# Patient Record
Sex: Female | Born: 1945
Health system: Southern US, Community
[De-identification: ages and names within clinical notes are randomized; demographics above are authoritative.]

## PROBLEM LIST (undated history)

## (undated) DIAGNOSIS — T7840XA Allergy, unspecified, initial encounter: Secondary | ICD-10-CM

## (undated) DIAGNOSIS — I10 Essential (primary) hypertension: Secondary | ICD-10-CM

## (undated) DIAGNOSIS — R0781 Pleurodynia: Secondary | ICD-10-CM

## (undated) DIAGNOSIS — J449 Chronic obstructive pulmonary disease, unspecified: Secondary | ICD-10-CM

## (undated) DIAGNOSIS — M2550 Pain in unspecified joint: Secondary | ICD-10-CM

## (undated) DIAGNOSIS — E039 Hypothyroidism, unspecified: Secondary | ICD-10-CM

## (undated) DIAGNOSIS — K5900 Constipation, unspecified: Secondary | ICD-10-CM

## (undated) DIAGNOSIS — H269 Unspecified cataract: Secondary | ICD-10-CM

## (undated) DIAGNOSIS — M797 Fibromyalgia: Secondary | ICD-10-CM

## (undated) DIAGNOSIS — S62109A Fracture of unspecified carpal bone, unspecified wrist, initial encounter for closed fracture: Secondary | ICD-10-CM

## (undated) DIAGNOSIS — H409 Unspecified glaucoma: Secondary | ICD-10-CM

## (undated) DIAGNOSIS — M549 Dorsalgia, unspecified: Secondary | ICD-10-CM

## (undated) DIAGNOSIS — F419 Anxiety disorder, unspecified: Secondary | ICD-10-CM

## (undated) DIAGNOSIS — K219 Gastro-esophageal reflux disease without esophagitis: Secondary | ICD-10-CM

## (undated) DIAGNOSIS — E7800 Pure hypercholesterolemia, unspecified: Secondary | ICD-10-CM

## (undated) DIAGNOSIS — M1990 Unspecified osteoarthritis, unspecified site: Secondary | ICD-10-CM

## (undated) DIAGNOSIS — M7989 Other specified soft tissue disorders: Secondary | ICD-10-CM

## (undated) DIAGNOSIS — M810 Age-related osteoporosis without current pathological fracture: Secondary | ICD-10-CM

## (undated) DIAGNOSIS — L718 Other rosacea: Secondary | ICD-10-CM

## (undated) DIAGNOSIS — H189 Unspecified disorder of cornea: Secondary | ICD-10-CM

## (undated) DIAGNOSIS — K635 Polyp of colon: Secondary | ICD-10-CM

## (undated) DIAGNOSIS — E785 Hyperlipidemia, unspecified: Secondary | ICD-10-CM

## (undated) HISTORY — DX: Fracture of unspecified carpal bone, unspecified wrist, initial encounter for closed fracture: S62.109A

## (undated) HISTORY — DX: Anxiety disorder, unspecified: F41.9

## (undated) HISTORY — PX: POLYPECTOMY: SHX149

## (undated) HISTORY — PX: EYE SURGERY: SHX253

## (undated) HISTORY — DX: Pleurodynia: R07.81

## (undated) HISTORY — DX: Gastro-esophageal reflux disease without esophagitis: K21.9

## (undated) HISTORY — DX: Hyperlipidemia, unspecified: E78.5

## (undated) HISTORY — DX: Constipation, unspecified: K59.00

## (undated) HISTORY — DX: Polyp of colon: K63.5

## (undated) HISTORY — DX: Unspecified cataract: H26.9

## (undated) HISTORY — PX: UPPER GASTROINTESTINAL ENDOSCOPY: SHX188

## (undated) HISTORY — DX: Fibromyalgia: M79.7

## (undated) HISTORY — DX: Pain in unspecified joint: M25.50

## (undated) HISTORY — DX: Other specified soft tissue disorders: M79.89

## (undated) HISTORY — DX: Age-related osteoporosis without current pathological fracture: M81.0

## (undated) HISTORY — DX: Other rosacea: L71.8

## (undated) HISTORY — DX: Dorsalgia, unspecified: M54.9

## (undated) HISTORY — DX: Essential (primary) hypertension: I10

## (undated) HISTORY — DX: Unspecified glaucoma: H40.9

## (undated) HISTORY — DX: Allergy, unspecified, initial encounter: T78.40XA

## (undated) HISTORY — PX: SPINE SURGERY: SHX786

## (undated) HISTORY — DX: Unspecified osteoarthritis, unspecified site: M19.90

## (undated) HISTORY — DX: Hypothyroidism, unspecified: E03.9

## (undated) HISTORY — PX: COLONOSCOPY: SHX174

## (undated) HISTORY — DX: Unspecified disorder of cornea: H18.9

## (undated) HISTORY — DX: Pure hypercholesterolemia, unspecified: E78.00

## (undated) HISTORY — PX: FRACTURE SURGERY: SHX138

## (undated) HISTORY — PX: TONSILLECTOMY: SUR1361

---

## 1998-04-19 ENCOUNTER — Other Ambulatory Visit: Admission: RE | Admit: 1998-04-19 | Discharge: 1998-04-19 | Payer: Self-pay | Admitting: Gynecology

## 2004-02-24 ENCOUNTER — Ambulatory Visit: Payer: Self-pay | Admitting: Internal Medicine

## 2004-02-24 ENCOUNTER — Inpatient Hospital Stay (HOSPITAL_COMMUNITY): Admission: EM | Admit: 2004-02-24 | Discharge: 2004-02-28 | Payer: Self-pay

## 2004-02-24 ENCOUNTER — Ambulatory Visit: Payer: Self-pay | Admitting: Pulmonary Disease

## 2004-02-25 ENCOUNTER — Encounter (INDEPENDENT_AMBULATORY_CARE_PROVIDER_SITE_OTHER): Payer: Self-pay | Admitting: *Deleted

## 2004-02-27 ENCOUNTER — Encounter (INDEPENDENT_AMBULATORY_CARE_PROVIDER_SITE_OTHER): Payer: Self-pay | Admitting: Specialist

## 2004-03-09 ENCOUNTER — Ambulatory Visit: Payer: Self-pay | Admitting: Pulmonary Disease

## 2004-03-18 ENCOUNTER — Ambulatory Visit (HOSPITAL_COMMUNITY): Admission: RE | Admit: 2004-03-18 | Discharge: 2004-03-18 | Payer: Self-pay | Admitting: Pulmonary Disease

## 2004-03-18 ENCOUNTER — Ambulatory Visit: Payer: Self-pay | Admitting: Pulmonary Disease

## 2004-03-24 ENCOUNTER — Ambulatory Visit: Payer: Self-pay | Admitting: Pulmonary Disease

## 2004-04-14 ENCOUNTER — Ambulatory Visit: Payer: Self-pay | Admitting: Pulmonary Disease

## 2004-05-26 ENCOUNTER — Ambulatory Visit: Payer: Self-pay | Admitting: Pulmonary Disease

## 2004-05-27 ENCOUNTER — Ambulatory Visit (HOSPITAL_COMMUNITY): Admission: RE | Admit: 2004-05-27 | Discharge: 2004-05-27 | Payer: Self-pay | Admitting: Pulmonary Disease

## 2004-05-27 ENCOUNTER — Ambulatory Visit: Payer: Self-pay | Admitting: Pulmonary Disease

## 2004-06-10 ENCOUNTER — Ambulatory Visit: Payer: Self-pay | Admitting: Pulmonary Disease

## 2004-06-21 ENCOUNTER — Ambulatory Visit (HOSPITAL_COMMUNITY): Admission: RE | Admit: 2004-06-21 | Discharge: 2004-06-21 | Payer: Self-pay | Admitting: Pulmonary Disease

## 2004-06-21 ENCOUNTER — Ambulatory Visit: Payer: Self-pay | Admitting: Pulmonary Disease

## 2004-06-27 ENCOUNTER — Encounter (INDEPENDENT_AMBULATORY_CARE_PROVIDER_SITE_OTHER): Payer: Self-pay | Admitting: *Deleted

## 2004-06-27 ENCOUNTER — Inpatient Hospital Stay (HOSPITAL_COMMUNITY): Admission: RE | Admit: 2004-06-27 | Discharge: 2004-06-30 | Payer: Self-pay | Admitting: Thoracic Surgery

## 2004-06-28 ENCOUNTER — Ambulatory Visit: Payer: Self-pay | Admitting: Internal Medicine

## 2004-07-06 ENCOUNTER — Encounter: Admission: RE | Admit: 2004-07-06 | Discharge: 2004-07-06 | Payer: Self-pay | Admitting: Thoracic Surgery

## 2004-07-13 ENCOUNTER — Ambulatory Visit: Payer: Self-pay | Admitting: Pulmonary Disease

## 2004-07-27 ENCOUNTER — Encounter: Admission: RE | Admit: 2004-07-27 | Discharge: 2004-07-27 | Payer: Self-pay | Admitting: Thoracic Surgery

## 2004-08-30 ENCOUNTER — Ambulatory Visit: Payer: Self-pay | Admitting: Internal Medicine

## 2004-09-15 ENCOUNTER — Ambulatory Visit: Payer: Self-pay | Admitting: Internal Medicine

## 2004-10-04 ENCOUNTER — Ambulatory Visit: Payer: Self-pay | Admitting: Pulmonary Disease

## 2004-10-20 ENCOUNTER — Other Ambulatory Visit: Admission: RE | Admit: 2004-10-20 | Discharge: 2004-10-20 | Payer: Self-pay | Admitting: Obstetrics and Gynecology

## 2004-12-19 ENCOUNTER — Ambulatory Visit: Payer: Self-pay | Admitting: Pulmonary Disease

## 2005-01-10 ENCOUNTER — Ambulatory Visit: Payer: Self-pay | Admitting: Pulmonary Disease

## 2005-01-12 ENCOUNTER — Ambulatory Visit: Payer: Self-pay | Admitting: Pulmonary Disease

## 2005-02-15 ENCOUNTER — Ambulatory Visit: Payer: Self-pay | Admitting: Internal Medicine

## 2005-05-24 ENCOUNTER — Ambulatory Visit: Payer: Self-pay | Admitting: Internal Medicine

## 2005-06-26 ENCOUNTER — Ambulatory Visit (HOSPITAL_COMMUNITY): Admission: RE | Admit: 2005-06-26 | Discharge: 2005-06-26 | Payer: Self-pay | Admitting: Orthopedic Surgery

## 2005-07-12 ENCOUNTER — Ambulatory Visit: Payer: Self-pay | Admitting: Pulmonary Disease

## 2005-07-28 ENCOUNTER — Ambulatory Visit: Payer: Self-pay | Admitting: Internal Medicine

## 2005-08-17 ENCOUNTER — Ambulatory Visit: Payer: Self-pay | Admitting: Pulmonary Disease

## 2005-09-20 ENCOUNTER — Ambulatory Visit: Payer: Self-pay | Admitting: Internal Medicine

## 2005-09-29 ENCOUNTER — Encounter: Admission: RE | Admit: 2005-09-29 | Discharge: 2005-09-29 | Payer: Self-pay | Admitting: Internal Medicine

## 2005-10-17 ENCOUNTER — Ambulatory Visit: Payer: Self-pay | Admitting: Internal Medicine

## 2005-10-31 ENCOUNTER — Encounter: Admission: RE | Admit: 2005-10-31 | Discharge: 2005-10-31 | Payer: Self-pay

## 2005-11-08 ENCOUNTER — Other Ambulatory Visit: Admission: RE | Admit: 2005-11-08 | Discharge: 2005-11-08 | Payer: Self-pay | Admitting: Gynecology

## 2005-11-23 ENCOUNTER — Encounter: Admission: RE | Admit: 2005-11-23 | Discharge: 2005-11-23 | Payer: Self-pay | Admitting: Gynecology

## 2005-12-05 ENCOUNTER — Ambulatory Visit (HOSPITAL_COMMUNITY): Admission: RE | Admit: 2005-12-05 | Discharge: 2005-12-05 | Payer: Self-pay | Admitting: *Deleted

## 2005-12-05 ENCOUNTER — Encounter (INDEPENDENT_AMBULATORY_CARE_PROVIDER_SITE_OTHER): Payer: Self-pay | Admitting: *Deleted

## 2006-01-05 ENCOUNTER — Ambulatory Visit: Payer: Self-pay | Admitting: Internal Medicine

## 2006-02-02 ENCOUNTER — Ambulatory Visit: Payer: Self-pay | Admitting: Pulmonary Disease

## 2006-02-21 ENCOUNTER — Ambulatory Visit: Payer: Self-pay | Admitting: Internal Medicine

## 2006-02-21 LAB — CONVERTED CEMR LAB
ALT: 14 units/L (ref 0–40)
AST: 15 units/L (ref 0–37)
Albumin: 3.5 g/dL (ref 3.5–5.2)
Alkaline Phosphatase: 52 units/L (ref 39–117)
BUN: 10 mg/dL (ref 6–23)
CO2: 32 meq/L (ref 19–32)
Calcium: 9.2 mg/dL (ref 8.4–10.5)
Chloride: 106 meq/L (ref 96–112)
Chol/HDL Ratio, serum: 3.6
Cholesterol: 190 mg/dL (ref 0–200)
Creatinine, Ser: 1 mg/dL (ref 0.4–1.2)
GFR calc non Af Amer: 60 mL/min
Glomerular Filtration Rate, Af Am: 73 mL/min/{1.73_m2}
Glucose, Bld: 71 mg/dL (ref 70–99)
HDL: 53.5 mg/dL (ref >39.0)
LDL Cholesterol: 110 mg/dL — ABNORMAL HIGH (ref 0–99)
Potassium: 3.7 meq/L (ref 3.5–5.1)
Sodium: 143 meq/L (ref 135–145)
Total Bilirubin: 0.8 mg/dL (ref 0.3–1.2)
Total Protein: 6.5 g/dL (ref 6.0–8.3)
Triglyceride fasting, serum: 133 mg/dL (ref 0–149)
VLDL: 27 mg/dL (ref 0–40)

## 2006-03-06 ENCOUNTER — Emergency Department (HOSPITAL_COMMUNITY): Admission: EM | Admit: 2006-03-06 | Discharge: 2006-03-06 | Payer: Self-pay | Admitting: Emergency Medicine

## 2006-03-09 ENCOUNTER — Ambulatory Visit: Payer: Self-pay | Admitting: Internal Medicine

## 2006-05-15 DIAGNOSIS — Z860100 Personal history of colon polyps, unspecified: Secondary | ICD-10-CM | POA: Insufficient documentation

## 2006-05-15 DIAGNOSIS — R079 Chest pain, unspecified: Secondary | ICD-10-CM

## 2006-05-15 DIAGNOSIS — E039 Hypothyroidism, unspecified: Secondary | ICD-10-CM | POA: Insufficient documentation

## 2006-05-15 DIAGNOSIS — G8929 Other chronic pain: Secondary | ICD-10-CM | POA: Insufficient documentation

## 2006-10-23 ENCOUNTER — Encounter: Payer: Self-pay | Admitting: Internal Medicine

## 2006-11-07 ENCOUNTER — Ambulatory Visit: Payer: Self-pay | Admitting: Internal Medicine

## 2006-11-13 ENCOUNTER — Other Ambulatory Visit: Admission: RE | Admit: 2006-11-13 | Discharge: 2006-11-13 | Payer: Self-pay | Admitting: Gynecology

## 2006-11-20 ENCOUNTER — Telehealth (INDEPENDENT_AMBULATORY_CARE_PROVIDER_SITE_OTHER): Payer: Self-pay | Admitting: *Deleted

## 2006-11-26 ENCOUNTER — Encounter: Admission: RE | Admit: 2006-11-26 | Discharge: 2006-11-26 | Payer: Self-pay | Admitting: Gynecology

## 2007-03-04 ENCOUNTER — Ambulatory Visit: Payer: Self-pay | Admitting: Internal Medicine

## 2007-03-04 DIAGNOSIS — G43009 Migraine without aura, not intractable, without status migrainosus: Secondary | ICD-10-CM | POA: Insufficient documentation

## 2007-03-08 LAB — CONVERTED CEMR LAB
BUN: 11 mg/dL (ref 6–23)
Basophils Relative: 0.8 % (ref 0.0–1.0)
CO2: 29 meq/L (ref 19–32)
Creatinine, Ser: 0.8 mg/dL (ref 0.4–1.2)
Direct LDL: 92 mg/dL
Eosinophils Relative: 1 % (ref 0.0–5.0)
GFR calc Af Amer: 94 mL/min
Glucose, Bld: 75 mg/dL (ref 70–99)
HCT: 40.5 % (ref 36.0–46.0)
Hemoglobin: 13.6 g/dL (ref 12.0–15.0)
Lymphocytes Relative: 21.9 % (ref 12.0–46.0)
MCHC: 33.6 g/dL (ref 30.0–36.0)
MCV: 94.3 fL (ref 78.0–100.0)
Monocytes Absolute: 0.8 10*3/uL — ABNORMAL HIGH (ref 0.2–0.7)
Monocytes Relative: 10.5 % (ref 3.0–11.0)
Neutrophils Relative %: 65.8 % (ref 43.0–77.0)
Potassium: 3.5 meq/L (ref 3.5–5.1)
RDW: 12.8 % (ref 11.5–14.6)
TSH: 0.83 microintl units/mL (ref 0.35–5.50)
Total CHOL/HDL Ratio: 3.6
VLDL: 42 mg/dL — ABNORMAL HIGH (ref 0–40)

## 2007-03-13 ENCOUNTER — Telehealth (INDEPENDENT_AMBULATORY_CARE_PROVIDER_SITE_OTHER): Payer: Self-pay | Admitting: *Deleted

## 2007-09-09 ENCOUNTER — Encounter: Payer: Self-pay | Admitting: Internal Medicine

## 2007-10-29 ENCOUNTER — Ambulatory Visit: Payer: Self-pay | Admitting: Internal Medicine

## 2007-10-29 ENCOUNTER — Ambulatory Visit: Payer: Self-pay | Admitting: Cardiology

## 2007-10-29 DIAGNOSIS — F329 Major depressive disorder, single episode, unspecified: Secondary | ICD-10-CM | POA: Insufficient documentation

## 2007-10-29 LAB — CONVERTED CEMR LAB
Bilirubin Urine: NEGATIVE
Glucose, Urine, Semiquant: NEGATIVE
pH: 5

## 2007-10-30 ENCOUNTER — Telehealth: Payer: Self-pay | Admitting: Internal Medicine

## 2007-10-30 ENCOUNTER — Encounter: Payer: Self-pay | Admitting: Internal Medicine

## 2007-10-31 ENCOUNTER — Encounter: Payer: Self-pay | Admitting: Internal Medicine

## 2007-10-31 LAB — CONVERTED CEMR LAB: RBC / HPF: NONE SEEN

## 2007-11-11 ENCOUNTER — Telehealth (INDEPENDENT_AMBULATORY_CARE_PROVIDER_SITE_OTHER): Payer: Self-pay | Admitting: *Deleted

## 2007-11-21 ENCOUNTER — Encounter: Payer: Self-pay | Admitting: Internal Medicine

## 2007-11-21 LAB — CONVERTED CEMR LAB
Cholesterol: 201 mg/dL
Total CHOL/HDL Ratio: 3.5

## 2007-11-25 ENCOUNTER — Encounter: Payer: Self-pay | Admitting: Internal Medicine

## 2007-11-27 ENCOUNTER — Encounter: Admission: RE | Admit: 2007-11-27 | Discharge: 2007-11-27 | Payer: Self-pay | Admitting: Gynecology

## 2007-11-28 ENCOUNTER — Telehealth (INDEPENDENT_AMBULATORY_CARE_PROVIDER_SITE_OTHER): Payer: Self-pay | Admitting: *Deleted

## 2007-12-06 ENCOUNTER — Ambulatory Visit: Payer: Self-pay | Admitting: Internal Medicine

## 2007-12-11 ENCOUNTER — Telehealth: Payer: Self-pay | Admitting: Internal Medicine

## 2007-12-11 LAB — CONVERTED CEMR LAB
AST: 14 units/L (ref 0–37)
Albumin: 4.3 g/dL (ref 3.5–5.2)
Anti Nuclear Antibody(ANA): NEGATIVE
Bilirubin, Direct: 0.1 mg/dL (ref 0.0–0.3)
Sed Rate: 20 mm/hr (ref 0–22)
TSH: 1.271 microintl units/mL (ref 0.350–4.50)

## 2008-01-22 ENCOUNTER — Ambulatory Visit: Payer: Self-pay | Admitting: Internal Medicine

## 2008-02-10 ENCOUNTER — Encounter: Payer: Self-pay | Admitting: Internal Medicine

## 2008-02-25 ENCOUNTER — Encounter: Payer: Self-pay | Admitting: Internal Medicine

## 2008-04-09 ENCOUNTER — Ambulatory Visit: Payer: Self-pay | Admitting: Internal Medicine

## 2008-04-10 HISTORY — PX: OTHER SURGICAL HISTORY: SHX169

## 2008-04-20 ENCOUNTER — Encounter (INDEPENDENT_AMBULATORY_CARE_PROVIDER_SITE_OTHER): Payer: Self-pay | Admitting: *Deleted

## 2008-04-20 LAB — CONVERTED CEMR LAB
Basophils Absolute: 0.1 10*3/uL (ref 0.0–0.1)
Creatinine, Ser: 0.9 mg/dL (ref 0.4–1.2)
Eosinophils Absolute: 0.2 10*3/uL (ref 0.0–0.7)
Glucose, Bld: 81 mg/dL (ref 70–99)
HCT: 37 % (ref 36.0–46.0)
HDL: 57.3 mg/dL (ref >39.0)
LDL Cholesterol: 106 mg/dL — ABNORMAL HIGH (ref 0–99)
Lymphocytes Relative: 26.2 % (ref 12.0–46.0)
MCHC: 33.8 g/dL (ref 30.0–36.0)
Monocytes Absolute: 1 10*3/uL (ref 0.1–1.0)
Neutro Abs: 5.3 10*3/uL (ref 1.4–7.7)
Platelets: 269 10*3/uL (ref 150–400)
Potassium: 3.7 meq/L (ref 3.5–5.1)
RBC: 4.16 M/uL (ref 3.87–5.11)
Sodium: 145 meq/L (ref 135–145)
VLDL: 31 mg/dL (ref 0–40)
WBC: 9 10*3/uL (ref 4.5–10.5)

## 2008-07-29 ENCOUNTER — Encounter: Payer: Self-pay | Admitting: Internal Medicine

## 2008-07-29 LAB — CONVERTED CEMR LAB
ALT: 12 units/L
Calcium: 10.6 mg/dL
TSH: 0.2 microintl units/mL

## 2008-08-13 ENCOUNTER — Encounter: Payer: Self-pay | Admitting: Internal Medicine

## 2008-09-30 ENCOUNTER — Ambulatory Visit: Payer: Self-pay | Admitting: Internal Medicine

## 2008-09-30 DIAGNOSIS — D179 Benign lipomatous neoplasm, unspecified: Secondary | ICD-10-CM | POA: Insufficient documentation

## 2008-10-28 ENCOUNTER — Telehealth (INDEPENDENT_AMBULATORY_CARE_PROVIDER_SITE_OTHER): Payer: Self-pay | Admitting: *Deleted

## 2008-11-10 ENCOUNTER — Encounter: Payer: Self-pay | Admitting: Internal Medicine

## 2008-11-16 ENCOUNTER — Ambulatory Visit: Payer: Self-pay | Admitting: Internal Medicine

## 2008-11-20 ENCOUNTER — Encounter (INDEPENDENT_AMBULATORY_CARE_PROVIDER_SITE_OTHER): Payer: Self-pay | Admitting: *Deleted

## 2008-11-20 LAB — CONVERTED CEMR LAB
Cholesterol: 167 mg/dL (ref 0–200)
Total CHOL/HDL Ratio: 4
Triglycerides: 128 mg/dL (ref 0.0–149.0)
VLDL: 25.6 mg/dL (ref 0.0–40.0)

## 2008-11-24 ENCOUNTER — Ambulatory Visit: Payer: Self-pay | Admitting: Gastroenterology

## 2008-11-27 ENCOUNTER — Ambulatory Visit (HOSPITAL_BASED_OUTPATIENT_CLINIC_OR_DEPARTMENT_OTHER): Admission: RE | Admit: 2008-11-27 | Discharge: 2008-11-27 | Payer: Self-pay | Admitting: Gynecology

## 2008-11-27 ENCOUNTER — Ambulatory Visit: Payer: Self-pay | Admitting: Diagnostic Radiology

## 2008-11-30 ENCOUNTER — Ambulatory Visit: Payer: Self-pay | Admitting: Gastroenterology

## 2008-11-30 ENCOUNTER — Encounter: Payer: Self-pay | Admitting: Gastroenterology

## 2008-12-09 ENCOUNTER — Encounter: Payer: Self-pay | Admitting: Gastroenterology

## 2009-01-29 ENCOUNTER — Ambulatory Visit: Payer: Self-pay | Admitting: Internal Medicine

## 2009-01-29 DIAGNOSIS — M797 Fibromyalgia: Secondary | ICD-10-CM | POA: Insufficient documentation

## 2009-02-01 ENCOUNTER — Encounter (INDEPENDENT_AMBULATORY_CARE_PROVIDER_SITE_OTHER): Payer: Self-pay | Admitting: *Deleted

## 2009-02-10 ENCOUNTER — Encounter: Admission: RE | Admit: 2009-02-10 | Discharge: 2009-03-24 | Payer: Self-pay | Admitting: Internal Medicine

## 2009-02-12 ENCOUNTER — Encounter: Payer: Self-pay | Admitting: Internal Medicine

## 2009-03-10 DIAGNOSIS — L718 Other rosacea: Secondary | ICD-10-CM

## 2009-03-10 HISTORY — DX: Other rosacea: L71.8

## 2009-04-14 ENCOUNTER — Telehealth: Payer: Self-pay | Admitting: Internal Medicine

## 2009-05-07 ENCOUNTER — Telehealth (INDEPENDENT_AMBULATORY_CARE_PROVIDER_SITE_OTHER): Payer: Self-pay | Admitting: *Deleted

## 2009-05-20 ENCOUNTER — Telehealth (INDEPENDENT_AMBULATORY_CARE_PROVIDER_SITE_OTHER): Payer: Self-pay | Admitting: *Deleted

## 2009-05-26 ENCOUNTER — Ambulatory Visit: Payer: Self-pay | Admitting: Internal Medicine

## 2009-05-27 ENCOUNTER — Encounter: Payer: Self-pay | Admitting: Internal Medicine

## 2009-05-31 LAB — CONVERTED CEMR LAB
AST: 17 units/L (ref 0–37)
Albumin: 3.7 g/dL (ref 3.5–5.2)
Basophils Absolute: 0 10*3/uL (ref 0.0–0.1)
Basophils Relative: 0.7 % (ref 0.0–3.0)
Chloride: 105 meq/L (ref 96–112)
Creatinine, Ser: 0.6 mg/dL (ref 0.4–1.2)
Hemoglobin: 12.9 g/dL (ref 12.0–15.0)
Lymphocytes Relative: 25.2 % (ref 12.0–46.0)
Lymphs Abs: 1.3 10*3/uL (ref 0.7–4.0)
MCHC: 34.1 g/dL (ref 30.0–36.0)
Monocytes Relative: 9.4 % (ref 3.0–12.0)
Neutrophils Relative %: 62.3 % (ref 43.0–77.0)
RDW: 13.3 % (ref 11.5–14.6)
Sodium: 141 meq/L (ref 135–145)
WBC: 5.2 10*3/uL (ref 4.5–10.5)

## 2009-06-14 ENCOUNTER — Telehealth (INDEPENDENT_AMBULATORY_CARE_PROVIDER_SITE_OTHER): Payer: Self-pay | Admitting: *Deleted

## 2009-07-15 ENCOUNTER — Telehealth: Payer: Self-pay | Admitting: Internal Medicine

## 2009-10-12 ENCOUNTER — Encounter: Payer: Self-pay | Admitting: Internal Medicine

## 2009-10-13 ENCOUNTER — Telehealth: Payer: Self-pay | Admitting: Internal Medicine

## 2009-10-16 ENCOUNTER — Ambulatory Visit: Payer: Self-pay | Admitting: Diagnostic Radiology

## 2009-10-16 ENCOUNTER — Ambulatory Visit (HOSPITAL_BASED_OUTPATIENT_CLINIC_OR_DEPARTMENT_OTHER): Admission: RE | Admit: 2009-10-16 | Discharge: 2009-10-16 | Payer: Self-pay | Admitting: Orthopaedic Surgery

## 2009-10-16 ENCOUNTER — Encounter: Payer: Self-pay | Admitting: Internal Medicine

## 2009-10-26 ENCOUNTER — Ambulatory Visit: Payer: Self-pay | Admitting: Internal Medicine

## 2009-11-08 ENCOUNTER — Encounter: Payer: Self-pay | Admitting: Internal Medicine

## 2009-11-08 LAB — CONVERTED CEMR LAB: Pap Smear: NORMAL

## 2009-11-17 ENCOUNTER — Encounter: Admission: RE | Admit: 2009-11-17 | Discharge: 2010-02-15 | Payer: Self-pay | Admitting: Orthopaedic Surgery

## 2009-12-10 ENCOUNTER — Ambulatory Visit (HOSPITAL_BASED_OUTPATIENT_CLINIC_OR_DEPARTMENT_OTHER): Admission: RE | Admit: 2009-12-10 | Discharge: 2009-12-10 | Payer: Self-pay | Admitting: Gynecology

## 2009-12-10 ENCOUNTER — Ambulatory Visit: Payer: Self-pay | Admitting: Diagnostic Radiology

## 2009-12-28 ENCOUNTER — Encounter: Payer: Self-pay | Admitting: Internal Medicine

## 2009-12-29 ENCOUNTER — Encounter: Payer: Self-pay | Admitting: Internal Medicine

## 2010-01-10 ENCOUNTER — Telehealth: Payer: Self-pay | Admitting: Internal Medicine

## 2010-02-07 ENCOUNTER — Ambulatory Visit: Payer: Self-pay | Admitting: Internal Medicine

## 2010-04-06 ENCOUNTER — Telehealth: Payer: Self-pay | Admitting: Internal Medicine

## 2010-04-11 ENCOUNTER — Encounter
Admission: RE | Admit: 2010-04-11 | Discharge: 2010-05-09 | Payer: Self-pay | Source: Home / Self Care | Attending: Orthopaedic Surgery | Admitting: Orthopaedic Surgery

## 2010-04-25 ENCOUNTER — Encounter (INDEPENDENT_AMBULATORY_CARE_PROVIDER_SITE_OTHER): Payer: Self-pay | Admitting: *Deleted

## 2010-04-25 ENCOUNTER — Telehealth: Payer: Self-pay | Admitting: Internal Medicine

## 2010-05-01 ENCOUNTER — Encounter: Payer: Self-pay | Admitting: Gynecology

## 2010-05-08 LAB — CONVERTED CEMR LAB
Creatinine, Ser: 0.72 mg/dL
Glucose, Bld: 129 mg/dL
Hemoglobin: 4.53 g/dL
Platelets: 61 10*3/uL
Potassium, serum: 4.2 mmol/L
Rhuematoid fact SerPl-aCnc: >20 intl units/mL
WBC, blood: 8 10*3/uL

## 2010-05-10 NOTE — Assessment & Plan Note (Signed)
Summary: CPX//PH   Vital Signs:  Patient profile:   65 year old female Height:      67 inches Weight:      156.2 pounds BMI:     24.55 Pulse rate:   70 / minute BP sitting:   120 / 80  Vitals Entered By: Shary Decamp (May 26, 2009 9:02 AM) CC: cpx - fasting, has gyn Is Patient Diabetic? No   History of Present Illness: dx w/ ocular rosacea, on abx indefinitely  Osteopenia-- holding boniva , took it x  many years, aches&pains  decreased by holding it?  Colonic polyps-- chart reviewed , had a cscope recently    Depression-- mood ok   fibromyalgia --  s/p PT , did help but it was expensive PT hurt his shoulder, s/p local njection which helped   Preventive Screening-Counseling & Management  Caffeine-Diet-Exercise     Does Patient Exercise: no      Drug Use:  no.    Current Medications (verified): 1)  Sumatriptan Succinate 100 Mg Tabs (Sumatriptan Succinate) .Marland Kitchen.. 1 By Mouth With Onset of Ha, Repeat in 2 Hours If Needed. No More Than 2 Tabs in A 24h Period 2)  Synthroid 88 Mcg Tabs (Levothyroxine Sodium) .Marland Kitchen.. 1 By Mouth Once Daily 3)  Vicodin 5-500 Mg Tabs (Hydrocodone-Acetaminophen) .... Take 1 Tablet By Mouth Every Six Hours 4)  Xanax 0.25 Mg Tabs (Alprazolam) .... Take 1 Tablet By Mouth Twice A Day As Needed 5)  Prilosec Otc 20 Mg  Tbec (Omeprazole Magnesium) .... Qd 6)  Aspir-Low 81 Mg  Tbec (Aspirin) 7)  Mvi 8)  Vit C, Cal - D 9)  Premarin 0.625 Mg/gm  Crea (Estrogens, Conjugated) .... Qwk 10)  Boniva 150 Mg  Tabs (Ibandronate Sodium) .... Hold Hold Q 4 Week 11)  Red Yeast Rice 12)  Ambien Cr 6.25 Mg Cr-Tabs (Zolpidem Tartrate) .Marland Kitchen.. 1 By Mouth At Bedtime 13)  Cranberry Pill 14)  Nortriptyline Hcl 50 Mg Caps (Nortriptyline Hcl) .Marland Kitchen.. 1 At Bedtime 15)  Doxycycline Hyclate 100 Mg Caps (Doxycycline Hyclate) .Marland Kitchen.. 1 By Mouth Once Daily  Allergies (verified): 1)  ! * Savella 2)  * Penicillins Group 3)  * Contrast Dye 4)  Fosamax (Alendronate Sodium)  Past  History:  Past Medical History: Hypothyroidism migraine HA Osteopenia Colonic polyps, C-scope 11-2005: Bx adenomatous polyps, next scope 2010 Chronic pleuritic CP, etiology unclear; has been eval by pulmonary and Rheumatology (autoinmune Dz?) Depression 6-09: CP, stress test @ [facility] (-)  fibromyalgia - dx by dr Phylliss Bob  dx w/ ocular rosacea (Dx 03-2009)  Past Surgical History: Reviewed history from 11/24/2008 and no changes required. Tonsillectomy   Family History: Reviewed history from 11/24/2008 and no changes required. unknown, patient adopted    Social History: Reviewed history from 11/24/2008 and no changes required. Married no biological children Retired, Marketing executive-- a lot better , continue to loose wt  exercise -- some  tobacco-- quit 2005 aprox ETOH-- rarely  Does Patient Exercise:  no Drug Use:  no  Review of Systems CV:  Denies chest pain or discomfort and swelling of feet. Resp:  Denies cough and shortness of breath. GI:  Denies bloody stools, nausea, and vomiting. GU:  Denies dysuria, hematuria, urinary frequency, and urinary hesitancy.  Physical Exam  General:  alert, well-developed, and well-nourished.   Neck:  no thyromegaly.   Lungs:  normal respiratory effort, no intercostal retractions, no accessory muscle use, and normal breath sounds.  Heart:  normal rate, regular rhythm, and no murmur.   Abdomen:  soft, non-tender, no distention, no masses, no guarding, and no rigidity.   Extremities:  no edema Psych:  Cognition and judgment appear intact. Alert and cooperative with normal attention span and concentration.  not anxious appearing and not depressed appearing.     Impression & Recommendations:  Problem # 1:  OSTEOPENIA (ICD-733.90) last bone density test 02/2008 by gynecology we were holding boniva to see if her aches and pains improve and apparently  they did  to some extent so  we are stopping boniva today  (she has  been taking it for many year , this would be a holiday) planning to get a bone density test at some point per gynecology  The following medications were removed from the medication list:    Boniva 150 Mg Tabs (Ibandronate sodium) ..... Hold hold q 4 week  Orders: Venipuncture (08657) TLB-BMP (Basic Metabolic Panel-BMET) (80048-METABOL)  Problem # 2:  HEALTH SCREENING (ICD-V70.0) TD 2007 Pneumonia shot 2006 per patient had a flu shot  Doing great with her diet, she continued to lose weight and being active   Cscope 11-2005:  polyps-adenomatous, repeated  colonoscopy 11/2008, next 2013  sees Gyn-- lomax Pap Smear:   Date:  01/08/2009   Results:  normal  Mammogram:   Date:  11/08/2008   Results:  normal   Problem # 3:  FIBROMYALGIA (ICD-729.1) currently well controlled, status post physical therapy, see HPI she is taking nortriptyline she will meet her new rheumatologist today Her updated medication list for this problem includes:    Vicodin 5-500 Mg Tabs (Hydrocodone-acetaminophen) .Marland Kitchen... Take 1 tablet by mouth every six hours    Aspir-low 81 Mg Tbec (Aspirin)  Orders: TLB-CBC Platelet - w/Differential (85025-CBCD) TLB-Hepatic/Liver Function Pnl (80076-HEPATIC)  Problem # 4:  DEPRESSION (ICD-311) well-controlled Her updated medication list for this problem includes:    Nortriptyline Hcl 50 Mg Caps (Nortriptyline hcl) .Marland Kitchen... 1 at bedtime    Xanax 0.25 Mg Tabs (Alprazolam) .Marland Kitchen... Take 1 tablet by mouth twice a day as needed  Problem # 5:  HYPOTHYROIDISM (ICD-244.9) at goal  Her updated medication list for this problem includes:    Synthroid 88 Mcg Tabs (Levothyroxine sodium) .Marland Kitchen... 1 by mouth once daily  Labs Reviewed: TSH: 1.59 (01/29/2009)    Chol: 167 (11/16/2008)   HDL: 42.70 (11/16/2008)   LDL: 99 (11/16/2008)   TG: 128.0 (11/16/2008)  Orders: TLB-TSH (Thyroid Stimulating Hormone) (84443-TSH)  Complete Medication List: 1)  Nortriptyline Hcl 50 Mg Caps (Nortriptyline  hcl) .Marland Kitchen.. 1 at bedtime 2)  Ambien Cr 6.25 Mg Cr-tabs (Zolpidem tartrate) .Marland Kitchen.. 1 by mouth at bedtime 3)  Sumatriptan Succinate 100 Mg Tabs (Sumatriptan succinate) .Marland Kitchen.. 1 by mouth with onset of ha, repeat in 2 hours if needed. no more than 2 tabs in a 24h period 4)  Synthroid 88 Mcg Tabs (Levothyroxine sodium) .Marland Kitchen.. 1 by mouth once daily 5)  Vicodin 5-500 Mg Tabs (Hydrocodone-acetaminophen) .... Take 1 tablet by mouth every six hours 6)  Xanax 0.25 Mg Tabs (Alprazolam) .... Take 1 tablet by mouth twice a day as needed 7)  Prilosec Otc 20 Mg Tbec (Omeprazole magnesium) .... Qd 8)  Aspir-low 81 Mg Tbec (Aspirin) 9)  Mvi  10)  Vit C, Cal - D  11)  Premarin 0.625 Mg/gm Crea (Estrogens, conjugated) .... Qwk 12)  Red Yeast Rice  13)  Cranberry Pill  14)  Doxycycline Hyclate 100 Mg Caps (Doxycycline hyclate) .Marland KitchenMarland KitchenMarland Kitchen  1 by mouth once daily  Patient Instructions: 1)  Please schedule a follow-up appointment in 6 months .    Preventive Care Screening  Pap Smear:    Date:  01/08/2009    Results:  normal   Mammogram:    Date:  11/08/2008    Results:  normal   Bone Density:    Date:  02/25/2008    Results:  osteopenia std dev  Prior Values:    Colonoscopy:  Location:  [facility].   (11/30/2008)    Last Tetanus Booster:  Td (02/08/2006)    Risk Factors:  Drug use:  no Alcohol use:  no Exercise:  no  PAP Smear History:     Date of Last PAP Smear:  01/08/2009    Results:  normal   Mammogram History:     Date of Last Mammogram:  11/08/2008    Results:  normal      Immunization History:  Tetanus/Td Immunization History:    Tetanus/Td:  Td (02/08/2006)  Influenza Immunization History:    Influenza:  Fluvax 3+ (01/29/2009)  Pneumovax Immunization History:    Pneumovax:  Pneumovax (05/11/2004)  H1N1 History:    H1N1 # 1:  H1N1 vaccine G code (04/09/2008)

## 2010-05-10 NOTE — Progress Notes (Signed)
Summary: REFILL REQUEST  Phone Note Refill Request Call back at [phone] Message from:  Pharmacy on October 13, 2009 4:09 PM  Refills Requested: Medication #1:  AMBIEN CR 6.25 MG CR-TABS 1 by mouth at bedtime   Dosage confirmed as above?Dosage Confirmed   Supply Requested: 1 month   Last Refilled: 04/14/2009  Medication #2:  VICODIN 5-500 MG TABS Take 1 tablet by mouth every six hours   Dosage confirmed as above?Dosage Confirmed   Supply Requested: 1 month   Last [facility]: [facility]14/[facility]  Next Appointment Scheduled: JULY 19TH 2011 Initial call taken by: Lavell Islam,  October 13, 2009 4:11 PM  Follow-up for Phone Call        last ov- 05/26/09. Army Fossa CMA  October 13, 2009 4:13 PM   Additional Follow-up for Phone Call Additional follow up Details #1::        ok ambien 30 , 6 Rf ok vicodin 100 and 2 Rf Shaul Trautman E. Kira Hartl MD  October 14, 2009 1:53 PM     Prescriptions: VICODIN 5-500 MG TABS (HYDROCODONE-ACETAMINOPHEN) Take 1 tablet by mouth every six hours  #100 x 2   Entered by:   Army Fossa CMA   Authorized by:   Nolon Rod. Hurshell Dino MD   Signed by:   Army Fossa CMA on 10/14/2009   Method used:   Printed then faxed to ...       [facility] [phone]* (retail)       [address]       [place], [place]  [address]       Ph: [phone]       Fax: [phone]   RxID:   [id number] AMBIEN CR 6.25 MG CR-TABS (ZOLPIDEM TARTRATE) 1 by mouth at bedtime  #30 x 6   Entered by:   Army Fossa CMA   Authorized by:   Nolon Rod. [place] Dube MD   Signed by:   Army Fossa CMA on 10/14/2009   Method used:   Printed then faxed to ...       [facility] [phone]* (retail)       [address]       [place], [place]  [address]       Ph: [phone]       Fax: [phone]   RxID:   [id number]

## 2010-05-10 NOTE — Progress Notes (Signed)
Summary: refill  Phone Note Refill Request Message from:  Fax from Pharmacy on January 10, 2010 4:06 PM  Refills Requested: Medication #1:  VICODIN 5-500 MG TABS Take 1 tablet by mouth every six hours [facility] [phone]  Initial call taken by: Okey Regal [place],  January 10, 2010 4:07 PM  Follow-up for Phone Call        last filled 10/14/09 100x 2 Follow-up by: Army Fossa CMA,  January 10, 2010 4:12 PM  Additional Follow-up for Phone Call Additional follow up Details #1::        okay #100 and 2 refills Additional Follow-up by: [facility] E. Paz MD,  January 11, 2010 3:07 PM    Prescriptions: VICODIN 5-500 MG TABS (HYDROCODONE-ACETAMINOPHEN) Take 1 tablet by mouth every six hours  #100 x 2   Entered by:   Army Fossa CMA   Authorized by:   Nolon Rod. Paz MD   Signed by:   Army Fossa CMA on 01/11/2010   Method used:   Printed then faxed to ...       [facility] [phone]* (retail)       [address]       [place], [place]  [address]       Ph: [phone]       Fax: [phone]   RxID:   [id number]

## 2010-05-10 NOTE — Assessment & Plan Note (Signed)
Summary: FLU SHOT//PH   Nurse Visit   Allergies: 1)  ! * Savella 2)  * Penicillins Group 3)  * Contrast Dye 4)  Fosamax (Alendronate Sodium)  Orders Added: 1)  Admin 1st Vaccine [90471] 2)  Flu Vaccine 74yrs + [81191] Flu Vaccine Consent Questions     Do you have a history of severe allergic reactions to this vaccine? no    Any prior history of allergic reactions to egg and/or gelatin? no    Do you have a sensitivity to the preservative Thimersol? no    Do you have a past history of Guillan-Barre Syndrome? no    Do you currently have an acute febrile illness? no    Have you ever had a severe reaction to latex? no    Vaccine information given and explained to patient? yes    Are you currently pregnant? no    Lot Number:[id number]   Exp Date:10/08/2010   Site Given  Left Deltoid IM .lbflu1

## 2010-05-10 NOTE — Progress Notes (Signed)
Summary: refill  Phone Note Refill Request Message from:  Fax from Pharmacy on July 15, 2009 11:30 AM  Refills Requested: Medication #1:  VICODIN 5-500 MG TABS Take 1 tablet by mouth every six hours [facility] [address] fax [phone]   Method Requested: Fax to Local Pharmacy Next Appointment Scheduled: no appt Initial call taken by: Barb Merino,  July 15, 2009 11:31 AM  Follow-up for Phone Call        last ov 05/26/09 rx'd #100 with 1 refill on 04/14/09 [facility]  July 15, 2009 2:49 PM  #100 and 2 RF Kendi Defalco E. Bree Heinzelman MD  July 16, 2009 8:25 AM     Prescriptions: VICODIN 5-500 MG TABS (HYDROCODONE-ACETAMINOPHEN) Take 1 tablet by mouth every six hours  #100 x 2   Entered by:   Shary Decamp   Authorized by:   Nolon Rod. Satrina Magallanes MD   Signed by:   Shary Decamp on 07/16/2009   Method used:   Printed then faxed to ...       [facility] [phone]* (retail)       [address]       [place], [place]  [address]       Ph: [phone]       Fax: [phone]   RxID:   [id number]

## 2010-05-10 NOTE — Progress Notes (Signed)
Summary:  prior auth NOT required  Phone Note Refill Request Message from:  [facility] Pharmacy [facility] [phone]  Refills Requested: Medication #1:  repeat in 2 hours if needed. No more than 2 tabs in a 24h period PRIOR AUTH [id number]  Initial call taken by: Barb Merino,  May 20, 2009 8:33 AM  Follow-up for Phone Call        prior Berkley Harvey is Not required, ins only allows #9 in a 30 day period,  per ins co pt filled  #9 on 05/18/09.spoke with pharmacist and informed of this. per Alena Bills pt has been informed of this.Kandice Hams  May 20, 2009 1:10 PM  Follow-up by: Kandice Hams,  May 20, 2009 1:10 PM

## 2010-05-10 NOTE — Progress Notes (Signed)
Summary: prior authorization NOT REQUIRED  Phone Note Refill Request Message from:  Fax from Pharmacy on [address] fax [phone]  Refills Requested: Medication #1:  SUMATRIPTAN SUCCINATE 100 MG TABS 1 by mouth with onset of HA prior authorization [phone]  Initial call taken by: Barb Merino,  May 07, 2009 4:58 PM  Follow-up for Phone Call        Per BC/BS pt can #9  IN A 30 DAY PERIOD, WITHOUT PRIOR AUTH-  [facility] Follow-up by: Kandice Hams,  May 10, 2009 2:33 PM

## 2010-05-10 NOTE — Assessment & Plan Note (Signed)
Summary: 6 month --ph   Vital Signs:  Patient profile:   65 year old female Weight:      150.50 pounds Pulse rate:   104 / minute Pulse rhythm:   regular BP sitting:   116 / 84  (left arm) Cuff size:   regular  Vitals Entered By: Army Fossa CMA (October 26, 2009 10:30 AM) CC: Follow up visit- Fasting Comments Discuss Glucose Levels.   History of Present Illness: routine office visit doing well diet better-- lost 6 pounds  ortho did some blood work, all normal including sed rate  to have shoulder surgery (Dr Cleophas Dunker)--- rotator cuff   ROS   denies chest pain or shortness of breath fibromyalgia symptoms well controlled Anxiety and depression well controlled Good medication compliance Things she's having nightmares from nortriptyline but as long as she takes Ambien she is okay  Current Medications (verified): 1)  Nortriptyline Hcl 50 Mg Caps (Nortriptyline Hcl) .Marland Kitchen.. 1 At Bedtime 2)  Ambien Cr 6.25 Mg Cr-Tabs (Zolpidem Tartrate) .Marland Kitchen.. 1 By Mouth At Bedtime 3)  Sumatriptan Succinate 100 Mg Tabs (Sumatriptan Succinate) .Marland Kitchen.. 1 By Mouth With Onset of Ha, Repeat in 2 Hours If Needed. No More Than 2 Tabs in A 24h Period 4)  Synthroid 88 Mcg Tabs (Levothyroxine Sodium) .Marland Kitchen.. 1 By Mouth Once Daily 5)  Vicodin 5-500 Mg Tabs (Hydrocodone-Acetaminophen) .... Take 1 Tablet By Mouth Every Six Hours 6)  Xanax 0.25 Mg Tabs (Alprazolam) .... Take 1 Tablet By Mouth Twice A Day As Needed 7)  Mvi 8)  Vit C, Cal - D 9)  Premarin 0.625 Mg/gm  Crea (Estrogens, Conjugated) .... Qwk 10)  Red Yeast Rice 11)  Cranberry Pill 12)  Doxycycline Hyclate 100 Mg Caps (Doxycycline Hyclate) .Marland Kitchen.. 1 By Mouth Once Daily  Allergies: 1)  ! * Savella 2)  * Penicillins Group 3)  * Contrast Dye 4)  Fosamax (Alendronate Sodium)  Past History:  Past Medical History: Hypothyroidism migraine HA Osteopenia Colonic polyps, C-scope 11-2005: Bx adenomatous polyps, next scope 2010 Chronic pleuritic CP, etiology  unclear; has been eval by pulmonary and Rheumatology (autoinmune Dz?) Depression 6-09: CP, stress test @ [facility] (-)  fibromyalgia - dx by dr Phylliss Bob  dx w/ ocular rosacea (Dx 03-2009)--- on doxycyclne   Past Surgical History: Reviewed history from 11/24/2008 and no changes required. Tonsillectomy   Social History: Reviewed history from 05/26/2009 and no changes required. Married no biological children Retired, Marketing executive-- a lot better , continue to loose wt  exercise -- some  tobacco-- quit 2005 aprox ETOH-- rarely   Physical Exam  General:  alert and well-developed.   Lungs:  normal respiratory effort, no intercostal retractions, no accessory muscle use, and normal breath sounds.   Heart:  normal rate, regular rhythm, and no murmur.   Psych:  Cognition and judgment appear intact. Alert and cooperative with normal attention span and concentration.  not anxious appearing and not depressed appearing.     Impression & Recommendations:  Problem # 1:  FIBROMYALGIA (ICD-729.1) well controlled  to meet Dr Dierdre Forth soon  no change  Continue with nortriptyline, things is giving her nightmares but as long as she takes Ambien she is okay    Aspir-low 81 Mg Tbec (Aspirin) Her updated medication list for this problem includes:    Vicodin 5-500 Mg Tabs (Hydrocodone-acetaminophen) .Marland Kitchen... Take 1 tablet by mouth every six hours  Problem # 2:  HYPOTHYROIDISM (ICD-244.9)  RF  Her updated medication  list for this problem includes:    Synthroid 88 Mcg Tabs (Levothyroxine sodium) .Marland Kitchen... 1 by mouth once daily  Labs Reviewed: TSH: 1.67 (05/26/2009)    Chol: 167 (11/16/2008)   HDL: 42.70 (11/16/2008)   LDL: 99 (11/16/2008)   TG: 128.0 (11/16/2008)  Orders: Venipuncture (84696) TLB-TSH (Thyroid Stimulating Hormone) (84443-TSH) Specimen Handling (29528)  Problem # 3:  pulse 104 initially recheck by me: 90  Complete Medication List: 1)  Nortriptyline Hcl 50 Mg Caps  (Nortriptyline hcl) .Marland Kitchen.. 1 at bedtime 2)  Ambien Cr 6.25 Mg Cr-tabs (Zolpidem tartrate) .Marland Kitchen.. 1 by mouth at bedtime 3)  Sumatriptan Succinate 100 Mg Tabs (Sumatriptan succinate) .Marland Kitchen.. 1 by mouth with onset of ha, repeat in 2 hours if needed. no more than 2 tabs in a 24h period 4)  Synthroid 88 Mcg Tabs (Levothyroxine sodium) .Marland Kitchen.. 1 by mouth once daily 5)  Vicodin 5-500 Mg Tabs (Hydrocodone-acetaminophen) .... Take 1 tablet by mouth every six hours 6)  Xanax 0.25 Mg Tabs (Alprazolam) .... Take 1 tablet by mouth twice a day as needed 7)  Mvi  8)  Vit C, Cal - D  9)  Premarin 0.625 Mg/gm Crea (Estrogens, conjugated) .... Qwk 10)  Red Yeast Rice  11)  Cranberry Pill  12)  Doxycycline Hyclate 100 Mg Caps (Doxycycline hyclate) .Marland Kitchen.. 1 by mouth once daily  Patient Instructions: 1)  please schedule your physical for 05-2010 Prescriptions: SYNTHROID 88 MCG TABS (LEVOTHYROXINE SODIUM) 1 by mouth once daily  #90 x 3   Entered and Authorized by:   Nolon Rod. Daniel Ritthaler MD   Signed by:   Nolon Rod. Abrham Maslowski MD on 10/26/2009   Method used:   Electronically to        [facility] [phone]* (retail)       [address]       [place], [place]  [address]       Ph: [phone]       Fax: [phone]   RxID:   [id number]

## 2010-05-10 NOTE — Consult Note (Signed)
Summary: [facility]  [facility]   Imported By: Lanelle Bal 06/02/2009 08:36:41  _____________________________________________________________________  External Attachment:    Type:   Image     Comment:   External Document

## 2010-05-10 NOTE — Progress Notes (Signed)
Summary: refill  Phone Note Refill Request Message from:  Fax from Pharmacy on [address] fax [phone]  hydrocodon-acetaminophen 5-500,zolpidem tart er 6.25mg   Initial call taken by: Barb Merino,  April 14, 2009 10:57 AM  Follow-up for Phone Call        Vicodin was rx'd #100 with 2 refills on 01/09/09;  ambien rx'd #30 with 6 refills on 09/30/08  Follow-up by: Shary Decamp,  April 14, 2009 11:10 AM  Additional Follow-up for Phone Call Additional follow up Details #1::        vicodin ok #100, 2 RF Ambien ok #30 and 6 RF Additional Follow-up by: Jesselle Laflamme E. Ranny Wiebelhaus MD,  April 14, 2009 12:53 PM    Additional Follow-up for Phone Call Additional follow up Details #2::    prescriptions were faxed to pharmacy NOT given to pt Follow-up by: Shary Decamp,  April 14, 2009 3:04 PM  Prescriptions: AMBIEN CR 6.25 MG CR-TABS (ZOLPIDEM TARTRATE) 1 by mouth at bedtime  #30 x 6   Entered by:   Shary Decamp   Authorized by:   Nolon Rod. Addilynne Olheiser MD   Signed by:   Shary Decamp on 04/14/2009   Method used:   Print then Give to Patient   RxID:   [id number] VICODIN 5-500 MG TABS (HYDROCODONE-ACETAMINOPHEN) Take 1 tablet by mouth every six hours  #100 x 2   Entered by:   Shary Decamp   Authorized by:   Nolon Rod. Talis Iwan MD   Signed by:   Shary Decamp on 04/14/2009   Method used:   Print then Give to Patient   RxID:   [id number]

## 2010-05-10 NOTE — Letter (Signed)
Summary: followup fibromyalgia, osteoporosis.-rheumatology  [facility]   Imported By: Lanelle Bal 01/10/2010 12:34:00  _____________________________________________________________________  External Attachment:    Type:   Image     Comment:   External Document

## 2010-05-10 NOTE — Progress Notes (Signed)
Summary: Prior Auth NOT required  Phone Note Refill Request Message from:  [facility] [address] Fax #: [phone]  Refills Requested: Medication #1:  SUMATRIPTAN SUCCINATE 100 MG TABS 1 by mouth with onset of HA   Dosage confirmed as above?Dosage Confirmed Prior Auth [id number]  Next Appointment Scheduled: none Initial call taken by: Harold Barban,  June 14, 2009 1:31 PM  Follow-up for Phone Call        Per our phone conversation with [facility] 05/07/09 & 05/20/09 -- Prior Auth is NOT required.  Patient can get #9 in a 30 day period WITHOUT prior auth!!!!!!  pharmacy advised Shary Decamp  June 14, 2009 2:41 PM

## 2010-05-10 NOTE — Miscellaneous (Signed)
Summary: labs from [facility]   Clinical Lists Changes  Observations: Added new observation of ESR: 12 mm/hr (10/12/2009 15:35) Added new observation of RA FACTOR: >20 intl units/mL (10/12/2009 15:35) Added new observation of SGPT (ALT): 13 units/L (10/12/2009 15:35) Added new observation of SGOT (AST): 17 units/L (10/12/2009 15:35) Added new observation of GLUCOSE SER: 129 mg/dL (14/78/2956 21:30) Added new observation of CREATININE: 0.72 mg/dL (86/57/8469 62:95) Added new observation of POTASSIUM: 4.2 mmol/L (10/12/2009 15:35) Added new observation of PLATELETS: 61 10*3/mm3 (10/12/2009 15:35) Added new observation of HGB: 4.53 g/dL (28/41/3244 01:02) Added new observation of WBC: 8.0 10*3/mm3 (10/12/2009 15:35)

## 2010-05-11 ENCOUNTER — Ambulatory Visit: Payer: Medicare Other | Attending: Orthopaedic Surgery | Admitting: Physical Therapy

## 2010-05-11 DIAGNOSIS — M542 Cervicalgia: Secondary | ICD-10-CM | POA: Insufficient documentation

## 2010-05-11 DIAGNOSIS — IMO0001 Reserved for inherently not codable concepts without codable children: Secondary | ICD-10-CM | POA: Insufficient documentation

## 2010-05-12 NOTE — Progress Notes (Signed)
Summary: Refill Request  Phone Note Refill Request Call back at [phone] Message from:  Pharmacy on April 06, 2010 10:54 AM  Refills Requested: Medication #1:  VICODIN 5-500 MG TABS Take 1 tablet by mouth every six hours   Dosage confirmed as above?Dosage Confirmed   Supply Requested: 100   Last Refilled: 01/11/2010   Notes: [facility] [address]  Next Appointment Scheduled: none Initial call taken by: Harold Barban,  April 06, 2010 10:54 AM  Follow-up for Phone Call        100, 2 RF Follow-up by: Nolon Rod. Paz MD,  April 06, 2010 11:17 AM    Prescriptions: VICODIN 5-500 MG TABS (HYDROCODONE-ACETAMINOPHEN) Take 1 tablet by mouth every six hours  #100 x 2   Entered by:   Army Fossa CMA   Authorized by:   Nolon Rod. Paz MD   Signed by:   Army Fossa CMA on 04/06/2010   Method used:   Printed then faxed to ...       [facility] [phone]* (retail)       [address]       [place], [place]  [address]       Ph: [phone]       Fax: [phone]   RxID:   [id number]

## 2010-05-12 NOTE — Letter (Signed)
Summary: New Patient letter  [facility]  [address]   [place], [place] [address]   Phone: [phone]  Fax: [phone]       04/25/2010 MRN: [id number]  [facility] [address] [place], [place]  [address]  Dear Ms. Chapple,  Welcome to the Gastroenterology Division at [facility].    You are scheduled to see Dr.  Christella Hartigan on 06/07/2010 at 2:00 on the 3rd floor at [facility], [address]  We ask that you try to arrive at our office 15 minutes prior to your appointment time to allow for check-in.  We would like you to complete the enclosed self-administered evaluation form prior to your visit and bring it with you on the day of your appointment.  We will review it with you.  Also, please bring a complete list of all your medications or, if you prefer, bring the medication bottles and we will list them.  Please bring your insurance card so that we may make a copy of it.  If your insurance requires a referral to see a specialist, please bring your referral form from your primary care physician.  Co-payments are due at the time of your visit and may be paid by cash, check or credit card.     Your office visit will consist of a consult with your physician (includes a physical exam), any laboratory testing he/she may order, scheduling of any necessary diagnostic testing (e.g. x-ray, ultrasound, CT-scan), and scheduling of a procedure (e.g. Endoscopy, Colonoscopy) if required.  Please allow enough time on your schedule to allow for any/all of these possibilities.    If you cannot keep your appointment, please call [phone] to cancel or reschedule prior to your appointment date.  This allows [place] the opportunity to schedule an appointment for another patient in need of care.  If you do not cancel or reschedule by 5 p.m. the business day prior to your appointment date, you will be charged a $50.00 late cancellation/no-show fee.    Thank you for choosing  [facility] for your medical needs.  We appreciate the opportunity to care for you.  Please visit [place] at our website  to learn more about our practice.                     Sincerely,                                                             The Gastroenterology Division

## 2010-05-12 NOTE — Progress Notes (Signed)
Summary: Refill Request  Phone Note Refill Request Call back at [phone] Message from:  Pharmacy on April 25, 2010 8:05 AM  Refills Requested: Medication #1:  AMBIEN CR 6.25 MG CR-TABS 1 by mouth at bedtime   Dosage confirmed as above?Dosage Confirmed   Brand Name Necessary? No   Supply Requested: 30   Last Refilled: 03/09/2010   Notes: [facility] [address]  Next Appointment Scheduled: 2.22.12 Initial call taken by: Harold Barban,  April 25, 2010 8:05 AM  Follow-up for Phone Call        30, 6 RF Follow-up by: Nolon Rod. Makell Cyr MD,  April 25, 2010 9:09 AM    Prescriptions: AMBIEN CR 6.25 MG CR-TABS (ZOLPIDEM TARTRATE) 1 by mouth at bedtime  #30 x 6   Entered by:   Army Fossa CMA   Authorized by:   Nolon Rod. Zamorah Ailes MD   Signed by:   Army Fossa CMA on 04/25/2010   Method used:   Printed then faxed to ...       [facility] [phone]* (retail)       [address]       [place], [place]  [address]       Ph: [phone]       Fax: [phone]   RxID:   [id number]

## 2010-05-16 ENCOUNTER — Encounter: Payer: Medicare Other | Admitting: Physical Therapy

## 2010-05-17 ENCOUNTER — Ambulatory Visit: Payer: Medicare Other | Attending: Internal Medicine | Admitting: Physical Therapy

## 2010-05-17 DIAGNOSIS — M542 Cervicalgia: Secondary | ICD-10-CM | POA: Insufficient documentation

## 2010-05-17 DIAGNOSIS — M2569 Stiffness of other specified joint, not elsewhere classified: Secondary | ICD-10-CM | POA: Insufficient documentation

## 2010-05-17 DIAGNOSIS — IMO0001 Reserved for inherently not codable concepts without codable children: Secondary | ICD-10-CM | POA: Insufficient documentation

## 2010-05-20 ENCOUNTER — Ambulatory Visit: Payer: Medicare Other | Admitting: Physical Therapy

## 2010-05-23 ENCOUNTER — Ambulatory Visit: Payer: Medicare Other | Admitting: Physical Therapy

## 2010-05-24 ENCOUNTER — Ambulatory Visit: Payer: Medicare Other | Admitting: Physical Therapy

## 2010-06-01 ENCOUNTER — Encounter: Payer: Self-pay | Admitting: Internal Medicine

## 2010-06-01 ENCOUNTER — Other Ambulatory Visit: Payer: Self-pay | Admitting: Internal Medicine

## 2010-06-01 ENCOUNTER — Encounter (INDEPENDENT_AMBULATORY_CARE_PROVIDER_SITE_OTHER): Payer: Medicare Other | Admitting: Internal Medicine

## 2010-06-01 DIAGNOSIS — IMO0001 Reserved for inherently not codable concepts without codable children: Secondary | ICD-10-CM

## 2010-06-01 DIAGNOSIS — E039 Hypothyroidism, unspecified: Secondary | ICD-10-CM

## 2010-06-01 DIAGNOSIS — R413 Other amnesia: Secondary | ICD-10-CM

## 2010-06-01 DIAGNOSIS — F329 Major depressive disorder, single episode, unspecified: Secondary | ICD-10-CM

## 2010-06-01 DIAGNOSIS — Z13220 Encounter for screening for lipoid disorders: Secondary | ICD-10-CM

## 2010-06-01 DIAGNOSIS — R55 Syncope and collapse: Secondary | ICD-10-CM

## 2010-06-01 LAB — CBC WITH DIFFERENTIAL/PLATELET
Basophils Absolute: 0 10*3/uL (ref 0.0–0.1)
Eosinophils Relative: 3 % (ref 0.0–5.0)
Lymphs Abs: 1.6 10*3/uL (ref 0.7–4.0)
MCHC: 33.8 g/dL (ref 30.0–36.0)
MCV: 93.6 fl (ref 78.0–100.0)
Monocytes Relative: 8.4 % (ref 3.0–12.0)
Neutro Abs: 3.7 10*3/uL (ref 1.4–7.7)
Neutrophils Relative %: 61.6 % (ref 43.0–77.0)
Platelets: 254 10*3/uL (ref 150.0–400.0)
RBC: 4.3 Mil/uL (ref 3.87–5.11)

## 2010-06-01 LAB — ALT: ALT: 14 U/L (ref 0–35)

## 2010-06-01 LAB — B12 AND FOLATE PANEL: Vitamin B-12: 585 pg/mL (ref 211–911)

## 2010-06-01 LAB — BASIC METABOLIC PANEL
BUN: 9 mg/dL (ref 6–23)
CO2: 28 mEq/L (ref 19–32)
Chloride: 107 mEq/L (ref 96–112)
Creatinine, Ser: 0.6 mg/dL (ref 0.4–1.2)

## 2010-06-01 LAB — AST: AST: 18 U/L (ref 0–37)

## 2010-06-01 LAB — LIPID PANEL: Total CHOL/HDL Ratio: 3

## 2010-06-01 LAB — CONVERTED CEMR LAB: Vit D, 25-Hydroxy: 42 ng/mL (ref 30–89)

## 2010-06-07 ENCOUNTER — Encounter: Payer: Self-pay | Admitting: Gastroenterology

## 2010-06-07 ENCOUNTER — Ambulatory Visit (INDEPENDENT_AMBULATORY_CARE_PROVIDER_SITE_OTHER): Payer: Medicare Other | Admitting: Gastroenterology

## 2010-06-07 DIAGNOSIS — K5900 Constipation, unspecified: Secondary | ICD-10-CM

## 2010-06-07 NOTE — Assessment & Plan Note (Signed)
Summary: multiple issues    Vital Signs:  Patient profile:   66 year old female Height:      67 inches Weight:      155 pounds BMI:     24.36 Pulse rate:   86 / minute Pulse rhythm:   regular BP sitting:   126 / 78  (left arm) Cuff size:   regular  Vitals Entered By: Army Fossa CMA (June 01, 2010 9:02 AM) CC: CPX, fasting  Comments "has a list for the doctor, will talk to him about it"  [facility] [address]   History of Present Illness: multiple issues, will do CPX at a later time   03-01-10 she had a syncope spell in the setting of severe constipation and severe abdominal cramps-sweating LOC lasted 30 seconds, she did injure her neck  never had a syncope before saw ortho, Neck MRI neg, did PT, doing well now  see ROS  needs a new Rx for imitrex  memory loss? lately having diff. finding words, needs to pause at mid-sentence . No serious events  like getting lost in her normal drving routes   she is self decreasing  nortryptiline  over months from 75 to 25qd, initially Rx for FM, she questions the Dx and actually so fa is doing qwell  concerned about her pulse  which is in the 90s, occ 104 BPs normal  ROS no SZ activity, tongue bite, b/b incontinence w/ the syncope  no CP-SOB occasionally ankle edema B no N-V--blood in stools  no depression, anxiety  Preventive Screening-Counseling & Management  Caffeine-Diet-Exercise     Does Patient Exercise: yes     Type of exercise: walking      Times/week: 2  Current Medications (verified): 1)  Nortriptyline Hcl 50 Mg Caps (Nortriptyline Hcl) .Marland Kitchen.. 1 At Bedtime 2)  Ambien Cr 6.25 Mg Cr-Tabs (Zolpidem Tartrate) .Marland Kitchen.. 1 By Mouth At Bedtime 3)  Sumatriptan Succinate 100 Mg Tabs (Sumatriptan Succinate) .Marland Kitchen.. 1 By Mouth With Onset of Ha, Repeat in 2 Hours If Needed. No More Than 2 Tabs in A 24h Period 4)  Synthroid 88 Mcg Tabs (Levothyroxine Sodium) .Marland Kitchen.. 1 By Mouth Once Daily 5)  Vicodin 5-500 Mg Tabs  (Hydrocodone-Acetaminophen) .... Take 1 Tablet By Mouth Every Six Hours 6)  Mvi 7)  Vit C, Cal - D 8)  Premarin 0.625 Mg/gm  Crea (Estrogens, Conjugated) .... Qwk 9)  Red Yeast Rice 10)  Cranberry Pill 11)  Doxycycline Hyclate 100 Mg Caps (Doxycycline Hyclate) .Marland Kitchen.. 1 By Mouth Once Daily  Allergies (verified): 1)  ! * Savella 2)  * Penicillins Group 3)  * Contrast Dye 4)  Fosamax (Alendronate Sodium)  Past History:  Past Medical History: Hypothyroidism migraine HA Osteopenia Colonic polyps, C-scope 11-2005: Bx adenomatous polyps, next scope 2010 Chronic pleuritic CP, etiology unclear; has been eval by pulmonary and Rheumatology (autoinmune Dz?) Depression 6-09: CP, stress test @ [facility] (-)  fibromyalgia - dx by dr Phylliss Bob  dx w/ ocular rosacea (Dx 03-2009)--- on doxycyclne   Past Surgical History: Reviewed history from 11/24/2008 and no changes required. Tonsillectomy   Social History: Does Patient Exercise:  yes  Physical Exam  General:  alert, well-developed, and well-nourished.   Neck:  no masses, no thyromegaly, and normal carotid upstroke.   Lungs:  normal respiratory effort, no intercostal retractions, no accessory muscle use, and normal breath sounds.   Heart:  normal rate, regular rhythm, and no murmur.   Extremities:  no edema Neurologic:  alert & oriented X3.   draw a clock face w/o problems Recalled 3 words w/o problems  Psych:  memory intact for recent and remote, normally interactive, good eye contact, not anxious appearing, and not depressed appearing.     Impression & Recommendations:  Problem # 1:  SYNCOPE (ICD-780.2) syncope in the setting of abdominal cramps, likely vaso-vagal EKG today NSR, no old EKG no further symptoms plan: observation    Orders: Venipuncture (01093) TLB-BMP (Basic Metabolic Panel-BMET) (80048-METABOL) TLB-CBC Platelet - w/Differential (85025-CBCD) EKG w/ Interpretation (93000) Specimen Handling (23557) EKG  w/ Interpretation (93000)  Problem # 2:  ? of MEMORY LOSS (ICD-780.93) see HPI, normal exam today rec observation , at some point will need a MMSE labs   Orders: TLB-B12 + Folate Pnl (82746_82607-B12/FOL) T-Vitamin D (25-Hydroxy) (32202-54270) Specimen Handling (62376)  Problem # 3:  FIBROMYALGIA (ICD-729.1) trying to decrease nortryptiline  which was Rx initially for FM, she question  the dx, doesn't know if  nortryptiline is  helping I agree to wean off, see instructions  Her updated medication list for this problem includes:    Vicodin 5-500 Mg Tabs (Hydrocodone-acetaminophen) .Marland Kitchen... Take 1 tablet by mouth every six hours  Orders: TLB-ALT (SGPT) (84460-ALT) TLB-AST (SGOT) (84450-SGOT) Specimen Handling (28315)  Problem # 4:  DEPRESSION (ICD-311) weaning of nortryp. see #3 The following medications were removed from the medication list:    Xanax 0.25 Mg Tabs (Alprazolam) .Marland Kitchen... Take 1 tablet by mouth twice a day as needed Her updated medication list for this problem includes:    Nortriptyline Hcl 10 Mg Caps (Nortriptyline hcl) .Marland Kitchen... 1 by mouth once daily  Problem # 5:  HEALTH SCREENING (ICD-V70.0) will do a CPX on RTC labs Rx for zostavas provided   Problem # 6:  slt elevated heart rate  labs   Problem # 7:  HYPOTHYROIDISM (ICD-244.9) labs  Her updated medication list for this problem includes:    Synthroid 88 Mcg Tabs (Levothyroxine sodium) .Marland Kitchen... 1 by mouth once daily  Labs Reviewed: TSH: 1.71 (10/26/2009)    Chol: 167 (11/16/2008)   HDL: 42.70 (11/16/2008)   LDL: 99 (11/16/2008)   TG: 128.0 (11/16/2008)  Problem # 8:  COMMON MIGRAINE (ICD-346.10) RF  Her updated medication list for this problem includes:    Sumatriptan Succinate 100 Mg Tabs (Sumatriptan succinate) .Marland Kitchen... 1 by mouth with onset of ha, repeat in 2 hours if needed. no more than 2 tabs in a 24h period    Vicodin 5-500 Mg Tabs (Hydrocodone-acetaminophen) .Marland Kitchen... Take 1 tablet by mouth every six  hours  Complete Medication List: 1)  Nortriptyline Hcl 10 Mg Caps (Nortriptyline hcl) .Marland Kitchen.. 1 by mouth once daily 2)  Ambien Cr 6.25 Mg Cr-tabs (Zolpidem tartrate) .Marland Kitchen.. 1 by mouth at bedtime 3)  Sumatriptan Succinate 100 Mg Tabs (Sumatriptan succinate) .Marland Kitchen.. 1 by mouth with onset of ha, repeat in 2 hours if needed. no more than 2 tabs in a 24h period 4)  Synthroid 88 Mcg Tabs (Levothyroxine sodium) .Marland Kitchen.. 1 by mouth once daily 5)  Vicodin 5-500 Mg Tabs (Hydrocodone-acetaminophen) .... Take 1 tablet by mouth every six hours 6)  Mvi  7)  Vit C, Cal - D  8)  Premarin 0.625 Mg/gm Crea (Estrogens, conjugated) .... Qwk 9)  Red Yeast Rice  10)  Cranberry Pill  11)  Doxycycline Hyclate 100 Mg Caps (Doxycycline hyclate) .Marland Kitchen.. 1 by mouth once daily 12)  Zostavax 17616 Unt/0.85ml Solr (Zoster vaccine live) .Marland Kitchen.. 1 s.q.  Other Orders: TLB-Lipid  Panel (80061-LIPID)  Patient Instructions: 1)  nortryptiline 25mg  once daily x 1 month, then 10mg  a day x 1 month, then stop 2)  OV next week for physical and discuss labs  Prescriptions: SUMATRIPTAN SUCCINATE 100 MG TABS (SUMATRIPTAN SUCCINATE) 1 by mouth with onset of HA, repeat in 2 hours if needed. No more than 2 tabs in a 24h period  #9 x 12   Entered and Authorized by:   [employer] E. Paz MD   Signed by:   Nolon Rod. Paz MD on 06/01/2010   Method used:   Print then Give to Patient   RxID:   [id number] ZOSTAVAX 46962 UNT/0.65ML SOLR (ZOSTER VACCINE LIVE) 1 s.q.  #1 x 0   Entered and Authorized by:   Nolon Rod. Paz MD   Signed by:   Nolon Rod. Paz MD on 06/01/2010   Method used:   Print then Give to Patient   RxID:   [id number] NORTRIPTYLINE HCL 10 MG CAPS (NORTRIPTYLINE HCL) 1 by mouth once daily  #30 x 1   Entered and Authorized by:   Elita Quick E. Paz MD   Signed by:   Nolon Rod. Paz MD on 06/01/2010   Method used:   Print then Give to Patient   RxID:   [id number]    Orders Added: 1)  Venipuncture [38756] 2)  TLB-BMP (Basic Metabolic Panel-BMET)  [80048-METABOL] 3)  TLB-ALT (SGPT) [84460-ALT] 4)  TLB-AST (SGOT) [84450-SGOT] 5)  TLB-CBC Platelet - w/Differential [85025-CBCD] 6)  TLB-B12 + Folate Pnl [82746_82607-B12/FOL] 7)  TLB-Lipid Panel [80061-LIPID] 8)  T-Vitamin D (25-Hydroxy) [43329-51884] 9)  EKG w/ Interpretation [93000] 10)  Specimen Handling [99000] 11)  EKG w/ Interpretation [93000] 12)  Est. Patient Level IV [16606]     Risk Factors:  Exercise:  yes    Times per week:  2    Type:  walking   Mammogram History:     Date of Last Mammogram:  11/08/2009    Results:  normal-per pt   PAP Smear History:     Date of Last PAP Smear:  11/08/2009    Results:  normal-per pt      Preventive Care Screening  Mammogram:    Date:  11/08/2009    Results:  normal-per pt   Pap Smear:    Date:  11/08/2009    Results:  normal-per pt

## 2010-06-08 ENCOUNTER — Other Ambulatory Visit: Payer: Self-pay | Admitting: Internal Medicine

## 2010-06-08 ENCOUNTER — Encounter: Payer: Self-pay | Admitting: Internal Medicine

## 2010-06-08 ENCOUNTER — Ambulatory Visit (INDEPENDENT_AMBULATORY_CARE_PROVIDER_SITE_OTHER): Payer: Medicare Other | Admitting: Internal Medicine

## 2010-06-08 DIAGNOSIS — E039 Hypothyroidism, unspecified: Secondary | ICD-10-CM

## 2010-06-08 DIAGNOSIS — IMO0001 Reserved for inherently not codable concepts without codable children: Secondary | ICD-10-CM

## 2010-06-08 DIAGNOSIS — M810 Age-related osteoporosis without current pathological fracture: Secondary | ICD-10-CM

## 2010-06-08 DIAGNOSIS — R55 Syncope and collapse: Secondary | ICD-10-CM

## 2010-06-08 DIAGNOSIS — D179 Benign lipomatous neoplasm, unspecified: Secondary | ICD-10-CM

## 2010-06-08 DIAGNOSIS — Z0000 Encounter for general adult medical examination without abnormal findings: Secondary | ICD-10-CM

## 2010-06-08 LAB — CONVERTED CEMR LAB
Bilirubin Urine: NEGATIVE
Blood in Urine, dipstick: NEGATIVE
Glucose, Urine, Semiquant: NEGATIVE
Ketones, urine, test strip: NEGATIVE
Nitrite: NEGATIVE
Protein, U semiquant: NEGATIVE
Specific Gravity, Urine: 1.015
Urobilinogen, UA: 0.2
WBC Urine, dipstick: NEGATIVE
pH: 6

## 2010-06-08 LAB — TSH: TSH: 0.54 u[IU]/mL (ref 0.35–5.50)

## 2010-06-16 NOTE — Assessment & Plan Note (Signed)
Review of gastrointestinal problems: 1. Adenomatous polyps, last colonoscopy 11/2008 (5 adenomas), recall at 3 years.  Prior colonoscopy with Dr. Virginia Rochester 2007, he recommended 3 year recall.   History of Present Illness Visit Type: Initial Consult Primary GI MD: Rob Bunting MD Primary Provider: Willow Ora MD Requesting Provider: Willow Ora, MD Chief Complaint: RUQ abd pressure during BM's. Pt has severe constiaption.  History of Present Illness:      is a 65 year old woman whom I last saw the time of the colonoscopy. See those results summarized above.  who has had trouble her whole life with constipation.  She will have attacks of severe pain usually starting in RUQ.  she will have a severe attack like this 3-4 times a years, the pain will work downward, then it is GONE when she has a BM.  she will usually have 1-2 BMs in a week. Started "stool softners" this past december, dulcolax one pill twice a week.  She is happy with the effect (an easier to move BM every other day). Still sometimes has to strain, and will have pain in RUQ.           Current Medications (verified): 1)  Nortriptyline Hcl 10 Mg Caps (Nortriptyline Hcl) .Marland Kitchen.. 1 By Mouth Once Daily 2)  Ambien Cr 6.25 Mg Cr-Tabs (Zolpidem Tartrate) .Marland Kitchen.. 1 By Mouth At Bedtime 3)  Sumatriptan Succinate 100 Mg Tabs (Sumatriptan Succinate) .Marland Kitchen.. 1 By Mouth With Onset of Ha, Repeat in 2 Hours If Needed. No More Than 2 Tabs in A 24h Period 4)  Synthroid 88 Mcg Tabs (Levothyroxine Sodium) .Marland Kitchen.. 1 By Mouth Once Daily 5)  Vicodin 5-500 Mg Tabs (Hydrocodone-Acetaminophen) .... Take 1 Tablet By Mouth Every Six Hours As Needed 6)  Mvi 7)  Vit C, Cal - D 8)  Premarin 0.625 Mg/gm  Crea (Estrogens, Conjugated) .... Qwk 9)  Red Yeast Rice 10)  Cranberry Pill 11)  Doxycycline Hyclate 100 Mg Caps (Doxycycline Hyclate) .Marland Kitchen.. 1 By Mouth Once Daily 12)  Stool Softener 100 Mg Caps (Docusate Sodium) .... One Tablet By Mouth Two Times A Day 13)  Fiber 625  Mg Tabs (Calcium Polycarbophil) .... One Tablet By Mouth Once Daily  Allergies (verified): 1)  ! * Savella 2)  * Penicillins Group 3)  * Contrast Dye 4)  Fosamax (Alendronate Sodium)  Vital Signs:  Patient profile:   65 year old female Height:      67 inches Weight:      155.13 pounds BMI:     24.38 Pulse rate:   90 / minute Pulse rhythm:   regular BP sitting:   126 / 72  (right arm) Cuff size:   regular  Vitals Entered By: Christie Nottingham CMA Duncan Dull) (June 07, 2010 1:51 PM)  Physical Exam  Additional Exam:  Constitutional: generally well appearing Psychiatric: alert and oriented times 3 Abdomen: soft, non-tender, non-distended, normal bowel sounds    Impression & Recommendations:  Problem # 1:   chronic constipation  she has had troubles with constipation her whole life. she will have intermittent severe abdominal pain that is all most always immediately relieved when she moves her bowels. she takes fiber supplements every day as well as Colace. in having her stop the Colace and I will put her on MiraLax powder one scoop daily. She will increase that as needed. She will call me in 4-5 weeks to report on her symptoms. I don't think we need to repeat any type of invasive testing at this  point.  Patient Instructions: 1)  Stay on the fiber supplement daily. 2)  Start one scoop of miralax powder every day.   Can increase to twice a day if needed. 3)  Call Dr. Christella Hartigan' in 4-5 weeks to report on your symptoms. 4)  Stop the docusate for now. 5)  Limit the vicodin as best that you can, it can contribute to constipation. 6)  The medication list was reviewed and reconciled.  All changed / newly prescribed medications were explained.  A complete medication list was provided to the patient / caregiver.

## 2010-06-16 NOTE — Assessment & Plan Note (Signed)
Summary: CPX and f/u/kn   Vital Signs:  Patient profile:   65 year old female Weight:      155 pounds Pulse rate:   76 / minute Pulse rhythm:   regular BP sitting:   128 / 80  (left arm) Cuff size:   regular  Vitals Entered By: Army Fossa CMA (June 08, 2010 10:17 AM) CC: 1 week f/u- doing weel Comments [facility] [address]   History of Present Illness: Here for Medicare AWV:  1.   Risk factors based on Past M, S, F history: reviewed  2.   Physical Activities: walks routinely around the [place], home chores, some yard work 3.   Depression/mood: no problems noted or reported  4.   Hearing: decreased hearing , saw ENT , needs hearing aids, waiting to get them due to cost.                  No problems noted during normal conservation 5.   ADL's: totally independent  6.   Fall Risk: low risk, normal balance  7.   Home Safety: does feel safe at hom  8.   Height, weight, &visual acuity: see VS, just saw eye docotr , has mild glaucoma  9.   Counseling:yes  10.   Labs ordered based on risk factors:  if needed  11.           Referral Coordination, if needed  12.           Care Plan, see a/p 13.            Cognitive Assessment: motor skill, coordination and cognition appropiate    In addition we discussed the following  long history of a lipoma in the left upper abdomen, concerned about it. recent syncope--  no further episodes  Heart rate was elevated-- HR normal today ,pt brought a  log , her  HR  ~ 80-90, rarely 100. BP wnl   fibromyalgia, she is tapering down nortriptyline, no problems so far   she saw  GI due to constipation, note reviewed , she was started on MiraLax Osteoporosis-- note reviewed , see below     Current Medications (verified): 1)  Nortriptyline Hcl 10 Mg Caps (Nortriptyline Hcl) .Marland Kitchen.. 1 By Mouth Once Daily 2)  Ambien Cr 6.25 Mg Cr-Tabs (Zolpidem Tartrate) .Marland Kitchen.. 1 By Mouth At Bedtime 3)  Sumatriptan Succinate 100 Mg Tabs (Sumatriptan Succinate) .Marland Kitchen.. 1 By  Mouth With Onset of Ha, Repeat in 2 Hours If Needed. No More Than 2 Tabs in A 24h Period 4)  Synthroid 88 Mcg Tabs (Levothyroxine Sodium) .Marland Kitchen.. 1 By Mouth Once Daily 5)  Vicodin 5-500 Mg Tabs (Hydrocodone-Acetaminophen) .... Take 1 Tablet By Mouth Every Six Hours As Needed 6)  Mvi 7)  Vit C, Cal - D 8)  Premarin 0.625 Mg/gm  Crea (Estrogens, Conjugated) .... Qwk 9)  Red Yeast Rice 10)  Cranberry Pill 11)  Doxycycline Hyclate 100 Mg Caps (Doxycycline Hyclate) .Marland Kitchen.. 1 By Mouth Once Daily 12)  Fiber 625 Mg Tabs (Calcium Polycarbophil) .... One Tablet By Mouth Once Daily 13)  Miralax  Powd (Polyethylene Glycol 3350)  Allergies (verified): 1)  ! * Savella 2)  * Penicillins Group 3)  * Contrast Dye 4)  Fosamax (Alendronate Sodium)  Past History:  Past Medical History: Hypothyroidism migraine HA Osteopenia Colonic polyps, C-scope 11-2005: Bx adenomatous polyps, next scope 2010 Chronic pleuritic CP, etiology unclear; has been eval by pulmonary and Rheumatology (autoinmune Dz?) Depression 6-09:  CP, stress test @ [facility] (-)  fibromyalgia - dx by dr Phylliss Bob  dx w/ ocular rosacea (Dx 03-2009)--- on doxycyclne   Past Surgical History: Reviewed history from 11/24/2008 and no changes required. Tonsillectomy   Review of Systems General:  Denies fever and weight loss. CV:  Denies chest pain or discomfort and swelling of feet. Resp:  Denies cough and shortness of breath. GI:  Denies bloody stools, nausea, and vomiting. GU:  sees DR Lomax.  Physical Exam  General:  alert, well-developed, and well-nourished.   Neck:  no masses, no thyromegaly, and normal carotid upstroke.   Lungs:  normal respiratory effort, no intercostal retractions, no accessory muscle use, and normal breath sounds.   Heart:  normal rate, regular rhythm, and no murmur.   Abdomen:  soft, non-tender, no distention, no masses, no guarding, and no rigidity.   she has a lipoma , soft, at the  left upper abdomen, is  about the size of a tennis [place]oc[place]ino Extremities:  no edema Psych:  memory intact for recent and remote, normally interactive, good eye contact, not anxious appearing, and not depressed appearing.     Impression & Recommendations:  Problem # 1:  HEALTH SCREENING (ICD-V70.0)  TD 2007 Pneumonia shot 2006 per patient  patient got a  shingles immunization prescription last week  Cscope 11-2005:  polyps-adenomatous, repeated  colonoscopy 11/2008, next 2013  sees Gyn-- lomax  recommend yearly mammogram  diet and exercise discussed  all recent labs discussed  Orders: Medicare -1st Annual Wellness Visit [phone])  Problem # 2:  OSTEOPOROSIS (ICD-733.00)  note from rheumatology September 2011 reviewed  she had a DEXA 9-11  elsewhere ,  T score -2.6 She took [place] for many years and it was discontinued. Rheumatology recommended calcium and vitamin D for now since she is overall low risk for fractures.  Problem # 3:  SYNCOPE (ICD-780.2)  No further events  Problem # 4:  HYPOTHYROIDISM (ICD-244.9)  Her updated medication list for this problem includes:    Synthroid 88 Mcg Tabs (Levothyroxine sodium) .Marland Kitchen... 1 by mouth once daily  Orders: TLB-TSH (Thyroid Stimulating Hormone) (84443-TSH) Venipuncture (60454) Specimen Handling (09811)  Problem # 5:  LIPOMA (ICD-214.9)   patient remains concerned about a lipoma in the abdomen. Refer to surgery for evaluation  Orders: Surgical Referral (Surgery)  Complete Medication List: 1)  Nortriptyline Hcl 10 Mg Caps (Nortriptyline hcl) .Marland Kitchen.. 1 by mouth once daily 2)  Ambien Cr 6.25 Mg Cr-tabs (Zolpidem tartrate) .Marland Kitchen.. 1 by mouth at bedtime 3)  Sumatriptan Succinate 100 Mg Tabs (Sumatriptan succinate) .Marland Kitchen.. 1 by mouth with onset of ha, repeat in 2 hours if needed. no more than 2 tabs in a 24h period 4)  Synthroid 88 Mcg Tabs (Levothyroxine sodium) .Marland Kitchen.. 1 by mouth once daily 5)  Vicodin 5-500 Mg Tabs (Hydrocodone-acetaminophen) .... Take 1 tablet by  mouth every six hours as needed 6)  Mvi  7)  Vit C, Cal - D  8)  Premarin 0.625 Mg/gm Crea (Estrogens, conjugated) .... Qwk 9)  Red Yeast Rice  10)  Cranberry Pill  11)  Doxycycline Hyclate 100 Mg Caps (Doxycycline hyclate) .Marland Kitchen.. 1 by mouth once daily 12)  Fiber 625 Mg Tabs (Calcium polycarbophil) .... One tablet by mouth once daily 13)  Miralax Powd (Polyethylene glycol 3350)  Other Orders: UA Dipstick w/o Micro (manual) (91478)  Patient Instructions: 1)  Please schedule a follow-up appointment in 6 months .    Orders Added: 1)  TLB-TSH (Thyroid  Stimulating Hormone) [84443-TSH] 2)  Venipuncture [84696] 3)  UA Dipstick w/o Micro (manual) [81002] 4)  Specimen Handling [99000] 5)  Medicare -1st Annual Wellness Visit [G0438] 6)  Est. Patient Level III [29528] 7)  Surgical Referral [Surgery]    Laboratory Results   Urine Tests    Routine Urinalysis   Color: yellow Appearance: Clear Glucose: negative   (Normal Range: Negative) Bilirubin: negative   (Normal Range: Negative) Ketone: negative   (Normal Range: Negative) Spec. Gravity: 1.015   (Normal Range: 1.003-1.035) Blood: negative   (Normal Range: Negative) pH: 6.0   (Normal Range: 5.0-8.0) Protein: negative   (Normal Range: Negative) Urobilinogen: 0.2   (Normal Range: 0-1) Nitrite: negative   (Normal Range: Negative) Leukocyte Esterace: negative   (Normal Range: Negative)    Comments: Army Fossa CMA  June 08, 2010 11:00 AM

## 2010-08-26 NOTE — H&P (Signed)
[name], [name]                ACCOUNT NO.:  [id number]   MEDICAL RECORD NO.:  [id number]          PATIENT TYPE:  INP   LOCATION:  NA                           FACILITY:  [facility]   PHYSICIAN:  Ines Bloomer, M.D. DATE OF BIRTH:  [birth year]   DATE OF ADMISSION:  06/27/2004  DATE OF DISCHARGE:                                HISTORY & PHYSICAL   HISTORY OF PRESENT ILLNESS:  This 65 year old patient has a long history of  right pleuritic chest pain and left pleural effusion as well as right  shoulder pain. She has been treated by Dr. Phylliss Bob and Dr. Marcelyn Bruins.  She  had a connective tissue workup which was negative; however, steroids have  caused the pleural effusion to go away.  The effusion is exudative with  negative cytology.  Gram stain showed 50% lymphs.  When she is on  prednisone, the effusion and the pain resolves but recurs on stopping the  prednisone.  The prednisone has recently been weaned.  As soon as she got  the 5 mg of prednisone today, she started having more chest pain.  Chest x-  ray done on February 17 showed no effusion.  After the prednisone had been  weaned off, she started having right and left chest pain.  A repeat CT scan  showed no pulmonary embolus, but there was nodularity in the left side near  the cardiophrenic angle but minimal pleural effusion either on the right or  the left side.  She is admitted for left VATS and pleural biopsy.   PAST MEDICAL HISTORY:   ALLERGIES:  1.  She is allergic to SYNTHROID.  2.  She is allergic to PENICILLIN which causes a rash.  3.  IVP DYE.   MEDICATIONS:  Includes Synthroid 100 mcg a day.   FAMILY HISTORY:  Negative for cancer and vascular disease.  Her mother was  adopted.   LABORATORY DATA AND OTHER STUDIES:  Pulmonary function tests showed an FVC  of 3.42 with an FEV1 of 2.52 and a 94% diffusion capacity and normal lung  volumes.   SOCIAL HISTORY:  She is married and has no children.  She is retired.   She  smoked for many years and quit smoking in November 2005.  Does not drink  alcohol on a regular basis.   REVIEW OF SYSTEMS:  She has had no change in her weight.  CARDIAC:  She has  had the right chest pain, but a cardiac workup was negative and no angina or  atrial fibrillation.  PULMONARY:  As in History of Present Illness.  She has  had no hemoptysis.  GI:  No nausea, vomiting, constipation, diarrhea.  No  history of reflux.  GU:  No frequent urination or dysuria. VASCULAR:  No  claudication, TIAs, or DVTs.  NEUROLOGIC:  No frequent headaches, blackouts,  dizziness, or seizures.  ORTHOPEDIC:  She has muscular pain really  throughout her body when she is off steroids.  No joint swelling.  HEENT:  No change in eyesight or hearing.  HEMATOLOGIC:  No anemia.   PHYSICAL  EXAMINATION:  GENERAL:  She is a well-developed, Caucasian female  in no acute distress.  VITAL SIGNS:  Blood pressure 124/78, pulse 92, respirations 18, O2  saturation 97%.  HEENT:  Head is atraumatic.  Eyes:  Pupils equal, round, and reactive to  light and accommodation.  Extraocular movements are normal.  Ears:  Tympanic  membranes are intact.  Nose:  No septal deviation.  Throat:  No lesions.  Uvula is midline.  Tongue is in the midline.  NECK:  No supraclavicular or axillary adenopathy.  No thyromegaly.  No  carotid bruits.  CHEST:  Clear to auscultation and percussion.  HEART:  Regular sinus rhythm, no murmurs.  ABDOMEN:  Soft. There is no hepatosplenomegaly.  Bowel sounds are normal.  EXTREMITIES:  Pulses 2+.  There is no clubbing or edema.  NEUROLOGIC:  Oriented x 3.  Cranial nerves are intact.  Sensory and motor  intact.  Deep tendon reflexes 1+.  SKIN:  Without lesions.   IMPRESSION:  1.  Recurrent left and right chest pain with left pleural effusion.  Rule      out cancer.  Rule out connective tissue disorder.  Rule out infection.  2.  Hypothyroidism.   PLAN:  1.  Left VATS.  2.  Drainage of pleural  effusion.  3.  Pleural biopsy.      DPB/MEDQ  D:  06/26/2004  T:  06/26/2004  Job:  [id number]

## 2010-08-26 NOTE — Op Note (Signed)
[name], [name]                ACCOUNT NO.:  [id number]   MEDICAL RECORD NO.:  [id number]          PATIENT TYPE:  INP   LOCATION:  2866                         FACILITY:  [facility]   PHYSICIAN:  Ines Bloomer, M.D. DATE OF BIRTH:  1945-04-16   DATE OF PROCEDURE:  06/27/2004  DATE OF DISCHARGE:                                 OPERATIVE REPORT   PREOPERATIVE DIAGNOSIS:  Recurrent pleuritis.   POSTOPERATIVE DIAGNOSIS:  Recurrent pleuritis.   OPERATION PERFORMED:  Left video assisted thoracoscopic surgery, pleural  biopsy.   SURGEON:  Ines Bloomer, M.D.   ASSISTANT:  Jerold Coombe, P.A.   ANESTHESIA:  General.   DESCRIPTION OF PROCEDURE:  After general anesthesia, a dual lumen tube was  inserted and the patient was turned to the left lateral thoracotomy  position.  The patient had recurrent pleuritic chest pain and effusions that  responded to steroids.  She was brought to the operating room for pleural  biopsy.  There was some nodularity seen posteriorly.  A 0 degree scope was  inserted and the anterior pleura and the lung appeared to be normal.  As  well, the pericardium did not appear to be grossly thickened.  So, turned  posteriorly and there was nodularity and pain posteriorly with increased  inflammatory tissue.  Multiple biopsies were taken of this area as well as  pictures.  Frozen section revealed a mesothelial infiltrate but no cancer.  A chest tube was inserted through the posterior trocar site and tied in  place with 0 silk.  A Marcaine block was done in the usual fashion.  The  other chest trocar site was closed with 2-0 Vicryl in multiple layers and 3-  0 Vicryl as a subcuticular stitch. The patient was returned to the recovery  room in stable condition.      DPB/MEDQ  D:  06/27/2004  T:  06/27/2004  Job:  [id number]   cc:   Marcelyn Bruins, M.D. [facility]

## 2010-08-26 NOTE — Op Note (Signed)
NAMEDARNEISHA, WINDHORST                ACCOUNT NO.:  [id number]   MEDICAL RECORD NO.:  [id number]          PATIENT TYPE:  AMB   LOCATION:  ENDO                         FACILITY:  [facility]   PHYSICIAN:  Georgiana Spinner, M.D.    DATE OF BIRTH:  07-Mar-1946   DATE OF PROCEDURE:  12/05/2005  DATE OF DISCHARGE:                                 OPERATIVE REPORT   PROCEDURE:  Colonoscopy.   INDICATIONS:  Colon polyps.   ANESTHESIA:  1. Demerol 25.  2. Versed 5 mg.   PROCEDURE:  With the patient mildly sedated in the left lateral decubitus  position, the Olympus videoscopic colonoscope was inserted into the rectum  and passed under direct vision to the cecum, identified by crow's foot of  the cecum and ileocecal valve, both which were photographed. From this  point, the colonoscope was slowly withdrawn, taking circumferential views of  the colonic mucosa, stopping only in the transverse colon where two polyps  were seen.  Both were photographed, and both were removed using snare  cautery technique at a setting of 20/200 blended current with __________  pulse generator. Both polyps were retrieved for pathology by suctioning  through the endoscope.  The endoscope was then withdrawn all the way to the  rectum, which appeared normal on direct and showed hemorrhoids on  retroflexed view. The endoscope was straightened and withdrawn.  The  patient's vital signs and pulse oximeter remained stable.  The patient  tolerated the procedure well, without apparent complications.   FINDINGS:  Polyps of transverse colon removed.  Await biopsy report.  The  patient will call me for results and follow up with me as an outpatient.  Internal hemorrhoids were noted           ______________________________  Georgiana Spinner, M.D.     GMO/MEDQ  D:  12/05/2005  T:  12/06/2005  Job:  [id number]   cc:   Willow Ora, MD

## 2010-08-26 NOTE — Discharge Summary (Signed)
NAMEMEGGEN, SPAZIANI                ACCOUNT NO.:  [id number]   MEDICAL RECORD NO.:  [id number]          PATIENT TYPE:  INP   LOCATION:  4729                         FACILITY:  [facility]   PHYSICIAN:  Georgina Quint. Plotnikov, M.D. Romualdo Bolk OF BIRTH:  December 29, 1945   DATE OF ADMISSION:  02/24/2004  DATE OF DISCHARGE:  02/28/2004                                 DISCHARGE SUMMARY   DISCHARGE MEDICATIONS:  1.  Prednisone 40 mg daily for 5 days then 30 mg daily for 5 days then 20 mg      daily for 5 days, then as per Dr. Teddy Spike instructions, take p.c.  2.  Over-the-counter Prilosec 20 mg daily.  3.  Avelox 400 mg for seven days, one daily.  4.  Synthroid 100 mcg daily.  5.  Albuterol MDI two puffs four times daily.  6.  Vicodin 5/500 one four times daily p.r.n.  7.  Resume other home medicines.   ACTIVITY:  As tolerated.   DIET:  Resume previous.   SPECIAL INSTRUCTIONS:  1.  Call if problems.  2.  Discontinue smoking.   FOLLOWUP PLAN:  1.  Dr. Shelle Iron in 1-2 weeks.  2.  Dr. Drue Novel in 4 weeks.   FINAL DIAGNOSES:  1.  Bilateral pleural effusions with trivial pericarditis likely due to      infection.  She is status post thoracentesis on the left.  CT scan      results pending.  2.  Hypotension, corrected.  3.  Hypothyroidism, undertreated.  Synthroid dose increased.  4.  Chest pain, pleuritic.   CONSULTATIONS:  Pulmonary, Dr. Shelle Iron.   PROCEDURES:  1.  Left thoracentesis.  2.  Cardiac echocardiogram.  3.  Chest CT scan.   HISTORY:  The patient is a 65 year old female who was admitted with a 10 day  history of chest pain and some shortness of breath.  For the details, please  address to Dr. Leta Jungling history and physical on February 24, 2004.  She  underwent a VQ scan, chest x-ray.  In the hospital she was started on  prednisone and was seen by Dr. Shelle Iron.  She underwent thoracentesis and a  CAT scan following thoracentesis, the results of which are pending.  Her  condition has greatly  improved.   On the day of discharge, her temperature is 97.3, heart rate 97,  respirations 20, blood pressure 101/57, sats 93% on room air.  She is in no  acute distress.  LUNGS:  With slight decreased breath sounds at  bases, no wheeze or rales.  HEART:  S1, S2, no murmur, no gallop.  CHEST WALL:  Nontender to palpation.  ABDOMEN:  Soft, nontender.  LOWER EXTREMITIES:  Without edema, calves nontender.  She is alert, oriented, cooperative.  SKIN:  Clear.   PPD result is still pending.   LAB WORK:  Cortisone level 15.8, pleural fluid cloudy with neutrophils 31,  monocytes 15, lymphocytes 50%, LDH of the fluid 115, sed rate 69.  Initial  white count 13.1, sodium 133, potassium 4.4, glucose 107.  CK normal with  TSH 30.19 on admission with  free T4 of 0.75, free T3 1.4, all decreased,  calcium 8.3.  Chest x-ray on February 25, 2004, with evacuation of left  pleural effusion with no pneumothorax, interval increase in the right  pleural effusion since February 25, 2004.  Transthoracic echo on February 25, 2004, showed abnormal septal __________ noted on limited views, which  would be consistent with rightsided volume/pressure overload; however, left  LV was normal, ventricular ejection fraction being 55-60%.  There was mild  mitral valvular regurgitation.  There was inferior vena cava mildly dilated.  There was a trivial pericardial effusion.  EKG was normal sinus rhythm.       AVP/MEDQ  D:  02/28/2004  T:  02/28/2004  Job:  [id number]   cc:   Marcelyn Bruins, M.D. [facility]   Willow Ora, M.D.  [phone] [place], [place] [address]

## 2010-08-26 NOTE — Discharge Summary (Signed)
NAMESHARMAIN, LASTRA                ACCOUNT NO.:  [id number]   MEDICAL RECORD NO.:  [id number]          PATIENT TYPE:  INP   LOCATION:  2030                         FACILITY:  [facility]   PHYSICIAN:  Ines Bloomer, M.D. DATE OF BIRTH:  [birth year]   DATE OF ADMISSION:  06/27/2004  DATE OF DISCHARGE:                                 DISCHARGE SUMMARY   PRIMARY DIAGNOSIS:  Recurrent left and right chest pain with left pleural  effusion.   SECONDARY DIAGNOSIS:  Hypothyroidism.   IN HOSPITAL OPERATION/PROCEDURE:  Left video assisted thoracoscopy with left  pleural biopsy.   HISTORY AND PHYSICAL AND HOSPITAL COURSE:  Ms Sargeant is a 65 year old patient  who has a long history of right pleuritic chest pain and left pleural  effusion as well as right shoulder pain.  She has been treated by Dr.  __________ and Dr. Shelle Iron.  She had connective tissue work-up which was  negative, however, steroids have caused the pleural effusion to go away.  The effusion is exudative with negative cytology.  Gram stains show 50%  lymphs.  When she is on prednisone, the effusion and the pain resolves but  reoccurs on stopping the prednisone.  The prednisone has recently been  weaned.  As soon as she got the 5 mg of prednisone on June 27, 2004, she  started having more chest pain.  Chest x-ray done on May 27, 2004,  showed no effusion.  After the prednisone had been weaned off, she started  having right and left chest pain.  A repeat CT scan showed no pulmonary  emboli but there was nodularity in the left side near the cardiophrenic  angle but minimal pleural effusion either on the right or the left side.  Patient was then admitted to [facility] for a left VATS  and pleural biopsy.  For details of the patient's past medical history and  physical examination, please see dictated history and physical.   HOSPITAL COURSE:  Ms. Kerman was admitted on June 27, 2004, to [facility].  She was taken to the operating room and underwent a left  video assisted thoracoscopy with a left pleural biopsy.  The patient  tolerated the procedure well and transferred up to the intensive care unit  in stable condition.  Her postoperative course is pretty much unremarkable.  She was able to be weaned from the oxygen on postoperative day #1 with O2  saturations 95 to 96%.  She did have left chest tube placed during surgery  which, postoperatively, had minimal drainage.  Chest tubes were discontinued  on June 29, 2004.  Chest x-ray showed no pneumothorax post removal of chest  tubes.  Chest x-ray prior to discharge also showed no recurrent left pleural  effusion.   Postoperatively day #1, patient was out of bed and ambulating.  Appetite was  improving.  All postoperative labs were within normal limits.   On postoperative day #2, patient's pathology report was obtained which  showed the pleural biopsy to be mesothelial hyperplasia and chronic  inflammation with no  evidence of malignancy.  Dr. Edwyna Shell discussed the  results with Ms. Limburg.   Noted during patient's postoperative course, Dr. Shelle Iron was consulted to  follow patient while in house.  Following pathology report, Dr. Shelle Iron  advised to place the patient on prednisone at discharge and he will follow  up with her in two weeks.  At that time, he may initiate Plaquenil.  Patient  was discharged to home on postoperative day #3 in stable condition.  She was  off O2 on room air with O2 saturations 96%.  Incisions are dry and intact  and healing well.  Again, chest x-ray was within normal limits with no  pneumo or effusion noted.  Ambulating well.  Appetite improving.   A follow-up appointment was made with Dr. Edwyna Shell for July 06, 2004, at  11:50 a.m.  Patient is to obtain a PA and lateral chest x-ray one hour prior  to this appointment at [facility].  Patient has an  appointment with Dr. Shelle Iron for July 13, 2004, at 12 p.m.   Ms. Madrid received instructions on diet, activity level and incisional care.  She was told no driving until released to do so and no heavy lifting.  She  was told to wash her incisions with soap and water and to contact the office  if she develops any drainage or opening from her incisions.  Patient  acknowledged her understanding.  She was also told to ambulate as much as  tolerated, three to four times per day and to continue her breathing  exercises.  She was also told diet should be heart-healthy.  Patient again  acknowledged her understanding.   DISCHARGE MEDICATIONS:  1.  Synthroid 75 mcg p.o. daily.  2.  Prednisone 10 mg p.o. daily.  3.  Tylox one tablet p.o. q.4h. p.r.n. pain.      KMD/MEDQ  D:  06/29/2004  T:  06/29/2004  Job:  [id number]

## 2010-08-26 NOTE — Op Note (Signed)
[name], [name]                ACCOUNT NO.:  [id number]   MEDICAL RECORD NO.:  [id number]          PATIENT TYPE:  AMB   LOCATION:  ENDO                         FACILITY:  [facility]   PHYSICIAN:  Georgiana Spinner, M.D.    DATE OF BIRTH:  [birth year]   DATE OF PROCEDURE:  12/05/2005  DATE OF DISCHARGE:                                 OPERATIVE REPORT   PROCEDURE:  Upper endoscopy.   INDICATIONS:  GERD.   ANESTHESIA:  Demerol 25 mg, Versed 2.5 mg.   PROCEDURE:  With the patient mildly sedated in the left lateral decubitus  position, the Olympus videoscopic endoscope was inserted in the mouth and  passed under direct vision through the esophagus, which appeared normal  until we reached distal esophagus, and there was a questionable area of  Barrett's photographed and biopsied.  We entered into the stomach.  Fundus,  body, antrum, duodenal bulb, second portion of duodenum were visualized.  From this point the endoscope was slowly withdrawn taking circumferential  views of the duodenal mucosa until the endoscope had been pulled back into  the stomach, placed in retroflexion to view the stomach from below.  The  endoscope was straightened and withdrawn taking circumferential views of the  remaining gastric and esophageal mucosa, stopping to do the biopsies as  mentioned above of the distal esophagus.  The patient's vital signs and  pulse oximeter remained stable.  The patient tolerated the procedure well  without apparent complications.   FINDINGS:  Question of Barrett's esophagus biopsied.   Await biopsy report.  The patient will call me for results and follow up  with me as an outpatient.  Proceed to colonoscopy as planned           ______________________________  Georgiana Spinner, M.D.     GMO/MEDQ  D:  12/05/2005  T:  12/06/2005  Job:  [id number]

## 2010-08-26 NOTE — H&P (Signed)
[name], [name]                ACCOUNT NO.:  [id number]   MEDICAL RECORD NO.:  [id number]          PATIENT TYPE:  INP   LOCATION:  4729                         FACILITY:  [facility]   PHYSICIAN:  Wanda Plump, MD [facility]    DATE OF BIRTH:  [birth year]   DATE OF ADMISSION:  02/24/2004  DATE OF DISCHARGE:                                HISTORY & PHYSICAL   CHIEF COMPLAINT:  Chest pain.   HISTORY OF PRESENT ILLNESS:  [name] is a 65 year old white female who  came to the office with a ten-day history of chest pain.  The pain is  described as stabbing, constant and gave from the suprasternal area straight  to the back.  It is worse with deep breath and is somehow worse when she  bends forward.  She went to the emergency room at [facility], and over  there, the workup included a normal CBC, BNP, and LFTs.  She also had a  negative d-dimer and a low probability VQ scan.  A chest x-ray was negative.  Whether or not she had an EKG, I do not know.  She was diagnosed with  pleuritic chest pain.  She was prescribed Toradol which she took faithfully.   While she was taking the Toradol she felt slightly better, but once it ran  out the pain is just as bad or worse than before.   PAST MEDICAL HISTORY:  1.  Hypothyroidism.  2.  Osteopenia.   FAMILY HISTORY:  Unknown because the patient is adopted.   REVIEW OF SYMPTOMS:  She denies any fever, cough, wheezing, hemoptysis.  She  does feel short of breath but she mostly relates that with being unable to  take deep breaths.  No nausea, vomiting, or diarrhea.  She just started  having some constipation in the last few days.  No GERD-type of symptoms.  No rash.   SOCIAL HISTORY:  The patient smokes a half pack a day.  She just moved from  [place] three weeks ago with her husband and she has no biological kids.   MEDICATIONS:  1.  Synthroid 75 mcg.  2.  Fosamax weekly.  The last time she took Fosamax was four days before the      pain started.  The  day she took the Fosamax she was basically      asymptomatic.   ALLERGIES:  PENICILLIN, IV CONTRAST.   PHYSICAL EXAMINATION:  GENERAL:  The patient is alert and oriented.  In mild  distress due to pain.  VITAL SIGNS:  She is afebrile.  Her pulse is 88, respirations 22, blood  pressure 132/70.  She is 5 feet 6-1/2 inches tall and weighed 154 pounds.  O2 saturation on room air is 98%.  HEENT:  Unremarkable.  LUNGS:  Clear to auscultation bilaterally.  CARDIOVASCULAR:  Regular rate and rhythm.  The S2 seems to be more  noticeable than the S1.  There is no rub even when I bend the patient  forward.  The chest wall is not tender to palpation.  ABDOMEN:  Not distended.  Soft.  Good bowel sounds.  She has mild tenderness  at the epigastric area without any rebound or mass.  EXTREMITIES:  No peripheral edema.  The calves are symmetric.  SKIN:  I see no rash in the chest or abdomen.  NEUROLOGICAL:  Speech, gait, and motor are intact.   LABORATORY DATA:  I reviewed the blood work and tests done at [facility].  The white count was 9.9 with a hemoglobin of 17.8, platelets of  229,000.  D-dimer was 0.22.  Potassium was 3.5, glucose was 96, creatinine  was 0.8.  LFTs were normal.  CK one set was negative.   VQ scan of the lung showed low probability for pulmonary emboli.  Chest x-  ray is described as normal.  Again, I do not see an EKG in the documents I  have; however, they describe in the note that she is in normal sinus with  nonspecific ST and T changes.   ASSESSMENT/PLAN:  1.  The patient has been admitted to my service with an atypical chest pain      and an electrocardiogram today showed diffuse T-wave inversions.  At      this point, my differential diagnosis is pericarditis versus coronary      artery disease which is less likely given the atypical pain.  Other      entities that could account for the symptoms are aortic disease,      gastroesophageal reflux disease with  esophagitis, etc.  I discussed this      with cardiology and we agreed that it is in the best interest of the      patient to be admitted.  We will rule her out with cardiac enzymes and      electrocardiograms.  I am also going to get a CT of the chest to rule      out aortic disease and get an echocardiogram.  I am going to put her on      aspirin as pericarditis is in my differential diagnosis.  2.  Epigastralgia:  This may be due to the recent use of Toradol and it may      be even related with a primary symptoms such as chest pain.  She will be      on Protonix and also I am going to give her Carafate as she is going to      take aspirin.  3.  Hypothyroidism:  We will continue with Synthroid.  4.  Osteopenia:  The patient does take Fosamax weekly; however, the symptoms      started a few days after the last dose of Fosamax consequently.  I do      not think they are related.       JEP/MEDQ  D:  02/24/2004  T:  02/24/2004  Job:  [id number]

## 2010-08-29 ENCOUNTER — Other Ambulatory Visit: Payer: Self-pay | Admitting: *Deleted

## 2010-08-29 MED ORDER — HYDROCODONE-ACETAMINOPHEN 5-500 MG PO CAPS: 1.0000 | ORAL_CAPSULE | Freq: Four times a day (QID) | ORAL | Status: AC | PRN

## 2010-08-29 NOTE — Telephone Encounter (Signed)
100, 2 RF

## 2010-09-20 ENCOUNTER — Encounter: Payer: Self-pay | Admitting: Internal Medicine

## 2010-09-20 ENCOUNTER — Ambulatory Visit (INDEPENDENT_AMBULATORY_CARE_PROVIDER_SITE_OTHER): Payer: Medicare Other | Admitting: Internal Medicine

## 2010-09-20 DIAGNOSIS — G43009 Migraine without aura, not intractable, without status migrainosus: Secondary | ICD-10-CM

## 2010-09-20 NOTE — Progress Notes (Signed)
  Subjective:    Patient ID: [name], female    DOB: 10/20/1945, 65 y.o.   MRN: [id number]  HPI Patient reports that her long-term insurance was denied based on documentation from her visits here on February 22 and 29 2012  Past Medical History  Diagnosis Date  . Hypothyroidism   . Migraine   . Osteopenia   . Colonic polyp     bx adenomatous polyps, next scope 2010  . Pleuritic chest pain     etiology unclear, has beeb eval by pulmonary and rheumatology (autoimmune dz?)  . Depression   . Chest pain     stress test @ [facility] cardiology (-)  . Fibromyalgia     dx by Dr.Rowe  . Ocular rosacea 03/2009    on doxy   Past Surgical History  Procedure Date  . Tonsillectomy      Review of Systems Doing well She continued taking nortriptyline 25 mg daily, she reports that medication is preventing her from having migraines.    Objective:   Physical Exam Alert, oriented x3, no apparent distress       Assessment & Plan:  We carefully reviewed the notes from February 22 and 29 with the patient. In the 06-01-10 visit she reported concerns about difficulty finding words and she wondered  if she was having trouble with her memory. On exam she was alert and oriented x3, no problems with memory found. During the visit on 06-08-10, her mental exam was also normal. During those visits, we also assessed her after a syncopal episode,  the workup was negative , my conclusion was that  She had a vasovagal event . At the patient request, I will write the letter explaining the above.

## 2010-09-20 NOTE — Assessment & Plan Note (Signed)
See review of systems, her nortriptyline was initially prescribed for FM but she realized that stopping nortriptyline triggered her headaches. Consequently she will stay on nortriptyline for now

## 2010-10-17 ENCOUNTER — Other Ambulatory Visit: Payer: Self-pay | Admitting: Internal Medicine

## 2010-10-25 ENCOUNTER — Other Ambulatory Visit: Payer: Self-pay | Admitting: Internal Medicine

## 2010-10-26 MED ORDER — ZOLPIDEM TARTRATE ER 6.25 MG PO TBCR: 6.2500 mg | EXTENDED_RELEASE_TABLET | Freq: Every evening | ORAL | Status: DC | PRN

## 2010-10-26 NOTE — Telephone Encounter (Signed)
30, 6 RF

## 2010-11-14 ENCOUNTER — Other Ambulatory Visit (HOSPITAL_BASED_OUTPATIENT_CLINIC_OR_DEPARTMENT_OTHER): Payer: Self-pay | Admitting: Gynecology

## 2010-11-14 DIAGNOSIS — Z1231 Encounter for screening mammogram for malignant neoplasm of breast: Secondary | ICD-10-CM

## 2010-11-29 ENCOUNTER — Encounter: Payer: Self-pay | Admitting: Internal Medicine

## 2010-11-29 ENCOUNTER — Telehealth: Payer: Self-pay | Admitting: *Deleted

## 2010-11-29 ENCOUNTER — Ambulatory Visit (INDEPENDENT_AMBULATORY_CARE_PROVIDER_SITE_OTHER): Payer: Medicare Other | Admitting: Internal Medicine

## 2010-11-29 VITALS — BP 108/78 | HR 85 | Temp 98.3°F | Resp 14 | Wt 151.5 lb

## 2010-11-29 DIAGNOSIS — G43009 Migraine without aura, not intractable, without status migrainosus: Secondary | ICD-10-CM

## 2010-11-29 DIAGNOSIS — IMO0001 Reserved for inherently not codable concepts without codable children: Secondary | ICD-10-CM

## 2010-11-29 DIAGNOSIS — E039 Hypothyroidism, unspecified: Secondary | ICD-10-CM

## 2010-11-29 LAB — TSH: TSH: 0.32 u[IU]/mL — ABNORMAL LOW (ref 0.35–5.50)

## 2010-11-29 MED ORDER — HYDROCODONE-ACETAMINOPHEN 5-500 MG PO TABS: 1.0000 | ORAL_TABLET | Freq: Four times a day (QID) | ORAL | Status: DC | PRN

## 2010-11-29 NOTE — Assessment & Plan Note (Signed)
History of fibromyalgia, symptoms well-controlled, taken  Vicodin as needed, not requiring much.

## 2010-11-29 NOTE — Assessment & Plan Note (Signed)
her nortriptyline was initially prescribed for FM but she realized that stopping nortriptyline triggered her headaches.  Currently  symptoms well controlled

## 2010-11-29 NOTE — Progress Notes (Signed)
  Subjective:    Patient ID: [name], female    DOB: 1945-07-30, 65 y.o.   MRN: [id number]  HPI Routine office visit, doing well, good compliance with thyroid medication  Past Medical History  Diagnosis Date  . Hypothyroidism   . Migraine   . Osteopenia   . Colonic polyp     bx adenomatous polyps, next scope 2010  . Pleuritic chest pain     etiology unclear, has beeb eval by pulmonary and rheumatology (autoimmune dz?)  . Depression   . Chest pain     stress test @ [facility] cardiology (-)  . Fibromyalgia     dx by Dr.Rowe  . Ocular rosacea 03/2009    on doxy   Past Surgical History  Procedure Date  . Tonsillectomy      Review of Systems Taking doxycycline daily for rosacea, good results. Taking amitriptyline daily, see last office visit, she noted that the headaches were not as well control when she decreased amitriptyline. Now headaches are  back to baseline (well controlled). Denies chest pain or shortness of breath No fever Occasional lower extremity edema particularly if she is inactive    Objective:   Physical Exam  Constitutional: She is oriented to person, place, and time. She appears well-developed and well-nourished.  Cardiovascular: Normal rate, regular rhythm and normal heart sounds.   No murmur heard. Pulmonary/Chest: Effort normal and breath sounds normal. No respiratory distress. She has no wheezes. She has no rales.  Musculoskeletal: She exhibits no edema.  Neurological: She is alert and oriented to person, place, and time.          Assessment & Plan:

## 2010-11-29 NOTE — Assessment & Plan Note (Signed)
Checking a TSH.   

## 2010-11-29 NOTE — Telephone Encounter (Signed)
Same Day Abstraction. 

## 2010-12-01 ENCOUNTER — Telehealth: Payer: Self-pay | Admitting: *Deleted

## 2010-12-01 DIAGNOSIS — E039 Hypothyroidism, unspecified: Secondary | ICD-10-CM

## 2010-12-01 NOTE — Telephone Encounter (Signed)
LMOM to inform Patient.  Lab order placed.

## 2010-12-01 NOTE — Telephone Encounter (Signed)
Message copied by Regis Bill on Thu Dec 01, 2010 12:26 PM ------      Message from: Willow Ora E      Created: Wed Nov 30, 2010  1:15 PM       May need less thyroid medicine. Please ask patient to continue with the same synthroid and come back for blood work (no need for OV) in 2 months for a TSH--dx hypothyroidism

## 2010-12-14 ENCOUNTER — Ambulatory Visit (HOSPITAL_BASED_OUTPATIENT_CLINIC_OR_DEPARTMENT_OTHER)
Admission: RE | Admit: 2010-12-14 | Discharge: 2010-12-14 | Disposition: A | Discharge status: Home / Self Care | Payer: Medicare Other | Source: Ambulatory Visit | Attending: Gynecology | Admitting: Gynecology

## 2010-12-14 DIAGNOSIS — Z1231 Encounter for screening mammogram for malignant neoplasm of breast: Secondary | ICD-10-CM | POA: Insufficient documentation

## 2011-01-05 ENCOUNTER — Other Ambulatory Visit: Payer: Self-pay | Admitting: Gynecology

## 2011-01-15 ENCOUNTER — Other Ambulatory Visit: Payer: Self-pay | Admitting: Internal Medicine

## 2011-01-16 NOTE — Telephone Encounter (Signed)
Done

## 2011-01-31 ENCOUNTER — Telehealth: Payer: Self-pay

## 2011-01-31 ENCOUNTER — Other Ambulatory Visit: Payer: Medicare Other

## 2011-01-31 MED ORDER — HYDROCODONE-ACETAMINOPHEN 5-500 MG PO TABS: 1.0000 | ORAL_TABLET | Freq: Four times a day (QID) | ORAL | Status: DC | PRN

## 2011-01-31 NOTE — Telephone Encounter (Signed)
Addended by: Helane Rima I on: 01/31/2011 04:47 PM   Modules accepted: Orders

## 2011-01-31 NOTE — Telephone Encounter (Signed)
Pt requesting refill for Vicodin.  Per MD, refill for same   Done

## 2011-01-31 NOTE — Telephone Encounter (Signed)
Doctor Drue Novel changed the qty of the Rx 01/31/2011

## 2011-02-01 ENCOUNTER — Telehealth: Payer: Self-pay | Admitting: Internal Medicine

## 2011-02-01 ENCOUNTER — Other Ambulatory Visit: Payer: Self-pay | Admitting: Internal Medicine

## 2011-02-01 DIAGNOSIS — E039 Hypothyroidism, unspecified: Secondary | ICD-10-CM

## 2011-02-01 NOTE — Telephone Encounter (Signed)
received phone call [facility] - [address] requesting refill hydocodone 5/500 - per phamacist last refill was [phone] for 50 pills

## 2011-02-01 NOTE — Telephone Encounter (Signed)
Rx phoned in.   

## 2011-02-02 ENCOUNTER — Encounter: Payer: Self-pay | Admitting: Family Medicine

## 2011-02-02 ENCOUNTER — Other Ambulatory Visit (INDEPENDENT_AMBULATORY_CARE_PROVIDER_SITE_OTHER): Payer: Medicare Other

## 2011-02-02 ENCOUNTER — Ambulatory Visit (INDEPENDENT_AMBULATORY_CARE_PROVIDER_SITE_OTHER): Payer: Medicare Other | Admitting: Family Medicine

## 2011-02-02 VITALS — BP 122/86 | Temp 98.9°F | Ht 66.0 in | Wt 154.0 lb

## 2011-02-02 DIAGNOSIS — J329 Chronic sinusitis, unspecified: Secondary | ICD-10-CM | POA: Insufficient documentation

## 2011-02-02 DIAGNOSIS — E039 Hypothyroidism, unspecified: Secondary | ICD-10-CM

## 2011-02-02 MED ORDER — CLARITHROMYCIN ER 500 MG PO TB24: 1000.0000 mg | ORAL_TABLET | Freq: Every day | ORAL | Status: AC

## 2011-02-02 NOTE — Progress Notes (Signed)
  Subjective:    Patient ID: [name], female    DOB: [birth year], 65 y.o.   MRN: [id number]  HPI Ear pain- L sided, started Monday.  sxs are worsening.  Used Sweet Oil w/out relief.  Pain is now radiating down neck, into throat, up near eye.  No fevers.  No drainage from ear.  No known sick contacts.  Minimal nasal congestion but + PND.  No cough.   Review of Systems For ROS see HPI     Objective:   Physical Exam  Vitals reviewed. Constitutional: She appears well-developed and well-nourished. No distress.  HENT:  Head: Normocephalic and atraumatic.  Right Ear: Tympanic membrane normal.  Left Ear: Tympanic membrane is retracted.  Nose: Mucosal edema and rhinorrhea present. Right sinus exhibits no maxillary sinus tenderness and no frontal sinus tenderness. Left sinus exhibits maxillary sinus tenderness. Left sinus exhibits no frontal sinus tenderness.  Mouth/Throat: Uvula is midline and mucous membranes are normal. Posterior oropharyngeal erythema present. No oropharyngeal exudate.  Eyes: Conjunctivae and EOM are normal. Pupils are equal, round, and reactive to light.  Neck: Normal range of motion. Neck supple.  Cardiovascular: Normal rate, regular rhythm and normal heart sounds.   Pulmonary/Chest: Effort normal and breath sounds normal. No respiratory distress. She has no wheezes.  Lymphadenopathy:    She has no cervical adenopathy.          Assessment & Plan:

## 2011-02-02 NOTE — Progress Notes (Signed)
Labs only

## 2011-02-02 NOTE — Assessment & Plan Note (Signed)
L maxillary sinusitis.  Start Biaxin.  Reviewed supportive care and red flags that should prompt return.  Pt expressed understanding and is in agreement w/ plan.

## 2011-02-02 NOTE — Patient Instructions (Signed)
This is a sinus infection Take the Biaxin as directed- take w/ food to avoid upset stomach Drink plenty of fluids Mucinex to thin your post nasal drip Call with any questions or concerns Hang in there!!!

## 2011-02-14 ENCOUNTER — Ambulatory Visit (INDEPENDENT_AMBULATORY_CARE_PROVIDER_SITE_OTHER): Payer: Medicare Other | Admitting: Internal Medicine

## 2011-02-14 ENCOUNTER — Encounter: Payer: Self-pay | Admitting: Internal Medicine

## 2011-02-14 VITALS — BP 132/76 | HR 82 | Temp 98.7°F | Resp 18

## 2011-02-14 DIAGNOSIS — J329 Chronic sinusitis, unspecified: Secondary | ICD-10-CM

## 2011-02-14 MED ORDER — MOXIFLOXACIN HCL 400 MG PO TABS: 400.0000 mg | ORAL_TABLET | Freq: Every day | ORAL | Status: AC

## 2011-02-14 MED ORDER — PREDNISONE 10 MG PO TABS: ORAL_TABLET | ORAL | Status: AC

## 2011-02-14 NOTE — Assessment & Plan Note (Signed)
Persistent symptoms of sinusitis despite antibiotics. Recommend a second round of antibiotic with Avelox, also prednisone to decrease inflammation. See  instructions

## 2011-02-14 NOTE — Patient Instructions (Signed)
Rest, fluids , tylenol, saline nasal spray For congestion take Mucinex DM twice a day as needed  Prednisone as prescribed  Take the antibiotic as prescribed ----> avelox Don't take nortriptyline while on avelox Call if no better in few days Call anytime if the symptoms are severe

## 2011-02-14 NOTE — Progress Notes (Signed)
  Subjective:    Patient ID: [name], female    DOB: [birth year], 65 y.o.   MRN: [id number]  HPI Was seen 02-02-11, dx w/  Sinusitis, finished Biaxin, not better, still very congested in the L ear    Past Medical History  Diagnosis Date  . Hypothyroidism   . Migraine   . Osteopenia   . Colonic polyp     bx adenomatous polyps, next scope 2010  . Pleuritic chest pain     etiology unclear, has beeb eval by pulmonary and rheumatology (autoimmune dz?)  . Depression   . Chest pain     stress test @ [facility] cardiology (-)  . Fibromyalgia     dx by Dr.Rowe  . Ocular rosacea 03/2009    on doxy     Review of Systems No F/C Mild chest congestion if any, mild cough ++ PN drip    Objective:   Physical Exam  Constitutional: She appears well-developed and well-nourished.  HENT:  Head: Normocephalic and atraumatic.       Face symmetric, tender and the left maxillary sinus. Tympanic membranes on the right side the bulge, retracted on the left. Percussion of the mastoid areas bilaterally normal. Palpation of the gum , percussion of the teeth without evidence of infection or abscess.   Cardiovascular: Normal rate, regular rhythm and normal heart sounds.   No murmur heard. Pulmonary/Chest: Effort normal and breath sounds normal. No respiratory distress. She has no wheezes. She has no rales.       Assessment & Plan:

## 2011-04-11 HISTORY — PX: OTHER SURGICAL HISTORY: SHX169

## 2011-04-13 ENCOUNTER — Other Ambulatory Visit: Payer: Self-pay | Admitting: Internal Medicine

## 2011-05-22 ENCOUNTER — Other Ambulatory Visit: Payer: Self-pay | Admitting: Internal Medicine

## 2011-05-22 MED ORDER — HYDROCODONE-ACETAMINOPHEN 5-500 MG PO TABS: 1.0000 | ORAL_TABLET | Freq: Four times a day (QID) | ORAL | Status: DC | PRN

## 2011-05-22 NOTE — Telephone Encounter (Signed)
Yes

## 2011-05-22 NOTE — Telephone Encounter (Signed)
Refill done.  

## 2011-05-22 NOTE — Telephone Encounter (Signed)
Refill request vicodin 5-500mg . #100 with 2 refills. OK to refill?

## 2011-06-12 ENCOUNTER — Ambulatory Visit (INDEPENDENT_AMBULATORY_CARE_PROVIDER_SITE_OTHER): Payer: Medicare Other | Admitting: Internal Medicine

## 2011-06-12 ENCOUNTER — Encounter: Payer: Self-pay | Admitting: Internal Medicine

## 2011-06-12 DIAGNOSIS — Z23 Encounter for immunization: Secondary | ICD-10-CM

## 2011-06-12 DIAGNOSIS — G43009 Migraine without aura, not intractable, without status migrainosus: Secondary | ICD-10-CM

## 2011-06-12 DIAGNOSIS — F329 Major depressive disorder, single episode, unspecified: Secondary | ICD-10-CM

## 2011-06-12 DIAGNOSIS — E039 Hypothyroidism, unspecified: Secondary | ICD-10-CM

## 2011-06-12 DIAGNOSIS — F3289 Other specified depressive episodes: Secondary | ICD-10-CM

## 2011-06-12 DIAGNOSIS — IMO0001 Reserved for inherently not codable concepts without codable children: Secondary | ICD-10-CM

## 2011-06-12 DIAGNOSIS — Z0000 Encounter for general adult medical examination without abnormal findings: Secondary | ICD-10-CM

## 2011-06-12 DIAGNOSIS — M810 Age-related osteoporosis without current pathological fracture: Secondary | ICD-10-CM

## 2011-06-12 LAB — CBC WITH DIFFERENTIAL/PLATELET
Basophils Absolute: 0 10*3/uL (ref 0.0–0.1)
Eosinophils Relative: 2.6 % (ref 0.0–5.0)
Lymphocytes Relative: 27.6 % (ref 12.0–46.0)
Monocytes Relative: 10.4 % (ref 3.0–12.0)
Neutrophils Relative %: 58.9 % (ref 43.0–77.0)
Platelets: 245 10*3/uL (ref 150.0–400.0)
RDW: 13.8 % (ref 11.5–14.6)
WBC: 5.9 10*3/uL (ref 4.5–10.5)

## 2011-06-12 LAB — BASIC METABOLIC PANEL
BUN: 14 mg/dL (ref 6–23)
CO2: 28 mEq/L (ref 19–32)
Chloride: 106 mEq/L (ref 96–112)
GFR: 78.71 mL/min (ref >60.00)
Glucose, Bld: 75 mg/dL (ref 70–99)
Potassium: 3.9 mEq/L (ref 3.5–5.1)

## 2011-06-12 LAB — LIPID PANEL
LDL Cholesterol: 97 mg/dL (ref 0–99)
Total CHOL/HDL Ratio: 3
VLDL: 21.4 mg/dL (ref 0.0–40.0)

## 2011-06-12 LAB — TSH: TSH: 1.31 u[IU]/mL (ref 0.35–5.50)

## 2011-06-12 NOTE — Assessment & Plan Note (Signed)
Used to be on nortriptyline for prevention, self discontinued it, doing well

## 2011-06-12 NOTE — Assessment & Plan Note (Signed)
Saw rheumatology recently, doing well

## 2011-06-12 NOTE — Assessment & Plan Note (Addendum)
TD 2007 Pneumonia shot 2006 and today Had a shingles immunization  Cscope 11-2005:  polyps-adenomatous, repeated  colonoscopy 11/2008, next 12-2011  sees Gyn-- lomax Reports she does  Mammogram regulalrly  diet and exercise discussed Labs

## 2011-06-12 NOTE — Assessment & Plan Note (Signed)
Good medication compliance, due for labs 

## 2011-06-12 NOTE — Progress Notes (Signed)
  Subjective:    Patient ID: [name], female    DOB: 13-Oct-1945, 66 y.o.   MRN: [id number]  HPI Here for Medicare AWV: 1. Risk factors based on Past M, S, F history: reviewed  2. Physical Activities: takes  Walks w/ the dog, home chores, some yard work 3. Depression/mood: no problems noted or reported,  4. Hearing: decreased hearing , saw ENT , Rx hearing aids, waiting to get them due to cost.    5. ADL's: totally independent  6. Fall Risk: low risk, normal balance  7. Home Safety: does feel safe at hom  8. Height, weight, &visual acuity: see VS,sees eye doctor x2/year, has mild glaucoma  9. Counseling:yes  10. Labs ordered based on risk factors:  if needed  11.           Referral Coordination, if needed  12.           Care Plan, see a/p 13.            Cognitive Assessment: motor skill, coordination and cognition appropiate    In addition we discussed the following Fibromyalgia, saw  Her rheumatologist recently, good reports. Migraines, she discontinue nortriptyline for headache prophylaxis, feels very well, still using Imitrex twice a month which works well for her Insomnia, discontinue Ambien, over-the-counter medications works just as well Hypothyroidism, good medication compliance Osteoporosis, managed by rheumatology, see assessment and plan. She recently saw her cardiologist just for a general checkup, no blood work but she was told she was doing okay.  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  Respiratory: Negative for cough and shortness of breath.   Cardiovascular: Negative for chest pain and leg swelling.  Gastrointestinal: Negative for abdominal pain and blood in stool.  Genitourinary: Negative for dysuria and hematuria.       Objective:   Physical Exam General:  alert, well-developed, and well-nourished.   Neck:  no masses, no thyromegaly, and normal carotid upstroke.   Lungs:  normal respiratory effort, no intercostal retractions, no accessory muscle use, and normal breath sounds.   Heart:  normal rate, regular rhythm, and no murmur.   Abdomen:  soft, non-tender, no distention, no masses, no guarding, and no rigidity.   she has a lipoma , soft, at the  left upper abdomen, is about the size of a tennis [place] Extremities:  no edema Psych:  memory intact for recent and remote, normally interactive, good eye contact, not anxious appearing, and not depressed appearing.       Assessment & Plan:

## 2011-06-12 NOTE — Assessment & Plan Note (Addendum)
F/u by  rheumatology, intolerant to Fosamax however took Boniva without problems for many years. DEXA 9-11  elsewhere ,  T score -2.6 Also a bone density test around 11-2010 per pt, T score was very close to -2.6. Rheumatology recommended to continue calcium and vitamin D  which she is doing

## 2011-06-12 NOTE — Assessment & Plan Note (Signed)
The patient used to be on nortriptyline mostly for headache prevention, she is off that medication and doing well

## 2011-06-14 ENCOUNTER — Encounter: Payer: Self-pay | Admitting: *Deleted

## 2011-07-10 ENCOUNTER — Other Ambulatory Visit: Payer: Self-pay | Admitting: Internal Medicine

## 2011-07-10 NOTE — Telephone Encounter (Signed)
Refill done.  

## 2011-09-05 ENCOUNTER — Other Ambulatory Visit: Payer: Self-pay | Admitting: Internal Medicine

## 2011-09-05 MED ORDER — HYDROCODONE-ACETAMINOPHEN 5-500 MG PO TABS: 1.0000 | ORAL_TABLET | Freq: Four times a day (QID) | ORAL | Status: DC | PRN

## 2011-09-05 NOTE — Telephone Encounter (Signed)
Refill done.  

## 2011-09-05 NOTE — Telephone Encounter (Signed)
refill hydrocodon-acetaminophen 5-500 Take one tablet by mouth every 6-hours as needed  Last written 2.11.13, qty 100 Last OV 3.4.13

## 2011-09-05 NOTE — Telephone Encounter (Signed)
Ok to refill 

## 2011-09-05 NOTE — Telephone Encounter (Signed)
100, 3 RF

## 2011-11-10 ENCOUNTER — Other Ambulatory Visit (HOSPITAL_BASED_OUTPATIENT_CLINIC_OR_DEPARTMENT_OTHER): Payer: Self-pay | Admitting: Gynecology

## 2011-11-10 DIAGNOSIS — Z1231 Encounter for screening mammogram for malignant neoplasm of breast: Secondary | ICD-10-CM

## 2011-12-14 ENCOUNTER — Ambulatory Visit: Payer: Medicare Other | Admitting: Internal Medicine

## 2011-12-20 ENCOUNTER — Ambulatory Visit (HOSPITAL_BASED_OUTPATIENT_CLINIC_OR_DEPARTMENT_OTHER)
Admission: RE | Admit: 2011-12-20 | Discharge: 2011-12-20 | Disposition: A | Discharge status: Home / Self Care | Payer: Medicare Other | Source: Ambulatory Visit | Attending: Gynecology | Admitting: Gynecology

## 2011-12-20 DIAGNOSIS — Z1231 Encounter for screening mammogram for malignant neoplasm of breast: Secondary | ICD-10-CM

## 2012-01-02 ENCOUNTER — Encounter: Payer: Self-pay | Admitting: Gastroenterology

## 2012-01-03 ENCOUNTER — Other Ambulatory Visit: Payer: Self-pay | Admitting: Internal Medicine

## 2012-01-04 NOTE — Telephone Encounter (Signed)
done

## 2012-01-04 NOTE — Telephone Encounter (Signed)
Ok to refill 

## 2012-01-08 ENCOUNTER — Other Ambulatory Visit: Payer: Self-pay | Admitting: Gynecology

## 2012-01-16 ENCOUNTER — Encounter: Payer: Self-pay | Admitting: Gastroenterology

## 2012-01-23 ENCOUNTER — Ambulatory Visit (INDEPENDENT_AMBULATORY_CARE_PROVIDER_SITE_OTHER): Payer: Medicare Other | Admitting: Internal Medicine

## 2012-01-23 ENCOUNTER — Encounter: Payer: Self-pay | Admitting: Internal Medicine

## 2012-01-23 VITALS — BP 120/78 | HR 66 | Temp 98.1°F | Wt 156.0 lb

## 2012-01-23 DIAGNOSIS — R079 Chest pain, unspecified: Secondary | ICD-10-CM

## 2012-01-23 DIAGNOSIS — G8929 Other chronic pain: Secondary | ICD-10-CM

## 2012-01-23 DIAGNOSIS — N390 Urinary tract infection, site not specified: Secondary | ICD-10-CM

## 2012-01-23 LAB — POCT URINALYSIS DIPSTICK
Bilirubin, UA: NEGATIVE
Nitrite, UA: NEGATIVE
Protein, UA: NEGATIVE
Urobilinogen, UA: 0.2
pH, UA: 7

## 2012-01-23 MED ORDER — SULFAMETHOXAZOLE-TRIMETHOPRIM 800-160 MG PO TABS: 1.0000 | ORAL_TABLET | Freq: Two times a day (BID) | ORAL | Status: DC

## 2012-01-23 MED ORDER — HYDROCODONE-ACETAMINOPHEN 5-325 MG PO TABS: 1.0000 | ORAL_TABLET | Freq: Four times a day (QID) | ORAL | Status: DC | PRN

## 2012-01-23 NOTE — Progress Notes (Signed)
  Subjective:    Patient ID: [name], female    DOB: [birth year], 66 y.o.   MRN: [id number]  HPI Acute visit Few days history of left-sided low back pain, occasionally radiates to the hip, at the same time her usual nocturia is worse. Denies fever, chills. No actually dysuria or gross hematuria. No rash in the back.  2 weeks ago noted a "bug bite" on the left leg, is not getting better, denies itching or discharge.  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   Heart-- normal rate, regular rhythm, no murmur, and no gallop.   Abdomen--soft, non-tender, no distention, no masses, no HSM, no guarding, and no rigidity.no CVA tenderness   Extremities-- no pretibial edema bilaterally ; in the inner aspect of the left calf has a three-quarter centimeter red spot, no fluctuance, discharge . There is some scabbing in the center. Psych-- Cognition and judgment appear intact. Alert and cooperative with normal attention span and concentration.  not anxious appearing and not depressed appearing.       Assessment & Plan:  1. Presents with mild back pain and increased urinary frequency, urine shows some blood. Likely has a UTI. See instructions 2. Skin lesion, bug bite?  Recommend hydrocortisone, she also will be on Bactrim 3. Takes Vicodin about 2 or 3 times a day, would like to switch from the 5/500 to the 5/325. New rx  issue.

## 2012-01-23 NOTE — Patient Instructions (Signed)
We are sending a urine culture, in the meantime start Bactrim one tablet twice a day for 5 days and drink plenty of fluids. Hydrocortisone cream over-the-counter to do a but bite. Call in not   Improving in few days

## 2012-01-23 NOTE — Assessment & Plan Note (Signed)
Chronic chest pain of Vicodin as needed, will change Vicodin from 5/500 to 5/325

## 2012-01-25 ENCOUNTER — Telehealth: Payer: Self-pay

## 2012-01-25 MED ORDER — DOXYCYCLINE HYCLATE 100 MG PO TABS: 100.0000 mg | ORAL_TABLET | Freq: Two times a day (BID) | ORAL | Status: DC

## 2012-01-25 NOTE — Telephone Encounter (Signed)
Discussed with pt. Sent in abx & added bactrim to pt's allergy list.

## 2012-01-25 NOTE — Telephone Encounter (Signed)
Pt states Batrium is causing her to have upset stomach pt asking for another Rx. Plz advise    MW

## 2012-01-25 NOTE — Telephone Encounter (Signed)
Discontinue Bactrim, urine culture was negative. She does have a skin lesion consequently call doxycycline 100 mg one by mouth twice a day #10 no refills

## 2012-02-14 ENCOUNTER — Other Ambulatory Visit: Payer: Medicare Other | Admitting: Gastroenterology

## 2012-03-25 ENCOUNTER — Other Ambulatory Visit: Payer: Medicare Other | Admitting: Gastroenterology

## 2012-04-11 ENCOUNTER — Ambulatory Visit (AMBULATORY_SURGERY_CENTER): Payer: Medicare Other

## 2012-04-11 VITALS — Ht 66.5 in | Wt 157.0 lb

## 2012-04-11 DIAGNOSIS — Z860100 Personal history of colon polyps, unspecified: Secondary | ICD-10-CM

## 2012-04-11 MED ORDER — MOVIPREP 100 G PO SOLR: 1.0000 | Freq: Once | ORAL | Status: DC

## 2012-04-26 ENCOUNTER — Encounter: Payer: Self-pay | Admitting: Gastroenterology

## 2012-04-26 ENCOUNTER — Ambulatory Visit (AMBULATORY_SURGERY_CENTER): Payer: Medicare Other | Admitting: Gastroenterology

## 2012-04-26 VITALS — BP 115/74 | HR 57 | Temp 97.7°F | Resp 28 | Ht 66.5 in | Wt 157.0 lb

## 2012-04-26 DIAGNOSIS — D126 Benign neoplasm of colon, unspecified: Secondary | ICD-10-CM

## 2012-04-26 DIAGNOSIS — Z86010 Personal history of colonic polyps: Secondary | ICD-10-CM

## 2012-04-26 MED ORDER — SODIUM CHLORIDE 0.9 % IV SOLN: 500.0000 mL | INTRAVENOUS | Status: DC

## 2012-04-26 NOTE — Progress Notes (Signed)
Patient did not experience any of the following events: a burn prior to discharge; a fall within the facility; wrong site/side/patient/procedure/implant event; or a hospital transfer or hospital admission upon discharge from the facility. (G8907) Patient did not have preoperative order for IV antibiotic SSI prophylaxis. (G8918)  

## 2012-04-26 NOTE — Patient Instructions (Signed)
YOU HAD AN ENDOSCOPIC PROCEDURE TODAY AT THE  ENDOSCOPY CENTER: Refer to the procedure report that was given to you for any specific questions about what was found during the examination.  If the procedure report does not answer your questions, please call your gastroenterologist to clarify.  If you requested that your care partner not be given the details of your procedure findings, then the procedure report has been included in a sealed envelope for you to review at your convenience later.  YOU SHOULD EXPECT: Some feelings of bloating in the abdomen. Passage of more gas than usual.  Walking can help get rid of the air that was put into your GI tract during the procedure and reduce the bloating. If you had a lower endoscopy (such as a colonoscopy or flexible sigmoidoscopy) you may notice spotting of blood in your stool or on the toilet paper. If you underwent a bowel prep for your procedure, then you may not have a normal bowel movement for a few days.  DIET: Your first meal following the procedure should be a light meal and then it is ok to progress to your normal diet.  A half-sandwich or bowl of soup is an example of a good first meal.  Heavy or fried foods are harder to digest and may make you feel nauseous or bloated.  Likewise meals heavy in dairy and vegetables can cause extra gas to form and this can also increase the bloating.  Drink plenty of fluids but you should avoid alcoholic beverages for 24 hours.  ACTIVITY: Your care partner should take you home directly after the procedure.  You should plan to take it easy, moving slowly for the rest of the day.  You can resume normal activity the day after the procedure however you should NOT DRIVE or use heavy machinery for 24 hours (because of the sedation medicines used during the test).    SYMPTOMS TO REPORT IMMEDIATELY: A gastroenterologist can be reached at any hour.  During normal business hours, 8:30 AM to 5:00 PM Monday through Friday,  call [phone].  After hours and on weekends, please call the GI answering service at [phone] who will take a message and have the physician on call contact you.   Following lower endoscopy (colonoscopy or flexible sigmoidoscopy):  Excessive amounts of blood in the stool  Significant tenderness or worsening of abdominal pains  Swelling of the abdomen that is new, acute  Fever of 100F or higher    FOLLOW UP: If any biopsies were taken you will be contacted by phone or by letter within the next 1-3 weeks.  Call your gastroenterologist if you have not heard about the biopsies in 3 weeks.  Our staff will call the home number listed on your records the next business day following your procedure to check on you and address any questions or concerns that you may have at that time regarding the information given to you following your procedure. This is a courtesy call and so if there is no answer at the home number and we have not heard from you through the emergency physician on call, we will assume that you have returned to your regular daily activities without incident.  SIGNATURES/CONFIDENTIALITY: You and/or your care partner have signed paperwork which will be entered into your electronic medical record.  These signatures attest to the fact that that the information above on your After Visit Summary has been reviewed and is understood.  Full responsibility of the confidentiality   of this discharge information lies with you and/or your care-partner.    Information on polyps given to you today 

## 2012-04-26 NOTE — Op Note (Signed)
[facility] [address] [place], [address]   COLONOSCOPY PROCEDURE REPORT  PATIENT: [name], [name]  MR#: [id number] BIRTHDATE: [birth year] , 66  yrs. old GENDER: Female ENDOSCOPIST: Rachael Fee, MD PROCEDURE DATE:  04/26/2012 PROCEDURE:   Colonoscopy with snare polypectomy ASA CLASS:   Class III INDICATIONS:adenomatous polyps, several removed 2010. MEDICATIONS: Fentanyl 50 mcg IV, Versed 5 mg IV, and These medications were titrated to patient response per physician's verbal order  DESCRIPTION OF PROCEDURE:   After the risks benefits and alternatives of the procedure were thoroughly explained, informed consent was obtained.  A digital rectal exam revealed no abnormalities of the rectum.   The LB [id number]  endoscope was introduced through the anus and advanced to the cecum, which was identified by both the appendix and ileocecal valve. No adverse events experienced.   The quality of the prep was good, using MoviPrep  The instrument was then slowly withdrawn as the colon was fully examined.  COLON FINDINGS: Three small sessile polyps were found, removed and all were sent to pathology.  These ranged in size from 2mm to 4mm, located in cecum, ascending and descending segments, all removed with cold snare.  The examination was otherwise normal. Retroflexed views revealed no abnormalities. The time to cecum=4 minutes 02 seconds.  Withdrawal time=9 minutes 02 seconds.  The scope was withdrawn and the procedure completed. COMPLICATIONS: There were no complications.  ENDOSCOPIC IMPRESSION: Three small sessile polyps were found, removed and all were sent to pathology. The examination was otherwise normal.  RECOMMENDATIONS: If the polyp(s) removed today are proven to be adenomatous (pre-cancerous) polyps, you will need a colonoscopy in 3-5 years. You will receive a letter within 1-2 weeks with the results of your biopsy as well as final  recommendations.  Please call my office if you have not received a letter after 3 weeks.   eSigned:  Rachael Fee, MD 04/26/2012 9:17 AM   cc:  Willow Ora, MD

## 2012-04-26 NOTE — Progress Notes (Signed)
The pt tolerated the colonoscopy very well. Maw   

## 2012-04-29 ENCOUNTER — Telehealth: Payer: Self-pay | Admitting: *Deleted

## 2012-04-29 NOTE — Telephone Encounter (Signed)
  Follow up Call-  Call back number 04/26/2012  Post procedure Call Back phone  # [phone]  Permission to leave phone message Yes     Patient questions:  Do you have a fever, pain , or abdominal swelling? no Pain Score  0 *  Have you tolerated food without any problems? yes  Have you been able to return to your normal activities? yes  Do you have any questions about your discharge instructions: Diet   no Medications  no Follow up visit  no  Do you have questions or concerns about your Care? no  Actions: * If pain score is 4 or above: No action needed, pain <4.

## 2012-05-06 ENCOUNTER — Encounter: Payer: Self-pay | Admitting: Gastroenterology

## 2012-05-08 ENCOUNTER — Encounter: Payer: Self-pay | Admitting: Internal Medicine

## 2012-05-08 ENCOUNTER — Ambulatory Visit (INDEPENDENT_AMBULATORY_CARE_PROVIDER_SITE_OTHER): Payer: Medicare Other | Admitting: Internal Medicine

## 2012-05-08 VITALS — BP 110/78 | HR 97 | Temp 100.0°F | Wt 160.0 lb

## 2012-05-08 DIAGNOSIS — B9789 Other viral agents as the cause of diseases classified elsewhere: Secondary | ICD-10-CM

## 2012-05-08 DIAGNOSIS — J329 Chronic sinusitis, unspecified: Secondary | ICD-10-CM

## 2012-05-08 DIAGNOSIS — B349 Viral infection, unspecified: Secondary | ICD-10-CM

## 2012-05-08 DIAGNOSIS — J029 Acute pharyngitis, unspecified: Secondary | ICD-10-CM

## 2012-05-08 MED ORDER — DOXYCYCLINE HYCLATE 100 MG PO TABS: 100.0000 mg | ORAL_TABLET | Freq: Two times a day (BID) | ORAL | Status: DC

## 2012-05-08 NOTE — Progress Notes (Signed)
  Subjective:    Patient ID: [name], female    DOB: 12-Nov-1945, 67 y.o.   MRN: [id number]  HPI Acute visit Symptoms started 5 days ago with sore throat, cough and fever up to 102.5. She was taking a number of OTCs without much relief She was doing better yesterday but today she has persistent cough and a temperature of 100.2. Does not recall any sick contacts. Had a flu shot. Still has a skin lesion, see assessment and plan   Past Medical History: Hypothyroidism migraine HA Osteopenia Colonic polyps, C-scope 11-2005: Bx adenomatous polyps, next scope 2010 Chronic pleuritic CP, etiology unclear; has been eval by pulmonary and Rheumatology (autoinmune Dz?) Depression 6-09: CP, stress test @ [facility] (-)   fibromyalgia - dx by dr Phylliss Bob   dx w/ ocular rosacea (Dx 03-2009)--- on doxycyclne   Mild glaucoma   Past Surgical History: Tonsillectomy  Face plastic surgery 2013 FH: Adopted  Review of Systems Denies any rash. She does have some headache at the frontal area and top of the head. Mild chest pain with cough. No sputum production. No nausea vomiting, did have some diarrhea. Very mild shortness of breath, she thinks related to cough. Myalgias only with fever. Mild nasal congestion with small amount of yellow discharge.      Objective:   Physical Exam General -- alert, well-developed, and vital signs stable, temperature 100.0. Does not look toxic.Marland Kitchen   Neck -- FROM HEENT -- Injected conjunctivas, nose is slightly congested, face symmetric, tender at the left maxillary sinus. Tympanic membranes normal. Lungs -- normal respiratory effort, no intercostal retractions, no accessory muscle use, and normal breath sounds.   Heart-- normal rate, regular rhythm, no murmur, and no gallop.   Abdomen--soft, non-tender, no distention, no masses, no HSM, no guarding, and no rigidity.   Extremities-- no pretibial edema bilaterally Skin-- inner aspect of the left calf  has a 3 mm, macular, hyperpigmented lesion, Center is a slightly erythematous. Psych-- Cognition and judgment appear intact. Alert and cooperative with normal attention span and concentration.  not anxious appearing and not depressed appearing.        Assessment & Plan:    Viral syndrome, Patient likely has a viral syndrome, influenza? Now complicated with what seems to be an acute  Sinusitis. Strep test was (-) See instructions  Skin lesion, seems postinflammatory hyperpigmentation, recommend more observation for now.

## 2012-05-08 NOTE — Patient Instructions (Addendum)
Rest, fluids , tylenol For cough, take Mucinex DM twice a day as needed  If the cough persist, use hydrocodone which is a cough suppressant Take the antibiotic as prescribed  (doxycycline) Call if no better in few days Call anytime if the symptoms are severe

## 2012-05-25 ENCOUNTER — Other Ambulatory Visit: Payer: Self-pay

## 2012-05-27 ENCOUNTER — Telehealth: Payer: Self-pay | Admitting: *Deleted

## 2012-05-27 MED ORDER — HYDROCODONE-ACETAMINOPHEN 5-325 MG PO TABS: 1.0000 | ORAL_TABLET | Freq: Four times a day (QID) | ORAL | Status: DC | PRN

## 2012-05-27 NOTE — Telephone Encounter (Signed)
Yes, #100, Rx printed. Ask to to come at her convenience   to sign an agreement and get the urine test.

## 2012-05-27 NOTE — Telephone Encounter (Signed)
Called patient's home number, spoke with husband made aware that script for Vicodin was available for pick up.

## 2012-05-27 NOTE — Telephone Encounter (Signed)
Patient called triage line wants refill on hydrocodone 5/325. Last OV was 1/29, acute has appt schedule on 08/02/12 for CPE. Last fill 01/23/12 #100 with no refills. Patient does not have medication agreement on file, okay to refill? Please advise.

## 2012-05-29 ENCOUNTER — Encounter: Payer: Self-pay | Admitting: Internal Medicine

## 2012-06-02 ENCOUNTER — Encounter: Payer: Self-pay | Admitting: Internal Medicine

## 2012-06-03 ENCOUNTER — Telehealth: Payer: Self-pay | Admitting: *Deleted

## 2012-06-03 NOTE — Telephone Encounter (Signed)
Entered flu shot per pt.

## 2012-06-30 ENCOUNTER — Other Ambulatory Visit: Payer: Self-pay | Admitting: Internal Medicine

## 2012-07-01 NOTE — Telephone Encounter (Signed)
Refill done . Pt due for labs.  

## 2012-07-02 ENCOUNTER — Telehealth: Payer: Self-pay | Admitting: Internal Medicine

## 2012-07-02 MED ORDER — HYDROCODONE-ACETAMINOPHEN 5-325 MG PO TABS: 1.0000 | ORAL_TABLET | Freq: Four times a day (QID) | ORAL | Status: DC | PRN

## 2012-07-02 NOTE — Telephone Encounter (Signed)
Ok to refill 

## 2012-07-02 NOTE — Telephone Encounter (Signed)
Refill done.  

## 2012-08-02 ENCOUNTER — Encounter: Payer: Self-pay | Admitting: Internal Medicine

## 2012-08-02 ENCOUNTER — Ambulatory Visit (INDEPENDENT_AMBULATORY_CARE_PROVIDER_SITE_OTHER): Payer: Medicare Other | Admitting: Internal Medicine

## 2012-08-02 VITALS — BP 122/76 | HR 73 | Temp 98.5°F | Ht 67.0 in | Wt 157.0 lb

## 2012-08-02 DIAGNOSIS — E039 Hypothyroidism, unspecified: Secondary | ICD-10-CM

## 2012-08-02 DIAGNOSIS — M810 Age-related osteoporosis without current pathological fracture: Secondary | ICD-10-CM

## 2012-08-02 DIAGNOSIS — G43009 Migraine without aura, not intractable, without status migrainosus: Secondary | ICD-10-CM

## 2012-08-02 DIAGNOSIS — Z0000 Encounter for general adult medical examination without abnormal findings: Secondary | ICD-10-CM

## 2012-08-02 DIAGNOSIS — G8929 Other chronic pain: Secondary | ICD-10-CM

## 2012-08-02 NOTE — Assessment & Plan Note (Signed)
Due for labs

## 2012-08-02 NOTE — Progress Notes (Signed)
  Subjective:    Patient ID: [name], female    DOB: 1946/01/04, 67 y.o.   MRN: [id number]  HPI Here for Medicare AWV: 1.         Risk factors based on Past M, S, F history: reviewed   2.         Physical Activities: takes walks w/ the dog, home chores, some yard work 3.         Depression/mood: (-) screening   4.         Hearing: decreased hearing , saw ENT , using hearing aids     5.         ADL's: totally independent   6.         Fall Risk: low risk, normal balance , see instructions   7.         Home Safety: does feel safe at home   8.         Height, weight, &visual acuity: see VS,sees eye doctor x 2 /year, has mild glaucoma   9.         Counseling:yes   10.       Labs ordered based on risk factors:  if needed   11.           Referral Coordination, if needed   12.           Care Plan, see a/p 13.            Cognitive Assessment: motor skill, coordination and cognition appropiate    In addition we discussed the following Fibromyalgia, symptoms well-controlled with pain medication as needed. Migraines, less frequent than before, self discontinue Imitrex due to GI side effects. Migraines are now mildler before. Hypothyroidism, good medication compliance. H/o  glaucoma, 2 surgeries in the last year, uses her drops consistently.  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 No cough. No nausea, vomiting, diarrhea or blood in the stools. No dysuria or gross  hematuria.    Objective:   Physical Exam  General -- alert, well-developed, No apparent distress, vital signs stable.  Neck --no thyromegaly Lungs -- normal respiratory effort, no intercostal retractions, no accessory muscle use, and normal breath sounds.   Heart-- normal rate, regular rhythm, no murmur, and no gallop.   Abdomen--Good bowel sounds, no bruit, palpable, nontender aorta in the upper abdomen. Other lines abdominal exam is normal Extremities-- no pretibial edema bilaterally  Neurologic-- alert & oriented X3 and strength normal in all extremities. Psych-- Cognition and judgment appear intact. Alert and cooperative with normal attention span and concentration.  not anxious appearing and not depressed appearing.       Assessment & Plan:

## 2012-08-02 NOTE — Assessment & Plan Note (Addendum)
TD 2007 Pneumonia shot 2006 and 2013 Had a shingles immunization  Cscope 11-2005:  polyps-adenomatous, repeated  colonoscopy 11/2008 and 04-2012, next in 5 years.  sees Gyn  last  Mammogram 2013 Palpable aorta, will get a screening ultrasound (at [facility]) diet and exercise discussed Labs From last year  within normal

## 2012-08-02 NOTE — Assessment & Plan Note (Signed)
Per rheumatology Dr. Dierdre Forth, Normal vitamin D and 2012

## 2012-08-02 NOTE — Assessment & Plan Note (Addendum)
Having less intense and frequent headaches, not using Imitrex due to GI side effects.

## 2012-08-02 NOTE — Patient Instructions (Signed)

## 2012-08-02 NOTE — Assessment & Plan Note (Signed)
Resolved

## 2012-08-06 ENCOUNTER — Ambulatory Visit (HOSPITAL_BASED_OUTPATIENT_CLINIC_OR_DEPARTMENT_OTHER): Payer: Medicare Other

## 2012-08-08 ENCOUNTER — Ambulatory Visit (HOSPITAL_BASED_OUTPATIENT_CLINIC_OR_DEPARTMENT_OTHER)
Admission: RE | Admit: 2012-08-08 | Discharge: 2012-08-08 | Disposition: A | Discharge status: Home / Self Care | Payer: Medicare Other | Source: Ambulatory Visit | Attending: Internal Medicine | Admitting: Internal Medicine

## 2012-08-08 DIAGNOSIS — R6889 Other general symptoms and signs: Secondary | ICD-10-CM | POA: Insufficient documentation

## 2012-08-08 DIAGNOSIS — Z0000 Encounter for general adult medical examination without abnormal findings: Secondary | ICD-10-CM

## 2012-08-26 ENCOUNTER — Encounter: Payer: Self-pay | Admitting: Internal Medicine

## 2012-08-30 ENCOUNTER — Other Ambulatory Visit: Payer: Self-pay | Admitting: Internal Medicine

## 2012-08-30 ENCOUNTER — Telehealth: Payer: Self-pay | Admitting: *Deleted

## 2012-08-30 MED ORDER — HYDROCODONE-ACETAMINOPHEN 5-325 MG PO TABS: 1.0000 | ORAL_TABLET | Freq: Four times a day (QID) | ORAL | Status: DC | PRN

## 2012-08-30 NOTE — Telephone Encounter (Signed)
Ok 100, 1 RF Let pt know we did send a Rx

## 2012-08-30 NOTE — Telephone Encounter (Signed)
Last Filled 07-02-12#100 , last OV 08-02-12. Pt left Vm stating that she is currently out of med and pharmacy has faxed this Rx twice with no response. No documentation in chart or fax to show any request for med by the pharmacy.Please advise\

## 2012-08-30 NOTE — Telephone Encounter (Signed)
rx was just sent to pharmacy.

## 2012-08-30 NOTE — Telephone Encounter (Signed)
rx printed. Pt made aware rx has been sent to pharmacy.

## 2012-09-29 ENCOUNTER — Other Ambulatory Visit: Payer: Self-pay | Admitting: Internal Medicine

## 2012-09-30 NOTE — Telephone Encounter (Signed)
Refill done.  

## 2012-10-10 ENCOUNTER — Encounter: Payer: Self-pay | Admitting: Internal Medicine

## 2012-10-27 ENCOUNTER — Telehealth: Payer: Self-pay | Admitting: Internal Medicine

## 2012-10-29 NOTE — Telephone Encounter (Signed)
done

## 2012-10-29 NOTE — Telephone Encounter (Signed)
rx faxed to pt. Pharmacy.  

## 2012-10-29 NOTE — Telephone Encounter (Signed)
Ok to refill? Last OV 4.25.14 Last filled 5.23.14 for #100 with 1 RF. Confirmed with [facility] pt. Has already picked up both rx.

## 2012-11-26 ENCOUNTER — Encounter: Payer: Self-pay | Admitting: Internal Medicine

## 2012-11-26 ENCOUNTER — Other Ambulatory Visit (HOSPITAL_BASED_OUTPATIENT_CLINIC_OR_DEPARTMENT_OTHER): Payer: Self-pay | Admitting: Gynecology

## 2012-11-26 DIAGNOSIS — Z1231 Encounter for screening mammogram for malignant neoplasm of breast: Secondary | ICD-10-CM

## 2012-11-26 NOTE — Telephone Encounter (Signed)
Please advise 

## 2012-11-27 ENCOUNTER — Telehealth: Payer: Self-pay | Admitting: *Deleted

## 2012-11-27 MED ORDER — ZOLPIDEM TARTRATE ER 6.25 MG PO TBCR: 6.2500 mg | EXTENDED_RELEASE_TABLET | Freq: Every evening | ORAL | Status: DC | PRN

## 2012-11-27 NOTE — Telephone Encounter (Signed)
Orders placed.

## 2012-11-27 NOTE — Telephone Encounter (Signed)
Message copied by Shirlee More I on Wed Nov 27, 2012  9:47 AM ------      Message from: Willow Ora E      Created: Wed Nov 27, 2012  8:02 AM       Send a prescription for Ambien CR 6.25 1 by mouth each bedtime when necessary #30 and 4 RF,   no need for call the patient, she knows ------

## 2012-12-05 ENCOUNTER — Telehealth: Payer: Self-pay | Admitting: Gastroenterology

## 2012-12-06 NOTE — Telephone Encounter (Signed)
Pt scheduled for 01/10/13 she will call if she develops any real trouble swallowing, she only has a sensation of a lump in her throat no swallowing issues.

## 2012-12-06 NOTE — Telephone Encounter (Signed)
Family member will have her call as soon as available

## 2012-12-17 ENCOUNTER — Other Ambulatory Visit (HOSPITAL_BASED_OUTPATIENT_CLINIC_OR_DEPARTMENT_OTHER): Payer: Self-pay | Admitting: Orthopaedic Surgery

## 2012-12-17 DIAGNOSIS — M25531 Pain in right wrist: Secondary | ICD-10-CM

## 2012-12-24 ENCOUNTER — Ambulatory Visit (HOSPITAL_BASED_OUTPATIENT_CLINIC_OR_DEPARTMENT_OTHER)
Admission: RE | Admit: 2012-12-24 | Discharge: 2012-12-24 | Disposition: A | Discharge status: Home / Self Care | Payer: Medicare Other | Source: Ambulatory Visit | Attending: Orthopaedic Surgery | Admitting: Orthopaedic Surgery

## 2012-12-24 DIAGNOSIS — S62009A Unspecified fracture of navicular [scaphoid] bone of unspecified wrist, initial encounter for closed fracture: Secondary | ICD-10-CM | POA: Insufficient documentation

## 2012-12-24 DIAGNOSIS — M19049 Primary osteoarthritis, unspecified hand: Secondary | ICD-10-CM | POA: Insufficient documentation

## 2012-12-24 DIAGNOSIS — W19XXXA Unspecified fall, initial encounter: Secondary | ICD-10-CM | POA: Insufficient documentation

## 2012-12-24 DIAGNOSIS — M25531 Pain in right wrist: Secondary | ICD-10-CM

## 2012-12-30 ENCOUNTER — Ambulatory Visit (HOSPITAL_BASED_OUTPATIENT_CLINIC_OR_DEPARTMENT_OTHER)
Admission: RE | Admit: 2012-12-30 | Discharge: 2012-12-30 | Disposition: A | Discharge status: Home / Self Care | Payer: Medicare Other | Source: Ambulatory Visit | Attending: Gynecology | Admitting: Gynecology

## 2012-12-30 DIAGNOSIS — Z1231 Encounter for screening mammogram for malignant neoplasm of breast: Secondary | ICD-10-CM

## 2013-01-07 ENCOUNTER — Ambulatory Visit (INDEPENDENT_AMBULATORY_CARE_PROVIDER_SITE_OTHER): Payer: Medicare Other

## 2013-01-07 DIAGNOSIS — Z23 Encounter for immunization: Secondary | ICD-10-CM

## 2013-01-10 ENCOUNTER — Ambulatory Visit: Payer: Medicare Other | Admitting: Gastroenterology

## 2013-01-14 ENCOUNTER — Other Ambulatory Visit: Payer: Self-pay | Admitting: Gynecology

## 2013-01-14 DIAGNOSIS — M8580 Other specified disorders of bone density and structure, unspecified site: Secondary | ICD-10-CM

## 2013-02-05 ENCOUNTER — Other Ambulatory Visit: Payer: Self-pay | Admitting: *Deleted

## 2013-02-05 ENCOUNTER — Telehealth: Payer: Self-pay | Admitting: Internal Medicine

## 2013-02-05 MED ORDER — HYDROCODONE-ACETAMINOPHEN 5-325 MG PO TABS: ORAL_TABLET | ORAL | Status: DC

## 2013-02-05 NOTE — Telephone Encounter (Signed)
Patient called to request a refill for HYDROcodone-acetaminophen (NORCO/VICODIN) 5-325 MG per tablet thanks

## 2013-02-05 NOTE — Telephone Encounter (Signed)
rx refill request -hydrocodone 5-325mg   Last ov - 08/02/12  Last refilled- 10/27/12 #100 / 3 rf  UDS low- 08/02/12   'please advise- DJR

## 2013-02-05 NOTE — Telephone Encounter (Addendum)
We are now unable to put RFs on hydrocodone thus I'll prescribed #300 enough for 3 months

## 2013-02-05 NOTE — Addendum Note (Signed)
Addended by: Willow Ora E on: 02/05/2013 03:27 PM   Modules accepted: Orders

## 2013-02-07 ENCOUNTER — Ambulatory Visit
Admission: RE | Admit: 2013-02-07 | Discharge: 2013-02-07 | Disposition: A | Discharge status: Home / Self Care | Payer: Medicare Other | Source: Ambulatory Visit | Attending: Gynecology | Admitting: Gynecology

## 2013-02-07 DIAGNOSIS — M8580 Other specified disorders of bone density and structure, unspecified site: Secondary | ICD-10-CM

## 2013-03-24 ENCOUNTER — Other Ambulatory Visit: Payer: Self-pay | Admitting: Internal Medicine

## 2013-03-24 NOTE — Telephone Encounter (Signed)
rx refilled per protocol. DJR  

## 2013-03-25 ENCOUNTER — Encounter (HOSPITAL_COMMUNITY): Payer: Self-pay | Admitting: *Deleted

## 2013-03-25 ENCOUNTER — Encounter: Payer: Self-pay | Admitting: Gastroenterology

## 2013-03-25 ENCOUNTER — Encounter (HOSPITAL_COMMUNITY): Payer: Self-pay | Admitting: Pharmacy Technician

## 2013-03-25 ENCOUNTER — Ambulatory Visit (INDEPENDENT_AMBULATORY_CARE_PROVIDER_SITE_OTHER): Payer: Medicare Other | Admitting: Gastroenterology

## 2013-03-25 VITALS — BP 126/72 | HR 80 | Ht 66.0 in | Wt 162.0 lb

## 2013-03-25 DIAGNOSIS — R1310 Dysphagia, unspecified: Secondary | ICD-10-CM

## 2013-03-25 DIAGNOSIS — K219 Gastro-esophageal reflux disease without esophagitis: Secondary | ICD-10-CM

## 2013-03-25 NOTE — Progress Notes (Signed)
Review of pertinent gastrointestinal problems: 1. Adenomatous colon polyps.  2010 colonoscopy [name] (several polyps);  Repeat colonoscoyp 04/2012 three small polyps, two were adenomas; recommended recall at 5 year interval. 2. EGD Dr. George Orr 2007; done for GERD, biopsy of GE junction showed no Barrett's.  HPI: This is a    very pleasant 67-year-old woman whom I last saw the time of a colonoscopy earlier this year. She is here today to see me for a new problem.  Never had pyrosis or GERD issues until recently. Certain foods, solid foods.  Began to worsen 2 months ago.  Can have burning in chest especially laying down.  Tried 2 week trial of OTC PPI, helps.  The pyrosis improved but dypshagia did not.  Overall her weight is down 50 pounds, in past year, then gained 10 back slowly but surely.  No abx recently  Drinks four caffeine drinks per day, no chocolate.  Never really has a lot of yeast infection.  No doxy in a year (was for rosacia).  boniva for 10 years, off of it now for 2 years.  Thrush when very young  Intermittent explosive diarrhea, certain foods only.  Cramping at times.  Review of systems: Pertinent positive and negative review of systems were noted in the above HPI section. Complete review of systems was performed and was otherwise normal.    Past Medical History  Diagnosis Date  . Hypothyroidism   . Migraine   . Osteopenia   . Colonic polyp     bx adenomatous polyps, next scope 2010  . Pleuritic chest pain     etiology unclear, has beeb eval by pulmonary and rheumatology (autoimmune dz?)  . Depression   . Chest pain     stress test @ [place] [facility] (-)  . Fibromyalgia     dx by Dr.Rowe  . Ocular rosacea 03/2009    on doxy    Past Surgical History  Procedure Laterality Date  . Tonsillectomy      Current Outpatient Prescriptions  Medication Sig Dispense Refill  . conjugated estrogens (PREMARIN) vaginal cream Place vaginally once a week.         . HYDROcodone-acetaminophen (NORCO/VICODIN) 5-325 MG per tablet TAKE 1 TABLET BY MOUTH EVERY 6 HOURS AS NEEDED FOR PAIN  300 tablet  0  . Multiple Vitamin (MULTIVITAMIN) capsule Take 1 capsule by mouth daily.        . polycarbophil (FIBERCON) 625 MG tablet Take 625 mg by mouth daily.        . Probiotic Product (ADVANCED PROBIOTIC 10 PO) Take 2 capsules by mouth daily.      . RESTASIS 0.05 % ophthalmic emulsion       . SYNTHROID 88 MCG tablet TAKE 1 TABLET EVERY DAY  90 tablet  1  . zolpidem (AMBIEN CR) 6.25 MG CR tablet Take 1 tablet (6.25 mg total) by mouth at bedtime as needed for sleep.  30 tablet  4   No current facility-administered medications for this visit.    Allergies as of 03/25/2013 - Review Complete 03/25/2013  Allergen Reaction Noted  . Alendronate sodium  04/27/2006  . Bactrim [sulfamethoxazole-trimethoprim]  01/25/2012  . Milnacipran  08/13/2008  . Penicillins  04/27/2006    Family History  Problem Relation Age of Onset  . Adopted: Yes  . Colon cancer Neg Hx     History   Social History  . Marital Status: Married    Spouse Name: N/A    Number of Children:   N/A  . Years of Education: N/A   Occupational History  . Not on file.   Social History Main Topics  . Smoking status: Former Smoker  . Smokeless tobacco: Never Used  . Alcohol Use: Yes     Comment: rare  . Drug Use: Not on file  . Sexual Activity: Not on file   Other Topics Concern  . Not on file   Social History Narrative   No biological children   Diet- a lot better, continue to loose weight   Exercise- some              Physical Exam: BP 126/72  Pulse 80  Ht 5' 6" (1.676 m)  Wt 162 lb (73.483 kg)  BMI 26.16 kg/m2 Constitutional: generally well-appearing Psychiatric: alert and oriented x3 Eyes: extraocular movements intact Mouth: oral pharynx moist, no lesions Neck: supple no lymphadenopathy Cardiovascular: heart regular rate and rhythm Lungs: clear to auscultation  bilaterally Abdomen: soft, nontender, nondistended, no obvious ascites, no peritoneal signs, normal bowel sounds Extremities: no lower extremity edema bilaterally Skin: no lesions on visible extremities    Assessment and plan: 67 y.o. female with  intermittent pyrosis, intermittent solid food dysphagia,  She has had weight loss as well but it was intentional and she has actually started regaining some weight most recently. Her pyrosis improved with daily proton pump inhibitor however her dysphagia did not improve. I recommended we proceed with EGD to evaluate for stricture, neoplasm. In the meantime she will restart proton pump inhibitor once daily.      

## 2013-03-25 NOTE — Patient Instructions (Addendum)
You will be set up for an upper endoscopy for dysphagia (hospital [facility], MAC sedation). Restart once daily OTC omeprazole, best 20-30 min before a meal. Try to refrain from caffeine after 3pm.

## 2013-03-27 ENCOUNTER — Encounter (HOSPITAL_COMMUNITY)
Admission: RE | Disposition: A | Discharge status: Home / Self Care | Payer: Self-pay | Source: Ambulatory Visit | Attending: Gastroenterology

## 2013-03-27 ENCOUNTER — Ambulatory Visit (HOSPITAL_COMMUNITY)
Admission: RE | Admit: 2013-03-27 | Discharge: 2013-03-27 | Disposition: A | Discharge status: Home / Self Care | Payer: Medicare Other | Source: Ambulatory Visit | Attending: Gastroenterology | Admitting: Gastroenterology

## 2013-03-27 ENCOUNTER — Encounter (HOSPITAL_COMMUNITY): Payer: Medicare Other | Admitting: Anesthesiology

## 2013-03-27 ENCOUNTER — Ambulatory Visit (HOSPITAL_COMMUNITY): Payer: Medicare Other | Admitting: Anesthesiology

## 2013-03-27 ENCOUNTER — Encounter (HOSPITAL_COMMUNITY): Payer: Self-pay | Admitting: *Deleted

## 2013-03-27 DIAGNOSIS — Z87891 Personal history of nicotine dependence: Secondary | ICD-10-CM | POA: Insufficient documentation

## 2013-03-27 DIAGNOSIS — K219 Gastro-esophageal reflux disease without esophagitis: Secondary | ICD-10-CM

## 2013-03-27 DIAGNOSIS — R1314 Dysphagia, pharyngoesophageal phase: Secondary | ICD-10-CM

## 2013-03-27 DIAGNOSIS — R109 Unspecified abdominal pain: Secondary | ICD-10-CM | POA: Insufficient documentation

## 2013-03-27 DIAGNOSIS — D126 Benign neoplasm of colon, unspecified: Secondary | ICD-10-CM | POA: Insufficient documentation

## 2013-03-27 DIAGNOSIS — R197 Diarrhea, unspecified: Secondary | ICD-10-CM | POA: Insufficient documentation

## 2013-03-27 DIAGNOSIS — R1310 Dysphagia, unspecified: Secondary | ICD-10-CM | POA: Insufficient documentation

## 2013-03-27 HISTORY — PX: ESOPHAGOGASTRODUODENOSCOPY (EGD) WITH PROPOFOL: SHX5813

## 2013-03-27 SURGERY — ESOPHAGOGASTRODUODENOSCOPY (EGD) WITH PROPOFOL
Anesthesia: Monitor Anesthesia Care

## 2013-03-27 MED ORDER — PROPOFOL 10 MG/ML IV BOLUS
INTRAVENOUS | Status: AC
  Filled 2013-03-27: qty 20

## 2013-03-27 MED ORDER — SODIUM CHLORIDE 0.9 % IV SOLN: INTRAVENOUS | Status: DC

## 2013-03-27 MED ORDER — FENTANYL CITRATE 0.05 MG/ML IJ SOLN
INTRAMUSCULAR | Status: AC
  Filled 2013-03-27: qty 2

## 2013-03-27 MED ORDER — FENTANYL CITRATE 0.05 MG/ML IJ SOLN: 25.0000 ug | INTRAMUSCULAR | Status: DC | PRN

## 2013-03-27 MED ORDER — MIDAZOLAM HCL 5 MG/5ML IJ SOLN
INTRAMUSCULAR | Status: DC | PRN
  Administered 2013-03-27 (×2): 1 mg via INTRAVENOUS

## 2013-03-27 MED ORDER — MIDAZOLAM HCL 2 MG/2ML IJ SOLN
INTRAMUSCULAR | Status: AC
  Filled 2013-03-27: qty 2

## 2013-03-27 MED ORDER — LACTATED RINGERS IV SOLN
INTRAVENOUS | Status: DC
  Administered 2013-03-27: 1000 mL via INTRAVENOUS

## 2013-03-27 MED ORDER — FENTANYL CITRATE 0.05 MG/ML IJ SOLN
INTRAMUSCULAR | Status: DC | PRN
  Administered 2013-03-27: 50 ug via INTRAVENOUS

## 2013-03-27 MED ORDER — PROPOFOL 10 MG/ML IV BOLUS
INTRAVENOUS | Status: DC | PRN
  Administered 2013-03-27: 40 mg via INTRAVENOUS

## 2013-03-27 MED ORDER — BUTAMBEN-TETRACAINE-BENZOCAINE 2-2-14 % EX AERO
INHALATION_SPRAY | CUTANEOUS | Status: DC | PRN
  Administered 2013-03-27: 2 via TOPICAL

## 2013-03-27 SURGICAL SUPPLY — 15 items

## 2013-03-27 NOTE — Op Note (Signed)
[facility] [address] [place], [address]   ENDOSCOPY PROCEDURE REPORT  PATIENT: Michelle, Black  MR#: [id number] BIRTHDATE: [birth year] , 67  yrs. old GENDER: Female ENDOSCOPIST: Rachael Fee, MD PROCEDURE DATE:  03/27/2013 PROCEDURE:  EGD, diagnostic ASA CLASS:     Class II INDICATIONS:  GERD, intermittent dysphagia. MEDICATIONS: MAC sedation, administered by CRNA TOPICAL ANESTHETIC: none  DESCRIPTION OF PROCEDURE: After the risks benefits and alternatives of the procedure were thoroughly explained, informed consent was obtained.  The Pentax [id number] 3.8 [id number] endoscope was introduced through the mouth and advanced to the second portion of the duodenum. Without limitations.  The instrument was slowly withdrawn as the mucosa was fully examined.     The upper, middle and distal third of the esophagus were carefully inspected and no abnormalities were noted.  The z-line was well seen at the GEJ.  The endoscope was pushed into the fundus which was normal including a retroflexed view.  The antrum, gastric body, first and second part of the duodenum were unremarkable. Retroflexed views revealed no abnormalities.     The scope was then withdrawn from the patient and the procedure completed. COMPLICATIONS: There were no complications.  ENDOSCOPIC IMPRESSION: Normal EGD  RECOMMENDATIONS: You should continue your antiacid medicine, one pill once daily (best taken 20-30 min prior to a meal, usually breakfast).  Chew your food well, eat slowly and take small bites.  Call Dr.  Christella Hartigan' office in 4 weeks to report on your GERD and swallowing symptoms.   eSigned:  Rachael Fee, MD 03/27/2013 11:36 AM

## 2013-03-27 NOTE — Anesthesia Postprocedure Evaluation (Signed)
  Anesthesia Post-op Note  Patient: [name]  Procedure(s) Performed: Procedure(s) (LRB): ESOPHAGOGASTRODUODENOSCOPY (EGD) WITH PROPOFOL (N/A)  Patient Location: PACU  Anesthesia Type: MAC  Level of Consciousness: awake and alert   Airway and Oxygen Therapy: Patient Spontanous Breathing  Post-op Pain: mild  Post-op Assessment: Post-op Vital signs reviewed, Patient's Cardiovascular Status Stable, Respiratory Function Stable, Patent Airway and No signs of Nausea or vomiting  Last Vitals:  Filed Vitals:   03/27/13 1156  BP: 124/74  Pulse: 69  Temp:   Resp: 14    Post-op Vital Signs: stable   Complications: No apparent anesthesia complications

## 2013-03-27 NOTE — Anesthesia Preprocedure Evaluation (Addendum)
Anesthesia Evaluation  Patient identified by MRN, date of birth, ID band Patient awake    Reviewed: Allergy & Precautions, H&P , NPO status , Patient's Chart, lab work & pertinent test results  Airway Mallampati: II TM Distance: >3 FB Neck ROM: full    Dental no notable dental hx. (+) Teeth Intact and Dental Advisory Given   Pulmonary neg pulmonary ROS, former smoker,  breath sounds clear to auscultation  Pulmonary exam normal       Cardiovascular Exercise Tolerance: Good negative cardio ROS  Rhythm:regular Rate:Normal  Negative nuclear stress test   Neuro/Psych  Headaches, Depression negative neurological ROS  negative psych ROS   GI/Hepatic negative GI ROS, Neg liver ROS, GERD-  Medicated and Controlled,  Endo/Other  negative endocrine ROSHypothyroidism   Renal/GU negative Renal ROS  negative genitourinary   Musculoskeletal  (+) Fibromyalgia -  Abdominal   Peds  Hematology negative hematology ROS (+)   Anesthesia Other Findings Autoimmune problem causing pleuritic chest pains  Reproductive/Obstetrics negative OB ROS                         Anesthesia Physical Anesthesia Plan  ASA: II  Anesthesia Plan: MAC   Post-op Pain Management:    Induction:   Airway Management Planned: Simple Face Mask  Additional Equipment:   Intra-op Plan:   Post-operative Plan:   Informed Consent: I have reviewed the patients History and Physical, chart, labs and discussed the procedure including the risks, benefits and alternatives for the proposed anesthesia with the patient or authorized representative who has indicated his/her understanding and acceptance.   Dental Advisory Given  Plan Discussed with: CRNA and Surgeon  Anesthesia Plan Comments:         Anesthesia Quick Evaluation

## 2013-03-27 NOTE — Transfer of Care (Signed)
Immediate Anesthesia Transfer of Care Note  Patient: [name]  Procedure(s) Performed: Procedure(s) with comments: ESOPHAGOGASTRODUODENOSCOPY (EGD) WITH PROPOFOL (N/A) - possible dil  Patient Location: PACU and Endoscopy Unit  Anesthesia Type:MAC  Level of Consciousness: awake, oriented, patient cooperative, lethargic and responds to stimulation  Airway & Oxygen Therapy: Patient Spontanous Breathing and Patient connected to nasal cannula oxygen  Post-op Assessment: Report given to PACU RN, Post -op Vital signs reviewed and stable and Patient moving all extremities  Post vital signs: Reviewed and stable  Complications: No apparent anesthesia complications

## 2013-03-27 NOTE — H&P (View-Only) (Signed)
Review of pertinent gastrointestinal problems: 1. Adenomatous colon polyps.  2010 colonoscopy [name] (several polyps);  Repeat colonoscoyp 04/2012 three small polyps, two were adenomas; recommended recall at 5 year interval. 2. EGD Dr. Sabino Gasser 2007; done for GERD, biopsy of GE junction showed no Barrett's.  HPI: This is a    very pleasant 67 year old woman whom I last saw the time of a colonoscopy earlier this year. She is here today to see me for a new problem.  Never had pyrosis or GERD issues until recently. Certain foods, solid foods.  Began to worsen 2 months ago.  Can have burning in chest especially laying down.  Tried 2 week trial of OTC PPI, helps.  The pyrosis improved but dypshagia did not.  Overall her weight is down 50 pounds, in past year, then gained 10 back slowly but surely.  No abx recently  Drinks four caffeine drinks per day, no chocolate.  Never really has a lot of yeast infection.  No doxy in a year (was for rosacia).  boniva for 10 years, off of it now for 2 years.  Thrush when very young  Intermittent explosive diarrhea, certain foods only.  Cramping at times.  Review of systems: Pertinent positive and negative review of systems were noted in the above HPI section. Complete review of systems was performed and was otherwise normal.    Past Medical History  Diagnosis Date  . Hypothyroidism   . Migraine   . Osteopenia   . Colonic polyp     bx adenomatous polyps, next scope 2010  . Pleuritic chest pain     etiology unclear, has beeb eval by pulmonary and rheumatology (autoimmune dz?)  . Depression   . Chest pain     stress test @ [facility] (-)  . Fibromyalgia     dx by Dr.Rowe  . Ocular rosacea 03/2009    on doxy    Past Surgical History  Procedure Laterality Date  . Tonsillectomy      Current Outpatient Prescriptions  Medication Sig Dispense Refill  . conjugated estrogens (PREMARIN) vaginal cream Place vaginally once a week.         Marland Kitchen HYDROcodone-acetaminophen (NORCO/VICODIN) 5-325 MG per tablet TAKE 1 TABLET BY MOUTH EVERY 6 HOURS AS NEEDED FOR PAIN  300 tablet  0  . Multiple Vitamin (MULTIVITAMIN) capsule Take 1 capsule by mouth daily.        . polycarbophil (FIBERCON) 625 MG tablet Take 625 mg by mouth daily.        . Probiotic Product (ADVANCED PROBIOTIC 10 PO) Take 2 capsules by mouth daily.      . RESTASIS 0.05 % ophthalmic emulsion       . SYNTHROID 88 MCG tablet TAKE 1 TABLET EVERY DAY  90 tablet  1  . zolpidem (AMBIEN CR) 6.25 MG CR tablet Take 1 tablet (6.25 mg total) by mouth at bedtime as needed for sleep.  30 tablet  4   No current facility-administered medications for this visit.    Allergies as of 03/25/2013 - Review Complete 03/25/2013  Allergen Reaction Noted  . Alendronate sodium  04/27/2006  . Bactrim [sulfamethoxazole-trimethoprim]  01/25/2012  . Milnacipran  08/13/2008  . Penicillins  04/27/2006    Family History  Problem Relation Age of Onset  . Adopted: Yes  . Colon cancer Neg Hx     History   Social History  . Marital Status: Married    Spouse Name: N/A    Number of Children:  N/A  . Years of Education: N/A   Occupational History  . Not on file.   Social History Main Topics  . Smoking status: Former Games developer  . Smokeless tobacco: Never Used  . Alcohol Use: Yes     Comment: rare  . Drug Use: Not on file  . Sexual Activity: Not on file   Other Topics Concern  . Not on file   Social History Narrative   No biological children   Diet- a lot better, continue to loose weight   Exercise- some              Physical Exam: BP 126/72  Pulse 80  Ht 5\' 6"  (1.676 m)  Wt 162 lb (73.483 kg)  BMI 26.16 kg/m2 Constitutional: generally well-appearing Psychiatric: alert and oriented x3 Eyes: extraocular movements intact Mouth: oral pharynx moist, no lesions Neck: supple no lymphadenopathy Cardiovascular: heart regular rate and rhythm Lungs: clear to auscultation  bilaterally Abdomen: soft, nontender, nondistended, no obvious ascites, no peritoneal signs, normal bowel sounds Extremities: no lower extremity edema bilaterally Skin: no lesions on visible extremities    Assessment and plan: 67 y.o. female with  intermittent pyrosis, intermittent solid food dysphagia,  She has had weight loss as well but it was intentional and she has actually started regaining some weight most recently. Her pyrosis improved with daily proton pump inhibitor however her dysphagia did not improve. I recommended we proceed with EGD to evaluate for stricture, neoplasm. In the meantime she will restart proton pump inhibitor once daily.

## 2013-03-27 NOTE — Preoperative (Addendum)
Beta Blockers   Reason not to administer Beta Blockers:Not Applicable 

## 2013-03-27 NOTE — Interval H&P Note (Signed)
History and Physical Interval Note:  03/27/2013 11:11 AM  [name]  has presented today for surgery, with the diagnosis of gerd  The various methods of treatment have been discussed with the patient and family. After consideration of risks, benefits and other options for treatment, the patient has consented to  Procedure(s) with comments: ESOPHAGOGASTRODUODENOSCOPY (EGD) WITH PROPOFOL (N/A) - possible dil BALLOON DILATION (N/A) as a surgical intervention .  The patient's history has been reviewed, patient examined, no change in status, stable for surgery.  I have reviewed the patient's chart and labs.  Questions were answered to the patient's satisfaction.     Rachael Fee

## 2013-03-27 NOTE — Transfer of Care (Signed)
Immediate Anesthesia Transfer of Care Note  Patient: [name]  Procedure(s) Performed: Procedure(s) with comments: ESOPHAGOGASTRODUODENOSCOPY (EGD) WITH PROPOFOL (N/A) - possible dil  Patient Location: PACU and Endoscopy Unit  Anesthesia Type:MAC  Level of Consciousness: awake, oriented, patient cooperative, lethargic and responds to stimulation  Airway & Oxygen Therapy: Patient Spontanous Breathing and Patient connected to nasal cannula oxygen  Post-op Assessment: Report given to PACU RN, Post -op Vital signs reviewed and stable and Patient moving all extremities  Post vital signs: Reviewed and stable  Complications: No apparent anesthesia complications 

## 2013-03-28 ENCOUNTER — Encounter (HOSPITAL_COMMUNITY): Payer: Self-pay | Admitting: Gastroenterology

## 2013-05-17 ENCOUNTER — Encounter: Payer: Self-pay | Admitting: Internal Medicine

## 2013-05-19 ENCOUNTER — Telehealth: Payer: Self-pay | Admitting: *Deleted

## 2013-05-19 MED ORDER — HYDROCODONE-ACETAMINOPHEN 5-325 MG PO TABS: 1.0000 | ORAL_TABLET | Freq: Four times a day (QID) | ORAL | Status: DC | PRN

## 2013-05-19 NOTE — Telephone Encounter (Signed)
done

## 2013-05-19 NOTE — Addendum Note (Signed)
Addended by: Kathlene November E on: 05/19/2013 11:17 AM   Modules accepted: Orders

## 2013-05-19 NOTE — Telephone Encounter (Signed)
rx refill - hydrocodone 5-325mg  Last OV- 08/02/12 Last refilled- 02/05/13 #300 /0 rf Last UDS- 08/02/12 LOW

## 2013-05-20 ENCOUNTER — Encounter: Payer: Self-pay | Admitting: Internal Medicine

## 2013-05-21 ENCOUNTER — Other Ambulatory Visit: Payer: Self-pay | Admitting: Internal Medicine

## 2013-05-21 MED ORDER — HYDROCODONE-ACETAMINOPHEN 5-325 MG PO TABS: 1.0000 | ORAL_TABLET | Freq: Four times a day (QID) | ORAL | Status: DC | PRN

## 2013-08-06 ENCOUNTER — Encounter: Payer: Medicare Other | Admitting: Internal Medicine

## 2013-08-07 ENCOUNTER — Telehealth: Payer: Self-pay

## 2013-08-07 NOTE — Telephone Encounter (Signed)
Medication and allergies:  Reviewed and updated  90 day supply/mail order: na Local pharmacy: [facility]   Immunizations due:    A/P:   No changes to FH, PSH or Personal Hx  TD 2007  Pneumonia shot 2006 and 2013  Had a shingles immunization  Cscope 11-2005: polyps-adenomatous, repeated colonoscopy 11/2008 and 04-2012, next in 5 years.  sees Gyn  PAP--12/2011--neg MMG--12/2012--neg Bi Rads BD--02/2013---osteoporosis   To Discuss with Provider: Where to go from here on the back pain

## 2013-08-08 ENCOUNTER — Ambulatory Visit (HOSPITAL_BASED_OUTPATIENT_CLINIC_OR_DEPARTMENT_OTHER)
Admission: RE | Admit: 2013-08-08 | Discharge: 2013-08-08 | Disposition: A | Discharge status: Home / Self Care | Payer: Medicare Other | Source: Ambulatory Visit | Attending: Internal Medicine | Admitting: Internal Medicine

## 2013-08-08 ENCOUNTER — Ambulatory Visit (INDEPENDENT_AMBULATORY_CARE_PROVIDER_SITE_OTHER): Payer: Medicare Other | Admitting: Internal Medicine

## 2013-08-08 ENCOUNTER — Encounter: Payer: Self-pay | Admitting: Internal Medicine

## 2013-08-08 VITALS — BP 121/77 | HR 68 | Temp 97.8°F | Ht 66.1 in | Wt 164.0 lb

## 2013-08-08 DIAGNOSIS — Z0000 Encounter for general adult medical examination without abnormal findings: Secondary | ICD-10-CM

## 2013-08-08 DIAGNOSIS — M549 Dorsalgia, unspecified: Secondary | ICD-10-CM

## 2013-08-08 DIAGNOSIS — Z13220 Encounter for screening for lipoid disorders: Secondary | ICD-10-CM

## 2013-08-08 DIAGNOSIS — E039 Hypothyroidism, unspecified: Secondary | ICD-10-CM

## 2013-08-08 DIAGNOSIS — M810 Age-related osteoporosis without current pathological fracture: Secondary | ICD-10-CM

## 2013-08-08 DIAGNOSIS — IMO0001 Reserved for inherently not codable concepts without codable children: Secondary | ICD-10-CM

## 2013-08-08 DIAGNOSIS — Z23 Encounter for immunization: Secondary | ICD-10-CM

## 2013-08-08 LAB — CBC WITH DIFFERENTIAL/PLATELET
BASOS ABS: 0 10*3/uL (ref 0.0–0.1)
Basophils Relative: 0.5 % (ref 0.0–3.0)
EOS ABS: 0.3 10*3/uL (ref 0.0–0.7)
Eosinophils Relative: 4.5 % (ref 0.0–5.0)
HCT: 40.2 % (ref 36.0–46.0)
HEMOGLOBIN: 13.4 g/dL (ref 12.0–15.0)
LYMPHS ABS: 1.7 10*3/uL (ref 0.7–4.0)
Lymphocytes Relative: 25.6 % (ref 12.0–46.0)
MCHC: 33.2 g/dL (ref 30.0–36.0)
MCV: 93.3 fl (ref 78.0–100.0)
MONO ABS: 0.7 10*3/uL (ref 0.1–1.0)
MONOS PCT: 10.8 % (ref 3.0–12.0)
NEUTROS ABS: 3.9 10*3/uL (ref 1.4–7.7)
Neutrophils Relative %: 58.6 % (ref 43.0–77.0)
Platelets: 264 10*3/uL (ref 150.0–400.0)
RBC: 4.31 Mil/uL (ref 3.87–5.11)
RDW: 13.6 % (ref 11.5–14.6)
WBC: 6.7 10*3/uL (ref 4.5–10.5)

## 2013-08-08 LAB — COMPREHENSIVE METABOLIC PANEL
ALT: 14 U/L (ref 0–35)
AST: 16 U/L (ref 0–37)
Albumin: 3.9 g/dL (ref 3.5–5.2)
Alkaline Phosphatase: 58 U/L (ref 39–117)
BUN: 12 mg/dL (ref 6–23)
CO2: 29 mEq/L (ref 19–32)
CREATININE: 0.7 mg/dL (ref 0.4–1.2)
Calcium: 9.2 mg/dL (ref 8.4–10.5)
Chloride: 105 mEq/L (ref 96–112)
GFR: 85.76 mL/min (ref >60.00)
Glucose, Bld: 75 mg/dL (ref 70–99)
Potassium: 3.7 mEq/L (ref 3.5–5.1)
SODIUM: 141 meq/L (ref 135–145)
TOTAL PROTEIN: 7 g/dL (ref 6.0–8.3)
Total Bilirubin: 0.5 mg/dL (ref 0.3–1.2)

## 2013-08-08 LAB — LIPID PANEL
CHOL/HDL RATIO: 4
Cholesterol: 179 mg/dL (ref 0–200)
HDL: 50.8 mg/dL (ref >39.00)
LDL Cholesterol: 108 mg/dL — ABNORMAL HIGH (ref 0–99)
Triglycerides: 99 mg/dL (ref 0.0–149.0)
VLDL: 19.8 mg/dL (ref 0.0–40.0)

## 2013-08-08 LAB — TSH: TSH: 0.47 u[IU]/mL (ref 0.35–5.50)

## 2013-08-08 MED ORDER — HYDROCODONE-ACETAMINOPHEN 5-325 MG PO TABS: 1.0000 | ORAL_TABLET | Freq: Four times a day (QID) | ORAL | Status: DC | PRN

## 2013-08-08 NOTE — Progress Notes (Signed)
Subjective:    Patient ID: [name], female    DOB: 1945-08-05, 68 y.o.   MRN: [id number]  DOS:  08/08/2013 Type of  visit:   Here for Medicare AWV: 1. Risk factors based on Past M, S, F history: reviewed   2. Physical Activities: takes walks w/ the dog, home chores, some yard work 3. Depression/mood: (-) screening   4. Hearing: decreased hearing ,  using hearing aids     5. ADL's: totally independent , drives   6. Fall Risk: low risk, normal balance , see instructions   7. Home Safety: does feel safe at home   8. Height, weight, &visual acuity: see VS,sees eye doctor regulalrly  9. Counseling:yes   10.Labs ordered based on risk factors:  if needed   11. Referral Coordination, if needed   12. Care Plan, see a/p 13. Cognitive Assessment: motor skill, coordination and cognition appropiate    In addition we discussed the following  Thyroid- good medication compliance. Back pain, going on for years, located at the thoracic spine, gradually getting slightly worse. No radiation, no upper or lower extremity paresthesias History of fibromyalgia, currently taking pain medication mostly for back pain. Osteoporosis, had a bone density test a few months ago, already discussed with rheumatology, see assessment and plan. Insomnia, on Ambien as needed.sometimes , sleep is interrupted mostly by nocturia. Denies daytime fatigue, occasionally snores        ROS  No  CP, SOB No palpitations, no lower extremity edema Denies  nausea, vomiting diarrhea No abdominal pain Denies  blood in the stools (-) cough, sputum production (-) wheezing, chest congestion No dysuria, gross hematuria, difficulty urinating     Past Medical History  Diagnosis Date  . Hypothyroidism   . Migraine   . Osteopenia   . Colonic polyp     bx adenomatous polyps, next scope 2010  . Pleuritic chest pain     etiology unclear, has beeb eval by pulmonary and rheumatology (autoimmune dz?)  . Depression   . Chest  pain     stress test @ [facility] cardiology (-)  . Fibromyalgia     see rheumatology  . Ocular rosacea 03/2009    on doxy    Past Surgical History  Procedure Laterality Date  . Tonsillectomy    . Right rotator cuff repair  2010  . 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  . Esophagogastroduodenoscopy (egd) with propofol N/A 03/27/2013    Procedure: ESOPHAGOGASTRODUODENOSCOPY (EGD) WITH PROPOFOL;  Surgeon: Milus Banister, MD;  Location: [facility];  Service: Endoscopy;  Laterality: N/A;  possible dil  . Plastic surgery face  2013    History   Social History  . Marital Status: Married    Spouse Name: N/A    Number of Children: 0  . Years of Education: N/A   Occupational History  . retired, Medical sales representative work    Social History Main Topics  . Smoking status: Former Smoker    Types: Cigarettes    Quit date: 01/13/2004  . Smokeless tobacco: Never Used     Comment: quit 2005  . Alcohol Use: Yes     Comment: rare  . Drug Use: No  . Sexual Activity: Not on file   Other Topics Concern  . Not on file   Social History Narrative   No biological children           Family History  Problem Relation Age of Onset  .  Adopted: Yes  . Colon cancer Neg Hx        Medication List       This list is accurate as of: 08/08/13  5:02 PM.  Always use your most recent med list.               ADVANCED PROBIOTIC 10 PO  Take 2 capsules by mouth daily.     conjugated estrogens vaginal cream  Commonly known as:  PREMARIN  Place 1 Applicatorful vaginally 2 (two) times a week.     HYDROcodone-acetaminophen 5-325 MG per tablet  Commonly known as:  NORCO/VICODIN  Take 1 tablet by mouth every 6 (six) hours as needed for moderate pain.     levothyroxine 88 MCG tablet  Commonly known as:  SYNTHROID, LEVOTHROID  Take 88 mcg by mouth daily before breakfast.     multivitamin capsule  Take 1 capsule by mouth daily.     omeprazole 20 MG tablet  Commonly known as:  PRILOSEC OTC  Take  20 mg by mouth daily.     polycarbophil 625 MG tablet  Commonly known as:  FIBERCON  Take 625 mg by mouth daily.     RESTASIS 0.05 % ophthalmic emulsion  Generic drug:  cycloSPORINE  Place 1 drop into both eyes 2 (two) times daily.     zolpidem 6.25 MG CR tablet  Commonly known as:  AMBIEN CR  Take 1 tablet (6.25 mg total) by mouth at bedtime as needed for sleep.           Objective:   Physical Exam BP 121/77  Pulse 68  Temp(Src) 97.8 F (36.6 C)  Ht 5' 6.1" (1.679 m)  Wt 164 lb (74.39 kg)  BMI 26.39 kg/m2  SpO2 100% General -- alert, well-developed, NAD.  Neck --no thyromegaly   HEENT-- Not pale.  Lungs -- normal respiratory effort, no intercostal retractions, no accessory muscle use, and normal breath sounds.  Heart-- normal rate, regular rhythm, no murmur.  Abdomen-- Not distended, good bowel sounds,soft, non-tender. Palpable, not tender Ao Extremities-- no pretibial edema bilaterally  Neurologic--  alert & oriented X3. Speech normal, gait normal, strength normal in all extremities.  Psych-- Cognition and judgment appear intact. Cooperative with normal attention span and concentration. No anxious or depressed appearing.       Assessment & Plan:

## 2013-08-08 NOTE — Assessment & Plan Note (Addendum)
Fibromyalgia relatively well controlled, several years history of thoracic back pain, history of traumatic vertebral fracture of the thoracic spine at age 68. Pain is gradually  getting worse but is not severe. Plan: X-ray, physical therapy, continue with pain meds prn

## 2013-08-08 NOTE — Assessment & Plan Note (Addendum)
TD 2007 Pneumonia shot 2006 and 2013; prevnar today Had a shingles immunization  Cscope 11-2005:  polyps-adenomatous, repeated  colonoscopy 11/2008 and 04-2012, next in 5 years.  sees Gyn , last OV 10-14  MMG--12/2012--neg Bi Rads Palpable aorta-- US neg for AAA 07-2012 diet and exercise discussed Labs

## 2013-08-08 NOTE — Patient Instructions (Signed)
Get your blood work before you leave   Get the XR at [place] and [address] (10 minutes form here); they are open 24/7 [place], [place] [address] [phone]   Next visit is for a physical exam in 1 year , fasting Please make an appointment    Fall Prevention and [place] cause injuries and can affect all age groups. It is possible to use preventive measures to significantly decrease the likelihood of falls. There are many simple measures which can make your home safer and prevent falls. OUTDOORS  Repair cracks and edges of walkways and driveways.  Remove high doorway thresholds.  Trim shrubbery on the main path into your home.  Have good outside lighting.  Clear walkways of tools, rocks, debris, and clutter.  Check that handrails are not broken and are securely fastened. Both sides of steps should have handrails.  Have leaves, snow, and ice cleared regularly.  Use sand or salt on walkways during winter months.  In the garage, clean up grease or oil spills. BATHROOM  Install night lights.  Install grab bars by the toilet and in the tub and shower.  Use non-skid mats or decals in the tub or shower.  Place a plastic non-slip stool in the shower to sit on, if needed.  Keep floors dry and clean up all water on the floor immediately.  Remove soap buildup in the tub or shower on a regular basis.  Secure bath mats with non-slip, double-sided rug tape.  Remove throw rugs and tripping hazards from the floors. BEDROOMS  Install night lights.  Make sure a bedside light is easy to reach.  Do not use oversized bedding.  Keep a telephone by your bedside.  Have a firm chair with side arms to use for getting dressed.  Remove throw rugs and tripping hazards from the floor. KITCHEN  Keep handles on pots and pans turned toward the center of the stove. Use back burners when possible.  Clean up spills  quickly and allow time for drying.  Avoid walking on wet floors.  Avoid hot utensils and knives.  Position shelves so they are not too high or low.  Place commonly used objects within easy reach.  If necessary, use a sturdy step stool with a grab bar when reaching.  Keep electrical cables out of the way.  Do not use floor polish or wax that makes floors slippery. If you must use wax, use non-skid floor wax.  Remove throw rugs and tripping hazards from the floor. STAIRWAYS  Never leave objects on stairs.  Place handrails on both sides of stairways and use them. Fix any loose handrails. Make sure handrails on both sides of the stairways are as long as the stairs.  Check carpeting to make sure it is firmly attached along stairs. Make repairs to worn or loose carpet promptly.  Avoid placing throw rugs at the top or bottom of stairways, or properly secure the rug with carpet tape to prevent slippage. Get rid of throw rugs, if possible.  Have an electrician put in a light switch at the top and bottom of the stairs. OTHER FALL PREVENTION TIPS  Wear low-heel or rubber-soled shoes that are supportive and fit well. Wear closed toe shoes.  When using a stepladder, make sure it is fully opened and both spreaders are firmly locked. Do not climb a closed stepladder.  Add color or contrast paint or  tape to grab bars and handrails in your home. Place contrasting color strips on first and last steps.  Learn and use mobility aids as needed. Install an electrical emergency response system.  Turn on lights to avoid dark areas. Replace light bulbs that burn out immediately. Get light switches that glow.  Arrange furniture to create clear pathways. Keep furniture in the same place.  Firmly attach carpet with non-skid or double-sided tape.  Eliminate uneven floor surfaces.  Select a carpet pattern that does not visually hide the edge of steps.  Be aware of all pets. OTHER HOME SAFETY  TIPS  Set the water temperature for 120 F (48.8 C).  Keep emergency numbers on or near the telephone.  Keep smoke detectors on every level of the home and near sleeping areas. Document Released: 03/17/2002 Document Revised: 09/26/2011 Document Reviewed: 06/16/2011 [facility] Patient Information 2014 [place].

## 2013-08-08 NOTE — Assessment & Plan Note (Signed)
Due for labs

## 2013-08-08 NOTE — Assessment & Plan Note (Addendum)
T score 10-14 was -2.6, she already discussed the issue at rheumatology and they agree to recheck in one year. Recommend calcium, vitamin D and exercise. (History of vertebral thoracic spine fracture at age 68, traumatic per pt)

## 2013-08-14 ENCOUNTER — Telehealth: Payer: Self-pay

## 2013-08-14 NOTE — Telephone Encounter (Signed)
UDS: 08/08/2013 Negative for Zolpidem (PRN) Per Dr Larose Kells Low Risk

## 2013-08-19 ENCOUNTER — Ambulatory Visit: Payer: Medicare Other | Attending: Internal Medicine | Admitting: Physical Therapy

## 2013-08-19 DIAGNOSIS — M549 Dorsalgia, unspecified: Secondary | ICD-10-CM | POA: Insufficient documentation

## 2013-08-19 DIAGNOSIS — Z9889 Other specified postprocedural states: Secondary | ICD-10-CM | POA: Diagnosis not present

## 2013-08-19 DIAGNOSIS — IMO0001 Reserved for inherently not codable concepts without codable children: Secondary | ICD-10-CM | POA: Diagnosis not present

## 2013-08-19 DIAGNOSIS — M546 Pain in thoracic spine: Secondary | ICD-10-CM | POA: Diagnosis not present

## 2013-08-19 DIAGNOSIS — M810 Age-related osteoporosis without current pathological fracture: Secondary | ICD-10-CM | POA: Insufficient documentation

## 2013-08-21 ENCOUNTER — Ambulatory Visit: Payer: Medicare Other | Admitting: Physical Therapy

## 2013-08-21 DIAGNOSIS — IMO0001 Reserved for inherently not codable concepts without codable children: Secondary | ICD-10-CM | POA: Diagnosis not present

## 2013-08-26 ENCOUNTER — Ambulatory Visit: Payer: Medicare Other | Admitting: Physical Therapy

## 2013-08-26 DIAGNOSIS — IMO0001 Reserved for inherently not codable concepts without codable children: Secondary | ICD-10-CM | POA: Diagnosis not present

## 2013-08-28 ENCOUNTER — Ambulatory Visit: Payer: Medicare Other | Admitting: Physical Therapy

## 2013-08-28 DIAGNOSIS — IMO0001 Reserved for inherently not codable concepts without codable children: Secondary | ICD-10-CM | POA: Diagnosis not present

## 2013-09-02 ENCOUNTER — Encounter: Payer: Self-pay | Admitting: Internal Medicine

## 2013-09-02 ENCOUNTER — Ambulatory Visit: Payer: Medicare Other | Admitting: Physical Therapy

## 2013-09-02 DIAGNOSIS — IMO0001 Reserved for inherently not codable concepts without codable children: Secondary | ICD-10-CM | POA: Diagnosis not present

## 2013-09-04 ENCOUNTER — Ambulatory Visit: Payer: Medicare Other | Admitting: Physical Therapy

## 2013-09-04 DIAGNOSIS — IMO0001 Reserved for inherently not codable concepts without codable children: Secondary | ICD-10-CM | POA: Diagnosis not present

## 2013-09-08 ENCOUNTER — Ambulatory Visit: Payer: Medicare Other | Admitting: Physical Therapy

## 2013-09-11 ENCOUNTER — Ambulatory Visit: Payer: Medicare Other | Admitting: Physical Therapy

## 2013-09-15 ENCOUNTER — Ambulatory Visit: Payer: Medicare Other | Admitting: Physical Therapy

## 2013-09-17 ENCOUNTER — Other Ambulatory Visit: Payer: Self-pay | Admitting: Internal Medicine

## 2013-09-18 ENCOUNTER — Ambulatory Visit: Payer: Medicare Other | Admitting: Physical Therapy

## 2013-09-22 ENCOUNTER — Ambulatory Visit: Payer: Medicare Other | Admitting: Physical Therapy

## 2013-11-13 ENCOUNTER — Telehealth: Payer: Self-pay | Admitting: *Deleted

## 2013-11-13 ENCOUNTER — Encounter: Payer: Self-pay | Admitting: Internal Medicine

## 2013-11-13 MED ORDER — HYDROCODONE-ACETAMINOPHEN 5-325 MG PO TABS: 1.0000 | ORAL_TABLET | Freq: Four times a day (QID) | ORAL | Status: DC | PRN

## 2013-11-13 NOTE — Telephone Encounter (Signed)
done

## 2013-11-13 NOTE — Addendum Note (Signed)
Addended by: Kathlene November E on: 11/13/2013 04:44 PM   Modules accepted: Orders

## 2013-11-13 NOTE — Telephone Encounter (Signed)
rx refill- hydro co done 5-32mg  Last OV- 08/08/13 Last refilled- 08/08/13 # 300 / 0 rf  UDS- Low risk.

## 2013-11-14 NOTE — Telephone Encounter (Signed)
Spoke with Pt, Rx placed at front desk for pick up.

## 2013-12-09 ENCOUNTER — Other Ambulatory Visit (HOSPITAL_BASED_OUTPATIENT_CLINIC_OR_DEPARTMENT_OTHER): Payer: Self-pay | Admitting: Gynecology

## 2013-12-09 DIAGNOSIS — Z1231 Encounter for screening mammogram for malignant neoplasm of breast: Secondary | ICD-10-CM

## 2014-01-02 ENCOUNTER — Ambulatory Visit (HOSPITAL_BASED_OUTPATIENT_CLINIC_OR_DEPARTMENT_OTHER)
Admission: RE | Admit: 2014-01-02 | Discharge: 2014-01-02 | Disposition: A | Discharge status: Home / Self Care | Payer: Medicare Other | Source: Ambulatory Visit | Attending: Gynecology | Admitting: Gynecology

## 2014-01-02 DIAGNOSIS — Z1231 Encounter for screening mammogram for malignant neoplasm of breast: Secondary | ICD-10-CM | POA: Diagnosis present

## 2014-01-06 ENCOUNTER — Telehealth: Payer: Self-pay

## 2014-01-06 NOTE — Telephone Encounter (Signed)
Left message for Pt that handicap permit paperwork is at front desk ready for pick up. Copy made and placed in folder to be scanned.

## 2014-02-06 ENCOUNTER — Telehealth: Payer: Self-pay | Admitting: Internal Medicine

## 2014-02-06 MED ORDER — HYDROCODONE-ACETAMINOPHEN 5-325 MG PO TABS: 1.0000 | ORAL_TABLET | Freq: Four times a day (QID) | ORAL | Status: DC | PRN

## 2014-02-06 NOTE — Telephone Encounter (Signed)
done

## 2014-02-06 NOTE — Telephone Encounter (Signed)
Pt is requesting refill on Hydrocodone  Last OV: 08/08/2013 Last Fill: 11/13/2013 # 300 0RF UDS: 08/08/2013 Low risk   Please advise.

## 2014-02-09 NOTE — Telephone Encounter (Signed)
Placed at front desk for pick up by Pt tomorrow as requested.

## 2014-02-10 ENCOUNTER — Ambulatory Visit (INDEPENDENT_AMBULATORY_CARE_PROVIDER_SITE_OTHER): Payer: Medicare Other

## 2014-02-10 DIAGNOSIS — Z23 Encounter for immunization: Secondary | ICD-10-CM

## 2014-03-22 ENCOUNTER — Other Ambulatory Visit: Payer: Self-pay | Admitting: Internal Medicine

## 2014-05-07 ENCOUNTER — Telehealth: Payer: Self-pay | Admitting: Internal Medicine

## 2014-05-07 MED ORDER — HYDROCODONE-ACETAMINOPHEN 5-325 MG PO TABS: 1.0000 | ORAL_TABLET | Freq: Four times a day (QID) | ORAL | Status: DC | PRN

## 2014-05-07 NOTE — Telephone Encounter (Signed)
Done, #300, no Rf (printed twice, the first rx was discard b/c was printed on regular paper)

## 2014-05-07 NOTE — Telephone Encounter (Signed)
MyChart message sent to Pt informing her Hydrocodone Rx is ready for pick up at front desk.

## 2014-05-07 NOTE — Telephone Encounter (Signed)
Pt is requesting refill on Hydrocodone.  Last OV: 08/08/2013 Last Fill: 02/06/2014 # 300 0RF UDS: 08/08/2013 Low risk  Please advise.

## 2014-07-02 ENCOUNTER — Other Ambulatory Visit: Payer: Self-pay

## 2014-07-07 ENCOUNTER — Ambulatory Visit: Payer: Medicare Other | Admitting: Internal Medicine

## 2014-07-20 ENCOUNTER — Other Ambulatory Visit: Payer: Self-pay

## 2014-07-20 ENCOUNTER — Encounter: Payer: Self-pay | Admitting: Internal Medicine

## 2014-07-20 MED ORDER — LEVOTHYROXINE SODIUM 88 MCG PO TABS: 88.0000 ug | ORAL_TABLET | Freq: Every day | ORAL | Status: DC

## 2014-08-04 ENCOUNTER — Other Ambulatory Visit: Payer: Self-pay | Admitting: Internal Medicine

## 2014-08-04 MED ORDER — HYDROCODONE-ACETAMINOPHEN 5-325 MG PO TABS: 1.0000 | ORAL_TABLET | Freq: Four times a day (QID) | ORAL | Status: DC | PRN

## 2014-08-04 NOTE — Telephone Encounter (Signed)
Rx placed at front desk, Pt informed via MyChart that Rx is ready for pick up at her convenience.

## 2014-08-04 NOTE — Telephone Encounter (Signed)
Rx printed, awaiting MD signature.  

## 2014-08-04 NOTE — Telephone Encounter (Signed)
Okay #300. UDS at time of pick up

## 2014-08-04 NOTE — Telephone Encounter (Signed)
Pt is requesting refill on Hydrocodone.  Last OV: 08/08/2013, next appt scheduled for 09/01/2014 at 0800 Last Fill: 05/07/2014 #300 0RF UDS: 08/08/2013 Low risk  DUE FOR UDS  Please advise.

## 2014-08-11 ENCOUNTER — Telehealth: Payer: Self-pay | Admitting: Internal Medicine

## 2014-08-11 NOTE — Telephone Encounter (Signed)
Pre visit letter sent  °

## 2014-08-20 ENCOUNTER — Encounter: Payer: Medicare Other | Admitting: Internal Medicine

## 2014-08-31 ENCOUNTER — Telehealth: Payer: Self-pay | Admitting: *Deleted

## 2014-08-31 ENCOUNTER — Encounter: Payer: Self-pay | Admitting: *Deleted

## 2014-08-31 NOTE — Telephone Encounter (Signed)
Pre-Visit Call completed with patient and chart updated.   Pre-Visit Info documented in Specialty Comments under [employer].    

## 2014-09-01 ENCOUNTER — Ambulatory Visit (INDEPENDENT_AMBULATORY_CARE_PROVIDER_SITE_OTHER): Payer: Medicare Other | Admitting: Internal Medicine

## 2014-09-01 ENCOUNTER — Encounter: Payer: Self-pay | Admitting: Internal Medicine

## 2014-09-01 VITALS — BP 122/68 | HR 61 | Temp 97.9°F | Ht 66.0 in | Wt 167.4 lb

## 2014-09-01 DIAGNOSIS — M810 Age-related osteoporosis without current pathological fracture: Secondary | ICD-10-CM

## 2014-09-01 DIAGNOSIS — Z0000 Encounter for general adult medical examination without abnormal findings: Secondary | ICD-10-CM | POA: Diagnosis not present

## 2014-09-01 DIAGNOSIS — E039 Hypothyroidism, unspecified: Secondary | ICD-10-CM

## 2014-09-01 DIAGNOSIS — M797 Fibromyalgia: Secondary | ICD-10-CM | POA: Diagnosis not present

## 2014-09-01 DIAGNOSIS — G4700 Insomnia, unspecified: Secondary | ICD-10-CM

## 2014-09-01 LAB — BASIC METABOLIC PANEL
BUN: 12 mg/dL (ref 6–23)
CALCIUM: 9.4 mg/dL (ref 8.4–10.5)
CO2: 28 meq/L (ref 19–32)
Chloride: 106 mEq/L (ref 96–112)
Creatinine, Ser: 0.76 mg/dL (ref 0.40–1.20)
GFR: 80.32 mL/min (ref >60.00)
GLUCOSE: 80 mg/dL (ref 70–99)
Potassium: 3.8 mEq/L (ref 3.5–5.1)
SODIUM: 141 meq/L (ref 135–145)

## 2014-09-01 LAB — TSH: TSH: 0.81 u[IU]/mL (ref 0.35–4.50)

## 2014-09-01 LAB — ALT: ALT: 17 U/L (ref 0–35)

## 2014-09-01 LAB — AST: AST: 15 U/L (ref 0–37)

## 2014-09-01 MED ORDER — ALPRAZOLAM 0.5 MG PO TABS: 0.5000 mg | ORAL_TABLET | Freq: Every evening | ORAL | Status: DC | PRN

## 2014-09-01 NOTE — Assessment & Plan Note (Addendum)
Encourage calcium, vitamin D. Follow-up by rheumatology Last Bone Density-- 02/07/13--- Osteoporosis

## 2014-09-01 NOTE — Progress Notes (Signed)
Subjective:    Patient ID: [name], female    DOB: Apr 20, 1945, 69 y.o.   MRN: [id number]  DOS:  09/01/2014 Type of visit - description :    Here for Medicare AWV: 1.         Risk factors based on Past M, S, F history: reviewed   2.         Physical Activities: takes walks w/ the dog, home chores, some yard work 3.         Depression/mood: (-) screening   4.         Hearing: s/p ENT, uses hearing aids     5.         ADL's: totally independent   6.         Fall Risk: no recent falls, normal balance , see instructions   7.         Home Safety: does feel safe at home   8.         Height, weight, &visual acuity: see VS,sees eye doctor q year, s/p 1 cataract extraction, has mild glaucoma   9.Counseling:yes   10. Labs ordered based on risk factors:  if needed   11.Referral Coordination, if needed   12. Care Plan, see a/p 13. Cognitive Assessment: motor skill, coordination and cognition appropiate  14. Care team updated 15. End of life care discussed, has a living will   In addition we discussed the following  Pain management, on hydrocodone, symptoms well-controlled Hypothyroidism, good compliance with Synthroid. Check labs Insomnia, Ambien won't be covered by her insurance, change medication?         Review of Systems Constitutional: No fever. No chills. No unexplained wt changes. No unusual sweats  HEENT: No dental problems, no ear discharge, no facial swelling, no voice changes. No eye discharge, no eye  redness , no  intolerance to light   Respiratory: No wheezing , no  difficulty breathing. No cough , no mucus production  Cardiovascular: No CP,  no  Palpitations. Occasional B ankle edema usually during plane trips or prolonged car trips  GI: no nausea, no vomiting, no diarrhea , no  abdominal pain.  No blood in the stools. No dysphagia, no odynophagia    Endocrine: No polyphagia, no polyuria , no polydipsia  GU: No dysuria, gross hematuria, difficulty  urinating. No urinary urgency,++ nocturia (chronic).  Musculoskeletal: No joint swellings or unusual aches or pains (chronic pain at baseline )  Skin: No change in the color of the skin, palor , no  Rash  Allergic, immunologic: No environmental allergies , no  food allergies  Neurological: No dizziness no  syncope. No headaches. No diplopia, no slurred, no slurred speech, no motor deficits, no facial  Numbness  Hematological: No enlarged lymph nodes, no easy bruising , no unusual bleedings  Psychiatry: No suicidal ideas, no hallucinations, no beavior problems, no confusion.  No unusual/severe anxiety, no depression    Past Medical History  Diagnosis Date  . Hypothyroidism   . Migraine   . Osteopenia   . Colonic polyp     bx adenomatous polyps, next scope 2010  . Pleuritic chest pain     etiology unclear, has beeb eval by pulmonary and rheumatology (autoimmune dz?)  . Depression   . Chest pain     stress test @ [facility] cardiology (-)  . Fibromyalgia     see rheumatology  . 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  Glaucoma     Past Surgical History  Procedure Laterality Date  . Tonsillectomy    . Right rotator cuff repair  2010  . 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  . Esophagogastroduodenoscopy (egd) with propofol N/A 03/27/2013    Procedure: ESOPHAGOGASTRODUODENOSCOPY (EGD) WITH PROPOFOL;  Surgeon: Milus Banister, MD;  Location: [facility];  Service: Endoscopy;  Laterality: N/A;  possible dil  . Plastic surgery face  2013    History   Social History  . Marital Status: Married    Spouse Name: N/A  . Number of Children: 0  . Years of Education: N/A   Occupational History  . retired, Medical sales representative work    Social History Main Topics  . Smoking status: Former Smoker    Types: Cigarettes    Quit date: 01/13/2004  . Smokeless tobacco: Never Used     Comment: quit 2005  . Alcohol Use: Yes     Comment: rare  . Drug Use: No  . Sexual Activity: Not on file    Other Topics Concern  . Not on file   Social History Narrative   No biological children              Medication List       This list is accurate as of: 09/01/14 11:59 PM.  Always use your most recent med list.               ALPRAZolam 0.5 MG tablet  Commonly known as:  XANAX  Take 1 tablet (0.5 mg total) by mouth at bedtime as needed for anxiety.     conjugated estrogens vaginal cream  Commonly known as:  PREMARIN  Place 1 Applicatorful vaginally 2 (two) times a week.     HYDROcodone-acetaminophen 5-325 MG per tablet  Commonly known as:  NORCO/VICODIN  Take 1 tablet by mouth every 6 (six) hours as needed for moderate pain.     levothyroxine 88 MCG tablet  Commonly known as:  SYNTHROID, LEVOTHROID  Take 1 tablet (88 mcg total) by mouth daily before breakfast.     multivitamin capsule  Take 1 capsule by mouth daily.     omeprazole 20 MG tablet  Commonly known as:  PRILOSEC OTC  Take 20 mg by mouth daily.     polycarbophil 625 MG tablet  Commonly known as:  FIBERCON  Take 625 mg by mouth daily.     RESTASIS 0.05 % ophthalmic emulsion  Generic drug:  cycloSPORINE  Place 1 drop into both eyes 2 (two) times daily.       Family History  Problem Relation Age of Onset  . Adopted: Yes  . Colon cancer Neg Hx        Objective:   Physical Exam BP 122/68 mmHg  Pulse 61  Temp(Src) 97.9 F (36.6 C) (Oral)  Ht 5\' 6"  (1.676 m)  Wt 167 lb 6 oz (75.921 kg)  BMI 27.03 kg/m2  SpO2 97%  General:   Well developed, well nourished . NAD.  HEENT:  Normocephalic . Face symmetric, atraumatic Lungs:  CTA B Normal respiratory effort, no intercostal retractions, no accessory muscle use. Heart: RRR,  no murmur.  no pretibial edema bilaterally  Abdomen:  Not distended, soft, non-tender. No rebound or rigidity. No mass,organomegaly. Skin: Not pale. Not jaundice Neurologic:  alert & oriented X3.  Speech normal, gait appropriate for age and unassisted Psych--   Cognition and judgment appear intact.  Cooperative with normal attention span and concentration.  Behavior appropriate. No anxious or depressed appearing.      Assessment & Plan:

## 2014-09-01 NOTE — Patient Instructions (Signed)
Get your blood work before you leave  You also need a UDS   Xanax 0.5 mg half or one tablet at bedtime as needed.   Come back to the office in 1 year for a physical exam  Please schedule an appointment at the front desk    Come back fasting       Fall Prevention and Home Safety Falls cause injuries and can affect all age groups. It is possible to use preventive measures to significantly decrease the likelihood of falls. There are many simple measures which can make your home safer and prevent falls. OUTDOORS  Repair cracks and edges of walkways and driveways.  Remove high doorway thresholds.  Trim shrubbery on the main path into your home.  Have good outside lighting.  Clear walkways of tools, rocks, debris, and clutter.  Check that handrails are not broken and are securely fastened. Both sides of steps should have handrails.  Have leaves, snow, and ice cleared regularly.  Use sand or salt on walkways during winter months.  In the garage, clean up grease or oil spills. BATHROOM  Install night lights.  Install grab bars by the toilet and in the tub and shower.  Use non-skid mats or decals in the tub or shower.  Place a plastic non-slip stool in the shower to sit on, if needed.  Keep floors dry and clean up all water on the floor immediately.  Remove soap buildup in the tub or shower on a regular basis.  Secure bath mats with non-slip, double-sided rug tape.  Remove throw rugs and tripping hazards from the floors. BEDROOMS  Install night lights.  Make sure a bedside light is easy to reach.  Do not use oversized bedding.  Keep a telephone by your bedside.  Have a firm chair with side arms to use for getting dressed.  Remove throw rugs and tripping hazards from the floor. KITCHEN  Keep handles on pots and pans turned toward the center of the stove. Use back burners when possible.  Clean up spills quickly and allow time for drying.  Avoid walking on wet  floors.  Avoid hot utensils and knives.  Position shelves so they are not too high or low.  Place commonly used objects within easy reach.  If necessary, use a sturdy step stool with a grab bar when reaching.  Keep electrical cables out of the way.  Do not use floor polish or wax that makes floors slippery. If you must use wax, use non-skid floor wax.  Remove throw rugs and tripping hazards from the floor. STAIRWAYS  Never leave objects on stairs.  Place handrails on both sides of stairways and use them. Fix any loose handrails. Make sure handrails on both sides of the stairways are as long as the stairs.  Check carpeting to make sure it is firmly attached along stairs. Make repairs to worn or loose carpet promptly.  Avoid placing throw rugs at the top or bottom of stairways, or properly secure the rug with carpet tape to prevent slippage. Get rid of throw rugs, if possible.  Have an electrician put in a light switch at the top and bottom of the stairs. OTHER FALL PREVENTION TIPS  Wear low-heel or rubber-soled shoes that are supportive and fit well. Wear closed toe shoes.  When using a stepladder, make sure it is fully opened and both spreaders are firmly locked. Do not climb a closed stepladder.  Add color or contrast paint or tape to grab bars  and handrails in your home. Place contrasting color strips on first and last steps.  Learn and use mobility aids as needed. Install an electrical emergency response system.  Turn on lights to avoid dark areas. Replace light bulbs that burn out immediately. Get light switches that glow.  Arrange furniture to create clear pathways. Keep furniture in the same place.  Firmly attach carpet with non-skid or double-sided tape.  Eliminate uneven floor surfaces.  Select a carpet pattern that does not visually hide the edge of steps.  Be aware of all pets. OTHER HOME SAFETY TIPS  Set the water temperature for 120 F (48.8 C).  Keep  emergency numbers on or near the telephone.  Keep smoke detectors on every level of the home and near sleeping areas. Document Released: 03/17/2002 Document Revised: 09/26/2011 Document Reviewed: 06/16/2011 [facility] Patient Information 2015 [place], [place]. This information is not intended to replace advice given to you by your health care provider. Make sure you discuss any questions you have with your health care provider.   Preventive Care for Adults Ages 78 and over  Blood pressure check.** / Every 1 to 2 years.  Lipid and cholesterol check.**/ Every 5 years beginning at age 87.  Lung cancer screening. / Every year if you are aged 52-80 years and have a 30-pack-year history of smoking and currently smoke or have quit within the past 15 years. Yearly screening is stopped once you have quit smoking for at least 15 years or develop a health problem that would prevent you from having lung cancer treatment.  Fecal occult blood test (FOBT) of stool. / Every year beginning at age 52 and continuing until age 72. You may not have to do this test if you get a colonoscopy every 10 years.  Flexible sigmoidoscopy** or colonoscopy.** / Every 5 years for a flexible sigmoidoscopy or every 10 years for a colonoscopy beginning at age 35 and continuing until age 70.  Hepatitis C blood test.** / For all people born from [birth year] through 1965 and any individual with known risks for hepatitis C.  Abdominal aortic aneurysm (AAA) screening.** / A one-time screening for ages 16 to 9 years who are current or former smokers.  Skin self-exam. / Monthly.  Influenza vaccine. / Every year.  Tetanus, diphtheria, and acellular pertussis (Tdap/Td) vaccine.** / 1 dose of Td every 10 years.  Varicella vaccine.** / Consult your health care provider.  Zoster vaccine.** / 1 dose for adults aged 45 years or older.  Pneumococcal 13-valent conjugate (PCV13) vaccine.** / Consult your health care provider.  Pneumococcal  polysaccharide (PPSV23) vaccine.** / 1 dose for all adults aged 52 years and older.  Meningococcal vaccine.** / Consult your health care provider.  Hepatitis A vaccine.** / Consult your health care provider.  Hepatitis B vaccine.** / Consult your health care provider.  Haemophilus influenzae type b (Hib) vaccine.** / Consult your health care provider. **Family history and personal history of risk and conditions may change your health care provider's recommendations. Document Released: 05/23/2001 Document Revised: 04/01/2013 Document Reviewed: 08/22/2010 [facility] Patient Information 2015 [place], [place]. This information is not intended to replace advice given to you by your health care provider. Make sure you discuss any questions you have with your health care provider.

## 2014-09-01 NOTE — Assessment & Plan Note (Signed)
Due for labs

## 2014-09-01 NOTE — Assessment & Plan Note (Addendum)
TD 2007 Pneumonia shot 2006 and 2013;  prevnar 2015 Had a shingles immunization   Cscope 11-2005:  polyps-adenomatous, repeated  colonoscopy 11/2008 and 04-2012, next in 5 years.  Female care  Pap-- 01/08/12- with Dr. Ubaldo Glassing- negative  MMG-- 01/02/14 with Conchita Paris, MD- BI-RADS CAT 1: Neg   Palpable aorta-- US neg for AAA 07-2012 diet and exercise discussed Labs   Former smoker, interested on lung cancer screening , referral will be entry

## 2014-09-01 NOTE — Progress Notes (Signed)
Pre visit review using our clinic review tool, if applicable. No additional management support is needed unless otherwise documented below in the visit note. 

## 2014-09-01 NOTE — Assessment & Plan Note (Signed)
Was taking Ambien CR with relatively good results but her insurance will not cover it. Most nights Tylenol PM works at occasionally needs something else. Plan: Xanax as needed

## 2014-09-01 NOTE — Assessment & Plan Note (Signed)
Symptoms relatively well controlled, check a CMP given chronic pain medicines, get a UDS, refill meds as needed

## 2014-09-02 ENCOUNTER — Encounter: Payer: Self-pay | Admitting: Internal Medicine

## 2014-09-02 NOTE — Addendum Note (Signed)
Addended by: Kathlene November E on: 09/02/2014 05:13 PM   Modules accepted: Orders, SmartSet

## 2014-09-03 ENCOUNTER — Other Ambulatory Visit: Payer: Self-pay | Admitting: Acute Care

## 2014-09-03 ENCOUNTER — Encounter: Payer: Self-pay | Admitting: Acute Care

## 2014-09-03 ENCOUNTER — Telehealth: Payer: Self-pay | Admitting: Acute Care

## 2014-09-03 DIAGNOSIS — Z87891 Personal history of nicotine dependence: Secondary | ICD-10-CM

## 2014-09-03 NOTE — Telephone Encounter (Signed)
I called [name] upon referral by Dr. Larose Kells for Lung Cancer Screening visit. I have scheduled her for an appointment 09/10/14 @10am . She verbally confirmed both time and location of the appointment. I will schedule her for LDCT at [place] per her request for 09/11/14 early am.

## 2014-09-10 ENCOUNTER — Encounter: Payer: Self-pay | Admitting: Acute Care

## 2014-09-10 ENCOUNTER — Ambulatory Visit (INDEPENDENT_AMBULATORY_CARE_PROVIDER_SITE_OTHER): Payer: Medicare Other | Admitting: Acute Care

## 2014-09-10 DIAGNOSIS — Z87891 Personal history of nicotine dependence: Secondary | ICD-10-CM | POA: Diagnosis not present

## 2014-09-10 NOTE — Progress Notes (Signed)
Shared Decision Making Visit Lung Cancer Screening Program [phone])   Eligibility:  Age 69 y.o.  Pack Years Smoking History Calculation : 38.5 pack years (# packs/per year x # years smoked)  Recent History of coughing up blood : no  Unexplained weight loss? no ( >Than 15 pounds within the last 6 months )  Prior History Lung / other cancer no (Diagnosis within the last 5 years already requiring surveillance chest CT Scans).  Smoking Status Former Smoker  Former Smokers: Years since quit: 10.5 years  Quit Date: 01/13/2004  Visit Components:  Discussion included one or more decision making aids. yes  Discussion included risk/benefits of screening. yes  Discussion included potential follow up diagnostic testing for abnormal scans. yes  Discussion included meaning and risk of over diagnosis. yes  Discussion included meaning and risk of False Positives. yes  Discussion included meaning of total radiation exposure. yes  Counseling Included:  Importance of adherence to annual lung cancer LDCT screening. yes  Impact of comorbidities on ability to participate in the program. yes  Ability and willingness to under diagnostic treatment. yes  Smoking Cessation Counseling:  Current Smokers:   Discussed importance of smoking cessation. yes  Information about tobacco cessation classes and interventions provided to patient.N/A, Former smoker  Patient provided with "ticket" for LDCT Scan. yes  Symptomatic Patient. no  Counseling: N/A  Diagnosis Code: Tobacco Use Z72.0  Asymptomatic Patient yes  Counseling:N/A  Former Smokers:   Discussed the importance of maintaining cigarette abstinence. yes  Diagnosis Code: Personal History of Nicotine Dependence. G40.102  Information about tobacco cessation classes and interventions provided to patient. N/A  Patient provided with "ticket" for LDCT Scan. yes  Written Order for Lung Cancer Screening with LDCT placed in Epic.  Yes (CT Chest Lung Cancer Screening Low Dose W/O CM) [id number] Z12.2-Screening of respiratory organs Z87.891-Personal history of nicotine dependence  I spent 15 minutes of face to face time explaining the risks and benefits of lung cancer screening by low dose CT. We viewed a power point and reviewed all of the above noted issues, stopping at intervals to allow for question and answer dialogue. [name] verbalized understanding of the program. She understands where to go for the scan, which is scheduled for 09/11/14 at [facility]. She verbalized time and location of the appointment. She has her ticket to ride the scanner and verbalized understanding of the need to take it with her to the scanner. She is a former smoker, and plans to never smoke again.  Magdalen Spatz, NP

## 2014-09-11 ENCOUNTER — Telehealth: Payer: Self-pay | Admitting: Acute Care

## 2014-09-11 ENCOUNTER — Ambulatory Visit (HOSPITAL_BASED_OUTPATIENT_CLINIC_OR_DEPARTMENT_OTHER)
Admission: RE | Admit: 2014-09-11 | Discharge: 2014-09-11 | Disposition: A | Discharge status: Home / Self Care | Payer: Medicare Other | Source: Ambulatory Visit | Attending: Acute Care | Admitting: Acute Care

## 2014-09-11 DIAGNOSIS — Z87891 Personal history of nicotine dependence: Secondary | ICD-10-CM | POA: Diagnosis not present

## 2014-09-11 NOTE — Telephone Encounter (Signed)
I spoke with [name] about the results of her scan. Lung RADS 2, not suspicious or concerning for lung cancer. Follow up scanning recommended for 12 months. She verbalized understanding. I told her  I will call to schedule her follow up scan  in about 11 months. She has my contact info for questions if she needs to get in touch with me.I will message Dr. Larose Kells and make sure he knows to look at the scans in EPIC.

## 2014-09-17 ENCOUNTER — Telehealth: Payer: Self-pay

## 2014-09-17 NOTE — Telephone Encounter (Signed)
UDS: 09/01/2014  Negative for Alprazolam: PRN Positive for Norco   Low risk per Dr. Larose Kells 09/17/2014

## 2014-10-14 ENCOUNTER — Ambulatory Visit (INDEPENDENT_AMBULATORY_CARE_PROVIDER_SITE_OTHER): Payer: Medicare Other | Admitting: Internal Medicine

## 2014-10-14 ENCOUNTER — Encounter: Payer: Self-pay | Admitting: Internal Medicine

## 2014-10-14 VITALS — BP 120/68 | HR 71 | Temp 97.6°F | Ht 66.0 in | Wt 168.1 lb

## 2014-10-14 DIAGNOSIS — J0100 Acute maxillary sinusitis, unspecified: Secondary | ICD-10-CM

## 2014-10-14 MED ORDER — PREDNISONE 10 MG PO TABS: ORAL_TABLET | ORAL | Status: DC

## 2014-10-14 MED ORDER — AZITHROMYCIN 250 MG PO TABS: ORAL_TABLET | ORAL | Status: DC

## 2014-10-14 NOTE — Patient Instructions (Signed)
Rest, fluids , tylenol If  cough, take Mucinex DM twice a day as needed  OTC Nasocort or Flonase : 2 nasal sprays on each side of the nose daily until you feel better Prednisone as prescribed Take the antibiotic as prescribed ---> Zithromax Call if not gradually better over the next  10 days Call anytime if the symptoms are severe

## 2014-10-14 NOTE — Progress Notes (Signed)
Subjective:    Patient ID: [name], female    DOB: 09/27/1945, 69 y.o.   MRN: [id number]  DOS:  10/14/2014 Type of visit - description : Acute Interval history: 4-5 weeks history of fullness at the frontal and maxillary sinuses area, postnasal dripping, pooling of mucus in the throat, some sore throat. Taking allergy medications did not help.  Review of Systems  Denies fever chills, + sweats at night, minimal cough with no sputum production or wheezing. Occasional has some sneezing but no itchy eyes or itchy nose.  Past Medical History  Diagnosis Date  . Hypothyroidism   . Migraine   . Osteopenia   . Colonic polyp     bx adenomatous polyps, next scope 2010  . Pleuritic chest pain     etiology unclear, has beeb eval by pulmonary and rheumatology (autoimmune dz?)  . Depression   . Chest pain     stress test @ [facility] cardiology (-)  . Fibromyalgia     see rheumatology  . 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 Glaucoma     Past Surgical History  Procedure Laterality Date  . Tonsillectomy    . Right rotator cuff repair  2010  . 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  . Esophagogastroduodenoscopy (egd) with propofol N/A 03/27/2013    Procedure: ESOPHAGOGASTRODUODENOSCOPY (EGD) WITH PROPOFOL;  Surgeon: Milus Banister, MD;  Location: [facility] ENDOS[facility];  Service: Endoscopy;  Laterality: N/A;  possible dil  . Plastic surgery face  2013    History   Social History  . Marital Status: Married    Spouse Name: N/A  . Number of Children: 0  . Years of Education: N/A   Occupational History  . retired, Medical sales representative work    Social History Main Topics  . Smoking status: Former Smoker -- 1.00 packs/day for 38.5 years    Types: Cigarettes    Quit date: 01/13/2004  . Smokeless tobacco: Never Used     Comment: quit 2005  . Alcohol Use: 0.0 oz/week    0 Standard drinks or equivalent per week     Comment: rare  . Drug Use: No  . Sexual Activity: Not on file   Other Topics  Concern  . Not on file   Social History Narrative   No biological children              Medication List       This list is accurate as of: 10/14/14 11:59 PM.  Always use your most recent med list.               ALPRAZolam 0.5 MG tablet  Commonly known as:  XANAX  Take 1 tablet (0.5 mg total) by mouth at bedtime as needed for anxiety.     azithromycin 250 MG tablet  Commonly known as:  ZITHROMAX Z-PAK  2 tabs a day the first day, then 1 tab a day x 4 days     conjugated estrogens vaginal cream  Commonly known as:  PREMARIN  Place 1 Applicatorful vaginally 2 (two) times a week.     HYDROcodone-acetaminophen 5-325 MG per tablet  Commonly known as:  NORCO/VICODIN  Take 1 tablet by mouth every 6 (six) hours as needed for moderate pain.     levothyroxine 88 MCG tablet  Commonly known as:  SYNTHROID, LEVOTHROID  Take 1 tablet (88 mcg total) by mouth daily before breakfast.     multivitamin capsule  Take 1 capsule by mouth daily.     omeprazole 20 MG tablet  Commonly known as:  PRILOSEC OTC  Take 20 mg by mouth daily.     polycarbophil 625 MG tablet  Commonly known as:  FIBERCON  Take 625 mg by mouth daily.     predniSONE 10 MG tablet  Commonly known as:  DELTASONE  4 tablets x 2 days, 3 tabs x 2 days, 2 tabs x 2 days, 1 tab x 2 days     RESTASIS 0.05 % ophthalmic emulsion  Generic drug:  cycloSPORINE  Place 1 drop into both eyes 2 (two) times daily.           Objective:   Physical Exam BP 120/68 mmHg  Pulse 71  Temp(Src) 97.6 F (36.4 C) (Oral)  Ht 5\' 6"  (1.676 m)  Wt 168 lb 2 oz (76.261 kg)  BMI 27.15 kg/m2  SpO2 98% General:   Well developed, well nourished . NAD.  HEENT:  Normocephalic . Face symmetric, atraumatic TMs normal, nose congested, right maxillary sinuses is slightly TTP. Lungs:  CTA B Normal respiratory effort, no intercostal retractions, no accessory muscle use. Heart: RRR,  no murmur.  No pretibial edema bilaterally  Skin: Not  pale. Not jaundice Neurologic:  alert & oriented X3.  Speech normal, gait appropriate for age and unassisted Psych--  Cognition and judgment appear intact.  Cooperative with normal attention span and concentration.  Behavior appropriate. No anxious or depressed appearing.       Assessment & Plan:    Sinusitis 4 weeks history of sinus congestion, no response to OTC antihistaminic, slightly TTP at the right maxillary sinuses, most likely has bacterial sinusitis. Plan: see instructions

## 2014-10-14 NOTE — Progress Notes (Signed)
Pre visit review using our clinic review tool, if applicable. No additional management support is needed unless otherwise documented below in the visit note. 

## 2014-11-03 ENCOUNTER — Telehealth: Payer: Self-pay | Admitting: Internal Medicine

## 2014-11-04 MED ORDER — HYDROCODONE-ACETAMINOPHEN 5-325 MG PO TABS
1.0000 | ORAL_TABLET | Freq: Four times a day (QID) | ORAL | Status: DC | PRN
Start: 2014-11-04 — End: 2014-11-27

## 2014-11-04 NOTE — Telephone Encounter (Signed)
Pt is requesting refill on Hydrocodone.  Last OV: 10/14/2014  Last Fill: 08/04/2014 #300 (?!) 0RF UDS: 09/01/2014 Low risk   Please advise.

## 2014-11-04 NOTE — Telephone Encounter (Signed)
Pt informed via MyChart that Rx has been placed at front desk for pick up at Pt's convenience. Informed Pt that Dr. Larose Kells is out of the office for the week and will only be given a 30 day supply at this time.

## 2014-11-04 NOTE — Telephone Encounter (Signed)
I can only fill for 1 month.  See rx.

## 2014-11-26 ENCOUNTER — Other Ambulatory Visit: Payer: Self-pay

## 2014-11-27 ENCOUNTER — Encounter: Payer: Self-pay | Admitting: Internal Medicine

## 2014-11-27 ENCOUNTER — Ambulatory Visit (INDEPENDENT_AMBULATORY_CARE_PROVIDER_SITE_OTHER): Payer: Medicare Other | Admitting: Internal Medicine

## 2014-11-27 VITALS — BP 122/84 | HR 76 | Temp 97.5°F | Ht 66.0 in | Wt 169.5 lb

## 2014-11-27 DIAGNOSIS — J320 Chronic maxillary sinusitis: Secondary | ICD-10-CM | POA: Diagnosis not present

## 2014-11-27 DIAGNOSIS — J329 Chronic sinusitis, unspecified: Secondary | ICD-10-CM

## 2014-11-27 MED ORDER — AZELASTINE HCL 0.1 % NA SOLN: 2.0000 | Freq: Every evening | NASAL | Status: DC | PRN

## 2014-11-27 MED ORDER — HYDROCODONE-ACETAMINOPHEN 5-325 MG PO TABS: 1.0000 | ORAL_TABLET | Freq: Four times a day (QID) | ORAL | Status: DC | PRN

## 2014-11-27 NOTE — Progress Notes (Signed)
Pre visit review using our clinic review tool, if applicable. No additional management support is needed unless otherwise documented below in the visit note. 

## 2014-11-27 NOTE — Patient Instructions (Signed)
We'll schedule a CAT scan  Use Flonase 2 sprays in each side of the nose every morning long-term  Use Astelin 2 sprays in each side of the nose every night long-term  Claritin 10 mg one tablet at bedtime for one month  Call if you are not improving

## 2014-11-27 NOTE — Progress Notes (Signed)
Subjective:    Patient ID: [name], female    DOB: Jun 15, 1945, 69 y.o.   MRN: [id number]  DOS:  11/27/2014 Type of visit - description : Acute visit, here with her husband Interval history: Was seen with right maxillary sinusitis, treated with a Z-Pak and prednisone, she improved temporarily but symptoms are gradually coming back. Sx definitely worse for the last 5 days: Right ear ache, right sinus and jaw pain. Had a submandibular gland on the right that is a slightly tender and increases in size on and off.  Review of Systems  Denies fever chills + Postnasal dripping, unable to blow any discharge from the nose. No actual cough but she has to clear her throat frequently. No chest congestion or sputum production   Past Medical History  Diagnosis Date  . Hypothyroidism   . Migraine   . Osteopenia   . Colonic polyp     bx adenomatous polyps, next scope 2010  . Pleuritic chest pain     etiology unclear, has beeb eval by pulmonary and rheumatology (autoimmune dz?)  . Depression   . Chest pain     stress test @ [facility] cardiology (-)  . Fibromyalgia     see rheumatology  . 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 Glaucoma     Past Surgical History  Procedure Laterality Date  . Tonsillectomy    . Right rotator cuff repair  2010  . 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  . Esophagogastroduodenoscopy (egd) with propofol N/A 03/27/2013    Procedure: ESOPHAGOGASTRODUODENOSCOPY (EGD) WITH PROPOFOL;  Surgeon: Milus Banister, MD;  Location: [facility];  Service: Endoscopy;  Laterality: N/A;  possible dil  . Plastic surgery face  2013    Social History   Social History  . Marital Status: Married    Spouse Name: N/A  . Number of Children: 0  . Years of Education: N/A   Occupational History  . retired, Medical sales representative work    Social History Main Topics  . Smoking status: Former Smoker -- 1.00 packs/day for 38.5 years    Types: Cigarettes    Quit date: 01/13/2004  .  Smokeless tobacco: Never Used     Comment: quit 2005  . Alcohol Use: 0.0 oz/week    0 Standard drinks or equivalent per week     Comment: rare  . Drug Use: No  . Sexual Activity: Not on file   Other Topics Concern  . Not on file   Social History Narrative   No biological children              Medication List       This list is accurate as of: 11/27/14 11:59 PM.  Always use your most recent med list.               ALPRAZolam 0.5 MG tablet  Commonly known as:  XANAX  Take 1 tablet (0.5 mg total) by mouth at bedtime as needed for anxiety.     azelastine 0.1 % nasal spray  Commonly known as:  ASTELIN  Place 2 sprays into both nostrils at bedtime as needed for rhinitis. Use in each nostril as directed     conjugated estrogens vaginal cream  Commonly known as:  PREMARIN  Place 1 Applicatorful vaginally 2 (two) times a week.     HYDROcodone-acetaminophen 5-325 MG per tablet  Commonly known as:  NORCO/VICODIN  Take 1 tablet by mouth every  6 (six) hours as needed for moderate pain.     levothyroxine 88 MCG tablet  Commonly known as:  SYNTHROID, LEVOTHROID  Take 1 tablet (88 mcg total) by mouth daily before breakfast.     multivitamin capsule  Take 1 capsule by mouth daily.     omeprazole 20 MG tablet  Commonly known as:  PRILOSEC OTC  Take 20 mg by mouth daily.     polycarbophil 625 MG tablet  Commonly known as:  FIBERCON  Take 625 mg by mouth daily.     RESTASIS 0.05 % ophthalmic emulsion  Generic drug:  cycloSPORINE  Place 1 drop into both eyes 2 (two) times daily.           Objective:   Physical Exam BP 122/84 mmHg  Pulse 76  Temp(Src) 97.5 F (36.4 C) (Oral)  Ht 5\' 6"  (1.676 m)  Wt 169 lb 8 oz (76.885 kg)  BMI 27.37 kg/m2  SpO2 97%  General:   Well developed, well nourished . NAD.  HEENT:  Normocephalic . Face symmetric, atraumatic. No dominant lymph nodes or a mass at the neck No facial rash. TMs normal, nose is slightly congested, right  maxillary area slightly TTP. Throat symmetric. No redness or discharge. Lungs:  CTA B Normal respiratory effort, no intercostal retractions, no accessory muscle use. Heart: RRR,  no murmur.  No pretibial edema bilaterally  Skin: Not pale. Not jaundice Neurologic:  alert & oriented X3.  Speech normal, gait appropriate for age and unassisted Psych--  Cognition and judgment appear intact.  Cooperative with normal attention span and concentration.  Behavior appropriate. No anxious or depressed appearing.      Assessment & Plan:

## 2014-11-29 NOTE — Assessment & Plan Note (Signed)
Was recently treated for acute sinusitis,  continue with right sinus symptoms, improved temporarily after a Z-Pak and prednisone. Symptoms may not be due to infection but rather allergies. Plan: Treat allergies aggressively with Flonase, Astelin and Claritin. Check a CT of the sinuses, if there is evidence of infection will need a different treatment. Consider ENT referral,  patient to call if not improving

## 2014-12-01 ENCOUNTER — Ambulatory Visit (HOSPITAL_BASED_OUTPATIENT_CLINIC_OR_DEPARTMENT_OTHER)
Admission: RE | Admit: 2014-12-01 | Discharge: 2014-12-01 | Disposition: A | Discharge status: Home / Self Care | Payer: Medicare Other | Source: Ambulatory Visit | Attending: Internal Medicine | Admitting: Internal Medicine

## 2014-12-01 DIAGNOSIS — J320 Chronic maxillary sinusitis: Secondary | ICD-10-CM | POA: Diagnosis not present

## 2014-12-01 DIAGNOSIS — J328 Other chronic sinusitis: Secondary | ICD-10-CM | POA: Diagnosis present

## 2014-12-10 ENCOUNTER — Other Ambulatory Visit: Payer: Self-pay | Admitting: Obstetrics & Gynecology

## 2014-12-10 ENCOUNTER — Other Ambulatory Visit (HOSPITAL_BASED_OUTPATIENT_CLINIC_OR_DEPARTMENT_OTHER): Payer: Self-pay | Admitting: Gynecology

## 2014-12-10 DIAGNOSIS — Z1231 Encounter for screening mammogram for malignant neoplasm of breast: Secondary | ICD-10-CM

## 2015-01-05 ENCOUNTER — Ambulatory Visit (HOSPITAL_BASED_OUTPATIENT_CLINIC_OR_DEPARTMENT_OTHER): Payer: Medicare Other

## 2015-01-05 ENCOUNTER — Ambulatory Visit (HOSPITAL_BASED_OUTPATIENT_CLINIC_OR_DEPARTMENT_OTHER)
Admission: RE | Admit: 2015-01-05 | Discharge: 2015-01-05 | Disposition: A | Discharge status: Home / Self Care | Payer: Medicare Other | Source: Ambulatory Visit | Attending: Obstetrics & Gynecology | Admitting: Obstetrics & Gynecology

## 2015-01-05 ENCOUNTER — Other Ambulatory Visit: Payer: Self-pay | Admitting: Obstetrics & Gynecology

## 2015-01-05 DIAGNOSIS — Z1231 Encounter for screening mammogram for malignant neoplasm of breast: Secondary | ICD-10-CM

## 2015-01-07 ENCOUNTER — Ambulatory Visit (INDEPENDENT_AMBULATORY_CARE_PROVIDER_SITE_OTHER): Payer: Medicare Other

## 2015-01-07 ENCOUNTER — Encounter: Payer: Self-pay | Admitting: Obstetrics & Gynecology

## 2015-01-07 ENCOUNTER — Ambulatory Visit (INDEPENDENT_AMBULATORY_CARE_PROVIDER_SITE_OTHER): Payer: Medicare Other | Admitting: Obstetrics & Gynecology

## 2015-01-07 ENCOUNTER — Other Ambulatory Visit: Payer: Self-pay | Admitting: Obstetrics & Gynecology

## 2015-01-07 VITALS — BP 125/69 | HR 75 | Wt 168.0 lb

## 2015-01-07 DIAGNOSIS — Z01419 Encounter for gynecological examination (general) (routine) without abnormal findings: Secondary | ICD-10-CM | POA: Diagnosis not present

## 2015-01-07 DIAGNOSIS — Z23 Encounter for immunization: Secondary | ICD-10-CM | POA: Diagnosis not present

## 2015-01-07 DIAGNOSIS — N952 Postmenopausal atrophic vaginitis: Secondary | ICD-10-CM | POA: Diagnosis not present

## 2015-01-07 DIAGNOSIS — M810 Age-related osteoporosis without current pathological fracture: Secondary | ICD-10-CM

## 2015-01-07 DIAGNOSIS — Z1239 Encounter for other screening for malignant neoplasm of breast: Secondary | ICD-10-CM | POA: Diagnosis not present

## 2015-01-07 MED ORDER — ESTROGENS, CONJUGATED 0.625 MG/GM VA CREA: 1.0000 | TOPICAL_CREAM | VAGINAL | Status: DC

## 2015-01-07 NOTE — Patient Instructions (Signed)
Menopause Menopause is the normal time of life when menstrual periods stop completely. Menopause is complete when you have missed 12 consecutive menstrual periods. It usually occurs between the ages of 48 years and 55 years. Very rarely does a woman develop menopause before the age of 40 years. At menopause, your ovaries stop producing the female hormones estrogen and progesterone. This can cause undesirable symptoms and also affect your health. Sometimes the symptoms may occur 4-5 years before the menopause begins. There is no relationship between menopause and:  Oral contraceptives.  Number of children you had.  Race.  The age your menstrual periods started (menarche). Heavy smokers and very thin women may develop menopause earlier in life. CAUSES  The ovaries stop producing the female hormones estrogen and progesterone.  Other causes include:  Surgery to remove both ovaries.  The ovaries stop functioning for no known reason.  Tumors of the pituitary gland in the brain.  Medical disease that affects the ovaries and hormone production.  Radiation treatment to the abdomen or pelvis.  Chemotherapy that affects the ovaries. SYMPTOMS   Hot flashes.  Night sweats.  Decrease in sex drive.  Vaginal dryness and thinning of the vagina causing painful intercourse.  Dryness of the skin and developing wrinkles.  Headaches.  Tiredness.  Irritability.  Memory problems.  Weight gain.  Bladder infections.  Hair growth of the face and chest.  Infertility. More serious symptoms include:  Loss of bone (osteoporosis) causing breaks (fractures).  Depression.  Hardening and narrowing of the arteries (atherosclerosis) causing heart attacks and strokes. DIAGNOSIS   When the menstrual periods have stopped for 12 straight months.  Physical exam.  Hormone studies of the blood. TREATMENT  There are many treatment choices and nearly as many questions about them. The  decisions to treat or not to treat menopausal changes is an individual choice made with your health care provider. Your health care provider can discuss the treatments with you. Together, you can decide which treatment will work best for you. Your treatment choices may include:   Hormone therapy (estrogen and progesterone).  Non-hormonal medicines.  Treating the individual symptoms with medicine (for example antidepressants for depression).  Herbal medicines that may help specific symptoms.  Counseling by a psychiatrist or psychologist.  Group therapy.  Lifestyle changes including:  Eating healthy.  Regular exercise.  Limiting caffeine and alcohol.  Stress management and meditation.  No treatment. HOME CARE INSTRUCTIONS   Take the medicine your health care provider gives you as directed.  Get plenty of sleep and rest.  Exercise regularly.  Eat a diet that contains calcium (good for the bones) and soy products (acts like estrogen hormone).  Avoid alcoholic beverages.  Do not smoke.  If you have hot flashes, dress in layers.  Take supplements, calcium, and vitamin D to strengthen bones.  You can use over-the-counter lubricants or moisturizers for vaginal dryness.  Group therapy is sometimes very helpful.  Acupuncture may be helpful in some cases. SEEK MEDICAL CARE IF:   You are not sure you are in menopause.  You are having menopausal symptoms and need advice and treatment.  You are still having menstrual periods after age 55 years.  You have pain with intercourse.  Menopause is complete (no menstrual period for 12 months) and you develop vaginal bleeding.  You need a referral to a specialist (gynecologist, psychiatrist, or psychologist) for treatment. SEEK IMMEDIATE MEDICAL CARE IF:   You have severe depression.  You have excessive vaginal bleeding.    You fell and think you have a broken bone.  You have pain when you urinate.  You develop leg or  chest pain.  You have a fast pounding heart beat (palpitations).  You have severe headaches.  You develop vision problems.  You feel a lump in your breast.  You have abdominal pain or severe indigestion. Document Released: 06/17/2003 Document Revised: 11/27/2012 Document Reviewed: 10/24/2012 Exit[employer] Patient Information 2015 [employer]. This information is not intended to replace advice given to you by your health care provider. Make sure you discuss any questions you have with your health care provider.  

## 2015-01-07 NOTE — Progress Notes (Signed)
Patient ID: [name], female   DOB: Mar 08, 1946, 69 y.o.   MRN: [id number] Subjective:     [name] is a 69 y.o. female here for a routine exam.  Current complaints: none.  She was prev on Fosomax for osteoporosis but, has not been treated for ~2 years.  She is not currenlty sexually active due to vaginal stenosis but, is not interested in tx as her husband is on statins.  Personal health questionnaire reviewed: yes.   Gynecologic History No LMP recorded. Patient is postmenopausal. Contraception: post menopausal status Last Pap: 12/2011. Results were: normal Last mammogram: 01/05/2015 (yesterday). Results were: pending  Obstetric History OB History  Gravida Para Term Preterm AB SAB TAB Ectopic Multiple Living  0 0 0 0 0 0 0 0 0 0         The following portions of the patient's history were reviewed and updated as appropriate: allergies, current medications, past family history, past medical history, past social history, past surgical history and problem list.  Review of Systems Pertinent items are noted in HPI.    Objective:  BP 125/69 mmHg  Pulse 75  Wt 168 lb (76.204 kg)  General Appearance:    Alert, cooperative, no distress, appears stated age  Head:    Normocephalic, without obvious abnormality, atraumatic  Eyes:    conjunctiva/corneas clear, EOM's intact, both eyes  Ears:    Normal external ear canals, both ears  Nose:   Nares normal, septum midline, mucosa normal, no drainage    or sinus tenderness  Throat:   Lips, mucosa, and tongue normal; teeth and gums normal  Neck:   Supple, symmetrical, trachea midline, no adenopathy;    thyroid:  no enlargement/tenderness/nodules  Back:     Symmetric, no curvature, ROM normal, no CVA tenderness  Lungs:     Clear to auscultation bilaterally, respirations unlabored  Chest Wall:    No tenderness or deformity   Heart:    Regular rate and rhythm, S1 and S2 normal, no murmur, rub   or gallop  Breast Exam:    No tenderness,  masses, or nipple abnormality  Abdomen:     Soft, non-tender, bowel sounds active all four quadrants,    no masses, no organomegaly  Genitalia:    Normal female without lesion, discharge or tenderness     Extremities:   Extremities normal, atraumatic, no cyanosis or edema  Pulses:   2+ and symmetric all extremities  Skin:   Skin color, texture, turgor normal, no rashes or lesions      Assessment:    Healthy female exam.   Osteoporosis not on treatment. Prev on bisphosphnates.  They were stopped 2 years prev Screening for cervical cancer- reviewed new guidelines with pt.  She had a PAP 2 years prev. No  H/o abnormal PAP. Low risk     Plan:    Follow up in: 1 year.    BMD scan- needs treatment if cont'd disease PAP in 1 year Add Ca++ and Vit D  Joseph Johns L. Harraway-Smith, M.D., Cherlynn June

## 2015-01-08 ENCOUNTER — Telehealth: Payer: Self-pay

## 2015-01-08 ENCOUNTER — Other Ambulatory Visit (HOSPITAL_BASED_OUTPATIENT_CLINIC_OR_DEPARTMENT_OTHER): Payer: Medicare Other

## 2015-01-08 NOTE — Telephone Encounter (Signed)
Left message for patient to return call to office.  Jennifer Howard RNBSN 

## 2015-01-08 NOTE — Telephone Encounter (Signed)
-----   Message from Lavonia Drafts, MD sent at 01/08/2015 10:09 AM EDT ----- Please call pt.  Please ask pt to begin Ca++ 1200mg  and Vit D at least 800 daily.  I don't see where she is on either of those on review of her chart.  Need to add to med list.  Thx, clh-S

## 2015-01-11 NOTE — Telephone Encounter (Signed)
Patient states she is taking a calcium chew and a Vitamin D3 capsule daily. Added to her med list. Kathrene Alu RN BSN

## 2015-01-31 ENCOUNTER — Other Ambulatory Visit: Payer: Self-pay | Admitting: Internal Medicine

## 2015-02-10 ENCOUNTER — Ambulatory Visit (HOSPITAL_BASED_OUTPATIENT_CLINIC_OR_DEPARTMENT_OTHER)
Admission: RE | Admit: 2015-02-10 | Discharge: 2015-02-10 | Disposition: A | Discharge status: Home / Self Care | Payer: Medicare Other | Source: Ambulatory Visit | Attending: Obstetrics & Gynecology | Admitting: Obstetrics & Gynecology

## 2015-02-10 DIAGNOSIS — Z87891 Personal history of nicotine dependence: Secondary | ICD-10-CM | POA: Insufficient documentation

## 2015-02-10 DIAGNOSIS — E039 Hypothyroidism, unspecified: Secondary | ICD-10-CM | POA: Insufficient documentation

## 2015-02-10 DIAGNOSIS — M810 Age-related osteoporosis without current pathological fracture: Secondary | ICD-10-CM

## 2015-02-10 DIAGNOSIS — M8580 Other specified disorders of bone density and structure, unspecified site: Secondary | ICD-10-CM | POA: Insufficient documentation

## 2015-02-12 ENCOUNTER — Encounter: Payer: Self-pay | Admitting: Gastroenterology

## 2015-03-01 ENCOUNTER — Other Ambulatory Visit: Payer: Self-pay | Admitting: Internal Medicine

## 2015-03-02 MED ORDER — HYDROCODONE-ACETAMINOPHEN 5-325 MG PO TABS: 1.0000 | ORAL_TABLET | Freq: Four times a day (QID) | ORAL | Status: DC | PRN

## 2015-03-02 NOTE — Telephone Encounter (Signed)
Pt is requesting refill on Hydrocodone.  Last OV: 11/27/2014, CPE scheduled 09/08/2015 at 0900 Last Fill: 11/27/2014 #300 and 0RF Pt takes 1 tablet q6h PRN UDS: 09/01/2014 Low risk  Please advise.

## 2015-03-02 NOTE — Telephone Encounter (Signed)
Rx placed at front desk for pick up at Pt's convenience. Pt informed via MyChart.  

## 2015-03-02 NOTE — Telephone Encounter (Signed)
ok #300 no refills

## 2015-03-12 ENCOUNTER — Emergency Department (HOSPITAL_BASED_OUTPATIENT_CLINIC_OR_DEPARTMENT_OTHER): Payer: Medicare Other

## 2015-03-12 ENCOUNTER — Emergency Department (HOSPITAL_BASED_OUTPATIENT_CLINIC_OR_DEPARTMENT_OTHER)
Admission: EM | Admit: 2015-03-12 | Discharge: 2015-03-12 | Disposition: A | Discharge status: Home / Self Care | Payer: Medicare Other | Attending: Emergency Medicine | Admitting: Emergency Medicine

## 2015-03-12 ENCOUNTER — Encounter (HOSPITAL_BASED_OUTPATIENT_CLINIC_OR_DEPARTMENT_OTHER): Payer: Self-pay | Admitting: *Deleted

## 2015-03-12 DIAGNOSIS — H409 Unspecified glaucoma: Secondary | ICD-10-CM | POA: Insufficient documentation

## 2015-03-12 DIAGNOSIS — S79912A Unspecified injury of left hip, initial encounter: Secondary | ICD-10-CM | POA: Insufficient documentation

## 2015-03-12 DIAGNOSIS — E039 Hypothyroidism, unspecified: Secondary | ICD-10-CM | POA: Insufficient documentation

## 2015-03-12 DIAGNOSIS — Y998 Other external cause status: Secondary | ICD-10-CM | POA: Insufficient documentation

## 2015-03-12 DIAGNOSIS — Y9341 Activity, dancing: Secondary | ICD-10-CM | POA: Diagnosis not present

## 2015-03-12 DIAGNOSIS — Z880 Allergy status to penicillin: Secondary | ICD-10-CM | POA: Insufficient documentation

## 2015-03-12 DIAGNOSIS — Z79899 Other long term (current) drug therapy: Secondary | ICD-10-CM | POA: Diagnosis not present

## 2015-03-12 DIAGNOSIS — Z8679 Personal history of other diseases of the circulatory system: Secondary | ICD-10-CM | POA: Diagnosis not present

## 2015-03-12 DIAGNOSIS — Z86010 Personal history of colonic polyps: Secondary | ICD-10-CM | POA: Diagnosis not present

## 2015-03-12 DIAGNOSIS — Z87891 Personal history of nicotine dependence: Secondary | ICD-10-CM | POA: Insufficient documentation

## 2015-03-12 DIAGNOSIS — Y9289 Other specified places as the place of occurrence of the external cause: Secondary | ICD-10-CM | POA: Diagnosis not present

## 2015-03-12 DIAGNOSIS — M8580 Other specified disorders of bone density and structure, unspecified site: Secondary | ICD-10-CM | POA: Insufficient documentation

## 2015-03-12 DIAGNOSIS — F329 Major depressive disorder, single episode, unspecified: Secondary | ICD-10-CM | POA: Insufficient documentation

## 2015-03-12 DIAGNOSIS — X58XXXA Exposure to other specified factors, initial encounter: Secondary | ICD-10-CM | POA: Diagnosis not present

## 2015-03-12 NOTE — ED Notes (Signed)
Pt returned from CT °

## 2015-03-12 NOTE — ED Provider Notes (Signed)
Medical screening examination/treatment/procedure(s) were conducted as a shared visit with non-physician practitioner(s) and myself.  I personally evaluated the patient during the encounter.   69 year old female who presents with left hip pain. History of hypothyroidism, osteopenia, fibromyalgia, and migraine headaches. Felt pop and pain in her left hip after taking a step back during dancing yesterday evening. No falls or trauma. No numbness or tingling. Extremity is neurovascularly intact. She is well-appearing and in no acute distress. Her x-ray is negative for acute fracture, but given her persistent pain, difficulty ambulating a CT hip was performed. No evidence of occult fracture but does have evidence of muscle tear. We will discharge with orthopedic surgery follow-up and supportive care. Patient has walkers and wheelchair at home that she will use  until follow-up.   Forde Dandy, MD 03/12/15 [id number]

## 2015-03-12 NOTE — ED Notes (Signed)
PA at bedside.

## 2015-03-12 NOTE — Discharge Instructions (Signed)

## 2015-03-12 NOTE — ED Notes (Signed)
Pain in her left hip since stepping wrong while dancing last night. Painful to walk.

## 2015-03-12 NOTE — ED Provider Notes (Signed)
CSN: [id number]     Arrival date & time 03/12/15  1112 History   First MD Initiated Contact with Patient 03/12/15 1215     Chief Complaint  Patient presents with  . Hip Pain     (Consider location/radiation/quality/duration/timing/severity/associated sxs/prior Treatment) HPI  PCP: Kathlene November, MD PMH: Hypothyroidism, migraine, osteopenia, depression, pleuritic chest pain, fibromyalgia, glaucoma  [name] is a 69 y.o.  female  CHIEF COMPLAINT: Left hip pain  Patient was out dancing last night she took a step back she felt a sharp pop in her left hip. She did not fall to the ground. Since then she has only been able to ambulate with assistance. She has significant pain with movement of the left hip but pain is improved with resting. She denies having any numbness or tingling but has felt shooting pains down her left leg area. Denies hx of surgery or previous injury  ROS: The patient denies diaphoresis, fever, headache, weakness (general or focal), confusion, change of vision,  dysphagia, aphagia, shortness of breath,  abdominal pains, nausea, vomiting, diarrhea, lower extremity swelling, rash, neck pain, chest pain   Past Medical History  Diagnosis Date  . Hypothyroidism   . Migraine   . Osteopenia   . Colonic polyp     bx adenomatous polyps, next scope 2010  . Pleuritic chest pain     etiology unclear, has beeb eval by pulmonary and rheumatology (autoimmune dz?)  . Depression   . Chest pain     stress test @ [facility] cardiology (-)  . Fibromyalgia     see rheumatology  . 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 Glaucoma    Past Surgical History  Procedure Laterality Date  . Tonsillectomy    . Right rotator cuff repair  2010  . 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  . Esophagogastroduodenoscopy (egd) with propofol N/A 03/27/2013    Procedure: ESOPHAGOGASTRODUODENOSCOPY (EGD) WITH PROPOFOL;  Surgeon: Milus Banister, MD;  Location: [facility] ENDOSCOPY;  Service: Endoscopy;   Laterality: N/A;  possible dil  . Plastic surgery face  2013   Family History  Problem Relation Age of Onset  . Adopted: Yes  . Colon cancer Neg Hx   . Cancer Neg Hx   . Depression Neg Hx   . Diabetes Neg Hx   . Stroke Neg Hx   . Hypertension Neg Hx   . Heart disease Neg Hx    Social History  Substance Use Topics  . Smoking status: Former Smoker -- 1.00 packs/day for 38.5 years    Types: Cigarettes    Quit date: 01/13/2004  . Smokeless tobacco: Never Used     Comment: quit 2005  . Alcohol Use: 0.0 oz/week    0 Standard drinks or equivalent per week     Comment: rare   OB History    Gravida Para Term Preterm AB TAB SAB Ectopic Multiple Living   0 0 0 0 0 0 0 0 0 0      Review of Systems   Review of Systems  Gen: no weight loss, fevers, chills, night sweats  Eyes: no occular draining, occular pain,  No visual changes  Nose: no epistaxis or rhinorrhea  Mouth: no dental pain, no sore throat  Neck: no neck pain  Lungs: No hemoptysis. No wheezing or coughing CV:  No palpitations, dependent edema or orthopnea. No chest pain Abd: no diarrhea. No nausea or vomiting, No abdominal pain  GU:  no dysuria or gross hematuria  MSK:  No muscle weakness, +muscular pain Neuro: no headache, no focal neurologic deficits  Skin: no rash , no wounds Psyche: no complaints of depression or anxiety    Allergies  Alendronate sodium; Bactrim; Milnacipran; and Penicillins  Home Medications   Prior to Admission medications   Medication Sig Start Date End Date Taking? Authorizing Provider  ALPRAZolam Duanne Moron) 0.5 MG tablet Take 1 tablet (0.5 mg total) by mouth at bedtime as needed for anxiety. 09/01/14   Colon Branch, MD  azelastine (ASTELIN) 0.1 % nasal spray Place 2 sprays into both nostrils at bedtime as needed for rhinitis. Use in each nostril as directed 11/27/14   Colon Branch, MD  calcium carbonate (OS-CAL) 1250 (500 CA) MG chewable tablet Chew 1 tablet by mouth daily.    Historical  Provider, MD  Cholecalciferol (VITAMIN D3) 1000 UNITS CAPS Take 1,200 Units by mouth.    Historical Provider, MD  conjugated estrogens (PREMARIN) vaginal cream Place 1 Applicatorful vaginally 2 (two) times a week. 01/07/15   Lavonia Drafts, MD  HYDROcodone-acetaminophen (NORCO/VICODIN) 5-325 MG tablet Take 1 tablet by mouth every 6 (six) hours as needed for severe pain. 03/02/15   Colon Branch, MD  levothyroxine (SYNTHROID, LEVOTHROID) 88 MCG tablet Take 1 tablet (88 mcg total) by mouth daily before breakfast. 02/01/15   Colon Branch, MD  Multiple Vitamin (MULTIVITAMIN) capsule Take 1 capsule by mouth daily.      Historical Provider, MD  omeprazole (PRILOSEC OTC) 20 MG tablet Take 20 mg by mouth daily.    Historical Provider, MD  polycarbophil (FIBERCON) 625 MG tablet Take 625 mg by mouth daily.      Historical Provider, MD  RESTASIS 0.05 % ophthalmic emulsion Place 1 drop into both eyes 2 (two) times daily.  01/03/11   Historical Provider, MD   BP 145/86 mmHg  Pulse 71  Temp(Src) 98.3 F (36.8 C) (Oral)  Resp 18  Ht 5\' 6"  (1.676 m)  Wt 76.204 kg  BMI 27.13 kg/m2  SpO2 100% Physical Exam  Constitutional: She appears well-developed and well-nourished. No distress.  HENT:  Head: Normocephalic and atraumatic.  Eyes: Pupils are equal, round, and reactive to light.  Neck: Normal range of motion. Neck supple.  Cardiovascular: Normal rate and regular rhythm.   Pulmonary/Chest: Effort normal.  Abdominal: Soft.  Musculoskeletal:  Patient has intact sensation and full range of motion of toes, ankle, knee and left hip. She has tenderness to palpation to the lateral left hip. No skin changes or wounds, no rashes noted. Patient has decreased strength against resistance of the left hip due to pain.  Neurological: She is alert.  Skin: Skin is warm and dry.  Nursing note and vitals reviewed.   ED Course  Procedures (including critical care time) Labs Review Labs Reviewed - No data to  display  Imaging Review Ct Hip Left Wo Contrast  03/12/2015  CLINICAL DATA:  Left hip pain after dancing yesterday. EXAM: CT OF THE LEFT HIP WITHOUT CONTRAST TECHNIQUE: Multidetector CT imaging of the left hip was performed according to the standard protocol. Multiplanar CT image reconstructions were also generated. COMPARISON:  Radiographs dated 03/12/2015 FINDINGS: There are old healed fractures of the left inferior and superior pubic rami. There is slight osteoarthritis with subcortical cyst formation of the acetabulum and slight superior lateral joint space narrowing. There are no fractures. There is soft tissue edema or hemorrhage along the superficial fascia of the left gluteus medius  muscle best seen on axial images 14 through 43 of series 3. I suspect the patient tore the fascia or suffered a soft tissue contusion. 13 mm cyst on the left ovary. No adenopathy. IMPRESSION: 1. Focal area of probable hemorrhage in the subcutaneous fat just superficial to the fascia of the left gluteus medius muscle, probably due to an underlying fascia all tear or muscle tear. 2. Slight osteoarthritis of the left hip joint. 3. Old healed fractures of the left inferior and superior pubic rami. Electronically Signed   By: Lorriane Shire M.D.   On: 03/12/2015 13:17   Dg Hip Unilat With Pelvis 2-3 Views Left  03/12/2015  CLINICAL DATA:  While dancing last night, step backwards with LEFT foot and felt a pop in her LEFT hip, continued LEFT hip pain EXAM: DG HIP (WITH OR WITHOUT PELVIS) 2-3V LEFT COMPARISON:  None FINDINGS: Osseous demineralization. SI joints symmetric and preserved. Hip joints symmetric with probable minimal joint space narrowing bilaterally. Old healed fracture inferior LEFT pubic ramus. No acute fracture, dislocation call or bone destruction. IMPRESSION: Question minimal narrowing/degenerative changes of hip joints bilaterally. Old healed LEFT inferior pubic ramus fracture. No acute abnormalities.  Electronically Signed   By: Lavonia Dana M.D.   On: 03/12/2015 12:14   I have personally reviewed and evaluated these images and lab results as part of my medical decision-making.   EKG Interpretation None      MDM   Final diagnoses:  Hip injury, left, initial encounter   CT scan shows probable fascial or muscle tear to left gluteal medialis muscle.  Dr. Oleta Mouse has seen patient as well, patient has walker, cane and wheelchair at home and feels comfortable going home and using this device family. She has a with [facility] Dr. already and will follow-up closely as directed. Tylenol and IV present for pain, patient already has Norco at home if needed for some stronger.  Medications - No data to display   I feel the patient has had an appropriate workup for their chief complaint at this time and likelihood of emergent condition existing is low. Discussed s/sx that warrant return to the ED.  Filed Vitals:   03/12/15 1118  BP: 145/86  Pulse: 71  Temp: 98.3 F (36.8 C)  Resp: [address] PA-C 03/12/15 1403  Forde Dandy, MD 03/14/15 1155

## 2015-03-15 ENCOUNTER — Ambulatory Visit: Payer: Medicare Other | Admitting: Physical Therapy

## 2015-05-18 ENCOUNTER — Encounter: Payer: Self-pay | Admitting: Physical Therapy

## 2015-05-18 ENCOUNTER — Ambulatory Visit: Payer: PPO | Attending: Internal Medicine | Admitting: Physical Therapy

## 2015-05-18 DIAGNOSIS — R29898 Other symptoms and signs involving the musculoskeletal system: Secondary | ICD-10-CM

## 2015-05-18 DIAGNOSIS — M545 Low back pain: Secondary | ICD-10-CM | POA: Diagnosis not present

## 2015-05-18 DIAGNOSIS — M6289 Other specified disorders of muscle: Secondary | ICD-10-CM | POA: Diagnosis not present

## 2015-05-19 ENCOUNTER — Telehealth: Payer: Self-pay | Admitting: *Deleted

## 2015-05-19 NOTE — Telephone Encounter (Signed)
LM on voicemail concerning BMD results.  I had Dr Nehemiah Settle to review and she does have osteopenia.  Recommendations are 1200 mg of Ca and 800 mg of Vitamin D daily.  Recommend weight bearing exercises daily.

## 2015-05-19 NOTE — Therapy (Signed)
[facility] [address]  [place], [place], [address] Phone: [phone]   Fax:  [phone]  Physical Therapy Evaluation  Patient Details  Name: [name] MRN: [id number] Date of Birth: [birth year] Referring Provider: Leigh Aurora, MD  Encounter Date: 05/18/2015      PT End of Session - 05/18/15 1420    Visit Number 1   Number of Visits 12   Date for PT Re-Evaluation 07/13/15   PT Start Time [id number]   PT Stop Time 1503   PT Time Calculation (min) 44 min      Past Medical History  Diagnosis Date  . Hypothyroidism   . Migraine   . Osteopenia   . Colonic polyp     bx adenomatous polyps, next scope 2010  . Pleuritic chest pain     etiology unclear, has beeb eval by pulmonary and rheumatology (autoimmune dz?)  . Depression   . Chest pain     stress test @ [facility] cardiology (-)  . Fibromyalgia     see rheumatology  . 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 Glaucoma     Past Surgical History  Procedure Laterality Date  . Tonsillectomy    . Right rotator cuff repair  2010  . 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  . Esophagogastroduodenoscopy (egd) with propofol N/A 03/27/2013    Procedure: ESOPHAGOGASTRODUODENOSCOPY (EGD) WITH PROPOFOL;  Surgeon: Milus Banister, MD;  Location: [facility];  Service: Endoscopy;  Laterality: N/A;  possible dil  . Plastic surgery face  2013    There were no vitals filed for this visit.  Visit Diagnosis:  Bilateral low back pain, with sciatica presence unspecified  Weakness of both hips      Subjective Assessment - 05/18/15 1430    Subjective Pt with long history of lower back pain.  States pain seems to be worsening. Notes intermittent L LE n/t with supine lying - denies these symptoms any other time.   Patient Stated Goals strengthen muscles to lessen back pain   Currently in Pain? Yes   Pain Score --  pain typically progresses from 3/10 in AM to 8/10 in PM    Pain Location Back   Pain Orientation Lower   Pain Descriptors / Indicators Sharp;Nagging   Pain Onset More than a month ago   Pain Frequency Intermittent   Aggravating Factors  prolonged sitting   Pain Relieving Factors supine lying, slouching            [facility] PT Assessment - 05/18/15 1500    Assessment   Medical Diagnosis LBP   Referring Provider Leigh Aurora, MD   Onset Date/Surgical Date 04/10/13   Next MD Visit one year   Balance Screen   Has the patient fallen in the past 6 months No   Has the patient had a decrease in activity level because of a fear of falling?  No   Is the patient reluctant to leave their home because of a fear of falling?  No   Prior Function   Vocation Retired   Leisure denies regular exercise; enjoys playing cards and movies; only exercise includes housework and walking dogs   Observation/Other Assessments   Focus on Therapeutic Outcomes (FOTO)  46% limitation   Posture/Postural Control   Posture Comments posterior pelvic tilt with reduced lumbar lordosis   AROM   Lumbar Flexion hands to mid shins - no pain  Lumbar Extension 50% normal - no pain   Strength   Right Hip Flexion 4+/5   Right Hip Extension 4+/5   Right Hip External Rotation  4/5   Right Hip Internal Rotation 5/5   Right Hip ABduction 4/5   Right Hip ADduction 5/5   Left Hip Flexion 4/5  mild anterolateral hip pain   Left Hip Extension 4/5   Left Hip External Rotation 4/5   Left Hip Internal Rotation 5/5   Left Hip ABduction 3+/5  lateral hip pain   Left Hip ADduction 4+/5   Right Knee Flexion 4+/5   Right Knee Extension 4+/5   Left Knee Flexion 4/5   Left Knee Extension 4+/5   Flexibility   Soft Tissue Assessment /Muscle Length yes   Hamstrings B tightness   Quadriceps B tightness        TODAY'S TREATMENT TherEx - HS Stretch, Bridge, HEP instruction          PT Education - 05/19/15 1128    Education provided Yes   Education Details initial HEP    Person(s) Educated Patient   Methods Explanation;Demonstration;Handout   Comprehension Verbalized understanding;Returned demonstration          PT Short Term Goals - 05/18/15 1140    PT SHORT TERM GOAL #1   Title pt independent with initial HEP by 05/27/15   Status New           PT Long Term Goals - 05/18/15 1140    PT LONG TERM GOAL #1   Title pt independent with advanced HEP as necessary by 07/13/15   Status New   PT LONG TERM GOAL #2   Title pt displays B LE MMT 4+/5 or better without c/o LBP by 07/13/15   Status New   PT LONG TERM GOAL #3   Title pt reports LBP improves to no greater than 4/10 at worst and 2/10 on AVG to allow performance of ADLs and chores without restriction by pain by 07/13/15   Status New               Plan - 05/18/15 1500    Clinical Impression Statement pt with long history of chronic LBP stating pain seems to have worsened over the past 2 years.  She leads a rather sedentary lifestyle and displays weakness throughout LE and core.  Her posture includes posterior pelvic tilt with reduced lumbar lordosis along with rounded shoulders and forward head.  She displays significant tightness in B hamstrings which likely contributes to posture and possibly LBP.  She recently injured L glutes and there is some continued tenderness in this area.  Since these muscles are quite involved in lumbopelvic stability training we will be addressing this pain as well.  No neural involvement noted with special testing today.  At this point, LBP seems mechanical in nature and mostly related to postural dysfunction.  We will address as tolerated through stability training.  Plan is to see pt 2x/wk for 12 visits. I would like this to happen over a 6 week period; however, due to schedule availability, this will take at least 8 weeks.  I am therefore writing POC for 12 visits in an 8 week period.   Pt will benefit from skilled therapeutic intervention in order to improve on the  following deficits Pain;Postural dysfunction;Decreased strength;Decreased mobility;Impaired flexibility;Improper body mechanics   Rehab Potential Good   PT Frequency 2x / week   PT Duration 8 weeks  12 visits in 8 weeks  PT Treatment/Interventions Therapeutic exercise;Therapeutic activities;Manual techniques;Moist Heat;Traction;Taping;Patient/family education;Neuromuscular re-education;Functional mobility training;Electrical Stimulation;Cryotherapy   PT Next Visit Plan lumbopelvic stability; HS stretching; manual PRN; possible mechanical traction with neutral pull   Consulted and Agree with Plan of Care Patient          G-Codes - 06-04-15 1432    Functional Assessment Tool Used foto 46% limitation   Functional Limitation Mobility: Walking and moving around   Mobility: Walking and Moving Around Current Status [phone]) At least 40 percent but less than 60 percent impaired, limited or restricted   Mobility: Walking and Moving Around Goal Status [phone]) At least 20 percent but less than 40 percent impaired, limited or restricted       Problem List Patient Active Problem List   Diagnosis Date Noted  . Insomnia 09/01/2014  . GERD (gastroesophageal reflux disease) 03/27/2013  . Dysphagia, pharyngoesophageal phase 03/27/2013  . Annual physical exam 06/12/2011  . Sinusitis, chronic 02/02/2011  . Osteoporosis 06/08/2010  . Fibromyalgia, pain mngmt 01/29/2009  . LIPOMA 09/30/2008  . DEPRESSION 10/29/2007  . COMMON MIGRAINE 03/04/2007  . Hypothyroidism 05/15/2006  . COLONIC POLYPS, HX OF 05/15/2006    Viana Sleep PT, OCS 05/19/2015, 11:46 AM  [facility] [address]  [place], [place], [address] Phone: [phone]   Fax:  [phone]  Name: [name] MRN: [id number] Date of Birth: February 17, 1946

## 2015-05-21 ENCOUNTER — Telehealth: Payer: Self-pay | Admitting: Internal Medicine

## 2015-05-21 ENCOUNTER — Encounter (HOSPITAL_BASED_OUTPATIENT_CLINIC_OR_DEPARTMENT_OTHER): Payer: Self-pay | Admitting: *Deleted

## 2015-05-21 ENCOUNTER — Observation Stay (HOSPITAL_BASED_OUTPATIENT_CLINIC_OR_DEPARTMENT_OTHER)
Admission: EM | Admit: 2015-05-21 | Discharge: 2015-05-22 | Disposition: A | Discharge status: Home / Self Care | Payer: PPO | Attending: Internal Medicine | Admitting: Internal Medicine

## 2015-05-21 ENCOUNTER — Emergency Department (HOSPITAL_BASED_OUTPATIENT_CLINIC_OR_DEPARTMENT_OTHER): Payer: PPO

## 2015-05-21 DIAGNOSIS — R0789 Other chest pain: Secondary | ICD-10-CM | POA: Diagnosis not present

## 2015-05-21 DIAGNOSIS — R079 Chest pain, unspecified: Principal | ICD-10-CM | POA: Insufficient documentation

## 2015-05-21 DIAGNOSIS — R202 Paresthesia of skin: Secondary | ICD-10-CM

## 2015-05-21 DIAGNOSIS — E039 Hypothyroidism, unspecified: Secondary | ICD-10-CM | POA: Diagnosis not present

## 2015-05-21 DIAGNOSIS — R200 Anesthesia of skin: Secondary | ICD-10-CM | POA: Diagnosis present

## 2015-05-21 DIAGNOSIS — F418 Other specified anxiety disorders: Secondary | ICD-10-CM | POA: Diagnosis present

## 2015-05-21 DIAGNOSIS — G43009 Migraine without aura, not intractable, without status migrainosus: Secondary | ICD-10-CM | POA: Diagnosis present

## 2015-05-21 DIAGNOSIS — K219 Gastro-esophageal reflux disease without esophagitis: Secondary | ICD-10-CM | POA: Diagnosis present

## 2015-05-21 DIAGNOSIS — M810 Age-related osteoporosis without current pathological fracture: Secondary | ICD-10-CM | POA: Diagnosis present

## 2015-05-21 DIAGNOSIS — M797 Fibromyalgia: Secondary | ICD-10-CM | POA: Diagnosis present

## 2015-05-21 LAB — CBC WITH DIFFERENTIAL/PLATELET
BASOS PCT: 0 %
Basophils Absolute: 0 10*3/uL (ref 0.0–0.1)
EOS ABS: 0.3 10*3/uL (ref 0.0–0.7)
Eosinophils Relative: 4 %
HEMATOCRIT: 39.3 % (ref 36.0–46.0)
Hemoglobin: 12.5 g/dL (ref 12.0–15.0)
Lymphocytes Relative: 28 %
Lymphs Abs: 2.2 10*3/uL (ref 0.7–4.0)
MCH: 30.1 pg (ref 26.0–34.0)
MCHC: 31.8 g/dL (ref 30.0–36.0)
MCV: 94.7 fL (ref 78.0–100.0)
MONO ABS: 0.8 10*3/uL (ref 0.1–1.0)
MONOS PCT: 10 %
Neutro Abs: 4.5 10*3/uL (ref 1.7–7.7)
Neutrophils Relative %: 58 %
Platelets: 245 10*3/uL (ref 150–400)
RBC: 4.15 MIL/uL (ref 3.87–5.11)
RDW: 12.9 % (ref 11.5–15.5)
WBC: 7.7 10*3/uL (ref 4.0–10.5)

## 2015-05-21 LAB — COMPREHENSIVE METABOLIC PANEL
ALBUMIN: 3.8 g/dL (ref 3.5–5.0)
ALT: 13 U/L — ABNORMAL LOW (ref 14–54)
ANION GAP: 7 (ref 5–15)
AST: 17 U/L (ref 15–41)
Alkaline Phosphatase: 65 U/L (ref 38–126)
BILIRUBIN TOTAL: 0.4 mg/dL (ref 0.3–1.2)
BUN: 11 mg/dL (ref 6–20)
CHLORIDE: 103 mmol/L (ref 101–111)
CO2: 27 mmol/L (ref 22–32)
Calcium: 8.6 mg/dL — ABNORMAL LOW (ref 8.9–10.3)
Creatinine, Ser: 0.74 mg/dL (ref 0.44–1.00)
Glucose, Bld: 116 mg/dL — ABNORMAL HIGH (ref 65–99)
POTASSIUM: 3.7 mmol/L (ref 3.5–5.1)
SODIUM: 137 mmol/L (ref 135–145)
TOTAL PROTEIN: 6.8 g/dL (ref 6.5–8.1)

## 2015-05-21 LAB — TROPONIN I

## 2015-05-21 LAB — MAGNESIUM: Magnesium: 2 mg/dL (ref 1.7–2.4)

## 2015-05-21 LAB — LIPASE, BLOOD: LIPASE: 27 U/L (ref 11–51)

## 2015-05-21 MED ORDER — ASPIRIN 81 MG PO CHEW
324.0000 mg | CHEWABLE_TABLET | Freq: Once | ORAL | Status: AC
  Administered 2015-05-21: 324 mg via ORAL
  Filled 2015-05-21: qty 4

## 2015-05-21 NOTE — ED Notes (Signed)
Pressure in her chest on and off x 3 weeks. States she feels flushed prior to the pressure that last about 30 minutes before resolving without intervention.

## 2015-05-21 NOTE — Telephone Encounter (Signed)
Patient called to schedule appt. Stated that she is having recurring chest pressure. Informed patient that I needed to transfer her to [facility] to speak with a nurse but she declined stating that it was not happening at the moment and she just wanted an appointment to see her doctor. Scheduled her for Monday 2/13 with Dr. Larose Kells

## 2015-05-21 NOTE — Telephone Encounter (Signed)
Thank you :)

## 2015-05-21 NOTE — ED Provider Notes (Signed)
[name] is a 71 y.o. female who has a hx of pleuritic chest pain, hypothyroidism, and fibromyalgia presents to the Emergency Department complaining of intermittent, gradual worsening chest pain onset 3 weeks ago. She states she has had this happen 3 times in the past 3 weeks. Pt notes that she begins to feel pressure that radiates down her arms and begins to feel short of breath and flushed with each episode. She has a hx of pleuritic chest pain but report this feels different. She was told to come to the ED to be evaluated by her doctor. Pt has not had a stress test done in years. Denies vomiting, abdominal pain, pain or swelling in legs, hx of GERD, hx of ulcers.   CTAB RRR, no distress no murmurs  EKG reassuring.  HEART score 5, concern for angina. Doubt dissection, doubt PE. History concerning, will admit for rule out.    I personally performed the services described in this documentation, which was scribed in my presence. The recorded information has been reviewed and is accurate.   Ezequiel Essex, MD 05/21/15 2022

## 2015-05-21 NOTE — Telephone Encounter (Signed)
Can you call Pt to triage?

## 2015-05-21 NOTE — Progress Notes (Signed)
Patient ID: [name], female   DOB: June 06, 1945, 70 y.o.   MRN: [id number]  Accepted to a telemetry bed to [facility].  Please call the floor manager when the patient arrives to the hospital for admitting physician assingment and admitting orders.     Chest Pain   HPI  [name] is a 70 year old female with PMHx of hypothyroidism, pleurisy, osteopenia, fibromyalgia and depression presenting with chest pain. She has had 3 episodes over the past 3 weeks. She describes the chest pain as a central pressures that radiates up to both sides of her chest. Each episode occurred when she was at rest. Her most recent episode was last evening. During the first episode, she took an aspirin and the chest pain resolved. She did not take any medications during the other episodes. All 3 episodes resolved within approximately 30 minutes. She states the chest pain was not exertional though she did not get up and try to walk around during the chest pain episode. She reports feeling flushed and lightheaded during each episode. She reports diaphoresis during the first episode but not the other 2. She notes a tingling sensation in her right arm during the chest pain episode last evening. She states that this is only present for a couple seconds before resolving. She denies weakness of the extremities, visual disturbance, facial droop or speech slur. She is also endorsing worsening shortness of breath over the past 3 weeks. She states that she is mildly short of breath on exertion at baseline due to her smoking history. She states that she has noticed mild worsening of this baseline shortness of breath. She gives the example of talking on the phone and walking getting harder than it usually is. She has a cuff at home which she used to take her blood pressure during the chest pain episodes. She reports they were around 130/80. She has a personal cardiac history. She had an echocardiogram performed about 10 years ago while working up  her chronic pleuritic chest pain. She is adopted and is unsure of her family cardiac history.       Vent. rate 79 BPM PR interval 154 ms QRS duration 94 ms QT/QTc 400/458 ms P-R-T axes 72 -14 67 Sinus rhythm with Premature atrial complexes Otherwise normal ECG  Troponin I [id number] Collected: 05/21/15 1850    Updated: 05/21/15 1930    Specimen Type: Blood    Specimen Source: Vein     Troponin I <0.03 ng/mL   Comprehensive metabolic panel [id number] (Abnormal) Collected: 05/21/15 1850   Updated: 05/21/15 1927    Specimen Type: Blood    Specimen Source: Vein     Sodium 137 mmol/L    Potassium 3.7 mmol/L    Chloride 103 mmol/L    CO2 27 mmol/L    Glucose, Bld 116 (H) mg/dL    BUN 11 mg/dL    Creatinine, Ser 0.74 mg/dL    Calcium 8.6 (L) mg/dL    Total Protein 6.8 g/dL    Albumin 3.8 g/dL    AST 17 U/L    ALT 13 (L) U/L    Alkaline Phosphatase 65 U/L    Total Bilirubin 0.4 mg/dL    GFR calc non Af Amer >60 mL/min    GFR calc Af Amer >60 mL/min    Anion gap 7   Lipase, blood [id number] Collected: 05/21/15 1850   Updated: 05/21/15 1927    Specimen Type: Blood    Specimen Source: Vein  Lipase 27 U/L   CBC with Differential/Platelet [id number] Collected: 05/21/15 1850   Updated: 05/21/15 1908    Specimen Type: Blood    Specimen Source: Vein     WBC 7.7 K/uL    RBC 4.15 MIL/uL    Hemoglobin 12.5 g/dL    HCT 39.3 %    MCV 94.7 fL    MCH 30.1 pg    MCHC 31.8 g/dL    RDW 12.9 %    Platelets 245 K/uL    Neutrophils Relative % 58 %    Neutro Abs 4.5 K/uL    Lymphocytes Relative 28 %    Lymphs Abs 2.2 K/uL    Monocytes Relative 10 %    Monocytes Absolute 0.8 K/uL    Eosinophils Relative 4 %    Eosinophils Absolute 0.3 K/uL    Basophils Relative 0 %    Basophils Absolute 0.0 K/uL        Study Result     CLINICAL DATA: 70 year old female with chest pain  EXAM: CHEST 2 VIEW  COMPARISON: CT  dated 09/11/2014  FINDINGS: The heart size and mediastinal contours are within normal limits. Both lungs are clear. The visualized skeletal structures are unremarkable.  IMPRESSION: No active cardiopulmonary disease.

## 2015-05-21 NOTE — ED Provider Notes (Signed)
CSN: [id number]     Arrival date & time 05/21/15  1806 History   First MD Initiated Contact with Patient 05/21/15 1811     Chief Complaint  Patient presents with  . Chest Pain   HPI  [name] is a 70 year old female with PMHx of hypothyroidism, pleurisy, osteopenia, fibromyalgia and depression presenting with chest pain. She has had 3 episodes over the past 3 weeks. She describes the chest pain as a central pressures that radiates up to both sides of her chest. Each episode occurred when she was at rest. Her most recent episode was last evening. During the first episode, she took an aspirin and the chest pain resolved. She did not take any medications during the other episodes. All 3 episodes resolved within approximately 30 minutes. She states the chest pain was not exertional though she did not get up and try to walk around during the chest pain episode. She reports feeling flushed and lightheaded during each episode. She reports diaphoresis during the first episode but not the other 2. She notes a tingling sensation in her right arm during the chest pain episode last evening. She states that this is only present for a couple seconds before resolving. She denies weakness of the extremities, visual disturbance, facial droop or speech slur. She is also endorsing worsening shortness of breath over the past 3 weeks. She states that she is mildly short of breath on exertion at baseline due to her smoking history. She states that she has noticed mild worsening of this baseline shortness of breath. She gives the example of talking on the phone and walking getting harder than it usually is. She has a cuff at home which she used to take her blood pressure during the chest pain episodes. She reports they were around 130/80. She has a personal cardiac history. She had an echocardiogram performed about 10 years ago while working up her chronic pleuritic chest pain. She is adopted and is unsure of her family cardiac  history.  Past Medical History  Diagnosis Date  . Hypothyroidism   . Migraine   . Osteopenia   . Colonic polyp     bx adenomatous polyps, next scope 2010  . Pleuritic chest pain     etiology unclear, has beeb eval by pulmonary and rheumatology (autoimmune dz?)  . Depression   . Chest pain     stress test @ [facility] cardiology (-)  . Fibromyalgia     see rheumatology  . 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 Glaucoma    Past Surgical History  Procedure Laterality Date  . Tonsillectomy    . Right rotator cuff repair  2010  . 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  . Esophagogastroduodenoscopy (egd) with propofol N/A 03/27/2013    Procedure: ESOPHAGOGASTRODUODENOSCOPY (EGD) WITH PROPOFOL;  Surgeon: Milus Banister, MD;  Location: [facility];  Black: Endoscopy;  Laterality: N/A;  possible dil  . Plastic surgery face  2013   Family History  Problem Relation Age of Onset  . Adopted: Yes  . Colon cancer Neg Hx   . Cancer Neg Hx   . Depression Neg Hx   . Diabetes Neg Hx   . Stroke Neg Hx   . Hypertension Neg Hx   . Heart disease Neg Hx    Social History  Substance Use Topics  . Smoking status: Former Smoker -- 1.00 packs/day for 38.5 years    Types: Cigarettes  Quit date: 01/13/2004  . Smokeless tobacco: Never Used     Comment: quit 2005  . Alcohol Use: 0.0 oz/week    0 Standard drinks or equivalent per week     Comment: rare   OB History    Gravida Para Term Preterm AB TAB SAB Ectopic Multiple Living   0 0 0 0 0 0 0 0 0 0      Review of Systems  Constitutional: Positive for diaphoresis. Negative for fever and chills.  HENT: Negative for congestion, rhinorrhea and sore throat.   Eyes: Negative for visual disturbance.  Respiratory: Positive for shortness of breath. Negative for cough and wheezing.   Cardiovascular: Positive for chest pain.  Gastrointestinal: Negative for nausea, vomiting and abdominal pain.  Musculoskeletal: Negative for back pain and neck  pain.  Neurological: Positive for light-headedness. Negative for dizziness, syncope, weakness and headaches.  All other systems reviewed and are negative.     Allergies  Alendronate sodium; Bactrim; Milnacipran; and Penicillins  Home Medications   Prior to Admission medications   Medication Sig Start Date End Date Taking? Authorizing Provider  ALPRAZolam Duanne Moron) 0.5 MG tablet Take 1 tablet (0.5 mg total) by mouth at bedtime as needed for anxiety. 09/01/14   Colon Branch, MD  azelastine (ASTELIN) 0.1 % nasal spray Place 2 sprays into both nostrils at bedtime as needed for rhinitis. Use in each nostril as directed Patient not taking: Reported on 05/18/2015 11/27/14   Colon Branch, MD  calcium carbonate (OS-CAL) 1250 (500 CA) MG chewable tablet Chew 1 tablet by mouth daily.    Historical Provider, MD  Cholecalciferol (VITAMIN D3) 1000 UNITS CAPS Take 1,200 Units by mouth.    Historical Provider, MD  conjugated estrogens (PREMARIN) vaginal cream Place 1 Applicatorful vaginally 2 (two) times a week. 01/07/15   Lavonia Drafts, MD  HYDROcodone-acetaminophen (NORCO/VICODIN) 5-325 MG tablet Take 1 tablet by mouth every 6 (six) hours as needed for severe pain. 03/02/15   Colon Branch, MD  levothyroxine (SYNTHROID, LEVOTHROID) 88 MCG tablet Take 1 tablet (88 mcg total) by mouth daily before breakfast. 02/01/15   Colon Branch, MD  Multiple Vitamin (MULTIVITAMIN) capsule Take 1 capsule by mouth daily.      Historical Provider, MD  omeprazole (PRILOSEC OTC) 20 MG tablet Take 20 mg by mouth daily. Reported on 05/18/2015    Historical Provider, MD  polycarbophil (FIBERCON) 625 MG tablet Take 625 mg by mouth daily.      Historical Provider, MD  RESTASIS 0.05 % ophthalmic emulsion Place 1 drop into both eyes 2 (two) times daily.  01/03/11   Historical Provider, MD   BP 131/66 mmHg  Pulse 62  Temp(Src) 98.1 F (36.7 C) (Oral)  Resp 12  Ht 5\' 6"  (1.676 m)  Wt 76.204 kg  BMI 27.13 kg/m2  SpO2 100% Physical  Exam  Constitutional: She appears well-developed and well-nourished. No distress.  HENT:  Head: Normocephalic and atraumatic.  Mouth/Throat: Oropharynx is clear and moist.  Eyes: Conjunctivae and EOM are normal. Pupils are equal, round, and reactive to light. Right eye exhibits no discharge. Left eye exhibits no discharge. No scleral icterus.  Neck: Normal range of motion. Neck supple.  Cardiovascular: Normal rate, regular rhythm and normal heart sounds.   Pulmonary/Chest: Effort normal and breath sounds normal. No respiratory distress. She has no wheezes. She has no rales.  Abdominal: Soft. There is no tenderness.  Musculoskeletal: Normal range of motion.  Neurological: She is alert. Coordination normal.  Skin: Skin is warm and dry.  Psychiatric: She has a normal mood and affect. Her behavior is normal.  Nursing note and vitals reviewed.   ED Course  Procedures (including critical care time) Labs Review Labs Reviewed  COMPREHENSIVE METABOLIC PANEL - Abnormal; Notable for the following:    Glucose, Bld 116 (*)    Calcium 8.6 (*)    ALT 13 (*)    All other components within normal limits  CBC WITH DIFFERENTIAL/PLATELET  TROPONIN I  LIPASE, BLOOD  MAGNESIUM    Imaging Review Dg Chest 2 View  05/21/2015  CLINICAL DATA:  70 year old female with chest pain EXAM: CHEST  2 VIEW COMPARISON:  CT dated 09/11/2014 FINDINGS: The heart size and mediastinal contours are within normal limits. Both lungs are clear. The visualized skeletal structures are unremarkable. IMPRESSION: No active cardiopulmonary disease. Electronically Signed   By: Anner Crete M.D.   On: 05/21/2015 18:48   I have personally reviewed and evaluated these images and lab results as part of my medical decision-making.   EKG Interpretation   Date/Time:  Friday May 21 2015 18:16:55 EST Ventricular Rate:  79 PR Interval:  154 QRS Duration: 94 QT Interval:  400 QTC Calculation: 458 R Axis:   -14 Text  Interpretation:  Sinus rhythm with Premature atrial complexes  Otherwise normal ECG No significant change was found Confirmed by Wyvonnia Dusky   MD, STEPHEN [id number]) on 05/21/2015 6:19:45 PM      MDM   Final diagnoses:  Chest pain, unspecified chest pain type   70 year old female presenting with intermittent chest pain x 3 weeks. Associated symptoms include flushing, diaphoresis, lightheadedness and increased SOB. Last echo was 2005 to evaluate pleurisy which was negative. VSS. Pt is nontoxic appearing. Heart RRR. Lungs CTAB. Troponin negative with non-iscehmic ECG. CXR without abnormality. Heart score 5. Concern for unstable angina. Consulted hospitalist Dr. Olevia Bowens who will admit.   Case has been discussed with and seen by Dr. Wyvonnia Dusky who agrees with the above plan to discharge.      Josephina Gip, PA-C 05/21/15 2006  Ezequiel Essex, MD 05/21/15 2031

## 2015-05-21 NOTE — Telephone Encounter (Addendum)
Called pt. She states the pressure "just comes when it wants to." She has had 3 similar episodes, the first one being about 3 weeks ago and the most recent one being last night while watching TV. The pressure is centralized in her chest. She has associated lightheadedness. She has a home BP pressure cuff. During last night's episode, her BP was 130s/70s and pulse 60s-70s. The episodes have started happening "a little more often." Each episode lasts about 30 minutes. During the last episode she had associated "tingling" pain down her R arm. The pain then resolved on its own. She denies increased stress, recent surgeries, h/o blood clots, and any recent long distance travel.   Advised pt to proceed to ED for evaluation, she was reluctant. D/w Dr. Larose Kells, who agrees w/ evaluation. Pt again advised to proceed to ER and agreed to instructions but stated she would get there "maybe after dinner." Reiterated to pt that she should go to ED immediately for evaluation as we do not know the cause of her symptoms. She stated that she appreciated our concern and would go to ED, but would "get there when she gets there." Advised pt to keep her appt w/ Dr. Larose Kells on Monday for f/u.

## 2015-05-22 ENCOUNTER — Observation Stay (HOSPITAL_COMMUNITY): Payer: PPO

## 2015-05-22 DIAGNOSIS — R202 Paresthesia of skin: Secondary | ICD-10-CM

## 2015-05-22 DIAGNOSIS — R072 Precordial pain: Secondary | ICD-10-CM | POA: Diagnosis not present

## 2015-05-22 DIAGNOSIS — R079 Chest pain, unspecified: Secondary | ICD-10-CM

## 2015-05-22 DIAGNOSIS — K219 Gastro-esophageal reflux disease without esophagitis: Secondary | ICD-10-CM | POA: Diagnosis not present

## 2015-05-22 DIAGNOSIS — E039 Hypothyroidism, unspecified: Secondary | ICD-10-CM

## 2015-05-22 DIAGNOSIS — M797 Fibromyalgia: Secondary | ICD-10-CM

## 2015-05-22 DIAGNOSIS — F418 Other specified anxiety disorders: Secondary | ICD-10-CM | POA: Diagnosis present

## 2015-05-22 DIAGNOSIS — R200 Anesthesia of skin: Secondary | ICD-10-CM | POA: Diagnosis present

## 2015-05-22 LAB — NM MYOCAR MULTI W/SPECT W/WALL MOTION / EF
CHL CUP MPHR: 151 {beats}/min
CHL CUP RESTING HR STRESS: 69 {beats}/min
CSEPHR: 84 %
Estimated workload: 1 METS
Exercise duration (min): 0 min
Exercise duration (sec): 0 s
Peak HR: 127 {beats}/min

## 2015-05-22 LAB — LIPID PANEL
CHOL/HDL RATIO: 3 ratio
CHOLESTEROL: 145 mg/dL (ref 0–200)
HDL: 48 mg/dL (ref >40)
LDL CALC: 77 mg/dL (ref 0–99)
Triglycerides: 98 mg/dL (ref <150)
VLDL: 20 mg/dL (ref 0–40)

## 2015-05-22 LAB — RAPID URINE DRUG SCREEN, HOSP PERFORMED
AMPHETAMINES: NOT DETECTED
BENZODIAZEPINES: NOT DETECTED
Barbiturates: NOT DETECTED
Cocaine: NOT DETECTED
OPIATES: POSITIVE — AB
Tetrahydrocannabinol: NOT DETECTED

## 2015-05-22 LAB — PROTIME-INR
INR: 0.98 (ref 0.00–1.49)
Prothrombin Time: 13.2 seconds (ref 11.6–15.2)

## 2015-05-22 LAB — APTT: APTT: 37 s (ref 24–37)

## 2015-05-22 MED ORDER — CALCIUM CARBONATE 1250 (500 CA) MG PO CHEW: 1250.0000 mg | CHEWABLE_TABLET | Freq: Every day | ORAL | Status: DC

## 2015-05-22 MED ORDER — ASPIRIN 81 MG PO CHEW
324.0000 mg | CHEWABLE_TABLET | Freq: Every day | ORAL | Status: DC
Start: 2015-05-22 — End: 2015-05-22
  Administered 2015-05-22: 324 mg via ORAL
  Filled 2015-05-22: qty 4

## 2015-05-22 MED ORDER — HEPARIN SODIUM (PORCINE) 5000 UNIT/ML IJ SOLN
5000.0000 [IU] | Freq: Three times a day (TID) | INTRAMUSCULAR | Status: DC
  Administered 2015-05-22: 5000 [IU] via SUBCUTANEOUS
  Filled 2015-05-22 (×2): qty 1

## 2015-05-22 MED ORDER — PANTOPRAZOLE SODIUM 40 MG PO TBEC
40.0000 mg | DELAYED_RELEASE_TABLET | Freq: Every day | ORAL | Status: DC
  Administered 2015-05-22: 40 mg via ORAL
  Filled 2015-05-22: qty 1

## 2015-05-22 MED ORDER — NITROGLYCERIN 0.4 MG SL SUBL: 0.4000 mg | SUBLINGUAL_TABLET | SUBLINGUAL | Status: DC | PRN

## 2015-05-22 MED ORDER — VITAMIN D 1000 UNITS PO TABS
1000.0000 [IU] | ORAL_TABLET | Freq: Every day | ORAL | Status: DC
  Administered 2015-05-22: 1000 [IU] via ORAL
  Filled 2015-05-22: qty 1

## 2015-05-22 MED ORDER — REGADENOSON 0.4 MG/5ML IV SOLN
INTRAVENOUS | Status: AC
  Filled 2015-05-22: qty 5

## 2015-05-22 MED ORDER — ONDANSETRON HCL 4 MG/2ML IJ SOLN: 4.0000 mg | Freq: Four times a day (QID) | INTRAMUSCULAR | Status: DC | PRN

## 2015-05-22 MED ORDER — SODIUM CHLORIDE 0.9 % IV SOLN
INTRAVENOUS | Status: DC
  Administered 2015-05-22: 75 mL/h via INTRAVENOUS

## 2015-05-22 MED ORDER — OMEPRAZOLE MAGNESIUM 20 MG PO TBEC: 20.0000 mg | DELAYED_RELEASE_TABLET | Freq: Every day | ORAL | Status: DC

## 2015-05-22 MED ORDER — TECHNETIUM TC 99M SESTAMIBI GENERIC - CARDIOLITE
10.0000 | Freq: Once | INTRAVENOUS | Status: AC | PRN
  Administered 2015-05-22: 10 via INTRAVENOUS

## 2015-05-22 MED ORDER — ALPRAZOLAM 0.5 MG PO TABS: 0.5000 mg | ORAL_TABLET | Freq: Every evening | ORAL | Status: DC | PRN

## 2015-05-22 MED ORDER — CALCIUM CARBONATE 1250 (500 CA) MG PO TABS
1.0000 | ORAL_TABLET | Freq: Every day | ORAL | Status: DC
  Administered 2015-05-22: 500 mg via ORAL
  Filled 2015-05-22: qty 1

## 2015-05-22 MED ORDER — CYCLOSPORINE 0.05 % OP EMUL
1.0000 [drp] | Freq: Two times a day (BID) | OPHTHALMIC | Status: DC
  Administered 2015-05-22: 1 [drp] via OPHTHALMIC
  Filled 2015-05-22 (×2): qty 1

## 2015-05-22 MED ORDER — ASPIRIN 81 MG PO CHEW: 81.0000 mg | CHEWABLE_TABLET | Freq: Every day | ORAL | Status: DC

## 2015-05-22 MED ORDER — MULTIVITAMINS PO CAPS: 1.0000 | ORAL_CAPSULE | Freq: Every day | ORAL | Status: DC

## 2015-05-22 MED ORDER — ESTROGENS, CONJUGATED 0.625 MG/GM VA CREA
1.0000 | TOPICAL_CREAM | VAGINAL | Status: DC
  Filled 2015-05-22: qty 30

## 2015-05-22 MED ORDER — REGADENOSON 0.4 MG/5ML IV SOLN
0.4000 mg | Freq: Once | INTRAVENOUS | Status: AC
  Administered 2015-05-22: 0.4 mg via INTRAVENOUS
  Filled 2015-05-22: qty 5

## 2015-05-22 MED ORDER — HYDROCODONE-ACETAMINOPHEN 5-325 MG PO TABS: 1.0000 | ORAL_TABLET | Freq: Four times a day (QID) | ORAL | Status: DC | PRN

## 2015-05-22 MED ORDER — ACETAMINOPHEN 325 MG PO TABS: 650.0000 mg | ORAL_TABLET | ORAL | Status: DC | PRN

## 2015-05-22 MED ORDER — ADULT MULTIVITAMIN W/MINERALS CH
1.0000 | ORAL_TABLET | Freq: Every day | ORAL | Status: DC
  Administered 2015-05-22: 1 via ORAL
  Filled 2015-05-22: qty 1

## 2015-05-22 MED ORDER — LEVOTHYROXINE SODIUM 88 MCG PO TABS
88.0000 ug | ORAL_TABLET | Freq: Every day | ORAL | Status: DC
  Administered 2015-05-22: 88 ug via ORAL
  Filled 2015-05-22: qty 1

## 2015-05-22 MED ORDER — TECHNETIUM TC 99M SESTAMIBI GENERIC - CARDIOLITE
30.0000 | Freq: Once | INTRAVENOUS | Status: AC | PRN
  Administered 2015-05-22: 30 via INTRAVENOUS

## 2015-05-22 MED ORDER — MORPHINE SULFATE (PF) 2 MG/ML IV SOLN: 2.0000 mg | INTRAVENOUS | Status: DC | PRN

## 2015-05-22 MED ORDER — CALCIUM POLYCARBOPHIL 625 MG PO TABS
625.0000 mg | ORAL_TABLET | Freq: Every day | ORAL | Status: DC
  Administered 2015-05-22: 625 mg via ORAL
  Filled 2015-05-22: qty 1

## 2015-05-22 MED ORDER — ZOLPIDEM TARTRATE 5 MG PO TABS
5.0000 mg | ORAL_TABLET | Freq: Every evening | ORAL | Status: DC | PRN
  Administered 2015-05-22: 5 mg via ORAL
  Filled 2015-05-22: qty 1

## 2015-05-22 NOTE — Care Management Obs Status (Signed)
[place] NOTIFICATION   Patient Details  Name: [name] MRN: [id number] Date of Birth: 11/01/1945   Medicare Observation Status Notification Given:  Yes  MOON and C44 given; original to CM file.  Dellie Catholic, RN 05/22/2015, 3:43 PM

## 2015-05-22 NOTE — Evaluation (Signed)
Physical Therapy Evaluation Patient Details Name: [name] MRN: [id number] DOB: [birth year] Today's Date: 05/22/2015   History of Present Illness  pt presents with Chest Pain.  pt with hx of Depression, Anxiety, Hypothyroidism, and Fibromyalgia.    Clinical Impression  Pt moving at baseline.  Pt indicates having an Outpatient PT appointment scheduled for next week, and feel this is appropriate for pt to keep the appointment for her back.  No further acute PT needs at this time.  Will sign off.      Follow Up Recommendations Outpatient PT    Equipment Recommendations  None recommended by PT    Recommendations for Other Services       Precautions / Restrictions Precautions Precautions: None Restrictions Weight Bearing Restrictions: No      Mobility  Bed Mobility Overal bed mobility: Independent                Transfers Overall transfer level: Independent Equipment used: None                Ambulation/Gait Ambulation/Gait assistance: Modified independent (Device/Increase time) Ambulation Distance (Feet): 250 Feet Assistive device: None Gait Pattern/deviations: Antalgic     General Gait Details: pt antalgic on R LE and indicates she tore a muscle recently and has been going to her Orthopedic MD for this.  pt indicates she is moving at baseline level.  No LOB or any gait deviations other than antalgia.    Stairs            Wheelchair Mobility    Modified Rankin (Stroke Patients Only)       Balance Overall balance assessment: No apparent balance deficits (not formally assessed)                                           Pertinent Vitals/Pain Pain Assessment: No/denies pain    Home Living Family/patient expects to be discharged to:: Private residence Living Arrangements: Spouse/significant other Available Help at Discharge: Family;Available 24 hours/day Type of Home: House Home Access: Stairs to enter Entrance  Stairs-Rails: None Entrance Stairs-Number of Steps: 1 Home Layout: Two level;Able to live on main level with bedroom/bathroom Home Equipment: None      Prior Function Level of Independence: Independent               Hand Dominance        Extremity/Trunk Assessment   Upper Extremity Assessment: Overall WFL for tasks assessed           Lower Extremity Assessment: Overall WFL for tasks assessed      Cervical / Trunk Assessment: Normal  Communication   Communication: No difficulties  Cognition Arousal/Alertness: Awake/alert Behavior During Therapy: WFL for tasks assessed/performed Overall Cognitive Status: Within Functional Limits for tasks assessed                      General Comments      Exercises        Assessment/Plan    PT Assessment Patent does not need any further PT services  PT Diagnosis Difficulty walking   PT Problem List    PT Treatment Interventions     PT Goals (Current goals can be found in the Care Plan section) Acute Rehab PT Goals Patient Stated Goal: Home today PT Goal Formulation: All assessment and education complete, DC therapy    Frequency  Barriers to discharge        Co-evaluation               End of Session   Activity Tolerance: Patient tolerated treatment well Patient left: in bed;with call bell/phone within reach Nurse Communication: Mobility status    Functional Assessment Tool Used: Clinical Judgement Functional Limitation: Mobility: Walking and moving around Mobility: Walking and Moving Around Current Status [id number]): 0 percent impaired, limited or restricted Mobility: Walking and Moving Around Goal Status [phone]): 0 percent impaired, limited or restricted Mobility: Walking and Moving Around Discharge Status [phone]): 0 percent impaired, limited or restricted    Time: 0955-1003 PT Time Calculation (min) (ACUTE ONLY): 8 min   Charges:   PT Evaluation $PT Eval Low Complexity: 1 Procedure      PT G Codes:   PT G-Codes **NOT FOR INPATIENT CLASS** Functional Assessment Tool Used: Clinical Judgement Functional Limitation: Mobility: Walking and moving around Mobility: Walking and Moving Around Current Status [id number]): 0 percent impaired, limited or restricted Mobility: Walking and Moving Around Goal Status [id number]): 0 percent impaired, limited or restricted Mobility: Walking and Moving Around Discharge Status [id number]): 0 percent impaired, limited or restricted    Catarina Hartshorn, [place] [phone] 05/22/2015, 10:14 AM

## 2015-05-22 NOTE — H&P (Signed)
[facility] History and Physical  [name] [id number] DOB: 1945-05-16 DOA: 05/21/2015  Referring physician: ED physician PCP: Kathlene November, MD  Specialists:   Chief Complaint: Chest pain  HPI: [name] is a 70 y.o. female with PMH of GERD, hypothyroidism, depression, anxiety, migraine headaches, fibromyalgia, osteopenia, pleurisy, who presents with chest pain and the right.  Patient reports that she had 3 episode of chest pain in the past 3 weeks. It happened weekly. Last episode was yesterday. The chest pain was located in the substernal area, 5 out of 10 in severity, pressure-like feeling, nonradiating. It is associated with hot flashes, lightheadedness. She also had one episode of right arm tingling and numbness, which lasted only for few seconds. Patient did not have vision change, hearing loss, unilateral weakness. Patient does not have cough, shortness of breath, abdominal pain, diarrhea, symptoms of  UTI. Currently patient is chest pain-free.  In ED, patient was found to have negative troponin, WBC 7.7, temperature normal, no tachycardia, electrolytes and renal function okay, chest x-ray is negative for acute abnormalities. Patient admitted to inpatient for further intervention and treatment.  EKG: Independently reviewed. QTC 455, early R-wave progression, low voltage, borderline LAD, occasional PAC.   Where does patient live?   At home   Can patient participate in ADLs?  Yes   Review of Systems:   General: no fevers, chills, no changes in body weight, has fatigue HEENT: no blurry vision, hearing changes or sore throat Pulm: no dyspnea, coughing, wheezing CV: has chest pain, no palpitations Abd: no nausea, vomiting, abdominal pain, diarrhea, constipation GU: no dysuria, burning on urination, increased urinary frequency, hematuria  Ext: no leg edema Neuro: no unilateral weakness, had right arm numbness and tingling, no vision change or hearing loss Skin: no  rash MSK: No muscle spasm, no deformity, no limitation of range of movement in spin Heme: No easy bruising.  Travel history: No recent long distant travel.  Allergy:  Allergies  Allergen Reactions  . Alendronate Sodium     REACTION: urticaria (hives)  . Bactrim [Sulfamethoxazole-Trimethoprim]     Upset stomach  . Milnacipran     REACTION: increased bp, increased hr  . Penicillins     REACTION: urticaria (hives)    Past Medical History  Diagnosis Date  . Hypothyroidism   . Migraine   . Osteopenia   . Colonic polyp     bx adenomatous polyps, next scope 2010  . Pleuritic chest pain     etiology unclear, has beeb eval by pulmonary and rheumatology (autoimmune dz?)  . Depression   . Chest pain     stress test @ [facility] cardiology (-)  . Fibromyalgia     see rheumatology  . 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 Glaucoma     Past Surgical History  Procedure Laterality Date  . Tonsillectomy    . Right rotator cuff repair  2010  . 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  . Esophagogastroduodenoscopy (egd) with propofol N/A 03/27/2013    Procedure: ESOPHAGOGASTRODUODENOSCOPY (EGD) WITH PROPOFOL;  Surgeon: Milus Banister, MD;  Location: [facility] ENDOSCOPY;  Service: Endoscopy;  Laterality: N/A;  possible dil  . Plastic surgery face  2013    Social History:  reports that she quit smoking about 11 years ago. Her smoking use included Cigarettes. She has a 38.5 pack-year smoking history. She has never used smokeless tobacco. She reports that she drinks alcohol. She reports that she  does not use illicit drugs.  Family History:  Family History  Problem Relation Age of Onset  . Adopted: Yes  . Colon cancer Neg Hx   . Cancer Neg Hx   . Depression Neg Hx   . Diabetes Neg Hx   . Stroke Neg Hx   . Hypertension Neg Hx   . Heart disease Neg Hx      Prior to Admission medications   Medication Sig Start Date End Date Taking? Authorizing Provider  ALPRAZolam Duanne Moron) 0.5 MG tablet Take  1 tablet (0.5 mg total) by mouth at bedtime as needed for anxiety. 09/01/14   Colon Branch, MD  azelastine (ASTELIN) 0.1 % nasal spray Place 2 sprays into both nostrils at bedtime as needed for rhinitis. Use in each nostril as directed Patient not taking: Reported on 05/18/2015 11/27/14   Colon Branch, MD  calcium carbonate (OS-CAL) 1250 (500 CA) MG chewable tablet Chew 1 tablet by mouth daily.    Historical Provider, MD  Cholecalciferol (VITAMIN D3) 1000 UNITS CAPS Take 1,200 Units by mouth.    Historical Provider, MD  conjugated estrogens (PREMARIN) vaginal cream Place 1 Applicatorful vaginally 2 (two) times a week. 01/07/15   Lavonia Drafts, MD  HYDROcodone-acetaminophen (NORCO/VICODIN) 5-325 MG tablet Take 1 tablet by mouth every 6 (six) hours as needed for severe pain. 03/02/15   Colon Branch, MD  levothyroxine (SYNTHROID, LEVOTHROID) 88 MCG tablet Take 1 tablet (88 mcg total) by mouth daily before breakfast. 02/01/15   Colon Branch, MD  Multiple Vitamin (MULTIVITAMIN) capsule Take 1 capsule by mouth daily.      Historical Provider, MD  omeprazole (PRILOSEC OTC) 20 MG tablet Take 20 mg by mouth daily. Reported on 05/18/2015    Historical Provider, MD  polycarbophil (FIBERCON) 625 MG tablet Take 625 mg by mouth daily.      Historical Provider, MD  RESTASIS 0.05 % ophthalmic emulsion Place 1 drop into both eyes 2 (two) times daily.  01/03/11   Historical Provider, MD    Physical Exam: Filed Vitals:   05/21/15 2130 05/21/15 2200 05/21/15 2202 05/21/15 2345  BP: 131/72 141/80  133/70  Pulse: 71 71  72  Temp:   97.6 F (36.4 C) 97.9 F (36.6 C)  TempSrc:    Oral  Resp: 14 20  18   Height:    5\' 6"  (1.676 m)  Weight:    77.52 kg (170 lb 14.4 oz)  SpO2: 100% 100%  99%   General: Not in acute distress HEENT:       Eyes: PERRL, EOMI, no scleral icterus.       ENT: No discharge from the ears and nose, no pharynx injection, no tonsillar enlargement.        Neck: No JVD, no bruit, no mass  felt. Heme: No neck lymph node enlargement. Cardiac: S1/S2, RRR, No murmurs, No gallops or rubs. Pulm: Good air movement bilaterally. No rales, wheezing, rhonchi or rubs. Abd: Soft, nondistended, nontender, no rebound pain, no organomegaly, BS present. Ext: No pitting leg edema bilaterally. 2+DP/PT pulse bilaterally. Musculoskeletal: No joint deformities, No joint redness or warmth, no limitation of ROM in spin. Skin: No rashes.  Neuro: Alert, oriented X3, cranial nerves II-XII grossly intact, muscle strength 5/5 in all extremities, sensation to light touch intact. Knee reflex 1+ bilaterally. Negative Babinski's sign. Normal finger to nose test. Psych: Patient is not psychotic, no suicidal or hemocidal ideation.  Labs on Admission:  Basic Metabolic Panel:  Recent Labs  Lab 05/21/15 1850  NA 137  K 3.7  CL 103  CO2 27  GLUCOSE 116*  BUN 11  CREATININE 0.74  CALCIUM 8.6*  MG 2.0   Liver Function Tests:  Recent Labs Lab 05/21/15 1850  AST 17  ALT 13*  ALKPHOS 65  BILITOT 0.4  PROT 6.8  ALBUMIN 3.8    Recent Labs Lab 05/21/15 1850  LIPASE 27   No results for input(s): AMMONIA in the last 168 hours. CBC:  Recent Labs Lab 05/21/15 1850  WBC 7.7  NEUTROABS 4.5  HGB 12.5  HCT 39.3  MCV 94.7  PLT 245   Cardiac Enzymes:  Recent Labs Lab 05/21/15 1850  TROPONINI <0.03    BNP (last 3 results) No results for input(s): BNP in the last 8760 hours.  ProBNP (last 3 results) No results for input(s): PROBNP in the last 8760 hours.  CBG: No results for input(s): GLUCAP in the last 168 hours.  Radiological Exams on Admission: Dg Chest 2 View  05/21/2015  CLINICAL DATA:  71 year old female with chest pain EXAM: CHEST  2 VIEW COMPARISON:  CT dated 09/11/2014 FINDINGS: The heart size and mediastinal contours are within normal limits. Both lungs are clear. The visualized skeletal structures are unremarkable. IMPRESSION: No active cardiopulmonary disease.  Electronically Signed   By: Anner Crete M.D.   On: 05/21/2015 18:48    Assessment/Plan Principal Problem:   Chest pain Active Problems:   Hypothyroidism   Migraine without aura   Fibromyalgia, pain mngmt   Osteoporosis   GERD (gastroesophageal reflux disease)   Depression with anxiety   Numbness and tingling of right arm   Pain in the chest   Chest pain: Patient has atypical chest pain, with low risk for ACS. No pneumonia by chest x-ray. No shortness of breath or signs of DVT, less likely to have pulmonary embolism. Currently chest pain-free. We'll admit for chest pain rule out. - will admit to Tele bed for observation - cycle CE q6 x3 and repeat her EKG in the am  - prn Nitroglycerin, Morphine, and aspirin - Risk factor stratification: will check FLP and A1C  - 2d echo - Consider cardiology consult if test positive for CEs   Hypothyroidism: Last TSH was 0.81 on 09/01/14 -Continue home Synthroid -Check TSH  Depression and anxiety: Stable, no suicidal or homicidal ideations. -Continue home medications: Ativan  GERD: -Protonix  Numbness and tingling of right arm: Patient had episode of right arm numbness and tingling, which lasted only for a few seconds. No focal neurologic findings. Very unlikely to have TIA or stroke. -Observe closely -Hold off brain image.   DVT ppx: SQ Heparin (if pt develops severe chest pain or significantly elevated trop, will be easier to switch to IV heparin or stop heparin for procedure than using Lovenox).   Code Status: Full code Family Communication: None at bed side.    Disposition Plan: Admit to inpatient   Date of Service 05/22/2015    Ivor Costa [facility] Pager [phone]  If 7PM-7AM, please contact night-coverage [email or web address] Password [email or web address] 05/22/2015, 3:22 AM

## 2015-05-22 NOTE — Discharge Instructions (Signed)
Heart-Healthy Eating Plan °Many factors influence your heart health, including eating and exercise habits. Heart (coronary) risk increases with abnormal blood fat (lipid) levels. Heart-healthy meal planning includes limiting unhealthy fats, increasing healthy fats, and making other small dietary changes. This includes maintaining a healthy body weight to help keep lipid levels within a normal range. °WHAT IS MY PLAN?  °Your health care provider recommends that you: °· Get no more than _________% of the total calories in your daily diet from fat. °· Limit your intake of saturated fat to less than _________% of your total calories each day. °· Limit the amount of cholesterol in your diet to less than _________ mg per day. °WHAT TYPES OF FAT SHOULD I CHOOSE? °· Choose healthy fats more often. Choose monounsaturated and polyunsaturated fats, such as olive oil and canola oil, flaxseeds, walnuts, almonds, and seeds. °· Eat more omega-3 fats. Good choices include salmon, mackerel, sardines, tuna, flaxseed oil, and ground flaxseeds. Aim to eat fish at least two times each week. °· Limit saturated fats. Saturated fats are primarily found in animal products, such as meats, butter, and cream. Plant sources of saturated fats include palm oil, palm kernel oil, and coconut oil. °· Avoid foods with partially hydrogenated oils in them. These contain trans fats. Examples of foods that contain trans fats are stick margarine, some tub margarines, cookies, crackers, and other baked goods. °WHAT GENERAL GUIDELINES DO I NEED TO FOLLOW? °· Check food labels carefully to identify foods with trans fats or high amounts of saturated fat. °· Fill one half of your plate with vegetables and green salads. Eat 4-5 servings of vegetables per day. A serving of vegetables equals 1 cup of raw leafy vegetables, ½ cup of raw or cooked cut-up vegetables, or ½ cup of vegetable juice. °· Fill one fourth of your plate with whole grains. Look for the word  "whole" as the first word in the ingredient list. °· Fill one fourth of your plate with lean protein foods. °· Eat 4-5 servings of fruit per day. A serving of fruit equals one medium whole fruit, ¼ cup of dried fruit, ½ cup of fresh, frozen, or canned fruit, or ½ cup of 100% fruit juice. °· Eat more foods that contain soluble fiber. Examples of foods that contain this type of fiber are apples, broccoli, carrots, beans, peas, and barley. Aim to get 20-30 g of fiber per day. °· Eat more home-cooked food and less restaurant, buffet, and fast food. °· Limit or avoid alcohol. °· Limit foods that are high in starch and sugar. °· Avoid fried foods. °· Cook foods by using methods other than frying. Baking, boiling, grilling, and broiling are all great options. Other fat-reducing suggestions include: °¨ Removing the skin from poultry. °¨ Removing all visible fats from meats. °¨ Skimming the fat off of stews, soups, and gravies before serving them. °¨ Steaming vegetables in water or broth. °· Lose weight if you are overweight. Losing just 5-10% of your initial body weight can help your overall health and prevent diseases such as diabetes and heart disease. °· Increase your consumption of nuts, legumes, and seeds to 4-5 servings per week. One serving of dried beans or legumes equals ½ cup after being cooked, one serving of nuts equals 1½ ounces, and one serving of seeds equals ½ ounce or 1 tablespoon. °· You may need to monitor your salt (sodium) intake, especially if you have high blood pressure. Talk with your health care provider or dietitian to get   more information about reducing sodium. °WHAT FOODS CAN I EAT? °Grains °Breads, including French, white, pita, wheat, raisin, rye, oatmeal, and Italian. Tortillas that are neither fried nor made with lard or trans fat. Low-fat rolls, including hotdog and hamburger buns and English muffins. Biscuits. Muffins. Waffles. Pancakes. Light popcorn. Whole-grain cereals. Flatbread. Melba  toast. Pretzels. Breadsticks. Rusks. Low-fat snacks and crackers, including oyster, saltine, matzo, graham, animal, and rye. Rice and pasta, including brown rice and those that are made with whole wheat. °Vegetables °All vegetables. °Fruits °All fruits, but limit coconut. °Meats and Other Protein Sources °Lean, well-trimmed beef, veal, pork, and lamb. Chicken and turkey without skin. All fish and shellfish. Wild duck, rabbit, pheasant, and venison. Egg whites or low-cholesterol egg substitutes. Dried beans, peas, lentils, and tofu. Seeds and most nuts. °Dairy °Low-fat or nonfat cheeses, including ricotta, string, and mozzarella. Skim or 1% milk that is liquid, powdered, or evaporated. Buttermilk that is made with low-fat milk. Nonfat or low-fat yogurt. °Beverages °Mineral water. Diet carbonated beverages. °Sweets and Desserts °Sherbets and fruit ices. Honey, jam, marmalade, jelly, and syrups. Meringues and gelatins. Pure sugar candy, such as hard candy, jelly beans, gumdrops, mints, marshmallows, and small amounts of dark chocolate. Angel food cake. °Eat all sweets and desserts in moderation. °Fats and Oils °Nonhydrogenated (trans-free) margarines. Vegetable oils, including soybean, sesame, sunflower, olive, peanut, safflower, corn, canola, and cottonseed. Salad dressings or mayonnaise that are made with a vegetable oil. Limit added fats and oils that you use for cooking, baking, salads, and as spreads. °Other °Cocoa powder. Coffee and tea. All seasonings and condiments. °The items listed above may not be a complete list of recommended foods or beverages. Contact your dietitian for more options. °WHAT FOODS ARE NOT RECOMMENDED? °Grains °Breads that are made with saturated or trans fats, oils, or whole milk. Croissants. Butter rolls. Cheese breads. Sweet rolls. Donuts. Buttered popcorn. Chow mein noodles. High-fat crackers, such as cheese or butter crackers. °Meats and Other Protein Sources °Fatty meats, such as  hotdogs, short ribs, sausage, spareribs, bacon, ribeye roast or steak, and mutton. High-fat deli meats, such as salami and bologna. Caviar. Domestic duck and goose. Organ meats, such as kidney, liver, sweetbreads, brains, gizzard, chitterlings, and heart. °Dairy °Cream, sour cream, cream cheese, and creamed cottage cheese. Whole milk cheeses, including blue (bleu), Monterey Jack, Brie, Col[place], American, Havarti, Swiss, cheddar, Camembert, and [place].  Whole or 2% milk that is liquid, evaporated, or condensed. Whole buttermilk. Cream sauce or high-fat cheese sauce. Yogurt that is made from whole milk. °Beverages °Regular sodas and drinks with added sugar. °Sweets and Desserts °Frosting. Pudding. Cookies. Cakes other than angel food cake. Candy that has milk chocolate or white chocolate, hydrogenated fat, butter, coconut, or unknown ingredients. Buttered syrups. Full-fat ice cream or ice cream drinks. °Fats and Oils °Gravy that has suet, meat fat, or shortening. Cocoa butter, hydrogenated oils, palm oil, coconut oil, palm kernel oil. These can often be found in baked products, candy, fried foods, nondairy creamers, and whipped toppings. Solid fats and shortenings, including bacon fat, salt pork, lard, and butter. Nondairy cream substitutes, such as coffee creamers and sour cream substitutes. Salad dressings that are made of unknown oils, cheese, or sour cream. °The items listed above may not be a complete list of foods and beverages to avoid. Contact your dietitian for more information. °  °This information is not intended to replace advice given to you by your health care provider. Make sure you discuss any questions you have with your health   care provider. °  °Document Released: 01/04/2008 Document Revised: 04/17/2014 Document Reviewed: 09/18/2013 °[employer]vier Interactive Patient Education ©2016 [employer] ° °

## 2015-05-22 NOTE — Discharge Summary (Signed)
PATIENT DETAILS Name: [name] Age: 70 y.o. Sex: female Date of Birth: July 02, 1945 MRN: [id number]. Admitting Physician: Ivor Costa, MD [id number] Larose Kells, MD  Admit Date: 05/21/2015 Discharge date: 05/22/2015  Recommendations for Outpatient Follow-up:  1. Please repeat CBC/BMET at next visit 2. New Med:ASA 81 mg daily  PRIMARY DISCHARGE DIAGNOSIS:  Principal Problem:   Chest pain Active Problems:   Hypothyroidism   Migraine without aura   Fibromyalgia, pain mngmt   Osteoporosis   GERD (gastroesophageal reflux disease)   Depression with anxiety   Numbness and tingling of right arm   Pain in the chest      PAST MEDICAL HISTORY: Past Medical History  Diagnosis Date  . Hypothyroidism   . Migraine   . Osteopenia   . Colonic polyp     bx adenomatous polyps, next scope 2010  . Pleuritic chest pain     etiology unclear, has beeb eval by pulmonary and rheumatology (autoimmune dz?)  . Depression   . Chest pain     stress test @ [facility] cardiology (-)  . Fibromyalgia     see rheumatology  . 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 Glaucoma     DISCHARGE MEDICATIONS: Current Discharge Medication List    START taking these medications   Details  aspirin 81 MG chewable tablet Chew 1 tablet (81 mg total) by mouth daily.      CONTINUE these medications which have NOT CHANGED   Details  ALPRAZolam (XANAX) 0.5 MG tablet Take 1 tablet (0.5 mg total) by mouth at bedtime as needed for anxiety. Qty: 30 tablet, Refills: 2    azelastine (ASTELIN) 0.1 % nasal spray Place 2 sprays into both nostrils at bedtime as needed for rhinitis. Use in each nostril as directed Qty: 30 mL, Refills: 3    calcium carbonate (OS-CAL) 1250 (500 CA) MG chewable tablet Chew 1 tablet by mouth daily.    Cholecalciferol (VITAMIN D3) 1000 UNITS CAPS Take 1,200 Units by mouth.    conjugated estrogens (PREMARIN) vaginal cream Place 1 Applicatorful vaginally 2 (two) times a week. Qty: 42.5 g, Refills:  12    HYDROcodone-acetaminophen (NORCO/VICODIN) 5-325 MG tablet Take 1 tablet by mouth every 6 (six) hours as needed for severe pain. Qty: 300 tablet, Refills: 0    levothyroxine (SYNTHROID, LEVOTHROID) 88 MCG tablet Take 1 tablet (88 mcg total) by mouth daily before breakfast. Qty: 90 tablet, Refills: 2    Multiple Vitamin (MULTIVITAMIN) capsule Take 1 capsule by mouth daily.      omeprazole (PRILOSEC OTC) 20 MG tablet Take 20 mg by mouth daily. Reported on 05/18/2015    polycarbophil (FIBERCON) 625 MG tablet Take 625 mg by mouth daily.      RESTASIS 0.05 % ophthalmic emulsion Place 1 drop into both eyes 2 (two) times daily.         ALLERGIES:   Allergies  Allergen Reactions  . Alendronate Sodium     REACTION: urticaria (hives)  . Bactrim [Sulfamethoxazole-Trimethoprim]     Upset stomach  . Milnacipran     REACTION: increased bp, increased hr  . Penicillins     REACTION: urticaria (hives)    BRIEF HPI:  See H&P, Labs, Consult and Test reports for all details in brief, patient was admitted for evaluation of chest pain  CONSULTATIONS:   cardiology  PERTINENT RADIOLOGIC STUDIES: Dg Chest 2 View  05/21/2015  CLINICAL DATA:  70 year old female with chest pain EXAM: CHEST  2 VIEW  COMPARISON:  CT dated 09/11/2014 FINDINGS: The heart size and mediastinal contours are within normal limits. Both lungs are clear. The visualized skeletal structures are unremarkable. IMPRESSION: No active cardiopulmonary disease. Electronically Signed   By: Anner Crete M.D.   On: 05/21/2015 18:48   Nm Myocar Multi W/spect W/wall Motion / Ef  05/22/2015   T wave inversion was noted during stress in the II, III, aVL, V5 and V6 leads, and returning to baseline after less than 1 min of recovery. T wave flattening in inferolateral leads with tachycardia after lexiscan injection, quickly resolved  Defect 1: There is a small defect of mild severity present in the basal inferior and mid inferior  location.  This is a low risk study.  Nuclear stress EF: 72%.      PERTINENT LAB RESULTS: CBC:  Recent Labs  05/21/15 1850  WBC 7.7  HGB 12.5  HCT 39.3  PLT 245   CMET CMP     Component Value Date/Time   NA 137 05/21/2015 1850   K 3.7 05/21/2015 1850   CL 103 05/21/2015 1850   CO2 27 05/21/2015 1850   GLUCOSE 116* 05/21/2015 1850   GLUCOSE 129 10/12/2009 0000   BUN 11 05/21/2015 1850   CREATININE 0.74 05/21/2015 1850   CALCIUM 8.6* 05/21/2015 1850   PROT 6.8 05/21/2015 1850   ALBUMIN 3.8 05/21/2015 1850   AST 17 05/21/2015 1850   ALT 13* 05/21/2015 1850   ALKPHOS 65 05/21/2015 1850   BILITOT 0.4 05/21/2015 1850   GFRNONAA >60 05/21/2015 1850   GFRAA >60 05/21/2015 1850    GFR Estimated Creatinine Clearance: 69.5 mL/min (by C-G formula based on Cr of 0.74).  Recent Labs  05/21/15 1850  LIPASE 27    Recent Labs  05/21/15 1850  TROPONINI <0.03   Invalid input(s): POCBNP No results for input(s): DDIMER in the last 72 hours. No results for input(s): HGBA1C in the last 72 hours.  Recent Labs  05/22/15 0537  CHOL 145  HDL 48  LDLCALC 77  TRIG 98  CHOLHDL 3.0   No results for input(s): TSH, T4TOTAL, T3FREE, THYROIDAB in the last 72 hours.  Invalid input(s): FREET3 No results for input(s): VITAMINB12, FOLATE, FERRITIN, TIBC, IRON, RETICCTPCT in the last 72 hours. Coags:  Recent Labs  05/22/15 0116  INR 0.98   Microbiology: No results found for this or any previous visit (from the past 240 hour(s)).   BRIEF HOSPITAL COURSE:   Principal Problem:   Chest pain:no further chest pain since admission, EKG/Enzymes negative. Seen by cards-underwent Nuc Stress Test-which is a low risk study. Had spoken with Dr Louretta Parma had indicated that if Nuc Stress was negative-ok to d/c.   Active Problems:   Hypothyroidism: Continue Levothyroxine.     GERD:Continue PPI    Anxiety/Depression/Fibromyalgia:stable-continue usual  medications  TODAY-DAY OF DISCHARGE:  Subjective:   Travis Wotring today has no headache,no chest abdominal pain,no new weakness tingling or numbness, feels much better wants to go home today.   Objective:   Blood pressure 129/72, pulse 79, temperature 98.3 F (36.8 C), temperature source Oral, resp. rate 20, height 5\' 6"  (1.676 m), weight 76.658 kg (169 lb), SpO2 99 %.  Intake/Output Summary (Last 24 hours) at 05/22/15 1535 Last data filed at 05/22/15 1300  Gross per 24 hour  Intake    100 ml  Output    850 ml  Net   -750 ml   Filed Weights   05/21/15 1812 05/21/15 2345 05/22/15  0500  Weight: 76.204 kg (168 lb) 77.52 kg (170 lb 14.4 oz) 76.658 kg (169 lb)    Exam Awake Alert, Oriented *3, No new F.N deficits, Normal affect [place].AT,PERRAL Supple Neck,No JVD, No cervical lymphadenopathy appriciated.  Symmetrical Chest wall movement, Good air movement bilaterally, CTAB RRR,No Gallops,Rubs or new Murmurs, No Parasternal Heave +ve B.Sounds, Abd Soft, Non tender, No organomegaly appriciated, No rebound -guarding or rigidity. No Cyanosis, Clubbing or edema, No new Rash or bruise  DISCHARGE CONDITION: Stable  DISPOSITION: Home  DISCHARGE INSTRUCTIONS:    Activity:  As tolerated with Full fall precautions use walker/cane & assistance as needed  Get Medicines reviewed and adjusted: Please take all your medications with you for your next visit with your Primary MD  Please request your Primary MD to go over all hospital tests and procedure/radiological results at the follow up, please ask your Primary MD to get all Hospital records sent to his/her office.  If you experience worsening of your admission symptoms, develop shortness of breath, life threatening emergency, suicidal or homicidal thoughts you must seek medical attention immediately by calling 911 or calling your MD immediately  if symptoms less severe.  You must read complete instructions/literature along with all the  possible adverse reactions/side effects for all the Medicines you take and that have been prescribed to you. Take any new Medicines after you have completely understood and accpet all the possible adverse reactions/side effects.   Do not drive when taking Pain medications.   Do not take more than prescribed Pain, Sleep and Anxiety Medications  Special Instructions: If you have smoked or chewed Tobacco  in the last 2 yrs please stop smoking, stop any regular Alcohol  and or any Recreational drug use.  Wear Seat belts while driving.  Please note  You were cared for by a hospitalist during your hospital stay. Once you are discharged, your primary care physician will handle any further medical issues. Please note that NO REFILLS for any discharge medications will be authorized once you are discharged, as it is imperative that you return to your primary care physician (or establish a relationship with a primary care physician if you do not have one) for your aftercare needs so that they can reassess your need for medications and monitor your lab values.   Diet recommendation: Heart Healthy diet  Discharge Instructions    Call MD for:  persistant nausea and vomiting    Complete by:  As directed      Call MD for:  severe uncontrolled pain    Complete by:  As directed      Diet - low sodium heart healthy    Complete by:  As directed      Increase activity slowly    Complete by:  As directed            Follow-up Information    Follow up with Kathlene November, MD. Schedule an appointment as soon as possible for a visit in 1 week.   Specialty:  Internal Medicine   Contact information:   [address] [place] [address] [phone]       Total Time spent on discharge equals 25 minutes.  SignedOren Binet 05/22/2015 3:35 PM

## 2015-05-22 NOTE — Consult Note (Signed)
CARDIOLOGY CONSULT NOTE   Patient ID: [name] MRN: [id number], DOB/AGE: 05/14/1945   Admit date: 05/21/2015 Date of Consult: 05/22/2015   Primary Physician: Kathlene November, MD Primary Cardiologist: Dr. Sallyanne Kuster  Pt. Profile  [name] is a pleasant 70 year old Caucasian female with past medical history of fibromyalgia and hypothyroidism, however no past cardiac history who presented with 3 episodes of chest pain occuring at rest in the last 3 weeks  Problem List  Past Medical History  Diagnosis Date  . Hypothyroidism   . Migraine   . Osteopenia   . Colonic polyp     bx adenomatous polyps, next scope 2010  . Pleuritic chest pain     etiology unclear, has beeb eval by pulmonary and rheumatology (autoimmune dz?)  . Depression   . Chest pain     stress test @ [facility] cardiology (-)  . Fibromyalgia     see rheumatology  . 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 Glaucoma     Past Surgical History  Procedure Laterality Date  . Tonsillectomy    . Right rotator cuff repair  2010  . 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  . Esophagogastroduodenoscopy (egd) with propofol N/A 03/27/2013    Procedure: ESOPHAGOGASTRODUODENOSCOPY (EGD) WITH PROPOFOL;  Surgeon: Milus Banister, MD;  Location: [facility] ENDOS[facility];  Service: Endoscopy;  Laterality: N/A;  possible dil  . Plastic surgery face  2013     Allergies  Allergies  Allergen Reactions  . Alendronate Sodium     REACTION: urticaria (hives)  . Bactrim [Sulfamethoxazole-Trimethoprim]     Upset stomach  . Milnacipran     REACTION: increased bp, increased hr  . Penicillins     REACTION: urticaria (hives)    HPI   [name] is a pleasant 70 year old Caucasian female with past medical history of fibromyalgia and hypothyroidism, however no past cardiac history. She does not know her family history since she is adopted. Her last stress test and echocardiogram was done in 2005, she was diagnosed with pleuritic chest pain at the time  by pulmonology. She has been doing quite well since that time. She also quit smoking around 2005.  For the past 3 weeks, she has been having some intermittent chest pain at rest. She has had 3 episodes so far all occurring at rest, and last roughly 20-30 minutes each. She denies any chest pain with exertion, however she also states she has not been exerting recently because she has a torn hip muscle. She eventually sought medical attention at [facility], EKG was negative for acute changes. Troponin was negative. Lipid panel was normal. Urine drug test was positive for opiate. Cardiology has been consulted for chest pain. She does say she has occasional palpitation symptom, however denies any true palpitation during the chest pain.  Inpatient Medications  . aspirin  324 mg Oral Daily  . calcium carbonate  1 tablet Oral Q breakfast  . cholecalciferol  1,000 Units Oral Daily  . [START ON 05/24/2015] conjugated estrogens  1 Applicatorful Vaginal Once per day on Mon Thu  . cycloSPORINE  1 drop Both Eyes BID  . heparin  5,000 Units Subcutaneous 3 times per day  . levothyroxine  88 mcg Oral QAC breakfast  . multivitamin with minerals  1 tablet Oral Daily  . pantoprazole  40 mg Oral Daily  . polycarbophil  625 mg Oral Daily    Family History Family History  Problem Relation  Age of Onset  . Adopted: Yes  . Colon cancer Neg Hx   . Cancer Neg Hx   . Depression Neg Hx   . Diabetes Neg Hx   . Stroke Neg Hx   . Hypertension Neg Hx   . Heart disease Neg Hx      Social History Social History   Social History  . Marital Status: Married    Spouse Name: N/A  . Number of Children: 0  . Years of Education: N/A   Occupational History  . retired, Medical sales representative work    Social History Main Topics  . Smoking status: Former Smoker -- 1.00 packs/day for 38.5 years    Types: Cigarettes    Quit date: 01/13/2004  . Smokeless tobacco: Never Used     Comment: quit 2005  . Alcohol Use: 0.0 oz/week      0 Standard drinks or equivalent per week     Comment: rare  . Drug Use: No  . Sexual Activity: Not Currently   Other Topics Concern  . Not on file   Social History Narrative   No biological children           Review of Systems  General:  No chills, fever, night sweats or weight changes.  Cardiovascular:  No dyspnea on exertion, edema, orthopnea, paroxysmal nocturnal dyspnea. +chest pain, occasional palpitation Dermatological: No rash, lesions/masses Respiratory: No cough, dyspnea Urologic: No hematuria, dysuria Abdominal:   No nausea, vomiting, diarrhea, bright red blood per rectum, melena, or hematemesis Neurologic:  No visual changes, wkns, changes in mental status. All other systems reviewed and are otherwise negative except as noted above.  Physical Exam  Blood pressure 140/74, pulse 63, temperature 97.8 F (36.6 C), temperature source Oral, resp. rate 16, height 5\' 6"  (1.676 m), weight 169 lb (76.658 kg), SpO2 99 %.  General: Pleasant, NAD Psych: Normal affect. Neuro: Alert and oriented X 3. Moves all extremities spontaneously. HEENT: Normal  Neck: Supple without bruits or JVD. Lungs:  Resp regular and unlabored, CTA. Heart: RRR no s3, s4, or murmurs. Abdomen: Soft, non-tender, non-distended, BS + x 4.  Extremities: No clubbing, cyanosis or edema. DP/PT/Radials 2+ and equal bilaterally.  Labs   Recent Labs  05/21/15 1850  TROPONINI <0.03   Lab Results  Component Value Date   WBC 7.7 05/21/2015   HGB 12.5 05/21/2015   HCT 39.3 05/21/2015   MCV 94.7 05/21/2015   PLT 245 05/21/2015    Recent Labs Lab 05/21/15 1850  NA 137  K 3.7  CL 103  CO2 27  BUN 11  CREATININE 0.74  CALCIUM 8.6*  PROT 6.8  BILITOT 0.4  ALKPHOS 65  ALT 13*  AST 17  GLUCOSE 116*   Lab Results  Component Value Date   CHOL 145 05/22/2015   HDL 48 05/22/2015   LDLCALC 77 05/22/2015   TRIG 98 05/22/2015   No results found for: DDIMER  Radiology/Studies  Dg Chest 2  View  05/21/2015  CLINICAL DATA:  70 year old female with chest pain EXAM: CHEST  2 VIEW COMPARISON:  CT dated 09/11/2014 FINDINGS: The heart size and mediastinal contours are within normal limits. Both lungs are clear. The visualized skeletal structures are unremarkable. IMPRESSION: No active cardiopulmonary disease. Electronically Signed   By: Anner Crete M.D.   On: 05/21/2015 18:48    ECG  Normal sinus rhythm without significant ST-T wave changes.  ASSESSMENT AND PLAN  1. Chest pain at rest: No significant EKG changes initial troponin  negative.  - Discussed both invasive and noninvasive options with the patient, we'll pursue Lexiscan stress test today. Patient has appointment muscle, and cannot walk on the treadmill at this time. If Myoview is negative, expect discharge if normal echo. Also pending echocardiogram.  2. fibromyalgia  3. Hypothyroidism 4. Palpitation: Although patient mentions palpitation occurring on a daily basis that has been chronic and are not related to the recent chest pain, I have not seen any significant ventricular ectopy on telemetry overnight, occasional PVC.  Hilbert Corrigan, PA-C 05/22/2015, 10:57 AM  I have seen and examined the patient along with [facility]. [place], [place].  I have reviewed the chart, notes and new data.  I agree with PA's note.  Key new complaints: no chest pain right now Key examination changes: normal CV exam Key new findings / data:  low risk enzymes and ECG  PLAN: Intermediate risk for CAD. Unfortunately she is unable to perform treadmill exercise. Lexiscan Myoview today.  Sanda Klein, MD, [place] [phone] 05/22/2015, 11:41 AM

## 2015-05-24 ENCOUNTER — Telehealth: Payer: Self-pay | Admitting: Behavioral Health

## 2015-05-24 ENCOUNTER — Encounter: Payer: Self-pay | Admitting: Internal Medicine

## 2015-05-24 ENCOUNTER — Ambulatory Visit (INDEPENDENT_AMBULATORY_CARE_PROVIDER_SITE_OTHER): Payer: PPO | Admitting: Internal Medicine

## 2015-05-24 VITALS — BP 122/78 | HR 66 | Temp 97.7°F | Ht 66.0 in | Wt 172.4 lb

## 2015-05-24 DIAGNOSIS — S76012D Strain of muscle, fascia and tendon of left hip, subsequent encounter: Secondary | ICD-10-CM | POA: Diagnosis not present

## 2015-05-24 DIAGNOSIS — R079 Chest pain, unspecified: Secondary | ICD-10-CM | POA: Diagnosis not present

## 2015-05-24 DIAGNOSIS — Z09 Encounter for follow-up examination after completed treatment for conditions other than malignant neoplasm: Secondary | ICD-10-CM

## 2015-05-24 LAB — HEMOGLOBIN A1C
HEMOGLOBIN A1C: 5.5 % (ref 4.8–5.6)
MEAN PLASMA GLUCOSE: 111 mg/dL

## 2015-05-24 MED ORDER — ALPRAZOLAM 0.5 MG PO TABS: 0.5000 mg | ORAL_TABLET | Freq: Every evening | ORAL | Status: DC | PRN

## 2015-05-24 NOTE — Progress Notes (Signed)
Subjective:    Patient ID: [name], female    DOB: 1945-09-18, 70 y.o.   MRN: [id number]  DOS:  05/24/2015 Type of visit - description : Hospital follow-up Interval history:  Admitted 05/21/2015, discharged the next day. Had chest pain, EKG enzymes negative, nuclear stress test low risk. Was discharged home. Labs reviewed: BMP, LFTs, CBC, satisfactory. LDL 77 Chest x-ray normal.   Review of Systems He was admitted with 3 episodes of chest pain, anterior, pressure-like, some association with shortness of breath. Workup was negative, since she left the hospital she is doing well and asymptomatic. On looking back, one of the episodes decrease after she took half Xanax. Denies fever chills No nausea vomiting No GERD symptoms No cough  Past Medical History  Diagnosis Date  . Hypothyroidism   . Migraine   . Osteopenia   . Colonic polyp     bx adenomatous polyps, next scope 2010  . Pleuritic chest pain     etiology unclear, has beeb eval by pulmonary and rheumatology (autoimmune dz?)  . Depression   . Chest pain     stress test @ [facility] cardiology (-)  . Fibromyalgia     see rheumatology  . 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 Glaucoma     Past Surgical History  Procedure Laterality Date  . Tonsillectomy    . Right rotator cuff repair  2010  . 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  . Esophagogastroduodenoscopy (egd) with propofol N/A 03/27/2013    Procedure: ESOPHAGOGASTRODUODENOSCOPY (EGD) WITH PROPOFOL;  Surgeon: Milus Banister, MD;  Location: [facility];  Service: Endoscopy;  Laterality: N/A;  possible dil  . Plastic surgery face  2013    Social History   Social History  . Marital Status: Married    Spouse Name: N/A  . Number of Children: 0  . Years of Education: N/A   Occupational History  . retired, Medical sales representative work    Social History Main Topics  . Smoking status: Former Smoker -- 1.00 packs/day for 38.5 years    Types: Cigarettes    Quit  date: 01/13/2004  . Smokeless tobacco: Never Used     Comment: quit 2005  . Alcohol Use: 0.0 oz/week    0 Standard drinks or equivalent per week     Comment: rare  . Drug Use: No  . Sexual Activity: Not Currently   Other Topics Concern  . Not on file   Social History Narrative   No biological children              Medication List       This list is accurate as of: 05/24/15  7:31 PM.  Always use your most recent med list.               ALPRAZolam 0.5 MG tablet  Commonly known as:  XANAX  Take 1 tablet (0.5 mg total) by mouth at bedtime as needed for anxiety.     aspirin 81 MG chewable tablet  Chew 1 tablet (81 mg total) by mouth daily.     calcium carbonate 1250 (500 Ca) MG chewable tablet  Commonly known as:  OS-CAL  Chew 1 tablet by mouth daily.     conjugated estrogens vaginal cream  Commonly known as:  PREMARIN  Place 1 Applicatorful vaginally 2 (two) times a week.     HYDROcodone-acetaminophen 5-325 MG tablet  Commonly known as:  NORCO/VICODIN  Take 1  tablet by mouth every 6 (six) hours as needed for severe pain.     levothyroxine 88 MCG tablet  Commonly known as:  SYNTHROID, LEVOTHROID  Take 1 tablet (88 mcg total) by mouth daily before breakfast.     multivitamin capsule  Take 1 capsule by mouth daily.     polycarbophil 625 MG tablet  Commonly known as:  FIBERCON  Take 625 mg by mouth daily.     RESTASIS 0.05 % ophthalmic emulsion  Generic drug:  cycloSPORINE  Place 1 drop into both eyes 2 (two) times daily.     Vitamin D3 1000 units Caps  Take 1,200 Units by mouth.           Objective:   Physical Exam BP 122/78 mmHg  Pulse 66  Temp(Src) 97.7 F (36.5 C) (Oral)  Ht 5\' 6"  (1.676 m)  Wt 172 lb 6 oz (78.189 kg)  BMI 27.84 kg/m2  SpO2 98% General:   Well developed, well nourished . NAD.  HEENT:  Normocephalic . Face symmetric, atraumatic Lungs:  CTA B Normal respiratory effort, no intercostal retractions, no accessory muscle  use. Heart: RRR,  no murmur.  No pretibial edema bilaterally Skin: Not pale. Not jaundice Neurologic:  alert & oriented X3.  Speech normal,  gait appropriate for age and unassisted Psych--  Cognition and judgment appear intact.  Cooperative with normal attention span and concentration.  Behavior appropriate. No anxious or depressed appearing.      Assessment & Plan:   Assessment Hypothyroidism GERD Migraines Osteopenia Glaucoma Ocular Rosacea 2010 H/o Fibromyalgia insmonia-- xanax  MSK- Pain mngmt -- back pain CP admitted, stress lest low risk 05-2015 H/o Pleuritic chest pain, on-off, resolved    PLAN: Chest pain: Resolved, resolved, labs and x-rays and the hospital within normal,etiology unclear but fortunately stress test low risk. rec observation, to call if sx resurface Insomnia: Refill Xanax Back pain, pain management: Continue hydrocodone, her orthopedic doctor prescribed ultram for a recent hip injury but i rec to stay on one type of pain med. RTC 08-2015 CPX

## 2015-05-24 NOTE — Progress Notes (Signed)
Pre visit review using our clinic review tool, if applicable. No additional management support is needed unless otherwise documented below in the visit note. 

## 2015-05-24 NOTE — Telephone Encounter (Signed)
Transition Care Management Follow-up Telephone Call  Admitting Physician: Ivor Costa, MD [id number] Larose Kells, MD  Admit Date: 05/21/2015 Discharge date: 05/22/2015  Recommendations for Outpatient Follow-up:  1. Please repeat CBC/BMET at next visit 2. New Med:ASA 81 mg daily  PRIMARY DISCHARGE DIAGNOSIS: Principal Problem:  Chest pain Active Problems:  Hypothyroidism  Migraine without aura  Fibromyalgia, pain mngmt  Osteoporosis  GERD (gastroesophageal reflux disease)  Depression with anxiety  Numbness and tingling of right arm  Pain in the chest   How have you been since you were released from the hospital? Patient states, "I'm excellent".   Do you understand why you were in the hospital? yes, patient voiced that she had pressure on her chest.   Do you understand the discharge instructions? yes   Where were you discharged to? Home   Items Reviewed:  Medications reviewed: yes  Allergies reviewed: yes  Dietary changes reviewed: yes, per patient no change in diet.  Referrals reviewed: None   Functional Questionnaire:   Activities of Daily Living (ADLs):   She states they are independent in the following: ambulation, bathing and hygiene, feeding, continence, grooming, toileting and dressing States they require assistance with the following: None   Any transportation issues/concerns?: no   Any patient concerns? no   Confirmed importance and date/time of follow-up visits scheduled yes, 05/24/15 at 2:30 PM.  Provider Appointment booked with Dr. Larose Kells.  Confirmed with patient if condition begins to worsen call PCP or go to the ER.  Patient was given the office number and encouraged to call back with question or concerns.  : yes

## 2015-05-24 NOTE — Patient Instructions (Signed)
  GO TO THE FRONT DESK Schedule a complete physical exam to be done by 08-2015 Please be fasting

## 2015-05-24 NOTE — Assessment & Plan Note (Signed)
Chest pain: Resolved, resolved, labs and x-rays and the hospital within normal,etiology unclear but fortunately stress test low risk. rec observation, to call if sx resurface Insomnia: Refill Xanax Back pain, pain management: Continue hydrocodone, her orthopedic doctor prescribed ultram for a recent hip injury but i rec to stay on one type of pain med. RTC 08-2015 CPX

## 2015-05-27 ENCOUNTER — Ambulatory Visit: Payer: PPO | Admitting: Physical Therapy

## 2015-05-27 DIAGNOSIS — R29898 Other symptoms and signs involving the musculoskeletal system: Secondary | ICD-10-CM

## 2015-05-27 DIAGNOSIS — M545 Low back pain: Secondary | ICD-10-CM | POA: Diagnosis not present

## 2015-05-27 NOTE — Therapy (Signed)
[facility] [address]  [place], [place], [address] Phone: [phone]   Fax:  [phone]  Physical Therapy Treatment  Patient Details  Name: [name] MRN: [id number] Date of Birth: [birth year] Referring Provider: Leigh Aurora, MD  Encounter Date: 05/27/2015      PT End of Session - 05/27/15 1101    Visit Number 2   Number of Visits 12   Date for PT Re-Evaluation 07/13/15   PT Start Time 1058   PT Stop Time 1138   PT Time Calculation (min) 40 min      Past Medical History  Diagnosis Date  . Hypothyroidism   . Migraine   . Osteopenia   . Colonic polyp     bx adenomatous polyps, next scope 2010  . Pleuritic chest pain     etiology unclear, has beeb eval by pulmonary and rheumatology (autoimmune dz?)  . Depression   . Chest pain     stress test @ [facility] cardiology (-)  . Fibromyalgia     see rheumatology  . 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 Glaucoma     Past Surgical History  Procedure Laterality Date  . Tonsillectomy    . Right rotator cuff repair  2010  . 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  . Esophagogastroduodenoscopy (egd) with propofol N/A 03/27/2013    Procedure: ESOPHAGOGASTRODUODENOSCOPY (EGD) WITH PROPOFOL;  Surgeon: Milus Banister, MD;  Location: [facility];  Service: Endoscopy;  Laterality: N/A;  possible dil  . Plastic surgery face  2013    There were no vitals filed for this visit.  Visit Diagnosis:  Bilateral low back pain, with sciatica presence unspecified  Weakness of both hips      Subjective Assessment - 05/27/15 1102    Subjective States since initial eval she was admitted to hospital due to chest pain.  All testing normal and no concerns for MI.  Pt also noted sharp pulling pain in posterior L thigh 2 weeks   Currently in Pain? Yes   Pain Score 7    Pain Location Back   Pain Orientation Lower;Left   Pain Descriptors / Indicators Sharp   Aggravating Factors  walking, activity   Multiple Pain Sites No          TODAY'S TREATMENT TherEx - NuStep lvl 3, 3' Supine B FOB Tuck AROM 12x Bridge 10x Stretch B HS, Piriformis, SKTC, LE Nerve glide Bridge with Blue TB at knees 10x3" Hooklying B Hip ABD Blue TB 12x Hooklying Unilateral Hip ADD Blue TB 12x each Hooklying LE March 10x each Supine Mod Thomas hip flexor stretch/RF stretch Standing Hip ABD with Yellow TB at ankles 10x each with B HHA at knee machine             PT Short Term Goals - 05/27/15 1128    PT SHORT TERM GOAL #1   Title pt independent with initial HEP by 05/27/15  has cone some of HEP but limited stretching due to pain/re-injury   Status On-going           PT Long Term Goals - 05/27/15 1129    PT LONG TERM GOAL #1   Title pt independent with advanced HEP as necessary by 07/13/15   Status On-going   PT LONG TERM GOAL #2   Title pt displays B LE MMT 4+/5 or better without c/o LBP by 07/13/15   Status  On-going   PT LONG TERM GOAL #3   Title pt reports LBP improves to no greater than 4/10 at worst and 2/10 on AVG to allow performance of ADLs and chores without restriction by pain by 07/13/15   Status On-going               Plan - 05/27/15 1140    Clinical Impression Statement 1st f/u treatment today since evaluation nearly 2 weeks ago.  Since initial eval pt had bout of sharp, tearing pain again in L buttock/posterior thigh.  She saw Dr. Durward Fortes and he told her PT for LBP is fine and pain may actually be from lower back.  Today pt with some posterior L thigh tenderness but seems sciatic nerve rather than HS pain (palpation and nerve glides).  Today's treatment focused on rather low intensity lumbopelvic strengthening and all seemed well tolerated.   PT Next Visit Plan Progress HEP next treatment; continue lumbopelvic stability progressions; HS stretching; manual PRN; possible mechanical traction with neutral pull   Consulted and Agree  with Plan of Care Patient        Problem List Patient Active Problem List   Diagnosis Date Noted  . PCP NOTES >>>>>>>>>>>>>>>>>>>> 05/24/2015  . Depression with anxiety 05/22/2015  . Numbness and tingling of right arm 05/22/2015  . Pain in the chest   . Chest pain 05/21/2015  . Insomnia 09/01/2014  . GERD (gastroesophageal reflux disease) 03/27/2013  . Dysphagia, pharyngoesophageal phase 03/27/2013  . Annual physical exam 06/12/2011  . Sinusitis, chronic 02/02/2011  . Osteoporosis 06/08/2010  . Fibromyalgia, pain mngmt 01/29/2009  . LIPOMA 09/30/2008  . DEPRESSION 10/29/2007  . Migraine without aura 03/04/2007  . Hypothyroidism 05/15/2006  . COLONIC POLYPS, HX OF 05/15/2006    Morse Brueggemann PT, OCS 05/27/2015, 11:44 AM  [facility] [place], [place], [address] Phone: [phone]   Fax:  [phone]  Name: [name] MRN: [id number] Date of Birth: 1945/06/16

## 2015-06-01 ENCOUNTER — Ambulatory Visit: Payer: PPO | Admitting: Physical Therapy

## 2015-06-01 DIAGNOSIS — M545 Low back pain: Secondary | ICD-10-CM | POA: Diagnosis not present

## 2015-06-01 DIAGNOSIS — R29898 Other symptoms and signs involving the musculoskeletal system: Secondary | ICD-10-CM

## 2015-06-01 NOTE — Therapy (Signed)
[facility] [address]  [place], [place], [address] Phone: [phone]   Fax:  [phone]  Physical Therapy Treatment  Patient Details  Name: [name] MRN: [id number] Date of Birth: 04/18/1945 Referring Provider: Leigh Aurora, MD  Encounter Date: 06/01/2015      PT End of Session - 06/01/15 0805    Visit Number 3   Number of Visits 12   Date for PT Re-Evaluation 07/13/15   PT Start Time 0804   PT Stop Time [id number]   PT Time Calculation (min) 40 min      Past Medical History  Diagnosis Date  . Hypothyroidism   . Migraine   . Osteopenia   . Colonic polyp     bx adenomatous polyps, next scope 2010  . Pleuritic chest pain     etiology unclear, has beeb eval by pulmonary and rheumatology (autoimmune dz?)  . Depression   . Chest pain     stress test @ [facility] cardiology (-)  . Fibromyalgia     see rheumatology  . 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 Glaucoma     Past Surgical History  Procedure Laterality Date  . Tonsillectomy    . Right rotator cuff repair  2010  . 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  . Esophagogastroduodenoscopy (egd) with propofol N/A 03/27/2013    Procedure: ESOPHAGOGASTRODUODENOSCOPY (EGD) WITH PROPOFOL;  Surgeon: Milus Banister, MD;  [facility];  Service: Endoscopy;  Laterality: N/A;  possible dil  . Plastic surgery face  2013    There were no vitals filed for this visit.  Visit Diagnosis:  Bilateral low back pain, with sciatica presence unspecified  Weakness of both hips      Subjective Assessment - 06/01/15 0807    Subjective Noted B lateral hip muscle soreness following last treatment which she states lasted 2 days and made walking uncomfortable.  She reports too early this AM for back pain but states pain was 5/10 yesterday which she states was due to prolonged sitting while playing cards.   Currently in Pain? Yes   Pain Score 5    Pain  Location Back   Pain Orientation Lower       TODAY'S TREATMENT TherEx - NuStep lvl 3, 4' Bridge 10x Stretch B HS, Piriformis, SKTC, LE Nerve glide, Mod Thomas hip flexor Hooklying B Hip ABD Blue TB 12x3" Bridge with Blue TB at knees 12x3" Hooklying LE March 10x each Hooklying L-spine extension self mob with towel roll 2' Knee Flexion Machine 15# 12x               PT Education - 06/01/15 0840    Education provided Yes   Education Details HEP progression   Person(s) Educated Patient   Methods Explanation;Demonstration;Handout   Comprehension Verbalized understanding;Returned demonstration          PT Short Term Goals - 06/01/15 0840    PT SHORT TERM GOAL #1   Title pt independent with initial HEP by 05/27/15   Status Achieved           PT Long Term Goals - 06/01/15 0840    PT LONG TERM GOAL #1   Title pt independent with advanced HEP as necessary by 07/13/15   Status On-going   PT LONG TERM GOAL #2   Title pt displays B LE MMT 4+/5 or better without c/o LBP by 07/13/15  Status On-going   PT LONG TERM GOAL #3   Title pt reports LBP improves to no greater than 4/10 at worst and 2/10 on AVG to allow performance of ADLs and chores without restriction by pain by 07/13/15   Status On-going               Plan - 06/01/15 0841    Clinical Impression Statement Noted B hip muscle soreness after last treatment so did not perform standing Hip ABD today but otherwise performed well and no c/o increased pain today.  Updated HEP today and hoping performing this will limit muscle soreness with treatments.   PT Next Visit Plan Continue lumbopelvic stability progressions; HS stretching; manual PRN; possible mechanical traction with neutral pull   Consulted and Agree with Plan of Care Patient        Problem List Patient Active Problem List   Diagnosis Date Noted  . PCP NOTES >>>>>>>>>>>>>>>>>>>> 05/24/2015  . Depression with anxiety 05/22/2015  . Numbness and  tingling of right arm 05/22/2015  . Pain in the chest   . Chest pain 05/21/2015  . Insomnia 09/01/2014  . GERD (gastroesophageal reflux disease) 03/27/2013  . Dysphagia, pharyngoesophageal phase 03/27/2013  . Annual physical exam 06/12/2011  . Sinusitis, chronic 02/02/2011  . Osteoporosis 06/08/2010  . Fibromyalgia, pain mngmt 01/29/2009  . LIPOMA 09/30/2008  . DEPRESSION 10/29/2007  . Migraine without aura 03/04/2007  . Hypothyroidism 05/15/2006  . COLONIC POLYPS, HX OF 05/15/2006    Viona Hosking PT, OCS 06/01/2015, 8:46 AM  [facility] [place], [place], [address] Phone: [phone]   Fax:  [phone]  Name: [name] MRN: [id number] Date of Birth: 1945-09-13

## 2015-06-07 ENCOUNTER — Telehealth: Payer: Self-pay | Admitting: Internal Medicine

## 2015-06-07 MED ORDER — HYDROCODONE-ACETAMINOPHEN 5-325 MG PO TABS: 1.0000 | ORAL_TABLET | Freq: Four times a day (QID) | ORAL | Status: DC | PRN

## 2015-06-07 NOTE — Telephone Encounter (Signed)
Rx printed, awaiting MD signature.  

## 2015-06-07 NOTE — Telephone Encounter (Signed)
Pt is requesting refill on Hydrocodone.  Last OV: 05/24/2015 Last Fill: 03/02/2015 #300 and 0RF UDS: 09/01/2014 Low risk  Please advise.

## 2015-06-07 NOTE — Telephone Encounter (Signed)
Okay #300, no refills 

## 2015-06-08 NOTE — Telephone Encounter (Signed)
Rx placed at front desk for pick up. Pt informed via MyChart.  

## 2015-06-09 ENCOUNTER — Ambulatory Visit: Payer: PPO | Attending: Internal Medicine | Admitting: Physical Therapy

## 2015-06-09 DIAGNOSIS — M545 Low back pain: Secondary | ICD-10-CM | POA: Diagnosis not present

## 2015-06-09 DIAGNOSIS — R29898 Other symptoms and signs involving the musculoskeletal system: Secondary | ICD-10-CM

## 2015-06-09 DIAGNOSIS — M6289 Other specified disorders of muscle: Secondary | ICD-10-CM | POA: Insufficient documentation

## 2015-06-09 NOTE — Therapy (Addendum)
[facility] [address]  [place], [place], [address] Phone: [phone]   Fax:  [phone]  Physical Therapy Treatment  Patient Details  Name: [name] MRN: [id number] Date of Birth: 1945-06-04 Referring Provider: Leigh Aurora, MD  Encounter Date: 06/09/2015    PT End of Session - 06/09/15    Visit Number 4   Number of Visits 12   Date for PT Re-Evaluation 07/13/15   PT Start Time 1030   PT Stop Time 1113   PT Time Calculation (min) 43 min            Past Medical History  Diagnosis Date  . Hypothyroidism   . Migraine   . Osteopenia   . Colonic polyp     bx adenomatous polyps, next scope 2010  . Pleuritic chest pain     etiology unclear, has beeb eval by pulmonary and rheumatology (autoimmune dz?)  . Depression   . Chest pain     stress test @ [facility] cardiology (-)  . Fibromyalgia     see rheumatology  . 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 Glaucoma     Past Surgical History  Procedure Laterality Date  . Tonsillectomy    . Right rotator cuff repair  2010  . 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  . Esophagogastroduodenoscopy (egd) with propofol N/A 03/27/2013    Procedure: ESOPHAGOGASTRODUODENOSCOPY (EGD) WITH PROPOFOL;  Surgeon: Milus Banister, MD;  [facility]: [facility];  Service: Endoscopy;  Laterality: N/A;  possible dil  . Plastic surgery face  2013    There were no vitals filed for this visit.  Visit Diagnosis:  Bilateral low back pain, with sciatica presence unspecified  Weakness of both hips      Subjective Assessment - 06/09/15 1032    Subjective States lower back pain is improving with regard to intensity and frequency lately; however, L hip has been bothering her more lately and has appointment with MD next Monday due to this.  States hip pain has been waking her at times. States went up/down flight of stairs several times over the weekend and noted  significant increase in pain up to 8/10.   Currently in Pain? Yes   Pain Score 1   1/10 currently, 2/10 on AVG, worst 5/10   Pain Location Back   Pain Orientation Lower   Aggravating Factors  walking, up/down stairs   Pain Relieving Factors supine lying   Pain Score 3  3/10 currently, up to 8/10 at worst   Pain Location Hip   Pain Orientation Left;Lateral            TODAY'S TREATMENT TherEx - NuStep lvl 3, 4'  Manual - STM throughout L buttock and TFL along with strumming to L ITB (good improvement in pain and sensitivity with this)  TherEx - Stretch B HS, Piri, SKTC, and Supine Mod Thomas            PT Short Term Goals - 06/01/15 0840    PT SHORT TERM GOAL #1   Title pt independent with initial HEP by 05/27/15   Status Achieved           PT Long Term Goals - 06/01/15 0840    PT LONG TERM GOAL #1   Title pt independent with advanced HEP as necessary by 07/13/15   Status On-going   PT LONG TERM GOAL #2   Title pt  displays B LE MMT 4+/5 or better without c/o LBP by 07/13/15   Status On-going   PT LONG TERM GOAL #3   Title pt reports LBP improves to no greater than 4/10 at worst and 2/10 on AVG to allow performance of ADLs and chores without restriction by pain by 07/13/15   Status On-going               Plan - 06/09/15 1122    Clinical Impression Statement pt with increased L hip pain lately.  Chief area of pain is L GT but extends into TFL and into upper and medial glutes.  Extensive work through this area today and pt reports noting significant improvement following treatment.  Will try to return to more exercise next appointment.   PT Next Visit Plan return to lumbopelvic stability progressions; continue HS stretching and manual PRN; possible mechanical traction with neutral pull   Consulted and Agree with Plan of Care Patient        Problem List Patient Active Problem List   Diagnosis Date Noted  . PCP NOTES >>>>>>>>>>>>>>>>>>>> 05/24/2015   . Depression with anxiety 05/22/2015  . Numbness and tingling of right arm 05/22/2015  . Pain in the chest   . Chest pain 05/21/2015  . Insomnia 09/01/2014  . GERD (gastroesophageal reflux disease) 03/27/2013  . Dysphagia, pharyngoesophageal phase 03/27/2013  . Annual physical exam 06/12/2011  . Sinusitis, chronic 02/02/2011  . Osteoporosis 06/08/2010  . Fibromyalgia, pain mngmt 01/29/2009  . LIPOMA 09/30/2008  . DEPRESSION 10/29/2007  . Migraine without aura 03/04/2007  . Hypothyroidism 05/15/2006  . COLONIC POLYPS, HX OF 05/15/2006    [name] PT, OCS 06/09/2015, 11:27 AM  [facility] [place], [place], [address] Phone: [phone]   Fax:  [phone]  Name: [name] MRN: [id number] Date of Birth: [birth year]

## 2015-06-11 ENCOUNTER — Ambulatory Visit: Payer: PPO | Admitting: Physical Therapy

## 2015-06-11 DIAGNOSIS — R29898 Other symptoms and signs involving the musculoskeletal system: Secondary | ICD-10-CM

## 2015-06-11 DIAGNOSIS — M545 Low back pain: Secondary | ICD-10-CM | POA: Diagnosis not present

## 2015-06-11 NOTE — Therapy (Signed)
[name] [address]  [place], [place], [address] Phone: [phone]   Fax:  [phone]  Physical Therapy Treatment  Patient Details  Name: [name] MRN: [id number] Date of Birth: June 27, 1945 Referring Provider: Leigh Aurora, MD  Encounter Date: 06/11/2015      PT End of Session - 06/11/15 1045    Visit Number 5   Number of Visits 12   Date for PT Re-Evaluation 07/13/15   PT Start Time 1015   PT Stop Time 1049   PT Time Calculation (min) 34 min      Past Medical History  Diagnosis Date  . Hypothyroidism   . Migraine   . Osteopenia   . Colonic polyp     bx adenomatous polyps, next scope 2010  . Pleuritic chest pain     etiology unclear, has beeb eval by pulmonary and rheumatology (autoimmune dz?)  . Depression   . Chest pain     stress test @ [facility] cardiology (-)  . Fibromyalgia     see rheumatology  . 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 Glaucoma     Past Surgical History  Procedure Laterality Date  . Tonsillectomy    . Right rotator cuff repair  2010  . 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  . Esophagogastroduodenoscopy (egd) with propofol N/A 03/27/2013    Procedure: ESOPHAGOGASTRODUODENOSCOPY (EGD) WITH PROPOFOL;  Surgeon: Milus Banister, MD;  Location: [facility];  Service: Endoscopy;  Laterality: N/A;  possible dil  . Plastic surgery face  2013    There were no vitals filed for this visit.  Visit Diagnosis:  Bilateral low back pain, with sciatica presence unspecified  Weakness of both hips      Subjective Assessment - 06/11/15 1019    Subjective Reports feeling 40% improvement following last treatment.  After leaving last treatment, went shopping for hours and noted increased pain/soreness but next day felt much better and this continues today.   Currently in Pain? Yes   Pain Score 0-No pain   Pain Location Back   Pain Score 2   Pain Location Hip   Pain  Orientation Left;Lateral          TODAY'S TREATMENT TherEx - Bridge 15x Stretch B HS, Piri, SKTC, and Supine Mod Thomas  Manual - STM throughout L buttock and TFL along with strumming to L ITB (good improvement in pain and sensitivity with this)  TherEx - Hooklying March with TrA 10x Bridge with Green TB at knees 10x3" Knee Flexion Machine 15# 15x           PT Short Term Goals - 06/01/15 0840    PT SHORT TERM GOAL #1   Title pt independent with initial HEP by 05/27/15   Status Achieved           PT Long Term Goals - 06/01/15 0840    PT LONG TERM GOAL #1   Title pt independent with advanced HEP as necessary by 07/13/15   Status On-going   PT LONG TERM GOAL #2   Title pt displays B LE MMT 4+/5 or better without c/o LBP by 07/13/15   Status On-going   PT LONG TERM GOAL #3   Title pt reports LBP improves to no greater than 4/10 at worst and 2/10 on AVG to allow performance of ADLs and chores without restriction by pain by 07/13/15   Status On-going  Plan - 06/11/15 1051    Clinical Impression Statement great improvement today vs last treatment 2 days ago.  She reports feeling 40% improvement; however, minimal to no TTP throughout L hip mms today so seems even better than 40% improvement.  Returned to some exercises but kept it limited to prevent return of pain.  Will progress more next week if still feeling good.   PT Next Visit Plan progress lumbopelvic stability exercises if pain still down; continue HS stretching and manual PRN; possible mechanical traction with neutral pull   Consulted and Agree with Plan of Care Patient        Problem List Patient Active Problem List   Diagnosis Date Noted  . PCP NOTES >>>>>>>>>>>>>>>>>>>> 05/24/2015  . Depression with anxiety 05/22/2015  . Numbness and tingling of right arm 05/22/2015  . Pain in the chest   . Chest pain 05/21/2015  . Insomnia 09/01/2014  . GERD (gastroesophageal reflux disease)  03/27/2013  . Dysphagia, pharyngoesophageal phase 03/27/2013  . Annual physical exam 06/12/2011  . Sinusitis, chronic 02/02/2011  . Osteoporosis 06/08/2010  . Fibromyalgia, pain mngmt 01/29/2009  . LIPOMA 09/30/2008  . DEPRESSION 10/29/2007  . Migraine without aura 03/04/2007  . Hypothyroidism 05/15/2006  . COLONIC POLYPS, HX OF 05/15/2006    Alinda Egolf PT, OCS 06/11/2015, 10:53 AM  [facility] [place], [place], [address] Phone: [phone]   Fax:  [phone]  Name: [name] MRN: [id number] Date of Birth: 20-Oct-1945

## 2015-06-14 DIAGNOSIS — S76012S Strain of muscle, fascia and tendon of left hip, sequela: Secondary | ICD-10-CM | POA: Diagnosis not present

## 2015-06-15 ENCOUNTER — Ambulatory Visit: Payer: PPO | Admitting: Physical Therapy

## 2015-06-15 DIAGNOSIS — M545 Low back pain: Secondary | ICD-10-CM

## 2015-06-15 DIAGNOSIS — R29898 Other symptoms and signs involving the musculoskeletal system: Secondary | ICD-10-CM

## 2015-06-15 NOTE — Therapy (Signed)
[facility] [address]  [place], [place], [address] Phone: [phone]   Fax:  [phone]  Physical Therapy Treatment  Patient Details  Name: [name] MRN: [id number] Date of Birth: 1945/12/28 Referring Provider: Leigh Aurora, MD  Encounter Date: 06/15/2015      PT End of Session - 06/15/15 1321    Visit Number 6   Number of Visits 12   Date for PT Re-Evaluation 07/13/15   PT Start Time 1320   PT Stop Time 1407   PT Time Calculation (min) 47 min      Past Medical History  Diagnosis Date  . Hypothyroidism   . Migraine   . Osteopenia   . Colonic polyp     bx adenomatous polyps, next scope 2010  . Pleuritic chest pain     etiology unclear, has beeb eval by pulmonary and rheumatology (autoimmune dz?)  . Depression   . Chest pain     stress test @ [facility] cardiology (-)  . Fibromyalgia     see rheumatology  . 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 Glaucoma     Past Surgical History  Procedure Laterality Date  . Tonsillectomy    . Right rotator cuff repair  2010  . 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  . Esophagogastroduodenoscopy (egd) with propofol N/A 03/27/2013    Procedure: ESOPHAGOGASTRODUODENOSCOPY (EGD) WITH PROPOFOL;  Surgeon: Milus Banister, MD;  [facility]: [facility];  Service: Endoscopy;  Laterality: N/A;  possible dil  . Plastic surgery face  2013    There were no vitals filed for this visit.  Visit Diagnosis:  Bilateral low back pain, with sciatica presence unspecified  Weakness of both hips      Subjective Assessment - 06/15/15 1322    Subjective Has been out and about some today and PT appointment is later than typical for her - due to this she states her pain is higher than typical and rates LBP 4/10 currently.  States was relatively inactive this weekend. Saw MD yesterday and following MD assessment (hip ER and IR ROM especially) pt with increased L hip pain up to  5/10.  States has noted pain in L thigh wrapping from lateral upper thigh to medial lower thigh in area of knee (L3 dermatone)   Currently in Pain? Yes   Pain Score 0-No pain   Pain Location Back   Pain Score 4   Pain Location Hip   Pain Orientation Left;Lateral         TODAY'S TREATMENT Special Testing - POS L FABER, NEG SLR TherEx - Bridge 12x Stretch B HS, Piri, SKTC (very tender with L piri stretch today), Mod Thomas RF stretch Side-Lying Hip ABD AROM 10x each Side-Lying Clam Green TB 12x with R, then performed AROM without TB with L for 12x (very limited ROM with Green TB on L) Prone Hip ER Mignone between ankles 10x3" Prone Hip IR Yellow TB at ankles 10x Piriformis stretch instruction  Kinesiotape - 3 strip star to L buttock medial to GT            PT Education - 06/15/15 1413    Education provided Yes   Education Details piriformis stretch   Person(s) Educated Patient   Methods Explanation;Demonstration;Handout   Comprehension Verbalized understanding;Returned demonstration          PT Short Term Goals - 06/01/15 0840    PT SHORT  TERM GOAL #1   Title pt independent with initial HEP by 05/27/15   Status Achieved           PT Long Term Goals - 06/01/15 0840    PT LONG TERM GOAL #1   Title pt independent with advanced HEP as necessary by 07/13/15   Status On-going   PT LONG TERM GOAL #2   Title pt displays B LE MMT 4+/5 or better without c/o LBP by 07/13/15   Status On-going   PT LONG TERM GOAL #3   Title pt reports LBP improves to no greater than 4/10 at worst and 2/10 on AVG to allow performance of ADLs and chores without restriction by pain by 07/13/15   Status On-going               Plan - 06/15/15 1348    Clinical Impression Statement Pt is reporting good improvement with PT to date stating she has been able to lie on her L side in bed.  She reports noting pain in L LE in L3 dermatone pattern while being active on days of PT treatments.   Special Testing includes POS L FABER, POS L Piriformis, NEG SLR, NEG Eli's.  Pt denies n/t.  Despite L3 dermatone pattern of pain does not seem nerve root impingment but rather soft tissue issue (piriformis)   PT Next Visit Plan progress lumbopelvic stability exercises if pain still down; continue HS stretching and manual PRN; possible mechanical traction with neutral pull   Consulted and Agree with Plan of Care Patient        Problem List Patient Active Problem List   Diagnosis Date Noted  . PCP NOTES >>>>>>>>>>>>>>>>>>>> 05/24/2015  . Depression with anxiety 05/22/2015  . Numbness and tingling of right arm 05/22/2015  . Pain in the chest   . Chest pain 05/21/2015  . Insomnia 09/01/2014  . GERD (gastroesophageal reflux disease) 03/27/2013  . Dysphagia, pharyngoesophageal phase 03/27/2013  . Annual physical exam 06/12/2011  . Sinusitis, chronic 02/02/2011  . Osteoporosis 06/08/2010  . Fibromyalgia, pain mngmt 01/29/2009  . LIPOMA 09/30/2008  . DEPRESSION 10/29/2007  . Migraine without aura 03/04/2007  . Hypothyroidism 05/15/2006  . COLONIC POLYPS, HX OF 05/15/2006    Gretchen Weinfeld PT, OCS 06/15/2015, 2:15 PM  [facility] [address]  [place], [place], [address] Phone: [phone]   Fax:  [phone]  Name: [name] MRN: [id number] Date of Birth: Apr 09, 1946

## 2015-06-18 ENCOUNTER — Ambulatory Visit: Payer: PPO | Admitting: Physical Therapy

## 2015-06-18 DIAGNOSIS — R29898 Other symptoms and signs involving the musculoskeletal system: Secondary | ICD-10-CM

## 2015-06-18 DIAGNOSIS — M545 Low back pain: Secondary | ICD-10-CM | POA: Diagnosis not present

## 2015-06-18 NOTE — Therapy (Signed)
[facility] [address]  [place], [place], [address] Phone: [phone]   Fax:  [phone]  Physical Therapy Treatment  Patient Details  Name: [name] MRN: [id number] Date of Birth: [birth year] Referring Provider: Leigh Aurora, MD  Encounter Date: 06/18/2015      PT End of Session - 06/18/15 1023    Visit Number 7   Number of Visits 12   Date for PT Re-Evaluation 07/13/15   PT Start Time 1018   PT Stop Time 1058   PT Time Calculation (min) 40 min      Past Medical History  Diagnosis Date  . Hypothyroidism   . Migraine   . Osteopenia   . Colonic polyp     bx adenomatous polyps, next scope 2010  . Pleuritic chest pain     etiology unclear, has beeb eval by pulmonary and rheumatology (autoimmune dz?)  . Depression   . Chest pain     stress test @ [facility] cardiology (-)  . Fibromyalgia     see rheumatology  . 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 Glaucoma     Past Surgical History  Procedure Laterality Date  . Tonsillectomy    . Right rotator cuff repair  2010  . 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  . Esophagogastroduodenoscopy (egd) with propofol N/A 03/27/2013    Procedure: ESOPHAGOGASTRODUODENOSCOPY (EGD) WITH PROPOFOL;  Surgeon: Milus Banister, MD;  Location: [facility];  Service: Endoscopy;  Laterality: N/A;  possible dil  . Plastic surgery face  2013    There were no vitals filed for this visit.  Visit Diagnosis:  Bilateral low back pain, with sciatica presence unspecified  Weakness of both hips      Subjective Assessment - 06/18/15 1020    Subjective Has noted progressive increase in pain to lower back and L hip.   Currently in Pain? Yes   Pain Score 5    Pain Location Back   Pain Orientation Lower   Pain Radiating Towards pain extending from L Lower back to buttock and upper 1/2 posterior thigh   Pain Score 5   Pain Location Hip   Pain Orientation Left;Lateral         TODAY'S TREATMENT TherEx - Bridge 10x HS and Piri stretch Hooklying l-spine self-mob with towel roll to l-spine 2'  Manual - Supine manual traction x 5' Prone with pillow under belly x-hand l-spine MFR then grade 2 CPA mobs throughout l-spine (most tenderness noted L2/3 area)  TherEx - POE 30" Prone ALT SLR 5x each with pillow under belly Prone Hip Ext knee flexed 6x R, 10x L (R fatigued and noted some R LBP) Seated Low Row with Sport Cord 12x           PT Short Term Goals - 06/01/15 0840    PT SHORT TERM GOAL #1   Title pt independent with initial HEP by 05/27/15   Status Achieved           PT Long Term Goals - 06/01/15 0840    PT LONG TERM GOAL #1   Title pt independent with advanced HEP as necessary by 07/13/15   Status On-going   PT LONG TERM GOAL #2   Title pt displays B LE MMT 4+/5 or better without c/o LBP by 07/13/15   Status On-going   PT LONG TERM GOAL #3   Title pt reports LBP improves  to no greater than 4/10 at worst and 2/10 on AVG to allow performance of ADLs and chores without restriction by pain by 07/13/15   Status On-going               Plan - 06/18/15 1100    Clinical Impression Statement Pt reports noting progressive increase in L LBP and L Hip pain past few days.  LBP extends to posterior L thigh and hip pain continues to be lateral hip.  She states she's been very sedentary lately with a lot of sitting and even prolonged computer work so possible HNP? or just soft tissue strain due to prolonged sitting in poor posture.  Reviewed this with pt and encouraged frequent movement from seated positions.  Today's session more extension bias in focus and was well tolerated with improvement in pain noted.   PT Next Visit Plan progress lumbopelvic stability exercises if pain improves, otherwise continue with extension bias as necessary; continue HS stretching and manual PRN; possible mechanical traction with neutral pull   Consulted and Agree  with Plan of Care Patient        Problem List Patient Active Problem List   Diagnosis Date Noted  . PCP NOTES >>>>>>>>>>>>>>>>>>>> 05/24/2015  . Depression with anxiety 05/22/2015  . Numbness and tingling of right arm 05/22/2015  . Pain in the chest   . Chest pain 05/21/2015  . Insomnia 09/01/2014  . GERD (gastroesophageal reflux disease) 03/27/2013  . Dysphagia, pharyngoesophageal phase 03/27/2013  . Annual physical exam 06/12/2011  . Sinusitis, chronic 02/02/2011  . Osteoporosis 06/08/2010  . Fibromyalgia, pain mngmt 01/29/2009  . LIPOMA 09/30/2008  . DEPRESSION 10/29/2007  . Migraine without aura 03/04/2007  . Hypothyroidism 05/15/2006  . COLONIC POLYPS, HX OF 05/15/2006    Manan Olmo PT, OCS 06/18/2015, 11:03 AM  [facility] [address]  [place], [place], [address] Phone: [phone]   Fax:  [phone]  Name: [name] MRN: [id number] Date of Birth: 14-Mar-1946

## 2015-06-22 ENCOUNTER — Ambulatory Visit: Payer: PPO | Admitting: Physical Therapy

## 2015-06-22 DIAGNOSIS — M545 Low back pain: Secondary | ICD-10-CM | POA: Diagnosis not present

## 2015-06-22 DIAGNOSIS — R29898 Other symptoms and signs involving the musculoskeletal system: Secondary | ICD-10-CM

## 2015-06-22 NOTE — Therapy (Signed)
[facility] [address]  [place], [place], [address] Phone: [phone]   Fax:  [phone]  Physical Therapy Treatment  Patient Details  Name: [name] MRN: [id number] Date of Birth: 03/27/1946 Referring Provider: Leigh Aurora, MD  Encounter Date: 06/22/2015      PT End of Session - 06/22/15 1317    Visit Number 8   Number of Visits 12   Date for PT Re-Evaluation 07/13/15   PT Start Time [id number]   PT Stop Time 1418   PT Time Calculation (min) 62 min      Past Medical History  Diagnosis Date  . Hypothyroidism   . Migraine   . Osteopenia   . Colonic polyp     bx adenomatous polyps, next scope 2010  . Pleuritic chest pain     etiology unclear, has beeb eval by pulmonary and rheumatology (autoimmune dz?)  . Depression   . Chest pain     stress test @ [facility] cardiology (-)  . Fibromyalgia     see rheumatology  . 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 Glaucoma     Past Surgical History  Procedure Laterality Date  . Tonsillectomy    . Right rotator cuff repair  2010  . 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  . Esophagogastroduodenoscopy (egd) with propofol N/A 03/27/2013    Procedure: ESOPHAGOGASTRODUODENOSCOPY (EGD) WITH PROPOFOL;  Surgeon: Milus Banister, MD;  Location: [facility];  Service: Endoscopy;  Laterality: N/A;  possible dil  . Plastic surgery face  2013    There were no vitals filed for this visit.  Visit Diagnosis:  Bilateral low back pain, with sciatica presence unspecified  Weakness of both hips      Subjective Assessment - 06/22/15 1317    Subjective States L hip pain has progressed from intermittent to constant and states has difficulty telling difference between back and hip pain. Unknown reason for increase stating was 4/10 yesterday.   Currently in Pain? Yes   Pain Score 6    Pain Location Back   Pain Orientation Lower   Pain Score 6   Pain Location Hip   Pain  Orientation Left;Lateral          TODAY'S TREATMENT TherEx - Hooklying B Hip ABD AROM 10 Hooklying Unilateral Hip ABD with TrA 10x Bridge 10x HS and Piri stretch  Manual - L ITB Strumming and STM L lateral glutes and TFL  TherEx - Quadruped LE Ext 10x each Quadruped Dirty Dog 5x each  Manual -  Prone with pillow under belly x-hand l-spine MFR then grade 2 CPA mobs throughout l-spine (most tenderness noted L2/3 area) Prone manual traction x 5'  TherEx - Prone ALT SLR 8x each with pillow under belly (mild pain R lower back/buttock)  IFC (50-180Hz ) B L-spine with pt prone due to pain with last exercise which persisted           PT Short Term Goals - 06/01/15 0840    PT SHORT TERM GOAL #1   Title pt independent with initial HEP by 05/27/15   Status Achieved           PT Long Term Goals - 06/01/15 0840    PT LONG TERM GOAL #1   Title pt independent with advanced HEP as necessary by 07/13/15   Status On-going   PT LONG TERM GOAL #2   Title pt displays B  LE MMT 4+/5 or better without c/o LBP by 07/13/15   Status On-going   PT LONG TERM GOAL #3   Title pt reports LBP improves to no greater than 4/10 at worst and 2/10 on AVG to allow performance of ADLs and chores without restriction by pain by 07/13/15   Status On-going               Plan - 06/22/15 1516    Clinical Impression Statement Less pain over the weekend (4/10) but random increased pain earlier today (6/10). She was very TTP L lateral Hip GT and into proximal glutes on L.  Tolerated exercises well and manual to this area seemed beneficial. However, she c/o R LBP with prone exercises and with prone MFR today.  Due to this performed IFC to l-spine following treatment.  She states this seemed to help but was stiff after prolonged prone lying.   PT Next Visit Plan progress lumbopelvic stability exercises if pain improves, otherwise continue with extension bias as necessary; continue HS stretching and manual  PRN; possible mechanical traction with neutral pull   Consulted and Agree with Plan of Care Patient        Problem List Patient Active Problem List   Diagnosis Date Noted  . PCP NOTES >>>>>>>>>>>>>>>>>>>> 05/24/2015  . Depression with anxiety 05/22/2015  . Numbness and tingling of right arm 05/22/2015  . Pain in the chest   . Chest pain 05/21/2015  . Insomnia 09/01/2014  . GERD (gastroesophageal reflux disease) 03/27/2013  . Dysphagia, pharyngoesophageal phase 03/27/2013  . Annual physical exam 06/12/2011  . Sinusitis, chronic 02/02/2011  . Osteoporosis 06/08/2010  . Fibromyalgia, pain mngmt 01/29/2009  . LIPOMA 09/30/2008  . DEPRESSION 10/29/2007  . Migraine without aura 03/04/2007  . Hypothyroidism 05/15/2006  . COLONIC POLYPS, HX OF 05/15/2006    [name] PT, OCS 06/22/2015, 3:25 PM  [facility] [address]  [place], [place], [address] Phone: [phone]   Fax:  [phone]  Name: [name] MRN: [id number] Date of Birth: 11-11-1945

## 2015-06-24 ENCOUNTER — Ambulatory Visit: Payer: PPO | Admitting: Physical Therapy

## 2015-06-24 DIAGNOSIS — R29898 Other symptoms and signs involving the musculoskeletal system: Secondary | ICD-10-CM

## 2015-06-24 DIAGNOSIS — M545 Low back pain: Secondary | ICD-10-CM | POA: Diagnosis not present

## 2015-06-24 NOTE — Therapy (Signed)
[facility] [address]  [place], [place], [address] Phone: [phone]   Fax:  [phone]  Physical Therapy Treatment  Patient Details  Name: [name] MRN: [id number] Date of Birth: [birth year] Referring Provider: Leigh Aurora, MD  Encounter Date: 06/24/2015      PT End of Session - 06/24/15 1017    Visit Number 9   Number of Visits 12   Date for PT Re-Evaluation 07/13/15   PT Start Time 1016   PT Stop Time 1105   PT Time Calculation (min) 49 min      Past Medical History  Diagnosis Date  . Hypothyroidism   . Migraine   . Osteopenia   . Colonic polyp     bx adenomatous polyps, next scope 2010  . Pleuritic chest pain     etiology unclear, has beeb eval by pulmonary and rheumatology (autoimmune dz?)  . Depression   . Chest pain     stress test @ [facility] cardiology (-)  . Fibromyalgia     see rheumatology  . 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 Glaucoma     Past Surgical History  Procedure Laterality Date  . Tonsillectomy    . Right rotator cuff repair  2010  . 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  . Esophagogastroduodenoscopy (egd) with propofol N/A 03/27/2013    Procedure: ESOPHAGOGASTRODUODENOSCOPY (EGD) WITH PROPOFOL;  Surgeon: Milus Banister, MD;  Location: [facility];  Service: Endoscopy;  Laterality: N/A;  possible dil  . Plastic surgery face  2013    There were no vitals filed for this visit.  Visit Diagnosis:  Bilateral low back pain, with sciatica presence unspecified  Weakness of both hips      Subjective Assessment - 06/24/15 1018    Subjective Pt with c/o increased pain in R LE.  States noted onset while mopping yesterday. Had difficulty sleeping due to pain and has trouble driving due to pain.  Took pain meds last night with mild benefit. Notes mild n/t to anterior R ankle.   Currently in Pain? Yes   Pain Score 8    Pain Location Back   Pain Orientation  Right;Lower   Pain Radiating Towards extending into R posterior LE to knee and to lesser extent from R lateral knee to lateral ankle (L5 dermatone)   Pain Score --  3-4/10   Pain Location Hip   Pain Orientation Left;Lateral            TODAY'S TREATMENT Manual -  Prone grade 2 R UPA mobs throughout l-spine STM R SIJ and lower lumbar paraspinals Supine R LE nerve glides  TherEx - Stretches - prone knee flexion, HS, Piri, gentle SKTC Hooklying with towel roll to l-spine for extension stretching 1' Bridge 10x Side-Lying Hip Clam 10x each Quadruped LE Ext 10x each  Mechanical Traction - l-spine, prone, neutral pull, 60"20", 40#/20#, 8'          PT Short Term Goals - 06/01/15 0840    PT SHORT TERM GOAL #1   Title pt independent with initial HEP by 05/27/15   Status Achieved           PT Long Term Goals - 06/01/15 0840    PT LONG TERM GOAL #1   Title pt independent with advanced HEP as necessary by 07/13/15   Status On-going   PT LONG TERM GOAL #2   Title pt  displays B LE MMT 4+/5 or better without c/o LBP by 07/13/15   Status On-going   PT LONG TERM GOAL #3   Title pt reports LBP improves to no greater than 4/10 at worst and 2/10 on AVG to allow performance of ADLs and chores without restriction by pain by 07/13/15   Status On-going               Plan - 06/24/15 1057    Clinical Impression Statement pt with increased R LBP which includes pain extending into R LE (posterior thigh and lateral lower leg - seems L5 dermatone).  She thinks is due to last PT session however, she then states it started while she was mopping yesterday and that she continued to mop after onset of increased pain.  So it seems more likely that mopping (forward bent posture) is likely cause of her increased pain.  Able to abolish pain below knee with extension bias approach, and posterior thigh pain reduced but not abolished.  Trial of traction today which was well tolerated but still with  "irritation" to R posterior thigh.   PT Next Visit Plan progress lumbopelvic stability exercises if pain improves, otherwise continue with extension bias as necessary; continue HS stretching and manual PRN; continue traction?   Consulted and Agree with Plan of Care Patient        Problem List Patient Active Problem List   Diagnosis Date Noted  . PCP NOTES >>>>>>>>>>>>>>>>>>>> 05/24/2015  . Depression with anxiety 05/22/2015  . Numbness and tingling of right arm 05/22/2015  . Pain in the chest   . Chest pain 05/21/2015  . Insomnia 09/01/2014  . GERD (gastroesophageal reflux disease) 03/27/2013  . Dysphagia, pharyngoesophageal phase 03/27/2013  . Annual physical exam 06/12/2011  . Sinusitis, chronic 02/02/2011  . Osteoporosis 06/08/2010  . Fibromyalgia, pain mngmt 01/29/2009  . LIPOMA 09/30/2008  . DEPRESSION 10/29/2007  . Migraine without aura 03/04/2007  . Hypothyroidism 05/15/2006  . COLONIC POLYPS, HX OF 05/15/2006    Coralyn Roselli PT, OCS 06/24/2015, 11:21 AM  [facility] [place], [place], [address] Phone: [phone]   Fax:  [phone]  Name: [name] MRN: [id number] Date of Birth: 1945/12/01

## 2015-06-29 ENCOUNTER — Other Ambulatory Visit: Payer: Self-pay | Admitting: Acute Care

## 2015-06-29 ENCOUNTER — Ambulatory Visit: Payer: PPO | Admitting: Physical Therapy

## 2015-06-29 DIAGNOSIS — Z87891 Personal history of nicotine dependence: Secondary | ICD-10-CM

## 2015-06-29 DIAGNOSIS — R29898 Other symptoms and signs involving the musculoskeletal system: Secondary | ICD-10-CM

## 2015-06-29 DIAGNOSIS — M545 Low back pain: Secondary | ICD-10-CM | POA: Diagnosis not present

## 2015-06-29 NOTE — Therapy (Signed)
[facility] [address]  [place], [place], [address] Phone: [phone]   Fax:  [phone]  Physical Therapy Treatment  Patient Details  Name: [name] MRN: [id number] Date of Birth: Sep 13, 1945 Referring Provider: Leigh Aurora, MD  Encounter Date: 06/29/2015      PT End of Session - 06/29/15 1024    Visit Number 10   Number of Visits 12   Date for PT Re-Evaluation 07/13/15   PT Start Time [id number]   PT Stop Time 1116   PT Time Calculation (min) 53 min      Past Medical History  Diagnosis Date  . Hypothyroidism   . Migraine   . Osteopenia   . Colonic polyp     bx adenomatous polyps, next scope 2010  . Pleuritic chest pain     etiology unclear, has beeb eval by pulmonary and rheumatology (autoimmune dz?)  . Depression   . Chest pain     stress test @ [facility] cardiology (-)  . Fibromyalgia     see rheumatology  . 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 Glaucoma     Past Surgical History  Procedure Laterality Date  . Tonsillectomy    . Right rotator cuff repair  2010  . 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  . Esophagogastroduodenoscopy (egd) with propofol N/A 03/27/2013    Procedure: ESOPHAGOGASTRODUODENOSCOPY (EGD) WITH PROPOFOL;  Surgeon: Milus Banister, MD;  Location: [facility];  Service: Endoscopy;  Laterality: N/A;  possible dil  . Plastic surgery face  2013    There were no vitals filed for this visit.  Visit Diagnosis:  Bilateral low back pain, with sciatica presence unspecified  Weakness of both hips      Subjective Assessment - 06/29/15 1024    Subjective States is feeling good. States notes increased pain for 2 days following treatments but otherwise feeling good. States overall "it's better than it was". States had to use stairs over the weekend and states this really seems to increase L lateral hip pain (descending stairs is the worst).   Currently in Pain? Yes   Pain  Score 4    Pain Location Back   Pain Orientation Lower   Aggravating Factors  prolonged sitting or standing (especially standing at sink)   Pain Score 5   Pain Location Hip   Pain Orientation Left;Lateral   Aggravating Factors  stairs (descending especially)        TODAY'S TREATMENT TherEx - Hooklying Unilateral Hip ABD AROM with TrA 12x each Hooklying LE March with TrA 10x each Bridge 12x Stretches (performed by PT): HS, Piri, SKTC, Supine Mod Thomas RF and Hip Flexor   Manual - contract/relax L Piriformis, R LE nerve glides  TherEx - Side-Lying Clam ER 12x2" each Side-Lying Hip ABD AROm 10x each  Manual - STM and strumming L upper glutes, TFL, and ITB  Mechanical Traction - l-spine, prone, neutral pull, 60"20", 44#/22#, 10'            PT Short Term Goals - 06/01/15 0840    PT SHORT TERM GOAL #1   Title pt independent with initial HEP by 05/27/15   Status Achieved           PT Long Term Goals - 06/01/15 0840    PT LONG TERM GOAL #1   Title pt independent with advanced HEP as necessary by 07/13/15   Status On-going  PT LONG TERM GOAL #2   Title pt displays B LE MMT 4+/5 or better without c/o LBP by 07/13/15   Status On-going   PT LONG TERM GOAL #3   Title pt reports LBP improves to no greater than 4/10 at worst and 2/10 on AVG to allow performance of ADLs and chores without restriction by pain by 07/13/15   Status On-going               Plan - 2015/07/19 1053    Clinical Impression Statement pt reports soreness following each treatment but states overall she is noting improvements.  States traction initiated last treatment seemed beneficial.  Pt is very sedentary and I believe her soreness is more of a muscle soreness related to exercise/activity which she is not accustomed to.  FOTO score taken today is same as at initial eval.   PT Next Visit Plan progress lumbopelvic stability exercises as able; continue HS stretching and manual PRN; continue traction    Consulted and Agree with Plan of Care Patient          G-Codes - 07/19/15 1026    Functional Assessment Tool Used Foto 46% limitation   Functional Limitation Mobility: Walking and moving around   Mobility: Walking and Moving Around Current Status [phone]) At least 40 percent but less than 60 percent impaired, limited or restricted   Mobility: Walking and Moving Around Goal Status [phone]) At least 20 percent but less than 40 percent impaired, limited or restricted      Problem List Patient Active Problem List   Diagnosis Date Noted  . PCP NOTES >>>>>>>>>>>>>>>>>>>> 05/24/2015  . Depression with anxiety 05/22/2015  . Numbness and tingling of right arm 05/22/2015  . Pain in the chest   . Chest pain 05/21/2015  . Insomnia 09/01/2014  . GERD (gastroesophageal reflux disease) 03/27/2013  . Dysphagia, pharyngoesophageal phase 03/27/2013  . Annual physical exam 06/12/2011  . Sinusitis, chronic 02/02/2011  . Osteoporosis 06/08/2010  . Fibromyalgia, pain mngmt 01/29/2009  . LIPOMA 09/30/2008  . DEPRESSION 10/29/2007  . Migraine without aura 03/04/2007  . Hypothyroidism 05/15/2006  . COLONIC POLYPS, HX OF 05/15/2006    Jeniah Kishi PT, OCS 2015-07-19, 11:43 AM  [facility] [place], [place], [address] Phone: [phone]   Fax:  [phone]  Name: [name] MRN: [id number] Date of Birth: [birth year]

## 2015-07-02 ENCOUNTER — Ambulatory Visit: Payer: PPO | Admitting: Physical Therapy

## 2015-07-06 ENCOUNTER — Ambulatory Visit: Payer: PPO | Admitting: Physical Therapy

## 2015-07-06 DIAGNOSIS — M545 Low back pain: Secondary | ICD-10-CM | POA: Diagnosis not present

## 2015-07-06 DIAGNOSIS — R29898 Other symptoms and signs involving the musculoskeletal system: Secondary | ICD-10-CM

## 2015-07-06 NOTE — Therapy (Signed)
[facility] [address]  [place], [place], [address] Phone: [phone]   Fax:  [phone]  Physical Therapy Treatment  Patient Details  Name: [name] MRN: [id number] Date of Birth: 1945-10-02 Referring Provider: Leigh Aurora, MD  Encounter Date: 07/06/2015      PT End of Session - 07/06/15 1150    Visit Number 11   Number of Visits 12   Date for PT Re-Evaluation 07/13/15   PT Start Time [id number]   PT Stop Time 1148   PT Time Calculation (min) 46 min      Past Medical History  Diagnosis Date  . Hypothyroidism   . Migraine   . Osteopenia   . Colonic polyp     bx adenomatous polyps, next scope 2010  . Pleuritic chest pain     etiology unclear, has beeb eval by pulmonary and rheumatology (autoimmune dz?)  . Depression   . Chest pain     stress test @ [facility] cardiology (-)  . Fibromyalgia     see rheumatology  . 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 Glaucoma     Past Surgical History  Procedure Laterality Date  . Tonsillectomy    . Right rotator cuff repair  2010  . 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  . Esophagogastroduodenoscopy (egd) with propofol N/A 03/27/2013    Procedure: ESOPHAGOGASTRODUODENOSCOPY (EGD) WITH PROPOFOL;  Surgeon: Milus Banister, MD;  Location: [facility];  Service: Endoscopy;  Laterality: N/A;  possible dil  . Plastic surgery face  2013    There were no vitals filed for this visit.  Visit Diagnosis:  Bilateral low back pain, with sciatica presence unspecified  Weakness of both hips      Subjective Assessment - 07/06/15 1104    Subjective Was up on feet 3-4 hours last Friday and c/o severe increase in L LBP (in area of L SIJ) as well as into lateral L hip up to 10/10.  This improved to 5/10 over the weekend then performed housework yesterday and pain up to 8/10. Currently 4/10 in lateral L hip, no LBP so far today.   Currently in Pain? Yes   Pain Score 0-No  pain  "it's alright today"   Pain Location Back   Pain Score 4   Pain Location Hip   Pain Orientation Left;Lateral              TODAY'S TREATMENT TherEx - Hooklying Unilateral Hip ABD AROM with TrA 12x each Hooklying LE March with TrA 10x each Stretches (performed by PT): HS, Piri, SKTC, Supine Mod Thomas RF and Hip Flexor   Manual - B LE nerve glides  TherEx - B FOB (55cm) tuck AROM and Bridge combo 10x Hooklying B Hip ABD Blue TB 15x Side-Lying Clam ER Red TB 10x each Standing Hip ABD in/out of Trendelenburg 15x each with Single Pole A (more difficult on L) Labriola on Wall (55cm) squat 10x  Manual - STM and strumming L upper glutes and ITB           PT Short Term Goals - 06/01/15 0840    PT SHORT TERM GOAL #1   Title pt independent with initial HEP by 05/27/15   Status Achieved           PT Long Term Goals - 06/01/15 0840    PT LONG TERM GOAL #1   Title pt independent with advanced  HEP as necessary by 07/13/15   Status On-going   PT LONG TERM GOAL #2   Title pt displays B LE MMT 4+/5 or better without c/o LBP by 07/13/15   Status On-going   PT LONG TERM GOAL #3   Title pt reports LBP improves to no greater than 4/10 at worst and 2/10 on AVG to allow performance of ADLs and chores without restriction by pain by 07/13/15   Status On-going               Plan - 07/06/15 1152    Clinical Impression Statement Pt with c/o 10/10 pain into L lateral hip and lower back (L>R) this past Friday due to prolonged walking/standing on concrete.  Pain decreased to 5/10 next day but up to 8/10 yesterday with housework. Difficult to tell which is lower back vs hip pain and they may be one in the same; however, today she only c/o pain in L lateral hip and denies LBP so seems as though is 2 different issues.  Pain with prolonged standing on concrete seems could be related to mild Hip OA seen on CT?  Pt has contacted MD office due to her pain lately and is waiting for return  call.   PT Next Visit Plan progress lumbopelvic stability exercises as able; continue HS stretching and manual PRN; continue traction PRN   Consulted and Agree with Plan of Care Patient        Problem List Patient Active Problem List   Diagnosis Date Noted  . PCP NOTES >>>>>>>>>>>>>>>>>>>> 05/24/2015  . Depression with anxiety 05/22/2015  . Numbness and tingling of right arm 05/22/2015  . Pain in the chest   . Chest pain 05/21/2015  . Insomnia 09/01/2014  . GERD (gastroesophageal reflux disease) 03/27/2013  . Dysphagia, pharyngoesophageal phase 03/27/2013  . Annual physical exam 06/12/2011  . Sinusitis, chronic 02/02/2011  . Osteoporosis 06/08/2010  . Fibromyalgia, pain mngmt 01/29/2009  . LIPOMA 09/30/2008  . DEPRESSION 10/29/2007  . Migraine without aura 03/04/2007  . Hypothyroidism 05/15/2006  . COLONIC POLYPS, HX OF 05/15/2006    Osmar Howton PT, OCS 07/06/2015, 11:56 AM  [facility] [place], [place], [address] Phone: [phone]   Fax:  [phone]  Name: [name] MRN: [id number] Date of Birth: [birth year]

## 2015-07-13 ENCOUNTER — Ambulatory Visit: Payer: PPO | Attending: Internal Medicine | Admitting: Physical Therapy

## 2015-07-13 DIAGNOSIS — M6289 Other specified disorders of muscle: Secondary | ICD-10-CM | POA: Diagnosis not present

## 2015-07-13 DIAGNOSIS — M545 Low back pain: Secondary | ICD-10-CM | POA: Diagnosis not present

## 2015-07-13 NOTE — Therapy (Signed)
[facility] [address]  [place], [place], [address] Phone: [phone]   Fax:  [phone]  Physical Therapy Treatment  Patient Details  Name: [name] MRN: [id number] Date of Birth: 08/15/1945 Referring Provider: Leigh Aurora, MD  Encounter Date: 07/13/2015      PT End of Session - 07/13/15 1322    Visit Number 12   Number of Visits 20   Date for PT Re-Evaluation 08/24/15   PT Start Time 1320   PT Stop Time 1430   PT Time Calculation (min) 70 min      Past Medical History  Diagnosis Date  . Hypothyroidism   . Migraine   . Osteopenia   . Colonic polyp     bx adenomatous polyps, next scope 2010  . Pleuritic chest pain     etiology unclear, has beeb eval by pulmonary and rheumatology (autoimmune dz?)  . Depression   . Chest pain     stress test @ [facility] cardiology (-)  . Fibromyalgia     see rheumatology  . 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 Glaucoma     Past Surgical History  Procedure Laterality Date  . Tonsillectomy    . Right rotator cuff repair  2010  . 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  . Esophagogastroduodenoscopy (egd) with propofol N/A 03/27/2013    Procedure: ESOPHAGOGASTRODUODENOSCOPY (EGD) WITH PROPOFOL;  Surgeon: Milus Banister, MD;  Location: [facility];  Service: Endoscopy;  Laterality: N/A;  possible dil  . Plastic surgery face  2013    There were no vitals filed for this visit.  Visit Diagnosis:  Bilateral low back pain, with sciatica presence unspecified  Weakness of both hips      Subjective Assessment - 07/13/15 1322    Subjective Pt received call from MD's office last week and she was told they are sending her for l-spine MRI.  She hasn't received call regarding when MRI is scheduled. Regarding LBP, she states she feels achie in lower back and into L hip.  States pain extends from lower back into thigh to knee.  States back pain increased this AM for  unknown reason stating pain woke her this AM.   Currently in Pain? Yes   Pain Score 6    Pain Location Back   Pain Orientation Left;Lower   Pain Descriptors / Indicators Aching   Pain Radiating Towards pain extends from Left lower back into posterior L thigh to knee.   Aggravating Factors  prolonged sitting is worst; prolonged standing is second            [facility] PT Assessment - 07/13/15 0001    AROM   Lumbar Flexion hands to distal shins   Lumbar Extension 75% normal   Strength   Right Hip Flexion 4+/5   Right Hip Extension 5/5   Right Hip External Rotation  4+/5   Right Hip Internal Rotation 5/5   Right Hip ABduction 4+/5   Left Hip Flexion 4+/5  no pain   Left Hip Extension 4/5   Left Hip External Rotation 4/5   Left Hip Internal Rotation 4/5   Left Hip ABduction 4/5  no pain   Right Knee Flexion 5/5   Right Knee Extension 4+/5   Left Knee Flexion 4+/5   Left Knee Extension 4+/5   Flexibility   Hamstrings mild tightness   Quadriceps mild tightness  TODAY'S TREATMENT  B LE MMT assessement  TherEx - Pelvic rocking 15x LTR 60" Stretches (performed by PT): HS, Piri, SKTC, Supine Mod Thomas RF and Hip Flexor (very tender L anterior thigh with hip flexor stretch) Bridge 15x Prone Knee Flexion Stretch Prone ALT SLR 10x each Side-Lying Hip ABD 11x with Left Standing Hip ABD in/out of Trendelenburg 20x each with counter A (more difficult on L)   Mechanical Traction - l-spine, prone, neutral pull, 50#/25#, 60"/20", 14'           PT Short Term Goals - 06/01/15 0840    PT SHORT TERM GOAL #1   Title pt independent with initial HEP by 05/27/15   Status Achieved           PT Long Term Goals - 07/13/15 1441    PT LONG TERM GOAL #1   Title pt independent with advanced HEP as necessary by 08/24/15   Status On-going   PT LONG TERM GOAL #2   Title pt displays B LE MMT 4+/5 or better without c/o LBP by 08/24/15  no pain with testing, met in all planes other  than L hip ABD, ER, IR, and Extension   Status Partially Met   PT LONG TERM GOAL #3   Title pt reports LBP improves to no greater than 4/10 at worst and 2/10 on AVG to allow performance of ADLs and chores without restriction by pain by 08/24/15   Status On-going               Plan - 07/13/15 1409    Clinical Impression Statement [name] states she has noted improvement in function and lower back pain since beginning PT; however, her pain continues to fluctuate greatly.  Within the past 2 weeks she has ranged from 0/10 at best to 10/10 at worst (prolonged walking / standing on concrete) and states AVG pain has been 5/10.  There is some difficulty determining whether all of her pain is due to lower back or in part due to some L hip pathology (OA?).  Her symptoms have included lower back pain which has included apparent radicular symptoms to R LE for a couple days (right foot n/t)  Pain in L LE has included posterior, lateral, as well as anterior thigh at different times.  Left posterior thigh pain seems related to lumbar pathology, anterior thigh pain is rarely noted but she is always very tender over distal aspect of hip flexors on Left.  Lateral thigh pain difficult to tell origin.  Her back and Left LE pain increase with prolonged sitting and standing (sitting worse typically).  Left hip/LE pain increased with descending stairs but does not note increased LBP with this.  Chief issue seems lower back pain and Dr. Durward Fortes is sending for a lumbar MRI soon per pt report (not yet scheduled) so this should offer some good insight.  Today's assessment finds improvement in Lumbar AROM since initial eval and no c/o pain with assessment.  B Hip and Knee MMT also performed without c/o pain and improvements noted in strength but still weak on Left vs Right.  B hamstring and quad flexibility has improved to near normal but still mild tightness.  She is tolerating progressions in intensity of her trunk  stability exercises but she leads a rather sedentary lifestyle so her activity tolerance is on the low side.  I feel she will continue to improve with PT interventions and am recommending conitnued PT 2x/wk for 4 wks.  She is going on vaction later this month so POC will be written for 8 more visits in a 6 week period.   Pt will benefit from skilled therapeutic intervention in order to improve on the following deficits Pain;Postural dysfunction;Decreased strength;Decreased mobility;Impaired flexibility;Improper body mechanics   Rehab Potential Good   PT Frequency 2x / week   PT Duration 6 weeks  8 visits in 6 weeks   PT Treatment/Interventions Therapeutic exercise;Therapeutic activities;Manual techniques;Moist Heat;Traction;Taping;Patient/family education;Neuromuscular re-education;Functional mobility training;Electrical Stimulation;Cryotherapy   PT Next Visit Plan progress lumbopelvic stability exercises as able; continue HS stretching and manual PRN; continue traction PRN   Consulted and Agree with Plan of Care Patient        Problem List Patient Active Problem List   Diagnosis Date Noted  . PCP NOTES >>>>>>>>>>>>>>>>>>>> 05/24/2015  . Depression with anxiety 05/22/2015  . Numbness and tingling of right arm 05/22/2015  . Pain in the chest   . Chest pain 05/21/2015  . Insomnia 09/01/2014  . GERD (gastroesophageal reflux disease) 03/27/2013  . Dysphagia, pharyngoesophageal phase 03/27/2013  . Annual physical exam 06/12/2011  . Sinusitis, chronic 02/02/2011  . Osteoporosis 06/08/2010  . Fibromyalgia, pain mngmt 01/29/2009  . LIPOMA 09/30/2008  . DEPRESSION 10/29/2007  . Migraine without aura 03/04/2007  . Hypothyroidism 05/15/2006  . COLONIC POLYPS, HX OF 05/15/2006    [name] PT, OCS 07/13/2015, 2:43 PM  [facility] [address]  [place], [place], [address] Phone: [phone]   Fax:  [phone]  Name:  [name] MRN: [id number] Date of Birth: November 12, 1945

## 2015-07-15 ENCOUNTER — Ambulatory Visit: Payer: PPO | Admitting: Physical Therapy

## 2015-07-16 ENCOUNTER — Other Ambulatory Visit (HOSPITAL_COMMUNITY): Payer: Self-pay | Admitting: Orthopaedic Surgery

## 2015-07-16 DIAGNOSIS — M545 Low back pain: Secondary | ICD-10-CM

## 2015-07-19 ENCOUNTER — Ambulatory Visit: Payer: PPO | Admitting: Physical Therapy

## 2015-07-21 ENCOUNTER — Ambulatory Visit: Payer: PPO | Admitting: Physical Therapy

## 2015-07-21 DIAGNOSIS — M545 Low back pain: Secondary | ICD-10-CM | POA: Diagnosis not present

## 2015-07-21 NOTE — Therapy (Signed)
[facility] [address]  [place], [place], [address] Phone: [phone]   Fax:  [phone]  Physical Therapy Treatment  Patient Details  Name: [name] MRN: [id number] Date of Birth: 02-06-1946 Referring Provider: Leigh Aurora, MD  Encounter Date: 07/21/2015      PT End of Session - 07/21/15 1020    Visit Number 13   Number of Visits 20   Date for PT Re-Evaluation 08/24/15   PT Start Time 1018   PT Stop Time 1120   PT Time Calculation (min) 62 min      Past Medical History  Diagnosis Date  . Hypothyroidism   . Migraine   . Osteopenia   . Colonic polyp     bx adenomatous polyps, next scope 2010  . Pleuritic chest pain     etiology unclear, has beeb eval by pulmonary and rheumatology (autoimmune dz?)  . Depression   . Chest pain     stress test @ [facility] cardiology (-)  . Fibromyalgia     see rheumatology  . 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 Glaucoma     Past Surgical History  Procedure Laterality Date  . Tonsillectomy    . Right rotator cuff repair  2010  . 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  . Esophagogastroduodenoscopy (egd) with propofol N/A 03/27/2013    Procedure: ESOPHAGOGASTRODUODENOSCOPY (EGD) WITH PROPOFOL;  Surgeon: Milus Banister, MD;  Location: [facility];  Service: Endoscopy;  Laterality: N/A;  possible dil  . Plastic surgery face  2013    There were no vitals filed for this visit.      Subjective Assessment - 07/21/15 1023    Subjective Pt states she was ill most of last week but is feeling much better regarding this lately.  She states her lower back is doing okay lately stating back pain has been 4/10 lately but up to 8/10 at worst.  She c/o difficulty sleeping due to L hip pain (unsure if related to lower back or not). She is scheduled for MRI this weekend.   Currently in Pain? Yes   Pain Score 4    Pain Location Back   Pain Orientation Lower            TODAY'S TREATMENT TherEx - NuStep lvl 4, 4' Bridge 15x Stretches (performed by PT): HS, Piri, SKTC, Supine Mod Thomas RF and Hip Flexor (very tender L anterior thigh with hip flexor stretch) LTR 60" ALT Pelvic wiggle self MET 5x5" each Pelvic Tilt 10x5" Partial Curl-up 12x Side-Lying Clam Green TB 12x each Low Row 20#  Single hand low row 10# 10x each  Mechanical Traction - l-spine, prone, neutral pull, 56#/28#, 60"/20", 15'              PT Short Term Goals - 06/01/15 0840    PT SHORT TERM GOAL #1   Title pt independent with initial HEP by 05/27/15   Status Achieved           PT Long Term Goals - 07/13/15 1441    PT LONG TERM GOAL #1   Title pt independent with advanced HEP as necessary by 08/24/15   Status On-going   PT LONG TERM GOAL #2   Title pt displays B LE MMT 4+/5 or better without c/o LBP by 08/24/15  no pain with testing, met in all planes other than L hip ABD, ER, IR, and  Extension   Status Partially Met   PT LONG TERM GOAL #3   Title pt reports LBP improves to no greater than 4/10 at worst and 2/10 on AVG to allow performance of ADLs and chores without restriction by pain by 08/24/15   Status On-going               Plan - 07/21/15 1106    Clinical Impression Statement performed well today and some progressions tolerated without pain.  CC = pain and limited abliity sleeping due to pain in L hip/lower back.  She has MRI soon and will review this once complete.   PT Next Visit Plan progress lumbopelvic stability exercises as able; continue HS stretching and manual PRN; continue traction PRN   Consulted and Agree with Plan of Care Patient      Patient will benefit from skilled therapeutic intervention in order to improve the following deficits and impairments:  Pain, Postural dysfunction, Decreased strength, Decreased mobility, Impaired flexibility, Improper body mechanics  Visit Diagnosis: Bilateral low back pain, with sciatica  presence unspecified     Problem List Patient Active Problem List   Diagnosis Date Noted  . PCP NOTES >>>>>>>>>>>>>>>>>>>> 05/24/2015  . Depression with anxiety 05/22/2015  . Numbness and tingling of right arm 05/22/2015  . Pain in the chest   . Chest pain 05/21/2015  . Insomnia 09/01/2014  . GERD (gastroesophageal reflux disease) 03/27/2013  . Dysphagia, pharyngoesophageal phase 03/27/2013  . Annual physical exam 06/12/2011  . Sinusitis, chronic 02/02/2011  . Osteoporosis 06/08/2010  . Fibromyalgia, pain mngmt 01/29/2009  . LIPOMA 09/30/2008  . DEPRESSION 10/29/2007  . Migraine without aura 03/04/2007  . Hypothyroidism 05/15/2006  . COLONIC POLYPS, HX OF 05/15/2006    Chrishun Scheer PT, OCS 07/21/2015, 11:08 AM  [facility] [place], [place], [address] Phone: [phone]   Fax:  [phone]  Name: [name] MRN: [id number] Date of Birth: [birth year]

## 2015-07-22 ENCOUNTER — Ambulatory Visit: Payer: PPO | Admitting: Physical Therapy

## 2015-07-24 ENCOUNTER — Ambulatory Visit (HOSPITAL_BASED_OUTPATIENT_CLINIC_OR_DEPARTMENT_OTHER)
Admission: RE | Admit: 2015-07-24 | Discharge: 2015-07-24 | Disposition: A | Discharge status: Home / Self Care | Payer: PPO | Source: Ambulatory Visit | Attending: Orthopaedic Surgery | Admitting: Orthopaedic Surgery

## 2015-07-24 DIAGNOSIS — M4806 Spinal stenosis, lumbar region: Secondary | ICD-10-CM | POA: Diagnosis not present

## 2015-07-24 DIAGNOSIS — M47896 Other spondylosis, lumbar region: Secondary | ICD-10-CM | POA: Insufficient documentation

## 2015-07-24 DIAGNOSIS — M545 Low back pain: Secondary | ICD-10-CM | POA: Insufficient documentation

## 2015-07-27 ENCOUNTER — Ambulatory Visit: Payer: PPO | Admitting: Physical Therapy

## 2015-07-27 DIAGNOSIS — M545 Low back pain: Secondary | ICD-10-CM

## 2015-07-27 NOTE — Therapy (Signed)
[facility] [address]  [place], [place], [address] Phone: [phone]   Fax:  [phone]  Physical Therapy Treatment  Patient Details  Name: [name] MRN: [id number] Date of Birth: Sep 03, 1945 Referring Provider: Leigh Aurora, MD  Encounter Date: 07/27/2015      PT End of Session - 07/27/15 1105    Visit Number 14   Number of Visits 20   Date for PT Re-Evaluation 08/24/15   PT Start Time 1104   PT Stop Time 1158   PT Time Calculation (min) 54 min      Past Medical History  Diagnosis Date  . Hypothyroidism   . Migraine   . Osteopenia   . Colonic polyp     bx adenomatous polyps, next scope 2010  . Pleuritic chest pain     etiology unclear, has beeb eval by pulmonary and rheumatology (autoimmune dz?)  . Depression   . Chest pain     stress test @ [facility] cardiology (-)  . Fibromyalgia     see rheumatology  . 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 Glaucoma     Past Surgical History  Procedure Laterality Date  . Tonsillectomy    . Right rotator cuff repair  2010  . 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  . Esophagogastroduodenoscopy (egd) with propofol N/A 03/27/2013    Procedure: ESOPHAGOGASTRODUODENOSCOPY (EGD) WITH PROPOFOL;  Surgeon: Milus Banister, MD;  Location: [facility];  Service: Endoscopy;  Laterality: N/A;  possible dil  . Plastic surgery face  2013    There were no vitals filed for this visit.      Subjective Assessment - 07/27/15 1141    Subjective states has been noting some L anterior hip pain with ambulation (during swing phase).  Pt underwent MRI this past weekend and is to call MD for f/u regarding this.  She rates LBP 5/10 this AM and states this has been her AVG pain lately.   Diagnostic tests MRI from 07/24/15 indicates multilevel DDD and DJD with several areas of L nerve root impingement   Currently in Pain? Yes   Pain Score 5    Pain Location Back   Pain  Orientation Lower         TODAY'S TREATMENT Reviewed MRI results with pt and how PT POC addresses her issues  TherEx - Pelvic Tilt 10x5" Bridge 15x Hooklying LE March with TrA 10x3" Stretches (performed by PT): HS, Piri, SKTC, Supine Mod Thomas RF and Hip Flexor (very tender L anterior thigh with hip flexor stretch) Bridge with Black TB at knees 10x3" Bridge with unilateral Hip ABD Black TB 5x eadch Quadruped LE Ext 10x each  Mechanical Traction - l-spine, prone, neutral pull, 62#/31#, 60"/20", 8' (stopped early due to pain in R hip, unable to abolish even with reducing wt and adjusting harness)             PT Short Term Goals - 06/01/15 0840    PT SHORT TERM GOAL #1   Title pt independent with initial HEP by 05/27/15   Status Achieved           PT Long Term Goals - 07/13/15 1441    PT LONG TERM GOAL #1   Title pt independent with advanced HEP as necessary by 08/24/15   Status On-going   PT LONG TERM GOAL #2   Title pt displays B LE MMT 4+/5 or  better without c/o LBP by 08/24/15  no pain with testing, met in all planes other than L hip ABD, ER, IR, and Extension   Status Partially Met   PT LONG TERM GOAL #3   Title pt reports LBP improves to no greater than 4/10 at worst and 2/10 on AVG to allow performance of ADLs and chores without restriction by pain by 08/24/15   Status On-going               Plan - 07/27/15 1200    Clinical Impression Statement pt underwent MRI this past weekend and multilevel degenerative issues noted with possible nerve root impingements to L3, 4, 5, and S1.  Current POC focuing on neutral stability seems to be correct course of action.  Pt is to contact MD for f/u regarding MRI results.   PT Next Visit Plan progress lumbopelvic stability exercises as able; continue HS stretching and manual PRN; continue traction PRN   Consulted and Agree with Plan of Care Patient      Patient will benefit from skilled therapeutic intervention in  order to improve the following deficits and impairments:  Pain, Postural dysfunction, Decreased strength, Decreased mobility, Impaired flexibility, Improper body mechanics  Visit Diagnosis: Bilateral low back pain, with sciatica presence unspecified     Problem List Patient Active Problem List   Diagnosis Date Noted  . PCP NOTES >>>>>>>>>>>>>>>>>>>> 05/24/2015  . Depression with anxiety 05/22/2015  . Numbness and tingling of right arm 05/22/2015  . Pain in the chest   . Chest pain 05/21/2015  . Insomnia 09/01/2014  . GERD (gastroesophageal reflux disease) 03/27/2013  . Dysphagia, pharyngoesophageal phase 03/27/2013  . Annual physical exam 06/12/2011  . Sinusitis, chronic 02/02/2011  . Osteoporosis 06/08/2010  . Fibromyalgia, pain mngmt 01/29/2009  . LIPOMA 09/30/2008  . DEPRESSION 10/29/2007  . Migraine without aura 03/04/2007  . Hypothyroidism 05/15/2006  . COLONIC POLYPS, HX OF 05/15/2006    Lyndal Reggio PT, OCS 07/27/2015, 12:02 PM  [facility] [address]  [place], [place], [address] Phone: [phone]   Fax:  [phone]  Name: [name] MRN: [id number] Date of Birth: 22-Jul-1945

## 2015-07-30 ENCOUNTER — Ambulatory Visit: Payer: PPO | Admitting: Physical Therapy

## 2015-08-03 ENCOUNTER — Ambulatory Visit: Payer: PPO | Admitting: Physical Therapy

## 2015-08-03 DIAGNOSIS — M545 Low back pain: Secondary | ICD-10-CM | POA: Diagnosis not present

## 2015-08-03 NOTE — Therapy (Signed)
[facility] [address]  [place], [place], [address] Phone: [phone]   Fax:  [phone]  Physical Therapy Treatment  Patient Details  Name: [name] MRN: [id number] Date of Birth: February 24, 1946 Referring Provider: Leigh Aurora, MD  Encounter Date: 08/03/2015      PT End of Session - 08/03/15 1536    Visit Number 15   Number of Visits 20   Date for PT Re-Evaluation 08/24/15   PT Start Time 1749   PT Stop Time 1629   PT Time Calculation (min) 54 min      Past Medical History  Diagnosis Date  . Hypothyroidism   . Migraine   . Osteopenia   . Colonic polyp     bx adenomatous polyps, next scope 2010  . Pleuritic chest pain     etiology unclear, has beeb eval by pulmonary and rheumatology (autoimmune dz?)  . Depression   . Chest pain     stress test @ [facility] cardiology (-)  . Fibromyalgia     see rheumatology  . 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 Glaucoma     Past Surgical History  Procedure Laterality Date  . Tonsillectomy    . Right rotator cuff repair  2010  . 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  . Esophagogastroduodenoscopy (egd) with propofol N/A 03/27/2013    Procedure: ESOPHAGOGASTRODUODENOSCOPY (EGD) WITH PROPOFOL;  Surgeon: Milus Banister, MD;  Location: [facility];  Service: Endoscopy;  Laterality: N/A;  possible dil  . Plastic surgery face  2013    There were no vitals filed for this visit.      Subjective Assessment - 08/03/15 1537    Subjective States went on car ride to/from [place] this past weekend and has noted severe increase in LBP since this stating "its killing me". Rates pain 7/10 "at least and states pain wraps around to anterior thigh.  States noted n/t down L LE while in bed after lying down following 13 hours in car.   Currently in Pain? Yes   Pain Score 7    Pain Location Back   Pain Orientation Lower           TODAY'S TREATMENT  TherEx  - LTR 60" Stretches (performed by PT): HS, Piri, SKTC, B LE nerve glides Pelvic Tilt 10x5" Hooklying LE March with TrA 10x3" Bridge 10x Hooklying B Hip ADD [place] 10x5" Hooklying B Hip ABD Blue TB 10x5"  Manual - pt prone with pillow under belly x-hand long axis distraction and rotation; grade 2 and 3 UPA mobs to B l-spine  TherEx - POE 1' Prone Knee Flexion Stretch Prone SLR 10x each  IFC (80-'150Hz' ) to B L-spine with pt prone over pillow 20'                     PT Short Term Goals - 06/01/15 0840    PT SHORT TERM GOAL #1   Title pt independent with initial HEP by 05/27/15   Status Achieved           PT Long Term Goals - 07/13/15 1441    PT LONG TERM GOAL #1   Title pt independent with advanced HEP as necessary by 08/24/15   Status On-going   PT LONG TERM GOAL #2   Title pt displays B LE MMT 4+/5 or better without c/o LBP by 08/24/15  no pain with testing, met  in all planes other than L hip ABD, ER, IR, and Extension   Status Partially Met   PT LONG TERM GOAL #3   Title pt reports LBP improves to no greater than 4/10 at worst and 2/10 on AVG to allow performance of ADLs and chores without restriction by pain by 08/24/15   Status On-going               Plan - 08/03/15 1556    Clinical Impression Statement pt went on long car trip this past weekend to/from [place].  At conclusion of trip, noted n/t in L LE when lying in bed. Today, rates LBP 7/10 and c/o pain in L anterior thigh that feels like "pulled muscle".  Question these symptoms could be due to HNP from prolonged sitting in car.  Light exercises and manual well tolerated.  No traction today per pt request so performed IFC to l-spine with pt prone over pillow to allow HNP reduction if present.   PT Next Visit Plan progress lumbopelvic stability exercises as able; continue HS stretching and manual PRN; continue traction PRN   Consulted and Agree with Plan of Care Patient      Patient will  benefit from skilled therapeutic intervention in order to improve the following deficits and impairments:  Pain, Postural dysfunction, Decreased strength, Decreased mobility, Impaired flexibility, Improper body mechanics  Visit Diagnosis: Bilateral low back pain, with sciatica presence unspecified     Problem List Patient Active Problem List   Diagnosis Date Noted  . PCP NOTES >>>>>>>>>>>>>>>>>>>> 05/24/2015  . Depression with anxiety 05/22/2015  . Numbness and tingling of right arm 05/22/2015  . Pain in the chest   . Chest pain 05/21/2015  . Insomnia 09/01/2014  . GERD (gastroesophageal reflux disease) 03/27/2013  . Dysphagia, pharyngoesophageal phase 03/27/2013  . Annual physical exam 06/12/2011  . Sinusitis, chronic 02/02/2011  . Osteoporosis 06/08/2010  . Fibromyalgia, pain mngmt 01/29/2009  . LIPOMA 09/30/2008  . DEPRESSION 10/29/2007  . Migraine without aura 03/04/2007  . Hypothyroidism 05/15/2006  . COLONIC POLYPS, HX OF 05/15/2006    Aubriegh Minch PT, OCS 08/03/2015, 5:02 PM  [facility] [address]  [place], [place], [address] Phone: [phone]   Fax:  [phone]  Name: [name] MRN: [id number] Date of Birth: 13-Apr-1945

## 2015-08-06 ENCOUNTER — Ambulatory Visit: Payer: PPO | Admitting: Physical Therapy

## 2015-08-06 DIAGNOSIS — M545 Low back pain: Secondary | ICD-10-CM

## 2015-08-06 NOTE — Therapy (Signed)
[facility] [address]  [place], [place], [address] Phone: [phone]   Fax:  [phone]  Physical Therapy Treatment  Patient Details  Name: [name] MRN: [id number] Date of Birth: 06-26-1945 Referring Provider: Leigh Aurora, MD  Encounter Date: 08/06/2015      PT End of Session - 08/06/15 1029    Visit Number 16   Number of Visits 20   Date for PT Re-Evaluation 08/24/15   PT Start Time 1025   PT Stop Time 1100   PT Time Calculation (min) 35 min      Past Medical History  Diagnosis Date  . Hypothyroidism   . Migraine   . Osteopenia   . Colonic polyp     bx adenomatous polyps, next scope 2010  . Pleuritic chest pain     etiology unclear, has beeb eval by pulmonary and rheumatology (autoimmune dz?)  . Depression   . Chest pain     stress test @ [facility] cardiology (-)  . Fibromyalgia     see rheumatology  . 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 Glaucoma     Past Surgical History  Procedure Laterality Date  . Tonsillectomy    . Right rotator cuff repair  2010  . 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  . Esophagogastroduodenoscopy (egd) with propofol N/A 03/27/2013    Procedure: ESOPHAGOGASTRODUODENOSCOPY (EGD) WITH PROPOFOL;  Surgeon: Milus Banister, MD;  Location: [facility];  Service: Endoscopy;  Laterality: N/A;  possible dil  . Plastic surgery face  2013    There were no vitals filed for this visit.      Subjective Assessment - 08/06/15 1029    Subjective States seems to note most L hip/back pain with ambulation and is noted during stance phase on L.  Rates pain 5/10 while sitting and 7/10 while walkking.   Currently in Pain? Yes   Pain Score --  5/10 sitting, 7/10 walking   Pain Location Back   Pain Orientation Lower  L>R            TODAY'S TREATMENT TherEx - Bridge 15x Bridge with Black TB at knees and unilateral Hip ABD 2x5 each  Manual - Grade 2 AP mobs  L pelvic innominant Prone STM with stretching to L piriformis  TherEx - Prone B Hip IR Yellow TB at ankles 2x10 Prone B Hip ER Branden at ankles 2x10x3" Quadruped Hip Ext with 90 knee 10x each Quadruped dirty dog 10x each            PT Education - 08/06/15 1154    Education provided Yes   Education Details HEP update for more lumbopelvic stability   Person(s) Educated Patient   Methods Explanation;Demonstration;Handout   Comprehension Verbalized understanding;Returned demonstration          PT Short Term Goals - 06/01/15 0840    PT SHORT TERM GOAL #1   Title pt independent with initial HEP by 05/27/15   Status Achieved           PT Long Term Goals - 07/13/15 1441    PT LONG TERM GOAL #1   Title pt independent with advanced HEP as necessary by 08/24/15   Status On-going   PT LONG TERM GOAL #2   Title pt displays B LE MMT 4+/5 or better without c/o LBP by 08/24/15  no pain with testing, met in all planes other than L  hip ABD, ER, IR, and Extension   Status Partially Met   PT LONG TERM GOAL #3   Title pt reports LBP improves to no greater than 4/10 at worst and 2/10 on AVG to allow performance of ADLs and chores without restriction by pain by 08/24/15   Status On-going               Plan - 08/06/15 1155    Clinical Impression Statement Mrs. Sykora seems to making progress with regard to her pain but still present.  Today's pain noted mostly in L upper buttock and is noted with stance phase of ambulation. This would often indicate a lumbopelvic/SIJ stability issue and addressed as such today to include HEP progression.   PT Next Visit Plan progress lumbopelvic stability exercises as able; continue HS stretching and manual PRN; continue traction PRN   Consulted and Agree with Plan of Care Patient      Patient will benefit from skilled therapeutic intervention in order to improve the following deficits and impairments:  Pain, Postural dysfunction, Decreased  strength, Decreased mobility, Impaired flexibility, Improper body mechanics  Visit Diagnosis: Bilateral low back pain, with sciatica presence unspecified     Problem List Patient Active Problem List   Diagnosis Date Noted  . PCP NOTES >>>>>>>>>>>>>>>>>>>> 05/24/2015  . Depression with anxiety 05/22/2015  . Numbness and tingling of right arm 05/22/2015  . Pain in the chest   . Chest pain 05/21/2015  . Insomnia 09/01/2014  . GERD (gastroesophageal reflux disease) 03/27/2013  . Dysphagia, pharyngoesophageal phase 03/27/2013  . Annual physical exam 06/12/2011  . Sinusitis, chronic 02/02/2011  . Osteoporosis 06/08/2010  . Fibromyalgia, pain mngmt 01/29/2009  . LIPOMA 09/30/2008  . DEPRESSION 10/29/2007  . Migraine without aura 03/04/2007  . Hypothyroidism 05/15/2006  . COLONIC POLYPS, HX OF 05/15/2006    HALL,RALPH PT, OCS 08/06/2015, 11:58 AM  [facility] [place], [place], [address] Phone: [phone]   Fax:  [phone]  Name: MAR ZETTLER MRN: [id number] Date of Birth: 03-07-1946

## 2015-08-09 DIAGNOSIS — M4806 Spinal stenosis, lumbar region: Secondary | ICD-10-CM | POA: Diagnosis not present

## 2015-08-09 DIAGNOSIS — M47817 Spondylosis without myelopathy or radiculopathy, lumbosacral region: Secondary | ICD-10-CM | POA: Diagnosis not present

## 2015-08-09 DIAGNOSIS — M545 Low back pain: Secondary | ICD-10-CM | POA: Diagnosis not present

## 2015-08-10 ENCOUNTER — Ambulatory Visit: Payer: PPO | Attending: Internal Medicine | Admitting: Physical Therapy

## 2015-08-10 DIAGNOSIS — M545 Low back pain: Secondary | ICD-10-CM | POA: Diagnosis not present

## 2015-08-10 DIAGNOSIS — M25552 Pain in left hip: Secondary | ICD-10-CM | POA: Insufficient documentation

## 2015-08-10 NOTE — Therapy (Signed)
[facility] [address]  [place], [place], [address] Phone: [phone]   Fax:  [phone]  Physical Therapy Treatment  Patient Details  Name: [name] MRN: [id number] Date of Birth: 16-Sep-1945 Referring Provider: Leigh Aurora, MD  Encounter Date: 08/10/2015      PT End of Session - 08/10/15 1335    Visit Number 17   Number of Visits 20   Date for PT Re-Evaluation 08/24/15   PT Start Time 1318   PT Stop Time 1400   PT Time Calculation (min) 42 min      Past Medical History  Diagnosis Date  . Hypothyroidism   . Migraine   . Osteopenia   . Colonic polyp     bx adenomatous polyps, next scope 2010  . Pleuritic chest pain     etiology unclear, has beeb eval by pulmonary and rheumatology (autoimmune dz?)  . Depression   . Chest pain     stress test @ [facility] cardiology (-)  . Fibromyalgia     see rheumatology  . 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 Glaucoma     Past Surgical History  Procedure Laterality Date  . Tonsillectomy    . Right rotator cuff repair  2010  . 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  . Esophagogastroduodenoscopy (egd) with propofol N/A 03/27/2013    Procedure: ESOPHAGOGASTRODUODENOSCOPY (EGD) WITH PROPOFOL;  Surgeon: Milus Banister, MD;  Location: [facility];  Service: Endoscopy;  Laterality: N/A;  possible dil  . Plastic surgery face  2013    There were no vitals filed for this visit.      Subjective Assessment - 08/10/15 1320    Subjective "today it's not that bad, about a 5/10".  Pt to MD yesterday and she is being scheduled for epidural.   Currently in Pain? Yes   Pain Score 5    Pain Location Back   Pain Orientation Lower             TODAY'S TREATMENT TherEx - Pelvic rocking 10x Pelvic Wiggle MET 5x5" each Hooklying B Hip ABD Black TB 10x5" Hooklying B Hip ADD [place] 10x5" Bridge with Black TB at knees and unilateral Hip ABD 2x5 each POE  1' Prone Knee Flexion stretch  Manual - Prone STM with stretching to L piriformis  TherEx - Prone SLR 10x each Machine Low 20# 10x Single Hand Low Row 10# 10x each PBall (55cm) Wall Slide 10x Prone B Hip IR Yellow TB at ankles 2x10 Prone B Hip ER [place] at ankles 2x10x2"                       PT Short Term Goals - 06/01/15 0840    PT SHORT TERM GOAL #1   Title pt independent with initial HEP by 05/27/15   Status Achieved           PT Long Term Goals - 07/13/15 1441    PT LONG TERM GOAL #1   Title pt independent with advanced HEP as necessary by 08/24/15   Status On-going   PT LONG TERM GOAL #2   Title pt displays B LE MMT 4+/5 or better without c/o LBP by 08/24/15  no pain with testing, met in all planes other than L hip ABD, ER, IR, and Extension   Status Partially Met   PT LONG TERM GOAL #3   Title pt  reports LBP improves to no greater than 4/10 at worst and 2/10 on AVG to allow performance of ADLs and chores without restriction by pain by 08/24/15   Status On-going               Plan - 08/10/15 1404    Clinical Impression Statement Pain still chiefly noted with walking and is located in L SIJ/buttock area.  Performed some more pelvic stabliity type exercises but no immediate benefit noted. She is getting scheduled for a l-spine epidural and hopefully this will offer good benefit.   PT Next Visit Plan progress lumbopelvic stability exercises as able; continue HS stretching and manual PRN; continue traction PRN   Consulted and Agree with Plan of Care Patient      Patient will benefit from skilled therapeutic intervention in order to improve the following deficits and impairments:  Pain, Postural dysfunction, Decreased strength, Decreased mobility, Impaired flexibility, Improper body mechanics  Visit Diagnosis: Bilateral low back pain, with sciatica presence unspecified     Problem List Patient Active Problem List   Diagnosis Date Noted  .  PCP NOTES >>>>>>>>>>>>>>>>>>>> 05/24/2015  . Depression with anxiety 05/22/2015  . Numbness and tingling of right arm 05/22/2015  . Pain in the chest   . Chest pain 05/21/2015  . Insomnia 09/01/2014  . GERD (gastroesophageal reflux disease) 03/27/2013  . Dysphagia, pharyngoesophageal phase 03/27/2013  . Annual physical exam 06/12/2011  . Sinusitis, chronic 02/02/2011  . Osteoporosis 06/08/2010  . Fibromyalgia, pain mngmt 01/29/2009  . LIPOMA 09/30/2008  . DEPRESSION 10/29/2007  . Migraine without aura 03/04/2007  . Hypothyroidism 05/15/2006  . COLONIC POLYPS, HX OF 05/15/2006    Auriella Wieand PT, OCS 08/10/2015, 2:06 PM  [facility] [place], [place], [address] Phone: [phone]   Fax:  [phone]  Name: [name] MRN: [id number] Date of Birth: 1945-06-03

## 2015-08-12 ENCOUNTER — Ambulatory Visit: Payer: PPO | Admitting: Physical Therapy

## 2015-08-12 DIAGNOSIS — M545 Low back pain: Secondary | ICD-10-CM | POA: Diagnosis not present

## 2015-08-12 NOTE — Therapy (Signed)
[facility] [address]  [place], [place], [address] Phone: [phone]   Fax:  [phone]  Physical Therapy Treatment  Patient Details  Name: LARAMIE GELLES MRN: [id number] Date of Birth: 1946/01/31 Referring Provider: Leigh Aurora, MD  Encounter Date: 08/12/2015      PT End of Session - 08/12/15 1026    Visit Number 18   Number of Visits 20   Date for PT Re-Evaluation 08/24/15   PT Start Time 1020   PT Stop Time 1103   PT Time Calculation (min) 43 min      Past Medical History  Diagnosis Date  . Hypothyroidism   . Migraine   . Osteopenia   . Colonic polyp     bx adenomatous polyps, next scope 2010  . Pleuritic chest pain     etiology unclear, has beeb eval by pulmonary and rheumatology (autoimmune dz?)  . Depression   . Chest pain     stress test @ [facility] cardiology (-)  . Fibromyalgia     see rheumatology  . 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 Glaucoma     Past Surgical History  Procedure Laterality Date  . Tonsillectomy    . Right rotator cuff repair  2010  . 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  . Esophagogastroduodenoscopy (egd) with propofol N/A 03/27/2013    Procedure: ESOPHAGOGASTRODUODENOSCOPY (EGD) WITH PROPOFOL;  Surgeon: Milus Banister, MD;  [facility]: [facility];  Service: Endoscopy;  Laterality: N/A;  possible dil  . Plastic surgery face  2013    There were no vitals filed for this visit.      Subjective Assessment - 08/12/15 1026    Subjective states was aching after last treatment and had to lie down following treatment. States after got up felt better and has been feeling good since that time. Rates pain 3/10 today.   Currently in Pain? Yes   Pain Score 3    Pain Location Back   Pain Orientation Lower          TODAY'S TREATMENT TherEx - B FOB (55cm) Tuck AROM 10x B FOB (55cm) LTR 10x Pelvic Wiggle MET 5x5" each Stretch B SKTC, HS, Piri, LE Nerve  Glides Bridge 12x Bridge with Blue TB at knees 10x3"  Manual - pt R side-lying STM L piri and lateral glutes; Strumming to L ITB  TherEx - R side-lying L Clam 10x3" R side-lying L Hip ABD 6x TRX Lateral Lunge 5x each TRX DL Squat 10x Standing at sink in/out of Trendelenburg for Hip ABD strengthening 120 each Machine Low Row 25# 12              PT Short Term Goals - 06/01/15 0840    PT SHORT TERM GOAL #1   Title pt independent with initial HEP by 05/27/15   Status Achieved           PT Long Term Goals - 07/13/15 1441    PT LONG TERM GOAL #1   Title pt independent with advanced HEP as necessary by 08/24/15   Status On-going   PT LONG TERM GOAL #2   Title pt displays B LE MMT 4+/5 or better without c/o LBP by 08/24/15  no pain with testing, met in all planes other than L hip ABD, ER, IR, and Extension   Status Partially Met   PT LONG TERM GOAL #3   Title pt reports  LBP improves to no greater than 4/10 at worst and 2/10 on AVG to allow performance of ADLs and chores without restriction by pain by 08/24/15   Status On-going               Plan - 08/12/15 1148    Clinical Impression Statement sore after last treatment but after that decreased she states her pain has been down to 3/10.  today's treatment similar to last working on lumbopelvic stability especially to L pelvic innominant.  Seemed well tolerated without c/o increased pain.   PT Next Visit Plan progress lumbopelvic stability exercises as able; continue HS stretching and manual PRN; continue traction PRN   Consulted and Agree with Plan of Care Patient      Patient will benefit from skilled therapeutic intervention in order to improve the following deficits and impairments:  Pain, Postural dysfunction, Decreased strength, Decreased mobility, Impaired flexibility, Improper body mechanics  Visit Diagnosis: Bilateral low back pain, with sciatica presence unspecified     Problem List Patient Active  Problem List   Diagnosis Date Noted  . PCP NOTES >>>>>>>>>>>>>>>>>>>> 05/24/2015  . Depression with anxiety 05/22/2015  . Numbness and tingling of right arm 05/22/2015  . Pain in the chest   . Chest pain 05/21/2015  . Insomnia 09/01/2014  . GERD (gastroesophageal reflux disease) 03/27/2013  . Dysphagia, pharyngoesophageal phase 03/27/2013  . Annual physical exam 06/12/2011  . Sinusitis, chronic 02/02/2011  . Osteoporosis 06/08/2010  . Fibromyalgia, pain mngmt 01/29/2009  . LIPOMA 09/30/2008  . DEPRESSION 10/29/2007  . Migraine without aura 03/04/2007  . Hypothyroidism 05/15/2006  . COLONIC POLYPS, HX OF 05/15/2006    Deveney Bayon PT, OCS 08/12/2015, 11:54 AM  [facility] [address]  [place], [place], [address] Phone: [phone]   Fax:  [phone]  Name: SKYAH HANNON MRN: [id number] Date of Birth: 03-Jun-1945

## 2015-08-16 ENCOUNTER — Ambulatory Visit: Payer: PPO | Admitting: Physical Therapy

## 2015-08-16 DIAGNOSIS — M545 Low back pain: Secondary | ICD-10-CM

## 2015-08-16 NOTE — Therapy (Signed)
[facility] [address]  [place], [place], [address] Phone: [phone]   Fax:  [phone]  Physical Therapy Treatment  Patient Details  Name: [name] MRN: [id number] Date of Birth: October 28, 1945 Referring Provider: Leigh Aurora, MD  Encounter Date: 08/16/2015      PT End of Session - 08/16/15 1320    Visit Number 19   Number of Visits 20   Date for PT Re-Evaluation 08/24/15   PT Start Time 0300   PT Stop Time 1406   PT Time Calculation (min) 48 min      Past Medical History  Diagnosis Date  . Hypothyroidism   . Migraine   . Osteopenia   . Colonic polyp     bx adenomatous polyps, next scope 2010  . Pleuritic chest pain     etiology unclear, has beeb eval by pulmonary and rheumatology (autoimmune dz?)  . Depression   . Chest pain     stress test @ [facility] cardiology (-)  . Fibromyalgia     see rheumatology  . 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 Glaucoma     Past Surgical History  Procedure Laterality Date  . Tonsillectomy    . Right rotator cuff repair  2010  . 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  . Esophagogastroduodenoscopy (egd) with propofol N/A 03/27/2013    Procedure: ESOPHAGOGASTRODUODENOSCOPY (EGD) WITH PROPOFOL;  Surgeon: Milus Banister, MD;  [facility]: [facility];  Service: Endoscopy;  Laterality: N/A;  possible dil  . Plastic surgery face  2013    There were no vitals filed for this visit.      Subjective Assessment - 08/16/15 1320    Subjective States was feeling good over th weekend and decided to work in the yard some.  States this increased her pain up to 8/10 but able to get it back down to 4/10 with resting.  Currently, pain 5/10 stating thinks due to sleeping on L side last night.  She has been scheduled for l-spine epidural on 08/25/15.   Currently in Pain? Yes   Pain Score 5    Pain Location Back   Pain Orientation Lower;Left          TODAY'S  TREATMENT TherEx - Pelvic Wiggle MET 5x5" each Stretch B SKTC, HS, Piri Bridge 10x3" Pelvic Tilt 10x3" Partial Curl-up 7x  Manual - pt R side-lying STM L piri and lateral glutes; Strumming to L ITB  TherEx - R side-lying L Clam 12x3" TRX DL Squat 12x TRX Lateral Lunge 8x each (more difficult on L LE with pt experiencing LOB 3x on Left) Single Hand Low Row Machine 15# 10x each Side stepping with Yellow TB at ankles at counter 3 laps (shorter steps to the L, no c/o pain)           PT Short Term Goals - 06/01/15 0840    PT SHORT TERM GOAL #1   Title pt independent with initial HEP by 05/27/15   Status Achieved           PT Long Term Goals - 07/13/15 1441    PT LONG TERM GOAL #1   Title pt independent with advanced HEP as necessary by 08/24/15   Status On-going   PT LONG TERM GOAL #2   Title pt displays B LE MMT 4+/5 or better without c/o LBP by 08/24/15  no pain with testing, met in all planes  other than L hip ABD, ER, IR, and Extension   Status Partially Met   PT LONG TERM GOAL #3   Title pt reports LBP improves to no greater than 4/10 at worst and 2/10 on AVG to allow performance of ADLs and chores without restriction by pain by 08/24/15   Status On-going               Plan - 08/16/15 1410    Clinical Impression Statement Worked in the yard over the weekend and noted 8/10 pain with this; down to 4/10 with rest later that day. 5/10 pain today with pt stating pain due to lying on L side last night while sleeping. Weakness noted in L hip with several of the standing exercises today but no c/o pain with these.  Pt has scheduled her l-spine epidural for next week.   PT Next Visit Plan re-assess for PR   Consulted and Agree with Plan of Care Patient      Patient will benefit from skilled therapeutic intervention in order to improve the following deficits and impairments:  Pain, Postural dysfunction, Decreased strength, Decreased mobility, Impaired flexibility,  Improper body mechanics  Visit Diagnosis: Bilateral low back pain, with sciatica presence unspecified     Problem List Patient Active Problem List   Diagnosis Date Noted  . PCP NOTES >>>>>>>>>>>>>>>>>>>> 05/24/2015  . Depression with anxiety 05/22/2015  . Numbness and tingling of right arm 05/22/2015  . Pain in the chest   . Chest pain 05/21/2015  . Insomnia 09/01/2014  . GERD (gastroesophageal reflux disease) 03/27/2013  . Dysphagia, pharyngoesophageal phase 03/27/2013  . Annual physical exam 06/12/2011  . Sinusitis, chronic 02/02/2011  . Osteoporosis 06/08/2010  . Fibromyalgia, pain mngmt 01/29/2009  . LIPOMA 09/30/2008  . DEPRESSION 10/29/2007  . Migraine without aura 03/04/2007  . Hypothyroidism 05/15/2006  . COLONIC POLYPS, HX OF 05/15/2006    Nitza Schmid PT, OCS 08/16/2015, 2:12 PM  [facility] [address]  [place], [place], [address] Phone: [phone]   Fax:  [phone]  Name: [name] MRN: [id number] Date of Birth: Jan 06, 1946

## 2015-08-19 ENCOUNTER — Ambulatory Visit: Payer: PPO | Admitting: Physical Therapy

## 2015-08-19 DIAGNOSIS — M545 Low back pain: Secondary | ICD-10-CM

## 2015-08-19 NOTE — Therapy (Signed)
[facility] [address]  [place], [place], [address] Phone: [phone]   Fax:  [phone]  Physical Therapy Treatment  Patient Details  Name: [name] MRN: [id number] Date of Birth: 02/22/1946 Referring Provider: Leigh Aurora, MD  Encounter Date: 08/19/2015      PT End of Session - 08/19/15 1031    Visit Number 20   Number of Visits 20   PT Start Time 1324   PT Stop Time 1103   PT Time Calculation (min) 36 min      Past Medical History  Diagnosis Date  . Hypothyroidism   . Migraine   . Osteopenia   . Colonic polyp     bx adenomatous polyps, next scope 2010  . Pleuritic chest pain     etiology unclear, has beeb eval by pulmonary and rheumatology (autoimmune dz?)  . Depression   . Chest pain     stress test @ [facility] cardiology (-)  . Fibromyalgia     see rheumatology  . 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 Glaucoma     Past Surgical History  Procedure Laterality Date  . Tonsillectomy    . Right rotator cuff repair  2010  . 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  . Esophagogastroduodenoscopy (egd) with propofol N/A 03/27/2013    Procedure: ESOPHAGOGASTRODUODENOSCOPY (EGD) WITH PROPOFOL;  Surgeon: Milus Banister, MD;  Location: [facility];  Service: Endoscopy;  Laterality: N/A;  possible dil  . Plastic surgery face  2013    There were no vitals filed for this visit.      Subjective Assessment - 08/19/15 1027    Subjective states had difficulty shopping yesterday due to back and L hip pain; today c/o R buttock pain greater than L which she noted with driving this AM. States R LE pain extends anterior thigh to knee (same as does on L typically).   Currently in Pain? Yes   Pain Score 5    Pain Location Back   Pain Orientation Lower              TODAY'S TREATMENT TherEx - Stretch B HS, Piri Bridge with Manganelli between knees for ADD Isometric 10x3" Bridge with Blue TB at  knees 10x3" Quadruped LE Extension 10x each POE 2'  Manual - pt prone: grade 2 CPA to lower and working into mid and upper L-spine but poorly tolerated (noted increased R hip pain with mid l-spine mobs).  Backed down to grade 1 and included some gentle STM to the area although no tenderness noted in paraspinals. Prone femoral nerve glides.                          PT Short Term Goals - 06/01/15 0840    PT SHORT TERM GOAL #1   Title pt independent with initial HEP by 05/27/15   Status Achieved           PT Long Term Goals - 07/13/15 1441    PT LONG TERM GOAL #1   Title pt independent with advanced HEP as necessary by 08/24/15   Status On-going   PT LONG TERM GOAL #2   Title pt displays B LE MMT 4+/5 or better without c/o LBP by 08/24/15  no pain with testing, met in all planes other than L hip ABD, ER, IR, and Extension   Status Partially Met  PT LONG TERM GOAL #3   Title pt reports LBP improves to no greater than 4/10 at worst and 2/10 on AVG to allow performance of ADLs and chores without restriction by pain by 08/24/15   Status On-going               Plan - September 09, 2015 1154    Clinical Impression Statement [name] arrives with new c/o Right lower back pain which she states extends into R anterior thigh at times.  Today was pt's 20th visit/treatment. She has made some progress but still pain limits level of function. She is scheduled for l-spine epidural next week and plan is to see her here the following week to re-assess and make PT recommendations at that point.  Today's treatment with focus on trying to reduce her symptoms - some improvement reported but still pain.   PT Next Visit Plan re-assess for PR   Consulted and Agree with Plan of Care Patient      Patient will benefit from skilled therapeutic intervention in order to improve the following deficits and impairments:  Pain, Postural dysfunction, Decreased strength, Decreased mobility, Impaired  flexibility, Improper body mechanics  Visit Diagnosis: Bilateral low back pain, with sciatica presence unspecified       G-Codes - 09/09/15 1158    Functional Assessment Tool Used Foto 45% limitation   Functional Limitation Mobility: Walking and moving around   Mobility: Walking and Moving Around Current Status [phone]) At least 40 percent but less than 60 percent impaired, limited or restricted   Mobility: Walking and Moving Around Goal Status [phone]) At least 20 percent but less than 40 percent impaired, limited or restricted      Problem List Patient Active Problem List   Diagnosis Date Noted  . PCP NOTES >>>>>>>>>>>>>>>>>>>> 05/24/2015  . Depression with anxiety 05/22/2015  . Numbness and tingling of right arm 05/22/2015  . Pain in the chest   . Chest pain 05/21/2015  . Insomnia 09/01/2014  . GERD (gastroesophageal reflux disease) 03/27/2013  . Dysphagia, pharyngoesophageal phase 03/27/2013  . Annual physical exam 06/12/2011  . Sinusitis, chronic 02/02/2011  . Osteoporosis 06/08/2010  . Fibromyalgia, pain mngmt 01/29/2009  . LIPOMA 09/30/2008  . DEPRESSION 10/29/2007  . Migraine without aura 03/04/2007  . Hypothyroidism 05/15/2006  . COLONIC POLYPS, HX OF 05/15/2006    [name] PT, OCS 09-Sep-2015, 11:59 AM  [facility] [address]  [place], [place], [address] Phone: [phone]   Fax:  [phone]  Name: [name] MRN: [id number] Date of Birth: 1946-02-22

## 2015-08-24 ENCOUNTER — Ambulatory Visit: Payer: PPO | Admitting: Physical Therapy

## 2015-08-25 DIAGNOSIS — M5116 Intervertebral disc disorders with radiculopathy, lumbar region: Secondary | ICD-10-CM | POA: Diagnosis not present

## 2015-08-25 DIAGNOSIS — M5416 Radiculopathy, lumbar region: Secondary | ICD-10-CM | POA: Diagnosis not present

## 2015-08-26 ENCOUNTER — Ambulatory Visit: Payer: PPO | Admitting: Physical Therapy

## 2015-08-27 ENCOUNTER — Ambulatory Visit: Payer: PPO | Admitting: Physical Therapy

## 2015-08-31 ENCOUNTER — Ambulatory Visit: Payer: PPO | Admitting: Physical Therapy

## 2015-09-01 ENCOUNTER — Other Ambulatory Visit: Payer: Self-pay | Admitting: Internal Medicine

## 2015-09-01 MED ORDER — HYDROCODONE-ACETAMINOPHEN 5-325 MG PO TABS: 1.0000 | ORAL_TABLET | Freq: Four times a day (QID) | ORAL | Status: DC | PRN

## 2015-09-01 NOTE — Telephone Encounter (Signed)
Okay #300, no refill Will reassess the need for high doses of hydrocodone at next visit

## 2015-09-01 NOTE — Telephone Encounter (Signed)
Pt is requesting refill on Hydrocodone.  Last OV: 05/24/2015 Last Fill: 06/07/2015 #300 and 0RF Pt sig: 1 tablet q6h PRN UDS: 09/01/2014 Low risk  Please advise.

## 2015-09-01 NOTE — Telephone Encounter (Signed)
Rx placed at front desk for pick up at Pt's convenience. Pt informed via MyChart.

## 2015-09-01 NOTE — Telephone Encounter (Signed)
Rx printed, awaiting MD signature.  

## 2015-09-01 NOTE — Therapy (Signed)
[facility] [address]  [place], [place], [address] Phone: [phone]   Fax:  [phone]  Patient Details  Name: [name] MRN: [id number] Date of Birth: 03/02/1946 Referring Provider:  Hennie Duos, MD  Encounter Date: 08/31/2015   No treatment, note entered by error.   [facility] 09/01/2015, 7:12 AM  [facility] [address]  [place], [place], [address] Phone: [phone]   Fax:  [phone]

## 2015-09-02 ENCOUNTER — Ambulatory Visit: Payer: PPO | Admitting: Physical Therapy

## 2015-09-02 DIAGNOSIS — M25552 Pain in left hip: Secondary | ICD-10-CM

## 2015-09-02 DIAGNOSIS — Z79899 Other long term (current) drug therapy: Secondary | ICD-10-CM | POA: Diagnosis not present

## 2015-09-02 DIAGNOSIS — M545 Low back pain: Secondary | ICD-10-CM | POA: Diagnosis not present

## 2015-09-02 DIAGNOSIS — Z79891 Long term (current) use of opiate analgesic: Secondary | ICD-10-CM | POA: Diagnosis not present

## 2015-09-02 NOTE — Therapy (Signed)
[facility] [address]  [place], [place], [address] Phone: [phone]   Fax:  [phone]  Physical Therapy Treatment  Patient Details  Name: [name] MRN: [id number] Date of Birth: 10-Jun-1945 Referring Provider: Leigh Aurora, MD  Encounter Date: 09/02/2015      PT End of Session - 09/02/15 1529    Visit Number 21   Number of Visits 27   Date for PT Re-Evaluation 10/14/15   PT Start Time [id number]   PT Stop Time 1620   PT Time Calculation (min) 42 min            Subjective Assessment - 09/02/15 1529    Subjective States her pain is noted lateral L hip today and notes increased pain each time heel strikes ground which she rates 4/10 currently.  Also notes pain with sit->stand transfers and pivoting on L LE.  States had noted large increase in pain in this area (up to 8/10) following epidural on 08/25/15 but states pain noted in her lower back has improved.   Currently in Pain? Yes   Pain Score 4    Pain Location Hip   Pain Orientation Lateral;Left           TODAY'S TREATMENT  Manual - Dry Needling (verbal informed consent provided prior to treatment) performed to Left lateral buttock (primarily focusing on Glute Medius) with pt prone.  With needles in position performed several 20" bouts of Premod E-stim (10-20Hz ) to this area. Following treatment, pt reported decreased pain with wt bearing on Left LE. Kinesiology Tape - applied Star technique with 75% stretch throughout L lateral buttock superior and posterior to GT.           PT Education - 09/02/15 1623    Education provided Yes   Education Details HEP update   Person(s) Educated Patient   Methods Explanation;Demonstration;Handout   Comprehension Verbalized understanding          PT Short Term Goals - 06/01/15 0840    PT SHORT TERM GOAL #1   Title pt independent with initial HEP by 05/27/15   Status Achieved           PT  Long Term Goals - 09/02/15 1500    PT LONG TERM GOAL #1   Title pt independent with advanced HEP as necessary by 10/14/15   Status On-going   PT LONG TERM GOAL #2   Title pt displays B LE MMT 4+/5 or better without c/o LBP or hip pain by 10/14/15   Status On-going   PT LONG TERM GOAL #3   Title pt reports LBP improves to no greater than 4/10 at worst and 2/10 on AVG to allow performance of ADLs and chores without restriction by pain by 10/14/15   Status On-going               Plan - 09/02/15 1538    Clinical Impression Statement [name] underwent epidural to l-spine last week and she reports noting some decreased pain in her lower back but states pain in lateral L hip has increased.  She states this pain was up to 8/10 past couple days but has calmed down to 4/10 currently.  Pain is increased with pivoting on L LE, with heel strike during ambulation, and with sit-stand transfers.  Her current pain certainly seems more related to hip soft tissue pathology rather than l-spine and is likely Glute injury as noted on past CT.  With this in mind, performed dry needling to this area today along with kinesiology tape application.  She noted decreased pain immediately following treatment and a phone call with her 20 hours after treatment she reports approx 30% reduction in pain.  Due to this benefit, I am recommending [name] continues with Dry Needling intervention along with any stability training necessary to assist with further symptom resolution.  Unfortunately, I will no longer be able to care for [name] as today is my last day working for [employer], so she is going to transfer to our [place] location in order to continue with her POC.  I am recommending up to 6 more weeks at 1x/wk to 1x/ every other week for treatments while she performs HEP.   PT Frequency 1x / week   PT Duration 6 weeks   PT Treatment/Interventions Therapeutic exercise;Therapeutic activities;Manual techniques;Moist  Heat;Traction;Taping;Patient/family education;Neuromuscular re-education;Functional mobility training;Electrical Stimulation;Cryotherapy;Dry needling   PT Next Visit Plan Dry Needling L glutes/hip.  Hip and Lumbopelvic stability training    Consulted and Agree with Plan of Care Patient      Patient will benefit from skilled therapeutic intervention in order to improve the following deficits and impairments:  Pain, Postural dysfunction, Decreased strength, Decreased mobility, Impaired flexibility, Improper body mechanics  Visit Diagnosis: Bilateral low back pain, with sciatica presence unspecified  Pain in left hip      [name] PT, OCS 09/03/2015, 12:07 PM  [facility] [address]  [place], [place], [address] Phone: [phone]   Fax:  [phone]  Name: [name] MRN: [id number] Date of Birth: [birth year]

## 2015-09-07 DIAGNOSIS — M47817 Spondylosis without myelopathy or radiculopathy, lumbosacral region: Secondary | ICD-10-CM | POA: Diagnosis not present

## 2015-09-07 DIAGNOSIS — M5416 Radiculopathy, lumbar region: Secondary | ICD-10-CM | POA: Diagnosis not present

## 2015-09-07 DIAGNOSIS — M5116 Intervertebral disc disorders with radiculopathy, lumbar region: Secondary | ICD-10-CM | POA: Diagnosis not present

## 2015-09-08 ENCOUNTER — Telehealth: Payer: Self-pay | Admitting: Acute Care

## 2015-09-08 ENCOUNTER — Encounter: Payer: Self-pay | Admitting: Internal Medicine

## 2015-09-08 ENCOUNTER — Ambulatory Visit (INDEPENDENT_AMBULATORY_CARE_PROVIDER_SITE_OTHER): Payer: PPO | Admitting: Internal Medicine

## 2015-09-08 VITALS — BP 120/78 | HR 67 | Temp 98.7°F | Ht 66.0 in | Wt 173.0 lb

## 2015-09-08 DIAGNOSIS — Z09 Encounter for follow-up examination after completed treatment for conditions other than malignant neoplasm: Secondary | ICD-10-CM

## 2015-09-08 DIAGNOSIS — Z0000 Encounter for general adult medical examination without abnormal findings: Secondary | ICD-10-CM

## 2015-09-08 DIAGNOSIS — E039 Hypothyroidism, unspecified: Secondary | ICD-10-CM

## 2015-09-08 DIAGNOSIS — Z23 Encounter for immunization: Secondary | ICD-10-CM | POA: Diagnosis not present

## 2015-09-08 DIAGNOSIS — Z1159 Encounter for screening for other viral diseases: Secondary | ICD-10-CM

## 2015-09-08 LAB — HEPATITIS C ANTIBODY: HCV Ab: NEGATIVE

## 2015-09-08 LAB — TSH: TSH: 1.21 u[IU]/mL (ref 0.35–4.50)

## 2015-09-08 NOTE — Assessment & Plan Note (Addendum)
TD 08-2015; Pneumonia shot 2006 and 2013;  prevnar 2015;  Had a shingles immunization   Cscope 11-2005:  polyps-adenomatous, repeated  colonoscopy 11/2008 and 04-2012, next in 5 years.  Female care per gyn, had a (-) MMG 12-2014   Palpable aorta-- US neg for AAA 07-2012 diet and exercise discussed  Hep C screening per patient request

## 2015-09-08 NOTE — Progress Notes (Signed)
Subjective:    Patient ID: [name], female    DOB: [birth year], 70 y.o.   MRN: [id number]  DOS:  09/08/2015 Type of visit - description : CPX Interval history: Doing well, no major concerns    Review of Systems Constitutional: No fever. No chills. No unexplained wt changes. No unusual sweats  HEENT: No dental problems, no ear discharge, no facial swelling, no voice changes. No eye discharge, no eye  redness , no  intolerance to light   Respiratory: No wheezing , no  difficulty breathing. No cough , no mucus production  Cardiovascular: No CP, no leg swelling , no  Palpitations  GI: no nausea, no vomiting, no diarrhea , no  abdominal pain.  No blood in the stools. No dysphagia, no odynophagia    Endocrine: No polyphagia, no polyuria , no polydipsia  GU: No dysuria, gross hematuria, difficulty urinating. No urinary urgency, no frequency.  Musculoskeletal: No joint swellings or unusual aches or pains  Skin: No change in the color of the skin, palor , no  Rash  Allergic, immunologic: No environmental allergies , no  food allergies  Neurological: No dizziness no  syncope. No headaches. No diplopia, no slurred, no slurred speech, no motor deficits, no facial  Numbness  Hematological: No enlarged lymph nodes, no easy bruising , no unusual bleedings  Psychiatry: No suicidal ideas, no hallucinations, no beavior problems, no confusion.  No unusual/severe anxiety, no depression    Past Medical History  Diagnosis Date  . Hypothyroidism   . Migraine   . Osteopenia   . Colonic polyp     bx adenomatous polyps, next scope 2010  . Pleuritic chest pain     etiology unclear, has beeb eval by pulmonary and rheumatology (autoimmune dz?)  . Depression   . Chest pain     stress test @ [facility] cardiology (-)  . Fibromyalgia     see rheumatology  . 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 Glaucoma     Past Surgical History  Procedure Laterality Date  . Tonsillectomy    .  Right rotator cuff repair  2010  . 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  . Esophagogastroduodenoscopy (egd) with propofol N/A 03/27/2013    Procedure: ESOPHAGOGASTRODUODENOSCOPY (EGD) WITH PROPOFOL;  Surgeon: Milus Banister, MD;  Location: [facility];  Service: Endoscopy;  Laterality: N/A;  possible dil  . Plastic surgery face  2013    Social History   Social History  . Marital Status: Married    Spouse Name: N/A  . Number of Children: 0  . Years of Education: N/A   Occupational History  . retired, Medical sales representative work    Social History Main Topics  . Smoking status: Former Smoker -- 1.00 packs/day for 38.5 years    Types: Cigarettes    Quit date: 01/13/2004  . Smokeless tobacco: Never Used     Comment: quit 2005  . Alcohol Use: 0.0 oz/week    0 Standard drinks or equivalent per week     Comment: rare  . Drug Use: No  . Sexual Activity: Not Currently   Other Topics Concern  . Not on file   Social History Narrative   No biological children           Family History  Problem Relation Age of Onset  . Adopted: Yes  . Colon cancer Neg Hx   . Cancer Neg Hx   . Depression Neg Hx   .  Diabetes Neg Hx   . Stroke Neg Hx   . Hypertension Neg Hx   . Heart disease Neg Hx        Medication List       This list is accurate as of: 09/08/15 12:14 PM.  Always use your most recent med list.               ALPRAZolam 0.5 MG tablet  Commonly known as:  XANAX  Take 1 tablet (0.5 mg total) by mouth at bedtime as needed for anxiety.     aspirin 81 MG chewable tablet  Chew 1 tablet (81 mg total) by mouth daily.     calcium carbonate 1250 (500 Ca) MG chewable tablet  Commonly known as:  OS-CAL  Chew 1 tablet by mouth daily.     conjugated estrogens vaginal cream  Commonly known as:  PREMARIN  Place 1 Applicatorful vaginally 2 (two) times a week.     HYDROcodone-acetaminophen 5-325 MG tablet  Commonly known as:  NORCO/VICODIN  Take 1 tablet by mouth every 6 (six) hours  as needed for severe pain.     levothyroxine 88 MCG tablet  Commonly known as:  SYNTHROID, LEVOTHROID  Take 1 tablet (88 mcg total) by mouth daily before breakfast.     multivitamin capsule  Take 1 capsule by mouth daily.     polycarbophil 625 MG tablet  Commonly known as:  FIBERCON  Take 625 mg by mouth daily.     RESTASIS 0.05 % ophthalmic emulsion  Generic drug:  cycloSPORINE  Place 1 drop into both eyes 2 (two) times daily.     Vitamin D3 1000 units Caps  Take 1,200 Units by mouth.           Objective:   Physical Exam BP 120/78 mmHg  Pulse 67  Temp(Src) 98.7 F (37.1 C) (Oral)  Ht 5\' 6"  (1.676 m)  Wt 173 lb (78.472 kg)  BMI 27.94 kg/m2  SpO2 97% General:   Well developed, well nourished . NAD.  Neck: No  thyromegaly  HEENT:  Normocephalic . Face symmetric, atraumatic Lungs:  CTA B Normal respiratory effort, no intercostal retractions, no accessory muscle use. Heart: RRR,  no murmur.  No pretibial edema bilaterally  Abdomen:  Not distended, soft, non-tender. No rebound or rigidity.   Skin: Exposed areas without rash. Not pale. Not jaundice Neurologic:  alert & oriented X3.  Speech normal, gait appropriate for age and unassisted Strength symmetric and appropriate for age.  Psych: Cognition and judgment appear intact.  Cooperative with normal attention span and concentration.  Behavior appropriate. No anxious or depressed appearing.     Assessment & Plan:   Assessment Hypothyroidism GERD Migraines insmonia-- xanax prn MSK- Pain mngmt -- back pain, 2017 had a MRI, saw Dr Durward Fortes, Rx local injection, PT Osteopenia:  Used ot see rheumatology T score 2011   -2.6 (intolerant to fosamax, ok w/ Boniva x years) T score 01-2013   -2.6 T score 02-2015  -2.3, rx ca and cit D  Glaucoma Ocular Rosacea 2010 Lung cancer screening CT 09-2014, negative, repeat 1 year H/o Fibromyalgia CP admitted, stress lest low risk 05-2015 H/o Pleuritic chest pain,  on-off, resolved    PLAN: Hypothyroidism: Check a TSH Osteopenia: Currently on Ca/Vit D, recommend to stay physically active Insomnia: On Xanax as needed. MSK, pain mngmt: UDS recently done, takes on average  3 tablets of Vicodin daily. Had a MRI and saw orthopedic surgery this year, they Rx PT,  local injections RTC 6 months

## 2015-09-08 NOTE — Progress Notes (Signed)
Pre visit review using our clinic review tool, if applicable. No additional management support is needed unless otherwise documented below in the visit note. 

## 2015-09-08 NOTE — Assessment & Plan Note (Signed)
Hypothyroidism: Check a TSH Osteopenia: Currently on Ca/Vit D, recommend to stay physically active Insomnia: On Xanax as needed. MSK, pain mngmt: UDS recently done, takes on average  3 tablets of Vicodin daily. Had a MRI and saw orthopedic surgery this year, they Rx PT, local injections RTC 6 months

## 2015-09-08 NOTE — Patient Instructions (Signed)
Get your blood work before you leave   Next visit in 6 months     Fall Prevention and [place] cause injuries and can affect all age groups. It is possible to use preventive measures to significantly decrease the likelihood of falls. There are many simple measures which can make your home safer and prevent falls. OUTDOORS  Repair cracks and edges of walkways and driveways.  Remove high doorway thresholds.  Trim shrubbery on the main path into your home.  Have good outside lighting.  Clear walkways of tools, rocks, debris, and clutter.  Check that handrails are not broken and are securely fastened. Both sides of steps should have handrails.  Have leaves, snow, and ice cleared regularly.  Use sand or salt on walkways during winter months.  In the garage, clean up grease or oil spills. BATHROOM  Install night lights.  Install grab bars by the toilet and in the tub and shower.  Use non-skid mats or decals in the tub or shower.  Place a plastic non-slip stool in the shower to sit on, if needed.  Keep floors dry and clean up all water on the floor immediately.  Remove soap buildup in the tub or shower on a regular basis.  Secure bath mats with non-slip, double-sided rug tape.  Remove throw rugs and tripping hazards from the floors. BEDROOMS  Install night lights.  Make sure a bedside light is easy to reach.  Do not use oversized bedding.  Keep a telephone by your bedside.  Have a firm chair with side arms to use for getting dressed.  Remove throw rugs and tripping hazards from the floor. KITCHEN  Keep handles on pots and pans turned toward the center of the stove. Use back burners when possible.  Clean up spills quickly and allow time for drying.  Avoid walking on wet floors.  Avoid hot utensils and knives.  Position shelves so they are not too high or low.  Place commonly used objects within easy reach.  If necessary, use a sturdy step stool  with a grab bar when reaching.  Keep electrical cables out of the way.  Do not use floor polish or wax that makes floors slippery. If you must use wax, use non-skid floor wax.  Remove throw rugs and tripping hazards from the floor. STAIRWAYS  Never leave objects on stairs.  Place handrails on both sides of stairways and use them. Fix any loose handrails. Make sure handrails on both sides of the stairways are as long as the stairs.  Check carpeting to make sure it is firmly attached along stairs. Make repairs to worn or loose carpet promptly.  Avoid placing throw rugs at the top or bottom of stairways, or properly secure the rug with carpet tape to prevent slippage. Get rid of throw rugs, if possible.  Have an electrician put in a light switch at the top and bottom of the stairs. OTHER FALL PREVENTION TIPS  Wear low-heel or rubber-soled shoes that are supportive and fit well. Wear closed toe shoes.  When using a stepladder, make sure it is fully opened and both spreaders are firmly locked. Do not climb a closed stepladder.  Add color or contrast paint or tape to grab bars and handrails in your home. Place contrasting color strips on first and last steps.  Learn and use mobility aids as needed. Install an electrical emergency response system.  Turn on lights to avoid dark areas. Replace light bulbs that burn out immediately.  Get light switches that glow.  Arrange furniture to create clear pathways. Keep furniture in the same place.  Firmly attach carpet with non-skid or double-sided tape.  Eliminate uneven floor surfaces.  Select a carpet pattern that does not visually hide the edge of steps.  Be aware of all pets. OTHER HOME SAFETY TIPS  Set the water temperature for 120 F (48.8 C).  Keep emergency numbers on or near the telephone.  Keep smoke detectors on every level of the home and near sleeping areas. Document Released: 03/17/2002 Document Revised: 09/26/2011  Document Reviewed: 06/16/2011 ExitCare Patient Information 2015 [employer]. This information is not intended to replace advice given to you by your health care provider. Make sure you discuss any questions you have with your health care provider.   Preventive Care for Adults Ages 65 and over  Blood pressure check.** / Every 1 to 2 years.  Lipid and cholesterol check.**/ Every 5 years beginning at age 20.  Lung cancer screening. / Every year if you are aged 55-80 years and have a 30-pack-year history of smoking and currently smoke or have quit within the past 15 years. Yearly screening is stopped once you have quit smoking for at least 15 years or develop a health problem that would prevent you from having lung cancer treatment.  Fecal occult blood test (FOBT) of stool. / Every year beginning at age 50 and continuing until age 75. You may not have to do this test if you get a colonoscopy every 10 years.  Flexible sigmoidoscopy** or colonoscopy.** / Every 5 years for a flexible sigmoidoscopy or every 10 years for a colonoscopy beginning at age 50 and continuing until age 75.  Hepatitis C blood test.** / For all people born from 1945 through 1965 and any individual with known risks for hepatitis C.  Abdominal aortic aneurysm (AAA) screening.** / A one-time screening for ages 65 to 75 years who are current or former smokers.  Skin self-exam. / Monthly.  Influenza vaccine. / Every year.  Tetanus, diphtheria, and acellular pertussis (Tdap/Td) vaccine.** / 1 dose of Td every 10 years.  Varicella vaccine.** / Consult your health care provider.  Zoster vaccine.** / 1 dose for adults aged 60 years or older.  Pneumococcal 13-valent conjugate (PCV13) vaccine.** / Consult your health care provider.  Pneumococcal polysaccharide (PPSV23) vaccine.** / 1 dose for all adults aged 65 years and older.  Meningococcal vaccine.** / Consult your health care provider.  Hepatitis A vaccine.** /  Consult your health care provider.  Hepatitis B vaccine.** / Consult your health care provider.  Haemophilus influenzae type b (Hib) vaccine.** / Consult your health care provider. **Family history and personal history of risk and conditions may change your health care provider's recommendations. Document Released: 05/23/2001 Document Revised: 04/01/2013 Document Reviewed: 08/22/2010 ExitCare Patient Information 2015 [employer]. This information is not intended to replace advice given to you by your health care provider. Make sure you discuss any questions you have with your health care provider.   

## 2015-09-15 ENCOUNTER — Ambulatory Visit (INDEPENDENT_AMBULATORY_CARE_PROVIDER_SITE_OTHER): Payer: PPO | Admitting: Physical Therapy

## 2015-09-15 ENCOUNTER — Encounter: Payer: Self-pay | Admitting: Physical Therapy

## 2015-09-15 ENCOUNTER — Ambulatory Visit (HOSPITAL_BASED_OUTPATIENT_CLINIC_OR_DEPARTMENT_OTHER)
Admission: RE | Admit: 2015-09-15 | Discharge: 2015-09-15 | Disposition: A | Discharge status: Home / Self Care | Payer: PPO | Source: Ambulatory Visit | Attending: Acute Care | Admitting: Acute Care

## 2015-09-15 DIAGNOSIS — R29898 Other symptoms and signs involving the musculoskeletal system: Secondary | ICD-10-CM

## 2015-09-15 DIAGNOSIS — F17210 Nicotine dependence, cigarettes, uncomplicated: Secondary | ICD-10-CM | POA: Diagnosis not present

## 2015-09-15 DIAGNOSIS — M5441 Lumbago with sciatica, right side: Secondary | ICD-10-CM

## 2015-09-15 DIAGNOSIS — Z87891 Personal history of nicotine dependence: Secondary | ICD-10-CM | POA: Insufficient documentation

## 2015-09-15 DIAGNOSIS — M5442 Lumbago with sciatica, left side: Secondary | ICD-10-CM | POA: Diagnosis not present

## 2015-09-15 DIAGNOSIS — Z122 Encounter for screening for malignant neoplasm of respiratory organs: Secondary | ICD-10-CM | POA: Diagnosis not present

## 2015-09-15 DIAGNOSIS — I2510 Atherosclerotic heart disease of native coronary artery without angina pectoris: Secondary | ICD-10-CM | POA: Diagnosis not present

## 2015-09-15 DIAGNOSIS — M25552 Pain in left hip: Secondary | ICD-10-CM | POA: Diagnosis not present

## 2015-09-15 DIAGNOSIS — M6289 Other specified disorders of muscle: Secondary | ICD-10-CM | POA: Diagnosis not present

## 2015-09-15 NOTE — Therapy (Signed)
[place], [place], [address] Phone: [phone]   Fax:  [phone]  Physical Therapy Treatment  Patient Details  Name: [name] MRN: [id number] Date of Birth: 1945/05/08 Referring Provider: Leigh Aurora, MD  Encounter Date: 09/15/2015      PT End of Session - 09/15/15 1157    Visit Number 22   Number of Visits 27   Date for PT Re-Evaluation 10/14/15   PT Start Time 1102   PT Stop Time 1207   PT Time Calculation (min) 65 min   Activity Tolerance Patient tolerated treatment well      Past Medical History  Diagnosis Date  . Hypothyroidism   . Migraine   . Osteopenia   . Colonic polyp     bx adenomatous polyps, next scope 2010  . Pleuritic chest pain     etiology unclear, has beeb eval by pulmonary and rheumatology (autoimmune dz?)  . Depression   . Chest pain     stress test @ [facility] cardiology (-)  . Fibromyalgia     see rheumatology  . 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 Glaucoma     Past Surgical History  Procedure Laterality Date  . Tonsillectomy    . Right rotator cuff repair  2010  . 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  . Esophagogastroduodenoscopy (egd) with propofol N/A 03/27/2013    Procedure: ESOPHAGOGASTRODUODENOSCOPY (EGD) WITH PROPOFOL;  Surgeon: Milus Banister, MD;  Location: [facility];  Service: Endoscopy;  Laterality: N/A;  possible dil  . Plastic surgery face  2013    There were no vitals filed for this visit.      Subjective Assessment - 09/15/15 1105    Subjective Tyesha has seen her ortho MD and he feels like the problem is arthritis in the hip and he said nothing helps it. Her back is better now since the injection, now the Lt hip is her biggest problem   Currently in Pain? Yes   Pain Score 2   at rest 4/10with sleep, 7/10with walking.    Pain Location Hip   Pain Orientation Left;Lateral   Pain Descriptors / Indicators Aching   Pain  Type Acute pain   Pain Frequency Intermittent   Aggravating Factors  prolonged sitting and standing   Pain Relieving Factors lying on her back.                         [facility] Adult PT Treatment/Exercise - 09/15/15 0001    Exercises   Exercises Lumbar   Lumbar Exercises: Stretches   Quad Stretch 30 seconds;2 reps  prone with strap Lt   ITB Stretch 2 reps;30 seconds  cross body with strap   Modalities   Modalities Electrical Stimulation;Moist Heat   Moist Heat Therapy   Number Minutes Moist Heat 15 Minutes   Moist Heat Location Hip;Lumbar Spine  Lt side   Electrical Stimulation   Electrical Stimulation Location Lt lateral hip/lumbar   Electrical Stimulation Action IFC   Electrical Stimulation Parameters to tolerance   Electrical Stimulation Goals Pain;Tone   Manual Therapy   Manual Therapy Soft tissue mobilization   Soft tissue mobilization LT QL, quad, lateral Lt thigh          Trigger Point Dry Needling - 09/15/15 1153    Consent Given? Yes   Education Handout Provided Yes   Muscles Treated Upper Body  Quadratus Lumborum   Muscles Treated Lower Body Quadriceps  glut medius   Quadriceps Response Palpable increased muscle length;Twitch response elicited  good response in glut med and QL                PT Short Term Goals - 06/01/15 0840    PT SHORT TERM GOAL #1   Title pt independent with initial HEP by 05/27/15   Status Achieved           PT Long Term Goals - 09/02/15 1500    PT LONG TERM GOAL #1   Title pt independent with advanced HEP as necessary by 10/14/15   Status On-going   PT LONG TERM GOAL #2   Title pt displays B LE MMT 4+/5 or better without c/o LBP or hip pain by 10/14/15   Status On-going   PT LONG TERM GOAL #3   Title pt reports LBP improves to no greater than 4/10 at worst and 2/10 on AVG to allow performance of ADLs and chores without restriction by pain by 10/14/15   Status On-going               Plan - 09/15/15  1154    Clinical Impression Statement This is this therapists first time treating this patient.  She has some leftover adhesions in her Lt glut med from her injury this past winter and tightness into the quad.  She also has tightness in her Lt ITB.  She had good twitch responses with TDN.  Two new stretches were added and she will need strenthening to suppport her hip and Lt side to allow her to return to dancing without having a reinjury.    Rehab Potential Good   PT Frequency 1x / week   PT Duration 6 weeks   PT Treatment/Interventions Therapeutic exercise;Therapeutic activities;Manual techniques;Moist Heat;Traction;Taping;Patient/family education;Neuromuscular re-education;Functional mobility training;Electrical Stimulation;Cryotherapy;Dry needling   PT Next Visit Plan Dry Needling L glutes/hip.  Hip and Lumbopelvic stability training    Consulted and Agree with Plan of Care Patient      Patient will benefit from skilled therapeutic intervention in order to improve the following deficits and impairments:  Pain, Postural dysfunction, Decreased strength, Decreased mobility, Impaired flexibility, Improper body mechanics  Visit Diagnosis: Pain in left hip  Bilateral low back pain with sciatica, sciatica laterality unspecified  Weakness of both hips     Problem List Patient Active Problem List   Diagnosis Date Noted  . PCP NOTES >>>>>>>>>>>>>>>>>>>> 05/24/2015  . Depression with anxiety 05/22/2015  . Numbness and tingling of right arm 05/22/2015  . Pain in the chest   . Chest pain 05/21/2015  . Insomnia 09/01/2014  . GERD (gastroesophageal reflux disease) 03/27/2013  . Dysphagia, pharyngoesophageal phase 03/27/2013  . Annual physical exam 06/12/2011  . Sinusitis, chronic 02/02/2011  . Osteoporosis 06/08/2010  . Fibromyalgia, pain mngmt 01/29/2009  . LIPOMA 09/30/2008  . DEPRESSION 10/29/2007  . Migraine without aura 03/04/2007  . Hypothyroidism 05/15/2006  . COLONIC POLYPS, HX  OF 05/15/2006    [name] PT  09/15/2015, 11:58 AM  [facility] [place], [place], [address] Phone: [phone]   Fax:  [phone]  Name: [name] MRN: [id number] Date of Birth: [birth year]

## 2015-09-15 NOTE — Patient Instructions (Addendum)
Trigger Point Dry Needling  . What is Trigger Point Dry Needling (DN)? o DN is a physical therapy technique used to treat muscle pain and dysfunction. Specifically, DN helps deactivate muscle trigger points (muscle knots).  o A thin filiform needle is used to penetrate the skin and stimulate the underlying trigger point. The goal is for a local twitch response (LTR) to occur and for the trigger point to relax. No medication of any kind is injected during the procedure.   . What Does Trigger Point Dry Needling Feel Like?  o The procedure feels different for each individual patient. Some patients report that they do not actually feel the needle enter the skin and overall the process is not painful. Very mild bleeding may occur. However, many patients feel a deep cramping in the muscle in which the needle was inserted. This is the local twitch response.   Marland Kitchen How Will I feel after the treatment? o Soreness is normal, and the onset of soreness may not occur for a few hours. Typically this soreness does not last longer than two days.  o Bruising is uncommon, however; ice can be used to decrease any possible bruising.  o In rare cases feeling tired or nauseous after the treatment is normal. In addition, your symptoms may get worse before they get better, this period will typically not last longer than 24 hours.   . What Can I do After My Treatment? o Increase your hydration by drinking more water for the next 24 hours. o You may place ice or heat on the areas treated that have become sore, however, do not use heat on inflamed or bruised areas. Heat often brings more relief post needling. o You can continue your regular activities, but vigorous activity is not recommended initially after the treatment for 24 hours. o DN is best combined with other physical therapy such as strengthening, stretching, and other therapies.                   Quads / HF, Prone    Lie face down, knees  together. Grasp one ankle with same-side hand. Use towel if needed to reach. Gently pull foot toward buttock. Hold _30-45__ seconds. Repeat __2_ times per session. Do _1__ sessions per day.  Outer Hip Stretch: Reclined IT Band Stretch (Strap)    Strap around opposite foot, pull across only as far as possible with shoulders on mat. Hold for _30-45___ secs. Repeat _2___ times each leg.  Copyright  VHI. All rights reserved.

## 2015-09-23 ENCOUNTER — Encounter: Payer: Self-pay | Admitting: Physical Therapy

## 2015-09-23 ENCOUNTER — Other Ambulatory Visit: Payer: Self-pay | Admitting: Acute Care

## 2015-09-23 DIAGNOSIS — Z87891 Personal history of nicotine dependence: Secondary | ICD-10-CM

## 2015-09-29 ENCOUNTER — Encounter: Payer: PPO | Admitting: Physical Therapy

## 2015-10-06 NOTE — Telephone Encounter (Signed)
Lmtcb x1

## 2015-10-07 ENCOUNTER — Telehealth: Payer: Self-pay | Admitting: Acute Care

## 2015-10-07 ENCOUNTER — Telehealth: Payer: Self-pay

## 2015-10-07 DIAGNOSIS — Z87891 Personal history of nicotine dependence: Secondary | ICD-10-CM

## 2015-10-07 NOTE — Telephone Encounter (Signed)
UDS: 09/02/2015  Negative for Alprazolam: PRN Positive for Hydrocodone   Low risk per Dr. Larose Kells 10/07/2015

## 2015-10-14 NOTE — Telephone Encounter (Signed)
Called spoke with pt. She states that she was returning a call. She states that she had her lung cancer screening on 09/11/15 and did not receive a call back for results. I explained to her that I would send a message to [place] for her results and recs. She voiced understanding and had no further questions.   Sarah please advise

## 2015-10-14 NOTE — Telephone Encounter (Signed)
I have called and spoken with [name] regarding the results of her low-dose CT scan. I explained to her that her scan was read as Lung RADS 2: nodules that are benign in appearance and behavior with a very low likelihood of becoming a clinically active cancer due to size or lack of growth. Recommendation per radiology is for a repeat LDCT in 12 months. I told her we would schedule her annual follow-up scan for 12 months per recommendations. She verbalized understanding and had no further questions. I told her that I would fax a copy of the results to her primary care physician Dr. Larose Kells.

## 2015-10-15 ENCOUNTER — Ambulatory Visit (INDEPENDENT_AMBULATORY_CARE_PROVIDER_SITE_OTHER): Payer: PPO | Admitting: Physical Therapy

## 2015-10-15 ENCOUNTER — Encounter: Payer: Self-pay | Admitting: Physical Therapy

## 2015-10-15 DIAGNOSIS — M25552 Pain in left hip: Secondary | ICD-10-CM

## 2015-10-15 DIAGNOSIS — M5441 Lumbago with sciatica, right side: Secondary | ICD-10-CM

## 2015-10-15 DIAGNOSIS — M6289 Other specified disorders of muscle: Secondary | ICD-10-CM

## 2015-10-15 DIAGNOSIS — M545 Low back pain: Secondary | ICD-10-CM | POA: Diagnosis not present

## 2015-10-15 DIAGNOSIS — R29898 Other symptoms and signs involving the musculoskeletal system: Secondary | ICD-10-CM

## 2015-10-15 DIAGNOSIS — M5442 Lumbago with sciatica, left side: Secondary | ICD-10-CM

## 2015-10-15 NOTE — Therapy (Signed)
[place], [place], [address] Phone: [phone]   Fax:  [phone]  Physical Therapy Treatment  Patient Details  Name: [name] MRN: [id number] Date of Birth: [birth year] Referring Provider: Leigh Aurora, MD  Encounter Date: 10/15/2015      PT End of Session - 10/15/15 1146    Visit Number 23   Number of Visits 31   Date for PT Re-Evaluation 11/12/15   PT Start Time 1146   PT Stop Time 1237   PT Time Calculation (min) 51 min   Activity Tolerance Patient tolerated treatment well      Past Medical History  Diagnosis Date  . Hypothyroidism   . Migraine   . Osteopenia   . Colonic polyp     bx adenomatous polyps, next scope 2010  . Pleuritic chest pain     etiology unclear, has beeb eval by pulmonary and rheumatology (autoimmune dz?)  . Depression   . Chest pain     stress test @ [facility] cardiology (-)  . Fibromyalgia     see rheumatology  . 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 Glaucoma     Past Surgical History  Procedure Laterality Date  . Tonsillectomy    . Right rotator cuff repair  2010  . 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  . Esophagogastroduodenoscopy (egd) with propofol N/A 03/27/2013    Procedure: ESOPHAGOGASTRODUODENOSCOPY (EGD) WITH PROPOFOL;  Surgeon: Milus Banister, MD;  [facility]: [facility];  Service: Endoscopy;  Laterality: N/A;  possible dil  . Plastic surgery face  2013    There were no vitals filed for this visit.      Subjective Assessment - 10/15/15 1146    Subjective Pt reports she had a terrible cold and that is why she hasnt been in for therapy.  She reports she was good until she drove to [place] on 7/4 w/e and she washed the dogs two days ago.  She did have relief after the needling.    Patient Stated Goals strengthen muscles to lessen back pain   Currently in Pain? Yes   Pain Score 3   up to 7/10 with weight bearing   Pain Location Hip   Pain Orientation Left;Lateral   Pain Descriptors / Indicators Aching   Pain Type Chronic pain   Aggravating Factors  walking/weightbearing   Pain Relieving Factors lying flat on her back                         [facility] Adult PT Treatment/Exercise - 10/15/15 0001    Lumbar Exercises: Stretches   ITB Stretch 3 reps;30 seconds  cross body with strap   Modalities   Modalities Electrical Stimulation;Moist Heat;Iontophoresis   Moist Heat Therapy   Number Minutes Moist Heat 15 Minutes   Moist Heat Location --  lateral Lt thigh   Electrical Stimulation   Electrical Stimulation Location Lt lateral thigh   Electrical Stimulation Action IFC    Electrical Stimulation Parameters to tolerance   Electrical Stimulation Goals Pain;Tone   Iontophoresis   Type of Iontophoresis Dexamethasone   Location posterior to Lt greater troch   Dose 1.0cc   Time 8 hr patch 157mA   Manual Therapy   Manual Therapy Soft tissue mobilization   Manual therapy comments Pt very point tender around the Lt greater trochanter/busra area   Soft tissue mobilization Lt hamstring, ITB and  TFL, use of IASTM also.           Trigger Point Dry Needling - 10/15/15 1150    Consent Given? Yes   Education Handout Provided No   Muscles Treated Lower Body Hamstring  lateral Lt, good twitch response              PT Education - 10/15/15 1233    Education provided Yes   Education Details return to HEP as she is feeling better now   [employer]) Educated Patient   Methods Explanation   Comprehension Verbalized understanding          PT Short Term Goals - 06/01/15 0840    PT SHORT TERM GOAL #1   Title pt independent with initial HEP by 05/27/15   Status Achieved           PT Long Term Goals - 10/15/15 1244    PT LONG TERM GOAL #1   Title pt independent with advanced HEP as necessary by 11/12/15   Time 4   Period Weeks   Status On-going   PT LONG TERM GOAL #2   Title pt displays B LE MMT  4+/5 or better without c/o LBP or hip pain by 11/12/15   Time 4   Period Weeks   Status On-going   PT LONG TERM GOAL #3   Title pt reports LBP improves to no greater than 4/10 at worst and 2/10 on AVG to allow performance of ADLs and chores without restriction by pain by 10/14/15   Status Achieved   PT LONG TERM GOAL #4   Title Report Lt lateral hip pain no greater than 2/10 with daily activities ( 11/12/15)   Time 4   Period Weeks   Status New               Plan - 10/15/15 1234    Clinical Impression Statement [name] has been sick over the last month and then out of town due to a death in the family.  She reports that the pain in her Lt side low back and quad are greatly reduced and her pain is now located posterior Lt hip and thigh. She does have tightness in her lateral hamstring and ITB.  PResents like hip bursitis. She would benefit from continued treatment to address the last of her concerns   Rehab Potential Good   PT Frequency 2x / week   PT Duration 4 weeks   PT Treatment/Interventions Therapeutic exercise;Therapeutic activities;Manual techniques;Moist Heat;Traction;Taping;Patient/family education;Neuromuscular re-education;Functional mobility training;Electrical Stimulation;Cryotherapy;Dry needling;Iontophoresis 4mg /ml Dexamethasone   PT Next Visit Plan assess response to ionto, TDN, add in core/hip stability ex.    Consulted and Agree with Plan of Care Patient      Patient will benefit from skilled therapeutic intervention in order to improve the following deficits and impairments:  Pain, Postural dysfunction, Decreased strength, Decreased mobility, Impaired flexibility, Improper body mechanics  Visit Diagnosis: Bilateral low back pain with sciatica, sciatica laterality unspecified - Plan: PT plan of care cert/re-cert  Pain in left hip - Plan: PT plan of care cert/re-cert  Weakness of both hips - Plan: PT plan of care cert/re-cert  Bilateral low back pain, with  sciatica presence unspecified - Plan: PT plan of care cert/re-cert     Problem List Patient Active Problem List   Diagnosis Date Noted  . PCP NOTES >>>>>>>>>>>>>>>>>>>> 05/24/2015  . Depression with anxiety 05/22/2015  . Numbness and tingling of right arm 05/22/2015  . Pain in the chest   .  Chest pain 05/21/2015  . Insomnia 09/01/2014  . GERD (gastroesophageal reflux disease) 03/27/2013  . Dysphagia, pharyngoesophageal phase 03/27/2013  . Annual physical exam 06/12/2011  . Sinusitis, chronic 02/02/2011  . Osteoporosis 06/08/2010  . Fibromyalgia, pain mngmt 01/29/2009  . LIPOMA 09/30/2008  . DEPRESSION 10/29/2007  . Migraine without aura 03/04/2007  . Hypothyroidism 05/15/2006  . COLONIC POLYPS, HX OF 05/15/2006    [name] PT  10/15/2015, 12:49 PM  [facility] [place] [address], [place], [address] Phone: [phone]   Fax:  [phone]  Name: [name] MRN: [id number] Date of Birth: 02/04/1946

## 2015-10-18 ENCOUNTER — Other Ambulatory Visit: Payer: Self-pay | Admitting: Internal Medicine

## 2015-10-19 ENCOUNTER — Ambulatory Visit (INDEPENDENT_AMBULATORY_CARE_PROVIDER_SITE_OTHER): Payer: PPO | Admitting: Physical Therapy

## 2015-10-19 ENCOUNTER — Encounter: Payer: Self-pay | Admitting: Internal Medicine

## 2015-10-19 ENCOUNTER — Encounter: Payer: Self-pay | Admitting: Physical Therapy

## 2015-10-19 DIAGNOSIS — M6289 Other specified disorders of muscle: Secondary | ICD-10-CM | POA: Diagnosis not present

## 2015-10-19 DIAGNOSIS — M5441 Lumbago with sciatica, right side: Secondary | ICD-10-CM | POA: Diagnosis not present

## 2015-10-19 DIAGNOSIS — M5442 Lumbago with sciatica, left side: Secondary | ICD-10-CM | POA: Diagnosis not present

## 2015-10-19 DIAGNOSIS — M25552 Pain in left hip: Secondary | ICD-10-CM

## 2015-10-19 DIAGNOSIS — R29898 Other symptoms and signs involving the musculoskeletal system: Secondary | ICD-10-CM

## 2015-10-19 NOTE — Therapy (Signed)
[place], [place], [address] Phone: [phone]   Fax:  [phone]  Physical Therapy Treatment  Patient Details  Name: [name] MRN: [id number] Date of Birth: 16-Dec-1945 Referring Provider: Leigh Aurora, MD  Encounter Date: 10/19/2015      PT End of Session - 10/19/15 0847    Visit Number 24   Number of Visits 31   Date for PT Re-Evaluation 11/12/15   PT Start Time [id number]   PT Stop Time 0929   PT Time Calculation (min) 42 min   Activity Tolerance Patient limited by pain      Past Medical History  Diagnosis Date  . Hypothyroidism   . Migraine   . Osteopenia   . Colonic polyp     bx adenomatous polyps, next scope 2010  . Pleuritic chest pain     etiology unclear, has beeb eval by pulmonary and rheumatology (autoimmune dz?)  . Depression   . Chest pain     stress test @ [facility] cardiology (-)  . Fibromyalgia     see rheumatology  . 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 Glaucoma     Past Surgical History  Procedure Laterality Date  . Tonsillectomy    . Right rotator cuff repair  2010  . 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  . Esophagogastroduodenoscopy (egd) with propofol N/A 03/27/2013    Procedure: ESOPHAGOGASTRODUODENOSCOPY (EGD) WITH PROPOFOL;  Surgeon: Milus Banister, MD;  Location: [facility];  Service: Endoscopy;  Laterality: N/A;  possible dil  . Plastic surgery face  2013    There were no vitals filed for this visit.      Subjective Assessment - 10/19/15 0848    Subjective Pt reports that her pain has been worse since her last visit.  She went to her normal stores yesterday and is in agony now.    Currently in Pain? Yes   Pain Score 3   8/10 yesterday   Pain Location Back   Pain Orientation Left;Lateral   Pain Descriptors / Indicators Aching;Burning   Pain Type Chronic pain                         [facility] Adult PT Treatment/Exercise -  10/19/15 0001    Exercises   Exercises Lumbar   Lumbar Exercises: Stretches   Double Knee to Chest Stretch 20 seconds   Lower Trunk Rotation --  10 reps, 2-3 sec each side   Lumbar Exercises: Supine   Ab Set 10 reps;5 seconds   Isometric Hip Flexion 10 reps;5 seconds  each side   Lumbar Exercises: Prone   Other Prone Lumbar Exercises POE, then 5 press ups   Other Prone Lumbar Exercises pelvic press, then with knee flex/ext   Modalities   Modalities Traction   Traction   Type of Traction Lumbar  ( 170 # ) in prone   Min (lbs) 30   Max (lbs) 45   Hold Time 60   Rest Time 20   Time 12                PT Education - 10/19/15 0859    Education provided Yes   Education Details HEP   Person(s) Educated Patient   Methods Explanation;Handout   Comprehension Returned demonstration          PT Short Term Goals - 06/01/15 0840  PT SHORT TERM GOAL #1   Title pt independent with initial HEP by 05/27/15   Status Achieved           PT Long Term Goals - 10/19/15 0905    PT LONG TERM GOAL #1   Title pt independent with advanced HEP as necessary by 11/12/15   Status On-going   PT LONG TERM GOAL #2   Title pt displays B LE MMT 4+/5 or better without c/o LBP or hip pain by 11/12/15   Status On-going   PT LONG TERM GOAL #3   Title pt reports LBP improves to no greater than 4/10 at worst and 2/10 on AVG to allow performance of ADLs and chores without restriction by pain by 10/14/15   Status Achieved   PT LONG TERM GOAL #4   Title Report Lt lateral hip pain no greater than 2/10 with daily activities ( 11/12/15)   Status On-going               Plan - 10/19/15 0918    Clinical Impression Statement Patient has had a flare up of pain over the weekend.  The pain is going back down her Lt LE. She was able to centralize her pain with exercise.  Will add lumbar traction back in to see if we can centralize her pain.  Also add in beginner core stabilization. She did not get any  relief with the ionto patch.    Rehab Potential Good   PT Frequency 2x / week   PT Duration 4 weeks   PT Treatment/Interventions Therapeutic exercise;Therapeutic activities;Manual techniques;Moist Heat;Traction;Taping;Patient/family education;Neuromuscular re-education;Functional mobility training;Electrical Stimulation;Cryotherapy;Dry needling;Iontophoresis 4mg /ml Dexamethasone   PT Next Visit Plan assess response to traction and new ther ex   Consulted and Agree with Plan of Care Patient      Patient will benefit from skilled therapeutic intervention in order to improve the following deficits and impairments:  Pain, Postural dysfunction, Decreased strength, Decreased mobility, Impaired flexibility, Improper body mechanics  Visit Diagnosis: Bilateral low back pain with sciatica, sciatica laterality unspecified  Pain in left hip  Weakness of both hips     Problem List Patient Active Problem List   Diagnosis Date Noted  . PCP NOTES >>>>>>>>>>>>>>>>>>>> 05/24/2015  . Depression with anxiety 05/22/2015  . Numbness and tingling of right arm 05/22/2015  . Pain in the chest   . Chest pain 05/21/2015  . Insomnia 09/01/2014  . GERD (gastroesophageal reflux disease) 03/27/2013  . Dysphagia, pharyngoesophageal phase 03/27/2013  . Annual physical exam 06/12/2011  . Sinusitis, chronic 02/02/2011  . Osteoporosis 06/08/2010  . Fibromyalgia, pain mngmt 01/29/2009  . LIPOMA 09/30/2008  . DEPRESSION 10/29/2007  . Migraine without aura 03/04/2007  . Hypothyroidism 05/15/2006  . COLONIC POLYPS, HX OF 05/15/2006    [name] PT  10/19/2015, 9:25 AM  [facility] [place], [place], [address] Phone: [phone]   Fax:  [phone]  Name: [name] MRN: [id number] Date of Birth: 1945/12/29

## 2015-10-19 NOTE — Patient Instructions (Addendum)
On Elbows (Prone)    Rise up on elbows as high as possible, keeping hips on floor. Hold _60-120___ seconds. Repeat __1__ times per set. Do _1___ sets per session. Do _as needed for pain down the leg, at least once a day. ___ sessions per day.    Press-Up    Press upper body upward, keeping hips in contact with floor. Keep lower back and buttocks relaxed. Hold _1-2___ seconds. Repeat __5__ times per set. Do __1__ sets per session. Do __as needed for pain into the leg, at least once a day__ sessions per day.  Pelvic Press    Place hands under belly between navel and pubic bone, palms up. Feel pressure on hands. Increase pressure on hands by pressing pelvis down. This is NOT a pelvic tilt. Hold _5__ seconds. Relax. Repeat _10__ times. Once a day.  KNEE: Flexion - Prone - can have hands under hips to feel for equal pressure through each hand.     Perform pelvic press, Bend knee. Raise heel toward buttocks. Do not raise hips. _10__ reps per set, __1_ sets per day, __7_ days per week.  Repeat on the other leg, then perform with both legs at the same time.   Abdominal Bracing With Pelvic Floor (Hook-Lying)    With neutral spine, tighten pelvic floor and abdominals. Hold 5 sec. Repeat _10__ times. Do __1_ times a day.  Copyright  VHI. All rights reserved.

## 2015-10-21 ENCOUNTER — Encounter: Payer: Self-pay | Admitting: Physical Therapy

## 2015-10-21 ENCOUNTER — Ambulatory Visit (INDEPENDENT_AMBULATORY_CARE_PROVIDER_SITE_OTHER): Payer: PPO | Admitting: Physical Therapy

## 2015-10-21 DIAGNOSIS — M6289 Other specified disorders of muscle: Secondary | ICD-10-CM

## 2015-10-21 DIAGNOSIS — M5442 Lumbago with sciatica, left side: Secondary | ICD-10-CM

## 2015-10-21 DIAGNOSIS — M5441 Lumbago with sciatica, right side: Secondary | ICD-10-CM

## 2015-10-21 DIAGNOSIS — M25552 Pain in left hip: Secondary | ICD-10-CM

## 2015-10-21 NOTE — Therapy (Signed)
[place], [place], [address] Phone: [phone]   Fax:  [phone]  Physical Therapy Treatment  Patient Details  Name: [name] MRN: [id number] Date of Birth: 1945/06/13 Referring Provider: Leigh Aurora, MD  Encounter Date: 10/21/2015    Past Medical History  Diagnosis Date  . Hypothyroidism   . Migraine   . Osteopenia   . Colonic polyp     bx adenomatous polyps, next scope 2010  . Pleuritic chest pain     etiology unclear, has beeb eval by pulmonary and rheumatology (autoimmune dz?)  . Depression   . Chest pain     stress test @ [facility] cardiology (-)  . Fibromyalgia     see rheumatology  . 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 Glaucoma     Past Surgical History  Procedure Laterality Date  . Tonsillectomy    . Right rotator cuff repair  2010  . 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  . Esophagogastroduodenoscopy (egd) with propofol N/A 03/27/2013    Procedure: ESOPHAGOGASTRODUODENOSCOPY (EGD) WITH PROPOFOL;  Surgeon: Milus Banister, MD;  Location: [facility];  Service: Endoscopy;  Laterality: N/A;  possible dil  . Plastic surgery face  2013    There were no vitals filed for this visit.      Subjective Assessment - 10/21/15 1541    Subjective Pt states that her pain continues to get worse and she is concerned about what is going on in her back.  She also doesn't want to use up all her PT visits incase she has a need for more later.                                     PT Short Term Goals - 06/01/15 0840    PT SHORT TERM GOAL #1   Title pt independent with initial HEP by 05/27/15   Status Achieved           PT Long Term Goals - 10/19/15 0905    PT LONG TERM GOAL #1   Title pt independent with advanced HEP as necessary by 11/12/15   Status On-going   PT LONG TERM GOAL #2   Title pt displays B LE MMT 4+/5 or better without c/o LBP or  hip pain by 11/12/15   Status On-going   PT LONG TERM GOAL #3   Title pt reports LBP improves to no greater than 4/10 at worst and 2/10 on AVG to allow performance of ADLs and chores without restriction by pain by 10/14/15   Status Achieved   PT LONG TERM GOAL #4   Title Report Lt lateral hip pain no greater than 2/10 with daily activities ( 11/12/15)   Status On-going               Plan - 10/21/15 1543    Clinical Impression Statement Discussed placing patient on hold for 2 weeks to allow her to follow up with MD and see a surgeon for treatment options.    PT Next Visit Plan place on hold    Consulted and Agree with Plan of Care Patient      Patient will benefit from skilled therapeutic intervention in order to improve the following deficits and impairments:     Visit Diagnosis: Pain in left hip  Bilateral low back pain with sciatica,  sciatica laterality unspecified  Weakness of both hips     Problem List Patient Active Problem List   Diagnosis Date Noted  . PCP NOTES >>>>>>>>>>>>>>>>>>>> 05/24/2015  . Depression with anxiety 05/22/2015  . Numbness and tingling of right arm 05/22/2015  . Pain in the chest   . Chest pain 05/21/2015  . Insomnia 09/01/2014  . GERD (gastroesophageal reflux disease) 03/27/2013  . Dysphagia, pharyngoesophageal phase 03/27/2013  . Annual physical exam 06/12/2011  . Sinusitis, chronic 02/02/2011  . Osteoporosis 06/08/2010  . Fibromyalgia, pain mngmt 01/29/2009  . LIPOMA 09/30/2008  . DEPRESSION 10/29/2007  . Migraine without aura 03/04/2007  . Hypothyroidism 05/15/2006  . COLONIC POLYPS, HX OF 05/15/2006    [name] PT 10/21/2015, 3:53 PM  [facility] [place], [place], [address] Phone: [phone]   Fax:  [phone]  Name: RONDELL ZERVAS MRN: [id number] Date of Birth: 11-23-1945

## 2015-10-27 ENCOUNTER — Encounter: Payer: PPO | Admitting: Physical Therapy

## 2015-10-28 ENCOUNTER — Encounter: Payer: PPO | Admitting: Physical Therapy

## 2015-10-29 NOTE — Telephone Encounter (Signed)
Referral Notes     Type Date User   General 09/23/2015 10:50 AM Marlow Berenguer P        Note   Called and spoke with pt. She states that she just had LDCT and that she needs to be scheduled for one in 11 months. i explained to her that I would go ahead and place the order for the CT and that we would contact her closer to her date to schedule her 1 year follow up. She voiced understanding and had no further questions. Order placed.      Nothing further needed.

## 2015-11-28 ENCOUNTER — Telehealth: Payer: Self-pay | Admitting: Internal Medicine

## 2015-11-29 MED ORDER — HYDROCODONE-ACETAMINOPHEN 5-325 MG PO TABS: 1.0000 | ORAL_TABLET | Freq: Four times a day (QID) | ORAL | 0 refills | Status: DC | PRN

## 2015-11-29 NOTE — Telephone Encounter (Signed)
Pt informed via MyChart that Rx has been placed at front desk for pick up at her convenience.  

## 2015-11-29 NOTE — Telephone Encounter (Signed)
Pt is requesting refill on Hydrocodone.  Last OV: 09/08/2015 Last Fill: 09/01/2015 #300 and 0RF UDS: 09/02/2015 Low risk  Please advise.

## 2015-11-29 NOTE — Telephone Encounter (Signed)
Rx printed, awaiting MD signature.  

## 2015-11-29 NOTE — Telephone Encounter (Signed)
Okay #300, no refills. Will discuss decrease the dose of narcotics at the next visit

## 2015-12-01 ENCOUNTER — Ambulatory Visit (INDEPENDENT_AMBULATORY_CARE_PROVIDER_SITE_OTHER): Payer: PPO | Admitting: *Deleted

## 2015-12-01 ENCOUNTER — Encounter: Payer: Self-pay | Admitting: *Deleted

## 2015-12-01 VITALS — BP 132/76 | HR 71 | Resp 16 | Ht 66.0 in | Wt 178.6 lb

## 2015-12-01 DIAGNOSIS — Z0000 Encounter for general adult medical examination without abnormal findings: Secondary | ICD-10-CM

## 2015-12-01 NOTE — Progress Notes (Signed)
Pre visit review using our clinic review tool, if applicable. No additional management support is needed unless otherwise documented below in the visit note. 

## 2015-12-01 NOTE — Progress Notes (Addendum)
Subjective:   [name] is a 70 y.o. female who presents for Medicare Annual (Subsequent) preventive examination.  Review of Systems:  No ROS.  Medicare Wellness Visit.  Cardiac Risk Factors include: advanced age (>43men, >58 women);sedentary lifestyle  Sleep patterns: Interrupted sleep due to hip pain. Gets up 4x nightly to void, states this is normal for her due to her diet Pepsi intake, which she is not willing to give up or reduce.   Home Safety/Smoke Alarms: Lives at home w/ husband. Feels safe in home.   Living environment; residence and Firearm Safety: Stored in a safe place.  Seat Belt Safety/Bike Helmet: Wears seat belt.    Counseling:   Eye Exam- Follows Dr. Laban Emperor annually. Wears glasses. No issues reported. Dental- Dr. Bobby Rumpf every 6 months.  Female:   Pap- N/A, sees Dr. Ihor Dow (OB/GYN)      Mammo- 01/05/15 BI-RADS CATEGORY  1: Negative.      Dexa scan- 02/10/15 osteopenia        CCS- 04/26/12 3 sessile polyps removed, otherwise normal; 5 year recall       Objective:     Vitals: BP 132/76 (BP Location: Right Arm, Patient Position: Sitting, Cuff Size: Normal)   Pulse 71   Resp 16   Ht 5\' 6"  (1.676 m)   Wt 178 lb 9.6 oz (81 kg)   SpO2 99%   BMI 28.83 kg/m   Body mass index is 28.83 kg/m.   Tobacco History  Smoking Status  . Former Smoker  . Packs/day: 1.00  . Years: 38.50  . Types: Cigarettes  . Quit date: 01/13/2004  Smokeless Tobacco  . Never Used    Comment: quit 2005     Counseling given: Not Answered   Past Medical History:  Diagnosis Date  . Chest pain    stress test @ [facility] cardiology (-)  . Colonic polyp    bx adenomatous polyps, next scope 2010  . Depression   . Fibromyalgia    see rheumatology  . Glaucoma   . Hypothyroidism   . Migraine   . 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 Osteopenia   . Pleuritic chest pain    etiology unclear, has beeb eval by pulmonary and rheumatology (autoimmune dz?)   Past Surgical  History:  Procedure Laterality Date  . ESOPHAGOGASTRODUODENOSCOPY (EGD) WITH PROPOFOL N/A 03/27/2013   Procedure: ESOPHAGOGASTRODUODENOSCOPY (EGD) WITH PROPOFOL;  Surgeon: Milus Banister, MD;  Location: [facility];  Service: Endoscopy;  Laterality: N/A;  possible dil  . 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  . plastic surgery face  2013  . right rotator cuff repair  2010  . TONSILLECTOMY     Family History  Problem Relation Age of Onset  . Adopted: Yes  . Colon cancer Neg Hx   . Cancer Neg Hx   . Depression Neg Hx   . Diabetes Neg Hx   . Stroke Neg Hx   . Hypertension Neg Hx   . Heart disease Neg Hx    History  Sexual Activity  . Sexual activity: Not Currently    Outpatient Encounter Prescriptions as of 12/01/2015  Medication Sig  . ALPRAZolam (XANAX) 0.5 MG tablet Take 1 tablet (0.5 mg total) by mouth at bedtime as needed for anxiety.  Marland Kitchen aspirin 81 MG chewable tablet Chew 1 tablet (81 mg total) by mouth daily.  . calcium carbonate (OS-CAL) 1250 (500 CA) MG chewable tablet Chew 1 tablet by mouth  daily.  . Cholecalciferol (VITAMIN D3) 1000 UNITS CAPS Take 1,200 Units by mouth.  . conjugated estrogens (PREMARIN) vaginal cream Place 1 Applicatorful vaginally 2 (two) times a week.  . diphenhydramine-acetaminophen (TYLENOL PM) 25-500 MG TABS tablet Take 1 tablet by mouth at bedtime as needed.  Marland Kitchen HYDROcodone-acetaminophen (NORCO/VICODIN) 5-325 MG tablet Take 1 tablet by mouth every 6 (six) hours as needed for severe pain.  Marland Kitchen levothyroxine (SYNTHROID, LEVOTHROID) 88 MCG tablet Take 1 tablet (88 mcg total) by mouth daily before breakfast.  . Multiple Vitamin (MULTIVITAMIN) capsule Take 1 capsule by mouth daily.    . polycarbophil (FIBERCON) 625 MG tablet Take 625 mg by mouth daily.    . [DISCONTINUED] RESTASIS 0.05 % ophthalmic emulsion Place 1 drop into both eyes 2 (two) times daily.    No facility-administered encounter medications on file as of 12/01/2015.     Activities of  Daily Living In your present state of health, do you have any difficulty performing the following activities: 12/01/2015 09/08/2015  Hearing? Y N  Vision? N N  Difficulty concentrating or making decisions? N N  Walking or climbing stairs? N N  Dressing or bathing? N N  Doing errands, shopping? N N  Preparing Food and eating ? N -  Using the Toilet? N -  In the past six months, have you accidently leaked urine? N -  Do you have problems with loss of bowel control? N -  Managing your Medications? N -  Managing your Finances? N -  Housekeeping or managing your Housekeeping? N -  Some recent data might be hidden    Patient Care Team: Colon Branch, MD as PCP - General Garald Balding, MD as Consulting Physician (Orthopedic Surgery) Willow Ora (Optometry) Lavonia Drafts, MD as Consulting Physician (Obstetrics and Gynecology) Hennie Duos, MD as Consulting Physician (Rheumatology)    Assessment:    Physical assessment deferred to PCP.  Exercise Activities and Dietary recommendations Current Exercise Habits: The patient does not participate in regular exercise at present, Exercise limited by: orthopedic condition(s) (Hip pain)  Diet (meal preparation, eat out, water intake, caffeinated beverages, dairy products, fruits and vegetables): Hamburgers, steaks, chicken. Eats a salad every night with dinner. Eats 3 meals daily. Drinks diet Pepsi and 1 bottle of water daily. Tracks calories via food diary, but has not been tracking calories lately. Encouraged pt to restart calorie counting and provided "Nutrition in the [employer]" brochure w/ nutrition facts for popular restaurants for pt.    Goals    . Patient Stated (pt-stated)          Lose 25 lbs in the next year.      Fall Risk Fall Risk  12/01/2015 09/08/2015 11/27/2014 09/01/2014 08/08/2013  Falls in the past year? No No No No No   Depression Screen PHQ 2/9 Scores 12/01/2015 09/08/2015 11/27/2014 09/01/2014  PHQ - 2 Score 0 0 0 0      Cognitive Testing MMSE - Mini Mental State Exam 12/01/2015  Orientation to time 5  Orientation to Place 5  Registration 3  Attention/ Calculation 5  Recall 2  Language- name 2 objects 2  Language- repeat 1  Language- follow 3 step command 3  Language- read & follow direction 1  Write a sentence 1  Copy design 1  Total score 29    Immunization History  Administered Date(s) Administered  . H1N1 04/09/2008  . Influenza Split 02/28/2012  . Influenza Whole 03/04/2007, 01/29/2009, 02/07/2010  . Influenza, High  Dose Seasonal PF 01/07/2015  . Influenza,inj,Quad PF,36+ Mos 01/07/2013, 02/10/2014  . Pneumococcal Conjugate-13 08/08/2013  . Pneumococcal Polysaccharide-23 05/11/2004, 06/12/2011  . Td 02/08/2006, 09/08/2015  . Zoster 06/16/2010   Screening Tests Health Maintenance  Topic Date Due  . INFLUENZA VACCINE  11/09/2015  . MAMMOGRAM  01/04/2017  . COLONOSCOPY  04/26/2017  . TETANUS/TDAP  09/07/2025  . DEXA SCAN  Completed  . ZOSTAVAX  Completed  . Hepatitis C Screening  Completed  . PNA vac Low Risk Adult  Completed      Plan:    Continue to eat heart healthy diet (full of fruits, vegetables, whole grains, lean protein, water--limit salt, fat, and sugar intake) and increase physical activity as tolerated.  Continue doing brain stimulating activities (puzzles, reading, adult coloring books, staying active) to keep memory sharp.   Follow-up with Dr. Larose Kells as scheduled.  Bring a copy of advanced directives to your next visit.  Schedule your mammogram towards the end of September.  During the course of the visit the patient was educated and counseled about the following appropriate screening and preventive services:   Vaccines to include Pneumoccal, Influenza, Hepatitis B, Td, Zostavax, HCV  Cardiovascular Disease  Colorectal cancer screening  Bone density screening  Diabetes screening  Glaucoma screening  Mammography/PAP  Nutrition counseling    Patient Instructions (the written plan) was given to the patient.   Dorrene German, RN  12/01/2015   Kathlene November, MD

## 2015-12-01 NOTE — Patient Instructions (Signed)
Continue to eat heart healthy diet (full of fruits, vegetables, whole grains, lean protein, water--limit salt, fat, and sugar intake) and increase physical activity as tolerated.  Continue doing brain stimulating activities (puzzles, reading, adult coloring books, staying active) to keep memory sharp.   Follow-up with Dr. Larose Kells as scheduled.  Bring a copy of advanced directives to your next visit.  Schedule your mammogram towards the end of September.

## 2015-12-07 ENCOUNTER — Other Ambulatory Visit: Payer: Self-pay | Admitting: Obstetrics & Gynecology

## 2015-12-07 DIAGNOSIS — Z1231 Encounter for screening mammogram for malignant neoplasm of breast: Secondary | ICD-10-CM

## 2016-01-04 ENCOUNTER — Ambulatory Visit (INDEPENDENT_AMBULATORY_CARE_PROVIDER_SITE_OTHER): Payer: PPO

## 2016-01-04 DIAGNOSIS — Z23 Encounter for immunization: Secondary | ICD-10-CM | POA: Diagnosis not present

## 2016-01-06 ENCOUNTER — Other Ambulatory Visit: Payer: Self-pay | Admitting: Obstetrics & Gynecology

## 2016-01-06 ENCOUNTER — Ambulatory Visit (HOSPITAL_BASED_OUTPATIENT_CLINIC_OR_DEPARTMENT_OTHER)
Admission: RE | Admit: 2016-01-06 | Discharge: 2016-01-06 | Disposition: A | Discharge status: Home / Self Care | Payer: PPO | Source: Ambulatory Visit | Attending: Obstetrics & Gynecology | Admitting: Obstetrics & Gynecology

## 2016-01-06 DIAGNOSIS — Z1231 Encounter for screening mammogram for malignant neoplasm of breast: Secondary | ICD-10-CM | POA: Diagnosis not present

## 2016-01-12 DIAGNOSIS — M7062 Trochanteric bursitis, left hip: Secondary | ICD-10-CM | POA: Diagnosis not present

## 2016-01-20 ENCOUNTER — Encounter: Payer: Self-pay | Admitting: Physical Therapy

## 2016-01-20 ENCOUNTER — Ambulatory Visit (INDEPENDENT_AMBULATORY_CARE_PROVIDER_SITE_OTHER): Payer: PPO | Admitting: Physical Therapy

## 2016-01-20 DIAGNOSIS — M25552 Pain in left hip: Secondary | ICD-10-CM

## 2016-01-20 DIAGNOSIS — R29898 Other symptoms and signs involving the musculoskeletal system: Secondary | ICD-10-CM | POA: Diagnosis not present

## 2016-01-20 NOTE — Patient Instructions (Addendum)
Bridging: with Straight Leg Raise    [place] [phone]    With legs bent, lift buttocks __10-12__ inches from floor. Then slowly extend right knee, keeping stomach tight. Repeat __10__ times per set. Do __2-3__ sets per session. Do __1__ sessions per day.  Strengthening: Hip Abduction (Side-Lying)    Tighten muscles on front of left thigh, then lift leg _12-16___ inches from surface, keeping knee locked.  Repeat _10__ times per set. Do __2-3__ sets per session. Do ___1_ sessions per day.  Side Leg Circle    Lie on side, back straight along edge of mat, legs 30 in front of torso. Lift top leg to hip height. Rotate in small circle, __15__ times in each direction. Repeat __1__ times. Repeat on other side. Do _1___ sessions per day.   Stretching: Piriformis (Supine)    Pull left knee toward opposite shoulder. Hold __30__ seconds. Relax. Repeat __1-2__ times per set. Do __1__ sets per session. Do __1__ sessions per day.

## 2016-01-20 NOTE — Therapy (Signed)
[place], [place], [address] Phone: [phone]   Fax:  [phone]  Physical Therapy Evaluation  Patient Details  Name: [name] MRN: [id number] Date of Birth: 07/31/1945 Referring Provider: Gerrit Halls PA  Encounter Date: 01/20/2016      PT End of Session - 01/20/16 0929    Visit Number 1   Number of Visits 8   Date for PT Re-Evaluation 02/17/16   PT Start Time 0930   PT Stop Time 1015   PT Time Calculation (min) 45 min      Past Medical History:  Diagnosis Date  . Chest pain    stress test @ [facility] cardiology (-)  . Colonic polyp    bx adenomatous polyps, next scope 2010  . Depression   . Fibromyalgia    see rheumatology  . Glaucoma   . Hypothyroidism   . Migraine   . 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 Osteopenia   . Pleuritic chest pain    etiology unclear, has beeb eval by pulmonary and rheumatology (autoimmune dz?)    Past Surgical History:  Procedure Laterality Date  . ESOPHAGOGASTRODUODENOSCOPY (EGD) WITH PROPOFOL N/A 03/27/2013   Procedure: ESOPHAGOGASTRODUODENOSCOPY (EGD) WITH PROPOFOL;  Surgeon: Milus Banister, MD;  Location: [facility];  Service: Endoscopy;  Laterality: N/A;  possible dil  . 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  . plastic surgery face  2013  . right rotator cuff repair  2010  . TONSILLECTOMY      There were no vitals filed for this visit.       Subjective Assessment - 01/20/16 0931    Subjective Pt reports that since she has seen [place] last in July she had a steroid injection in her back and it helped there and she noticed her Lt hip hurting more. Wanted a second opinion from someone who specializes in hips.  Received dx of Lt hip bursitis, had an injection and it has helped some.  Hasn't been icing the hip the way she is supposed to over the last couple of days.    Diagnostic tests MRI from 07/24/15 indicates multilevel DDD and DJD  with several areas of L nerve root impingement, x-ray hip arthritis   Patient Stated Goals dance on Dec 2nd and sleep on Lt side.   Currently in Pain? Yes   Pain Score 4   with walking up to 6/10   Pain Location Hip   Pain Orientation Left   Pain Descriptors / Indicators Aching;Dull   Pain Type Chronic pain   Pain Onset More than a month ago   Pain Frequency Constant   Aggravating Factors  walking, prolonged sitting   Pain Relieving Factors lying prone            [facility] PT Assessment - 01/20/16 0001      Assessment   Medical Diagnosis Lt hip bursitis   Referring Provider Gerrit Halls PA   Onset Date/Surgical Date 04/10/13   Next MD Visit PRN   Prior Therapy for her back     Precautions   Precautions None   Required Braces or Orthoses --  has orthotics however doesn't wear them     Balance Screen   Has the patient fallen in the past 6 months No   Has the patient had a decrease in activity level because of a fear of falling?  No   Is the patient reluctant to leave  their home because of a fear of falling?  No     Home Ecologist residence   Living Arrangements Spouse/significant other   Home Layout Two level  doesn't have to use them, noticed some trouble with Lt LE      Prior Function   Level of Independence Independent   Vocation Retired   Leisure denies regular exercise; enjoys playing cards and movies; only exercise includes housework and walking dogs     Observation/Other Assessments   Focus on Therapeutic Outcomes (FOTO)  62% limited     Functional Tests   Functional tests Squat;Single leg stance     Squat   Comments slight shift to the Rt     Single Leg Stance   Comments bilat 13 sec, more accessory motion when on Lt LE     Posture/Postural Control   Posture Comments posterior pelvic tilt with reduced lumbar lordosis  pes planus     ROM / Strength   AROM / PROM / Strength AROM;Strength     AROM   Overall AROM  Comments slight tightness in Lt hip IR    Lumbar Flexion 2" from the floor    Lumbar Extension WNL     Strength   Strength Assessment Site Hip;Knee;Ankle;Lumbar   Right Hip Flexion 5/5   Right Hip Extension 4+/5   Right Hip ABduction 4+/5   Left Hip Flexion 4+/5   Left Hip Extension 4/5   Left Hip ABduction 3-/5   Right Knee Flexion 5/5   Right Knee Extension 5/5   Left Knee Flexion --  5-/5   Left Knee Extension 4/5   Right/Left Ankle --  bilat WNL   Lumbar Extension --  multifidi good     Flexibility   Soft Tissue Assessment /Muscle Length yes   Hamstrings supine SLR Rt 80, Lt 85   Quadriceps prone knee flex Rt 112, Lt 122                   [facility] Adult PT Treatment/Exercise - 01/20/16 0001      Exercises   Exercises Knee/Hip     Lumbar Exercises: Supine   Bridge 20 reps  figure 4 bridges, Lt side     Lumbar Exercises: Sidelying   Hip Abduction 20 reps  VC for form   Other Sidelying Lumbar Exercises 10 reps pilates circles     Knee/Hip Exercises: Stretches   Piriformis Stretch Left;30 seconds  knee to opposite shoulder     Modalities   Modalities Iontophoresis     Iontophoresis   Type of Iontophoresis Dexamethasone   Location posterior to Lt greater troch   Dose 1.0cc   Time 8 hr patch 116mA                  PT Short Term Goals - 06/01/15 0840      PT SHORT TERM GOAL #1   Title pt independent with initial HEP by 05/27/15   Status Achieved           PT Long Term Goals - 01/20/16 1021      PT LONG TERM GOAL #1   Title pt independent with advanced HEP by 02/17/16   Time 4   Period Weeks   Status New     PT LONG TERM GOAL #2   Title pt displays B LE MMT 5-/5 or better without c/o hip pain by 02/17/16   Time 4   Period Weeks  Status New     PT LONG TERM GOAL #3   Title perform easy dance moves without Lt hip pain ( 02/17/16)    Time 4   Period Weeks   Status New     PT LONG TERM GOAL #4   Title sleep on left side  without Lt hip pain ( 02/17/16)    Time 4   Period Weeks   Status New     PT LONG TERM GOAL #5   Title improve FOTO =/< 44% limited, CK level ( 02/17/16)    Time 4   Period Weeks   Status New               Plan - 01/20/16 1019    Clinical Impression Statement [name] returns for therapy for Lt hip bursitis, she states her back is feeling better.  She has weakness around her Lt hip along with pain and some tightness.    Rehab Potential Good   PT Frequency 2x / week   PT Duration 4 weeks   PT Treatment/Interventions Therapeutic exercise;Therapeutic activities;Manual techniques;Moist Heat;Traction;Taping;Patient/family education;Neuromuscular re-education;Functional mobility training;Electrical Stimulation;Cryotherapy;Dry needling;Iontophoresis 4mg /ml Dexamethasone   PT Next Visit Plan [place], ionto Lt hip, strengthening    Consulted and Agree with Plan of Care Patient      Patient will benefit from skilled therapeutic intervention in order to improve the following deficits and impairments:  Decreased strength, Pain, Difficulty walking  Visit Diagnosis: Pain in left hip - Plan: PT plan of care cert/re-cert  Weakness of both hips - Plan: PT plan of care cert/re-cert     Problem List Patient Active Problem List   Diagnosis Date Noted  . PCP NOTES >>>>>>>>>>>>>>>>>>>> 05/24/2015  . Depression with anxiety 05/22/2015  . Numbness and tingling of right arm 05/22/2015  . Pain in the chest   . Chest pain 05/21/2015  . Insomnia 09/01/2014  . GERD (gastroesophageal reflux disease) 03/27/2013  . Dysphagia, pharyngoesophageal phase 03/27/2013  . Annual physical exam 06/12/2011  . Sinusitis, chronic 02/02/2011  . Osteoporosis 06/08/2010  . Fibromyalgia, pain mngmt 01/29/2009  . LIPOMA 09/30/2008  . DEPRESSION 10/29/2007  . Migraine without aura 03/04/2007  . Hypothyroidism 05/15/2006  . COLONIC POLYPS, HX OF 05/15/2006    [name] PT  01/20/2016, 10:26 AM  [facility] [place], [place], [address] Phone: [phone]   Fax:  [phone]  Name: [name] MRN: [id number] Date of Birth: Jan 19, 1946

## 2016-01-26 ENCOUNTER — Ambulatory Visit (INDEPENDENT_AMBULATORY_CARE_PROVIDER_SITE_OTHER): Payer: PPO | Admitting: Physical Therapy

## 2016-01-26 ENCOUNTER — Encounter: Payer: Self-pay | Admitting: Physical Therapy

## 2016-01-26 DIAGNOSIS — M25552 Pain in left hip: Secondary | ICD-10-CM | POA: Diagnosis not present

## 2016-01-26 DIAGNOSIS — R29898 Other symptoms and signs involving the musculoskeletal system: Secondary | ICD-10-CM

## 2016-01-26 NOTE — Therapy (Signed)
[place], [place], [address] Phone: [phone]   Fax:  [phone]  Physical Therapy Treatment  Patient Details  Name: [name] MRN: [id number] Date of Birth: 01/11/1946 Referring Provider: Gerrit Halls PA  Encounter Date: 01/26/2016      PT End of Session - 01/26/16 1022    Visit Number 2   Number of Visits 8   Date for PT Re-Evaluation 02/17/16   PT Start Time 1022   PT Stop Time 1106   PT Time Calculation (min) 44 min   Activity Tolerance Patient tolerated treatment well      Past Medical History:  Diagnosis Date  . Chest pain    stress test @ [facility] cardiology (-)  . Colonic polyp    bx adenomatous polyps, next scope 2010  . Depression   . Fibromyalgia    see rheumatology  . Glaucoma   . Hypothyroidism   . Migraine   . 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 Osteopenia   . Pleuritic chest pain    etiology unclear, has beeb eval by pulmonary and rheumatology (autoimmune dz?)    Past Surgical History:  Procedure Laterality Date  . ESOPHAGOGASTRODUODENOSCOPY (EGD) WITH PROPOFOL N/A 03/27/2013   Procedure: ESOPHAGOGASTRODUODENOSCOPY (EGD) WITH PROPOFOL;  Surgeon: Milus Banister, MD;  [facility];  Service: Endoscopy;  Laterality: N/A;  possible dil  . 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  . plastic surgery face  2013  . right rotator cuff repair  2010  . TONSILLECTOMY      There were no vitals filed for this visit.      Subjective Assessment - 01/26/16 1022    Subjective Pt states she thinks she did an exercise wrong and had increased pain at that time. She rested yesterday and it has settled down some. She has no pain after her first session.     Patient Stated Goals dance on Dec 2nd and sleep on Lt side.   Currently in Pain? Yes   Pain Score 4    Pain Location Hip   Pain Orientation Left   Pain Descriptors / Indicators Aching;Burning   Pain Type  Chronic pain   Pain Onset More than a month ago   Pain Frequency Constant   Aggravating Factors  not sure which exercise    Pain Relieving Factors rest                          [facility] Adult PT Treatment/Exercise - 01/26/16 0001      Knee/Hip Exercises: Stretches   Piriformis Stretch Right;1 rep;30 seconds  reviewed form, informed pt not to bounce & not rotate as muc     Knee/Hip Exercises: Aerobic   Nustep L3x5'  pain into Lt calf with bringing leg up towards body     Knee/Hip Exercises: Supine   Bridges Limitations single leg bridges, Rt LE   recommend not holding as long, limits HS spasming     Knee/Hip Exercises: Sidelying   Hip ABduction Strengthening;Right;10 reps  VC for form, keep hip FWD.   Other Sidelying Knee/Hip Exercises CW/CCW circles, VC for form, rolling FWD more to not sure front of the thigh     Modalities   Modalities Iontophoresis;Ultrasound;Cryotherapy     Cryotherapy   Number Minutes Cryotherapy 10 Minutes   Cryotherapy Location --  hip Rt lateral in s/l   Type of  Cryotherapy Ice pack     Ultrasound   Ultrasound Location Lt greater troc/gluts   Ultrasound Parameters 50%, 1.754mz, 1.0w/cm2   Ultrasound Goals Pain     Iontophoresis   Type of Iontophoresis Dexamethasone   Location posterior to Lt greater troch   Dose 1.0cc   Time 8 hr patch 128m                    PT Long Term Goals - 01/26/16 1027      PT LONG TERM GOAL #1   Title pt independent with advanced HEP by 02/17/16   Status On-going     PT LONG TERM GOAL #2   Title pt displays B LE MMT 5-/5 or better without c/o hip pain by 02/17/16   Status On-going     PT LONG TERM GOAL #3   Title perform easy dance moves without Lt hip pain ( 02/17/16)    Status On-going     PT LONG TERM GOAL #4   Title sleep on left side without Lt hip pain ( 02/17/16)    Status On-going     PT LONG TERM GOAL #5   Title improve FOTO =/< 44% limited, CK level ( 02/17/16)     Status On-going               Plan - 01/26/16 1058    Clinical Impression Statement [name] did very well after first treatment and then her pain returned.  She wasn't performing her HEP correctly.  She was reinstructed and inforced proper form. She had relief from [place] ionto  No goals met, only second visit .    Rehab Potential Good   PT Frequency 2x / week   PT Duration 4 weeks   PT Treatment/Interventions Therapeutic exercise;Therapeutic activities;Manual techniques;Moist Heat;Traction;Taping;Patient/family education;Neuromuscular re-education;Functional mobility training;Electrical Stimulation;Cryotherapy;Dry needling;Iontophoresis 54m83ml Dexamethasone   PT Next Visit Plan [place] Lt hip, strengthening    Consulted and Agree with Plan of Care Patient      Patient will benefit from skilled therapeutic intervention in order to improve the following deficits and impairments:  Decreased strength, Pain, Difficulty walking  Visit Diagnosis: Pain in left hip  Weakness of both hips     Problem List Patient Active Problem List   Diagnosis Date Noted  . PCP NOTES >>>>>>>>>>>>>>>>>>>> 05/24/2015  . Depression with anxiety 05/22/2015  . Numbness and tingling of right arm 05/22/2015  . Pain in the chest   . Chest pain 05/21/2015  . Insomnia 09/01/2014  . GERD (gastroesophageal reflux disease) 03/27/2013  . Dysphagia, pharyngoesophageal phase 03/27/2013  . Annual physical exam 06/12/2011  . Sinusitis, chronic 02/02/2011  . Osteoporosis 06/08/2010  . Fibromyalgia, pain mngmt 01/29/2009  . LIPOMA 09/30/2008  . DEPRESSION 10/29/2007  . Migraine without aura 03/04/2007  . Hypothyroidism 05/15/2006  . COLONIC POLYPS, HX OF 05/15/2006    [name]  01/26/2016, 10:59 AM  [name]one: [phone]:  [phone]: [name]: [phone]  of Birth: [birth year]

## 2016-01-28 ENCOUNTER — Ambulatory Visit (INDEPENDENT_AMBULATORY_CARE_PROVIDER_SITE_OTHER): Payer: PPO | Admitting: Physical Therapy

## 2016-01-28 DIAGNOSIS — M25552 Pain in left hip: Secondary | ICD-10-CM

## 2016-01-28 DIAGNOSIS — R29898 Other symptoms and signs involving the musculoskeletal system: Secondary | ICD-10-CM

## 2016-01-28 NOTE — Therapy (Signed)
[place], [place], [address] Phone: [phone]   Fax:  [phone]  Physical Therapy Treatment  Patient Details  Name: [name] MRN: [id number] Date of Birth: 12/16/1945 Referring Provider: Gerrit Halls PA  Encounter Date: 01/28/2016      PT End of Session - 01/28/16 1107    Visit Number 3   Number of Visits 8   Date for PT Re-Evaluation 02/17/16   PT Start Time 1105   PT Stop Time 1148   PT Time Calculation (min) 43 min   Activity Tolerance Patient tolerated treatment well      Past Medical History:  Diagnosis Date  . Chest pain    stress test @ [facility] cardiology (-)  . Colonic polyp    bx adenomatous polyps, next scope 2010  . Depression   . Fibromyalgia    see rheumatology  . Glaucoma   . Hypothyroidism   . Migraine   . 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 Osteopenia   . Pleuritic chest pain    etiology unclear, has beeb eval by pulmonary and rheumatology (autoimmune dz?)    Past Surgical History:  Procedure Laterality Date  . ESOPHAGOGASTRODUODENOSCOPY (EGD) WITH PROPOFOL N/A 03/27/2013   Procedure: ESOPHAGOGASTRODUODENOSCOPY (EGD) WITH PROPOFOL;  Surgeon: Milus Banister, MD;  [facility];  Service: Endoscopy;  Laterality: N/A;  possible dil  . 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  . plastic surgery face  2013  . right rotator cuff repair  2010  . TONSILLECTOMY      There were no vitals filed for this visit.      Subjective Assessment - 01/28/16 1107    Subjective Pt went for a walk (shopping) and it made hip worse.  She also mentioned the hip stretch has made pain worse, so she is not doing this anymore.     Patient Stated Goals dance on Dec 2nd and sleep on Lt side.   Currently in Pain? Yes   Pain Score 3    Pain Location Hip   Pain Orientation Left;Posterior;Lateral   Pain Descriptors / Indicators Aching;Burning            [facility] PT  Assessment - 01/28/16 0001      Assessment   Medical Diagnosis Lt hip bursitis   Onset Date/Surgical Date 04/10/13   Next MD Visit PRN   Prior Therapy for her back            [facility] Adult PT Treatment/Exercise - 01/28/16 0001      Lumbar Exercises: Supine   Bridge 10 reps;5 seconds     Lumbar Exercises: Sidelying   Other Sidelying Lumbar Exercises Lt hip clams with 3 sec hold x 8 reps      Knee/Hip Exercises: Stretches   Passive Hamstring Stretch Left;60 seconds;3 reps   Piriformis Stretch Left;3 reps;30 seconds  with RLE in hooklying, supporting LLE. then in sitting.     Knee/Hip Exercises: Aerobic   Nustep L4: 5 min (arms and legs)     Ultrasound   Ultrasound Location Lt greater trochanter/ piriformis    Ultrasound Parameters 50%, 1.0 w/cm2, 1.0 mHz    Ultrasound Goals Pain     Iontophoresis   Type of Iontophoresis Dexamethasone   Location Lt piriformis origin   Dose 1.0cc   Time 8 hr patch 135mA           PT Long Term Goals -  01/26/16 1027      PT LONG TERM GOAL #1   Title pt independent with advanced HEP by 02/17/16   Status On-going     PT LONG TERM GOAL #2   Title pt displays B LE MMT 5-/5 or better without c/o hip pain by 02/17/16   Status On-going     PT LONG TERM GOAL #3   Title perform easy dance moves without Lt hip pain ( 02/17/16)    Status On-going     PT LONG TERM GOAL #4   Title sleep on left side without Lt hip pain ( 02/17/16)    Status On-going     PT LONG TERM GOAL #5   Title improve FOTO =/< 44% limited, CK level ( 02/17/16)    Status On-going               Plan - 01/28/16 1254    Clinical Impression Statement Pt now complaining of pain at Lt piriformis origin, less at greater trochanter.  Made some more modifications of exercises to increase comfort and efficiency.  Pt reported relief of pain at end of session.     Rehab Potential Good   PT Frequency 2x / week   PT Duration 4 weeks   PT Treatment/Interventions  Therapeutic exercise;Therapeutic activities;Manual techniques;Moist Heat;Traction;Taping;Patient/family education;Neuromuscular re-education;Functional mobility training;Electrical Stimulation;Cryotherapy;Dry needling;Iontophoresis 4mg /ml Dexamethasone   PT Next Visit Plan [place], ionto Lt hip, strengthening    Consulted and Agree with Plan of Care Patient      Patient will benefit from skilled therapeutic intervention in order to improve the following deficits and impairments:  Decreased strength, Pain, Difficulty walking  Visit Diagnosis: Pain in left hip  Weakness of both hips     Problem List Patient Active Problem List   Diagnosis Date Noted  . PCP NOTES >>>>>>>>>>>>>>>>>>>> 05/24/2015  . Depression with anxiety 05/22/2015  . Numbness and tingling of right arm 05/22/2015  . Pain in the chest   . Chest pain 05/21/2015  . Insomnia 09/01/2014  . GERD (gastroesophageal reflux disease) 03/27/2013  . Dysphagia, pharyngoesophageal phase 03/27/2013  . Annual physical exam 06/12/2011  . Sinusitis, chronic 02/02/2011  . Osteoporosis 06/08/2010  . Fibromyalgia, pain mngmt 01/29/2009  . LIPOMA 09/30/2008  . DEPRESSION 10/29/2007  . Migraine without aura 03/04/2007  . Hypothyroidism 05/15/2006  . COLONIC POLYPS, HX OF 05/15/2006   Kerin Perna, PTA 01/28/16 1:01 PM  [facility] [place], [place], [address] Phone: [phone]   Fax:  [phone]  Name: [name] MRN: [id number] Date of Birth: 1946-01-11

## 2016-02-01 ENCOUNTER — Encounter: Payer: Self-pay | Admitting: Physical Therapy

## 2016-02-01 ENCOUNTER — Ambulatory Visit (INDEPENDENT_AMBULATORY_CARE_PROVIDER_SITE_OTHER): Payer: PPO | Admitting: Physical Therapy

## 2016-02-01 DIAGNOSIS — R29898 Other symptoms and signs involving the musculoskeletal system: Secondary | ICD-10-CM

## 2016-02-01 DIAGNOSIS — M25552 Pain in left hip: Secondary | ICD-10-CM

## 2016-02-01 NOTE — Therapy (Signed)
[place], [place], [address] Phone: [phone]   Fax:  [phone]  Physical Therapy Treatment  Patient Details  Name: [name] MRN: [id number] Date of Birth: Nov 06, 1945 Referring Provider: Gerrit Halls PA  Encounter Date: 02/01/2016      PT End of Session - 02/01/16 0934    Visit Number 4   Number of Visits 8   Date for PT Re-Evaluation 02/17/16   PT Start Time 0934   PT Stop Time 1017   PT Time Calculation (min) 43 min   Activity Tolerance Patient tolerated treatment well      Past Medical History:  Diagnosis Date  . Chest pain    stress test @ [facility] cardiology (-)  . Colonic polyp    bx adenomatous polyps, next scope 2010  . Depression   . Fibromyalgia    see rheumatology  . Glaucoma   . Hypothyroidism   . Migraine   . 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 Osteopenia   . Pleuritic chest pain    etiology unclear, has beeb eval by pulmonary and rheumatology (autoimmune dz?)    Past Surgical History:  Procedure Laterality Date  . ESOPHAGOGASTRODUODENOSCOPY (EGD) WITH PROPOFOL N/A 03/27/2013   Procedure: ESOPHAGOGASTRODUODENOSCOPY (EGD) WITH PROPOFOL;  Surgeon: Milus Banister, MD;  [facility]: [facility];  Service: Endoscopy;  Laterality: N/A;  possible dil  . 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  . plastic surgery face  2013  . right rotator cuff repair  2010  . TONSILLECTOMY      There were no vitals filed for this visit.      Subjective Assessment - 02/01/16 0934    Subjective [name] reports that her back has settled back down.  The pain is localized in her Lt hip now.    Patient Stated Goals dance on Dec 2nd and sleep on Lt side.   Currently in Pain? Yes   Pain Score 3    Pain Location Hip   Pain Orientation Left   Pain Descriptors / Indicators Aching;Burning   Pain Type Chronic pain   Pain Onset More than a month ago   Pain Frequency Constant   Aggravating Factors  thinks some of the exercise may be bothering her -not sure    Pain Relieving Factors ice, ultasound                         [facility] Adult PT Treatment/Exercise - 02/01/16 0001      Knee/Hip Exercises: Stretches   Hip Flexor Stretch 30 seconds;3 reps     Knee/Hip Exercises: Aerobic   Nustep L5x1.5' then L4x4'     Knee/Hip Exercises: Standing   Lateral Step Up Step Height: 8";20 reps  Lt LE     Ultrasound   Ultrasound Location Lt greater troch   Ultrasound Parameters 50% , 1.82mHz, 1.0w/cm2   Ultrasound Goals Pain     Iontophoresis   Type of Iontophoresis Dexamethasone   Location posterior to Lt greater troch   Dose 1.0cc   Time 8 hr patch 167mA     Manual Therapy   Manual Therapy Soft tissue mobilization   Soft tissue mobilization Lt piriformis and gluts                     PT Long Term Goals - 02/01/16 0939      PT LONG TERM GOAL #1  Title pt independent with advanced HEP by 02/17/16   Status On-going     PT LONG TERM GOAL #2   Title pt displays B LE MMT 5-/5 or better without c/o hip pain by 02/17/16     PT LONG TERM GOAL #3   Title perform easy dance moves without Lt hip pain ( 02/17/16)    Status On-going     PT LONG TERM GOAL #4   Title sleep on left side without Lt hip pain ( 02/17/16)    Status On-going     PT LONG TERM GOAL #5   Title improve FOTO =/< 44% limited, CK level ( 02/17/16)    Status On-going               Plan - 02/01/16 1021    Clinical Impression Statement This is [name]'s 4th visit, the episode of back pain from last week has settled down to her baseline.  Her Lt hip continues to be painfull.  She has a tight joint capsule and would benefit from more mobs to the Lt hip .  Tolerates [place] & iontophoresis well.    Rehab Potential Good   PT Frequency 2x / week   PT Duration 4 weeks   PT Treatment/Interventions Therapeutic exercise;Therapeutic activities;Manual techniques;Moist  Heat;Traction;Taping;Patient/family education;Neuromuscular re-education;Functional mobility training;Electrical Stimulation;Cryotherapy;Dry needling;Iontophoresis 4mg /ml Dexamethasone   PT Next Visit Plan Lt hip mobs, [place], ionto - has had 4 patches.    Consulted and Agree with Plan of Care Patient      Patient will benefit from skilled therapeutic intervention in order to improve the following deficits and impairments:  Decreased strength, Pain, Difficulty walking  Visit Diagnosis: Pain in left hip  Weakness of both hips     Problem List Patient Active Problem List   Diagnosis Date Noted  . PCP NOTES >>>>>>>>>>>>>>>>>>>> 05/24/2015  . Depression with anxiety 05/22/2015  . Numbness and tingling of right arm 05/22/2015  . Pain in the chest   . Chest pain 05/21/2015  . Insomnia 09/01/2014  . GERD (gastroesophageal reflux disease) 03/27/2013  . Dysphagia, pharyngoesophageal phase 03/27/2013  . Annual physical exam 06/12/2011  . Sinusitis, chronic 02/02/2011  . Osteoporosis 06/08/2010  . Fibromyalgia, pain mngmt 01/29/2009  . LIPOMA 09/30/2008  . DEPRESSION 10/29/2007  . Migraine without aura 03/04/2007  . Hypothyroidism 05/15/2006  . COLONIC POLYPS, HX OF 05/15/2006    [name] PT  02/01/2016, 10:23 AM  [facility] [place], [place], [address] Phone: [phone]   Fax:  [phone]  Name: [name] MRN: [id number] Date of Birth: 1946/01/22

## 2016-02-04 ENCOUNTER — Ambulatory Visit (INDEPENDENT_AMBULATORY_CARE_PROVIDER_SITE_OTHER): Payer: PPO | Admitting: Physical Therapy

## 2016-02-04 ENCOUNTER — Encounter: Payer: Self-pay | Admitting: Physical Therapy

## 2016-02-04 DIAGNOSIS — M25552 Pain in left hip: Secondary | ICD-10-CM | POA: Diagnosis not present

## 2016-02-04 DIAGNOSIS — R29898 Other symptoms and signs involving the musculoskeletal system: Secondary | ICD-10-CM | POA: Diagnosis not present

## 2016-02-04 NOTE — Therapy (Signed)
[place], [place], [address] Phone: [phone]   Fax:  [phone]  Physical Therapy Treatment  Patient Details  Name: [name] MRN: [id number] Date of Birth: November 04, 1945 Referring Provider: Gerrit Halls PA  Encounter Date: 02/04/2016      PT End of Session - 02/04/16 1021    Visit Number 5   Number of Visits 8   Date for PT Re-Evaluation 02/17/16   PT Start Time 1021   PT Stop Time 1100   PT Time Calculation (min) 39 min   Activity Tolerance Patient tolerated treatment well      Past Medical History:  Diagnosis Date  . Chest pain    stress test @ [facility] cardiology (-)  . Colonic polyp    bx adenomatous polyps, next scope 2010  . Depression   . Fibromyalgia    see rheumatology  . Glaucoma   . Hypothyroidism   . Migraine   . 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 Osteopenia   . Pleuritic chest pain    etiology unclear, has beeb eval by pulmonary and rheumatology (autoimmune dz?)    Past Surgical History:  Procedure Laterality Date  . ESOPHAGOGASTRODUODENOSCOPY (EGD) WITH PROPOFOL N/A 03/27/2013   Procedure: ESOPHAGOGASTRODUODENOSCOPY (EGD) WITH PROPOFOL;  Surgeon: Milus Banister, MD;  Location: [facility];  Service: Endoscopy;  Laterality: N/A;  possible dil  . 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  . plastic surgery face  2013  . right rotator cuff repair  2010  . TONSILLECTOMY      There were no vitals filed for this visit.      Subjective Assessment - 02/04/16 1028    Subjective Pt reports she is feeling good, patch helped.    Currently in Pain? Yes   Pain Score 2    Pain Location Hip   Pain Orientation Left   Pain Descriptors / Indicators Aching                         [facility] Adult PT Treatment/Exercise - 02/04/16 0001      Knee/Hip Exercises: Aerobic   Nustep L4x5'     Knee/Hip Exercises: Supine   Bridges Limitations 3x10 figure 4  bridges with Lt LE     Knee/Hip Exercises: Sidelying   Clams 3x10 reverse clams, 3#, then 10 reps regular.      Modalities   Modalities Iontophoresis;Ultrasound     Ultrasound   Ultrasound Location Lt greater troch   Ultrasound Parameters 50%, 1.107mHz, 1.0w/cm2   Ultrasound Goals Pain     Iontophoresis   Type of Iontophoresis Dexamethasone   Location posterior to Lt greater troch   Dose 1.0cc   Time 8 hr patch 154mA                     PT Long Term Goals - 02/01/16 0939      PT LONG TERM GOAL #1   Title pt independent with advanced HEP by 02/17/16   Status On-going     PT LONG TERM GOAL #2   Title pt displays B LE MMT 5-/5 or better without c/o hip pain by 02/17/16     PT LONG TERM GOAL #3   Title perform easy dance moves without Lt hip pain ( 02/17/16)    Status On-going     PT LONG TERM GOAL #4   Title sleep on  left side without Lt hip pain ( 02/17/16)    Status On-going     PT LONG TERM GOAL #5   Title improve FOTO =/< 44% limited, CK level ( 02/17/16)    Status On-going               Plan - 02/04/16 1059    Clinical Impression Statement [name] continues to have improvement, tolerates treatment well.    Rehab Potential Good   PT Frequency 2x / week   PT Duration 4 weeks   PT Treatment/Interventions Therapeutic exercise;Therapeutic activities;Manual techniques;Moist Heat;Traction;Taping;Patient/family education;Neuromuscular re-education;Functional mobility training;Electrical Stimulation;Cryotherapy;Dry needling;Iontophoresis 4mg /ml Dexamethasone   PT Next Visit Plan Lt hip mobs, [place], ionto - has had 5 patches.    Consulted and Agree with Plan of Care Patient      Patient will benefit from skilled therapeutic intervention in order to improve the following deficits and impairments:  Decreased strength, Pain, Difficulty walking  Visit Diagnosis: Pain in left hip  Weakness of both hips     Problem List Patient Active Problem List    Diagnosis Date Noted  . PCP NOTES >>>>>>>>>>>>>>>>>>>> 05/24/2015  . Depression with anxiety 05/22/2015  . Numbness and tingling of right arm 05/22/2015  . Pain in the chest   . Chest pain 05/21/2015  . Insomnia 09/01/2014  . GERD (gastroesophageal reflux disease) 03/27/2013  . Dysphagia, pharyngoesophageal phase 03/27/2013  . Annual physical exam 06/12/2011  . Sinusitis, chronic 02/02/2011  . Osteoporosis 06/08/2010  . Fibromyalgia, pain mngmt 01/29/2009  . LIPOMA 09/30/2008  . DEPRESSION 10/29/2007  . Migraine without aura 03/04/2007  . Hypothyroidism 05/15/2006  . COLONIC POLYPS, HX OF 05/15/2006    [name] PT  02/04/2016, 11:01 AM  [facility] [place], [place], [address] Phone: [phone]   Fax:  [phone]  Name: [name] MRN: [id number] Date of Birth: 1946/01/04

## 2016-02-08 ENCOUNTER — Ambulatory Visit (INDEPENDENT_AMBULATORY_CARE_PROVIDER_SITE_OTHER): Payer: PPO | Admitting: Physical Therapy

## 2016-02-08 ENCOUNTER — Encounter: Payer: Self-pay | Admitting: Physical Therapy

## 2016-02-08 DIAGNOSIS — R29898 Other symptoms and signs involving the musculoskeletal system: Secondary | ICD-10-CM

## 2016-02-08 DIAGNOSIS — M25552 Pain in left hip: Secondary | ICD-10-CM

## 2016-02-08 NOTE — Therapy (Signed)
[place], [place], [address] Phone: [phone]   Fax:  [phone]  Physical Therapy Treatment  Patient Details  Name: [name] MRN: [id number] Date of Birth: Sep 20, 1945 Referring Provider: Gerrit Halls PA  Encounter Date: 02/08/2016      PT End of Session - 02/08/16 0933    Visit Number 6   Number of Visits 8   Date for PT Re-Evaluation 02/17/16   PT Start Time 0933   PT Stop Time 1015   PT Time Calculation (min) 42 min   Activity Tolerance Patient limited by fatigue      Past Medical History:  Diagnosis Date  . Chest pain    stress test @ [facility] cardiology (-)  . Colonic polyp    bx adenomatous polyps, next scope 2010  . Depression   . Fibromyalgia    see rheumatology  . Glaucoma   . Hypothyroidism   . Migraine   . 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 Osteopenia   . Pleuritic chest pain    etiology unclear, has beeb eval by pulmonary and rheumatology (autoimmune dz?)    Past Surgical History:  Procedure Laterality Date  . ESOPHAGOGASTRODUODENOSCOPY (EGD) WITH PROPOFOL N/A 03/27/2013   Procedure: ESOPHAGOGASTRODUODENOSCOPY (EGD) WITH PROPOFOL;  Surgeon: Milus Banister, MD;  [facility]: [facility];  Service: Endoscopy;  Laterality: N/A;  possible dil  . 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  . plastic surgery face  2013  . right rotator cuff repair  2010  . TONSILLECTOMY      There were no vitals filed for this visit.      Subjective Assessment - 02/08/16 0933    Subjective [name] reports she had a reaction to the patch, itchy and burny, didn't check her skin.  Fibromyalgia flare - achy all over the place.    Patient Stated Goals dance on Dec 2nd and sleep on Lt side.   Currently in Pain? Yes   Pain Score 4    Pain Location Hip   Pain Orientation Left   Pain Descriptors / Indicators Aching   Pain Type Chronic pain   Pain Onset More than a month ago   Pain  Frequency Constant   Aggravating Factors  not sure, having a fibromyalgia flare up today   Pain Relieving Factors ice                         [facility] Adult PT Treatment/Exercise - 02/08/16 0001      Exercises   Exercises Lumbar;Knee/Hip     Lumbar Exercises: Stretches   Passive Hamstring Stretch 1 rep  bilat with strap,    Passive Hamstring Stretch Limitations developed Rt hip flex spasm, did leg lengtheners x 10   Press Ups --  10 reps   ITB Stretch 1 rep  cross body stretch     Lumbar Exercises: Supine   Other Supine Lumbar Exercises 10 reps leg presses     Knee/Hip Exercises: Aerobic   Nustep L5x5'     Modalities   Modalities Iontophoresis;Ultrasound     Ultrasound   Ultrasound Location Lt greater troch   Ultrasound Parameters 50%, 1.25mHz, 1.0 w/cm2   Ultrasound Goals Pain     Iontophoresis   Type of Iontophoresis Dexamethasone   Location posterior to Lt greater troch   Dose 1.0cc   Time 8 hr patch 69mA  PT Long Term Goals - 02/08/16 0936      PT LONG TERM GOAL #1   Title pt independent with advanced HEP by 02/17/16   Status On-going     PT LONG TERM GOAL #3   Title perform easy dance moves without Lt hip pain ( 02/17/16)    Status On-going     PT LONG TERM GOAL #4   Title sleep on left side without Lt hip pain ( 02/17/16)    Status On-going  attempted, was able to lie on it for a couple of minutes ( this is improvement )      PT LONG TERM GOAL #5   Title improve FOTO =/< 44% limited, CK level ( 02/17/16)    Status On-going               Plan - 02/08/16 1015    Clinical Impression Statement [name] is feeling overall fatigue and achiness due to fibromyalgia flare up today.  Used a less powerful patch on her hip as she had some itchiness/burning from the last patch.  Slow progress to goals. Pain biggest limiting factor at this time.    Rehab Potential Good   PT Frequency 2x / week   PT Duration 4 weeks    PT Treatment/Interventions Therapeutic exercise;Therapeutic activities;Manual techniques;Moist Heat;Traction;Taping;Patient/family education;Neuromuscular re-education;Functional mobility training;Electrical Stimulation;Cryotherapy;Dry needling;Iontophoresis 4mg /ml Dexamethasone   PT Next Visit Plan assess hip strength and response to 40mA patch   Consulted and Agree with Plan of Care Patient      Patient will benefit from skilled therapeutic intervention in order to improve the following deficits and impairments:  Decreased strength, Pain, Difficulty walking  Visit Diagnosis: Pain in left hip  Weakness of both hips     Problem List Patient Active Problem List   Diagnosis Date Noted  . PCP NOTES >>>>>>>>>>>>>>>>>>>> 05/24/2015  . Depression with anxiety 05/22/2015  . Numbness and tingling of right arm 05/22/2015  . Pain in the chest   . Chest pain 05/21/2015  . Insomnia 09/01/2014  . GERD (gastroesophageal reflux disease) 03/27/2013  . Dysphagia, pharyngoesophageal phase 03/27/2013  . Annual physical exam 06/12/2011  . Sinusitis, chronic 02/02/2011  . Osteoporosis 06/08/2010  . Fibromyalgia, pain mngmt 01/29/2009  . LIPOMA 09/30/2008  . DEPRESSION 10/29/2007  . Migraine without aura 03/04/2007  . Hypothyroidism 05/15/2006  . COLONIC POLYPS, HX OF 05/15/2006    [name] PT 02/08/2016, 10:19 AM  [facility] [place], [place], [address] Phone: [phone]   Fax:  [phone]  Name: [name] MRN: [id number] Date of Birth: October 03, 1945

## 2016-02-10 ENCOUNTER — Ambulatory Visit (INDEPENDENT_AMBULATORY_CARE_PROVIDER_SITE_OTHER): Payer: PPO | Admitting: Physical Therapy

## 2016-02-10 DIAGNOSIS — M25552 Pain in left hip: Secondary | ICD-10-CM | POA: Diagnosis not present

## 2016-02-10 DIAGNOSIS — R29898 Other symptoms and signs involving the musculoskeletal system: Secondary | ICD-10-CM

## 2016-02-10 NOTE — Therapy (Signed)
[place], [place], [address] Phone: [phone]   Fax:  [phone]  Physical Therapy Treatment  Patient Details  Name: [name] MRN: [id number] Date of Birth: Sep 03, 1945 Referring Provider: Gerrit Halls PA  Encounter Date: 02/10/2016      PT End of Session - 02/10/16 1021    Visit Number 7   Number of Visits 8   Date for PT Re-Evaluation 02/17/16   PT Start Time [id number]   PT Stop Time 1103   PT Time Calculation (min) 48 min      Past Medical History:  Diagnosis Date  . Chest pain    stress test @ [facility] cardiology (-)  . Colonic polyp    bx adenomatous polyps, next scope 2010  . Depression   . Fibromyalgia    see rheumatology  . Glaucoma   . Hypothyroidism   . Migraine   . 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 Osteopenia   . Pleuritic chest pain    etiology unclear, has beeb eval by pulmonary and rheumatology (autoimmune dz?)    Past Surgical History:  Procedure Laterality Date  . ESOPHAGOGASTRODUODENOSCOPY (EGD) WITH PROPOFOL N/A 03/27/2013   Procedure: ESOPHAGOGASTRODUODENOSCOPY (EGD) WITH PROPOFOL;  Surgeon: Milus Banister, MD;  [facility]: [facility];  Service: Endoscopy;  Laterality: N/A;  possible dil  . 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  . plastic surgery face  2013  . right rotator cuff repair  2010  . TONSILLECTOMY      There were no vitals filed for this visit.      Subjective Assessment - 02/10/16 1021    Subjective Pt reports her fibromyalgia is still flared up.   The last ionto patch had some "biting" for an hour, then no issues.  Pain in hip feels more localized now.    Currently in Pain? Yes   Pain Score 3    Pain Orientation Left   Pain Descriptors / Indicators Aching            [facility] PT Assessment - 02/10/16 0001      Assessment   Medical Diagnosis Lt hip bursitis   Onset Date/Surgical Date 04/10/13     Strength   Right Hip  Flexion 5/5   Left Hip Flexion 4+/5   Left Hip Extension 4+/5   Right Knee Extension 5/5          [facility] Adult PT Treatment/Exercise - 02/10/16 0001      Lumbar Exercises: Supine   Bridge 10 reps  2 sets     Lumbar Exercises: Prone   Straight Leg Raise 10 reps  LLE, 2 sets      Knee/Hip Exercises: Stretches   Passive Hamstring Stretch Left;2 reps;30 seconds   Piriformis Stretch Left;3 reps;30 seconds  with RLE in hooklying, supporting LLE. then in sitting.   Other Knee/Hip Stretches ITB stretch with strap x 3 reps of 45 sec      Knee/Hip Exercises: Aerobic   Nustep L4x5'     Knee/Hip Exercises: Sidelying   Clams Lt clam - 2 sets of 8, then reverse clams - 3 sets of 10.      Modalities   Modalities Iontophoresis     Ultrasound   Ultrasound Location Lt greater trochanter   Ultrasound Parameters 100%, 1.2 w/cm2, x 8 min    Ultrasound Goals Pain     Iontophoresis   Type of Iontophoresis Dexamethasone  Location posterior to Lt greater troch   Dose 1.0cc   Time 8 hr patch 34mA                     PT Long Term Goals - 02/10/16 1258      PT LONG TERM GOAL #1   Title pt independent with advanced HEP by 02/17/16   Time 4   Period Weeks   Status On-going     PT LONG TERM GOAL #2   Title pt displays B LE MMT 5-/5 or better without c/o hip pain by 02/17/16   Time 4   Period Weeks   Status On-going     PT LONG TERM GOAL #3   Title perform easy dance moves without Lt hip pain ( 02/17/16)    Time 4   Period Weeks   Status On-going     PT LONG TERM GOAL #4   Title sleep on left side without Lt hip pain ( 02/17/16)    Time 4   Period Weeks   Status On-going  able to lie on Lt side for a few minutes     PT LONG TERM GOAL #5   Title improve FOTO =/< 44% limited, CK level ( 02/17/16)    Time 4   Period Weeks   Status On-going               Plan - 02/10/16 1130    Clinical Impression Statement Pt continues to have weakness in Lt hip.  She is  now less point tender in Lt greater trochanter than previous visits.  She tolerated 25mA ionto last session; repeated again today.    Rehab Potential Good   PT Frequency 2x / week   PT Duration 4 weeks   PT Treatment/Interventions Therapeutic exercise;Therapeutic activities;Manual techniques;Moist Heat;Traction;Taping;Patient/family education;Neuromuscular re-education;Functional mobility training;Electrical Stimulation;Cryotherapy;Dry needling;Iontophoresis 4mg /ml Dexamethasone   PT Next Visit Plan Manual therapy to Lt quad/ lateral thigh/ glutes.   Assess goals and need for additional sessions (end of POC)    Consulted and Agree with Plan of Care Patient      Patient will benefit from skilled therapeutic intervention in order to improve the following deficits and impairments:  Decreased strength, Pain, Difficulty walking  Visit Diagnosis: Pain in left hip  Weakness of both hips     Problem List Patient Active Problem List   Diagnosis Date Noted  . PCP NOTES >>>>>>>>>>>>>>>>>>>> 05/24/2015  . Depression with anxiety 05/22/2015  . Numbness and tingling of right arm 05/22/2015  . Pain in the chest   . Chest pain 05/21/2015  . Insomnia 09/01/2014  . GERD (gastroesophageal reflux disease) 03/27/2013  . Dysphagia, pharyngoesophageal phase 03/27/2013  . Annual physical exam 06/12/2011  . Sinusitis, chronic 02/02/2011  . Osteoporosis 06/08/2010  . Fibromyalgia, pain mngmt 01/29/2009  . LIPOMA 09/30/2008  . DEPRESSION 10/29/2007  . Migraine without aura 03/04/2007  . Hypothyroidism 05/15/2006  . COLONIC POLYPS, HX OF 05/15/2006   Kerin Perna, PTA 02/10/16 12:59 PM  [place], [place], [address] Phone: [phone]   Fax:  [phone]  Name: [name] MRN: [id number] Date of Birth: Aug 23, 1945

## 2016-02-15 ENCOUNTER — Ambulatory Visit (INDEPENDENT_AMBULATORY_CARE_PROVIDER_SITE_OTHER): Payer: PPO | Admitting: Physical Therapy

## 2016-02-15 DIAGNOSIS — M25552 Pain in left hip: Secondary | ICD-10-CM

## 2016-02-15 DIAGNOSIS — R29898 Other symptoms and signs involving the musculoskeletal system: Secondary | ICD-10-CM

## 2016-02-15 NOTE — Therapy (Addendum)
[place], [place], [address] Phone: [phone]   Fax:  [phone]  Physical Therapy Treatment  Patient Details  Name: [name] MRN: [id number] Date of Birth: 27-Nov-1945 Referring Provider: Gerrit Halls, PA  Encounter Date: 02/15/2016      PT End of Session - 02/15/16 0940    Visit Number 8   Number of Visits 8   Date for PT Re-Evaluation 02/17/16   PT Start Time 0936   PT Stop Time 1024   PT Time Calculation (min) 48 min      Past Medical History:  Diagnosis Date  . Chest pain    stress test @ [facility] cardiology (-)  . Colonic polyp    bx adenomatous polyps, next scope 2010  . Depression   . Fibromyalgia    see rheumatology  . Glaucoma   . Hypothyroidism   . Migraine   . 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 Osteopenia   . Pleuritic chest pain    etiology unclear, has beeb eval by pulmonary and rheumatology (autoimmune dz?)    Past Surgical History:  Procedure Laterality Date  . ESOPHAGOGASTRODUODENOSCOPY (EGD) WITH PROPOFOL N/A 03/27/2013   Procedure: ESOPHAGOGASTRODUODENOSCOPY (EGD) WITH PROPOFOL;  Surgeon: Milus Banister, MD;  Location: [facility];  Service: Endoscopy;  Laterality: N/A;  possible dil  . 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  . plastic surgery face  2013  . right rotator cuff repair  2010  . TONSILLECTOMY      There were no vitals filed for this visit.      Subjective Assessment - 02/15/16 0940    Subjective Pt did a lot of walking this weekend, Rt hip was very painful and weak by end of day.  She states she feels good when leaving therapy session, but the relief is short lived.  She reports about 40% improvement since beginning therapy session.   She has less pain with sitting, but now mostly with walking.    Diagnostic tests MRI from 07/24/15 indicates multilevel DDD and DJD with several areas of L nerve root impingement, x-ray hip arthritis    Patient Stated Goals dance on Dec 2nd and sleep on Lt side.   Currently in Pain? Yes   Pain Score 3    Pain Location Hip   Pain Orientation Left   Pain Descriptors / Indicators Aching   Aggravating Factors  walking    Pain Relieving Factors ice            [facility] PT Assessment - 02/15/16 0001      Assessment   Medical Diagnosis Lt hip bursitis   Referring Provider Gerrit Halls, PA   Onset Date/Surgical Date 04/10/13   Next MD Visit 03/09/16     Observation/Other Assessments   Focus on Therapeutic Outcomes (FOTO)  55% limited.      Strength   Left Hip Flexion 4+/5  with some discomfort   Left Hip Extension 4+/5   Left Hip ABduction 4-/5          [facility] Adult PT Treatment/Exercise - 02/15/16 0001      Lumbar Exercises: Stretches   Quad Stretch 2 reps;30 seconds  1 rep on Rt      Lumbar Exercises: Sidelying   Hip Abduction 10 reps;4 seconds  eccentric return   Other Sidelying Lumbar Exercises Lt hip clams with 3 sec hold x 10 reps      Lumbar  Exercises: Prone   Straight Leg Raise 10 reps  LLE   Other Prone Lumbar Exercises Hip ext, knee flexed x 10 reps LLE     Knee/Hip Exercises: Stretches   Passive Hamstring Stretch Left;2 reps;30 seconds   Piriformis Stretch Left;3 reps;30 seconds  with RLE in hooklying, supporting LLE. then in sitting.   Other Knee/Hip Stretches ITB stretch with strap x 3 reps of 45 sec      Knee/Hip Exercises: Aerobic   Recumbent Bike L2: 5 min      Knee/Hip Exercises: Supine   Straight Leg Raises Left;1 set;10 reps   Other Supine Knee/Hip Exercises resisted Lt hip fleixon x 3 sec x 10 reps     Modalities   Modalities Ultrasound     Ultrasound   Ultrasound Location Lt greater trochanter and piriformis   Ultrasound Parameters 100%, 1.2 w/cm2, 8 min    Ultrasound Goals Pain     Manual Therapy   Manual Therapy Taping   Manual therapy comments Star pattern of I strips of Rock tape applied over greater trochanter to decompress  tissue, increase proprioception, and decrease pain.                       PT Long Term Goals - 02/15/16 0958      PT LONG TERM GOAL #1   Title pt independent with advanced HEP by 02/17/16   Time 4   Period Weeks   Status On-going     PT LONG TERM GOAL #2   Title pt displays B LE MMT 5-/5 or better without c/o hip pain by 02/17/16   Time 4   Period Weeks   Status On-going  improving     PT LONG TERM GOAL #3   Title perform easy dance moves without Lt hip pain ( 02/17/16)    Time 4   Period Weeks   Status Deferred  (has not attempted)     PT LONG TERM GOAL #4   Title sleep on left side without Lt hip pain ( 02/17/16)    Time 4   Period Weeks   Status On-going  can only tolerate 1 min, doesn't want to agrivate hip      PT LONG TERM GOAL #5   Title improve FOTO =/< 44% limited, CK level ( 02/17/16)    Time 4   Period Weeks   Status On-going      PT renewal sent, new goal dates of 03/15/16 [name], PT 02/16/16 3:02 PM          Plan - 02/15/16 1237    Clinical Impression Statement Pt is now less tender to touch along Lt lateral hip than previous sessions.  She is able to lay on Lt side for a few minutes now. She has had mild strength gains but continues with decreased strength in Lt hip compared to RLE.  Pt interested in continuation of therapy to meet stated goals.    Rehab Potential Good   PT Frequency 2x / week   PT Duration 4 weeks   PT Treatment/Interventions Therapeutic exercise;Therapeutic activities;Manual techniques;Moist Heat;Traction;Taping;Patient/family education;Neuromuscular re-education;Functional mobility training;Electrical Stimulation;Cryotherapy;Dry needling;Iontophoresis 4mg /ml Dexamethasone   PT Next Visit Plan Spoke to supervising PT regarding pt's progress; will request renewal. Continue progressive strengthening to LLE.  Assess response to Rock tape.     Consulted and Agree with Plan of Care Patient      Patient will benefit  from skilled therapeutic intervention in order to  improve the following deficits and impairments:  Decreased strength, Pain, Difficulty walking  Visit Diagnosis: Pain in left hip  Weakness of both hips     Problem List Patient Active Problem List   Diagnosis Date Noted  . PCP NOTES >>>>>>>>>>>>>>>>>>>> 05/24/2015  . Depression with anxiety 05/22/2015  . Numbness and tingling of right arm 05/22/2015  . Pain in the chest   . Chest pain 05/21/2015  . Insomnia 09/01/2014  . GERD (gastroesophageal reflux disease) 03/27/2013  . Dysphagia, pharyngoesophageal phase 03/27/2013  . Annual physical exam 06/12/2011  . Sinusitis, chronic 02/02/2011  . Osteoporosis 06/08/2010  . Fibromyalgia, pain mngmt 01/29/2009  . LIPOMA 09/30/2008  . DEPRESSION 10/29/2007  . Migraine without aura 03/04/2007  . Hypothyroidism 05/15/2006  . COLONIC POLYPS, HX OF 05/15/2006    Shelbie Hutching 02/15/2016, 12:53 PM  [facility] [place], [place], [address] Phone: [phone]   Fax:  [phone]  Name: [name] MRN: [id number] Date of Birth: Aug 13, 1945

## 2016-02-16 NOTE — Addendum Note (Signed)
Addended by: SHAVER, SUSAN E on: 02/16/2016 03:03 PM   Modules accepted: Orders

## 2016-02-17 ENCOUNTER — Ambulatory Visit (INDEPENDENT_AMBULATORY_CARE_PROVIDER_SITE_OTHER): Payer: PPO | Admitting: Physical Therapy

## 2016-02-17 DIAGNOSIS — R29898 Other symptoms and signs involving the musculoskeletal system: Secondary | ICD-10-CM | POA: Diagnosis not present

## 2016-02-17 DIAGNOSIS — M25552 Pain in left hip: Secondary | ICD-10-CM | POA: Diagnosis not present

## 2016-02-17 NOTE — Therapy (Signed)
[employer] [place], [place], [address] Phone: [phone]   Fax:  [phone]  Physical Therapy Treatment  Patient Details  Name: [name] MRN: [id number] Date of Birth: [birth year] Referring Provider: Gerrit Halls, PA  Encounter Date: 02/17/2016      PT End of Session - 02/17/16 1155    Visit Number 9   Number of Visits 16   Date for PT Re-Evaluation 03/15/16   PT Start Time 1022   PT Stop Time 1103   PT Time Calculation (min) 41 min      Past Medical History:  Diagnosis Date  . Chest pain    stress test @ [facility] cardiology (-)  . Colonic polyp    bx adenomatous polyps, next scope 2010  . Depression   . Fibromyalgia    see rheumatology  . Glaucoma   . Hypothyroidism   . Migraine   . 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 Osteopenia   . Pleuritic chest pain    etiology unclear, has beeb eval by pulmonary and rheumatology (autoimmune dz?)    Past Surgical History:  Procedure Laterality Date  . ESOPHAGOGASTRODUODENOSCOPY (EGD) WITH PROPOFOL N/A 03/27/2013   Procedure: ESOPHAGOGASTRODUODENOSCOPY (EGD) WITH PROPOFOL;  Surgeon: Milus Banister, MD;  Location: [facility];  Service: Endoscopy;  Laterality: N/A;  possible dil  . 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  . plastic surgery face  2013  . right rotator cuff repair  2010  . TONSILLECTOMY      There were no vitals filed for this visit.      Subjective Assessment - 02/17/16 1023    Subjective Pt realized that the cross body stretch for Lt hip irritated it.  she was able to sleep on Lt side for 30 min.    Patient Stated Goals dance on Dec 2nd and sleep on Lt side.   Currently in Pain? Yes   Pain Score 4    Pain Location Hip   Pain Orientation Left   Pain Descriptors / Indicators Sharp   Aggravating Factors  walking    Pain Relieving Factors ice             [facility] PT Assessment - 02/17/16 0001      Assessment   Medical Diagnosis Lt hip bursitis   Referring Provider Gerrit Halls, PA   Next MD Visit 03/09/16          [facility] Adult PT Treatment/Exercise - 02/17/16 0001      Lumbar Exercises: Stretches   Passive Hamstring Stretch 2 reps;30 seconds  seated with straight back.    Piriformis Stretch 3 reps;30 seconds     Lumbar Exercises: Aerobic   Stationary Bike NuSTep L4: 6 min      Knee/Hip Exercises: Standing   Other Standing Knee Exercises side stepping with yellow band at ankles x 15 feet x 4 reps    Other Standing Knee Exercises Two step dance with yellow band at calf level (forward and backward x 25 ft x 4 reps.  Standing ITB stretch x 30 sec x 3 reps.  Standing hip flexor stretch in Rt lunge x 30 sec x 2 reps (demo and VC for form).       Ultrasound   Ultrasound Location Lt greater trochanter (posterior hip rotator attachments    Ultrasound Parameters 100%, 1.2 w/cm 2, 8 min    Ultrasound Goals Pain     Manual Therapy  Manual Therapy Myofascial release   Myofascial Release to Lt TFL, glute med, piriformis            PT Long Term Goals - 02/15/16 [id number]      PT LONG TERM GOAL #1   Title pt independent with advanced HEP by 02/17/16   Time 4   Period Weeks   Status On-going     PT LONG TERM GOAL #2   Title pt displays B LE MMT 5-/5 or better without c/o hip pain by 02/17/16   Time 4   Period Weeks   Status On-going  improving     PT LONG TERM GOAL #3   Title perform easy dance moves without Lt hip pain ( 02/17/16)    Time 4   Period Weeks   Status Deferred  (has not attempted)     PT LONG TERM GOAL #4   Title sleep on left side without Lt hip pain ( 02/17/16)    Time 4   Period Weeks   Status On-going  can only tolerate 1 min, doesn't want to agrivate hip      PT LONG TERM GOAL #5   Title improve FOTO =/< 44% limited, CK level ( 02/17/16)    Time 4   Period Weeks   Status On-going               Plan - 02/17/16 1341    Rehab Potential Good   PT  Frequency 2x / week   PT Duration 4 weeks   PT Treatment/Interventions Therapeutic exercise;Therapeutic activities;Manual techniques;Moist Heat;Traction;Taping;Patient/family education;Neuromuscular re-education;Functional mobility training;Electrical Stimulation;Cryotherapy;Dry needling;Iontophoresis 4mg /ml Dexamethasone   PT Next Visit Plan Continue progressive strengthening to LLE.       Patient will benefit from skilled therapeutic intervention in order to improve the following deficits and impairments:  Decreased strength, Pain, Difficulty walking  Visit Diagnosis: Pain in left hip  Weakness of both hips     Problem List Patient Active Problem List   Diagnosis Date Noted  . PCP NOTES >>>>>>>>>>>>>>>>>>>> 05/24/2015  . Depression with anxiety 05/22/2015  . Numbness and tingling of right arm 05/22/2015  . Pain in the chest   . Chest pain 05/21/2015  . Insomnia 09/01/2014  . GERD (gastroesophageal reflux disease) 03/27/2013  . Dysphagia, pharyngoesophageal phase 03/27/2013  . Annual physical exam 06/12/2011  . Sinusitis, chronic 02/02/2011  . Osteoporosis 06/08/2010  . Fibromyalgia, pain mngmt 01/29/2009  . LIPOMA 09/30/2008  . DEPRESSION 10/29/2007  . Migraine without aura 03/04/2007  . Hypothyroidism 05/15/2006  . COLONIC POLYPS, HX OF 05/15/2006   Kerin Perna, PTA 02/17/16 2:15 PM  [facility] [place], [place], [address] Phone: [phone]   Fax:  [phone]  Name: [name] MRN: [id number] Date of Birth: 1945-10-07

## 2016-02-21 ENCOUNTER — Ambulatory Visit (INDEPENDENT_AMBULATORY_CARE_PROVIDER_SITE_OTHER): Payer: PPO | Admitting: Physical Therapy

## 2016-02-21 DIAGNOSIS — R29898 Other symptoms and signs involving the musculoskeletal system: Secondary | ICD-10-CM

## 2016-02-21 DIAGNOSIS — M25552 Pain in left hip: Secondary | ICD-10-CM

## 2016-02-21 NOTE — Therapy (Signed)
[place], [place], [address] Phone: [phone]   Fax:  [phone]  Physical Therapy Treatment  Patient Details  Name: [name] MRN: [id number] Date of Birth: February 05, 1946 Referring Provider: Gerrit Halls, PA   Encounter Date: 02/21/2016      PT End of Session - 02/21/16 1206    Visit Number 10   Number of Visits 16   Date for PT Re-Evaluation 03/15/16   PT Start Time 1204   PT Stop Time 1255   PT Time Calculation (min) 51 min   Activity Tolerance Patient tolerated treatment well;No increased pain      Past Medical History:  Diagnosis Date  . Chest pain    stress test @ [facility] cardiology (-)  . Colonic polyp    bx adenomatous polyps, next scope 2010  . Depression   . Fibromyalgia    see rheumatology  . Glaucoma   . Hypothyroidism   . Migraine   . 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 Osteopenia   . Pleuritic chest pain    etiology unclear, has beeb eval by pulmonary and rheumatology (autoimmune dz?)    Past Surgical History:  Procedure Laterality Date  . ESOPHAGOGASTRODUODENOSCOPY (EGD) WITH PROPOFOL N/A 03/27/2013   Procedure: ESOPHAGOGASTRODUODENOSCOPY (EGD) WITH PROPOFOL;  Surgeon: Milus Banister, MD;  Location: [facility];  Service: Endoscopy;  Laterality: N/A;  possible dil  . 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  . plastic surgery face  2013  . right rotator cuff repair  2010  . TONSILLECTOMY      There were no vitals filed for this visit.      Subjective Assessment - 02/21/16 1206    Subjective Pt reports she is only able to sleep on Lt side before it starts to nag.  Sitting is no longer a problem, just prolonged walking.     Patient Stated Goals dance on Dec 2nd and sleep on Lt side.   Currently in Pain? Yes   Pain Score 3    Pain Location Hip   Pain Orientation Left   Pain Descriptors / Indicators Nagging   Aggravating Factors  walking   Pain  Relieving Factors ice             [facility] PT Assessment - 02/21/16 0001      Assessment   Medical Diagnosis Lt hip bursitis   Referring Provider Gerrit Halls, PA    Next MD Visit 03/09/16     Observation/Other Assessments   Focus on Therapeutic Outcomes (FOTO)  38% limited.           [facility] Adult PT Treatment/Exercise - 02/21/16 0001      Self-Care   Self-Care Other Self-Care Comments   Other Self-Care Comments  Pt instructed on self massage with roller /stick to Lt quad, hamstring, lateral thigh.  Pt verbalized understanding and returned demo.      Lumbar Exercises: Stretches   Passive Hamstring Stretch 2 reps;30 seconds  seated with straight back.    Piriformis Stretch 3 reps;30 seconds     Lumbar Exercises: Aerobic   Stationary Bike NuSTep L4: 5 min      Lumbar Exercises: Supine   Bridge 10 reps  figure 4, LLE, 2 sets   Straight Leg Raise 10 reps  2 sets   Isometric Hip Flexion 10 reps;5 seconds  LLE     Knee/Hip Exercises: Standing   Other Standing Knee Exercises  braiding 25 ft x 4 reps,  Step Dreisbach change x 25 ft  x 2.   Lt SLS with Rt toe taps to 6" step x 10, repeated on Rt.     Other Standing Knee Exercises Two step dance with red band at calf level (forward and backward x 25 ft x 4 reps.  Standing ITB stretch x 30 sec x 3 reps.  Standing hip flexor stretch in Rt lunge x 30 sec x 2 reps (demo and VC for form).       Moist Heat Therapy   Number Minutes Moist Heat 10 Minutes   Moist Heat Location Hip  Lt     Cryotherapy   Number Minutes Cryotherapy --   Cryotherapy Location --   Type of Cryotherapy --     Manual Therapy   Manual Therapy --   Myofascial Release --                     PT Long Term Goals - 02/21/16 1258      PT LONG TERM GOAL #1   Title pt independent with advanced HEP by 02/17/16   Time 4   Period Weeks   Status On-going     PT LONG TERM GOAL #2   Title pt displays B LE MMT 5-/5 or better without c/o hip pain by  02/17/16   Time 4   Period Weeks   Status On-going     PT LONG TERM GOAL #3   Title perform easy dance moves without Lt hip pain ( 02/17/16)    Time 4   Period Weeks   Status On-going     PT LONG TERM GOAL #4   Title sleep on left side without Lt hip pain ( 02/17/16)    Time 4   Period Weeks   Status On-going     PT LONG TERM GOAL #5   Title improve FOTO =/< 44% limited, CK level ( 02/17/16)    Time 4   Period Weeks   Status Achieved               Plan - 02/21/16 1256    Clinical Impression Statement Pt has responded well to new exercises in standing and supine; no increase in pain.  Pt also responded well to the MHP at end of session.  She has met her FOTO goal this date and is progressing toward other remaining goals.    Rehab Potential Good   PT Frequency 2x / week   PT Duration 4 weeks   PT Treatment/Interventions Therapeutic exercise;Therapeutic activities;Manual techniques;Moist Heat;Traction;Taping;Patient/family education;Neuromuscular re-education;Functional mobility training;Electrical Stimulation;Cryotherapy;Dry needling;Iontophoresis 71m/ml Dexamethasone   PT Next Visit Plan Continue progressive strengthening to LLE.    Consulted and Agree with Plan of Care Patient      Patient will benefit from skilled therapeutic intervention in order to improve the following deficits and impairments:  Decreased strength, Pain, Difficulty walking  Visit Diagnosis: Pain in left hip  Weakness of both hips     Problem List Patient Active Problem List   Diagnosis Date Noted  . PCP NOTES >>>>>>>>>>>>>>>>>>>> 05/24/2015  . Depression with anxiety 05/22/2015  . Numbness and tingling of right arm 05/22/2015  . Pain in the chest   . Chest pain 05/21/2015  . Insomnia 09/01/2014  . GERD (gastroesophageal reflux disease) 03/27/2013  . Dysphagia, pharyngoesophageal phase 03/27/2013  . Annual physical exam 06/12/2011  . Sinusitis, chronic 02/02/2011  . Osteoporosis  06/08/2010  . Fibromyalgia,  pain mngmt 01/29/2009  . LIPOMA 09/30/2008  . DEPRESSION 10/29/2007  . Migraine without aura 03/04/2007  . Hypothyroidism 05/15/2006  . COLONIC POLYPS, HX OF 05/15/2006   Kerin Perna, PTA 02/21/16 1:21 PM   [name], PT 02/21/16 1:21 PM   [facility] [place], [place], [address] Phone: [phone]   Fax:  [phone]  Name: JODETTE WIK MRN: [id number] Date of Birth: [birth year]

## 2016-02-22 ENCOUNTER — Telehealth: Payer: Self-pay | Admitting: Internal Medicine

## 2016-02-23 MED ORDER — HYDROCODONE-ACETAMINOPHEN 5-325 MG PO TABS: 1.0000 | ORAL_TABLET | Freq: Four times a day (QID) | ORAL | 0 refills | Status: DC | PRN

## 2016-02-23 NOTE — Telephone Encounter (Signed)
Ok 300, no RF 

## 2016-02-23 NOTE — Telephone Encounter (Signed)
Rx printed, awaiting MD signature.  

## 2016-02-23 NOTE — Telephone Encounter (Signed)
Pt informed that Rx has been placed at front desk for pick up at her convenience.  

## 2016-02-23 NOTE — Telephone Encounter (Signed)
Pt is requesting refill on Hydrocodone.  Last OV: 09/08/2015, f/u appt scheduled 03/09/2016 Last Fill: 11/29/2015 #300 and 0RF Pt sig: 1 tab q6h prn UDS: 09/02/2015 Low risk  Pt requesting medication early due to going out of town to see family for Thanksgiving holiday. Please advise.

## 2016-02-24 ENCOUNTER — Ambulatory Visit (INDEPENDENT_AMBULATORY_CARE_PROVIDER_SITE_OTHER): Payer: PPO | Admitting: Physical Therapy

## 2016-02-24 DIAGNOSIS — R29898 Other symptoms and signs involving the musculoskeletal system: Secondary | ICD-10-CM

## 2016-02-24 DIAGNOSIS — M25552 Pain in left hip: Secondary | ICD-10-CM

## 2016-02-24 NOTE — Therapy (Addendum)
[place], [place], [address] Phone: [phone]   Fax:  [phone]  Physical Therapy Treatment  Patient Details  Name: [name] MRN: [id number] Date of Birth: [birth year] Referring Provider: Gerrit Halls, PA   Encounter Date: 02/24/2016      PT End of Session - 02/24/16 1209    Visit Number 11   Number of Visits 16   Date for PT Re-Evaluation 03/15/16   PT Start Time 1148   PT Stop Time 1252   PT Time Calculation (min) 64 min      Past Medical History:  Diagnosis Date  . Chest pain    stress test @ [facility] cardiology (-)  . Colonic polyp    bx adenomatous polyps, next scope 2010  . Depression   . Fibromyalgia    see rheumatology  . Glaucoma   . Hypothyroidism   . Migraine   . 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 Osteopenia   . Pleuritic chest pain    etiology unclear, has beeb eval by pulmonary and rheumatology (autoimmune dz?)    Past Surgical History:  Procedure Laterality Date  . ESOPHAGOGASTRODUODENOSCOPY (EGD) WITH PROPOFOL N/A 03/27/2013   Procedure: ESOPHAGOGASTRODUODENOSCOPY (EGD) WITH PROPOFOL;  Surgeon: Milus Banister, MD;  [facility];  Service: Endoscopy;  Laterality: N/A;  possible dil  . 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  . plastic surgery face  2013  . right rotator cuff repair  2010  . TONSILLECTOMY      There were no vitals filed for this visit.      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  She did some sight seeing yesterday, 4/10 with prolonged walking.   Now it hurts to sit for any length of time (that had previously resolved).    Diagnostic tests MRI from 07/24/15 indicates multilevel DDD and DJD with several areas of L nerve root impingement, x-ray hip arthritis   Patient Stated Goals dance on Dec 2nd and sleep on Lt side.   Currently in Pain?  Yes   Pain Score 4    Pain Location Hip   Pain Orientation Left   Pain Descriptors / Indicators Aching   Aggravating Factors  prolonged walking or sitting   Pain Relieving Factors ice/ heat             [facility] PT Assessment - 02/24/16 0001      Assessment   Medical Diagnosis Lt hip bursitis   Referring Provider Gerrit Halls, PA    Next MD Visit 03/09/16     Strength   Right Hip Flexion 5/5   Right Hip Extension --  5-/5   Right Hip ABduction 4+/5   Left Hip Flexion --  5-/5   Left Hip Extension 4+/5   Left Hip ABduction 4+/5           [facility] Adult PT Treatment/Exercise - 02/24/16 0001      Lumbar Exercises: Stretches   Passive Hamstring Stretch 2 reps;30 seconds  seated with straight back.    ITB Stretch 2 reps  cross body stretch   Piriformis Stretch 3 reps;30 seconds     Lumbar Exercises: Aerobic   Stationary Bike NuSTep L4: 5 min      Knee/Hip Exercises: Standing   Hip Abduction Stengthening;Right;Left;1 set;10 reps;Knee straight   Hip Extension  Stengthening;Right;Left;1 set;10 reps;Knee straight   Lateral Step Up Limitations Rt heel taps off of 3" step hip dips to work Lt glute med) x 5 reps x 3 sets   Other Standing Knee Exercises braiding 25 ft x 4 reps,  Step Scarbro change x 25 ft  x 2.     Other Standing Knee Exercises Two step dance with red band at calf level (forward and backward x 25 ft x 4 reps.       Moist Heat Therapy   Number Minutes Moist Heat 12 Minutes   Moist Heat Location Hip  Lt     Manual Therapy   Soft tissue mobilization to Lt glute med/max, Hip rotators  with passive hip IR/ER                      PT Long Term Goals - 02/24/16 1246      PT LONG TERM GOAL #1   Title pt independent with advanced HEP by 02/17/16   Time 4   Period Weeks   Status On-going     PT LONG TERM GOAL #2   Title pt displays B LE MMT 5-/5 or better without c/o hip pain by 02/17/16   Time 4   Period Weeks   Status Partially Met  no pain  with muscle testing.  improved from eval.      PT LONG TERM GOAL #3   Title perform easy dance moves without Lt hip pain ( 02/17/16)    Time 4   Period Weeks   Status Achieved     PT LONG TERM GOAL #4   Title sleep on left side without Lt hip pain ( 02/17/16)    Time 4   Period Weeks   Status On-going  able to sleep 5-30 min before pain is untolerated.      PT LONG TERM GOAL #5   Title improve FOTO =/< 44% limited, CK level ( 02/17/16)    Time 4   Period Weeks   Status Achieved               Plan - 02/24/16 1248    Clinical Impression Statement Pt reported her Lt hip pain reduced with ther ex and further reduced with MHP at end of session.  Her Lt hip strength is improving.  She remains slightly point tender over Lt glute med, but not as much as previous weeks.  She has partially met her goals.     Rehab Potential Good   PT Frequency 2x / week   PT Duration 4 weeks   PT Treatment/Interventions Therapeutic exercise;Therapeutic activities;Manual techniques;Moist Heat;Traction;Taping;Patient/family education;Neuromuscular re-education;Functional mobility training;Electrical Stimulation;Cryotherapy;Dry needling;Iontophoresis 25m/ml Dexamethasone   PT Next Visit Plan Pt returning to MD; will await further instruction.     Consulted and Agree with Plan of Care Patient      Patient will benefit from skilled therapeutic intervention in order to improve the following deficits and impairments:  Decreased strength, Pain, Difficulty walking  Visit Diagnosis: Pain in left hip  Weakness of both hips     Problem List Patient Active Problem List   Diagnosis Date Noted  . PCP NOTES >>>>>>>>>>>>>>>>>>>> 05/24/2015  . Depression with anxiety 05/22/2015  . Numbness and tingling of right arm 05/22/2015  . Pain in the chest   . Chest pain 05/21/2015  . Insomnia 09/01/2014  . GERD (gastroesophageal reflux disease) 03/27/2013  . Dysphagia, pharyngoesophageal phase 03/27/2013  .  Annual physical exam 06/12/2011  .  Sinusitis, chronic 02/02/2011  . Osteoporosis 06/08/2010  . Fibromyalgia, pain mngmt 01/29/2009  . LIPOMA 09/30/2008  . DEPRESSION 10/29/2007  . Migraine without aura 03/04/2007  . Hypothyroidism 05/15/2006  . COLONIC POLYPS, HX OF 05/15/2006   Kerin Perna, PTA 02/24/16 1:24 PM  [facility] [place] [address] [place], [place], [address] Phone: [phone]   Fax:  [phone]  Name: [name] MRN: [id number] Date of Birth: 15-Oct-1945   PHYSICAL THERAPY DISCHARGE SUMMARY  Visits from Start of Care: 11  Current functional level related to goals / functional outcomes: unknown   Remaining deficits: unknown   Education / Equipment: HEP Plan:                                                    Patient goals were partially met. Patient is being discharged due to not returning since the last visit.  ?????    [name], PT 04/05/16 9:11 AM

## 2016-03-09 ENCOUNTER — Ambulatory Visit (INDEPENDENT_AMBULATORY_CARE_PROVIDER_SITE_OTHER): Payer: PPO | Admitting: Internal Medicine

## 2016-03-09 ENCOUNTER — Encounter: Payer: Self-pay | Admitting: Internal Medicine

## 2016-03-09 VITALS — BP 120/78 | HR 76 | Temp 98.0°F | Resp 14 | Ht 66.0 in | Wt 177.5 lb

## 2016-03-09 DIAGNOSIS — M545 Low back pain: Secondary | ICD-10-CM | POA: Diagnosis not present

## 2016-03-09 DIAGNOSIS — G8929 Other chronic pain: Secondary | ICD-10-CM | POA: Diagnosis not present

## 2016-03-09 DIAGNOSIS — M7062 Trochanteric bursitis, left hip: Secondary | ICD-10-CM | POA: Diagnosis not present

## 2016-03-09 DIAGNOSIS — E039 Hypothyroidism, unspecified: Secondary | ICD-10-CM

## 2016-03-09 LAB — TSH: TSH: 2.59 u[IU]/mL (ref 0.35–4.50)

## 2016-03-09 NOTE — Patient Instructions (Signed)
GO TO THE LAB : Get the blood work     GO TO THE FRONT DESK Schedule your next appointment for a  physical exam in 6 months  

## 2016-03-09 NOTE — Progress Notes (Signed)
Subjective:    Patient ID: [name], female    DOB: 1945-09-01, 70 y.o.   MRN: [id number]  DOS:  03/09/2016 Type of visit - description : rov Interval history: In general feeling well. Self discontinue aspirin due to easy bruising. Taking all the medications as usual. Xanax she takes regularly. Back pain: Improved after physical therapy, still require hydrocodone 4 per day Had hip pain, had a local injection for bursitis and it helped. Left ear discomfort, like "water is in there". No URI type of symptoms, no fever or chills   Review of Systems   Past Medical History:  Diagnosis Date  . Chest pain    stress test @ [facility] cardiology (-)  . Colonic polyp    bx adenomatous polyps, next scope 2010  . Depression   . Fibromyalgia    see rheumatology  . Glaucoma   . Hypothyroidism   . Migraine   . 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 Osteopenia   . Pleuritic chest pain    etiology unclear, has beeb eval by pulmonary and rheumatology (autoimmune dz?)    Past Surgical History:  Procedure Laterality Date  . ESOPHAGOGASTRODUODENOSCOPY (EGD) WITH PROPOFOL N/A 03/27/2013   Procedure: ESOPHAGOGASTRODUODENOSCOPY (EGD) WITH PROPOFOL;  Surgeon: Milus Banister, MD;  [facility];  Service: Endoscopy;  Laterality: N/A;  possible dil  . 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  . plastic surgery face  2013  . right rotator cuff repair  2010  . TONSILLECTOMY      Social History   Social History  . Marital status: Married    Spouse name: N/A  . Number of children: 0  . Years of education: N/A   Occupational History  . retired, Medical sales representative work Retired   Social History Main Topics  . Smoking status: Former Smoker    Packs/day: 1.00    Years: 38.50    Types: Cigarettes    Quit date: 01/13/2004  . Smokeless tobacco: Never Used     Comment: quit 2005  . Alcohol use 0.0 oz/week     Comment: rare  . Drug use: No  . Sexual activity: Not Currently   Other  Topics Concern  . Not on file   Social History Narrative   No biological children              Medication List       Accurate as of 03/09/16 11:59 PM. Always use your most recent med list.          ALPRAZolam 0.5 MG tablet Commonly known as:  XANAX Take 1 tablet (0.5 mg total) by mouth at bedtime as needed for anxiety.   calcium carbonate 1250 (500 Ca) MG chewable tablet Commonly known as:  OS-CAL Chew 1 tablet by mouth daily.   conjugated estrogens vaginal cream Commonly known as:  PREMARIN Place 1 Applicatorful vaginally 2 (two) times a week.   diphenhydramine-acetaminophen 25-500 MG Tabs tablet Commonly known as:  TYLENOL PM Take 1 tablet by mouth at bedtime as needed.   HYDROcodone-acetaminophen 5-325 MG tablet Commonly known as:  NORCO/VICODIN Take 1 tablet by mouth every 6 (six) hours as needed for severe pain.   levothyroxine 88 MCG tablet Commonly known as:  SYNTHROID, LEVOTHROID Take 1 tablet (88 mcg total) by mouth daily before breakfast.   multivitamin capsule Take 1 capsule by mouth daily.   polycarbophil 625 MG tablet Commonly known as:  FIBERCON Take 625 mg  by mouth daily.   Vitamin D3 1000 units Caps Take 1,200 Units by mouth.          Objective:   Physical Exam BP 120/78 (BP Location: Left Arm, Patient Position: Sitting, Cuff Size: Normal)   Pulse 76   Temp 98 F (36.7 C) (Oral)   Resp 14   Ht 5\' 6"  (1.676 m)   Wt 177 lb 8 oz (80.5 kg)   BMI 28.65 kg/m  General:   Well developed, well nourished . NAD.  HEENT:  Normocephalic . Face symmetric, atraumatic. Left ear: Normal. Right ear --some wax. Lungs:  CTA B Normal respiratory effort, no intercostal retractions, no accessory muscle use. Heart: RRR,  no murmur.  No pretibial edema bilaterally  Skin: Not pale. Not jaundice Neurologic:  alert & oriented X3.  Speech normal, gait appropriate for age and unassisted Psych--  Cognition and judgment appear intact.  Cooperative  with normal attention span and concentration.  Behavior appropriate. No anxious or depressed appearing.      Assessment & Plan:   Assessment Hypothyroidism GERD Migraines insmonia-- xanax prn MSK- Pain mngmt -- back pain, 2017 had a MRI, saw Dr Durward Fortes, Rx local injection, PT Osteopenia:  --Used ot see rheumatology --T score 2011   -2.6 (intolerant to fosamax, ok w/ Boniva x years) --T score 01-2013   -2.6 --T score 02-2015  -2.3, rx ca and cit D  Glaucoma Ocular Rosacea 2010 Lung cancer screening CT 09-2014, negative, repeat 1 year H/o Fibromyalgia CP admitted, stress lest low risk 05-2015 H/o Pleuritic chest pain, on-off, resolved    PLAN: Hypothyroidism: Good med compliance, check a TSH MSK, Pain management: Back pain improved after physical therapy, had hip pain, improve after local injection for bursitis. Unable to take less than hydrocodone 5 mg QID Left ear discomfort: Exam negative, recommend observation Self DC aspirin due to easy bruising RTC 6 months, CPX.

## 2016-03-09 NOTE — Progress Notes (Signed)
Pre visit review using our clinic review tool, if applicable. No additional management support is needed unless otherwise documented below in the visit note. 

## 2016-03-11 NOTE — Assessment & Plan Note (Signed)
Hypothyroidism: Good med compliance, check a TSH MSK, Pain management: Back pain improved after physical therapy, had hip pain, improve after local injection for bursitis. Unable to take less than hydrocodone 5 mg QID Left ear discomfort: Exam negative, recommend observation Self DC aspirin due to easy bruising RTC 6 months, CPX.

## 2016-03-15 ENCOUNTER — Other Ambulatory Visit (HOSPITAL_BASED_OUTPATIENT_CLINIC_OR_DEPARTMENT_OTHER): Payer: Self-pay | Admitting: Orthopedic Surgery

## 2016-03-15 DIAGNOSIS — M7062 Trochanteric bursitis, left hip: Secondary | ICD-10-CM

## 2016-03-18 ENCOUNTER — Ambulatory Visit (HOSPITAL_BASED_OUTPATIENT_CLINIC_OR_DEPARTMENT_OTHER): Admission: RE | Admit: 2016-03-18 | Payer: PPO | Source: Ambulatory Visit

## 2016-03-25 ENCOUNTER — Ambulatory Visit (HOSPITAL_BASED_OUTPATIENT_CLINIC_OR_DEPARTMENT_OTHER)
Admission: RE | Admit: 2016-03-25 | Discharge: 2016-03-25 | Disposition: A | Discharge status: Home / Self Care | Payer: PPO | Source: Ambulatory Visit | Attending: Orthopedic Surgery | Admitting: Orthopedic Surgery

## 2016-03-25 DIAGNOSIS — M7062 Trochanteric bursitis, left hip: Secondary | ICD-10-CM

## 2016-03-25 DIAGNOSIS — M7602 Gluteal tendinitis, left hip: Secondary | ICD-10-CM | POA: Diagnosis not present

## 2016-03-28 DIAGNOSIS — H2513 Age-related nuclear cataract, bilateral: Secondary | ICD-10-CM | POA: Diagnosis not present

## 2016-03-28 DIAGNOSIS — H04123 Dry eye syndrome of bilateral lacrimal glands: Secondary | ICD-10-CM | POA: Diagnosis not present

## 2016-03-28 DIAGNOSIS — H43393 Other vitreous opacities, bilateral: Secondary | ICD-10-CM | POA: Diagnosis not present

## 2016-04-10 DIAGNOSIS — S62109A Fracture of unspecified carpal bone, unspecified wrist, initial encounter for closed fracture: Secondary | ICD-10-CM

## 2016-04-10 HISTORY — DX: Fracture of unspecified carpal bone, unspecified wrist, initial encounter for closed fracture: S62.109A

## 2016-04-13 DIAGNOSIS — M7062 Trochanteric bursitis, left hip: Secondary | ICD-10-CM | POA: Diagnosis not present

## 2016-05-05 ENCOUNTER — Encounter: Payer: Self-pay | Admitting: Medical

## 2016-05-05 ENCOUNTER — Ambulatory Visit (INDEPENDENT_AMBULATORY_CARE_PROVIDER_SITE_OTHER): Payer: PPO | Admitting: Medical

## 2016-05-05 VITALS — BP 101/60 | HR 95 | Temp 99.3°F | Resp 16 | Ht 66.0 in | Wt 176.0 lb

## 2016-05-05 DIAGNOSIS — R6889 Other general symptoms and signs: Secondary | ICD-10-CM

## 2016-05-05 DIAGNOSIS — J111 Influenza due to unidentified influenza virus with other respiratory manifestations: Secondary | ICD-10-CM

## 2016-05-05 DIAGNOSIS — H6690 Otitis media, unspecified, unspecified ear: Secondary | ICD-10-CM | POA: Diagnosis not present

## 2016-05-05 LAB — POC INFLUENZA A&B (BINAX/QUICKVUE)
INFLUENZA A, POC: NEGATIVE
Influenza B, POC: NEGATIVE

## 2016-05-05 LAB — POCT RAPID STREP A (OFFICE): Rapid Strep A Screen: NEGATIVE

## 2016-05-05 MED ORDER — BENZONATATE 100 MG PO CAPS: 100.0000 mg | ORAL_CAPSULE | Freq: Three times a day (TID) | ORAL | 0 refills | Status: DC | PRN

## 2016-05-05 MED ORDER — AZITHROMYCIN 250 MG PO TABS: ORAL_TABLET | ORAL | 0 refills | Status: DC

## 2016-05-05 MED ORDER — OSELTAMIVIR PHOSPHATE 75 MG PO CAPS: 75.0000 mg | ORAL_CAPSULE | Freq: Two times a day (BID) | ORAL | 0 refills | Status: DC

## 2016-05-05 NOTE — Progress Notes (Signed)
Pre visit review using our clinic review tool, if applicable. No additional management support is needed unless otherwise documented below in the visit note/SLS  

## 2016-05-05 NOTE — Patient Instructions (Addendum)
For flu syndrome despite negative flu will rx tamiflu. Likely false negative result.(tylenol or ibuprofen for fever)  For OM secondary infection rx azithromycin.  For cough benzonatate. For nasal congestion flonase.  Very important to hydrate well with propel and eat bland foods. BP lower than normal and you report less intake recently.  Want you to avoid need for iv hydrate in ED. If with likely flu signs and symptoms worsen then ED evaluation.  Follow up in 7 days or as  needed

## 2016-05-05 NOTE — Progress Notes (Signed)
Subjective:    Patient ID: [name], female    DOB: [birth year], 71 y.o.   MRN: [id number]  HPI  Pt in with head and chest congestion, ear pressure, non productive cough, chills, fever, body aches, and fatigue.  Symptoms came on wed evening.   Pt has some ear pain and some sinus pressure. But ear worse. Both sides  Pt thermometer states 101-102. But our temp was less today at 99.3  Pt had flu vaccine this year.   Review of Systems  Constitutional: Positive for chills, fatigue and fever.  HENT: Positive for congestion and ear pain. Negative for sinus pain, sinus pressure and sore throat.   Respiratory: Positive for cough. Negative for chest tightness, shortness of breath and wheezing.   Cardiovascular: Negative for chest pain and palpitations.  Gastrointestinal: Negative for abdominal distention, abdominal pain, blood in stool, constipation, diarrhea and nausea.  Musculoskeletal: Positive for myalgias. Negative for back pain.  Skin: Negative for rash.  Neurological: Negative for dizziness.  Hematological: Negative for adenopathy. Does not bruise/bleed easily.  Psychiatric/Behavioral: Negative for behavioral problems and confusion.    Past Medical History:  Diagnosis Date  . Chest pain    stress test @ [facility] cardiology (-)  . Colonic polyp    bx adenomatous polyps, next scope 2010  . Depression   . Fibromyalgia    see rheumatology  . Glaucoma   . Hypothyroidism   . Migraine   . 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 Osteopenia   . Pleuritic chest pain    etiology unclear, has beeb eval by pulmonary and rheumatology (autoimmune dz?)     Social History   Social History  . Marital status: Married    Spouse name: N/A  . Number of children: 0  . Years of education: N/A   Occupational History  . retired, Medical sales representative work Retired   Social History Main Topics  . Smoking status: Former Smoker    Packs/day: 1.00    Years: 38.50    Types: Cigarettes    Quit  date: 01/13/2004  . Smokeless tobacco: Never Used     Comment: quit 2005  . Alcohol use 0.0 oz/week     Comment: rare  . Drug use: No  . Sexual activity: Not Currently   Other Topics Concern  . Not on file   Social History Narrative   No biological children          Past Surgical History:  Procedure Laterality Date  . ESOPHAGOGASTRODUODENOSCOPY (EGD) WITH PROPOFOL N/A 03/27/2013   Procedure: ESOPHAGOGASTRODUODENOSCOPY (EGD) WITH PROPOFOL;  Surgeon: Milus Banister, MD;  Location: [facility];  Service: Endoscopy;  Laterality: N/A;  possible dil  . 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  . plastic surgery face  2013  . right rotator cuff repair  2010  . TONSILLECTOMY      Family History  Problem Relation Age of Onset  . Adopted: Yes  . Colon cancer Neg Hx   . Cancer Neg Hx   . Depression Neg Hx   . Diabetes Neg Hx   . Stroke Neg Hx   . Hypertension Neg Hx   . Heart disease Neg Hx     Allergies  Allergen Reactions  . Alendronate Sodium     REACTION: urticaria (hives)  . Bactrim [Sulfamethoxazole-Trimethoprim]     Upset stomach  . Contrast Media [Iodinated Diagnostic Agents] Hives  . Milnacipran     REACTION: increased bp,  increased hr  . Penicillins     REACTION: urticaria (hives)    Current Outpatient Prescriptions on File Prior to Visit  Medication Sig Dispense Refill  . ALPRAZolam (XANAX) 0.5 MG tablet Take 1 tablet (0.5 mg total) by mouth at bedtime as needed for anxiety. 30 tablet 2  . calcium carbonate (OS-CAL) 1250 (500 CA) MG chewable tablet Chew 1 tablet by mouth daily.    . Cholecalciferol (VITAMIN D3) 1000 UNITS CAPS Take 1,200 Units by mouth.    . conjugated estrogens (PREMARIN) vaginal cream Place 1 Applicatorful vaginally 2 (two) times a week. 42.5 g 12  . diphenhydramine-acetaminophen (TYLENOL PM) 25-500 MG TABS tablet Take 1 tablet by mouth at bedtime as needed.    Marland Kitchen HYDROcodone-acetaminophen (NORCO/VICODIN) 5-325 MG tablet Take 1 tablet by  mouth every 6 (six) hours as needed for severe pain. 300 tablet 0  . levothyroxine (SYNTHROID, LEVOTHROID) 88 MCG tablet Take 1 tablet (88 mcg total) by mouth daily before breakfast. 90 tablet 2  . Multiple Vitamin (MULTIVITAMIN) capsule Take 1 capsule by mouth daily.      . polycarbophil (FIBERCON) 625 MG tablet Take 625 mg by mouth daily.       No current facility-administered medications on file prior to visit.     BP (!) 86/58 (BP Location: Right Arm, Patient Position: Sitting, Cuff Size: Large)   Pulse 95   Temp 99.3 F (37.4 C) (Oral)   Resp 16   Ht 5\' 6"  (1.676 m)   Wt 176 lb (79.8 kg)   SpO2 97%   BMI 28.41 kg/m       Objective:   Physical Exam  General  Mental Status - Alert. General Appearance - Well groomed. Not in acute distress.  Skin Rashes- No Rashes. Mild dry feel.  HEENT Head- Normal. Ear Auditory Canal - Left- Normal. Right - Normal.Tympanic Membrane- Left- mild red tm Right- mild red tm. Eye Sclera/Conjunctiva- Left- Normal. Right- Normal. Nose & Sinuses Nasal Mucosa- Left-  Boggy and Congested. Right-  Boggy and  Congested.Bilateral no  maxillary and no  frontal sinus pressure. Mouth & Throat Lips: Upper Lip- Normal: no dryness, cracking, pallor, cyanosis, or vesicular eruption. Lower Lip-Normal: no dryness, cracking, pallor, cyanosis or vesicular eruption. Buccal Mucosa- Bilateral- No Aphthous ulcers. Oropharynx- No Discharge or Erythema. Tonsils: Characteristics- Bilateral- No Erythema or Congestion. Size/Enlargement- Bilateral- No enlargement. Discharge- bilateral-None.  Neck Neck- Supple. No Masses.   Chest and Lung Exam Auscultation: Breath Sounds:-Clear even and unlabored.  Cardiovascular Auscultation:Rythm- Regular, rate and rhythm. Murmurs & Other Heart Sounds:Ausculatation of the heart reveal- No Murmurs.  Lymphatic Head & Neck General Head & Neck Lymphatics: Bilateral: Description- No Localized lymphadenopathy.   Abdomen-  soft, nt, nd, +bs,no rebound or guarding.   Neuro- CN III- XII intact.       Assessment & Plan:  For flu syndrome despite negative flu will  rx tamiflu. Likely false negative result.  For OM secondary infection rx azithromycin.  For cough benzonatate. For nasal congestion flonase.  Very important to hydrate well with propel and eat bland foods. BP lower than normal and you report less intake recently.  Want you to avoid need for iv hydrate in ED. If with likely flu signs and symptoms worsen then ED evaluation.  Follow up in 7 days or as  Needed  Reyonna Haack, Percell Miller, PA-C

## 2016-05-24 ENCOUNTER — Other Ambulatory Visit: Payer: Self-pay | Admitting: Internal Medicine

## 2016-05-24 NOTE — Telephone Encounter (Signed)
Pt is requesting refill on Hydrocodone.  Last OV: 03/09/2016 Last Fill: 02/23/2016 #300 and 0RF UDS: 09/02/2015 Low risk  Please advise.

## 2016-05-25 MED ORDER — HYDROCODONE-ACETAMINOPHEN 5-325 MG PO TABS: 1.0000 | ORAL_TABLET | Freq: Four times a day (QID) | ORAL | 0 refills | Status: DC | PRN

## 2016-05-25 NOTE — Telephone Encounter (Signed)
Okay 300, no refill

## 2016-05-25 NOTE — Telephone Encounter (Signed)
Rx printed, awaiting MD signature.  

## 2016-05-25 NOTE — Telephone Encounter (Signed)
Pt informed via MyChart that Rx has been placed at front desk for pick up.  

## 2016-05-26 DIAGNOSIS — M7062 Trochanteric bursitis, left hip: Secondary | ICD-10-CM | POA: Diagnosis not present

## 2016-08-14 ENCOUNTER — Other Ambulatory Visit: Payer: Self-pay | Admitting: Internal Medicine

## 2016-08-20 ENCOUNTER — Other Ambulatory Visit: Payer: Self-pay | Admitting: Internal Medicine

## 2016-08-21 MED ORDER — HYDROCODONE-ACETAMINOPHEN 5-325 MG PO TABS: 1.0000 | ORAL_TABLET | Freq: Four times a day (QID) | ORAL | 0 refills | Status: DC | PRN

## 2016-08-21 NOTE — Telephone Encounter (Signed)
Pt informed via MyChart that Rx has been placed at front desk for pick up at her convenience.  

## 2016-08-21 NOTE — Telephone Encounter (Signed)
Pt is requesting refill on Hydrocodone.  Last OV: 03/09/2016 Last Fill: 05/25/2016 #300 and 0RF  UDS: 09/02/2015 Low risk  Due for UDS at time of Rx pick up  Please advise.

## 2016-08-21 NOTE — Telephone Encounter (Signed)
Okay #300, no refills

## 2016-08-22 ENCOUNTER — Encounter: Payer: Self-pay | Admitting: Internal Medicine

## 2016-08-22 DIAGNOSIS — Z79891 Long term (current) use of opiate analgesic: Secondary | ICD-10-CM | POA: Diagnosis not present

## 2016-08-29 ENCOUNTER — Telehealth: Payer: Self-pay | Admitting: Internal Medicine

## 2016-08-29 NOTE — Telephone Encounter (Signed)
Spoke with Mrs. [name] regarding AWV. Patient stated that she will like to wait until next year to schedule awv for her husband [name]) and herself. Informed patient to give office a call when she is ready to schedule appt.

## 2016-08-30 ENCOUNTER — Telehealth: Payer: Self-pay

## 2016-08-30 NOTE — Telephone Encounter (Signed)
UDS: 08/22/2016  Positive for Hydrocodone Positive for Hydromorphone (metabolite of Hydrocodone)  Low risk per PCP 08/30/2016

## 2016-09-13 ENCOUNTER — Ambulatory Visit (INDEPENDENT_AMBULATORY_CARE_PROVIDER_SITE_OTHER): Payer: PPO | Admitting: Internal Medicine

## 2016-09-13 ENCOUNTER — Encounter: Payer: Self-pay | Admitting: Internal Medicine

## 2016-09-13 VITALS — BP 118/78 | HR 71 | Temp 98.1°F | Resp 14 | Wt 177.2 lb

## 2016-09-13 DIAGNOSIS — Z0000 Encounter for general adult medical examination without abnormal findings: Secondary | ICD-10-CM

## 2016-09-13 LAB — LIPID PANEL
Cholesterol: 173 mg/dL (ref 0–200)
HDL: 47.6 mg/dL (ref >39.00)
LDL Cholesterol: 99 mg/dL (ref 0–99)
NonHDL: 124.91
TRIGLYCERIDES: 131 mg/dL (ref 0.0–149.0)
Total CHOL/HDL Ratio: 4
VLDL: 26.2 mg/dL (ref 0.0–40.0)

## 2016-09-13 LAB — CBC WITH DIFFERENTIAL/PLATELET
BASOS PCT: 0.9 % (ref 0.0–3.0)
Basophils Absolute: 0.1 10*3/uL (ref 0.0–0.1)
EOS PCT: 2.5 % (ref 0.0–5.0)
Eosinophils Absolute: 0.2 10*3/uL (ref 0.0–0.7)
HCT: 41 % (ref 36.0–46.0)
Hemoglobin: 13.8 g/dL (ref 12.0–15.0)
LYMPHS ABS: 2 10*3/uL (ref 0.7–4.0)
Lymphocytes Relative: 27.9 % (ref 12.0–46.0)
MCHC: 33.7 g/dL (ref 30.0–36.0)
MCV: 91.4 fl (ref 78.0–100.0)
MONOS PCT: 12.8 % — AB (ref 3.0–12.0)
Monocytes Absolute: 0.9 10*3/uL (ref 0.1–1.0)
NEUTROS ABS: 4.1 10*3/uL (ref 1.4–7.7)
NEUTROS PCT: 55.9 % (ref 43.0–77.0)
PLATELETS: 275 10*3/uL (ref 150.0–400.0)
RBC: 4.49 Mil/uL (ref 3.87–5.11)
RDW: 13.2 % (ref 11.5–15.5)
WBC: 7.3 10*3/uL (ref 4.0–10.5)

## 2016-09-13 LAB — COMPREHENSIVE METABOLIC PANEL
ALK PHOS: 68 U/L (ref 39–117)
ALT: 13 U/L (ref 0–35)
AST: 14 U/L (ref 0–37)
Albumin: 4.2 g/dL (ref 3.5–5.2)
BILIRUBIN TOTAL: 0.5 mg/dL (ref 0.2–1.2)
BUN: 15 mg/dL (ref 6–23)
CALCIUM: 9.5 mg/dL (ref 8.4–10.5)
CO2: 28 mEq/L (ref 19–32)
Chloride: 107 mEq/L (ref 96–112)
Creatinine, Ser: 0.88 mg/dL (ref 0.40–1.20)
GFR: 67.41 mL/min (ref >60.00)
Glucose, Bld: 78 mg/dL (ref 70–99)
POTASSIUM: 3.9 meq/L (ref 3.5–5.1)
Sodium: 140 mEq/L (ref 135–145)
TOTAL PROTEIN: 7 g/dL (ref 6.0–8.3)

## 2016-09-13 LAB — TSH: TSH: 0.81 u[IU]/mL (ref 0.35–4.50)

## 2016-09-13 MED ORDER — ALPRAZOLAM 0.5 MG PO TABS: 0.5000 mg | ORAL_TABLET | Freq: Every evening | ORAL | 5 refills | Status: DC | PRN

## 2016-09-13 NOTE — Progress Notes (Signed)
Pre visit review using our clinic review tool, if applicable. No additional management support is needed unless otherwise documented below in the visit note. 

## 2016-09-13 NOTE — Assessment & Plan Note (Signed)
Hypothyroidism: On Synthroid, checking labs GERD, migraines: Not an issue at this point Insomnia: Refill Xanax, she takes as needed, sometimes takes Tylenol PM Pain management: Refill hydrocodone when needed. Osteopenia: Good compliance with calcium and vitamin D. Lung cancer screening: Has a follow-up CT schedule ready RTC 6 months

## 2016-09-13 NOTE — Progress Notes (Signed)
Subjective:    Patient ID: [name], female    DOB: February 12, 1946, 71 y.o.   MRN: [id number]  DOS:  09/13/2016 Type of visit - description : CPX Interval history: No major concerns, Refill on Xanax.  Review of Systems  A 14 point review of systems is negative    Past Medical History:  Diagnosis Date  . Chest pain    stress test @ [facility] cardiology (-)  . Colonic polyp    bx adenomatous polyps, next scope 2010  . Fibromyalgia    see rheumatology  . Glaucoma   . Hypothyroidism   . Migraine   . 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 Osteopenia   . Pleuritic chest pain    etiology unclear, has beeb eval by pulmonary and rheumatology (autoimmune dz?)    Past Surgical History:  Procedure Laterality Date  . ESOPHAGOGASTRODUODENOSCOPY (EGD) WITH PROPOFOL N/A 03/27/2013   Procedure: ESOPHAGOGASTRODUODENOSCOPY (EGD) WITH PROPOFOL;  Surgeon: Milus Banister, MD;  Location: [facility];  Service: Endoscopy;  Laterality: N/A;  possible dil  . 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  . plastic surgery face  2013  . right rotator cuff repair  2010  . TONSILLECTOMY     Family History  Problem Relation Age of Onset  . Adopted: Yes  . Colon cancer Neg Hx   . Cancer Neg Hx   . Depression Neg Hx   . Diabetes Neg Hx   . Stroke Neg Hx   . Hypertension Neg Hx   . Heart disease Neg Hx      Social History   Social History  . Marital status: Married    Spouse name: N/A  . Number of children: 0  . Years of education: N/A   Occupational History  . retired, Medical sales representative work Retired   Social History Main Topics  . Smoking status: Former Smoker    Packs/day: 1.00    Years: 38.50    Types: Cigarettes    Quit date: 01/13/2004  . Smokeless tobacco: Never Used     Comment: quit 2005  . Alcohol use 0.0 oz/week     Comment: rare  . Drug use: No  . Sexual activity: Not Currently   Other Topics Concern  . Not on file   Social History Narrative   No biological children              Allergies as of 09/13/2016      Reactions   Alendronate Sodium    REACTION: urticaria (hives)   Bactrim [sulfamethoxazole-trimethoprim]    Upset stomach   Contrast Media [iodinated Diagnostic Agents] Hives   Milnacipran    REACTION: increased bp, increased hr   Penicillins    REACTION: urticaria (hives)      Medication List       Accurate as of 09/13/16  6:32 PM. Always use your most recent med list.          ALPRAZolam 0.5 MG tablet Commonly known as:  XANAX Take 1 tablet (0.5 mg total) by mouth at bedtime as needed for anxiety.   calcium carbonate 1250 (500 Ca) MG chewable tablet Commonly known as:  OS-CAL Chew 1 tablet by mouth daily.   conjugated estrogens vaginal cream Commonly known as:  PREMARIN Place 1 Applicatorful vaginally 2 (two) times a week.   diphenhydramine-acetaminophen 25-500 MG Tabs tablet Commonly known as:  TYLENOL PM Take 1 tablet by mouth at bedtime as needed.  HYDROcodone-acetaminophen 5-325 MG tablet Commonly known as:  NORCO/VICODIN Take 1 tablet by mouth every 6 (six) hours as needed for severe pain.   levothyroxine 88 MCG tablet Commonly known as:  SYNTHROID, LEVOTHROID Take 1 tablet (88 mcg total) by mouth daily before breakfast.   multivitamin capsule Take 1 capsule by mouth daily.   polycarbophil 625 MG tablet Commonly known as:  FIBERCON Take 625 mg by mouth daily.   Vitamin D3 1000 units Caps Take 1,200 Units by mouth.          Objective:   Physical Exam BP 118/78 (BP Location: Left Arm, Patient Position: Sitting, Cuff Size: Normal)   Pulse 71   Temp 98.1 F (36.7 C) (Oral)   Resp 14   Wt 177 lb 4 oz (80.4 kg)   SpO2 98%   BMI 28.61 kg/m   General:   Well developed, well nourished . NAD.  Neck: No  thyromegaly  HEENT:  Normocephalic . Face symmetric, atraumatic Lungs:  CTA B Normal respiratory effort, no intercostal retractions, no accessory muscle use. Heart: RRR,  no murmur.  No pretibial edema  bilaterally  Abdomen:  Not distended, soft, non-tender. No rebound or rigidity.   Skin: Exposed areas without rash. Not pale. Not jaundice Neurologic:  alert & oriented X3.  Speech normal, gait appropriate for age and unassisted Strength symmetric and appropriate for age.  Psych: Cognition and judgment appear intact.  Cooperative with normal attention span and concentration.  Behavior appropriate. No anxious or depressed appearing.    Assessment & Plan:   Assessment Hypothyroidism GERD Migraines insmonia-- xanax prn MSK- Pain mngmt -- back pain, 2017 had a MRI, saw Dr Durward Fortes, Rx local injection, PT Osteopenia:  --Used ot see rheumatology --T score 2011   -2.6 (intolerant to fosamax, ok w/ Boniva x years) --T score 01-2013   -2.6 --T score 02-2015  -2.3, rx ca and cit D  Glaucoma Ocular Rosacea 2010 Lung cancer screening CT 09-2015 H/o Fibromyalgia CP admitted, stress lest low risk 05-2015 H/o Pleuritic chest pain, on-off, resolved   PLAN: Hypothyroidism: On Synthroid, checking labs GERD, migraines: Not an issue at this point Insomnia: Refill Xanax, she takes as needed, sometimes takes Tylenol PM Pain management: Refill hydrocodone when needed. Osteopenia: Good compliance with calcium and vitamin D. Lung cancer screening: Has a follow-up CT schedule ready RTC 6 months

## 2016-09-13 NOTE — Assessment & Plan Note (Addendum)
--  TD 08-2015; Pneumonia shot 2006 and 2013;  prevnar 2015; s/p zostavax ; shingrex on back order    --Cscope 11-2005:  polyps-adenomatous, repeated  colonoscopy 11/2008 and 04-2012, next in 5 years. --- Female care : likes to see a new Gyn, see AVS. (-) MMG 01-2016  --Palpable aorta-- US neg for AAA 07-2012 --diet and exercise discussed  --Labs: CMP, FLP, CBC, TSH

## 2016-09-13 NOTE — Patient Instructions (Addendum)
GO TO THE LAB : Get the blood work     GO TO THE FRONT DESK Schedule your next appointment for a  checkup in 6 months    Please schedule a visit with a new gynecologist [facility] gynecology Dr Dellis Filbert,  [address], [place], [place] [address]  Phone: [phone]

## 2016-09-18 ENCOUNTER — Ambulatory Visit (HOSPITAL_BASED_OUTPATIENT_CLINIC_OR_DEPARTMENT_OTHER)
Admission: RE | Admit: 2016-09-18 | Discharge: 2016-09-18 | Disposition: A | Discharge status: Home / Self Care | Payer: PPO | Source: Ambulatory Visit | Attending: Acute Care | Admitting: Acute Care

## 2016-09-18 DIAGNOSIS — Z122 Encounter for screening for malignant neoplasm of respiratory organs: Secondary | ICD-10-CM | POA: Insufficient documentation

## 2016-09-18 DIAGNOSIS — Z87891 Personal history of nicotine dependence: Secondary | ICD-10-CM | POA: Diagnosis not present

## 2016-09-18 DIAGNOSIS — I700 Atherosclerosis of aorta: Secondary | ICD-10-CM | POA: Insufficient documentation

## 2016-09-21 ENCOUNTER — Other Ambulatory Visit: Payer: Self-pay | Admitting: Acute Care

## 2016-09-21 DIAGNOSIS — Z87891 Personal history of nicotine dependence: Secondary | ICD-10-CM

## 2016-11-18 ENCOUNTER — Other Ambulatory Visit: Payer: Self-pay | Admitting: Internal Medicine

## 2016-11-20 MED ORDER — HYDROCODONE-ACETAMINOPHEN 5-325 MG PO TABS: 1.0000 | ORAL_TABLET | Freq: Four times a day (QID) | ORAL | 0 refills | Status: DC | PRN

## 2016-11-20 NOTE — Telephone Encounter (Signed)
Ok 300, no RF

## 2016-11-20 NOTE — Telephone Encounter (Signed)
Pt informed via MyChart that Rx has been placed at front desk for pick up at her convenience.  

## 2016-11-20 NOTE — Telephone Encounter (Signed)
Rx printed, awaiting MD signature.  

## 2016-11-20 NOTE — Telephone Encounter (Signed)
Pt is requesting refill on Hydrocodone.  Last OV: 09/13/2016 Last Fill: 08/21/2016 #300 and 0RF UDS: 08/22/2016 Low risk  [place] database printed; no issues noted.   Please advise.

## 2016-12-26 ENCOUNTER — Other Ambulatory Visit: Payer: Self-pay | Admitting: Internal Medicine

## 2016-12-26 DIAGNOSIS — Z1231 Encounter for screening mammogram for malignant neoplasm of breast: Secondary | ICD-10-CM

## 2016-12-28 ENCOUNTER — Emergency Department (HOSPITAL_BASED_OUTPATIENT_CLINIC_OR_DEPARTMENT_OTHER): Payer: PPO

## 2016-12-28 ENCOUNTER — Emergency Department (HOSPITAL_BASED_OUTPATIENT_CLINIC_OR_DEPARTMENT_OTHER)
Admission: EM | Admit: 2016-12-28 | Discharge: 2016-12-29 | Disposition: A | Discharge status: Home / Self Care | Payer: PPO | Attending: Emergency Medicine | Admitting: Emergency Medicine

## 2016-12-28 ENCOUNTER — Encounter (HOSPITAL_BASED_OUTPATIENT_CLINIC_OR_DEPARTMENT_OTHER): Payer: Self-pay | Admitting: Emergency Medicine

## 2016-12-28 ENCOUNTER — Telehealth: Payer: Self-pay | Admitting: Internal Medicine

## 2016-12-28 DIAGNOSIS — S63502A Unspecified sprain of left wrist, initial encounter: Secondary | ICD-10-CM | POA: Diagnosis not present

## 2016-12-28 DIAGNOSIS — Z01419 Encounter for gynecological examination (general) (routine) without abnormal findings: Secondary | ICD-10-CM

## 2016-12-28 DIAGNOSIS — Z79899 Other long term (current) drug therapy: Secondary | ICD-10-CM | POA: Diagnosis not present

## 2016-12-28 DIAGNOSIS — Z87891 Personal history of nicotine dependence: Secondary | ICD-10-CM | POA: Insufficient documentation

## 2016-12-28 DIAGNOSIS — Y998 Other external cause status: Secondary | ICD-10-CM | POA: Insufficient documentation

## 2016-12-28 DIAGNOSIS — E039 Hypothyroidism, unspecified: Secondary | ICD-10-CM | POA: Diagnosis not present

## 2016-12-28 DIAGNOSIS — Y929 Unspecified place or not applicable: Secondary | ICD-10-CM | POA: Diagnosis not present

## 2016-12-28 DIAGNOSIS — M7989 Other specified soft tissue disorders: Secondary | ICD-10-CM | POA: Diagnosis not present

## 2016-12-28 DIAGNOSIS — W010XXA Fall on same level from slipping, tripping and stumbling without subsequent striking against object, initial encounter: Secondary | ICD-10-CM | POA: Diagnosis not present

## 2016-12-28 DIAGNOSIS — Y9301 Activity, walking, marching and hiking: Secondary | ICD-10-CM | POA: Insufficient documentation

## 2016-12-28 DIAGNOSIS — M25532 Pain in left wrist: Secondary | ICD-10-CM | POA: Diagnosis not present

## 2016-12-28 DIAGNOSIS — S6992XA Unspecified injury of left wrist, hand and finger(s), initial encounter: Secondary | ICD-10-CM | POA: Diagnosis not present

## 2016-12-28 NOTE — ED Triage Notes (Signed)
Patient reports that she was walking the dog and fell onto her left wrist. The patient took 1 Vicodin prior to arrival. The patient has noted swelling to her left wrist and arm

## 2016-12-28 NOTE — ED Provider Notes (Signed)
[facility] Provider Note   CSN: [id number] Arrival date & time: 12/28/16  1951     History   Chief Complaint Chief Complaint  Patient presents with  . Wrist Pain    HPI [name] is a 71 y.o. female.  The history is provided by the patient.  She tripped over her dog while walking the dog. She fell on an outstretched left hand. She is complaining of pain in the left wrist. She has applied ice and taken hydrocodone-acetaminophen, and states she is currently not having much pain at all. She denies other injury. She specifically denies head or neck injury. She is not on any anticoagulants.  Past Medical History:  Diagnosis Date  . Chest pain    stress test @ [facility] cardiology (-)  . Colonic polyp    bx adenomatous polyps, next scope 2010  . Fibromyalgia    see rheumatology  . Glaucoma   . Hypothyroidism   . Migraine   . 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 Osteopenia   . Pleuritic chest pain    etiology unclear, has beeb eval by pulmonary and rheumatology (autoimmune dz?)    Patient Active Problem List   Diagnosis Date Noted  . PCP NOTES >>>>>>>>>>>>>>>>>>>> 05/24/2015  . Numbness and tingling of right arm 05/22/2015  . Pain in the chest   . Chest pain 05/21/2015  . Insomnia 09/01/2014  . GERD (gastroesophageal reflux disease) 03/27/2013  . Dysphagia, pharyngoesophageal phase 03/27/2013  . Annual physical exam 06/12/2011  . Sinusitis, chronic 02/02/2011  . Osteoporosis 06/08/2010  . Fibromyalgia, pain mngmt 01/29/2009  . LIPOMA 09/30/2008  . Migraine without aura 03/04/2007  . Hypothyroidism 05/15/2006  . COLONIC POLYPS, HX OF 05/15/2006    Past Surgical History:  Procedure Laterality Date  . ESOPHAGOGASTRODUODENOSCOPY (EGD) WITH PROPOFOL N/A 03/27/2013   Procedure: ESOPHAGOGASTRODUODENOSCOPY (EGD) WITH PROPOFOL;  Surgeon: Milus Banister, MD;  Location: [facility];  Service: Endoscopy;  Laterality: N/A;  possible dil  . 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  . plastic surgery face  2013  . right rotator cuff repair  2010  . TONSILLECTOMY      OB History    Gravida Para Term Preterm AB Living   0 0 0 0 0 0   SAB TAB Ectopic Multiple Live Births   0 0 0 0         Home Medications    Prior to Admission medications   Medication Sig Start Date End Date Taking? Authorizing Provider  ALPRAZolam Duanne Moron) 0.5 MG tablet Take 1 tablet (0.5 mg total) by mouth at bedtime as needed for anxiety. 09/13/16   Colon Branch, MD  calcium carbonate (OS-CAL) 1250 (500 CA) MG chewable tablet Chew 1 tablet by mouth daily.    [provider]  Cholecalciferol (VITAMIN D3) 1000 UNITS CAPS Take 1,200 Units by mouth.    [provider]  conjugated estrogens (PREMARIN) vaginal cream Place 1 Applicatorful vaginally 2 (two) times a week. 01/07/15   Lavonia Drafts, MD  diphenhydramine-acetaminophen (TYLENOL PM) 25-500 MG TABS tablet Take 1 tablet by mouth at bedtime as needed.    [provider]  HYDROcodone-acetaminophen (NORCO/VICODIN) 5-325 MG tablet Take 1 tablet by mouth every 6 (six) hours as needed for severe pain. 11/20/16   Colon Branch, MD  levothyroxine (SYNTHROID, LEVOTHROID) 88 MCG tablet Take 1 tablet (88 mcg total) by mouth daily before breakfast. 11/20/16   Colon Branch,  MD  Multiple Vitamin (MULTIVITAMIN) capsule Take 1 capsule by mouth daily.      [provider]  polycarbophil (FIBERCON) 625 MG tablet Take 625 mg by mouth daily.      [provider]    Family History Family History  Problem Relation Age of Onset  . Adopted: Yes  . Colon cancer Neg Hx   . Cancer Neg Hx   . Depression Neg Hx   . Diabetes Neg Hx   . Stroke Neg Hx   . Hypertension Neg Hx   . Heart disease Neg Hx     Social History Social History  Substance Use Topics  . Smoking status: Former Smoker    Packs/day: 1.00    Years: 38.50    Types: Cigarettes    Quit date: 01/13/2004  . Smokeless tobacco: Never  Used     Comment: quit 2005  . Alcohol use 0.0 oz/week     Comment: rare     Allergies   Alendronate sodium; Bactrim [sulfamethoxazole-trimethoprim]; Contrast media [iodinated diagnostic agents]; Milnacipran; and Penicillins   Review of Systems Review of Systems  All other systems reviewed and are negative.    Physical Exam Updated Vital Signs BP (!) 155/83 (BP Location: Left Arm)   Pulse 75   Temp 98 F (36.7 C) (Oral)   Resp 16   Ht 5\' 5"  (1.651 m)   Wt 78 kg (172 lb)   SpO2 100%   BMI 28.62 kg/m   Physical Exam  Nursing note and vitals reviewed.  71 year old female, resting comfortably and in no acute distress. Vital signs are significant for hypertension. Oxygen saturation is 100%, which is normal. Head is normocephalic and atraumatic. PERRLA, EOMI. Oropharynx is clear. Neck is nontender and supple without adenopathy or JVD. Back is nontender and there is no CVA tenderness. Lungs are clear without rales, wheezes, or rhonchi. Chest is nontender. Heart has regular rate and rhythm without murmur. Abdomen is soft, flat, nontender without masses or hepatosplenomegaly and peristalsis is normoactive. Extremities: There is soft tissue swelling over the dorsal aspect of the left wrist on the radial side, but not in the anatomic snuffbox. There is no snuffbox tenderness and no pain on axial compression of the thumb. There is mild tenderness rather diffusely in other areas through the wrist. There is no tenderness over the distal radius or ulna. Skin is warm and dry without rash. Neurologic: Mental status is normal, cranial nerves are intact, there are no motor or sensory deficits.  ED Treatments / Results   Radiology Dg Wrist Complete Left  Result Date: 12/28/2016 CLINICAL DATA:  LT wrist pain and swelling entirely radiating up LUE s/p fall x today walking her dog. Pt has pain entirely. No old injury known. EXAM: LEFT WRIST - COMPLETE 3+ VIEW COMPARISON:  None. FINDINGS:  Slight irregularity along the posterolateral aspect of the distal left radius, of uncertain age, more suggestive of old fracture than acute fracture. Neutral alignment at the radiocarpal joint space. Carpal bones appear intact and normally aligned throughout. Proximal metacarpal bones appear intact and normally aligned. Adjacent soft tissues are unremarkable. IMPRESSION: 1. Slight irregularity along the posterolateral aspect of the distal left radius, of uncertain age, more suggestive of old nondisplaced fracture than acute fracture. 2. Carpal bones appear intact and normally aligned throughout. Electronically Signed   By: Franki Cabot M.D.   On: 12/28/2016 20:43    Procedures Procedures (including critical care time)  Medications Ordered in ED Medications -  No data to display   Initial Impression / Assessment and Plan / ED Course  I have reviewed the triage vital signs and the nursing notes.  Pertinent imaging results that were available during my care of the patient were reviewed by me and considered in my medical decision making (see chart for details).  Fall with left wrist injury. X-rays show questionable old fracture the distal radius. There is no point tenderness over this area. There is no x-ray abnormality in the area where she has swelling and tenderness. She is placed in a Velcro wrist splint and is referred to hand surgery for follow-up. She has Vicodin at home and is advised that she could take of her pain, but suggested using ibuprofen or acetaminophen for less severe pain. She is to keep the splint on until she sees the Copy. Advised on ice and elevation.  Final Clinical Impressions(s) / ED Diagnoses   Final diagnoses:  Fall from slip, trip, or stumble, initial encounter  Sprain of left wrist, initial encounter    New Prescriptions New Prescriptions   No medications on file     Delora Fuel, MD 63/81/77 0010

## 2016-12-28 NOTE — Telephone Encounter (Signed)
Referral placed.

## 2016-12-28 NOTE — Telephone Encounter (Signed)
°  Relation to DK:SMMO  Call back number:[phone]   Reason for call:  Patient requesting a referral to Dr. Everardo All. Michelle Ramming, MD GYN seeking to establish care with gynecology

## 2016-12-29 NOTE — Discharge Instructions (Signed)
Wear the splint until you are seen by the hand specialist. Take ibuprofen, acetaminophen, or your Vicodin as needed for pain. Apply ice 3-4 times a day. Keep the left hand elevated as much as possible.  Please be aware that sometimes, what looks like a sprain today actually is a fracture. Dr. Fredna Dow may decide to get additional x-rays when you see him.

## 2017-01-02 DIAGNOSIS — S52591A Other fractures of lower end of right radius, initial encounter for closed fracture: Secondary | ICD-10-CM | POA: Diagnosis not present

## 2017-01-02 DIAGNOSIS — M25531 Pain in right wrist: Secondary | ICD-10-CM | POA: Diagnosis not present

## 2017-01-03 ENCOUNTER — Other Ambulatory Visit (HOSPITAL_COMMUNITY)
Admission: RE | Admit: 2017-01-03 | Discharge: 2017-01-03 | Disposition: A | Discharge status: Home / Self Care | Payer: PPO | Source: Ambulatory Visit | Attending: Obstetrics and Gynecology | Admitting: Obstetrics and Gynecology

## 2017-01-03 ENCOUNTER — Encounter: Payer: Self-pay | Admitting: Obstetrics and Gynecology

## 2017-01-03 ENCOUNTER — Ambulatory Visit (INDEPENDENT_AMBULATORY_CARE_PROVIDER_SITE_OTHER): Payer: PPO | Admitting: Obstetrics and Gynecology

## 2017-01-03 VITALS — BP 102/70 | HR 72 | Resp 16 | Ht 64.75 in | Wt 178.0 lb

## 2017-01-03 DIAGNOSIS — Z780 Asymptomatic menopausal state: Secondary | ICD-10-CM | POA: Diagnosis not present

## 2017-01-03 DIAGNOSIS — Z01419 Encounter for gynecological examination (general) (routine) without abnormal findings: Secondary | ICD-10-CM

## 2017-01-03 DIAGNOSIS — R32 Unspecified urinary incontinence: Secondary | ICD-10-CM

## 2017-01-03 DIAGNOSIS — M8580 Other specified disorders of bone density and structure, unspecified site: Secondary | ICD-10-CM | POA: Diagnosis not present

## 2017-01-03 LAB — POCT URINALYSIS DIPSTICK
BILIRUBIN UA: NEGATIVE
Glucose, UA: NEGATIVE
KETONES UA: NEGATIVE
Leukocytes, UA: NEGATIVE
NITRITE UA: NEGATIVE
PH UA: 5 (ref 5.0–8.0)
Protein, UA: NEGATIVE
RBC UA: NEGATIVE
Urobilinogen, UA: 0.2 E.U./dL

## 2017-01-03 MED ORDER — ESTROGENS, CONJUGATED 0.625 MG/GM VA CREA: TOPICAL_CREAM | VAGINAL | 0 refills | Status: DC

## 2017-01-03 NOTE — Patient Instructions (Signed)

## 2017-01-03 NOTE — Progress Notes (Signed)
71 y.o. G64P0000 Married Caucasian female here for annual exam.    Fracture of the distal radius yesterday from a fall.  Fractured her other wrist as well.   Hx of Fosamax use for about 8 years.  Off for about 10 years.   Had surgery of the vestibule in 2003 -  2004 for painful intercourse.   This did not work.  Using Premarin vaginal cream.  Not sexually active.  Can have leakage of urine with a sneeze.  Urinary hesitancy.  Strains to void.   Urine dip -   PCP: Kathlene November, Md    Patient's last menstrual period was 04/10/1998 (approximate).           Sexually active: No.  The current method of family planning is post menopausal status.    Exercising: Yes.    walking Smoker:  Former, quit 2005  Health Maintenance: Pap:  01-08-12 Neg, 01-05-11 Neg History of abnormal Pap:  no MMG: 01-06-16 Density A/Neg/BiRads1:[facility]   Colonoscopy: 04-26-12 adenomatous polyps with Dr.Jacobs. Patient is scheduled 03/2017. BMD:  02-10-15  Result : Osteopenia of hip:[facility] TDaP:  PCP Gardasil:   no HIV:Neg Hep C:Neg Screening Labs:  Hb today: PCP, Urine today: not done   reports that she quit smoking about 12 years ago. Her smoking use included Cigarettes. She has a 38.50 pack-year smoking history. She has never used smokeless tobacco. She reports that she does not drink alcohol or use drugs.  Past Medical History:  Diagnosis Date  . Chest pain    stress test @ [facility] cardiology (-)  . Colonic polyp    bx adenomatous polyps, next scope 2010  . Fibromyalgia    see rheumatology  . Glaucoma   . Hypothyroidism   . Migraine   . 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 Osteopenia   . Pleuritic chest pain    etiology unclear, has beeb eval by pulmonary and rheumatology (autoimmune dz?)    Past Surgical History:  Procedure Laterality Date  . ESOPHAGOGASTRODUODENOSCOPY (EGD) WITH PROPOFOL N/A 03/27/2013   Procedure: ESOPHAGOGASTRODUODENOSCOPY (EGD) WITH PROPOFOL;   Surgeon: Milus Banister, MD;  Location: [facility];  Service: Endoscopy;  Laterality: N/A;  possible dil  . 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  . plastic surgery face  2013  . right rotator cuff repair  2010  . TONSILLECTOMY      Current Outpatient Prescriptions  Medication Sig Dispense Refill  . ALPRAZolam (XANAX) 0.5 MG tablet Take 1 tablet (0.5 mg total) by mouth at bedtime as needed for anxiety. 30 tablet 5  . calcium carbonate (OS-CAL) 1250 (500 CA) MG chewable tablet Chew 1 tablet by mouth daily.    . Cholecalciferol (VITAMIN D3) 1000 UNITS CAPS Take 1,200 Units by mouth.    . conjugated estrogens (PREMARIN) vaginal cream Place 1 Applicatorful vaginally 2 (two) times a week. 42.5 g 12  . diphenhydramine-acetaminophen (TYLENOL PM) 25-500 MG TABS tablet Take 1 tablet by mouth at bedtime as needed.    Marland Kitchen HYDROcodone-acetaminophen (NORCO/VICODIN) 5-325 MG tablet Take 1 tablet by mouth every 6 (six) hours as needed for severe pain. 300 tablet 0  . levothyroxine (SYNTHROID, LEVOTHROID) 88 MCG tablet Take 1 tablet (88 mcg total) by mouth daily before breakfast. 90 tablet 1  . Multiple Vitamin (MULTIVITAMIN) capsule Take 1 capsule by mouth daily.      . polycarbophil (FIBERCON) 625 MG tablet Take 625 mg by mouth  daily.       No current facility-administered medications for this visit.     Family History  Problem Relation Age of Onset  . Adopted: Yes  . Colon cancer Neg Hx   . Cancer Neg Hx   . Depression Neg Hx   . Diabetes Neg Hx   . Stroke Neg Hx   . Hypertension Neg Hx   . Heart disease Neg Hx     ROS:  Pertinent items are noted in HPI.  Otherwise, a comprehensive ROS was negative.  Exam:   BP 102/70 (BP Location: Right Arm, Patient Position: Sitting, Cuff Size: Normal)   Pulse 72   Resp 16   Ht 5' 4.75" (1.645 m)   Wt 178 lb (80.7 kg)   LMP 04/10/1998 (Approximate)   BMI 29.85 kg/m     General appearance: alert, cooperative and appears stated age Head:  Normocephalic, without obvious abnormality, atraumatic Neck: no adenopathy, supple, symmetrical, trachea midline and thyroid normal to inspection and palpation Lungs: clear to auscultation bilaterally Breasts: normal appearance, no masses or tenderness, No nipple retraction or dimpling, No nipple discharge or bleeding, No axillary or supraclavicular adenopathy Heart: regular rate and rhythm Abdomen: soft, non-tender; no masses, no organomegaly Extremities: extremities normal, atraumatic, no cyanosis or edema Skin: Skin color, texture, turgor normal. No rashes or lesions Lymph nodes: Cervical, supraclavicular, and axillary nodes normal. No abnormal inguinal nodes palpated Neurologic: Grossly normal  Pelvic: External genitalia:  no lesions              Urethra:  normal appearing urethra with no masses, tenderness or lesions              Bartholins and Skenes: normal                 Vagina: normal appearing vagina with normal color and discharge, no lesions              Cervix: no lesions              Pap taken: Yes.   Bimanual Exam:  Uterus:  normal size, contour, position, consistency, mobility, non-tender              Adnexa: no mass, fullness, tenderness              Rectal exam: Yes.  .  Confirms.              Anus:  normal sphincter tone, no lesions  Chaperone was present for exam.  Assessment:   Well woman visit with normal exam. Osteopenia.  Urinary incontinence, unspecified.  Menopause.  Hx recent fracture.  Hx vestibular surgery for dyspareunia.  Atrophy of the vagina.  Using Premarin vaginal cream.   Plan: Mammogram screening discussed. Recommended self breast awareness. Pap and HR HPV as above. Guidelines for Calcium, Vitamin D, regular exercise program including cardiovascular and weight bearing exercise. BMD ordered.  Urine dip - negative.  Discussed Kegel's. Refill Premarin vaginal cream.  Discussed potential increased risk of breast cancer.  Follow up annually  and prn.   After visit summary provided.

## 2017-01-03 NOTE — Addendum Note (Signed)
Addended by: Lowella Fairy on: 01/03/2017 03:00 PM   Modules accepted: Orders

## 2017-01-05 LAB — CYTOLOGY - PAP: DIAGNOSIS: NEGATIVE

## 2017-01-10 ENCOUNTER — Ambulatory Visit (INDEPENDENT_AMBULATORY_CARE_PROVIDER_SITE_OTHER): Payer: PPO

## 2017-01-10 DIAGNOSIS — Z23 Encounter for immunization: Secondary | ICD-10-CM | POA: Diagnosis not present

## 2017-01-11 DIAGNOSIS — S52592D Other fractures of lower end of left radius, subsequent encounter for closed fracture with routine healing: Secondary | ICD-10-CM | POA: Diagnosis not present

## 2017-01-15 ENCOUNTER — Ambulatory Visit (HOSPITAL_BASED_OUTPATIENT_CLINIC_OR_DEPARTMENT_OTHER): Payer: PPO

## 2017-01-19 ENCOUNTER — Ambulatory Visit (INDEPENDENT_AMBULATORY_CARE_PROVIDER_SITE_OTHER): Payer: PPO | Admitting: Orthopaedic Surgery

## 2017-01-24 DIAGNOSIS — S52592D Other fractures of lower end of left radius, subsequent encounter for closed fracture with routine healing: Secondary | ICD-10-CM | POA: Diagnosis not present

## 2017-02-02 DIAGNOSIS — S52592D Other fractures of lower end of left radius, subsequent encounter for closed fracture with routine healing: Secondary | ICD-10-CM | POA: Diagnosis not present

## 2017-02-12 IMAGING — CT CT PARANASAL SINUSES LIMITED
1 series · 10 of 12 positions shown, 13 images · non-contrast
Comparison: Head CT dated 10/29/2007.

CLINICAL DATA: Recurrence sinusitis for the past 2 months,
currently affecting the right ear.

EXAM:
CT PARANASAL SINUS LIMITED WITHOUT CONTRAST
TECHNIQUE: Non-contiguous multidetector CT images of the paranasal sinuses were
obtained in a single plane without contrast.

[Series 3: sinus prone 5.0 h31s · axial · 0.30mm/px · z∈[+23,+118]mm · 10 of 12 slices shown, 13 images]
[im 2/12  brain]
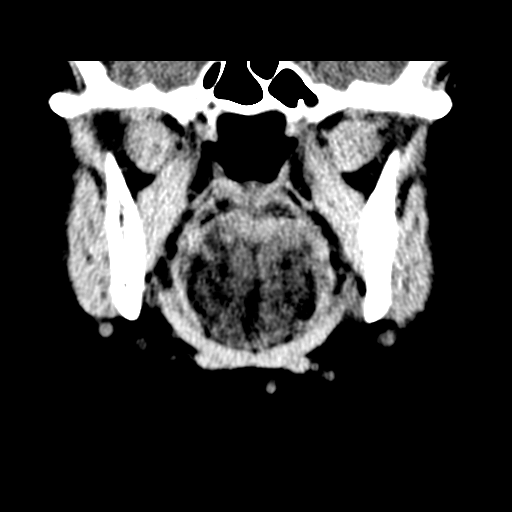
[im 2/12  bone]
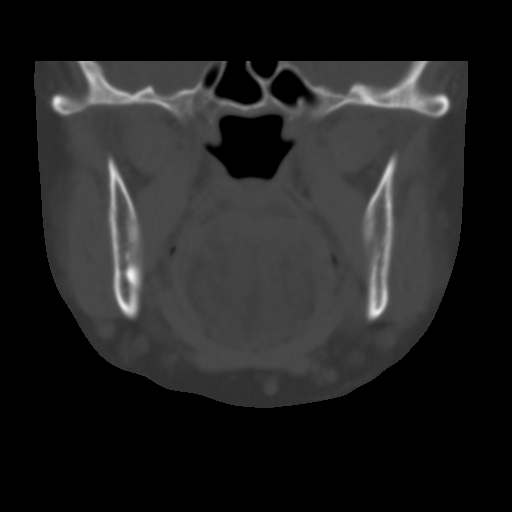
[im 3/12  bone]
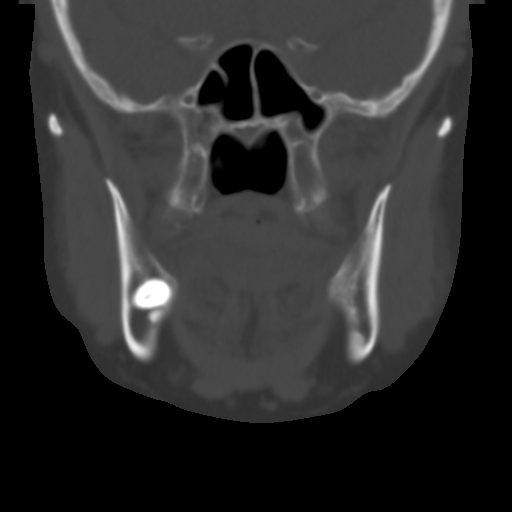
[im 4/12  bone]
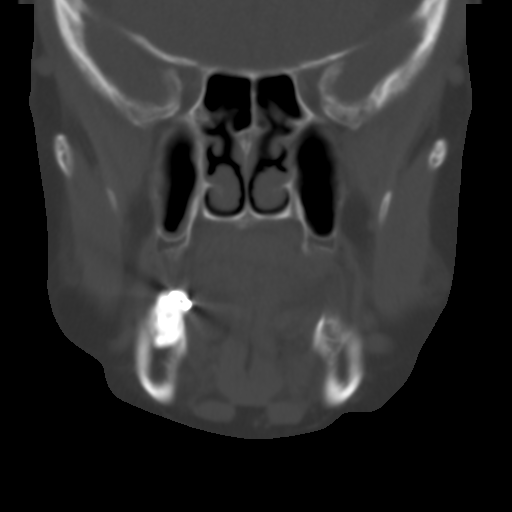
[im 5/12  bone]
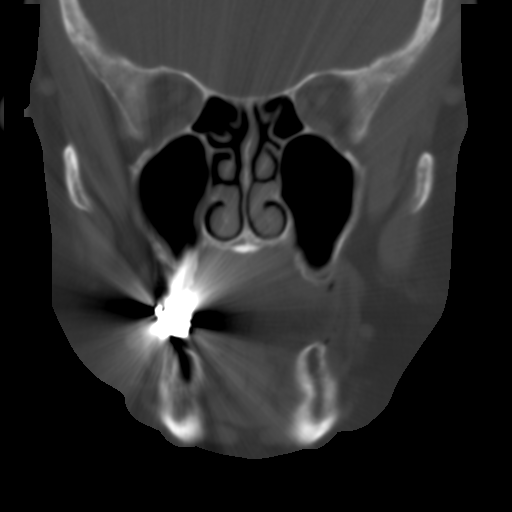
[im 6/12  brain]
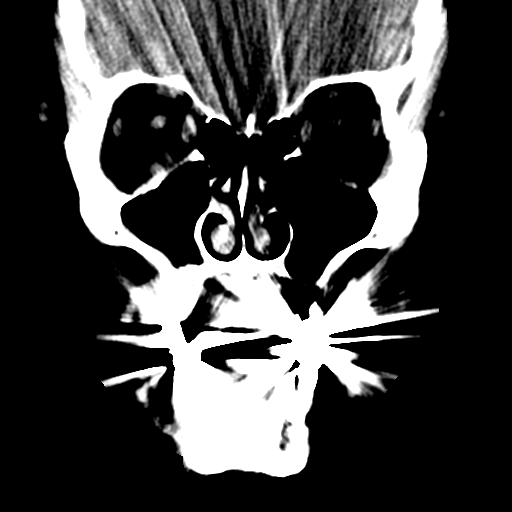
[im 6/12  bone]
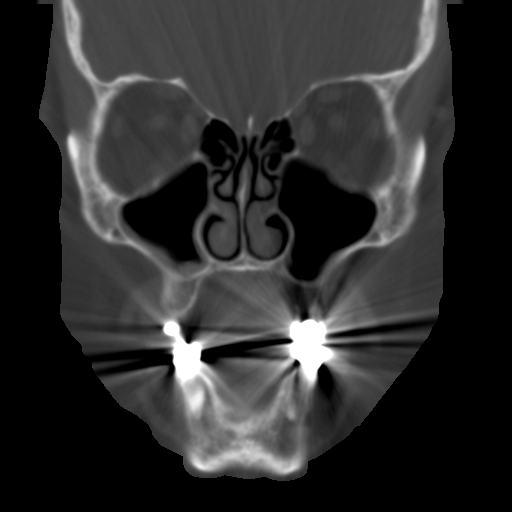
[im 7/12  bone]
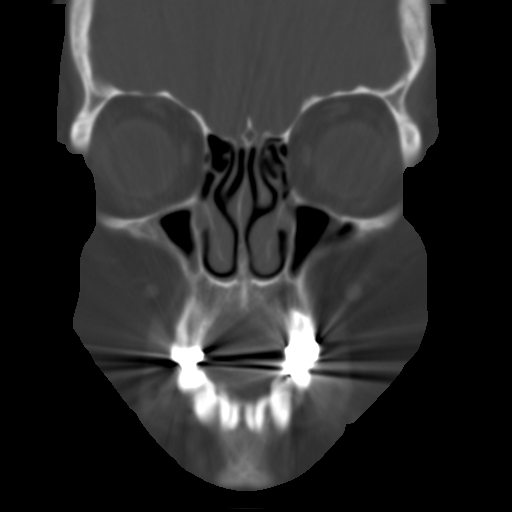
[im 8/12  bone]
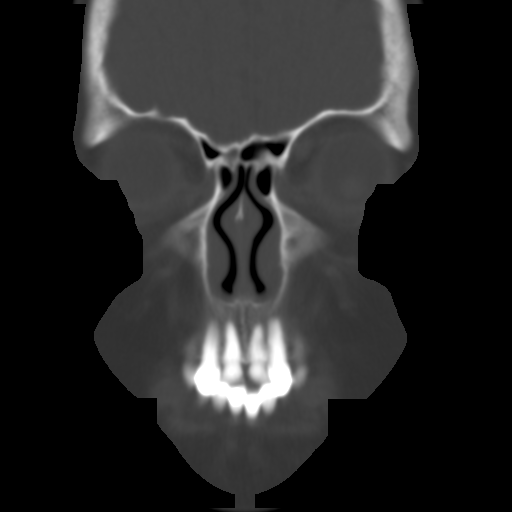
[im 9/12  bone]
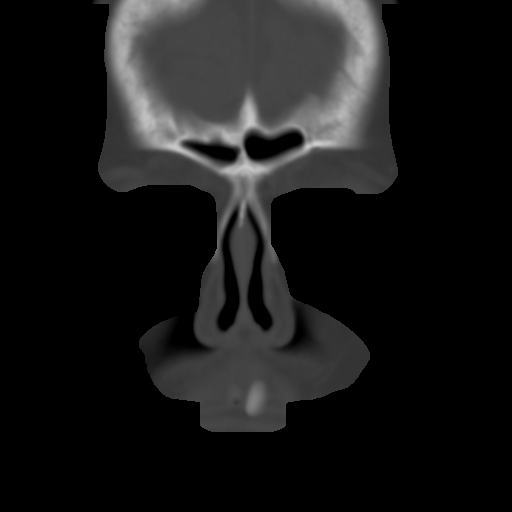
[im 10/12  brain]
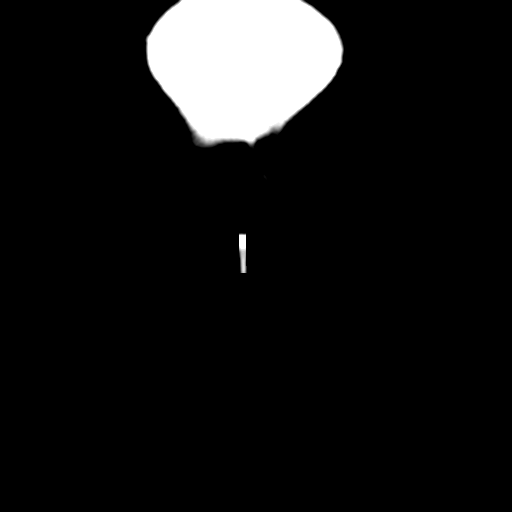
[im 10/12  bone]
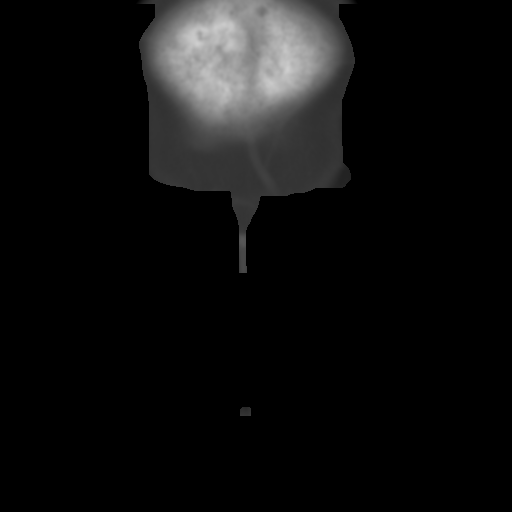
[im 11/12  bone]
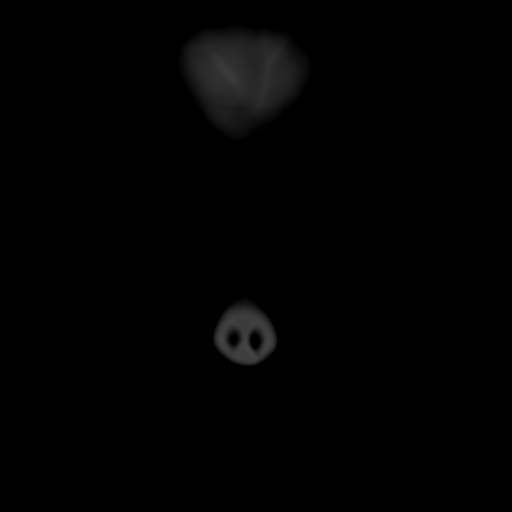

[10 of 12 positions shown; findings below may reference images not displayed]

FINDINGS: Mild right maxillary sinus mucosal thickening with a maximum
thickness of 2.6 mm. Minimal left maxillary sinus mucosal thickening
with a maximum thickness of 2.5 mm. No paranasal sinus air-fluid
levels.
IMPRESSION: Mild right and minimal left chronic maxillary sinusitis.

## 2017-02-14 DIAGNOSIS — M25532 Pain in left wrist: Secondary | ICD-10-CM | POA: Diagnosis not present

## 2017-02-14 DIAGNOSIS — S52592D Other fractures of lower end of left radius, subsequent encounter for closed fracture with routine healing: Secondary | ICD-10-CM | POA: Diagnosis not present

## 2017-02-15 ENCOUNTER — Ambulatory Visit (HOSPITAL_BASED_OUTPATIENT_CLINIC_OR_DEPARTMENT_OTHER)
Admission: RE | Admit: 2017-02-15 | Discharge: 2017-02-15 | Disposition: A | Discharge status: Home / Self Care | Payer: PPO | Source: Ambulatory Visit | Attending: Obstetrics and Gynecology | Admitting: Obstetrics and Gynecology

## 2017-02-15 ENCOUNTER — Ambulatory Visit (HOSPITAL_BASED_OUTPATIENT_CLINIC_OR_DEPARTMENT_OTHER)
Admission: RE | Admit: 2017-02-15 | Discharge: 2017-02-15 | Disposition: A | Discharge status: Home / Self Care | Payer: PPO | Source: Ambulatory Visit | Attending: Internal Medicine | Admitting: Internal Medicine

## 2017-02-15 ENCOUNTER — Other Ambulatory Visit: Payer: Self-pay | Admitting: Internal Medicine

## 2017-02-15 DIAGNOSIS — Z780 Asymptomatic menopausal state: Secondary | ICD-10-CM

## 2017-02-15 DIAGNOSIS — M8588 Other specified disorders of bone density and structure, other site: Secondary | ICD-10-CM | POA: Diagnosis not present

## 2017-02-15 DIAGNOSIS — Z1231 Encounter for screening mammogram for malignant neoplasm of breast: Secondary | ICD-10-CM | POA: Insufficient documentation

## 2017-02-15 DIAGNOSIS — M8589 Other specified disorders of bone density and structure, multiple sites: Secondary | ICD-10-CM | POA: Diagnosis not present

## 2017-02-15 DIAGNOSIS — M85851 Other specified disorders of bone density and structure, right thigh: Secondary | ICD-10-CM | POA: Insufficient documentation

## 2017-02-15 DIAGNOSIS — M8580 Other specified disorders of bone density and structure, unspecified site: Secondary | ICD-10-CM | POA: Diagnosis present

## 2017-02-16 MED ORDER — HYDROCODONE-ACETAMINOPHEN 5-325 MG PO TABS: 1.0000 | ORAL_TABLET | Freq: Four times a day (QID) | ORAL | 0 refills | Status: DC | PRN

## 2017-02-16 NOTE — Telephone Encounter (Signed)
Pt is requesting refill on hydrocodone-acetaminophen 5-325mg .   Last OV: 09/13/2016  Last Fill: 11/20/2016 #300 and 0RF (90 day supply) UDS: 08/22/2016 Low risk  NCCR printed; no issues found  Please advise.

## 2017-02-16 NOTE — Telephone Encounter (Signed)
Pt informed via MyChart that Rx has been placed at the front desk for pick up her convenience.

## 2017-02-16 NOTE — Telephone Encounter (Signed)
Advised patient: Will prescribe #270 instead of her usual 300, she takes on average TID . No RF

## 2017-02-17 ENCOUNTER — Encounter: Payer: Self-pay | Admitting: Obstetrics and Gynecology

## 2017-02-26 ENCOUNTER — Encounter: Payer: Self-pay | Admitting: Gastroenterology

## 2017-02-27 ENCOUNTER — Encounter: Payer: Self-pay | Admitting: Physical Therapy

## 2017-02-27 ENCOUNTER — Ambulatory Visit: Payer: PPO | Attending: Orthopedic Surgery | Admitting: Physical Therapy

## 2017-02-27 ENCOUNTER — Other Ambulatory Visit: Payer: Self-pay

## 2017-02-27 DIAGNOSIS — M25632 Stiffness of left wrist, not elsewhere classified: Secondary | ICD-10-CM | POA: Diagnosis not present

## 2017-02-27 DIAGNOSIS — R29898 Other symptoms and signs involving the musculoskeletal system: Secondary | ICD-10-CM | POA: Diagnosis not present

## 2017-02-27 DIAGNOSIS — M25552 Pain in left hip: Secondary | ICD-10-CM | POA: Diagnosis not present

## 2017-02-27 DIAGNOSIS — M25532 Pain in left wrist: Secondary | ICD-10-CM | POA: Insufficient documentation

## 2017-02-27 DIAGNOSIS — M6281 Muscle weakness (generalized): Secondary | ICD-10-CM | POA: Diagnosis not present

## 2017-02-28 NOTE — Therapy (Signed)
[facility] [address]  [place], [place], [address] Phone: [phone]   Fax:  [phone]  Physical Therapy Evaluation  Patient Details  Name: [name] MRN: [id number] Date of Birth: Aug 17, 1945 Referring Provider: Avelina Laine, PA-C   Encounter Date: 02/27/2017  PT End of Session - 02/27/17 1315    Visit Number  1    Number of Visits  16    Date for PT Re-Evaluation  04/27/17    Authorization Type  [employer] - VL: Medicare guidelines (pt reporting insurance plan will change as of 04/10/17)    PT Start Time  1315    PT Stop Time  1407    PT Time Calculation (min)  52 min    Activity Tolerance  Patient tolerated treatment well    Behavior During Therapy  [facility] for tasks assessed/performed       Past Medical History:  Diagnosis Date  . Chest pain    stress test @ [facility] cardiology (-)  . Colonic polyp    bx adenomatous polyps, next scope 2010  . Fibromyalgia    see rheumatology  . Glaucoma   . Hypothyroidism   . Migraine   . 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 Osteopenia   . Pleuritic chest pain    etiology unclear, has beeb eval by pulmonary and rheumatology (autoimmune dz?)  . Wrist fracture 2018    Past Surgical History:  Procedure Laterality Date  . ESOPHAGOGASTRODUODENOSCOPY (EGD) WITH PROPOFOL N/A 03/27/2013   Procedure: ESOPHAGOGASTRODUODENOSCOPY (EGD) WITH PROPOFOL;  Surgeon: Milus Banister, MD;  Location: [facility];  Service: Endoscopy;  Laterality: N/A;  possible dil  . 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  . plastic surgery face  2013  . right rotator cuff repair  2010  . TONSILLECTOMY      There were no vitals filed for this visit.   Subjective Assessment - 02/27/17 1319    Subjective  Pt reports she was walking her dogs when other dogs came running at her. She got tripped up in the dog leash causing her to fall landing on her L wrist, resulting in distal radius  fracture. Hard cast for 6 weeks, followed by splint x2 weeks.    Patient Stated Goals  "get my arm back the way it was"    Currently in Pain?  No/denies    Pain Score  0-No pain up to 7/10 with movement    Pain Location  Wrist    Pain Orientation  Left    Pain Descriptors / Indicators  Sharp    Pain Type  Acute pain    Pain Onset  More than a month ago    Pain Frequency  Intermittent    Aggravating Factors   wrist movements in all directions, pronation/supination    Pain Relieving Factors  OTC & prescription pain meds    Effect of Pain on Daily Activities  unable to pick up objects w/o pain         [facility] PT Assessment - 02/27/17 1315      Assessment   Medical Diagnosis  L distal radius fracture    Referring Provider  Avelina Laine, PA-C    Onset Date/Surgical Date  12/28/16    Hand Dominance  Right    Next MD Visit  03/09/17    Prior Therapy  none for current condition      Precautions   Required  Braces or Orthoses  Other Brace/Splint    Other Brace/Splint  L soft wrist splint      Balance Screen   Has the patient fallen in the past 6 months  Yes    How many times?  1    Has the patient had a decrease in activity level because of a fear of falling?   No    Is the patient reluctant to leave their home because of a fear of falling?   No      Home Film/video editor residence    Living Arrangements  Spouse/significant other      Prior Function   Level of [place]  Retired    Leisure  sewing, beading, jigsaw puzzles      Observation/Other Assessments   Focus on Therapeutic Outcomes (FOTO)   Wrist - 44% (56% limitation); predicted 62% (38% limitation)      ROM / Strength   AROM / PROM / Strength  AROM;Strength      AROM   AROM Assessment Site  Wrist;Forearm;Finger    Right/Left Forearm  Right;Left    Right Forearm Pronation  96 Degrees    Right Forearm Supination  91 Degrees    Left Forearm Pronation  76 Degrees     Left Forearm Supination  58 Degrees    Right/Left Wrist  --    Right Wrist Extension  64 Degrees    Right Wrist Flexion  59 Degrees    Right Wrist Radial Deviation  32 Degrees    Right Wrist Ulnar Deviation  44 Degrees    Left Wrist Extension  41 Degrees    Left Wrist Flexion  28 Degrees    Left Wrist Radial Deviation  30 Degrees    Left Wrist Ulnar Deviation  13 Degrees    Right/Left Finger  --    Right Composite Finger Extension  -- WNL    Right Composite Finger Flexion  -- WNL    Left Composite Finger Extension  75%    Left Composite Finger Flexion  -- WNL      Strength   Strength Assessment Site  Elbow;Forearm;Wrist;Hand    Right/Left Elbow  Right;Left    Right Elbow Flexion  4+/5    Right Elbow Extension  4+/5    Left Elbow Flexion  4/5    Left Elbow Extension  4/5    Right/Left Forearm  Right;Left    Right Forearm Pronation  4+/5    Right Forearm Supination  4+/5    Left Forearm Pronation  4-/5    Left Forearm Supination  3/5    Right/Left Wrist  Right;Left    Right Wrist Flexion  4+/5    Right Wrist Extension  4+/5    Right Wrist Radial Deviation  4+/5    Right Wrist Ulnar Deviation  4+/5    Left Wrist Flexion  4-/5    Left Wrist Extension  4-/5    Left Wrist Radial Deviation  4-/5    Left Wrist Ulnar Deviation  4-/5    Right/Left hand  Right;Left    Right Hand Grip (lbs)  22, 26, 19    Right Hand Lateral Pinch  15 lbs    Right Hand 3 Point Pinch  9 lbs    Left Hand Gross Grasp  Functional    Left Hand Grip (lbs)  <5#    Left Hand Lateral Pinch  8 lbs  Left Hand 3 Point Pinch  5 lbs             Objective measurements completed on examination: See above findings.      [facility] Adult PT Treatment/Exercise - 02/27/17 1315      Exercises   Exercises  Wrist;Hand      Hand Exercises   Opposition  Left;5 reps    Tendon Glides  L hand x5      Wrist Exercises   Forearm Supination  Left;5 reps    Forearm Pronation  Left;5 reps    Wrist Flexion   Left;5 reps    Wrist Flexion Limitations  forearm supported on table    Wrist Extension  Left;5 reps    Wrist Extension Limitations  forearm supported on table    Wrist Radial Deviation  Left;5 reps    Wrist Radial Deviation Limitations  forearm supported on table    Wrist Ulnar Deviation  Left;5 reps    Wrist Ulnar Deviation Limitations  forearm supported on table             PT Education - 02/27/17 1407    Education provided  Yes    Education Details  PT eval findings, anticipated POC & initial HEP    Person(s) Educated  Patient    Methods  Explanation;Demonstration;Handout    Comprehension  Verbalized understanding;Returned demonstration;Need further instruction          PT Long Term Goals - 02/27/17 1407      PT LONG TERM GOAL #1   Title  Independent with ongoing HEP    Status  New    Target Date  04/27/17      PT LONG TERM GOAL #2   Title  L wrist and forearm AROM w/in 5-10 dg of R w/o limitation due to pain     Status  New    Target Date  04/27/17      PT LONG TERM GOAL #3   Title  L wrist and forearm strength >/= 4/5 and average grip strength w/in 10# of R grip strength    Status  New    Target Date  04/27/17      PT LONG TERM GOAL #4   Title  Pt will perform normal ADLs and light household chores w/o limitaiton due to LOM, weakness or pain in L wrist    Status  New    Target Date  04/27/17      PT LONG TERM GOAL #5   Title  Pt will report ability to resume sewing and beading w/o limitaiton due to LOM, weakness or pain in L wrist    Status  New    Target Date  04/27/17             Plan - 02/27/17 1407    Clinical Impression Statement  [name] is a 71 y/o R hand dominant female who presents to OP PT 2 months s/p L distal radius fracture sustained during a fall. Pt initially casted in hard cast x 6 weeks and currently in soft splint for past 2 weeks. L wrist AROM currently limited in all planes with pt noting greatest discomfort with  flexion/extension and pronation/supination motions. Pt noting pain/discomfort more of an issue along mid shaft of ulna than at wrist or radial side of forearm. Weakness noted with all forearm and wrist motions as well as grip strength. Pain, ROM and strength deficits limit functional use of L arm and prevents her from picking up  objects w/o pain. [name] demonstrates good potential to benefit from skilled PT to restore functional ROM and strength in L forearm and wrist while managing pain as indicated.    History and Personal Factors relevant to plan of care:  osteopenia/osteoporosis, fibromyalgia, hypothyroidism, h/o R wrist fracture    Clinical Presentation  Stable    Clinical Presentation due to:  routine post-fracture healing in non-dominant UE    Clinical Decision Making  Low    Rehab Potential  Good    Clinical Impairments Affecting Rehab Potential  osteopenia/osteoporosis, fibromyalgia, hypothyroidism, h/o R wrist fracture    PT Frequency  2x / week    PT Duration  8 weeks    PT Treatment/Interventions  Patient/family education;ADLs/Self Care Home Management;Therapeutic exercise;Therapeutic activities;Neuromuscular re-education;Manual techniques;Passive range of motion;Dry needling;Taping;Electrical Stimulation;Moist Heat;Cryotherapy;Parrafin;Ultrasound;Iontophoresis 4mg /ml Dexamethasone    Consulted and Agree with Plan of Care  Patient       Patient will benefit from skilled therapeutic intervention in order to improve the following deficits and impairments:  Decreased range of motion, Impaired flexibility, Decreased strength, Pain, Impaired UE functional use  Visit Diagnosis: Stiffness of left wrist, not elsewhere classified  Pain in left wrist  Muscle weakness (generalized)  G-Codes - Mar 08, 2017 1407    Functional Assessment Tool Used (Outpatient Only)  Wrist FOTO = 44% (56% limitation)    Functional Limitation  Carrying, moving and handling objects    Carrying, Moving and Handling  Objects Current Status (X3244)  At least 40 percent but less than 60 percent impaired, limited or restricted    Carrying, Moving and Handling Objects Goal Status (W1027)  At least 20 percent but less than 40 percent impaired, limited or restricted        Problem List Patient Active Problem List   Diagnosis Date Noted  . PCP NOTES >>>>>>>>>>>>>>>>>>>> 05/24/2015  . Numbness and tingling of right arm 05/22/2015  . Pain in the chest   . Chest pain 05/21/2015  . Insomnia 09/01/2014  . GERD (gastroesophageal reflux disease) 03/27/2013  . Dysphagia, pharyngoesophageal phase 03/27/2013  . Annual physical exam 06/12/2011  . Sinusitis, chronic 02/02/2011  . Osteoporosis 06/08/2010  . Fibromyalgia, pain mngmt 01/29/2009  . LIPOMA 09/30/2008  . Migraine without aura 03/04/2007  . Hypothyroidism 05/15/2006  . COLONIC POLYPS, HX OF 05/15/2006    Percival Spanish, PT, MPT 02/28/2017, 1:33 PM  [facility] [address]  [place], [place], [address] Phone: [phone]   Fax:  [phone]  Name: [name] MRN: [id number] Date of Birth: Jun 19, 1945

## 2017-03-08 ENCOUNTER — Ambulatory Visit: Payer: PPO | Admitting: Physical Therapy

## 2017-03-08 ENCOUNTER — Encounter: Payer: Self-pay | Admitting: Physical Therapy

## 2017-03-08 DIAGNOSIS — M6281 Muscle weakness (generalized): Secondary | ICD-10-CM

## 2017-03-08 DIAGNOSIS — M25632 Stiffness of left wrist, not elsewhere classified: Secondary | ICD-10-CM

## 2017-03-08 DIAGNOSIS — R29898 Other symptoms and signs involving the musculoskeletal system: Secondary | ICD-10-CM

## 2017-03-08 DIAGNOSIS — M25552 Pain in left hip: Secondary | ICD-10-CM

## 2017-03-08 DIAGNOSIS — M25532 Pain in left wrist: Secondary | ICD-10-CM

## 2017-03-08 NOTE — Therapy (Signed)
[facility] [address]  [place], [place], [address] Phone: [phone]   Fax:  [phone]  Physical Therapy Treatment  Patient Details  Name: [name] MRN: [id number] Date of Birth: [birth year] Referring Provider: Avelina Laine, PA-C   Encounter Date: 03/08/2017  PT End of Session - 03/08/17 1017    Visit Number  2    Number of Visits  16    Date for PT Re-Evaluation  04/27/17    Authorization Type  [employer] - VL: Medicare guidelines (pt reporting insurance plan will change as of 04/10/17)    PT Start Time  1017    PT Stop Time  1102    PT Time Calculation (min)  45 min    Activity Tolerance  Patient tolerated treatment well    Behavior During Therapy  [facility] for tasks assessed/performed       Past Medical History:  Diagnosis Date  . Chest pain    stress test @ [facility] cardiology (-)  . Colonic polyp    bx adenomatous polyps, next scope 2010  . Fibromyalgia    see rheumatology  . Glaucoma   . Hypothyroidism   . Migraine   . 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 Osteopenia   . Pleuritic chest pain    etiology unclear, has beeb eval by pulmonary and rheumatology (autoimmune dz?)  . Wrist fracture 2018    Past Surgical History:  Procedure Laterality Date  . ESOPHAGOGASTRODUODENOSCOPY (EGD) WITH PROPOFOL N/A 03/27/2013   Procedure: ESOPHAGOGASTRODUODENOSCOPY (EGD) WITH PROPOFOL;  Surgeon: Milus Banister, MD;  Location: [facility];  Service: Endoscopy;  Laterality: N/A;  possible dil  . 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  . plastic surgery face  2013  . right rotator cuff repair  2010  . TONSILLECTOMY      There were no vitals filed for this visit.  Subjective Assessment - 03/08/17 1025    Subjective  Pt reporting initial HEP helped greatly.    Patient Stated Goals  "get my arm back the way it was"    Currently in Pain?  No/denies    Pain Score  0-No pain    Pain Onset  More than  a month ago                      [facility] Adult PT Treatment/Exercise - 03/08/17 1017      Exercises   Exercises  Elbow;Wrist;Hand      Elbow Exercises   Forearm Supination  Left;10 reps    Forearm Pronation  Left;10 reps    Wrist Flexion  Left;10 reps;Bar weights/barbell;Strengthening;Seated    Bar Weights/Barbell (Wrist Flexion)  1 lb    Wrist Flexion Limitations  forearm supported on table    Wrist Extension  Left;10 reps;Bar weights/barbell;Strengthening;Seated    Bar Weights/Barbell (Wrist Extension)  1 lb    Wrist Extension Limitations  forearm supported on table      Shoulder Exercises: ROM/Strengthening   UBE (Upper Arm Bike)  lvl 1.0 fwd/back x2' each      Hand Exercises   Opposition  Left;10 reps;Seated    Theraputty - Grip  yellow x20    Tendon Glides  L hand x10      Wrist Exercises   Bar Weights/Barbell (Forearm Supination)  1 lb x10    Bar Weights/Barbell (Forearm Pronation)  1 lb x10    Wrist Radial Deviation  Left;10 reps;Seated    Wrist Radial Deviation Limitations  forearm supported on table    Wrist Ulnar Deviation  Left;10 reps    Wrist Ulnar Deviation Limitations  forearm supported on table      Manual Therapy   Manual Therapy  Soft tissue mobilization    Soft tissue mobilization  STM to L wrist extensor group             PT Education - 03/08/17 1102    Education provided  Yes    Education Details  Yellow theraputty provided for home use    Person(s) Educated  Patient    Methods  Explanation;Demonstration    Comprehension  Verbalized understanding;Returned demonstration          PT Long Term Goals - 03/08/17 1038      PT LONG TERM GOAL #1   Title  Independent with ongoing HEP    Status  On-going      PT LONG TERM GOAL #2   Title  L wrist and forearm AROM w/in 5-10 dg of R w/o limitation due to pain     Status  On-going      PT LONG TERM GOAL #3   Title  L wrist and forearm strength >/= 4/5 and average grip strength  w/in 10# of R grip strength    Status  On-going      PT LONG TERM GOAL #4   Title  Pt will perform normal ADLs and light household chores w/o limitaiton due to LOM, weakness or pain in L wrist    Status  On-going      PT LONG TERM GOAL #5   Title  Pt will report ability to resume sewing and beading w/o limitaiton due to LOM, weakness or pain in L wrist    Status  On-going            Plan - 03/08/17 1039    Clinical Impression Statement  Pt noting improving motion after completion of HEP but still feels greatest restriction in radial & ulnar deviation. HEP reviewed with pt providing good return demonstration and able to tolerate introduction of light weights with some motions. Increased muscle tension noted in wrist extensor group which responded well to [facility] with pt noting less discomfort with ROM following this.    Rehab Potential  Good    Clinical Impairments Affecting Rehab Potential  osteopenia/osteoporosis, fibromyalgia, hypothyroidism, h/o R wrist fracture    PT Treatment/Interventions  Patient/family education;ADLs/Self Care Home Management;Therapeutic exercise;Therapeutic activities;Neuromuscular re-education;Manual techniques;Passive range of motion;Dry needling;Taping;Electrical Stimulation;Moist Heat;Cryotherapy;Parrafin;Ultrasound;Iontophoresis 4mg /ml Dexamethasone    Consulted and Agree with Plan of Care  Patient       Patient will benefit from skilled therapeutic intervention in order to improve the following deficits and impairments:  Decreased range of motion, Impaired flexibility, Decreased strength, Pain, Impaired UE functional use  Visit Diagnosis: Stiffness of left wrist, not elsewhere classified  Pain in left wrist  Muscle weakness (generalized)  Pain in left hip  Weakness of both hips     Problem List Patient Active Problem List   Diagnosis Date Noted  . PCP NOTES >>>>>>>>>>>>>>>>>>>> 05/24/2015  . Numbness and tingling of right arm 05/22/2015  .  Pain in the chest   . Chest pain 05/21/2015  . Insomnia 09/01/2014  . GERD (gastroesophageal reflux disease) 03/27/2013  . Dysphagia, pharyngoesophageal phase 03/27/2013  . Annual physical exam 06/12/2011  . Sinusitis, chronic 02/02/2011  . Osteoporosis 06/08/2010  . Fibromyalgia, pain mngmt 01/29/2009  .  LIPOMA 09/30/2008  . Migraine without aura 03/04/2007  . Hypothyroidism 05/15/2006  . COLONIC POLYPS, HX OF 05/15/2006    Percival Spanish, PT, MPT 03/08/2017, 6:25 PM  [facility] [address]  [place], [place], [address] Phone: [phone]   Fax:  [phone]  Name: [name] MRN: [id number] Date of Birth: 12-30-1945

## 2017-03-09 DIAGNOSIS — S52592D Other fractures of lower end of left radius, subsequent encounter for closed fracture with routine healing: Secondary | ICD-10-CM | POA: Diagnosis not present

## 2017-03-09 DIAGNOSIS — M25532 Pain in left wrist: Secondary | ICD-10-CM | POA: Diagnosis not present

## 2017-03-13 ENCOUNTER — Ambulatory Visit: Payer: PPO | Attending: Orthopedic Surgery | Admitting: Physical Therapy

## 2017-03-13 ENCOUNTER — Encounter: Payer: Self-pay | Admitting: Physical Therapy

## 2017-03-13 DIAGNOSIS — M25632 Stiffness of left wrist, not elsewhere classified: Secondary | ICD-10-CM | POA: Diagnosis not present

## 2017-03-13 DIAGNOSIS — M6281 Muscle weakness (generalized): Secondary | ICD-10-CM | POA: Diagnosis not present

## 2017-03-13 DIAGNOSIS — M25532 Pain in left wrist: Secondary | ICD-10-CM | POA: Insufficient documentation

## 2017-03-13 NOTE — Therapy (Signed)
[facility] [address]  [place], [place], [address] Phone: [phone]   Fax:  [phone]  Physical Therapy Treatment  Patient Details  Name: [name] MRN: [id number] Date of Birth: 1945-12-07 Referring Provider: Avelina Laine, PA-C   Encounter Date: 03/13/2017  PT End of Session - 03/13/17 1021    Visit Number  3    Number of Visits  16    Date for PT Re-Evaluation  04/27/17    Authorization Type  [employer] - VL: Medicare guidelines (pt reporting insurance plan will change as of 04/10/17)    PT Start Time  1021    PT Stop Time  1110    PT Time Calculation (min)  49 min    Activity Tolerance  Patient tolerated treatment well    Behavior During Therapy  [facility] for tasks assessed/performed       Past Medical History:  Diagnosis Date  . Chest pain    stress test @ [facility] cardiology (-)  . Colonic polyp    bx adenomatous polyps, next scope 2010  . Fibromyalgia    see rheumatology  . Glaucoma   . Hypothyroidism   . Migraine   . 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 Osteopenia   . Pleuritic chest pain    etiology unclear, has beeb eval by pulmonary and rheumatology (autoimmune dz?)  . Wrist fracture 2018    Past Surgical History:  Procedure Laterality Date  . ESOPHAGOGASTRODUODENOSCOPY (EGD) WITH PROPOFOL N/A 03/27/2013   Procedure: ESOPHAGOGASTRODUODENOSCOPY (EGD) WITH PROPOFOL;  Surgeon: Milus Banister, MD;  Location: [facility];  Service: Endoscopy;  Laterality: N/A;  possible dil  . 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  . plastic surgery face  2013  . right rotator cuff repair  2010  . TONSILLECTOMY      There were no vitals filed for this visit.  Subjective Assessment - 03/13/17 1024    Subjective  Pt reporting MD wanting her to hold off on pronation/supination at present. Notes wrist feeling better now that out of wrist brace and in just thumb spica splint.    Patient Stated Goals   "get my arm back the way it was"    Currently in Pain?  No/denies    Pain Score  0-No pain    Pain Onset  More than a month ago                      [facility] Adult PT Treatment/Exercise - 03/13/17 1021      Exercises   Exercises  Elbow;Wrist;Hand      Elbow Exercises   Wrist Flexion  Left;15 reps;Seated;Bar weights/barbell;Strengthening    Bar Weights/Barbell (Wrist Flexion)  1 lb    Wrist Flexion Limitations  forearm supported on table    Wrist Extension Limitations  forearm supported on table      Hand Exercises   Digiticizer  L hand red (3.0 lb) Digi-Flex x10    Small Pegboard  1 row each color with empty row spacing    Other Hand Exercises  Bolt box - large, mid & small size nut/bolts to R & top      Wrist Exercises   Wrist Radial Deviation  Left;15 reps;Seated    Wrist Radial Deviation Limitations  forearm supported on table - lifting against gravity    Wrist Ulnar Deviation  Left;15 reps;Strengthening    Wrist Ulnar Deviation  Limitations  shoulder extended against gravity      Modalities   Modalities  Cryotherapy      Cryotherapy   Number Minutes Cryotherapy  10 Minutes    Cryotherapy Location  Wrist;Hand    Type of Cryotherapy  Ice pack                  PT Long Term Goals - 03/08/17 1038      PT LONG TERM GOAL #1   Title  Independent with ongoing HEP    Status  On-going      PT LONG TERM GOAL #2   Title  L wrist and forearm AROM w/in 5-10 dg of R w/o limitation due to pain     Status  On-going      PT LONG TERM GOAL #3   Title  L wrist and forearm strength >/= 4/5 and average grip strength w/in 10# of R grip strength    Status  On-going      PT LONG TERM GOAL #4   Title  Pt will perform normal ADLs and light household chores w/o limitaiton due to LOM, weakness or pain in L wrist    Status  On-going      PT LONG TERM GOAL #5   Title  Pt will report ability to resume sewing and beading w/o limitaiton due to LOM, weakness or pain in  L wrist    Status  On-going            Plan - 03/13/17 1031    Clinical Impression Statement  Pt reporting MD pleased with progress but wanting her to hold off on pronation/supination. Pt noting lessening pain and stiffness since switching from the wrist brace to the thumb splint. Added dexterity exercises/activities with good pt tolerance, however pt requesting to end session with ice noting some post-exercise soreness after last visit.    Rehab Potential  Good    Clinical Impairments Affecting Rehab Potential  osteopenia/osteoporosis, fibromyalgia, hypothyroidism, h/o R wrist fracture    PT Treatment/Interventions  Patient/family education;ADLs/Self Care Home Management;Therapeutic exercise;Therapeutic activities;Neuromuscular re-education;Manual techniques;Passive range of motion;Dry needling;Taping;Electrical Stimulation;Moist Heat;Cryotherapy;Parrafin;Ultrasound;Iontophoresis 4mg /ml Dexamethasone    Consulted and Agree with Plan of Care  Patient       Patient will benefit from skilled therapeutic intervention in order to improve the following deficits and impairments:  Decreased range of motion, Impaired flexibility, Decreased strength, Pain, Impaired UE functional use  Visit Diagnosis: Stiffness of left wrist, not elsewhere classified  Pain in left wrist     Problem List Patient Active Problem List   Diagnosis Date Noted  . PCP NOTES >>>>>>>>>>>>>>>>>>>> 05/24/2015  . Numbness and tingling of right arm 05/22/2015  . Pain in the chest   . Chest pain 05/21/2015  . Insomnia 09/01/2014  . GERD (gastroesophageal reflux disease) 03/27/2013  . Dysphagia, pharyngoesophageal phase 03/27/2013  . Annual physical exam 06/12/2011  . Sinusitis, chronic 02/02/2011  . Osteoporosis 06/08/2010  . Fibromyalgia, pain mngmt 01/29/2009  . LIPOMA 09/30/2008  . Migraine without aura 03/04/2007  . Hypothyroidism 05/15/2006  . COLONIC POLYPS, HX OF 05/15/2006    Percival Spanish, PT,  MPT 03/13/2017, 1:14 PM  [facility] [address]  [place], [place], [address] Phone: [phone]   Fax:  [phone]  Name: [name] MRN: [id number] Date of Birth: 1946-03-13

## 2017-03-15 ENCOUNTER — Encounter: Payer: Self-pay | Admitting: Internal Medicine

## 2017-03-15 ENCOUNTER — Ambulatory Visit: Payer: PPO | Admitting: Internal Medicine

## 2017-03-15 VITALS — BP 124/72 | HR 72 | Temp 97.6°F | Resp 14 | Ht 65.0 in | Wt 181.1 lb

## 2017-03-15 DIAGNOSIS — E039 Hypothyroidism, unspecified: Secondary | ICD-10-CM | POA: Diagnosis not present

## 2017-03-15 DIAGNOSIS — G8929 Other chronic pain: Secondary | ICD-10-CM | POA: Diagnosis not present

## 2017-03-15 DIAGNOSIS — M549 Dorsalgia, unspecified: Secondary | ICD-10-CM

## 2017-03-15 DIAGNOSIS — M810 Age-related osteoporosis without current pathological fracture: Secondary | ICD-10-CM

## 2017-03-15 NOTE — Progress Notes (Signed)
Pre visit review using our clinic review tool, if applicable. No additional management support is needed unless otherwise documented below in the visit note. 

## 2017-03-15 NOTE — Patient Instructions (Addendum)
  GO TO THE FRONT DESK Schedule a lab appointment 6 weeks from today: BMP, TSH.  Schedule your next appointment for a physical exam in 6 months   Continue hydrocodone 3 times a day Do not mix hydrocodone with other products containing Tylenol (acetaminophen)   Motrin 200 mg OTC: 1 or 2 daily with food.  Watch for stomach irritation, change in the color of the stools.

## 2017-03-15 NOTE — Progress Notes (Signed)
Subjective:    Patient ID: [name], female    DOB: December 02, 1945, 71 y.o.   MRN: [id number]  DOS:  03/15/2017 Type of visit - description : Routine visit Interval history: Since last  visit, she had a fracture of the left wrist.  Seen orthopedic surgery Also, recently had a bone density test with gynecology.    Pain management: Taking hydrocodone 3-4  tablets daily.  Because the recent fracture she took NSAIDs OTC a few times with no apparent side effects.  Review of Systems   Past Medical History:  Diagnosis Date  . Chest pain    stress test @ [facility] cardiology (-)  . Colonic polyp    bx adenomatous polyps, next scope 2010  . Fibromyalgia    see rheumatology  . Glaucoma   . Hypothyroidism   . Migraine   . 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 Osteopenia   . Pleuritic chest pain    etiology unclear, has beeb eval by pulmonary and rheumatology (autoimmune dz?)  . Wrist fracture 2018    Past Surgical History:  Procedure Laterality Date  . ESOPHAGOGASTRODUODENOSCOPY (EGD) WITH PROPOFOL N/A 03/27/2013   Procedure: ESOPHAGOGASTRODUODENOSCOPY (EGD) WITH PROPOFOL;  Surgeon: Milus Banister, MD;  Location: [facility];  Service: Endoscopy;  Laterality: N/A;  possible dil  . 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  . plastic surgery face  2013  . right rotator cuff repair  2010  . TONSILLECTOMY      Social History   Socioeconomic History  . Marital status: Married    Spouse name: Not on file  . Number of children: 0  . Years of education: Not on file  . Highest education level: Not on file  Social Needs  . Financial resource strain: Not on file  . Food insecurity - worry: Not on file  . Food insecurity - inability: Not on file  . Transportation needs - medical: Not on file  . Transportation needs - non-medical: Not on file  Occupational History  . 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 Smoking status: Former Smoker   Packs/day: 1.00    Years: 38.50    Pack years: 38.50    Types: Cigarettes    Last attempt to quit: 01/13/2004    Years since quitting: 13.1  . Smokeless tobacco: Never Used  . Tobacco comment: quit 2005  Substance and Sexual Activity  . Alcohol use: No    Alcohol/week: 0.0 oz    Comment: rare  . Drug use: No  . Sexual activity: Not Currently  Other Topics Concern  . Not on file  Social History Narrative   No biological children         Allergies as of 03/15/2017      Reactions   Alendronate Sodium    REACTION: urticaria (hives)   Bactrim [sulfamethoxazole-trimethoprim]    Upset stomach   Contrast Media [iodinated Diagnostic Agents] Hives   Milnacipran    REACTION: increased bp, increased hr   Penicillins    REACTION: urticaria (hives)      Medication List        Accurate as of 03/15/17 10:50 AM. Always use your most recent med list.          ALPRAZolam 0.5 MG tablet Commonly known as:  XANAX Take 1 tablet (0.5 mg total) by mouth at bedtime as needed for anxiety.   calcium carbonate 1250 (500  Ca) MG chewable tablet Commonly known as:  OS-CAL Chew 1 tablet by mouth daily.   conjugated estrogens vaginal cream Commonly known as:  PREMARIN Place 1/2 gram per vagina at bedtime twice a week.   diphenhydramine-acetaminophen 25-500 MG Tabs tablet Commonly known as:  TYLENOL PM Take 1 tablet by mouth at bedtime as needed.   HYDROcodone-acetaminophen 5-325 MG tablet Commonly known as:  NORCO/VICODIN Take 1 tablet every 6 (six) hours as needed by mouth for severe pain.   levothyroxine 88 MCG tablet Commonly known as:  SYNTHROID, LEVOTHROID Take 1 tablet (88 mcg total) by mouth daily before breakfast.   multivitamin capsule Take 1 capsule by mouth daily.   polycarbophil 625 MG tablet Commonly known as:  FIBERCON Take 625 mg by mouth daily.   Vitamin D3 1000 units Caps Take 1,200 Units by mouth.          Objective:   Physical Exam BP 124/72 (BP  Location: Left Arm, Patient Position: Sitting, Cuff Size: Small)   Pulse 72   Temp 97.6 F (36.4 C) (Oral)   Resp 14   Ht 5\' 5"  (1.651 m)   Wt 181 lb 2 oz (82.2 kg)   LMP 04/10/1998 (Approximate)   SpO2 98%   BMI 30.14 kg/m  General:   Well developed, well nourished . NAD.  HEENT:  Normocephalic . Face symmetric, atraumatic Lungs:  CTA B Normal respiratory effort, no intercostal retractions, no accessory muscle use. Heart: RRR,  no murmur.  No pretibial edema bilaterally  Skin: Not pale. Not jaundice Neurologic:  alert & oriented X3.  Speech normal, gait appropriate for age and unassisted Psych--  Cognition and judgment appear intact.  Cooperative with normal attention span and concentration.  Behavior appropriate. No anxious or depressed appearing.      Assessment & Plan:    Assessment Hypothyroidism GERD Migraines insmonia-- xanax prn MSK- Pain mngmt -- back pain, 2017 had a MRI, saw Dr Durward Fortes, Rx local injection, PT Osteopenia:  --Used ot see rheumatology --T score 2011   -2.6 (intolerant to fosamax, ok w/ Boniva x years) --T score 01-2013   -2.6 --T score 02-2015  -2.3, rx ca and cit D  Glaucoma Ocular Rosacea 2010 Lung cancer screening CT 09-2015 H/o Fibromyalgia CP admitted, stress lest low risk 05-2015 H/o Pleuritic chest pain, on-off, resolved   PLAN: Hypothyroidism: Check TSH in 6 weeks Pain management, back pain: This was the main topic during this visit, she has been taking 3 or 4 hydrocodone's daily for long time.  She also takes Xanax.  Goal is to decrease the need to take hydrocodone.  We discussed referral to pain management, patient is hesitant, after a extensive conversation we agree on: Take ibuprofen 200 mg 1 or 2 tablets daily with food, watch for GI side effects. Try to decrease hydrocodone to two tablets daily. Watch for acetaminophen intake from other medications. BMP in 6 weeks to check renal function Osteopenia: Recently had a left  fracture wrist fracture, had a DEXA recently, plans to discuss with gynecology for further management. RTC 6 months, CPX F2F > 18

## 2017-03-15 NOTE — Assessment & Plan Note (Signed)
Hypothyroidism: Check TSH in 6 weeks Pain management, back pain: This was the main topic during this visit, she has been taking 3 or 4 hydrocodone's daily for long time.  She also takes Xanax.  Goal is to decrease the need to take hydrocodone.  We discussed referral to pain management, patient is hesitant, after a extensive conversation we agree on: Take ibuprofen 200 mg 1 or 2 tablets daily with food, watch for GI side effects. Try to decrease hydrocodone to two tablets daily. Watch for acetaminophen intake from other medications. BMP in 6 weeks to check renal function Osteopenia: Recently had a left fracture wrist fracture, had a DEXA recently, plans to discuss with gynecology for further management. RTC 6 months, CPX F2F > 18

## 2017-03-16 ENCOUNTER — Other Ambulatory Visit: Payer: Self-pay

## 2017-03-16 ENCOUNTER — Ambulatory Visit: Payer: PPO

## 2017-03-16 ENCOUNTER — Encounter: Payer: Self-pay | Admitting: Obstetrics and Gynecology

## 2017-03-16 ENCOUNTER — Ambulatory Visit (INDEPENDENT_AMBULATORY_CARE_PROVIDER_SITE_OTHER): Payer: PPO | Admitting: Obstetrics and Gynecology

## 2017-03-16 VITALS — BP 142/84 | HR 66 | Resp 14 | Ht 65.0 in | Wt 181.0 lb

## 2017-03-16 DIAGNOSIS — M25632 Stiffness of left wrist, not elsewhere classified: Secondary | ICD-10-CM

## 2017-03-16 DIAGNOSIS — M25532 Pain in left wrist: Secondary | ICD-10-CM

## 2017-03-16 DIAGNOSIS — M8580 Other specified disorders of bone density and structure, unspecified site: Secondary | ICD-10-CM | POA: Diagnosis not present

## 2017-03-16 NOTE — Patient Instructions (Signed)

## 2017-03-16 NOTE — Progress Notes (Signed)
GYNECOLOGY  VISIT   HPI: 71 y.o.   Married  Caucasian  female   [id number] with Patient's last menstrual period was 04/10/1998 (approximate).   here for BMD consult.  BMD done 02/15/17 at [facility]:  T score of spine -1.5.  (Was -1.2 on 02/10/15.) T score on right hip -2.4. (Was -2.3 on 02/10/15.)  Hx Fosamax use for 8 years.  Hives listed as side effect in Epic.  Off for 10 years.   Distal radius fracture in September 2018.  Doing physical therapy for weakness.   No steroid use.  No chronic diarrhea.  Taking gummy calcium and vit D. Not a smoker.  Quit a decade ago.  Does not drink ETOH.   Stable Synthroid dosage.   GYNECOLOGIC HISTORY: Patient's last menstrual period was 04/10/1998 (approximate). Contraception:  Post menopausal  Menopausal hormone therapy:  Premarin vaginal cream  Last mammogram:  02/15/17 BIRADS 1 negative  Last pap smear:   01/03/17 negative, 01/08/12 negative          OB History    Gravida Para Term Preterm AB Living   0 0 0 0 0 0   SAB TAB Ectopic Multiple Live Births   0 0 0 0           Patient Active Problem List   Diagnosis Date Noted  . Chronic back pain 03/15/2017  . PCP NOTES >>>>>>>>>>>>>>>>>>>> 05/24/2015  . Numbness and tingling of right arm 05/22/2015  . Pain in the chest   . Chest pain 05/21/2015  . Insomnia 09/01/2014  . GERD (gastroesophageal reflux disease) 03/27/2013  . Dysphagia, pharyngoesophageal phase 03/27/2013  . Annual physical exam 06/12/2011  . Sinusitis, chronic 02/02/2011  . Osteoporosis 06/08/2010  . Fibromyalgia, pain mngmt 01/29/2009  . LIPOMA 09/30/2008  . Migraine without aura 03/04/2007  . Hypothyroidism 05/15/2006  . COLONIC POLYPS, HX OF 05/15/2006    Past Medical History:  Diagnosis Date  . Chest pain    stress test @ [facility] cardiology (-)  . Colonic polyp    bx adenomatous polyps, next scope 2010  . Fibromyalgia    see rheumatology  . Glaucoma   . Hypothyroidism   . Migraine   . Ocular  rosacea 03/2009   on doxy  . Osteopenia   . Pleuritic chest pain    etiology unclear, has beeb eval by pulmonary and rheumatology (autoimmune dz?)  . Wrist fracture 2018    Past Surgical History:  Procedure Laterality Date  . ESOPHAGOGASTRODUODENOSCOPY (EGD) WITH PROPOFOL N/A 03/27/2013   Procedure: ESOPHAGOGASTRODUODENOSCOPY (EGD) WITH PROPOFOL;  Surgeon: Milus Banister, MD;  Location: [facility];  Service: Endoscopy;  Laterality: N/A;  possible dil  . 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  . plastic surgery face  2013  . right rotator cuff repair  2010  . TONSILLECTOMY      Current Outpatient Medications  Medication Sig Dispense Refill  . ALPRAZolam (XANAX) 0.5 MG tablet Take 1 tablet (0.5 mg total) by mouth at bedtime as needed for anxiety. 30 tablet 5  . calcium carbonate (OS-CAL) 1250 (500 CA) MG chewable tablet Chew 1 tablet by mouth daily.    . Cholecalciferol (VITAMIN D3) 1000 UNITS CAPS Take 1,200 Units by mouth.    . conjugated estrogens (PREMARIN) vaginal cream Place 1/2 gram per vagina at bedtime twice a week. 30 g 0  . HYDROcodone-acetaminophen (NORCO/VICODIN) 5-325 MG tablet Take 1 tablet every 6 (six) hours as needed by mouth for severe pain.  270 tablet 0  . levothyroxine (SYNTHROID, LEVOTHROID) 88 MCG tablet Take 1 tablet (88 mcg total) by mouth daily before breakfast. 90 tablet 1  . Multiple Vitamin (MULTIVITAMIN) capsule Take 1 capsule by mouth daily.      . polycarbophil (FIBERCON) 625 MG tablet Take 625 mg by mouth daily.       No current facility-administered medications for this visit.      ALLERGIES: Alendronate sodium; Bactrim [sulfamethoxazole-trimethoprim]; Contrast media [iodinated diagnostic agents]; Milnacipran; and Penicillins  Family History  Adopted: Yes  Problem Relation Age of Onset  . Colon cancer Neg Hx   . Cancer Neg Hx   . Depression Neg Hx   . Diabetes Neg Hx   . Stroke Neg Hx   . Hypertension Neg Hx   . Heart disease Neg Hx      Social History   Socioeconomic History  . Marital status: Married    Spouse name: Not on file  . Number of children: 0  . Years of education: Not on file  . Highest education level: Not on file  Social Needs  . Financial resource strain: Not on file  . Food insecurity - worry: Not on file  . Food insecurity - inability: Not on file  . Transportation needs - medical: Not on file  . Transportation needs - non-medical: Not on file  Occupational History  . 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 Smoking status: Former Smoker    Packs/day: 1.00    Years: 38.50    Pack years: 38.50    Types: Cigarettes    Last attempt to quit: 01/13/2004    Years since quitting: 13.1  . Smokeless tobacco: Never Used  . Tobacco comment: quit 2005  Substance and Sexual Activity  . Alcohol use: No    Alcohol/week: 0.0 oz    Comment: rare  . Drug use: No  . Sexual activity: Not Currently  Other Topics Concern  . Not on file  Social History Narrative   No biological children       ROS:  Pertinent items are noted in HPI.  PHYSICAL EXAMINATION:    BP (!) 142/84 (BP Location: Left Arm, Patient Position: Sitting, Cuff Size: Normal)   Pulse 66   Resp 14   Ht 5\' 5"  (1.651 m)   Wt 181 lb (82.1 kg)   LMP 04/10/1998 (Approximate)   BMI 30.12 kg/m     General appearance: alert, cooperative and appears stated age   ASSESSMENT  Osteopenia.  Recent fracture.  Increased risk for fractures by FRAX.  Allergy to Fosamax.  Hypothyroidism.  PLAN  We discussed BMD from 2016 and 2018.  We reviewed steopenia/osteoporosis/FRAX model/tx options/fracture risk reduction.  Due to allergy to Fosamax listed in Epic, will precert Prolia after January.  Continue calcium 1200 mg daily in divided dosages and vit D 800 IU daily.  Weight bearing exercise recommended.   An After Visit Summary was printed and given to the patient.  __25____ minutes face to face time of  which over 50% was spent in counseling.

## 2017-03-16 NOTE — Therapy (Signed)
[facility] [address]  [place], [place], [address] Phone: [phone]   Fax:  [phone]  Physical Therapy Treatment  Patient Details  Name: [name] MRN: [id number] Date of Birth: 01-02-1946 Referring Provider: Avelina Laine, PA-C   Encounter Date: 03/16/2017  PT End of Session - 03/16/17 1137    Visit Number  4    Number of Visits  16    Date for PT Re-Evaluation  04/27/17    Authorization Type  HT Advantage - VL: Medicare guidelines (pt reporting insurance plan will change as of 04/10/17)    PT Start Time  1102    PT Stop Time  1152 10 min ice to end treatment    PT Time Calculation (min)  50 min    Activity Tolerance  Patient tolerated treatment well    Behavior During Therapy  [facility] for tasks assessed/performed       Past Medical History:  Diagnosis Date  . Chest pain    stress test @ [facility] cardiology (-)  . Colonic polyp    bx adenomatous polyps, next scope 2010  . Fibromyalgia    see rheumatology  . Glaucoma   . Hypothyroidism   . Migraine   . 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 Osteopenia   . Pleuritic chest pain    etiology unclear, has beeb eval by pulmonary and rheumatology (autoimmune dz?)  . Wrist fracture 2018    Past Surgical History:  Procedure Laterality Date  . ESOPHAGOGASTRODUODENOSCOPY (EGD) WITH PROPOFOL N/A 03/27/2013   Procedure: ESOPHAGOGASTRODUODENOSCOPY (EGD) WITH PROPOFOL;  Surgeon: Milus Banister, MD;  Location: [facility];  Service: Endoscopy;  Laterality: N/A;  possible dil  . 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  . plastic surgery face  2013  . right rotator cuff repair  2010  . TONSILLECTOMY      There were no vitals filed for this visit.  Subjective Assessment - 03/16/17 1110    Subjective  Pt. reporting she felt good yesterday and was trying on clothes and felt like she "overdid it" and has been sore at outer wrist since this point.      Patient  Stated Goals  "get my arm back the way it was"    Currently in Pain?  Yes    Pain Score  3     Pain Location  Wrist    Pain Orientation  Left;Medial    Pain Descriptors / Indicators  Sharp    Pain Type  Acute pain    Pain Onset  More than a month ago    Pain Frequency  Intermittent    Multiple Pain Sites  No                      [facility] Adult PT Treatment/Exercise - 03/16/17 1104      Elbow Exercises   Wrist Flexion  Left;15 reps;Seated;Bar weights/barbell;Strengthening    Bar Weights/Barbell (Wrist Flexion)  1 lb    Wrist Flexion Limitations  forearm support on table     Wrist Extension  Left;Strengthening;15 reps;Seated;Bar weights/barbell    Bar Weights/Barbell (Wrist Extension)  1 lb    Wrist Extension Limitations  forearm supported on table     Other elbow exercises  L wrist extenion, flexion stretch x 30 sec each way      Shoulder Exercises: ROM/Strengthening   UBE (Upper Arm Bike)  lvl 1.0 fwd/back  x 3' each      Hand Exercises   Large Pegboard  Dowel board - #1 row thumb and 5th digit, 2# row thumb and 4th digit, 3# row thumb and 3rd digits, 4# row thumb and 2nd digit each color x 1 round dowel replacement     Other Hand Exercises  Alternating clothes pin pinch with thumb and opposing digits 3x with each    Other Hand Exercises  --      Wrist Exercises   Wrist Radial Deviation  10 reps;Left;Seated    Wrist Radial Deviation Limitations  1#; forearm support on table - lifting against gravity    Wrist Ulnar Deviation  10 reps;Left;Strengthening    Wrist Ulnar Deviation Limitations  1#; shoulder extended against gravity     Other wrist exercises  L wrist Flexion/extension gentle self-stretch x 30 sec      Modalities   Modalities  Cryotherapy      Cryotherapy   Number Minutes Cryotherapy  10 Minutes    Cryotherapy Location  Wrist;Hand    Type of Cryotherapy  Ice pack                  PT Long Term Goals - 03/08/17 1038      PT LONG TERM GOAL  #1   Title  Independent with ongoing HEP    Status  On-going      PT LONG TERM GOAL #2   Title  L wrist and forearm AROM w/in 5-10 dg of R w/o limitation due to pain     Status  On-going      PT LONG TERM GOAL #3   Title  L wrist and forearm strength >/= 4/5 and average grip strength w/in 10# of R grip strength    Status  On-going      PT LONG TERM GOAL #4   Title  Pt will perform normal ADLs and light household chores w/o limitaiton due to LOM, weakness or pain in L wrist    Status  On-going      PT LONG TERM GOAL #5   Title  Pt will report ability to resume sewing and beading w/o limitaiton due to LOM, weakness or pain in L wrist    Status  On-going            Plan - 03/16/17 1138    Clinical Impression Statement  [name] doing well today however reporting some L wrist "soreness" following shopping and "trying on a lot of clothes" Wednesday.  Tolerated all ROM and strengthening activities today in treatment well without issue.  Admits she is not using "hand putty" at home due to not wanting to get hands oily.  Ended with ice as pt. reports benefit from this.  Will continue to progress as able in coming visit.      Clinical Impairments Affecting Rehab Potential  osteopenia/osteoporosis, fibromyalgia, hypothyroidism, h/o R wrist fracture    PT Treatment/Interventions  Patient/family education;ADLs/Self Care Home Management;Therapeutic exercise;Therapeutic activities;Neuromuscular re-education;Manual techniques;Passive range of motion;Dry needling;Taping;Electrical Stimulation;Moist Heat;Cryotherapy;Parrafin;Ultrasound;Iontophoresis 4mg /ml Dexamethasone       Patient will benefit from skilled therapeutic intervention in order to improve the following deficits and impairments:  Decreased range of motion, Impaired flexibility, Decreased strength, Pain, Impaired UE functional use  Visit Diagnosis: Stiffness of left wrist, not elsewhere classified  Pain in left wrist     Problem  List Patient Active Problem List   Diagnosis Date Noted  . Chronic back pain 03/15/2017  . PCP  NOTES >>>>>>>>>>>>>>>>>>>> 05/24/2015  . Numbness and tingling of right arm 05/22/2015  . Pain in the chest   . Chest pain 05/21/2015  . Insomnia 09/01/2014  . GERD (gastroesophageal reflux disease) 03/27/2013  . Dysphagia, pharyngoesophageal phase 03/27/2013  . Annual physical exam 06/12/2011  . Sinusitis, chronic 02/02/2011  . Osteoporosis 06/08/2010  . Fibromyalgia, pain mngmt 01/29/2009  . LIPOMA 09/30/2008  . Migraine without aura 03/04/2007  . Hypothyroidism 05/15/2006  . COLONIC POLYPS, HX OF 05/15/2006    Bess Harvest, PTA 03/16/17 12:06 PM   [facility] [address]  [place], [place], [address] Phone: [phone]   Fax:  [phone]  Name: [name] MRN: [id number] Date of Birth: 25-Dec-1945

## 2017-03-17 ENCOUNTER — Telehealth: Payer: Self-pay | Admitting: Obstetrics and Gynecology

## 2017-03-17 NOTE — Telephone Encounter (Signed)
Patient will have new insurance starting in January 2019.  Cc- [name]

## 2017-03-17 NOTE — Telephone Encounter (Signed)
Please precert Prolia for treatment of osteopenia and recent fracture and contact patient back with protocol.  Patient has an allergy to Fosamax with hives, so she cannot take bisphosphonates.   Cc- [name]

## 2017-03-20 ENCOUNTER — Ambulatory Visit: Payer: PPO

## 2017-03-21 MED ORDER — DENOSUMAB 60 MG/ML ~~LOC~~ SOLN: 60.0000 mg | SUBCUTANEOUS | 1 refills | Status: DC

## 2017-03-21 NOTE — Telephone Encounter (Signed)
Rx for Prolia 60 mg/ml inject 60 mg into the skin every 6 months #1 1RF sent to [facility]. PA will be sent to the office if needed.  Cc: [name]

## 2017-03-21 NOTE — Addendum Note (Signed)
Addended by: Reesa Chew E on: 03/21/2017 12:03 PM   Modules accepted: Orders

## 2017-03-22 ENCOUNTER — Telehealth: Payer: Self-pay | Admitting: Obstetrics and Gynecology

## 2017-03-22 NOTE — Telephone Encounter (Signed)
[facility] specialty pharmacy is calling asking for allergies and ICD-10 for this patient's prolia.

## 2017-03-22 NOTE — Telephone Encounter (Signed)
Spoke with [name] at [facility], provided patient allergies and ICD 10 code. Prolia will be processed with the patient's insurance and she will be contacted to discuss benefits.  Routing to covering provider for final review. Patient agreeable to disposition. Will close encounter.

## 2017-03-23 ENCOUNTER — Encounter: Payer: Self-pay | Admitting: Physical Therapy

## 2017-03-23 ENCOUNTER — Ambulatory Visit: Payer: PPO | Admitting: Physical Therapy

## 2017-03-23 DIAGNOSIS — M25532 Pain in left wrist: Secondary | ICD-10-CM

## 2017-03-23 DIAGNOSIS — M25632 Stiffness of left wrist, not elsewhere classified: Secondary | ICD-10-CM

## 2017-03-23 DIAGNOSIS — M6281 Muscle weakness (generalized): Secondary | ICD-10-CM

## 2017-03-23 NOTE — Therapy (Signed)
[name] [address]  [place], [place], [address] Phone: [phone]   Fax:  [phone]  Physical Therapy Treatment  Patient Details  Name: [name] MRN: [id number] Date of Birth: March 19, 1946 Referring Provider: Avelina Laine, PA-C   Encounter Date: 03/23/2017  PT End of Session - 03/23/17 1017    Visit Number  5    Number of Visits  16    Date for PT Re-Evaluation  04/27/17    Authorization Type  HT Advantage - VL: Medicare guidelines (pt reporting insurance plan will change as of 04/10/17)    PT Start Time  1017    PT Stop Time  1111    PT Time Calculation (min)  54 min    Activity Tolerance  Patient tolerated treatment well    Behavior During Therapy  [facility] for tasks assessed/performed       Past Medical History:  Diagnosis Date  . Chest pain    stress test @ [facility] cardiology (-)  . Colonic polyp    bx adenomatous polyps, next scope 2010  . Fibromyalgia    see rheumatology  . Glaucoma   . Hypothyroidism   . Migraine   . 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 Osteopenia   . Pleuritic chest pain    etiology unclear, has beeb eval by pulmonary and rheumatology (autoimmune dz?)  . Wrist fracture 2018    Past Surgical History:  Procedure Laterality Date  . ESOPHAGOGASTRODUODENOSCOPY (EGD) WITH PROPOFOL N/A 03/27/2013   Procedure: ESOPHAGOGASTRODUODENOSCOPY (EGD) WITH PROPOFOL;  Surgeon: Milus Banister, MD;  Location: [facility];  Service: Endoscopy;  Laterality: N/A;  possible dil  . 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  . plastic surgery face  2013  . right rotator cuff repair  2010  . TONSILLECTOMY      There were no vitals filed for this visit.  Subjective Assessment - 03/23/17 1021    Subjective  Pt reporting her wrist/hand was very flared up following last PT visit and she has had to "baby" her arm since. Also feels that her fibromyalgia is acting up at this Black.    Patient  Stated Goals  "get my arm back the way it was"    Currently in Pain?  Yes    Pain Score  5     Pain Location  Wrist    Pain Orientation  Left    Pain Descriptors / Indicators  Aching;[facility] Adult PT Treatment/Exercise - 03/23/17 1017      Exercises   Exercises  Elbow;Wrist;Hand      Elbow Exercises   Wrist Flexion  Left;15 reps;Seated;Bar weights/barbell;Strengthening    Bar Weights/Barbell (Wrist Flexion)  1 lb    Wrist Flexion Limitations  1#; shoulder extended against gravity     Wrist Extension  Left;Strengthening;15 reps;Seated;Bar weights/barbell    Bar Weights/Barbell (Wrist Extension)  1 lb    Wrist Extension Limitations  1#; shoulder extended against gravity       Shoulder Exercises: ROM/Strengthening   UBE (Upper Arm Bike)  lvl 1.0 fwd/back x 3' each      Hand Exercises   Digiticizer  L hand red (3.0 lb) Digi-Flex x15    Other Hand Exercises  Clothes pin 3 Black pinch placing on horizontal & verical posts - yellow,  red & green      Wrist Exercises   Wrist Radial Deviation  10 reps;Left;Seated    Wrist Radial Deviation Limitations  1#; forearm support on table - lifting against gravity    Wrist Ulnar Deviation  10 reps;Left;Strengthening    Wrist Ulnar Deviation Limitations  1#; shoulder extended against gravity     Other wrist exercises  L wrist flexion, extension & prayer stretches 2x30" each      Modalities   Modalities  Cryotherapy      Cryotherapy   Number Minutes Cryotherapy  10 Minutes    Cryotherapy Location  Wrist;Hand    Type of Cryotherapy  Ice pack      Manual Therapy   Manual Therapy  Soft tissue mobilization;Myofascial release;Joint mobilization    Joint Mobilization  L wrist palmar and dorsal glides, radiocarpal distraction    Soft tissue mobilization  STM to L wrist extensor & flexor groups    Myofascial Release  TPR to L wrist extensor group             PT Education - 03/23/17 1101     Education provided  Yes    Education Details  HEP update - Wrist flexion & extension stretches    Person(s) Educated  Patient    Methods  Explanation;Demonstration;Handout    Comprehension  Verbalized understanding;Returned demonstration          PT Long Term Goals - 03/08/17 1038      PT LONG TERM GOAL #1   Title  Independent with ongoing HEP    Status  On-going      PT LONG TERM GOAL #2   Title  L wrist and forearm AROM w/in 5-10 dg of R w/o limitation due to pain     Status  On-going      PT LONG TERM GOAL #3   Title  L wrist and forearm strength >/= 4/5 and average grip strength w/in 10# of R grip strength    Status  On-going      PT LONG TERM GOAL #4   Title  Pt will perform normal ADLs and light household chores w/o limitaiton due to LOM, weakness or pain in L wrist    Status  On-going      PT LONG TERM GOAL #5   Title  Pt will report ability to resume sewing and beading w/o limitaiton due to LOM, weakness or pain in L wrist    Status  On-going            Plan - 03/23/17 1111    Clinical Impression Statement  Pt reporting increase soreness following last therapy session causing her to have to have to favor her L hand since, but uncertain which of exercises might have triggered the increased pain. Able to alleviate some of the pain with STM and joint mobs, but avoided any significant progression of exercises today due to recent flare-up. Still awaiting MD/PA signature on cert orders therefore unable to trial ionto patch for pain releif today, but office staff reaching out to MD for signatures.    Rehab Potential  Good    Clinical Impairments Affecting Rehab Potential  osteopenia/osteoporosis, fibromyalgia, hypothyroidism, h/o R wrist fracture    PT Treatment/Interventions  Patient/family education;ADLs/Self Care Home Management;Therapeutic exercise;Therapeutic activities;Neuromuscular re-education;Manual techniques;Passive range of motion;Dry  needling;Taping;Electrical Stimulation;Moist Heat;Cryotherapy;Parrafin;Ultrasound;Iontophoresis 4mg /ml Dexamethasone    Consulted and Agree with Plan of Care  Patient       Patient will benefit from skilled therapeutic  intervention in order to improve the following deficits and impairments:  Decreased range of motion, Impaired flexibility, Decreased strength, Pain, Impaired UE functional use  Visit Diagnosis: Stiffness of left wrist, not elsewhere classified  Pain in left wrist  Muscle weakness (generalized)     Problem List Patient Active Problem List   Diagnosis Date Noted  . Chronic back pain 03/15/2017  . PCP NOTES >>>>>>>>>>>>>>>>>>>> 05/24/2015  . Numbness and tingling of right arm 05/22/2015  . Pain in the chest   . Chest pain 05/21/2015  . Insomnia 09/01/2014  . GERD (gastroesophageal reflux disease) 03/27/2013  . Dysphagia, pharyngoesophageal phase 03/27/2013  . Annual physical exam 06/12/2011  . Sinusitis, chronic 02/02/2011  . Osteoporosis 06/08/2010  . Fibromyalgia, pain mngmt 01/29/2009  . LIPOMA 09/30/2008  . Migraine without aura 03/04/2007  . Hypothyroidism 05/15/2006  . COLONIC POLYPS, HX OF 05/15/2006    Percival Spanish, PT, MPT 03/23/2017, 11:51 AM  [facility] [address]  [place], [place], [address] Phone: [phone]   Fax:  [phone]  Name: NIKESHA KWASNY MRN: [id number] Date of Birth: 03/16/1946

## 2017-03-27 ENCOUNTER — Ambulatory Visit: Payer: PPO

## 2017-03-29 ENCOUNTER — Ambulatory Visit: Payer: PPO | Admitting: Physical Therapy

## 2017-03-29 ENCOUNTER — Encounter: Payer: Self-pay | Admitting: Physical Therapy

## 2017-03-29 DIAGNOSIS — M25632 Stiffness of left wrist, not elsewhere classified: Secondary | ICD-10-CM | POA: Diagnosis not present

## 2017-03-29 DIAGNOSIS — M25532 Pain in left wrist: Secondary | ICD-10-CM

## 2017-03-29 DIAGNOSIS — M6281 Muscle weakness (generalized): Secondary | ICD-10-CM

## 2017-03-29 NOTE — Therapy (Signed)
[facility] [address]  [place], [place], [address] Phone: [phone]   Fax:  [phone]  Physical Therapy Treatment  Patient Details  Name: [name] MRN: [id number] Date of Birth: May 20, 1945 Referring Provider: Avelina Laine, PA-C   Encounter Date: 03/29/2017  PT End of Session - 03/29/17 1020    Visit Number  6    Number of Visits  16    Date for PT Re-Evaluation  04/27/17    Authorization Type  [employer] - VL: Medicare guidelines (pt reporting insurance plan will change as of 04/10/17)    PT Start Time  1020    PT Stop Time  1059    PT Time Calculation (min)  39 min    Activity Tolerance  Patient tolerated treatment well    Behavior During Therapy  [facility] for tasks assessed/performed       Past Medical History:  Diagnosis Date  . Chest pain    stress test @ [facility] cardiology (-)  . Colonic polyp    bx adenomatous polyps, next scope 2010  . Fibromyalgia    see rheumatology  . Glaucoma   . Hypothyroidism   . Migraine   . 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 Osteopenia   . Pleuritic chest pain    etiology unclear, has beeb eval by pulmonary and rheumatology (autoimmune dz?)  . Wrist fracture 2018    Past Surgical History:  Procedure Laterality Date  . ESOPHAGOGASTRODUODENOSCOPY (EGD) WITH PROPOFOL N/A 03/27/2013   Procedure: ESOPHAGOGASTRODUODENOSCOPY (EGD) WITH PROPOFOL;  Surgeon: Milus Banister, MD;  Location: [facility];  Service: Endoscopy;  Laterality: N/A;  possible dil  . 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  . plastic surgery face  2013  . right rotator cuff repair  2010  . TONSILLECTOMY      There were no vitals filed for this visit.  Subjective Assessment - 03/29/17 1022    Subjective  "Whatever you did last time really helped with the pain, but I flared it up again sweeping the walkway."    Patient Stated Goals  "get my arm back the way it was"    Currently in Pain?   Yes    Pain Score  3     Pain Location  Wrist                      [facility] Adult PT Treatment/Exercise - 03/29/17 1020      Exercises   Exercises  Elbow;Wrist;Hand      Hand Exercises   Digiticizer  L hand green (5.0 lb) full grip & individual middle 3 digits, red (3.0 lb) for individual 5th digit - Digi-Flex x15    Other Hand Exercises  Clothes pin 3 point pinch placing on horizontal & verical posts - red, green & blue      Wrist Exercises   Wrist Radial Deviation  10 reps;Left;Seated    Wrist Radial Deviation Limitations  2#; forearm support on table - lifting against gravity    Wrist Ulnar Deviation  10 reps;Left;Strengthening    Wrist Ulnar Deviation Limitations  2#; forearm support on table - lifting against gravity    Other wrist exercises  L wrist flexion, extension & prayer stretches 2x30" each      Modalities   Modalities  Iontophoresis      Iontophoresis   Type of Iontophoresis  Dexamethasone    Location  L dorsal wrist    Dose  80 mA-min, 1.0 mL    Time  4-6 patch (#1 of 6)      Manual Therapy   Manual Therapy  Soft tissue mobilization;Myofascial release;Joint mobilization    Joint Mobilization  L wrist palmar and dorsal glides, radiocarpal distraction    Soft tissue mobilization  STM to L wrist extensor & flexor groups    Myofascial Release  TPR to L wrist extensor group             PT Education - 03/29/17 1059    Education provided  Yes    Education Details  Ionto patch instructions    Person(s) Educated  Patient    Methods  Explanation;Handout    Comprehension  Verbalized understanding          PT Long Term Goals - 03/08/17 1038      PT LONG TERM GOAL #1   Title  Independent with ongoing HEP    Status  On-going      PT LONG TERM GOAL #2   Title  L wrist and forearm AROM w/in 5-10 dg of R w/o limitation due to pain     Status  On-going      PT LONG TERM GOAL #3   Title  L wrist and forearm strength >/= 4/5 and average grip  strength w/in 10# of R grip strength    Status  On-going      PT LONG TERM GOAL #4   Title  Pt will perform normal ADLs and light household chores w/o limitaiton due to LOM, weakness or pain in L wrist    Status  On-going      PT LONG TERM GOAL #5   Title  Pt will report ability to resume sewing and beading w/o limitaiton due to LOM, weakness or pain in L wrist    Status  On-going            Plan - 03/29/17 1026    Clinical Impression Statement  [name] reporting pain much better after manual therapy last session, but did note increased pain after she tried sweeping her front walk. Pain now on dorsal aspect of forearm near wrist, and responding well to manual therapy again today. Pt able to tolerate slight progression of resistance with exercises today after manual therapy. Treatment concluded with application of ionto patch to distal dorsal forearm to promote further pain relief and decreased inflammation.    Rehab Potential  Good    Clinical Impairments Affecting Rehab Potential  osteopenia/osteoporosis, fibromyalgia, hypothyroidism, h/o R wrist fracture    PT Treatment/Interventions  Patient/family education;ADLs/Self Care Home Management;Therapeutic exercise;Therapeutic activities;Neuromuscular re-education;Manual techniques;Passive range of motion;Dry needling;Taping;Electrical Stimulation;Moist Heat;Cryotherapy;Parrafin;Ultrasound;Iontophoresis 4mg /ml Dexamethasone    Consulted and Agree with Plan of Care  Patient       Patient will benefit from skilled therapeutic intervention in order to improve the following deficits and impairments:  Decreased range of motion, Impaired flexibility, Decreased strength, Pain, Impaired UE functional use  Visit Diagnosis: Stiffness of left wrist, not elsewhere classified  Pain in left wrist  Muscle weakness (generalized)     Problem List Patient Active Problem List   Diagnosis Date Noted  . Chronic back pain 03/15/2017  . PCP NOTES  >>>>>>>>>>>>>>>>>>>> 05/24/2015  . Numbness and tingling of right arm 05/22/2015  . Pain in the chest   . Chest pain 05/21/2015  . Insomnia 09/01/2014  . GERD (gastroesophageal reflux disease) 03/27/2013  . Dysphagia, pharyngoesophageal phase 03/27/2013  .  Annual physical exam 06/12/2011  . Sinusitis, chronic 02/02/2011  . Osteoporosis 06/08/2010  . Fibromyalgia, pain mngmt 01/29/2009  . LIPOMA 09/30/2008  . Migraine without aura 03/04/2007  . Hypothyroidism 05/15/2006  . COLONIC POLYPS, HX OF 05/15/2006    Percival Spanish, PT, MPT 03/29/2017, 3:09 PM  [facility] [address]  [place], [place], [address] Phone: [phone]   Fax:  [phone]  Name: [name] MRN: [id number] Date of Birth: 17-May-1945

## 2017-03-29 NOTE — Patient Instructions (Signed)

## 2017-04-06 ENCOUNTER — Ambulatory Visit: Payer: PPO

## 2017-04-11 ENCOUNTER — Ambulatory Visit: Payer: Medicare HMO | Attending: Orthopedic Surgery

## 2017-04-11 DIAGNOSIS — M25632 Stiffness of left wrist, not elsewhere classified: Secondary | ICD-10-CM | POA: Insufficient documentation

## 2017-04-11 DIAGNOSIS — M25532 Pain in left wrist: Secondary | ICD-10-CM | POA: Diagnosis present

## 2017-04-11 DIAGNOSIS — M6281 Muscle weakness (generalized): Secondary | ICD-10-CM | POA: Insufficient documentation

## 2017-04-11 NOTE — Therapy (Signed)
[facility] [address]  [place], [place], [address] Phone: [phone]   Fax:  [phone]  Physical Therapy Treatment  Patient Details  Name: [name] MRN: [id number] Date of Birth: July 07, 1945 Referring Provider: Avelina Laine, PA-C   Encounter Date: 04/11/2017  PT End of Session - 04/11/17 1108    Visit Number  7    Number of Visits  16    Date for PT Re-Evaluation  04/27/17    Authorization Type  [employer] - VL: Medicare guidelines (pt reporting insurance plan will change as of 04/10/17)    PT Start Time  1101    PT Stop Time  1150    PT Time Calculation (min)  49 min    Activity Tolerance  Patient tolerated treatment well    Behavior During Therapy  [facility] for tasks assessed/performed       Past Medical History:  Diagnosis Date  . Chest pain    stress test @ [facility] cardiology (-)  . Colonic polyp    bx adenomatous polyps, next scope 2010  . Fibromyalgia    see rheumatology  . Glaucoma   . Hypothyroidism   . Migraine   . 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 Osteopenia   . Pleuritic chest pain    etiology unclear, has beeb eval by pulmonary and rheumatology (autoimmune dz?)  . Wrist fracture 2018    Past Surgical History:  Procedure Laterality Date  . ESOPHAGOGASTRODUODENOSCOPY (EGD) WITH PROPOFOL N/A 03/27/2013   Procedure: ESOPHAGOGASTRODUODENOSCOPY (EGD) WITH PROPOFOL;  Surgeon: Milus Banister, MD;  Location: [facility];  Service: Endoscopy;  Laterality: N/A;  possible dil  . 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  . plastic surgery face  2013  . right rotator cuff repair  2010  . TONSILLECTOMY      There were no vitals filed for this visit.  Subjective Assessment - 04/11/17 1106    Subjective  Reports daily activities and "using wrist" still increase pain.  Does feel like ionto patch helped last visit.      Patient Stated Goals  "get my arm back the way it was"    Currently in  Pain?  Yes    Pain Score  4     Pain Location  Wrist    Pain Orientation  Left    Pain Descriptors / Indicators  Aching;Sharp    Pain Type  Acute pain    Pain Onset  More than a month ago    Pain Frequency  Intermittent    Aggravating Factors   wrist movements particularly twisting door nobs     Pain Relieving Factors  OTC pain meds     Multiple Pain Sites  No         [facility] PT Assessment - 04/11/17 1222      Assessment   Next MD Visit  1.7.19                  [facility] Adult PT Treatment/Exercise - 04/11/17 1123      Elbow Exercises   Wrist Flexion  Left;Seated;Bar weights/barbell;Strengthening;20 reps    Bar Weights/Barbell (Wrist Flexion)  1 lb    Wrist Flexion Limitations  1# forearm supported on table     Wrist Extension  Left;Strengthening;Seated;Bar weights/barbell;20 reps    Wrist Extension Limitations  no resistance due to pain with 1#; forearm supported on table     Other  elbow exercises  --      Hand Exercises   Digiticizer  L hand green (5.0 lb) full grip & individual middle 3 digits, red (3.0 lb) for individual 5th digit - Digi-Flex x 20 reps     Other Hand Exercises  Clothes pin 3 point pinch placing on horizontal & verical posts - red, green & blue    Other Hand Exercises  L 2-5 digit and thumb extension with white rubber band 3" x 10 reps       Wrist Exercises   Wrist Radial Deviation  Left;Seated;15 reps    Wrist Radial Deviation Limitations  2#; forearm support on table - lifting against gravity     Wrist Ulnar Deviation  Left;Strengthening;15 reps    Wrist Ulnar Deviation Limitations  2#; shoulder in extension     Other wrist exercises  L wrist flexion, extension & prayer stretches 2x30" each      Modalities   Modalities  Iontophoresis      Iontophoresis   Type of Iontophoresis  Dexamethasone    Location  L dorsal/ulnar wrist     Dose  80 mA-min, 1.0 mL    Time  4-6 patch (#2 of 6)      Manual Therapy   Manual Therapy  Soft tissue  mobilization;Joint mobilization    Joint Mobilization  radiocarpal distraction with manual extension, flexion ROM     Soft tissue mobilization  STM to L wrist extensor & flexor groups; ttp over distal dorsal and ulnar wrist area                   PT Long Term Goals - 03/08/17 1038      PT LONG TERM GOAL #1   Title  Independent with ongoing HEP    Status  On-going      PT LONG TERM GOAL #2   Title  L wrist and forearm AROM w/in 5-10 dg of R w/o limitation due to pain     Status  On-going      PT LONG TERM GOAL #3   Title  L wrist and forearm strength >/= 4/5 and average grip strength w/in 10# of R grip strength    Status  On-going      PT LONG TERM GOAL #4   Title  Pt will perform normal ADLs and light household chores w/o limitaiton due to LOM, weakness or pain in L wrist    Status  On-going      PT LONG TERM GOAL #5   Title  Pt will report ability to resume sewing and beading w/o limitaiton due to LOM, weakness or pain in L wrist    Status  On-going            Plan - 04/11/17 1143    Clinical Impression Statement  [name] reporting some relief from ionto patch applied last visit however has been feeling L wrist pain with "most" wrist movements and expressing some frustration today regarding persistence of pain.  Tolerated mild progression in repetitions with most gentle forearm strengthening today however unable to perform wrist extension with weight today due to wrist pain.  Still TTP over distal/dorsal wrist along ulnar aspect today.  Ended treatment with application of ionto patch #2/6 over area of most pain at L wrist for hopeful pain relief.  Upcoming MD f/u on 1.7.19 and will plan to assess pt. for this f/u at next visit.    Clinical Impairments Affecting Rehab Potential  osteopenia/osteoporosis,  fibromyalgia, hypothyroidism, h/o R wrist fracture    PT Treatment/Interventions  Patient/family education;ADLs/Self Care Home Management;Therapeutic  exercise;Therapeutic activities;Neuromuscular re-education;Manual techniques;Passive range of motion;Dry needling;Taping;Electrical Stimulation;Moist Heat;Cryotherapy;Parrafin;Ultrasound;Iontophoresis 4mg /ml Dexamethasone    PT Next Visit Plan  MD f/u 1.7.19    Consulted and Agree with Plan of Care  Patient       Patient will benefit from skilled therapeutic intervention in order to improve the following deficits and impairments:  Decreased range of motion, Impaired flexibility, Decreased strength, Pain, Impaired UE functional use  Visit Diagnosis: Stiffness of left wrist, not elsewhere classified  Pain in left wrist  Muscle weakness (generalized)     Problem List Patient Active Problem List   Diagnosis Date Noted  . Chronic back pain 03/15/2017  . PCP NOTES >>>>>>>>>>>>>>>>>>>> 05/24/2015  . Numbness and tingling of right arm 05/22/2015  . Pain in the chest   . Chest pain 05/21/2015  . Insomnia 09/01/2014  . GERD (gastroesophageal reflux disease) 03/27/2013  . Dysphagia, pharyngoesophageal phase 03/27/2013  . Annual physical exam 06/12/2011  . Sinusitis, chronic 02/02/2011  . Osteoporosis 06/08/2010  . Fibromyalgia, pain mngmt 01/29/2009  . LIPOMA 09/30/2008  . Migraine without aura 03/04/2007  . Hypothyroidism 05/15/2006  . COLONIC POLYPS, HX OF 05/15/2006    Bess Harvest, PTA 04/11/17 12:23 PM   [facility] [address]  [place], [place], [address] Phone: [phone]   Fax:  [phone]  Name: [name] MRN: [id number] Date of Birth: 09/28/1945

## 2017-04-13 ENCOUNTER — Ambulatory Visit: Payer: Medicare HMO

## 2017-04-13 DIAGNOSIS — M25532 Pain in left wrist: Secondary | ICD-10-CM

## 2017-04-13 DIAGNOSIS — M25632 Stiffness of left wrist, not elsewhere classified: Secondary | ICD-10-CM

## 2017-04-13 DIAGNOSIS — M6281 Muscle weakness (generalized): Secondary | ICD-10-CM | POA: Diagnosis not present

## 2017-04-13 NOTE — Therapy (Addendum)
[facility] [address]  [place], [place], [address] Phone: [phone]   Fax:  [phone]  Physical Therapy Treatment  Patient Details  Name: [name] MRN: [id number] Date of Birth: 19-Aug-1945 Referring Provider: Avelina Laine, PA-C   Encounter Date: 04/13/2017  PT End of Session - 04/13/17 1108    Visit Number  8    Number of Visits  16    Date for PT Re-Evaluation  04/27/17    Authorization Type  [employer] - VL: Medicare guidelines (pt reporting insurance plan will change as of 04/10/17)    PT Start Time  1102    PT Stop Time  1146    PT Time Calculation (min)  44 min    Activity Tolerance  Patient tolerated treatment well    Behavior During Therapy  [facility] for tasks assessed/performed       Past Medical History:  Diagnosis Date  . Chest pain    stress test @ [facility] cardiology (-)  . Colonic polyp    bx adenomatous polyps, next scope 2010  . Fibromyalgia    see rheumatology  . Glaucoma   . Hypothyroidism   . Migraine   . 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 Osteopenia   . Pleuritic chest pain    etiology unclear, has beeb eval by pulmonary and rheumatology (autoimmune dz?)  . Wrist fracture 2018    Past Surgical History:  Procedure Laterality Date  . ESOPHAGOGASTRODUODENOSCOPY (EGD) WITH PROPOFOL N/A 03/27/2013   Procedure: ESOPHAGOGASTRODUODENOSCOPY (EGD) WITH PROPOFOL;  Surgeon: Milus Banister, MD;  Location: [facility];  Service: Endoscopy;  Laterality: N/A;  possible dil  . 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  . plastic surgery face  2013  . right rotator cuff repair  2010  . TONSILLECTOMY      There were no vitals filed for this visit.  Subjective Assessment - 04/13/17 1106    Subjective  Pt. reporting more benefit from ionto patch 1#/6 previously applied as compared to ionto patch this past session.      Patient Stated Goals  "get my arm back the way it was"    Currently  in Pain?  Yes    Pain Score  2     Pain Location  Wrist    Pain Orientation  Left    Pain Descriptors / Indicators  Aching    Pain Type  Acute pain    Pain Onset  More than a month ago    Pain Frequency  Intermittent    Aggravating Factors   opening door nobs, and movement in general     Pain Relieving Factors  OTC pain meds     Multiple Pain Sites  No         [facility] PT Assessment - 04/13/17 1117      AROM   AROM Assessment Site  Wrist;Forearm;Finger    Right/Left Forearm  Right;Left    Right Forearm Pronation  96 Degrees    Right Forearm Supination  91 Degrees    Left Forearm Pronation  81 Degrees    Left Forearm Supination  83 Degrees    Right Wrist Extension  65 Degrees    Right Wrist Flexion  59 Degrees    Right Wrist Radial Deviation  30 Degrees    Right Wrist Ulnar Deviation  43 Degrees    Left Wrist Extension  52 Degrees  Left Wrist Flexion  56 Degrees    Left Wrist Radial Deviation  40 Degrees    Left Wrist Ulnar Deviation  29 Degrees      Strength   Strength Assessment Site  Elbow;Forearm;Wrist    Right/Left Elbow  Right;Left    Right Elbow Flexion  4+/5    Right Elbow Extension  4+/5    Left Elbow Flexion  4+/5    Left Elbow Extension  4+/5    Right/Left Forearm  Right;Left    Right Forearm Pronation  4+/5    Right Forearm Supination  4+/5    Left Forearm Pronation  4/5    Left Forearm Supination  3+/5    Right/Left Wrist  Right;Left    Right Wrist Flexion  4+/5    Right Wrist Extension  4+/5    Right Wrist Radial Deviation  4+/5    Right Wrist Ulnar Deviation  4+/5    Left Wrist Flexion  4-/5    Left Wrist Extension  4/5    Left Wrist Radial Deviation  4/5    Left Wrist Ulnar Deviation  4-/5    Right/Left hand  Right;Left    Right Hand Grip (lbs)  23, 25, 24 23, 25, 24    Right Hand Lateral Pinch  14 lbs    Right Hand 3 Point Pinch  10 lbs    Left Hand Gross Grasp  Functional    Left Hand Grip (lbs)  <5#    Left Hand Lateral Pinch  8 lbs     Left Hand 3 Point Pinch  6 lbs                  [facility] Adult PT Treatment/Exercise - 04/13/17 1112      Elbow Exercises   Wrist Flexion  Left;Seated;Bar weights/barbell;Strengthening;20 reps    Bar Weights/Barbell (Wrist Flexion)  Other (comment) no resistance due to report of pain resistance     Wrist Flexion Limitations  supported on table     Wrist Extension  Left;Strengthening;Seated;Bar weights/barbell;20 IT trainer (Wrist Extension)  Other (comment) no resistance due to pain with resistance     Wrist Extension Limitations  supported on table       Hand Exercises   Digiticizer  L hand green (5.0 lb) full grip & individual middle 3 digits, red (3.0 lb) for individual 5th digit - Digi-Flex x 20 reps  Reports improved tolerance for "pinky" squeeze     Other Hand Exercises  marble pickup and put in cup x 15 reps; thumb and 2nd ditgit pinch, thumb and 3rd digit pinch, thumb and 4th digit pinch x 15 marbles each       Wrist Exercises   Wrist Radial Deviation  Left;Seated;15 reps    Wrist Radial Deviation Limitations  2#; forearm supported on table - lifting against gravity     Wrist Ulnar Deviation  Left;Strengthening;15 reps    Wrist Ulnar Deviation Limitations  2#; shoulder in extension - against gravity    Other wrist exercises  L wrist flexion, extension & prayer stretches 2x30" each      Iontophoresis   Type of Iontophoresis  Dexamethasone    Location  L dorsal wrist over area of most tenderness     Dose  80 mA-min, 1.0 mL    Time  4-6 patch (#3 of 6)      Manual Therapy   Manual Therapy  Soft tissue mobilization;Joint mobilization    Joint Mobilization  radiocarpal distraction with manual extension, flexion gentle ROM     Soft tissue mobilization  STM to L wrist extensor & flexor groups; ttp over distal dorsal and ulnar wrist area                   PT Long Term Goals - 04/13/17 1110      PT LONG TERM GOAL #1   Title  Independent with  ongoing HEP    Status  On-going      PT LONG TERM GOAL #2   Title  L wrist and forearm AROM w/in 5-10 dg of R w/o limitation due to pain     Status  On-going      PT LONG TERM GOAL #3   Title  L wrist and forearm strength >/= 4/5 and average grip strength w/in 10# of R grip strength    Status  Partially Met      PT LONG TERM GOAL #4   Title  Pt will perform normal ADLs and light household chores w/o limitaiton due to LOM, weakness or pain in L wrist    Status  On-going Reports limited by L wrist due to weakness and pain with household activities       PT LONG TERM GOAL #5   Title  Pt will report ability to resume sewing and beading w/o limitaiton due to LOM, weakness or pain in L wrist    Status  On-going Has not yet attempted however reports this is due to "needing to get my eyes checked"            Plan - 04/13/17 1109    Clinical Impression Statement  [name] making good progress with therapy thus far showing some strength and AROM improvement in L UE.  Greatest complaint is consistent pain at L dorsal wrist with most movements however seems to getting some relief from iontophoresis thus far in this area.  L grip strength continues to be pt. greatest deficit <5# today.  Pt. does still feel limited by L wrist pain and weakness with daily activities and household chores and has not yet attempted "beading" and sowing at this time.  [name] had some increased L wrist pain today with resisted wrist flexion and extension strengthening activities thus performed without resistance.  Pt. will continue to benefit from further skilled therapy to maximize functional strength and ROM.       Clinical Impairments Affecting Rehab Potential  osteopenia/osteoporosis, fibromyalgia, hypothyroidism, h/o R wrist fracture    PT Treatment/Interventions  Patient/family education;ADLs/Self Care Home Management;Therapeutic exercise;Therapeutic activities;Neuromuscular re-education;Manual techniques;Passive range of  motion;Dry needling;Taping;Electrical Stimulation;Moist Heat;Cryotherapy;Parrafin;Ultrasound;Iontophoresis 74m/ml Dexamethasone    Consulted and Agree with Plan of Care  Patient       Patient will benefit from skilled therapeutic intervention in order to improve the following deficits and impairments:  Decreased range of motion, Impaired flexibility, Decreased strength, Pain, Impaired UE functional use  Visit Diagnosis: Stiffness of left wrist, not elsewhere classified  Pain in left wrist  Muscle weakness (generalized)     Problem List Patient Active Problem List   Diagnosis Date Noted  . Chronic back pain 03/15/2017  . PCP NOTES >>>>>>>>>>>>>>>>>>>> 05/24/2015  . Numbness and tingling of right arm 05/22/2015  . Pain in the chest   . Chest pain 05/21/2015  . Insomnia 09/01/2014  . GERD (gastroesophageal reflux disease) 03/27/2013  . Dysphagia, pharyngoesophageal phase 03/27/2013  . Annual physical exam 06/12/2011  . Sinusitis, chronic 02/02/2011  . Osteoporosis 06/08/2010  .  Fibromyalgia, pain mngmt 01/29/2009  . LIPOMA 09/30/2008  . Migraine without aura 03/04/2007  . Hypothyroidism 05/15/2006  . COLONIC POLYPS, HX OF 05/15/2006     Bess Harvest, PTA 04/13/17 12:34 PM  [facility] [address]  [place], [place], [address] Phone: [phone]   Fax:  [phone]  Name: CIRA DEYOE MRN: [id number] Date of Birth: [birth year]  PHYSICAL THERAPY DISCHARGE SUMMARY  Visits from Start of Care: 8  Current functional level related to goals / functional outcomes:   Refer to above clinical impression for status as of last visit on 04/13/17. Pt has not returned in >30 days, therefore will proceed with discharge from PT for this episode.   Remaining deficits:   As above.   Education / Equipment:   HEP  Plan: Patient agrees to discharge.  Patient goals were partially met. Patient is being discharged due to  not returning since the last visit.  ?????        Percival Spanish, PT, MPT 05/21/17, 12:53 PM  [facility] [address]  [place], [place], [address] Phone: [phone]   Fax:  [phone]

## 2017-04-16 DIAGNOSIS — S52502D Unspecified fracture of the lower end of left radius, subsequent encounter for closed fracture with routine healing: Secondary | ICD-10-CM | POA: Diagnosis not present

## 2017-04-16 DIAGNOSIS — S52509A Unspecified fracture of the lower end of unspecified radius, initial encounter for closed fracture: Secondary | ICD-10-CM | POA: Insufficient documentation

## 2017-04-18 ENCOUNTER — Other Ambulatory Visit: Payer: Self-pay

## 2017-04-18 DIAGNOSIS — M549 Dorsalgia, unspecified: Principal | ICD-10-CM

## 2017-04-18 DIAGNOSIS — Z79899 Other long term (current) drug therapy: Secondary | ICD-10-CM

## 2017-04-18 DIAGNOSIS — G8929 Other chronic pain: Secondary | ICD-10-CM

## 2017-04-23 ENCOUNTER — Ambulatory Visit (AMBULATORY_SURGERY_CENTER): Payer: Self-pay | Admitting: *Deleted

## 2017-04-23 ENCOUNTER — Other Ambulatory Visit: Payer: Self-pay

## 2017-04-23 VITALS — Ht 65.5 in | Wt 180.4 lb

## 2017-04-23 DIAGNOSIS — Z86010 Personal history of colonic polyps: Secondary | ICD-10-CM

## 2017-04-23 MED ORDER — NA SULFATE-K SULFATE-MG SULF 17.5-3.13-1.6 GM/177ML PO SOLN: 1.0000 [IU] | Freq: Once | ORAL | 0 refills | Status: AC

## 2017-04-23 NOTE — Progress Notes (Signed)
No egg or soy allergy known to patient  No issues with past sedation with any surgeries  or procedures, no intubation problems  No diet pills per patient No home 02 use per patient  No blood thinners per patient  Pt denies issues with constipation  No A fib or A flutter  EMMI video sent to pt's e mail  

## 2017-04-24 ENCOUNTER — Telehealth: Payer: Self-pay

## 2017-04-24 DIAGNOSIS — H04123 Dry eye syndrome of bilateral lacrimal glands: Secondary | ICD-10-CM | POA: Diagnosis not present

## 2017-04-24 DIAGNOSIS — H2513 Age-related nuclear cataract, bilateral: Secondary | ICD-10-CM | POA: Diagnosis not present

## 2017-04-24 DIAGNOSIS — H43393 Other vitreous opacities, bilateral: Secondary | ICD-10-CM | POA: Diagnosis not present

## 2017-04-24 DIAGNOSIS — L908 Other atrophic disorders of skin: Secondary | ICD-10-CM | POA: Diagnosis not present

## 2017-04-24 NOTE — Telephone Encounter (Signed)
Spoke with [name] at [facility]. Delivery of Prolia scheduled for 04/26/2017.  Left message to call [place] at [phone]. Need to schedule appointment for Prolia injection.

## 2017-04-24 NOTE — Telephone Encounter (Signed)
Patient returned call to [facility] and scheduled her Prolia injection for 04/27/17.

## 2017-04-24 NOTE — Telephone Encounter (Signed)
Routing to Dr.Jertson as covering as Dr.Silva is out of office. Encounter closed.

## 2017-04-25 ENCOUNTER — Encounter: Payer: Self-pay | Admitting: Gastroenterology

## 2017-04-26 ENCOUNTER — Other Ambulatory Visit (INDEPENDENT_AMBULATORY_CARE_PROVIDER_SITE_OTHER): Payer: Medicare HMO

## 2017-04-26 DIAGNOSIS — E039 Hypothyroidism, unspecified: Secondary | ICD-10-CM

## 2017-04-26 DIAGNOSIS — M549 Dorsalgia, unspecified: Secondary | ICD-10-CM | POA: Diagnosis not present

## 2017-04-26 DIAGNOSIS — Z79899 Other long term (current) drug therapy: Secondary | ICD-10-CM | POA: Diagnosis not present

## 2017-04-26 DIAGNOSIS — G8929 Other chronic pain: Secondary | ICD-10-CM | POA: Diagnosis not present

## 2017-04-26 LAB — BASIC METABOLIC PANEL
BUN: 11 mg/dL (ref 6–23)
CO2: 25 mEq/L (ref 19–32)
CREATININE: 0.82 mg/dL (ref 0.40–1.20)
Calcium: 9.1 mg/dL (ref 8.4–10.5)
Chloride: 103 mEq/L (ref 96–112)
GFR: 73.01 mL/min (ref >60.00)
GLUCOSE: 90 mg/dL (ref 70–99)
Potassium: 3.7 mEq/L (ref 3.5–5.1)
Sodium: 138 mEq/L (ref 135–145)

## 2017-04-26 LAB — TSH: TSH: 0.7 u[IU]/mL (ref 0.35–4.50)

## 2017-04-27 ENCOUNTER — Encounter: Payer: Self-pay | Admitting: Gastroenterology

## 2017-04-27 ENCOUNTER — Other Ambulatory Visit: Payer: Self-pay

## 2017-04-27 ENCOUNTER — Ambulatory Visit (INDEPENDENT_AMBULATORY_CARE_PROVIDER_SITE_OTHER): Payer: Medicare HMO

## 2017-04-27 VITALS — BP 128/72 | HR 68 | Resp 16 | Ht 65.0 in | Wt 181.0 lb

## 2017-04-27 DIAGNOSIS — M8580 Other specified disorders of bone density and structure, unspecified site: Secondary | ICD-10-CM | POA: Diagnosis not present

## 2017-04-27 MED ORDER — DENOSUMAB 60 MG/ML ~~LOC~~ SOLN
60.0000 mg | Freq: Once | SUBCUTANEOUS | Status: AC
  Administered 2017-04-27: 60 mg via SUBCUTANEOUS

## 2017-04-27 NOTE — Progress Notes (Signed)
Patient in today for first Prolia injection. Patient's initial calcium level was obtained on 9.1.  Result: 04/26/17.  Last AEX: 01/03/17 BS Last BMD: 02/15/17  Injection given in left arm.  Patient tolerated injection well.  Routed to provider for review.

## 2017-05-01 LAB — PAIN MGMT, PROFILE 8 W/CONF, U
6 ACETYLMORPHINE: NEGATIVE ng/mL (ref <10)
ALPHAHYDROXYTRIAZOLAM: NEGATIVE ng/mL (ref <50)
AMPHETAMINES: NEGATIVE ng/mL (ref <500)
Alcohol Metabolites: NEGATIVE ng/mL (ref <500)
Alphahydroxyalprazolam: 41 ng/mL — ABNORMAL HIGH (ref <25)
Alphahydroxymidazolam: NEGATIVE ng/mL (ref <50)
Aminoclonazepam: NEGATIVE ng/mL (ref <25)
BENZODIAZEPINES: POSITIVE ng/mL — AB (ref <100)
Buprenorphine, Urine: NEGATIVE ng/mL (ref <5)
COCAINE METABOLITE: NEGATIVE ng/mL (ref <150)
CODEINE: NEGATIVE ng/mL (ref <50)
CREATININE: 27.6 mg/dL
Hydrocodone: 154 ng/mL — ABNORMAL HIGH (ref <50)
Hydromorphone: 124 ng/mL — ABNORMAL HIGH (ref <50)
Hydroxyethylflurazepam: NEGATIVE ng/mL (ref <50)
LORAZEPAM: NEGATIVE ng/mL (ref <50)
MDMA: NEGATIVE ng/mL (ref <500)
Marijuana Metabolite: NEGATIVE ng/mL (ref <20)
Morphine: NEGATIVE ng/mL (ref <50)
NORHYDROCODONE: 381 ng/mL — AB (ref <50)
Nordiazepam: NEGATIVE ng/mL (ref <50)
OPIATES: POSITIVE ng/mL — AB (ref <100)
OXAZEPAM: NEGATIVE ng/mL (ref <50)
OXYCODONE: NEGATIVE ng/mL (ref <100)
Oxidant: NEGATIVE ug/mL (ref <200)
Temazepam: NEGATIVE ng/mL (ref <50)
pH: 6.67 (ref 4.5–9.0)

## 2017-05-04 ENCOUNTER — Other Ambulatory Visit: Payer: Self-pay

## 2017-05-04 ENCOUNTER — Ambulatory Visit (AMBULATORY_SURGERY_CENTER): Payer: Medicare HMO | Admitting: Gastroenterology

## 2017-05-04 ENCOUNTER — Encounter: Payer: Self-pay | Admitting: Gastroenterology

## 2017-05-04 VITALS — BP 110/67 | HR 65 | Temp 97.1°F | Resp 9 | Ht 65.5 in | Wt 180.0 lb

## 2017-05-04 DIAGNOSIS — Z1211 Encounter for screening for malignant neoplasm of colon: Secondary | ICD-10-CM | POA: Diagnosis not present

## 2017-05-04 DIAGNOSIS — D123 Benign neoplasm of transverse colon: Secondary | ICD-10-CM

## 2017-05-04 DIAGNOSIS — D120 Benign neoplasm of cecum: Secondary | ICD-10-CM | POA: Diagnosis not present

## 2017-05-04 DIAGNOSIS — D122 Benign neoplasm of ascending colon: Secondary | ICD-10-CM

## 2017-05-04 DIAGNOSIS — D126 Benign neoplasm of colon, unspecified: Secondary | ICD-10-CM | POA: Diagnosis not present

## 2017-05-04 DIAGNOSIS — Z86010 Personal history of colonic polyps: Secondary | ICD-10-CM | POA: Diagnosis not present

## 2017-05-04 MED ORDER — SODIUM CHLORIDE 0.9 % IV SOLN: 500.0000 mL | INTRAVENOUS | Status: DC

## 2017-05-04 NOTE — Patient Instructions (Signed)
Handout given on polyps  YOU HAD AN ENDOSCOPIC PROCEDURE TODAY: Refer to the procedure report and other information in the discharge instructions given to you for any specific questions about what was found during the examination. If this information does not answer your questions, please call [place] office at [phone] to clarify.   YOU SHOULD EXPECT: Some feelings of bloating in the abdomen. Passage of more gas than usual. Walking can help get rid of the air that was put into your GI tract during the procedure and reduce the bloating. If you had a lower endoscopy (such as a colonoscopy or flexible sigmoidoscopy) you may notice spotting of blood in your stool or on the toilet paper. Some abdominal soreness may be present for a day or two, also.  DIET: Your first meal following the procedure should be a light meal and then it is ok to progress to your normal diet. A half-sandwich or bowl of soup is an example of a good first meal. Heavy or fried foods are harder to digest and may make you feel nauseous or bloated. Drink plenty of fluids but you should avoid alcoholic beverages for 24 hours. If you had a esophageal dilation, please see attached instructions for diet.    ACTIVITY: Your care partner should take you home directly after the procedure. You should plan to take it easy, moving slowly for the rest of the day. You can resume normal activity the day after the procedure however YOU SHOULD NOT DRIVE, use power tools, machinery or perform tasks that involve climbing or major physical exertion for 24 hours (because of the sedation medicines used during the test).   SYMPTOMS TO REPORT IMMEDIATELY: A gastroenterologist can be reached at any hour. Please call [phone]  for any of the following symptoms:  Following lower endoscopy (colonoscopy, flexible sigmoidoscopy) Excessive amounts of blood in the stool  Significant tenderness, worsening of abdominal pains  Swelling of the abdomen that is  new, acute  Fever of 100 or higher    FOLLOW UP:  If any biopsies were taken you will be contacted by phone or by letter within the next 1-3 weeks. Call [phone]  if you have not heard about the biopsies in 3 weeks.  Please also call with any specific questions about appointments or follow up tests.  

## 2017-05-04 NOTE — Op Note (Signed)
[facility] Patient Name: [name] Procedure Date: 05/04/2017 8:55 AM MRN: [id number] Endoscopist: Milus Banister , MD Age: 72 Referring MD:  Date of Birth: [birth year] Gender: Female Account #: [id number] Procedure:                Colonoscopy Indications:              High risk colon cancer surveillance: Personal                            history of colonic polyps; 2010 colonoscopy 5 subCM                            adenomas; Repeat colonoscopy 04/2012 three small                            polyps, two were adenomas; recommended recall at 5                            year interval. Medicines:                Monitored Anesthesia Care Procedure:                Pre-Anesthesia Assessment:                           - Prior to the procedure, a History and Physical                            was performed, and patient medications and                            allergies were reviewed. The patient's tolerance of                            previous anesthesia was also reviewed. The risks                            and benefits of the procedure and the sedation                            options and risks were discussed with the patient.                            All questions were answered, and informed consent                            was obtained. Prior Anticoagulants: The patient has                            taken no previous anticoagulant or antiplatelet                            agents. ASA Grade Assessment: II - A patient with  mild systemic disease. After reviewing the risks                            and benefits, the patient was deemed in                            satisfactory condition to undergo the procedure.                           After obtaining informed consent, the colonoscope                            was passed under direct vision. Throughout the                            procedure, the patient's blood pressure, pulse, and                             oxygen saturations were monitored continuously. The                            Colonoscope was introduced through the anus and                            advanced to the the cecum, identified by                            appendiceal orifice and ileocecal valve. The                            colonoscopy was performed without difficulty. The                            patient tolerated the procedure well. The quality                            of the bowel preparation was good. The ileocecal                            valve, appendiceal orifice, and rectum were                            photographed. Scope In: 9:07:26 AM Scope Out: 9:23:06 AM Scope Withdrawal Time: 0 hours 11 minutes 4 seconds  Total Procedure Duration: 0 hours 15 minutes 40 seconds  Findings:                 Five sessile polyps were found in the transverse                            colon, ascending colon and cecum. The polyps were 2                            to 4 mm in size. These polyps were removed with a  cold snare. Resection and retrieval were complete.                           The exam was otherwise without abnormality on                            direct and retroflexion views. Complications:            No immediate complications. Estimated blood loss:                            None. Estimated Blood Loss:     Estimated blood loss: none. Impression:               - Five 2 to 4 mm polyps in the transverse colon, in                            the ascending colon and in the cecum, removed with                            a cold snare. Resected and retrieved.                           - The examination was otherwise normal on direct                            and retroflexion views. Recommendation:           - Patient has a contact number available for                            emergencies. The signs and symptoms of potential                            delayed  complications were discussed with the                            patient. Return to normal activities tomorrow.                            Written discharge instructions were provided to the                            patient.                           - Resume previous diet.                           - Continue present medications.                           You will receive a letter within 2-3 weeks with the                            pathology results and my final recommendations.  If the polyp(s) is proven to be 'pre-cancerous' on                            pathology, you will need repeat colonoscopy in 3-5                            years. Milus Banister, MD 05/04/2017 9:27:13 AM This report has been signed electronically.

## 2017-05-04 NOTE — Progress Notes (Signed)
To PACU, VSS. Report to [email or web address] 

## 2017-05-04 NOTE — Progress Notes (Signed)
Called to room to assist during endoscopic procedure.  Patient ID and intended procedure confirmed with present staff. Received instructions for my participation in the procedure from the performing physician.  

## 2017-05-07 ENCOUNTER — Telehealth: Payer: Self-pay

## 2017-05-07 NOTE — Telephone Encounter (Signed)
  Follow up Call-  Call back number 05/04/2017  Post procedure Call Back phone  # [phone]  Permission to leave phone message Yes  Some recent data might be hidden     Patient questions:  Do you have a fever, pain , or abdominal swelling? No. Pain Score  0 *  Have you tolerated food without any problems? Yes.    Have you been able to return to your normal activities? Yes.    Do you have any questions about your discharge instructions: Diet   No. Medications  No. Follow up visit  No.  Do you have questions or concerns about your Care? No.  Actions: * If pain score is 4 or above: No action needed, pain <4.

## 2017-05-16 ENCOUNTER — Other Ambulatory Visit: Payer: Self-pay | Admitting: Internal Medicine

## 2017-05-17 ENCOUNTER — Other Ambulatory Visit: Payer: Self-pay | Admitting: Internal Medicine

## 2017-05-17 NOTE — Telephone Encounter (Signed)
Requesting: hydrocodone Contract: 09/01/14 needs updated contract UDS: 04/26/17 Last Visit: 03/15/17 Next Visit: 09/17/17 Last Refill: 02/16/17  Please Advise

## 2017-05-18 ENCOUNTER — Telehealth: Payer: Self-pay | Admitting: Internal Medicine

## 2017-05-18 ENCOUNTER — Encounter: Payer: Self-pay | Admitting: Gastroenterology

## 2017-05-18 ENCOUNTER — Other Ambulatory Visit: Payer: Self-pay | Admitting: *Deleted

## 2017-05-18 MED ORDER — LEVOTHYROXINE SODIUM 88 MCG PO TABS: 88.0000 ug | ORAL_TABLET | Freq: Every day | ORAL | 1 refills | Status: DC

## 2017-05-18 MED ORDER — HYDROCODONE-ACETAMINOPHEN 5-325 MG PO TABS: 1.0000 | ORAL_TABLET | Freq: Four times a day (QID) | ORAL | 0 refills | Status: DC | PRN

## 2017-05-18 NOTE — Telephone Encounter (Signed)
See 05/16/17 refill note regarding hydrocodone refill. Sent refill of levothyroxine to pharmacy.

## 2017-05-18 NOTE — Telephone Encounter (Signed)
[name]-- [facility] [address] that [facility] have been out of medication and still do not have it in. Can [facility] [address] per pt's request below?     Priority: High Created on: 05/18/2017 02:30 PM By: Yvette Rack  Primary Information   Source Subject Topic  [name] "[name]" (Patient) [name] "[name]" (Patient) General - Other  Summary: please resend medicine to new location  Reason for CRM: patient calling because [id number] [place], [address] has been out of the HYDROcodone-acetaminophen (NORCO/VICODIN) 5-325 MG tablet  For 2 weeks but the [facility] [address] [phone] [address] has this medicine please send RX of HYDROcodone-acetaminophen (NORCO/VICODIN) 5-325 MG tablet over there

## 2017-05-18 NOTE — Telephone Encounter (Signed)
I will forward to PCP

## 2017-05-18 NOTE — Telephone Encounter (Signed)
Copied from [place]. Topic: Quick Communication - See Telephone Encounter >> May 18, 2017 10:31 AM Boyd Kerbs wrote: CRM for notification. See Telephone encounter for:   Pt. Calling about prescriptions requested and if can get these today,  HYDROcodone-acetaminophen (NORCO/VICODIN) 5-325 MG,  Pt requested on mychart.   levothyroxine (SYNTHROID, LEVOTHROID) 88 MCG tablet  [facility].   [id number] [address], [place] [address] [place] [address] Phone: [phone] Fax: [phone]   05/18/17.

## 2017-05-18 NOTE — Telephone Encounter (Signed)
Please call the patient, let her know I sent a prescription, also we do need a new contract,  please leave one in the front and  ask patient to come and sign it at her earliest convenience

## 2017-05-18 NOTE — Telephone Encounter (Signed)
Rx request for provider review:  Vicodin 5-325 mg Last filled - 02/16/17  Synthroid 88 mcg- pended at office (TSH- 04/26/17)  LOV: 03/15/17   Pharmacy: verified

## 2017-05-19 ENCOUNTER — Encounter: Payer: Self-pay | Admitting: Internal Medicine

## 2017-05-19 MED ORDER — HYDROCODONE-ACETAMINOPHEN 5-325 MG PO TABS: 1.0000 | ORAL_TABLET | Freq: Four times a day (QID) | ORAL | 0 refills | Status: DC | PRN

## 2017-05-19 NOTE — Telephone Encounter (Signed)
rx sent , will let pt know

## 2017-05-25 DIAGNOSIS — H43393 Other vitreous opacities, bilateral: Secondary | ICD-10-CM | POA: Diagnosis not present

## 2017-06-13 DIAGNOSIS — H02834 Dermatochalasis of left upper eyelid: Secondary | ICD-10-CM | POA: Diagnosis not present

## 2017-06-13 DIAGNOSIS — H02831 Dermatochalasis of right upper eyelid: Secondary | ICD-10-CM | POA: Diagnosis not present

## 2017-07-08 ENCOUNTER — Telehealth: Payer: Self-pay | Admitting: Internal Medicine

## 2017-07-09 NOTE — Telephone Encounter (Signed)
Sent!

## 2017-07-09 NOTE — Telephone Encounter (Signed)
Pt requesting refill on alprazolam 0.5mg .   Last OV: 03/15/2017 Last Fill: 09/13/2016 #30 and 5RF UDS: 04/26/2017 Low risk  NCCR printed- no discrepancies noted- sent for scanning  Please advise.

## 2017-07-18 DIAGNOSIS — H02831 Dermatochalasis of right upper eyelid: Secondary | ICD-10-CM | POA: Diagnosis not present

## 2017-07-18 DIAGNOSIS — H02834 Dermatochalasis of left upper eyelid: Secondary | ICD-10-CM | POA: Diagnosis not present

## 2017-08-02 DIAGNOSIS — R69 Illness, unspecified: Secondary | ICD-10-CM | POA: Diagnosis not present

## 2017-08-15 DIAGNOSIS — H02831 Dermatochalasis of right upper eyelid: Secondary | ICD-10-CM | POA: Diagnosis not present

## 2017-08-15 DIAGNOSIS — H02834 Dermatochalasis of left upper eyelid: Secondary | ICD-10-CM | POA: Diagnosis not present

## 2017-09-05 DIAGNOSIS — S52592D Other fractures of lower end of left radius, subsequent encounter for closed fracture with routine healing: Secondary | ICD-10-CM | POA: Diagnosis not present

## 2017-09-17 ENCOUNTER — Encounter: Payer: Self-pay | Admitting: Internal Medicine

## 2017-09-17 ENCOUNTER — Ambulatory Visit (INDEPENDENT_AMBULATORY_CARE_PROVIDER_SITE_OTHER): Payer: Medicare HMO | Admitting: Internal Medicine

## 2017-09-17 VITALS — BP 130/82 | HR 68 | Temp 98.3°F | Resp 16 | Ht 66.0 in | Wt 178.0 lb

## 2017-09-17 DIAGNOSIS — Z0000 Encounter for general adult medical examination without abnormal findings: Secondary | ICD-10-CM

## 2017-09-17 LAB — COMPREHENSIVE METABOLIC PANEL
ALBUMIN: 4 g/dL (ref 3.5–5.2)
ALT: 10 U/L (ref 0–35)
AST: 13 U/L (ref 0–37)
Alkaline Phosphatase: 52 U/L (ref 39–117)
BILIRUBIN TOTAL: 0.5 mg/dL (ref 0.2–1.2)
BUN: 15 mg/dL (ref 6–23)
CO2: 30 mEq/L (ref 19–32)
Calcium: 9.2 mg/dL (ref 8.4–10.5)
Chloride: 106 mEq/L (ref 96–112)
Creatinine, Ser: 0.78 mg/dL (ref 0.40–1.20)
GFR: 77.26 mL/min (ref >60.00)
GLUCOSE: 81 mg/dL (ref 70–99)
Potassium: 4 mEq/L (ref 3.5–5.1)
SODIUM: 143 meq/L (ref 135–145)
TOTAL PROTEIN: 6.4 g/dL (ref 6.0–8.3)

## 2017-09-17 LAB — CBC
HEMATOCRIT: 39.4 % (ref 36.0–46.0)
HEMOGLOBIN: 13.3 g/dL (ref 12.0–15.0)
MCHC: 33.7 g/dL (ref 30.0–36.0)
MCV: 93.4 fl (ref 78.0–100.0)
Platelets: 246 10*3/uL (ref 150.0–400.0)
RBC: 4.22 Mil/uL (ref 3.87–5.11)
RDW: 13.5 % (ref 11.5–15.5)
WBC: 5.7 10*3/uL (ref 4.0–10.5)

## 2017-09-17 LAB — LIPID PANEL
Cholesterol: 169 mg/dL (ref 0–200)
HDL: 44 mg/dL (ref >39.00)
LDL Cholesterol: 102 mg/dL — ABNORMAL HIGH (ref 0–99)
NONHDL: 124.69
Total CHOL/HDL Ratio: 4
Triglycerides: 113 mg/dL (ref 0.0–149.0)
VLDL: 22.6 mg/dL (ref 0.0–40.0)

## 2017-09-17 LAB — TSH: TSH: 0.71 u[IU]/mL (ref 0.35–4.50)

## 2017-09-17 NOTE — Assessment & Plan Note (Signed)
Hypothyroidism  Check a TSH Insomnia: On Xanax nightly. Pain management, back pain: takes   hydrocodone on average TID in addition takes Aleve or Motrin without apparent side effects. Again discussed the importance of decrease hydrocodone to BID and use other ways to control pain including stretching, yoga, Tylenol and judicious use of NSAIDs. Osteopenia: Per gynecology, on Prolia, calcium and vitamin D. Lung cancer screening: Last CT 09/2016. Aortic sclerosis per CT, goal is good cholesterol control, last LDL was 99.  Satisfactory. RTC 6 months

## 2017-09-17 NOTE — Assessment & Plan Note (Signed)
--  TD 08-2015; Pneumonia shot 2006 and 2013;  prevnar 2015; s/p zostavax ; shingrex not available --Cscope 11-2005:  polyps-adenomatous, repeated  colonoscopy 11/2008 , 04-2012, 04/2017, + polyps, 3 years -- Female care per gyn, MMG 02-2017, PAP 12/2016  --Palpable aorta-- US neg for AAA 07-2012 --diet and exercise discussed  --Labs: CMP, FLP, CBC, TSH

## 2017-09-17 NOTE — Patient Instructions (Addendum)
GO TO THE LAB : Get the blood work     GO TO THE FRONT DESK Schedule your next appointment for a   checkup in 6 months 

## 2017-09-17 NOTE — Progress Notes (Signed)
Subjective:    Patient ID: [name], female    DOB: 21-Mar-1946, 72 y.o.   MRN: [id number]  DOS:  09/17/2017 Type of visit - description : cpx Interval history: Routine visit, no major concerns   Review of Systems On hydrocodone, on average 3 times a day.  Take it for different pains, back or hip or both.  Other than above, a 14 point review of systems is negative    Past Medical History:  Diagnosis Date  . Cataract    both eyes rt. eye was removed  . Chest pain    stress test @ [facility] cardiology (-)  . Colonic polyp    bx adenomatous polyps, next scope 2010  . Fibromyalgia    see rheumatology  . Glaucoma    not glaucoma surgically removed growths on cornea  . Hypothyroidism   . Migraine   . 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 Osteoporosis    osteopenia  . Pleuritic chest pain    etiology unclear, has beeb eval by pulmonary and rheumatology (autoimmune dz?)  . Wrist fracture 2018    Past Surgical History:  Procedure Laterality Date  . COLONOSCOPY    . ESOPHAGOGASTRODUODENOSCOPY (EGD) WITH PROPOFOL N/A 03/27/2013   Procedure: ESOPHAGOGASTRODUODENOSCOPY (EGD) WITH PROPOFOL;  Surgeon: Milus Banister, MD;  Location: [facility] END[facility]COPY;  Service: Endoscopy;  Laterality: N/A;  possible dil  . 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  . plastic surgery face  2013  . POLYPECTOMY    . right rotator cuff repair  2010  . TONSILLECTOMY      Social History   Socioeconomic History  . Marital status: Married    Spouse name: Not on file  . Number of children: 0  . Years of education: Not on file  . Highest education level: Not on file  Occupational History  . Occupation: retired, 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  Social Needs  . Financial resource strain: Not on file  . Food insecurity:    Worry: Not on file    Inability: Not on file  . Transportation needs:    Medical: Not on file    Non-medical: Not on file  Tobacco Use  . Smoking status: Former Smoker   Packs/day: 1.00    Years: 38.50    Pack years: 38.50    Types: Cigarettes    Last attempt to quit: 01/13/2004    Years since quitting: 13.6  . Smokeless tobacco: Never Used  . Tobacco comment: quit 2005  Substance and Sexual Activity  . Alcohol use: No    Alcohol/week: 0.0 oz    Comment: rare  . Drug use: No  . Sexual activity: Not Currently  Lifestyle  . Physical activity:    Days per week: Not on file    Minutes per session: Not on file  . Stress: Not on file  Relationships  . Social connections:    Talks on phone: Not on file    Gets together: Not on file    Attends religious service: Not on file    Active member of club or organization: Not on file    Attends meetings of clubs or organizations: Not on file    Relationship status: Not on file  . Intimate partner violence:    Fear of current or ex partner: Not on file    Emotionally abused: Not on file    Physically abused: Not on file  Forced sexual activity: Not on file  Other Topics Concern  . Not on file  Social History Narrative   No biological children        Family History  Adopted: Yes  Problem Relation Age of Onset  . Colon cancer Neg Hx   . Cancer Neg Hx   . Depression Neg Hx   . Diabetes Neg Hx   . Stroke Neg Hx   . Hypertension Neg Hx   . Heart disease Neg Hx   . Colon polyps Neg Hx      Allergies as of 09/17/2017      Reactions   Alendronate Sodium    REACTION: urticaria (hives)   Bactrim [sulfamethoxazole-trimethoprim]    Upset stomach   Contrast Media [iodinated Diagnostic Agents] Hives   Milnacipran    REACTION: increased bp, increased hr   Penicillins    REACTION: urticaria (hives)      Medication List        Accurate as of 09/17/17 12:52 PM. Always use your most recent med list.          ALPRAZolam 0.5 MG tablet Commonly known as:  XANAX Take 1 tablet (0.5 mg total) by mouth at bedtime as needed for anxiety.   calcium carbonate 1250 (500 Ca) MG chewable tablet Commonly  known as:  OS-CAL Chew 1 tablet by mouth daily.   conjugated estrogens vaginal cream Commonly known as:  PREMARIN Place 1/2 gram per vagina at bedtime twice a week.   denosumab 60 MG/ML Soln injection Commonly known as:  PROLIA Inject 60 mg into the skin every 6 (six) months. Administer in upper arm, thigh, or abdomen   HYDROcodone-acetaminophen 5-325 MG tablet Commonly known as:  NORCO/VICODIN Take 1 tablet by mouth every 6 (six) hours as needed for severe pain.   levothyroxine 88 MCG tablet Commonly known as:  SYNTHROID, LEVOTHROID Take 1 tablet (88 mcg total) by mouth daily before breakfast.   multivitamin capsule Take 1 capsule by mouth daily.   polycarbophil 625 MG tablet Commonly known as:  FIBERCON Take 625 mg by mouth daily.   Vitamin D3 1000 units Caps Take 1,200 Units by mouth.          Objective:   Physical Exam BP 130/82 (BP Location: Left Arm, Patient Position: Sitting, Cuff Size: Small)   Pulse 68   Temp 98.3 F (36.8 C) (Oral)   Resp 16   Ht 5\' 6"  (1.676 m)   Wt 178 lb (80.7 kg)   LMP 04/10/1998 (Approximate)   SpO2 97%   BMI 28.73 kg/m  General:   Well developed, well nourished . NAD.  Neck: No  thyromegaly  HEENT:  Normocephalic . Face symmetric, atraumatic Lungs:  CTA B Normal respiratory effort, no intercostal retractions, no accessory muscle use. Heart: RRR,  no murmur.  No pretibial edema bilaterally  Abdomen:  Not distended, soft, non-tender. No rebound or rigidity.   Skin: Exposed areas without rash. Not pale. Not jaundice Neurologic:  alert & oriented X3.  Speech normal, gait appropriate for age and unassisted Strength symmetric and appropriate for age.  Psych: Cognition and judgment appear intact.  Cooperative with normal attention span and concentration.  Behavior appropriate. No anxious or depressed appearing.     Assessment & Plan:   Assessment Hypothyroidism GERD Migraines insmonia-- xanax prn MSK- Pain mngmt    -- back pain, 2017 had a MRI, saw Dr Durward Fortes, Rx local injection, PT --History of fibromyalgia --H/o Pleuritic chest pain, on-off, resolved  Osteopenia:  Per gyn as off 2019 --T score 2011   -2.6 (intolerant to fosamax, ok w/ Boniva x years) --T score 01-2013   -2.6 --T score 02-2015  -2.3, rx ca and cit D  Glaucoma Ocular Rosacea 2010 Lung cancer screening CT 09-2015 CP admitted, stress lest low risk 05-2015   PLAN: Hypothyroidism  Check a TSH Insomnia: On Xanax nightly. Pain management, back pain: takes   hydrocodone on average TID in addition takes Aleve or Motrin without apparent side effects. Again discussed the importance of decrease hydrocodone to BID and use other ways to control pain including stretching, yoga, Tylenol and judicious use of NSAIDs. Osteopenia: Per gynecology, on Prolia, calcium and vitamin D. Lung cancer screening: Last CT 09/2016. Aortic sclerosis per CT, goal is good cholesterol control, last LDL was 99.  Satisfactory. RTC 6 months

## 2017-09-25 ENCOUNTER — Ambulatory Visit (HOSPITAL_BASED_OUTPATIENT_CLINIC_OR_DEPARTMENT_OTHER)
Admission: RE | Admit: 2017-09-25 | Discharge: 2017-09-25 | Disposition: A | Discharge status: Home / Self Care | Payer: Medicare HMO | Source: Ambulatory Visit | Attending: Acute Care | Admitting: Acute Care

## 2017-09-25 DIAGNOSIS — Z122 Encounter for screening for malignant neoplasm of respiratory organs: Secondary | ICD-10-CM | POA: Insufficient documentation

## 2017-09-25 DIAGNOSIS — Z87891 Personal history of nicotine dependence: Secondary | ICD-10-CM | POA: Diagnosis not present

## 2017-09-25 DIAGNOSIS — I2510 Atherosclerotic heart disease of native coronary artery without angina pectoris: Secondary | ICD-10-CM | POA: Insufficient documentation

## 2017-09-25 DIAGNOSIS — J439 Emphysema, unspecified: Secondary | ICD-10-CM | POA: Insufficient documentation

## 2017-09-25 DIAGNOSIS — I700 Atherosclerosis of aorta: Secondary | ICD-10-CM | POA: Diagnosis not present

## 2017-09-27 ENCOUNTER — Other Ambulatory Visit: Payer: Self-pay | Admitting: Acute Care

## 2017-09-27 DIAGNOSIS — Z79890 Hormone replacement therapy (postmenopausal): Secondary | ICD-10-CM | POA: Diagnosis not present

## 2017-09-27 DIAGNOSIS — G8929 Other chronic pain: Secondary | ICD-10-CM | POA: Diagnosis not present

## 2017-09-27 DIAGNOSIS — R69 Illness, unspecified: Secondary | ICD-10-CM | POA: Diagnosis not present

## 2017-09-27 DIAGNOSIS — Z87891 Personal history of nicotine dependence: Secondary | ICD-10-CM

## 2017-09-27 DIAGNOSIS — E039 Hypothyroidism, unspecified: Secondary | ICD-10-CM | POA: Diagnosis not present

## 2017-09-27 DIAGNOSIS — Z122 Encounter for screening for malignant neoplasm of respiratory organs: Secondary | ICD-10-CM

## 2017-09-27 DIAGNOSIS — M810 Age-related osteoporosis without current pathological fracture: Secondary | ICD-10-CM | POA: Diagnosis not present

## 2017-09-27 DIAGNOSIS — K08409 Partial loss of teeth, unspecified cause, unspecified class: Secondary | ICD-10-CM | POA: Diagnosis not present

## 2017-09-27 DIAGNOSIS — H04129 Dry eye syndrome of unspecified lacrimal gland: Secondary | ICD-10-CM | POA: Diagnosis not present

## 2017-09-27 DIAGNOSIS — N951 Menopausal and female climacteric states: Secondary | ICD-10-CM | POA: Diagnosis not present

## 2017-09-27 DIAGNOSIS — Z79891 Long term (current) use of opiate analgesic: Secondary | ICD-10-CM | POA: Diagnosis not present

## 2017-09-28 ENCOUNTER — Ambulatory Visit (INDEPENDENT_AMBULATORY_CARE_PROVIDER_SITE_OTHER): Payer: Medicare HMO

## 2017-09-28 ENCOUNTER — Ambulatory Visit (INDEPENDENT_AMBULATORY_CARE_PROVIDER_SITE_OTHER): Payer: Medicare HMO | Admitting: Orthopaedic Surgery

## 2017-09-28 ENCOUNTER — Encounter (INDEPENDENT_AMBULATORY_CARE_PROVIDER_SITE_OTHER): Payer: Self-pay | Admitting: Orthopaedic Surgery

## 2017-09-28 VITALS — BP 151/82 | HR 65 | Ht 66.0 in | Wt 178.0 lb

## 2017-09-28 DIAGNOSIS — M79645 Pain in left finger(s): Secondary | ICD-10-CM | POA: Diagnosis not present

## 2017-09-28 MED ORDER — DICLOFENAC SODIUM 1 % TD GEL: TRANSDERMAL | 4 refills | Status: DC

## 2017-09-28 NOTE — Progress Notes (Signed)
Office Visit Note   Patient: [name]           Date of Birth: 01/06/1946           MRN: [id number] Visit Date: 09/28/2017              Requested by: Colon Branch, [address] [place], [place] [address] PCP: Colon Branch, MD   Assessment & Plan: Visit Diagnoses:  1. Pain of left thumb     Plan: De Quervain's tenosynovitis left thumb.  De Quervain's's splint.  Voltaren gel.  Office as needed  Follow-Up Instructions: Return if symptoms worsen or fail to improve.   Orders:  Orders Placed This Encounter  Procedures  . XR Finger Thumb Left   Meds ordered this encounter  Medications  . diclofenac sodium (VOLTAREN) 1 % GEL    Sig: Apply 3 times daily as needed to large joint.    Dispense:  3 Tube    Refill:  4      Procedures: No procedures performed   Clinical Data: No additional findings.   Subjective: Chief Complaint  Patient presents with  . Left Hand - Pain  . New Patient (Initial Visit)    L THUMB PAIN HURT SHAKING OUT RUGS LAST WEEKEND  [name] is 72 years old accompanied by her husband and here for evaluation of injury to her left thumb.  She was simply shaking a rug last week when she felt something "pop" in her left thumb.  She is experienced some swelling along the base of her thumb and into the dorsum of her wrist radially.  Having some pain with motion of the thumb.  No skin changes otherwise.  No numbness or tingling.  No deformity or black-blue discoloration  HPI  Review of Systems  Constitutional: Negative for fatigue and fever.  HENT: Negative for ear discharge.   Eyes: Negative for pain.  Respiratory: Negative for cough and shortness of breath.   Cardiovascular: Negative for leg swelling.  Gastrointestinal: Negative for constipation and diarrhea.  Genitourinary: Negative for difficulty urinating.  Musculoskeletal: Negative for back pain and neck pain.  Skin: Negative for rash.  Allergic/Immunologic: Negative for food  allergies.  Neurological: Positive for weakness and numbness.  Hematological: Does not bruise/bleed easily.  Psychiatric/Behavioral: Positive for sleep disturbance.     Objective: Vital Signs: BP (!) 151/82 (BP Location: Left Arm, Patient Position: Sitting, Cuff Size: Normal)   Pulse 65   Ht 5\' 6"  (1.676 m)   Wt 178 lb (80.7 kg)   LMP 04/10/1998 (Approximate)   BMI 28.73 kg/m   Physical Exam  Constitutional: She is oriented to person, place, and time. She appears well-developed and well-nourished.  HENT:  Mouth/Throat: Oropharynx is clear and moist.  Eyes: Pupils are equal, round, and reactive to light. EOM are normal.  Pulmonary/Chest: Effort normal.  Neurological: She is alert and oriented to person, place, and time.  Skin: Skin is warm and dry.  Psychiatric: She has a normal mood and affect. Her behavior is normal.    Ortho Exam awake alert and oriented x3.  Comfortable sitting.  Examination of the left thumb reveals some tenderness in the first dorsal extensor compartment associated with a positive Finkelstein's test.  No pain at the IP joint or at the base of the thumb.  No pain over the radiocarpal joint or snuffbox.  Neurovascular exam intact  Specialty Comments:  No specialty comments available.  Imaging: Xr  Finger Thumb Left  Result Date: 09/28/2017 Films of the left thumb were obtained in several projections.  I did not see any acute changes there is a sesamoid at the metacarpal phalangeal joint and at the IP joint. no significant degenerative change at the base of the thumb or subluxation.  Appears to have had an old distal radius fracture with healing good position    PMFS History: Patient Active Problem List   Diagnosis Date Noted  . Chronic back pain 03/15/2017  . PCP NOTES >>>>>>>>>>>>>>>>>>>> 05/24/2015  . Numbness and tingling of right arm 05/22/2015  . Pain in the chest   . Chest pain 05/21/2015  . Insomnia 09/01/2014  . GERD (gastroesophageal reflux  disease) 03/27/2013  . Dysphagia, pharyngoesophageal phase 03/27/2013  . Annual physical exam 06/12/2011  . Sinusitis, chronic 02/02/2011  . Osteoporosis 06/08/2010  . Fibromyalgia, pain mngmt 01/29/2009  . LIPOMA 09/30/2008  . Migraine without aura 03/04/2007  . Hypothyroidism 05/15/2006  . COLONIC POLYPS, HX OF 05/15/2006   Past Medical History:  Diagnosis Date  . Cataract    both eyes rt. eye was removed  . Chest pain    stress test @ [facility] cardiology (-)  . Colonic polyp    bx adenomatous polyps, next scope 2010  . Fibromyalgia    see rheumatology  . Glaucoma    not glaucoma surgically removed growths on cornea  . Hypothyroidism   . Migraine   . 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 Osteoporosis    osteopenia  . Pleuritic chest pain    etiology unclear, has beeb eval by pulmonary and rheumatology (autoimmune dz?)  . Wrist fracture 2018    Family History  Adopted: Yes  Problem Relation Age of Onset  . Colon cancer Neg Hx   . Cancer Neg Hx   . Depression Neg Hx   . Diabetes Neg Hx   . Stroke Neg Hx   . Hypertension Neg Hx   . Heart disease Neg Hx   . Colon polyps Neg Hx     Past Surgical History:  Procedure Laterality Date  . COLONOSCOPY    . ESOPHAGOGASTRODUODENOSCOPY (EGD) WITH PROPOFOL N/A 03/27/2013   Procedure: ESOPHAGOGASTRODUODENOSCOPY (EGD) WITH PROPOFOL;  Surgeon: Milus Banister, MD;  Location: [facility];  Service: Endoscopy;  Laterality: N/A;  possible dil  . 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  . plastic surgery face  2013  . POLYPECTOMY    . right rotator cuff repair  2010  . TONSILLECTOMY     Social History   Occupational History  . 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 Smoking status: Former Smoker    Packs/day: 1.00    Years: 38.50    Pack years: 38.50    Types: Cigarettes    Last attempt to quit: 01/13/2004    Years since quitting: 13.7  . Smokeless tobacco: Never Used  . Tobacco comment:  quit 2005  Substance and Sexual Activity  . Alcohol use: No    Alcohol/week: 0.0 oz    Comment: rare  . Drug use: No  . Sexual activity: Not Currently

## 2017-10-05 IMAGING — MR MR LUMBAR SPINE W/O CM
4 of 5 series · 25 of 48 positions shown · non-contrast
Comparison: MRI lumbar spine 07/10/2007.

CLINICAL DATA: Low back and LEFT hip pain. Injured while dancing in
March 2015.

EXAM:
MRI LUMBAR SPINE WITHOUT CONTRAST
TECHNIQUE: Multiplanar, multisequence MR imaging of the lumbar spine was
performed. No intravenous contrast was administered.

[Series 2: T2 · sagittal · 4.0mm · 0.81mm/px · 6 of 14 slices shown (1 of 2)]
[im 1/14]
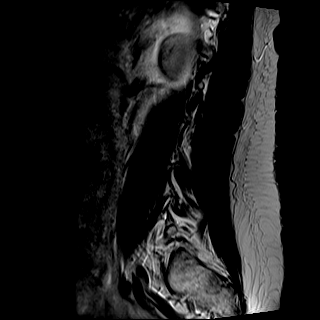
[im 3/14]
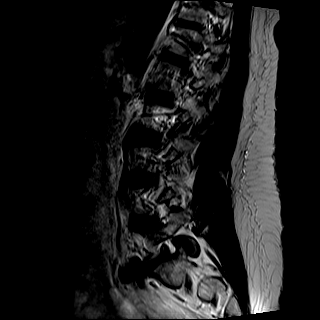
[im 6/14]
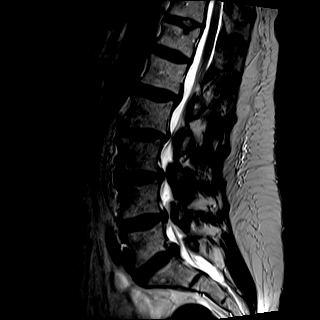
[im 8/14]
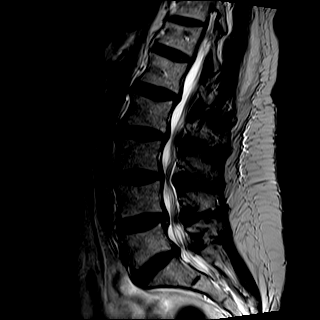
[im 11/14]
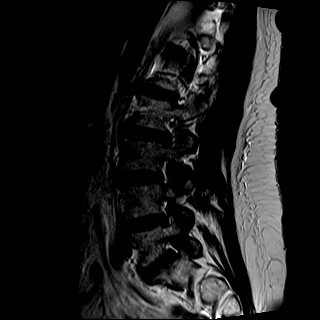
[im 14/14]
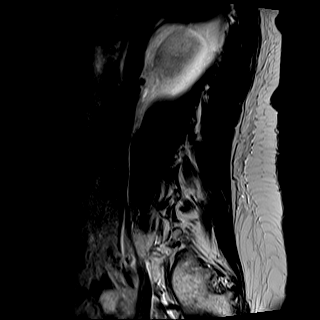

[Series 3: T1 · sagittal · 4.0mm · 0.51mm/px · 5 of 14 slices shown (1 of 2)]
[im 1/14]
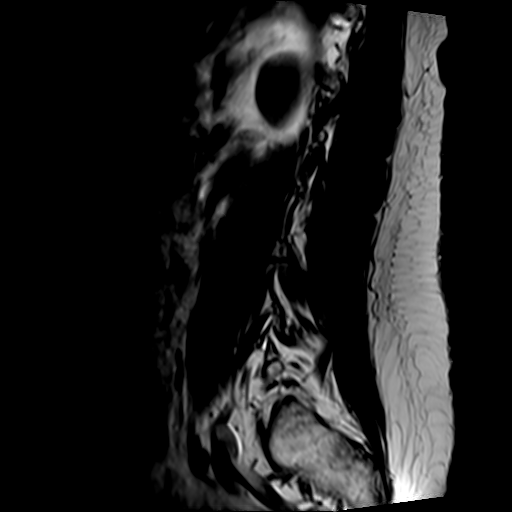
[im 4/14]
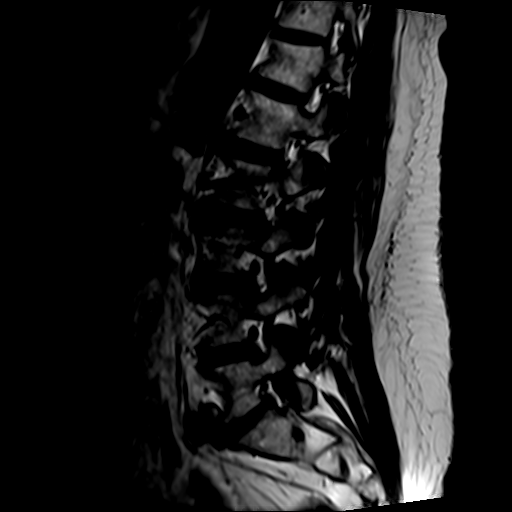
[im 7/14]
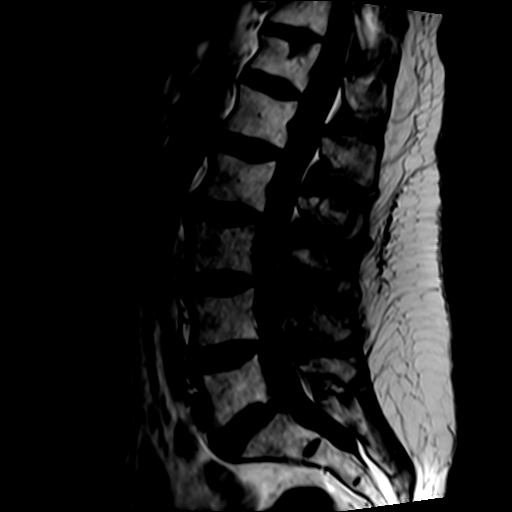
[im 10/14]
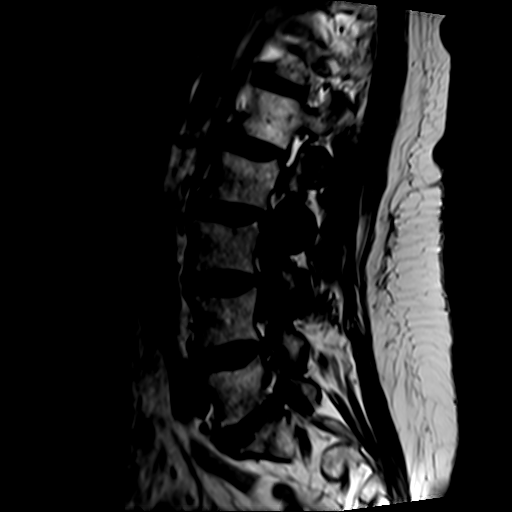
[im 14/14]
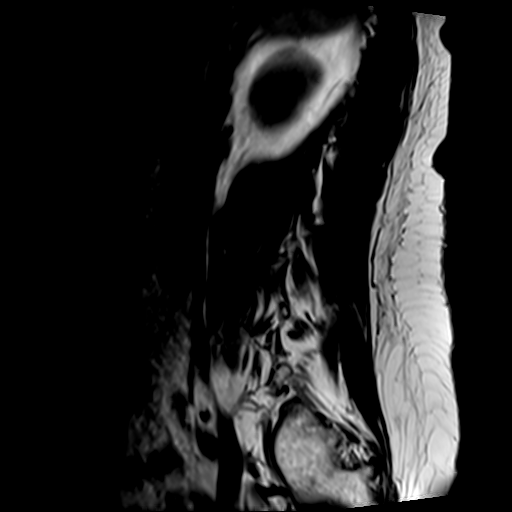

[Series 5: T2 · axial · 4.0mm · 0.39mm/px · z∈[-95,+127]mm · 10 of 42 slices shown (2 of 2)]
[im 3/42]
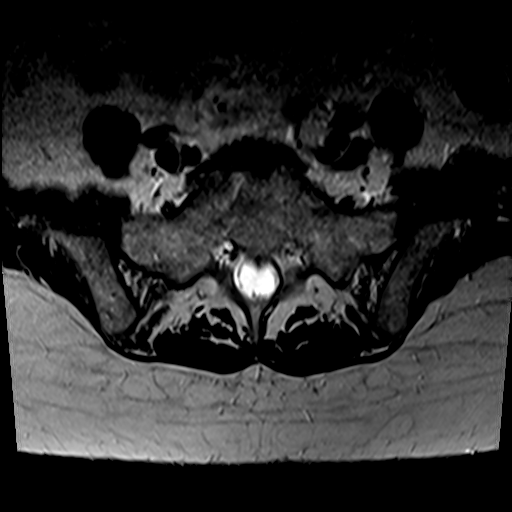
[im 6/42]
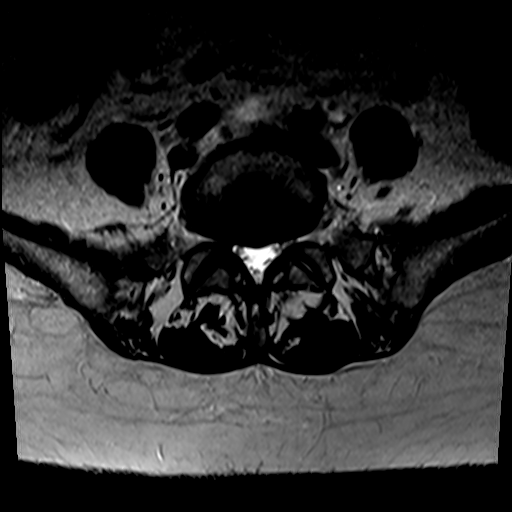
[im 9/42]
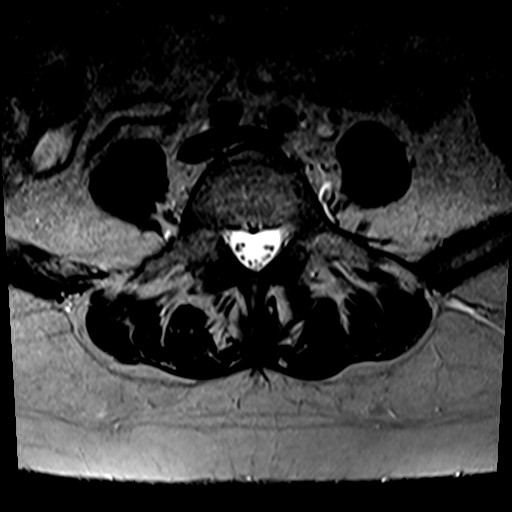
[im 14/42]
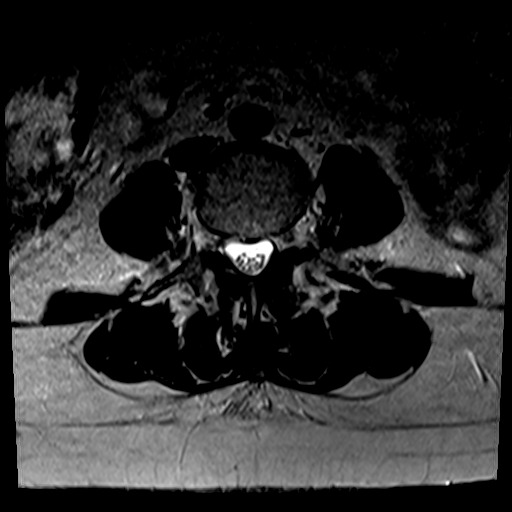
[im 20/42]
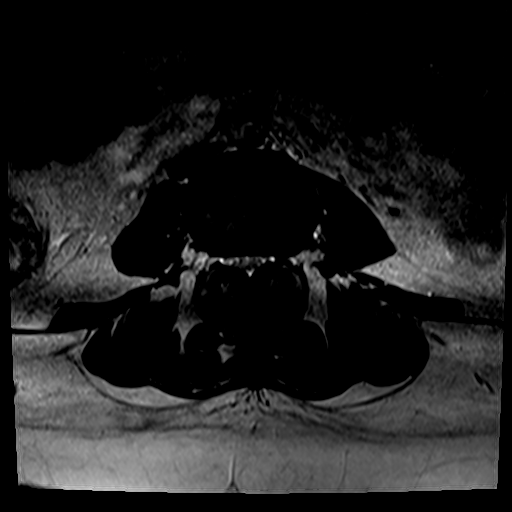
[im 22/42]
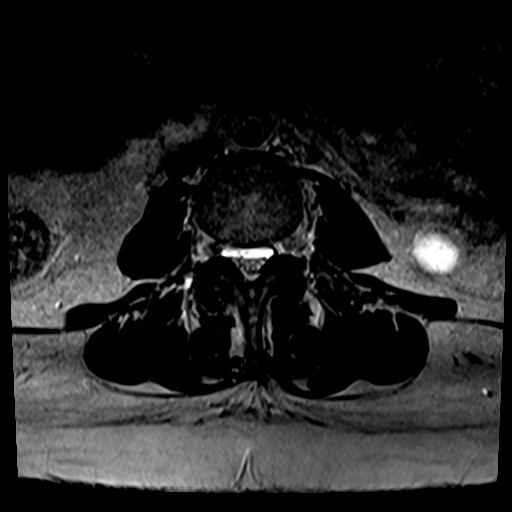
[im 25/42]
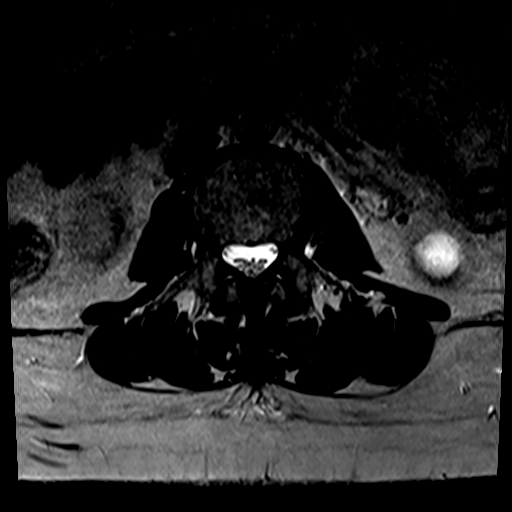
[im 31/42]
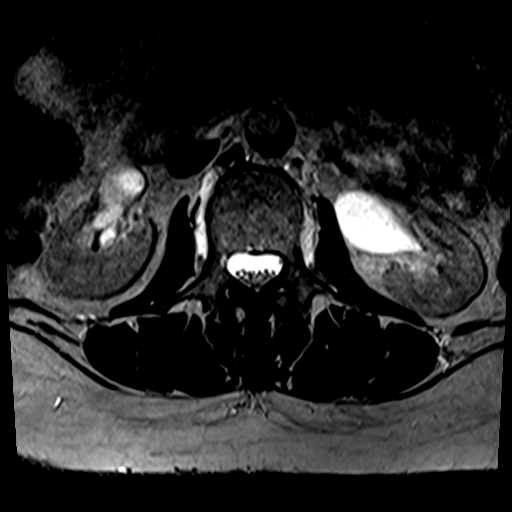
[im 36/42]
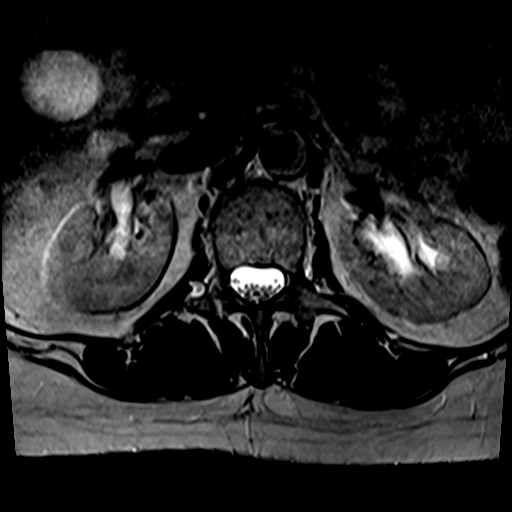
[im 42/42]
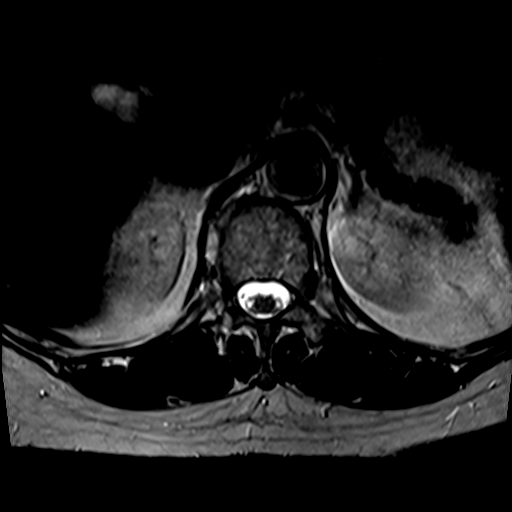

[Series 6: T1 · axial · 4.0mm · 0.78mm/px · z∈[-95,+97]mm · 4 of 42 slices shown (2 of 2)]
[im 3/42]
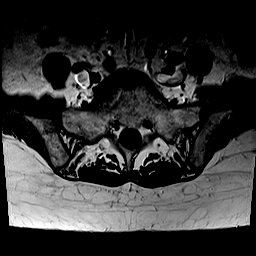
[im 6/42]
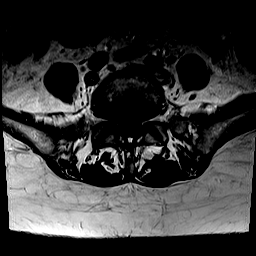
[im 22/42]
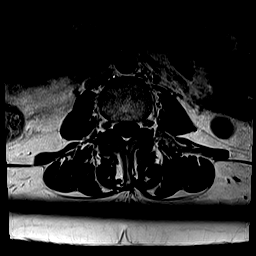
[im 36/42]
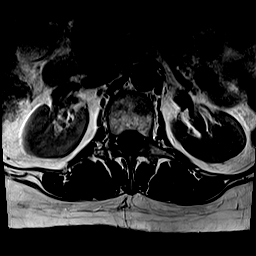

[25 of 48 positions shown; findings below may reference images not displayed]

FINDINGS: Segmentation: Normal.

Alignment: Trace anterolisthesis L3-4, and trace retrolisthesis L4-5
are facet mediated.

Vertebrae: No worrisome osseous lesion.Old T12 compression fracture.

Conus medullaris: Normal in size, signal, and location.

Paraspinal tissues: No evidence for hydronephrosis or paravertebral
mass. Renal cystic disease, incompletely evaluated.

Disc levels:

L1-L2: Shallow protrusion. Mild facet arthropathy. No definite
impingement.

L2-L3: Broad-based protrusion extends to both neural foramina.
Posterior element hypertrophy. Mild stenosis. Subarticular zone
narrowing could affect either L3 nerve root. No definite L2 nerve
root compression.

L3-L4: Central protrusion. Extraforaminal protrusion on the LEFT.
Mild LEFT-sided foraminal narrowing. Trace anterolisthesis. Moderate
stenosis. Posterior element hypertrophy. LEFT L4 and L3 nerve root
impingement are possible.

L4-L5: 3 mm retrolisthesis. Central and rightward protrusion.
Posterior element hypertrophy. Moderate stenosis. BILATERAL
foraminal narrowing. RIGHT greater than LEFT L4 and L5 nerve root
impingement are possible.

L5-S1: Central and leftward protrusion. Posterior element
hypertrophy. BILATERAL foraminal narrowing. LEFT greater than RIGHT
S1 and L5 nerve root impingement

Compared with priors, there is progression of degenerative disc
disease at all levels. Compression fracture was present previously.
IMPRESSION: Multilevel spondylosis as described, with progression since 2447.

Potentially symptomatic LEFT-sided neural impingement at L2-3, L3-4,
L4-5, and L5-S1. Moderate stenosis is worst at L3-4 and L4-5.

Chronic compression deformity T12, stable.

## 2017-10-19 ENCOUNTER — Telehealth: Payer: Self-pay | Admitting: *Deleted

## 2017-10-19 ENCOUNTER — Telehealth: Payer: Self-pay | Admitting: Obstetrics and Gynecology

## 2017-10-19 NOTE — Telephone Encounter (Signed)
Spoke with patient. Nurse visit scheduled for 11/01/17 at 2:30pm for prolia inj.   Routing to provider for final review. Patient is agreeable to disposition. Will close encounter.

## 2017-10-19 NOTE — Telephone Encounter (Signed)
Patient returning call to Jill. °

## 2017-10-19 NOTE — Telephone Encounter (Signed)
[facility] specialty pharmacy calling to schedule delivery. Prolia injection scheduled for delivery to [facility] on 10/23/17.    Last prolia injection on 04/27/17 Due on or after 10/24/17

## 2017-10-19 NOTE — Telephone Encounter (Signed)
Patient left voicemail stating that she had clinical questions concerning her Prolia injection appointment.

## 2017-10-19 NOTE — Telephone Encounter (Signed)
Spoke with patients spouse, Gershon Mussel, left message to call Sharee Pimple at [phone].

## 2017-10-19 NOTE — Telephone Encounter (Signed)
Spoke with patient. Patient calling to confirm when next prolia will be due if she receives injection on 11/01/17. Advised patient 180 days from injection,  on or after 04/29/18. Patient will keep nurse visit as scheduled, has some questions for her insurance provider regarding future prolia refills, she will contact [facility] directly, will return call to office if any additional assistance needed.   Routing to provider for final review. Patient is agreeable to disposition. Will close encounter.

## 2017-10-22 ENCOUNTER — Telehealth: Payer: Self-pay | Admitting: Obstetrics and Gynecology

## 2017-10-22 NOTE — Telephone Encounter (Signed)
Patient would like to reschedule her prolia injection to this Thursday 10/25/17. Routing to triage to ensure we will receive the injection in time.

## 2017-10-22 NOTE — Telephone Encounter (Signed)
Spoke with patient. Prolia injection rescheduled to 10/24/17 at 10:30am.   Prolia scheduled for delivery to [facility] 7/16 Next Prolia due on/after 10/24/17   Routing to provider for final review. Patient is agreeable to disposition. Will close encounter.

## 2017-10-24 ENCOUNTER — Telehealth: Payer: Self-pay | Admitting: Internal Medicine

## 2017-10-24 MED ORDER — HYDROCODONE-ACETAMINOPHEN 5-325 MG PO TABS: 1.0000 | ORAL_TABLET | Freq: Four times a day (QID) | ORAL | 0 refills | Status: DC | PRN

## 2017-10-24 NOTE — Telephone Encounter (Signed)
Pt requesting refill on hydrocodone.   Last OV: 09/17/2017  Last Fill: 05/19/2017 #270 and 0RF UDS: 04/26/2017 Low risk  NCCR printed- no discrepancies noted- sent for scanning  Please advise.

## 2017-10-24 NOTE — Telephone Encounter (Signed)
sig is q 6 hours prn, I advised her to take as little as possible, based on refill pattern, she has decreased hydrocodone to 1 tablet twice a day which is great. Prescription sent

## 2017-10-25 ENCOUNTER — Ambulatory Visit (INDEPENDENT_AMBULATORY_CARE_PROVIDER_SITE_OTHER): Payer: Medicare HMO

## 2017-10-25 VITALS — BP 136/88 | HR 72 | Resp 14 | Ht 66.0 in | Wt 180.0 lb

## 2017-10-25 DIAGNOSIS — M8580 Other specified disorders of bone density and structure, unspecified site: Secondary | ICD-10-CM

## 2017-10-25 MED ORDER — DENOSUMAB 60 MG/ML ~~LOC~~ SOSY
60.0000 mg | PREFILLED_SYRINGE | Freq: Once | SUBCUTANEOUS | Status: AC
  Administered 2017-10-25: 60 mg via SUBCUTANEOUS

## 2017-10-25 NOTE — Progress Notes (Signed)
Patient in today for second Prolia injection. Patient's initial calcium level was obtained on 04/26/17.  Result: 9.1  Last AEX:01/03/17  Last BMD: 02/15/17  Injection given in left arm.  Patient tolerated injection well.  Routed to provider for review.

## 2017-11-01 ENCOUNTER — Ambulatory Visit: Payer: Self-pay

## 2017-11-04 ENCOUNTER — Other Ambulatory Visit: Payer: Self-pay | Admitting: Internal Medicine

## 2017-11-05 ENCOUNTER — Telehealth: Payer: Self-pay | Admitting: Internal Medicine

## 2017-11-05 NOTE — Telephone Encounter (Signed)
Pt is requesting refill on alprazolam.   Last OV: 09/17/2017 Last Fill: 07/09/2017 #30 and 3RF UDS: 04/26/2017 Low risk  NCCR in media from 10/24/2017- no discrepancies noted  Please advise.

## 2017-11-06 DIAGNOSIS — H04123 Dry eye syndrome of bilateral lacrimal glands: Secondary | ICD-10-CM | POA: Diagnosis not present

## 2017-11-06 NOTE — Telephone Encounter (Signed)
Sent , 5 RF

## 2017-12-13 DIAGNOSIS — H0279 Other degenerative disorders of eyelid and periocular area: Secondary | ICD-10-CM | POA: Diagnosis not present

## 2017-12-13 DIAGNOSIS — H53483 Generalized contraction of visual field, bilateral: Secondary | ICD-10-CM | POA: Diagnosis not present

## 2017-12-13 DIAGNOSIS — H02834 Dermatochalasis of left upper eyelid: Secondary | ICD-10-CM | POA: Diagnosis not present

## 2017-12-13 DIAGNOSIS — H02423 Myogenic ptosis of bilateral eyelids: Secondary | ICD-10-CM | POA: Diagnosis not present

## 2017-12-13 DIAGNOSIS — H02413 Mechanical ptosis of bilateral eyelids: Secondary | ICD-10-CM | POA: Diagnosis not present

## 2017-12-13 DIAGNOSIS — H57813 Brow ptosis, bilateral: Secondary | ICD-10-CM | POA: Diagnosis not present

## 2017-12-13 DIAGNOSIS — H02831 Dermatochalasis of right upper eyelid: Secondary | ICD-10-CM | POA: Diagnosis not present

## 2018-01-07 ENCOUNTER — Other Ambulatory Visit: Payer: Self-pay | Admitting: Obstetrics and Gynecology

## 2018-01-07 DIAGNOSIS — Z1231 Encounter for screening mammogram for malignant neoplasm of breast: Secondary | ICD-10-CM

## 2018-01-08 NOTE — Progress Notes (Signed)
72 y.o. G0P0000 Married Caucasian female here for annual exam.    Second Prolia given on 10/25/17.   Using vaginal estrogen cream twice weekly.  Does not need refill.  Difficulty with BMx.  Left hip pain.  Seeing Dr. Durward Fortes this afternoon.   PCP doing labs for patient.   PCP:   Dr Larose Kells  Patient's last menstrual period was 04/10/1998 (approximate).           Sexually active: No.  The current method of family planning is post menopausal status.    Exercising: No.  Walking 30 min Smoker:  no  Health Maintenance: Pap:  01/03/2017 normal History of abnormal Pap:  no MMG:  02/15/2017 BI-RADS CATEGORY  1: Negative.  Scheduled for this.  Colonoscopy:  05/04/2017 hx polyp follow up 3-5 years.   BMD:   02/15/2017  Result  osteopenic TDaP:  2017 Gardasil:   no [id number] Hep C:negative Screening Labs:   PCP.   reports that she quit smoking about 14 years ago. Her smoking use included cigarettes. She has a 38.50 pack-year smoking history. She has never used smokeless tobacco. She reports that she does not drink alcohol or use drugs.  Past Medical History:  Diagnosis Date  . Cataract    both eyes rt. eye was removed  . Chest pain    stress test @ [facility] cardiology (-)  . Colonic polyp    bx adenomatous polyps, next scope 2010  . Fibromyalgia    see rheumatology  . Glaucoma    not glaucoma surgically removed growths on cornea  . Hypothyroidism   . Migraine   . 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 Osteoporosis    osteopenia  . Pleuritic chest pain    etiology unclear, has beeb eval by pulmonary and rheumatology (autoimmune dz?)  . Wrist fracture 2018    Past Surgical History:  Procedure Laterality Date  . COLONOSCOPY    . ESOPHAGOGASTRODUODENOSCOPY (EGD) WITH PROPOFOL N/A 03/27/2013   Procedure: ESOPHAGOGASTRODUODENOSCOPY (EGD) WITH PROPOFOL;  Surgeon: Milus Banister, MD;  Location: [facility];  Service: Endoscopy;  Laterality: N/A;  possible dil  . EYE  SURGERY     growth on cornea-bil.  . plastic surgery face  2013  . POLYPECTOMY    . right rotator cuff repair  2010  . TONSILLECTOMY      Current Outpatient Medications  Medication Sig Dispense Refill  . ALPRAZolam (XANAX) 0.5 MG tablet TAKE 1 TABLET BY MOUTH AT BEDTIME AS NEEDED FOR ANXIETY. 30 tablet 5  . calcium carbonate (OS-CAL) 1250 (500 CA) MG chewable tablet Chew 1 tablet by mouth daily.    . Cholecalciferol (VITAMIN D3) 1000 UNITS CAPS Take 1,200 Units by mouth.    . conjugated estrogens (PREMARIN) vaginal cream Place 1/2 gram per vagina at bedtime twice a week. 30 g 0  . cycloSPORINE (RESTASIS) 0.05 % ophthalmic emulsion Restasis 0.05 % eye drops in a dropperette    . denosumab (PROLIA) 60 MG/ML SOLN injection Inject 60 mg into the skin every 6 (six) months. Administer in upper arm, thigh, or abdomen 1 Syringe 1  . diclofenac sodium (VOLTAREN) 1 % GEL Apply 3 times daily as needed to large joint. 3 Tube 4  . HYDROcodone-acetaminophen (NORCO/VICODIN) 5-325 MG tablet Take 1 tablet by mouth every 6 (six) hours as needed for severe pain. 270 tablet 0  . levothyroxine (SYNTHROID, LEVOTHROID) 88 MCG tablet Take 1 tablet (88 mcg total) by mouth daily before breakfast. 90 tablet  1  . Multiple Vitamin (MULTIVITAMIN) capsule Take 1 capsule by mouth daily.      . polycarbophil (FIBERCON) 625 MG tablet Take 625 mg by mouth daily.       Current Facility-Administered Medications  Medication Dose Route Frequency Provider Last Rate Last Dose  . 0.9 %  sodium chloride infusion  500 mL Intravenous Continuous Milus Banister, MD        Family History  Adopted: Yes  Problem Relation Age of Onset  . Colon cancer Neg Hx   . Cancer Neg Hx   . Depression Neg Hx   . Diabetes Neg Hx   . Stroke Neg Hx   . Hypertension Neg Hx   . Heart disease Neg Hx   . Colon polyps Neg Hx     Review of Systems  Constitutional: Negative.   HENT: Negative.   Eyes: Negative.   Respiratory: Negative.    Cardiovascular: Negative.   Gastrointestinal: Negative.   Endocrine: Negative.   Genitourinary: Negative.   Musculoskeletal: Positive for arthralgias and myalgias.  Skin: Negative.   Allergic/Immunologic: Negative.   Neurological: Negative.   Hematological: Negative.   Psychiatric/Behavioral: Negative.   All other systems reviewed and are negative.   Exam:   BP 124/68   Pulse 72   Ht 5\' 5"  (1.651 m)   Wt 180 lb (81.6 kg)   LMP 04/10/1998 (Approximate)   BMI 29.95 kg/m     General appearance: alert, cooperative and appears stated age Head: Normocephalic, without obvious abnormality, atraumatic Neck: no adenopathy, supple, symmetrical, trachea midline and thyroid normal to inspection and palpation Lungs: clear to auscultation bilaterally Breasts: normal appearance, no masses or tenderness, No nipple retraction or dimpling, No nipple discharge or bleeding, No axillary or supraclavicular adenopathy Heart: regular rate and rhythm Abdomen: soft, non-tender; no masses, no organomegaly Extremities: extremities normal, atraumatic, no cyanosis or edema Skin: Skin color, texture, turgor normal. No rashes or lesions Lymph nodes: Cervical, supraclavicular, and axillary nodes normal. No abnormal inguinal nodes palpated Neurologic: Grossly normal  Pelvic: External genitalia:  no lesions              Urethra:  normal appearing urethra with no masses, tenderness or lesions              Bartholins and Skenes: normal                 Vagina: normal appearing vagina with normal color and discharge, no lesions              Cervix: no lesions              Pap taken: No. Bimanual Exam:  Uterus:  normal size, contour, position, consistency, mobility, non-tender              Adnexa: no mass, fullness, tenderness              Rectal exam: Yes.  .  Confirms.              Anus:  normal sphincter tone, no lesions  Chaperone was present for exam.  Assessment:   Well woman visit with normal  exam. Osteopenia.  Hx fracture.  Increased risk for fractures by FRAX.  Allergy to Fosamax.  On Prolia.  Hypothyroidism. Difficulty completing bowel movements.  Vaginal atrophy.   Plan: Mammogram screening. Recommended self breast awareness. Pap and HR HPV as above. Guidelines for Calcium, Vitamin D, regular exercise program including cardiovascular and weight bearing exercise. Try  Miralax or Metamucil.  Reorder Prolia for 1 year.  BMD next year.  She will contact the office through the pharmacy if she needs refill of vaginal estrogen. I discussed potential effect on breast cancer.  Follow up annually and prn.   After visit summary provided.

## 2018-01-10 ENCOUNTER — Encounter (INDEPENDENT_AMBULATORY_CARE_PROVIDER_SITE_OTHER): Payer: Self-pay | Admitting: Orthopaedic Surgery

## 2018-01-10 ENCOUNTER — Ambulatory Visit (INDEPENDENT_AMBULATORY_CARE_PROVIDER_SITE_OTHER): Payer: Medicare HMO | Admitting: Obstetrics and Gynecology

## 2018-01-10 ENCOUNTER — Other Ambulatory Visit: Payer: Self-pay

## 2018-01-10 ENCOUNTER — Ambulatory Visit (INDEPENDENT_AMBULATORY_CARE_PROVIDER_SITE_OTHER): Payer: Medicare HMO | Admitting: Orthopaedic Surgery

## 2018-01-10 ENCOUNTER — Ambulatory Visit (INDEPENDENT_AMBULATORY_CARE_PROVIDER_SITE_OTHER): Payer: Medicare HMO

## 2018-01-10 ENCOUNTER — Encounter: Payer: Self-pay | Admitting: Obstetrics and Gynecology

## 2018-01-10 VITALS — BP 137/79 | HR 64 | Ht 65.0 in | Wt 180.0 lb

## 2018-01-10 VITALS — BP 124/68 | HR 72 | Ht 65.0 in | Wt 180.0 lb

## 2018-01-10 DIAGNOSIS — Z01419 Encounter for gynecological examination (general) (routine) without abnormal findings: Secondary | ICD-10-CM | POA: Diagnosis not present

## 2018-01-10 DIAGNOSIS — R69 Illness, unspecified: Secondary | ICD-10-CM | POA: Diagnosis not present

## 2018-01-10 DIAGNOSIS — M25552 Pain in left hip: Secondary | ICD-10-CM | POA: Diagnosis not present

## 2018-01-10 MED ORDER — DENOSUMAB 60 MG/ML ~~LOC~~ SOSY: 60.0000 mg | PREFILLED_SYRINGE | SUBCUTANEOUS | 1 refills | Status: DC

## 2018-01-10 NOTE — Progress Notes (Signed)
Office Visit Note   Patient: [name]           Date of Birth: Oct 26, 1945           MRN: [id number] Visit Date: 01/10/2018              Requested by: Colon Branch, [address] [place], [place] [address] PCP: Colon Branch, MD   Assessment & Plan: Visit Diagnoses:  1. Pain in left hip     Plan: Lateral left hip pain I believe is referred from the lumbar spine.  By exam the does not appear to be a bursitis or an issue with her left hip.  We will try some back exercises and over-the-counter medicines.  Will return for cortisone injection.  Consider MRI scan lumbar spine.  Follow-Up Instructions: Return if symptoms worsen or fail to improve.   Orders:  Orders Placed This Encounter  Procedures  . XR HIP UNILAT W OR W/O PELVIS 2-3 VIEWS LEFT   No orders of the defined types were placed in this encounter.     Procedures: No procedures performed   Clinical Data: No additional findings.   Subjective: Chief Complaint  Patient presents with  . Follow-up    L HIP PAIN FOR 2-3 MO NI INJURY, INJECTIONS OR PRIOR SURGERY  [name] is accompanied by her husband and here for evaluation of pain she is been experiencing along the lateral aspect of her left hip over the past several months.  She has not had any injury or trauma recently although there is been some remote history of.  She is not really having any groin pain.  She has not had any numbness or tingling in her leg.  She is not experiencing any back pain presently but has had some in the past.  She seems to have more discomfort when she sits for long period time.  Not when she walks.  HPI  Review of Systems  Constitutional: Negative for fatigue and fever.  HENT: Negative for ear pain.   Eyes: Negative for pain.  Respiratory: Negative for cough and shortness of breath.   Cardiovascular: Negative for leg swelling.  Gastrointestinal: Negative for constipation and diarrhea.  Genitourinary: Negative for  difficulty urinating.  Musculoskeletal: Negative for back pain and neck pain.  Skin: Negative for rash.  Allergic/Immunologic: Negative for food allergies.  Neurological: Negative for weakness and numbness.  Hematological: Bruises/bleeds easily.  Psychiatric/Behavioral: Positive for sleep disturbance.     Objective: Vital Signs: BP 137/79 (BP Location: Left Arm, Patient Position: Sitting, Cuff Size: Normal)   Pulse 64   Ht 5\' 5"  (1.651 m)   Wt 180 lb (81.6 kg)   LMP 04/10/1998 (Approximate)   BMI 29.95 kg/m   Physical Exam  Constitutional: She is oriented to person, place, and time. She appears well-developed and well-nourished.  HENT:  Mouth/Throat: Oropharynx is clear and moist.  Eyes: Pupils are equal, round, and reactive to light. EOM are normal.  Pulmonary/Chest: Effort normal.  Neurological: She is alert and oriented to person, place, and time.  Skin: Skin is warm and dry.  Psychiatric: She has a normal mood and affect. Her behavior is normal.    Ortho Exam awake alert and oriented x3.  Comfortable sitting.  Painless range of motion of left hip full range of motion.  Skin intact.  Little bit of tenderness in several areas over the superior greater trochanteric region.  Skin intact.  Straight leg  raise negative.  No percussible tenderness of the lumbar spine walks without a limp  Specialty Comments:  No specialty comments available.  Imaging: Xr Hip Unilat W Or W/o Pelvis 2-3 Views Left  Result Date: 01/10/2018 AP the pelvis and lateral left hip revealed minimal degenerative change about the left hip.  I do not think this is symptomatic.  There were some degenerative changes at the very last level of the lumbar spine onset and that one AP view which could be the cause of her lateral left hip pain    PMFS History: Patient Active Problem List   Diagnosis Date Noted  . Acute pain of left wrist 08/31/2017  . Closed fracture of distal end of radius 04/16/2017  . Chronic  back pain 03/15/2017  . PCP NOTES >>>>>>>>>>>>>>>>>>>> 05/24/2015  . Numbness and tingling of right arm 05/22/2015  . Pain in the chest   . Chest pain 05/21/2015  . Insomnia 09/01/2014  . GERD (gastroesophageal reflux disease) 03/27/2013  . Dysphagia, pharyngoesophageal phase 03/27/2013  . Annual physical exam 06/12/2011  . Sinusitis, chronic 02/02/2011  . Osteoporosis 06/08/2010  . Fibromyalgia, pain mngmt 01/29/2009  . LIPOMA 09/30/2008  . Migraine without aura 03/04/2007  . Hypothyroidism 05/15/2006  . COLONIC POLYPS, HX OF 05/15/2006   Past Medical History:  Diagnosis Date  . Cataract    both eyes rt. eye was removed  . Chest pain    stress test @ [facility] cardiology (-)  . Colonic polyp    bx adenomatous polyps, next scope 2010  . Fibromyalgia    see rheumatology  . Glaucoma    not glaucoma surgically removed growths on cornea  . Hypothyroidism   . Migraine   . 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 Osteoporosis    osteopenia  . Pleuritic chest pain    etiology unclear, has beeb eval by pulmonary and rheumatology (autoimmune dz?)  . Wrist fracture 2018    Family History  Adopted: Yes  Problem Relation Age of Onset  . Colon cancer Neg Hx   . Cancer Neg Hx   . Depression Neg Hx   . Diabetes Neg Hx   . Stroke Neg Hx   . Hypertension Neg Hx   . Heart disease Neg Hx   . Colon polyps Neg Hx     Past Surgical History:  Procedure Laterality Date  . COLONOSCOPY    . ESOPHAGOGASTRODUODENOSCOPY (EGD) WITH PROPOFOL N/A 03/27/2013   Procedure: ESOPHAGOGASTRODUODENOSCOPY (EGD) WITH PROPOFOL;  Surgeon: Milus Banister, MD;  Location: [facility];  Service: Endoscopy;  Laterality: N/A;  possible dil  . 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  . plastic surgery face  2013  . POLYPECTOMY    . right rotator cuff repair  2010  . TONSILLECTOMY     Social History   Occupational History  . 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 Smoking  status: Former Smoker    Packs/day: 1.00    Years: 38.50    Pack years: 38.50    Types: Cigarettes    Last attempt to quit: 01/13/2004    Years since quitting: 14.0  . Smokeless tobacco: Never Used  . Tobacco comment: quit 2005  Substance and Sexual Activity  . Alcohol use: No    Alcohol/week: 0.0 standard drinks    Comment: rare  . Drug use: No  . Sexual activity: Not Currently

## 2018-01-10 NOTE — Patient Instructions (Signed)

## 2018-01-10 NOTE — Patient Instructions (Signed)

## 2018-01-14 ENCOUNTER — Ambulatory Visit: Payer: Medicare HMO

## 2018-01-14 DIAGNOSIS — M545 Low back pain: Secondary | ICD-10-CM | POA: Diagnosis not present

## 2018-01-14 DIAGNOSIS — Z23 Encounter for immunization: Secondary | ICD-10-CM

## 2018-01-14 DIAGNOSIS — M5136 Other intervertebral disc degeneration, lumbar region: Secondary | ICD-10-CM | POA: Diagnosis not present

## 2018-01-15 ENCOUNTER — Ambulatory Visit: Payer: Medicare HMO

## 2018-01-22 ENCOUNTER — Telehealth: Payer: Self-pay | Admitting: Internal Medicine

## 2018-01-22 MED ORDER — HYDROCODONE-ACETAMINOPHEN 5-325 MG PO TABS: 1.0000 | ORAL_TABLET | Freq: Four times a day (QID) | ORAL | 0 refills | Status: DC | PRN

## 2018-01-22 NOTE — Telephone Encounter (Signed)
Sent!

## 2018-01-22 NOTE — Telephone Encounter (Signed)
Pt is requesting refill on hydrocodone.   Last OV: 09/17/2017 Last Fill: 10/24/2017 #270 and 0RF UDS: 04/26/2017 Low risk  NCCR in media 10/24/2017- no discrepancies noted

## 2018-01-24 ENCOUNTER — Encounter: Payer: Self-pay | Admitting: Physical Therapy

## 2018-01-24 ENCOUNTER — Ambulatory Visit: Payer: Medicare HMO | Attending: Physician Assistant | Admitting: Physical Therapy

## 2018-01-24 ENCOUNTER — Other Ambulatory Visit: Payer: Self-pay

## 2018-01-24 DIAGNOSIS — M545 Low back pain, unspecified: Secondary | ICD-10-CM

## 2018-01-24 DIAGNOSIS — R262 Difficulty in walking, not elsewhere classified: Secondary | ICD-10-CM | POA: Diagnosis not present

## 2018-01-24 DIAGNOSIS — R29898 Other symptoms and signs involving the musculoskeletal system: Secondary | ICD-10-CM

## 2018-01-24 DIAGNOSIS — M6281 Muscle weakness (generalized): Secondary | ICD-10-CM | POA: Diagnosis not present

## 2018-01-24 NOTE — Therapy (Signed)
[facility] [address]  [place], [place], [address] Phone: [phone]   Fax:  [phone]  Physical Therapy Evaluation  Patient Details  Name: [name] MRN: [id number] Date of Birth: [birth year] Referring Provider (PT): Darla Lesches [place], [place]   Encounter Date: 01/24/2018  PT End of Session - 01/24/18 1111    Visit Number  1    Number of Visits  8    Date for PT Re-Evaluation  03/07/18    Authorization Type  [employer]    PT Start Time  8250    PT Stop Time  1059    PT Time Calculation (min)  44 min    Activity Tolerance  Patient tolerated treatment well    Behavior During Therapy  [facility] for tasks assessed/performed       Past Medical History:  Diagnosis Date  . Cataract    both eyes rt. eye was removed  . Chest pain    stress test @ [facility] cardiology (-)  . Colonic polyp    bx adenomatous polyps, next scope 2010  . Fibromyalgia    see rheumatology  . Glaucoma    not glaucoma surgically removed growths on cornea  . Hypothyroidism   . Migraine   . 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 Osteoporosis    osteopenia  . Pleuritic chest pain    etiology unclear, has beeb eval by pulmonary and rheumatology (autoimmune dz?)  . Wrist fracture 2018    Past Surgical History:  Procedure Laterality Date  . COLONOSCOPY    . ESOPHAGOGASTRODUODENOSCOPY (EGD) WITH PROPOFOL N/A 03/27/2013   Procedure: ESOPHAGOGASTRODUODENOSCOPY (EGD) WITH PROPOFOL;  Surgeon: Milus Banister, MD;  Location: [facility];  Service: Endoscopy;  Laterality: N/A;  possible dil  . 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  . plastic surgery face  2013  . POLYPECTOMY    . right rotator cuff repair  2010  . TONSILLECTOMY      There were no vitals filed for this visit.   Subjective Assessment - 01/24/18 1016    Subjective  Patient reports she started having R hip/LBP about a month ago. Reports MDs believe this is coming  from her back. Notes no causative event. Was put on prednisone pack which relieved the pain, but pain is back now that she is off the steroids. Pain initially started in R anterior hip radiating to anterior thigh, causing feeling of buckling/giving out however no actual falls occurred. Currently pain starts in R side of LB, radiating to R anterior hip but not quite as bad as it was before taking prednisone. Aggravating factors include: standing rotation, standing, sitting, pressure of waistband on hip. Easing factors: meds, heat. Has hx of LBP- says she pulled some ligaments and this has never healed properly but has gotten used to it- received PT treatment for this.    Pertinent History  wrist fx, pleuritic chest pain, osteoporosis, migraine, hypothyroidism, glaucoma, fibromyalgia, R RTC repair    Limitations  Sitting;Lifting;Standing;Walking;House hold activities    How long can you sit comfortably?  5 min    How long can you stand comfortably?  30 min    How long can you walk comfortably?  20-30 min    Diagnostic tests  01/10/18 L hip xray: "minimal degenerative change about the left hip. I do not think this is symptomatic. There were some degenerative changes at the very last level of the  lumbar spine onset and that one AP view which could be the cause of her lateral left hip pain."    Patient Stated Goals  "getting rid of this (pain)"    Currently in Pain?  Yes    Pain Score  3     Pain Location  Back    Pain Orientation  Right;Lower    Pain Descriptors / Indicators  Aching    Pain Type  Acute pain    Pain Radiating Towards  R anterior hip         [facility] PT Assessment - 01/24/18 1030      Assessment   Medical Diagnosis  Other intervertebral disc degeneration, lumbar region    Referring Provider (PT)  Darla Lesches Mayo, PA-C    Onset Date/Surgical Date  12/25/17    Next MD Visit  02/11/18    Prior Therapy  Yes- for back      Precautions   Precautions  --   osteoporosis      Balance Screen   Has the patient fallen in the past 6 months  No    Has the patient had a decrease in activity level because of a fear of falling?   No    Is the patient reluctant to leave their home because of a fear of falling?   No      Home Environment   Living Environment  Private residence    Living Arrangements  Spouse/significant other    Type of [place] to enter    Entrance Stairs-Number of Steps  2    [place] to live on main level with bedroom/bathroom      Prior Function   Level of Independence  Independent    Vocation  Retired    Leisure  none      Cognition   Overall Cognitive Status  Within Functional Limits for tasks assessed      Observation/Other Assessments   Focus on Therapeutic Outcomes (FOTO)   Lumbar: 52 (48% limited, 35% predicted)      Sensation   Light Touch  Appears Intact      Coordination   Gross Motor Movements are Fluid and Coordinated  Yes      Posture/Postural Control   Posture/Postural Control  Postural limitations    Postural Limitations  Rounded Shoulders;Forward head      ROM / Strength   AROM / PROM / Strength  AROM;Strength;PROM      AROM   AROM Assessment Site  Lumbar    Right/Left Hip  --    Lumbar Flexion  midshin   mild R hip/back pain   Lumbar Extension  moderately limited    severe pain in R LB   Lumbar - Right Side Bend  mid thigh   moderate R hip pain   Lumbar - Left Side Bend  mid thigh    Lumbar - Right Rotation  mildly limited    Lumbar - Left Rotation  midly limited      PROM   PROM Assessment Site  Hip    Right/Left Hip  --   R & L hip PROM WFL and nonpainful     Strength   Strength Assessment Site  Hip;Knee;Ankle    Right/Left Hip  Right;Left    Right Hip Flexion  4-/5   pain in hip crease   Right Hip ABduction  4+/5  Right Hip ADduction  4+/5    Left Hip Flexion  4/5    Left Hip ABduction  4+/5    Left Hip ADduction  4+/5     Right/Left Knee  Right;Left    Right Knee Flexion  4/5    Right Knee Extension  4/5    Left Knee Flexion  4+/5    Left Knee Extension  4+/5    Right/Left Ankle  Right;Left    Right Ankle Dorsiflexion  4+/5    Right Ankle Plantar Flexion  4+/5    Left Ankle Dorsiflexion  4/5    Left Ankle Plantar Flexion  4+/5      Flexibility   Soft Tissue Assessment /Muscle Length  yes    Hamstrings  B severely tight    Quadriceps  mod thomas B severely tight      Special Tests    Special Tests  Hip Special Tests    Hip Special Tests   Hip Scouring      Hip Scouring   Findings  --   "muscle stretch" in R LB     Ambulation/Gait   Gait Pattern  Step-through pattern;Decreased step length - right;Decreased step length - left;Decreased weight shift to right                Objective measurements completed on examination: See above findings.              PT Education - 01/24/18 1111    Education Details  prognosis, POC, HEP    Person(s) Educated  Patient    Methods  Explanation;Demonstration;Tactile cues;Verbal cues;Handout    Comprehension  Verbalized understanding;Returned demonstration       PT Short Term Goals - 01/24/18 1119      PT SHORT TERM GOAL #1   Title  Patient to be independent with initial HEP.    Time  3    Period  Weeks    Status  New    Target Date  02/14/18        PT Long Term Goals - 01/24/18 1119      PT LONG TERM GOAL #1   Title  Patient to be independent with advanced HEP.    Time  6    Period  Weeks    Status  New    Target Date  03/07/18      PT LONG TERM GOAL #2   Title  Patient to demonstrate [facility] and pain-free lumbar AROM.    Time  6    Period  Weeks    Status  New    Target Date  03/07/18      PT LONG TERM GOAL #3   Title  Patient to demonstrate >=4+/5 strength in B LEs.    Time  6    Period  Weeks    Status  New    Target Date  03/07/18      PT LONG TERM GOAL #4   Title  Patient to demonstrate mild tightness in B HS,  hip flexors, quads on B LEs.    Time  6    Period  Weeks    Status  New    Target Date  03/07/18      PT LONG TERM GOAL #5   Title  Patient to report tolerance of 1 hour of walking without pain limiting.     Time  6    Period  Weeks    Status  New  Target Date  03/07/18             Plan - 01/24/18 1112    Clinical Impression Statement  Patient is a 72y/o F presenting to OPPT with c/o R sided LBP radiating to R anterior hip of 1 month duration with insidious onset. Aggravating factors include: standing rotation, standing, sitting, pressure of waistband on hip. Reports good benefit from prednisone pack but symptoms have returned since stopping this medication; notes current symptoms are less severe than before taking meds. Patient today with limited and painful lumbar ROM, decreased and painful proximal hip strength, painless and WFL hip PROM, negative scour test, severely decreased hamstring, hip flexor, and quad flexibility. Educated on and received handout on gentle stretching and core strengthening HEP. Reported understanding. Would benefit from skilled PT services 2x/week for 1 week and additional 1x/week for 5 weeks to address aforementioned impairments.     Clinical Presentation  Stable    Clinical Decision Making  Low    Rehab Potential  Good    Clinical Impairments Affecting Rehab Potential  wrist fx, pleuritic chest pain, osteoporosis, migraine, hypothyroidism, glaucoma, fibromyalgia, R RTC repair    PT Frequency  Other (comment)   2x/wk for 1 wk, 1x/wk for 5 wks   PT Duration  6 weeks    PT Treatment/Interventions  ADLs/Self Care Home Management;Cryotherapy;Electrical Stimulation;Moist Heat;Ultrasound;DME Instruction;Gait training;Stair training;Functional mobility training;Therapeutic activities;Therapeutic exercise;Manual techniques;Patient/family education;Neuromuscular re-education;Balance training;Passive range of motion;Energy conservation;Dry needling;Splinting;Taping     PT Next Visit Plan  reassess HEP    Consulted and Agree with Plan of Care  Patient       Patient will benefit from skilled therapeutic intervention in order to improve the following deficits and impairments:  Hypomobility, Decreased activity tolerance, Decreased strength, Pain, Difficulty walking, Improper body mechanics, Decreased range of motion, Impaired flexibility, Postural dysfunction  Visit Diagnosis: Acute right-sided low back pain, unspecified whether sciatica present  Other symptoms and signs involving the musculoskeletal system  Muscle weakness (generalized)  Difficulty in walking, not elsewhere classified     Problem List Patient Active Problem List   Diagnosis Date Noted  . Acute pain of left wrist 08/31/2017  . Closed fracture of distal end of radius 04/16/2017  . Chronic back pain 03/15/2017  . PCP NOTES >>>>>>>>>>>>>>>>>>>> 05/24/2015  . Numbness and tingling of right arm 05/22/2015  . Pain in the chest   . Chest pain 05/21/2015  . Insomnia 09/01/2014  . GERD (gastroesophageal reflux disease) 03/27/2013  . Dysphagia, pharyngoesophageal phase 03/27/2013  . Annual physical exam 06/12/2011  . Sinusitis, chronic 02/02/2011  . Osteoporosis 06/08/2010  . Fibromyalgia, pain mngmt 01/29/2009  . LIPOMA 09/30/2008  . Migraine without aura 03/04/2007  . Hypothyroidism 05/15/2006  . COLONIC POLYPS, HX OF 05/15/2006    Janene Harvey, PT, DPT 01/24/18 11:25 AM   [facility] [address]  [place], [place], [address] Phone: [phone]   Fax:  [phone]  Name: [name] MRN: [id number] Date of Birth: 1945/10/23

## 2018-01-29 ENCOUNTER — Encounter: Payer: Self-pay | Admitting: Physical Therapy

## 2018-01-29 ENCOUNTER — Ambulatory Visit: Payer: Medicare HMO | Admitting: Physical Therapy

## 2018-01-29 DIAGNOSIS — M6281 Muscle weakness (generalized): Secondary | ICD-10-CM | POA: Diagnosis not present

## 2018-01-29 DIAGNOSIS — R262 Difficulty in walking, not elsewhere classified: Secondary | ICD-10-CM | POA: Diagnosis not present

## 2018-01-29 DIAGNOSIS — R29898 Other symptoms and signs involving the musculoskeletal system: Secondary | ICD-10-CM | POA: Diagnosis not present

## 2018-01-29 DIAGNOSIS — M545 Low back pain, unspecified: Secondary | ICD-10-CM

## 2018-01-29 NOTE — Therapy (Addendum)
[facility] [address]  [place], [place], [address] Phone: [phone]   Fax:  [phone]  Physical Therapy Treatment  Patient Details  Name: [name] MRN: [id number] Date of Birth: Mar 30, 1946 Referring Provider (PT): [facility] [place]   Progress Note Reporting Period 01/24/18 to 01/29/18  See note below for Objective Data and Assessment of Progress/Goals.    Encounter Date: 01/29/2018  PT End of Session - 01/29/18 1147    Visit Number  2    Number of Visits  8    Date for PT Re-Evaluation  03/07/18    Authorization Type  [employer]    PT Start Time  1104    PT Stop Time  1145    PT Time Calculation (min)  41 min    Activity Tolerance  Patient tolerated treatment well    Behavior During Therapy  WFL for tasks assessed/performed       Past Medical History:  Diagnosis Date  . Cataract    both eyes rt. eye was removed  . Chest pain    stress test @ [facility] cardiology (-)  . Colonic polyp    bx adenomatous polyps, next scope 2010  . Fibromyalgia    see rheumatology  . Glaucoma    not glaucoma surgically removed growths on cornea  . Hypothyroidism   . Migraine   . 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 Osteoporosis    osteopenia  . Pleuritic chest pain    etiology unclear, has beeb eval by pulmonary and rheumatology (autoimmune dz?)  . Wrist fracture 2018    Past Surgical History:  Procedure Laterality Date  . COLONOSCOPY    . ESOPHAGOGASTRODUODENOSCOPY (EGD) WITH PROPOFOL N/A 03/27/2013   Procedure: ESOPHAGOGASTRODUODENOSCOPY (EGD) WITH PROPOFOL;  Surgeon: Milus Banister, MD;  Location: [facility]OSCOPY;  Service: Endoscopy;  Laterality: N/A;  possible dil  . 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  . plastic surgery face  2013  . POLYPECTOMY    . right rotator cuff repair  2010  . TONSILLECTOMY      There were no vitals filed for this visit.  Subjective Assessment -  01/29/18 1106    Subjective  Reports she feels a little better since last session. Reports compliance with HEP 3 days since last session.     Pertinent History  wrist fx, pleuritic chest pain, osteoporosis, migraine, hypothyroidism, glaucoma, fibromyalgia, R RTC repair    Diagnostic tests  01/10/18 L hip xray: "minimal degenerative change about the left hip. I do not think this is symptomatic. There were some degenerative changes at the very last level of the lumbar spine onset and that one AP view which could be the cause of her lateral left hip pain."    Patient Stated Goals  "getting rid of this (pain)"    Currently in Pain?  Yes    Pain Score  4     Pain Location  Groin    Pain Orientation  Right    Pain Descriptors / Indicators  Aching;Constant    Pain Type  Acute pain                       [facility] Adult PT Treatment/Exercise - 01/29/18 0001      Exercises   Exercises  Lumbar;Knee/Hip      Lumbar Exercises: Stretches   Passive Hamstring Stretch  Right;Left;1 rep;30 seconds;Limitations  c/o cramping in R TFL/lateral hip   Passive Hamstring Stretch Limitations  supine strap    ITB Stretch  Right;Left;1 rep;30 seconds;Limitations    ITB Stretch Limitations  supine strap; to tolerance      Lumbar Exercises: Aerobic   Recumbent Bike  L1 x 78mn      Lumbar Exercises: Supine   Ab Set  10 reps;Limitations    AB Set Limitations  TrA contraction with instruction and assistance palpating; 10x10"    Pelvic Tilt  10 reps;Limitations    Pelvic Tilt Limitations  cues for rhythmic breathing    Bridge  10 reps;Limitations    Bridge with Axel Squeeze  10 reps;Limitations    Bridge with BCardinal HealthLimitations  cues for TrA contraction    Basic Lumbar Stabilization  10 reps;Limitations    Basic Lumbar Stabilization Limitations  hooklying overhead yellow medball lift    cues to maintain posterior pelvic tilit and avoid compensati   Other Supine Lumbar Exercises  windshield  wipers x20 to tolerance      Manual Therapy   Manual Therapy  Soft tissue mobilization;Muscle Energy Technique    Soft tissue mobilization  STM to R glute, piriformis, lumbar parapsinals, TFL, hip flexor- tenderness and restriction in glute, TFL, and piriformis    Muscle Energy Technique  manual TPR to R glute, piriformis             PT Education - 01/29/18 1147    Education Details  update to HEP    Person(s) Educated  Patient    Methods  Explanation;Demonstration;Tactile cues;Verbal cues;Handout    Comprehension  Verbalized understanding;Returned demonstration       PT Short Term Goals - 01/29/18 1151      PT SHORT TERM GOAL #1   Title  Patient to be independent with initial HEP.    Time  3    Period  Weeks    Status  On-going        PT Long Term Goals - 01/29/18 1151      PT LONG TERM GOAL #1   Title  Patient to be independent with advanced HEP.    Time  6    Period  Weeks    Status  On-going      PT LONG TERM GOAL #2   Title  Patient to demonstrate [facility] pain-free lumbar AROM.    Time  6    Period  Weeks    Status  On-going      PT LONG TERM GOAL #3   Title  Patient to demonstrate >=4+/5 strength in B LEs.    Time  6    Period  Weeks    Status  On-going      PT LONG TERM GOAL #4   Title  Patient to demonstrate mild tightness in B HS, hip flexors, quads on B LEs.    Time  6    Period  Weeks    Status  On-going      PT LONG TERM GOAL #5   Title  Patient to report tolerance of 1 hour of walking without pain limiting.     Time  6    Period  Weeks    Status  On-going            Plan - 01/29/18 1147    Clinical Impression Statement  Patient arrived to session with report of intermittent compliance with HEP d/t being busy. Tolerated STM to R buttock, lateral, and  anterior hip with most tenderness and soft tissue restriction in R glute and piriformis. Reviewed and instructed patient on correct TrA contraction and palpation of muscle. Patient  reporting good understanding by end of activity. Introduced pelvic tilts with more difficulty with anterior pelvic tilt, however noting ease with posterior tilt. Worked on maintaining posterior pelvic tilt with abdominal strengthening exercises with cues required to correct form. Patient reporting R lateral hip muscle spasm with HS stretch which was eased with TFL stretch. Updated HEP to include pelvic tilts and TFL stretch- patient reported understanding. No complaints at end of session.     Clinical Impairments Affecting Rehab Potential  wrist fx, pleuritic chest pain, osteoporosis, migraine, hypothyroidism, glaucoma, fibromyalgia, R RTC repair    PT Treatment/Interventions  ADLs/Self Care Home Management;Cryotherapy;Electrical Stimulation;Moist Heat;Ultrasound;DME Instruction;Gait training;Stair training;Functional mobility training;Therapeutic activities;Therapeutic exercise;Manual techniques;Patient/family education;Neuromuscular re-education;Balance training;Passive range of motion;Energy conservation;Dry needling;Splinting;Taping    Consulted and Agree with Plan of Care  Patient       Patient will benefit from skilled therapeutic intervention in order to improve the following deficits and impairments:  Hypomobility, Decreased activity tolerance, Decreased strength, Pain, Difficulty walking, Improper body mechanics, Decreased range of motion, Impaired flexibility, Postural dysfunction  Visit Diagnosis: Acute right-sided low back pain, unspecified whether sciatica present  Other symptoms and signs involving the musculoskeletal system  Muscle weakness (generalized)  Difficulty in walking, not elsewhere classified     Problem List Patient Active Problem List   Diagnosis Date Noted  . Acute pain of left wrist 08/31/2017  . Closed fracture of distal end of radius 04/16/2017  . Chronic back pain 03/15/2017  . PCP NOTES >>>>>>>>>>>>>>>>>>>> 05/24/2015  . Numbness and tingling of right arm  05/22/2015  . Pain in the chest   . Chest pain 05/21/2015  . Insomnia 09/01/2014  . GERD (gastroesophageal reflux disease) 03/27/2013  . Dysphagia, pharyngoesophageal phase 03/27/2013  . Annual physical exam 06/12/2011  . Sinusitis, chronic 02/02/2011  . Osteoporosis 06/08/2010  . Fibromyalgia, pain mngmt 01/29/2009  . LIPOMA 09/30/2008  . Migraine without aura 03/04/2007  . Hypothyroidism 05/15/2006  . COLONIC POLYPS, HX OF 05/15/2006    PHYSICAL THERAPY DISCHARGE SUMMARY  Visits from Start of Care: 2  Current functional level related to goals / functional outcomes: Unable to assess; patient requested to be D/C'd to return to MD for MRI   Remaining deficits: Unable to assess   Education / Equipment: HEP  Plan: Patient agrees to discharge.  Patient goals were not met. Patient is being discharged due to the patient's request.  ?????     Janene Harvey, PT, DPT 03/04/18 1:17 PM    [facility] [address]  [place], [place], [address] Phone: [phone]   Fax:  [phone]  Name: HULDA REDDIX MRN: [id number] Date of Birth: March 04, 1946

## 2018-02-01 ENCOUNTER — Ambulatory Visit: Payer: Medicare HMO

## 2018-02-05 ENCOUNTER — Encounter: Payer: Medicare HMO | Admitting: Physical Therapy

## 2018-02-05 DIAGNOSIS — H04123 Dry eye syndrome of bilateral lacrimal glands: Secondary | ICD-10-CM | POA: Diagnosis not present

## 2018-02-11 DIAGNOSIS — M48061 Spinal stenosis, lumbar region without neurogenic claudication: Secondary | ICD-10-CM | POA: Diagnosis not present

## 2018-02-11 DIAGNOSIS — M545 Low back pain: Secondary | ICD-10-CM | POA: Diagnosis not present

## 2018-02-11 DIAGNOSIS — M48062 Spinal stenosis, lumbar region with neurogenic claudication: Secondary | ICD-10-CM | POA: Diagnosis not present

## 2018-02-12 ENCOUNTER — Ambulatory Visit: Payer: Medicare HMO

## 2018-02-18 ENCOUNTER — Encounter: Payer: Medicare HMO | Admitting: Physical Therapy

## 2018-02-20 ENCOUNTER — Ambulatory Visit (HOSPITAL_BASED_OUTPATIENT_CLINIC_OR_DEPARTMENT_OTHER)
Admission: RE | Admit: 2018-02-20 | Discharge: 2018-02-20 | Disposition: A | Discharge status: Home / Self Care | Payer: Medicare HMO | Source: Ambulatory Visit | Attending: Obstetrics and Gynecology | Admitting: Obstetrics and Gynecology

## 2018-02-20 DIAGNOSIS — Z1231 Encounter for screening mammogram for malignant neoplasm of breast: Secondary | ICD-10-CM

## 2018-02-20 DIAGNOSIS — M48061 Spinal stenosis, lumbar region without neurogenic claudication: Secondary | ICD-10-CM | POA: Diagnosis not present

## 2018-02-26 DIAGNOSIS — H04123 Dry eye syndrome of bilateral lacrimal glands: Secondary | ICD-10-CM | POA: Diagnosis not present

## 2018-03-04 DIAGNOSIS — M48061 Spinal stenosis, lumbar region without neurogenic claudication: Secondary | ICD-10-CM | POA: Diagnosis not present

## 2018-03-05 ENCOUNTER — Encounter: Payer: Medicare HMO | Admitting: Physical Therapy

## 2018-03-13 ENCOUNTER — Encounter: Payer: Self-pay | Admitting: Internal Medicine

## 2018-03-13 ENCOUNTER — Ambulatory Visit (INDEPENDENT_AMBULATORY_CARE_PROVIDER_SITE_OTHER): Payer: Medicare HMO | Admitting: Internal Medicine

## 2018-03-13 VITALS — BP 126/68 | HR 69 | Temp 97.8°F | Resp 16 | Ht 65.0 in | Wt 185.1 lb

## 2018-03-13 DIAGNOSIS — G4700 Insomnia, unspecified: Secondary | ICD-10-CM | POA: Diagnosis not present

## 2018-03-13 DIAGNOSIS — G8929 Other chronic pain: Secondary | ICD-10-CM | POA: Diagnosis not present

## 2018-03-13 DIAGNOSIS — Z79899 Other long term (current) drug therapy: Secondary | ICD-10-CM | POA: Diagnosis not present

## 2018-03-13 DIAGNOSIS — E039 Hypothyroidism, unspecified: Secondary | ICD-10-CM | POA: Diagnosis not present

## 2018-03-13 DIAGNOSIS — M549 Dorsalgia, unspecified: Secondary | ICD-10-CM

## 2018-03-13 LAB — TSH: TSH: 1.43 u[IU]/mL (ref 0.35–4.50)

## 2018-03-13 NOTE — Progress Notes (Signed)
Subjective:    Patient ID: [name], female    DOB: 23-Aug-1945, 72 y.o.   MRN: [id number]  DOS:  03/13/2018 Type of visit - description : rov Since the last office visit, she had more back pain, went to see Dr. Rolena Infante, had an MRI, report reviewed. Good compliance with all medications   Review of Systems Denies chest pain or difficulty breathing No nausea, vomiting, diarrhea Denies depression or anxiety at this time  Past Medical History:  Diagnosis Date  . Cataract    both eyes rt. eye was removed  . Chest pain    stress test @ [facility] cardiology (-)  . Colonic polyp    bx adenomatous polyps, next scope 2010  . Fibromyalgia    see rheumatology  . Glaucoma    not glaucoma surgically removed growths on cornea  . Hypothyroidism   . Migraine   . 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 Osteoporosis    osteopenia  . Pleuritic chest pain    etiology unclear, has beeb eval by pulmonary and rheumatology (autoimmune dz?)  . Wrist fracture 2018    Past Surgical History:  Procedure Laterality Date  . ESOPHAGOGASTRODUODENOSCOPY (EGD) WITH PROPOFOL N/A 03/27/2013   Procedure: ESOPHAGOGASTRODUODENOSCOPY (EGD) WITH PROPOFOL;  Surgeon: Milus Banister, MD;  Location: [facility];  Service: Endoscopy;  Laterality: N/A;  possible dil  . 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  . plastic surgery face  2013  . POLYPECTOMY    . right rotator cuff repair  2010  . TONSILLECTOMY      Social History   Socioeconomic History  . Marital status: Married    Spouse name: Not on file  . Number of children: 0  . Years of education: Not on file  . Highest education level: Not on file  Occupational History  . Occupation: retired, 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  Social Needs  . Financial resource strain: Not on file  . Food insecurity:    Worry: Not on file    Inability: Not on file  . Transportation needs:    Medical: Not on file    Non-medical: Not on file  Tobacco Use  .  Smoking status: Former Smoker    Packs/day: 1.00    Years: 38.50    Pack years: 38.50    Types: Cigarettes    Last attempt to quit: 01/13/2004    Years since quitting: 14.1  . Smokeless tobacco: Never Used  . Tobacco comment: quit 2005  Substance and Sexual Activity  . Alcohol use: No    Alcohol/week: 0.0 standard drinks    Comment: rare  . Drug use: No  . Sexual activity: Not Currently  Lifestyle  . Physical activity:    Days per week: Not on file    Minutes per session: Not on file  . Stress: Not on file  Relationships  . Social connections:    Talks on phone: Not on file    Gets together: Not on file    Attends religious service: Not on file    Active member of club or organization: Not on file    Attends meetings of clubs or organizations: Not on file    Relationship status: Not on file  . Intimate partner violence:    Fear of current or ex partner: Not on file    Emotionally abused: Not on file    Physically abused: Not on file  Forced sexual activity: Not on file  Other Topics Concern  . Not on file  Social History Narrative   No biological children         Allergies as of 03/13/2018      Reactions   Alendronate Sodium    REACTION: urticaria (hives)   Bactrim [sulfamethoxazole-trimethoprim]    Upset stomach   Contrast Media [iodinated Diagnostic Agents] Hives   Milnacipran    REACTION: increased bp, increased hr   Penicillins    REACTION: urticaria (hives)      Medication List        Accurate as of 03/13/18 11:59 PM. Always use your most recent med list.          ALPRAZolam 0.5 MG tablet Commonly known as:  XANAX TAKE 1 TABLET BY MOUTH AT BEDTIME AS NEEDED FOR ANXIETY.   calcium carbonate 1250 (500 Ca) MG chewable tablet Commonly known as:  OS-CAL Chew 1 tablet by mouth daily.   conjugated estrogens vaginal cream Commonly known as:  PREMARIN Place 1/2 gram per vagina at bedtime twice a week.   denosumab 60 MG/ML Sosy injection Commonly  known as:  PROLIA Inject 60 mg into the skin every 6 (six) months. Administer in upper arm, thigh, or abdomen   HYDROcodone-acetaminophen 5-325 MG tablet Commonly known as:  NORCO/VICODIN Take 1 tablet by mouth every 6 (six) hours as needed for severe pain.   levothyroxine 88 MCG tablet Commonly known as:  SYNTHROID, LEVOTHROID Take 1 tablet (88 mcg total) by mouth daily before breakfast.   multivitamin capsule Take 1 capsule by mouth daily.   polycarbophil 625 MG tablet Commonly known as:  FIBERCON Take 625 mg by mouth daily.   RESTASIS 0.05 % ophthalmic emulsion Generic drug:  cycloSPORINE Restasis 0.05 % eye drops in a dropperette   Vitamin D3 25 MCG (1000 UT) Caps Take 1,200 Units by mouth.           Objective:   Physical Exam BP 126/68 (BP Location: Left Arm, Patient Position: Sitting, Cuff Size: Small)   Pulse 69   Temp 97.8 F (36.6 C) (Oral)   Resp 16   Ht 5\' 5"  (1.651 m)   Wt 185 lb 2 oz (84 kg)   LMP 04/10/1998 (Approximate)   SpO2 98%   BMI 30.81 kg/m  General:   Well developed, NAD, BMI noted. HEENT:  Normocephalic . Face symmetric, atraumatic Lungs:  CTA B Normal respiratory effort, no intercostal retractions, no accessory muscle use. Heart: RRR,  no murmur.  No pretibial edema bilaterally  Skin: Not pale. Not jaundice Neurologic:  alert & oriented X3.  Speech normal, gait limited by back pain.  Unassisted. Psych--  Cognition and judgment appear intact.  Cooperative with normal attention span and concentration.  Behavior appropriate. No anxious or depressed appearing.      Assessment & Plan:     Assessment Hypothyroidism GERD Migraines insmonia-- xanax prn MSK- Pain mngmt  -- back pain, 2017 had a MRI, saw Dr Durward Fortes, Rx local injection, PT --History of fibromyalgia --H/o Pleuritic chest pain, on-off, resolved  Osteopenia:  Per gyn as off 2019 --T score 2011   -2.6 (intolerant to fosamax, ok w/ Boniva x years) --T score  01-2013   -2.6 --T score 02-2015  -2.3, rx ca and cit D  Glaucoma Ocular Rosacea 2010 Lung cancer screening CT 09-2015 CP admitted, stress lest low risk 05-2015   PLAN: Hypothyroidism: On Synthroid, check a TSH.  . Insomnia: On Xanax, check  a UDS. Chronic back pain: Recently worse, saw Dr. Rolena Infante, did physical therapy, it made the pain worse.  Had an MRI 02/20/2018: DJD, spinal stenosis, old compression fracture, left renal cyst (seen also in a 2007 CT). Previously I encouraged hydrocodone BID, at this time she needs it TID, check a UDS and contract.  Voltaren gel did not help.  Dr. Rolena Infante is planning a local injection, per note review, surgery if that fails   RTC 6 months CPX

## 2018-03-13 NOTE — Progress Notes (Signed)
Pre visit review using our clinic review tool, if applicable. No additional management support is needed unless otherwise documented below in the visit note. 

## 2018-03-13 NOTE — Patient Instructions (Addendum)
Please schedule Medicare Wellness with [name].   GO TO THE LAB : Get the blood work  and provide a urine sample    GO TO THE FRONT DESK Schedule your next appointment for a  Physical in 6 months

## 2018-03-14 DIAGNOSIS — H04123 Dry eye syndrome of bilateral lacrimal glands: Secondary | ICD-10-CM | POA: Diagnosis not present

## 2018-03-14 NOTE — Assessment & Plan Note (Signed)
Hypothyroidism: On Synthroid, check a TSH.  . Insomnia: On Xanax, check a UDS. Chronic back pain: Recently worse, saw Dr. Rolena Infante, did physical therapy, it made the pain worse.  Had an MRI 02/20/2018: DJD, spinal stenosis, old compression fracture, left renal cyst (seen also in a 2007 CT). Previously I encouraged hydrocodone BID, at this time she needs it TID, check a UDS and contract.  Voltaren gel did not help.  Dr. Rolena Infante is planning a local injection, per note review, surgery if that fails   RTC 6 months CPX

## 2018-03-17 LAB — PAIN MGMT, PROFILE 8 W/CONF, U
6 Acetylmorphine: NEGATIVE ng/mL (ref <10)
ALCOHOL METABOLITES: NEGATIVE ng/mL (ref <500)
AMPHETAMINES: NEGATIVE ng/mL (ref <500)
BENZODIAZEPINES: NEGATIVE ng/mL (ref <100)
Buprenorphine, Urine: NEGATIVE ng/mL (ref <5)
CREATININE: 27.3 mg/dL
Cocaine Metabolite: NEGATIVE ng/mL (ref <150)
Codeine: NEGATIVE ng/mL (ref <50)
HYDROMORPHONE: 70 ng/mL — AB (ref <50)
Hydrocodone: 97 ng/mL — ABNORMAL HIGH (ref <50)
MDMA: NEGATIVE ng/mL (ref <500)
MORPHINE: NEGATIVE ng/mL (ref <50)
Marijuana Metabolite: NEGATIVE ng/mL (ref <20)
Norhydrocodone: 296 ng/mL — ABNORMAL HIGH (ref <50)
OXIDANT: NEGATIVE ug/mL (ref <200)
Opiates: POSITIVE ng/mL — AB (ref <100)
Oxycodone: NEGATIVE ng/mL (ref <100)
PH: 6.99 (ref 4.5–9.0)

## 2018-04-12 DIAGNOSIS — H0279 Other degenerative disorders of eyelid and periocular area: Secondary | ICD-10-CM | POA: Diagnosis not present

## 2018-04-12 DIAGNOSIS — H02831 Dermatochalasis of right upper eyelid: Secondary | ICD-10-CM | POA: Diagnosis not present

## 2018-04-12 DIAGNOSIS — H57813 Brow ptosis, bilateral: Secondary | ICD-10-CM | POA: Diagnosis not present

## 2018-04-12 DIAGNOSIS — H02422 Myogenic ptosis of left eyelid: Secondary | ICD-10-CM | POA: Diagnosis not present

## 2018-04-12 DIAGNOSIS — H02834 Dermatochalasis of left upper eyelid: Secondary | ICD-10-CM | POA: Diagnosis not present

## 2018-04-12 DIAGNOSIS — H02413 Mechanical ptosis of bilateral eyelids: Secondary | ICD-10-CM | POA: Diagnosis not present

## 2018-04-12 DIAGNOSIS — H53483 Generalized contraction of visual field, bilateral: Secondary | ICD-10-CM | POA: Diagnosis not present

## 2018-04-12 HISTORY — PX: COSMETIC SURGERY: SHX468

## 2018-04-22 ENCOUNTER — Telehealth: Payer: Self-pay | Admitting: Obstetrics and Gynecology

## 2018-04-22 NOTE — Telephone Encounter (Signed)
[facility] called and left a message after hours requesting a call back to coordinate delivery of Prolia for the patient.

## 2018-04-22 NOTE — Telephone Encounter (Signed)
Called [facility]. Unable to get through.

## 2018-04-22 NOTE — Telephone Encounter (Signed)
[place]. Their System is down.  To call back later.

## 2018-04-23 DIAGNOSIS — M48061 Spinal stenosis, lumbar region without neurogenic claudication: Secondary | ICD-10-CM | POA: Diagnosis not present

## 2018-04-23 NOTE — Telephone Encounter (Signed)
Call to [facility].  They state that patinet has to pay copay balance and then Prolia will be shipped.  They will call back to request ship date.

## 2018-04-24 ENCOUNTER — Telehealth: Payer: Self-pay | Admitting: Internal Medicine

## 2018-04-24 MED ORDER — HYDROCODONE-ACETAMINOPHEN 5-325 MG PO TABS: 1.0000 | ORAL_TABLET | Freq: Four times a day (QID) | ORAL | 0 refills | Status: DC | PRN

## 2018-04-24 NOTE — Addendum Note (Signed)
Addended byDamita Dunnings D on: 04/24/2018 11:18 AM   Modules accepted: Orders

## 2018-04-24 NOTE — Telephone Encounter (Signed)
Pt is requesting refill on hydrocodone.   Last OV: 03/13/2018 Last Fill: 01/22/2018 #270 and 0RF UDS: 03/13/2018 Low risk  [facility] in media 03/13/2018

## 2018-04-24 NOTE — Addendum Note (Signed)
Addended by: Kathlene November E on: 04/24/2018 03:29 PM   Modules accepted: Orders

## 2018-04-24 NOTE — Telephone Encounter (Signed)
Copied from [place] [phone]. Topic: Quick Communication - Rx Refill/Question >> Apr 24, 2018 10:57 AM Alanda Slim E wrote: Medication: HYDROcodone-acetaminophen (NORCO/VICODIN) 5-325 MG tablet (Pt only has 3 pills left)   Has the patient contacted their pharmacy? Yes (told to call provider)   Preferred Pharmacy (with phone number or street name): [id number] [place], [place] - [address] [phone] (Phone) [phone] (Fax)    Agent: Please be advised that RX refills may take up to 3 business days. We ask that you follow-up with your pharmacy.

## 2018-04-24 NOTE — Telephone Encounter (Signed)
sent 

## 2018-04-25 NOTE — Telephone Encounter (Signed)
Call to patient. Prolia will be dlievered tomorrwo 04/26/2018.

## 2018-04-28 ENCOUNTER — Other Ambulatory Visit: Payer: Self-pay | Admitting: Internal Medicine

## 2018-05-01 ENCOUNTER — Ambulatory Visit (INDEPENDENT_AMBULATORY_CARE_PROVIDER_SITE_OTHER): Payer: Medicare HMO

## 2018-05-01 VITALS — BP 122/64 | HR 66 | Resp 14 | Ht 65.0 in | Wt 184.0 lb

## 2018-05-01 DIAGNOSIS — M8580 Other specified disorders of bone density and structure, unspecified site: Secondary | ICD-10-CM

## 2018-05-01 MED ORDER — DENOSUMAB 60 MG/ML ~~LOC~~ SOSY
60.0000 mg | PREFILLED_SYRINGE | Freq: Once | SUBCUTANEOUS | Status: AC
  Administered 2018-05-01: 60 mg via SUBCUTANEOUS

## 2018-05-01 NOTE — Progress Notes (Signed)
Patient in today for third Prolia injection. Patient's initial calcium level was obtained on 04/26/17.  Result: 9.1.  Last AEX: 01/10/17 Last BMD: 02/15/17  Injection given in right arm.  Patient tolerated injection well.  Routed to provider for review.  Patient was here for nurse visit to have Prolia done. Was scheduled at office visit instead. Needs doctor to sign.

## 2018-05-07 ENCOUNTER — Ambulatory Visit (INDEPENDENT_AMBULATORY_CARE_PROVIDER_SITE_OTHER): Payer: Medicare HMO | Admitting: Family Medicine

## 2018-05-07 VITALS — BP 122/68 | HR 78 | Ht 65.0 in | Wt 186.0 lb

## 2018-05-07 DIAGNOSIS — H10022 Other mucopurulent conjunctivitis, left eye: Secondary | ICD-10-CM

## 2018-05-07 DIAGNOSIS — M48061 Spinal stenosis, lumbar region without neurogenic claudication: Secondary | ICD-10-CM | POA: Diagnosis not present

## 2018-05-07 DIAGNOSIS — M48062 Spinal stenosis, lumbar region with neurogenic claudication: Secondary | ICD-10-CM | POA: Diagnosis not present

## 2018-05-07 MED ORDER — MOXIFLOXACIN HCL 0.5 % OP SOLN: 1.0000 [drp] | Freq: Three times a day (TID) | OPHTHALMIC | 0 refills | Status: DC

## 2018-05-07 NOTE — Patient Instructions (Signed)
Bacterial Conjunctivitis, Adult  Bacterial conjunctivitis is an infection of your conjunctiva. This is the clear membrane that covers the white part of your eye and the inner part of your eyelid. This infection can make your eye:  · Red or pink.  · Itchy.  This condition spreads easily from person to person (is contagious) and from one eye to the other eye.  What are the causes?  · This condition is caused by germs (bacteria). You may get the infection if you come into close contact with:  ? A person who has the infection.  ? Items that have germs on them (are contaminated), such as face towels, contact lens solution, or eye makeup.  What increases the risk?  You are more likely to get this condition if you:  · Have contact with people who have the infection.  · Wear contact lenses.  · Have a sinus infection.  · Have had a recent eye injury or surgery.  · Have a weak body defense system (immune system).  · Have dry eyes.  What are the signs or symptoms?    · Thick, yellowish discharge from the eye.  · Tearing or watery eyes.  · Itchy eyes.  · Burning feeling in your eyes.  · Eye redness.  · Swollen eyelids.  · Blurred vision.  How is this treated?    · Antibiotic eye drops or ointment.  · Antibiotic medicine taken by mouth. This is used for infections that do not get better with drops or ointment or that last more than 10 days.  · Cool, wet cloths placed on the eyes.  · Artificial tears used 2-6 times a day.  Follow these instructions at home:  Medicines  · Take or apply your antibiotic medicine as told by your doctor. Do not stop taking or applying the antibiotic even if you start to feel better.  · Take or apply over-the-counter and prescription medicines only as told by your doctor.  · Do not touch your eyelid with the eye-drop bottle or the ointment tube.  Managing discomfort  · Wipe any fluid from your eye with a warm, wet washcloth or a cotton [place].  · Place a clean, cool, wet cloth on your eye. Do this for  10-20 minutes, 3-4 times per day.  General instructions  · Do not wear contacts until the infection is gone. Wear glasses until your doctor says it is okay to wear contacts again.  · Do not wear eye makeup until the infection is gone. Throw away old eye makeup.  · Change or wash your pillowcase every day.  · Do not share towels or washcloths.  · Wash your hands often with soap and water. Use paper towels to dry your hands.  · Do not touch or rub your eyes.  · Do not drive or use heavy machinery if your vision is blurred.  Contact a doctor if:  · You have a fever.  · You do not get better after 10 days.  Get help right away if:  · You have a fever and your symptoms get worse all of a sudden.  · You have very bad pain when you move your eye.  · Your face:  ? Hurts.  ? Is red.  ? Is swollen.  · You have sudden loss of vision.  Summary  · Bacterial conjunctivitis is an infection of your conjunctiva.  · This infection spreads easily from person to person.  · Wash your hands often   with soap and water. Use paper towels to dry your hands.  · Take or apply your antibiotic medicine as told by your doctor.  · Contact a doctor if you have a fever or you do not get better after 10 days.  This information is not intended to replace advice given to you by your health care provider. Make sure you discuss any questions you have with your health care provider.  Document Released: 01/04/2008 Document Revised: 10/31/2017 Document Reviewed: 10/31/2017  Elsevier Interactive Patient Education © 2019 [employer]

## 2018-05-07 NOTE — Progress Notes (Signed)
Patient ID: [name], female    DOB: [birth year]  Age: 73 y.o. MRN: [id number]    Subjective:  Subjective  HPI [name] presents for red eye-- no discharge, no pain, no itching   Review of Systems  Constitutional: Negative for appetite change, diaphoresis, fatigue and unexpected weight change.  Eyes: Positive for redness. Negative for pain and visual disturbance.  Respiratory: Negative for cough, chest tightness, shortness of breath and wheezing.   Cardiovascular: Negative for chest pain, palpitations and leg swelling.  Endocrine: Negative for cold intolerance, heat intolerance, polydipsia, polyphagia and polyuria.  Genitourinary: Negative for difficulty urinating, dysuria and frequency.  Neurological: Negative for dizziness, light-headedness, numbness and headaches.    History Past Medical History:  Diagnosis Date  . Cataract    both eyes rt. eye was removed  . Chest pain    stress test @ [facility] cardiology (-)  . Colonic polyp    bx adenomatous polyps, next scope 2010  . Fibromyalgia    see rheumatology  . Glaucoma    not glaucoma surgically removed growths on cornea  . Hypothyroidism   . Migraine   . 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 Osteoporosis    osteopenia  . Pleuritic chest pain    etiology unclear, has beeb eval by pulmonary and rheumatology (autoimmune dz?)  . Wrist fracture 2018    She has a past surgical history that includes Tonsillectomy; right rotator cuff repair (2010); Eye surgery; Esophagogastroduodenoscopy (egd) with propofol (N/A, 03/27/2013); plastic surgery face (2013); Polypectomy; and Cosmetic surgery (04/12/2018).   Her family history is not on file. She was adopted.She reports that she quit smoking about 14 years ago. Her smoking use included cigarettes. She has a 38.50 pack-year smoking history. She has never used smokeless tobacco. She reports that she does not drink alcohol or use drugs.  Current Outpatient Medications on File  Prior to Visit  Medication Sig Dispense Refill  . ALPRAZolam (XANAX) 0.5 MG tablet TAKE 1 TABLET BY MOUTH AT BEDTIME AS NEEDED FOR ANXIETY. 30 tablet 5  . calcium carbonate (OS-CAL) 1250 (500 CA) MG chewable tablet Chew 1 tablet by mouth daily.    . Cholecalciferol (VITAMIN D3) 1000 UNITS CAPS Take 1,200 Units by mouth.    . conjugated estrogens (PREMARIN) vaginal cream Place 1/2 gram per vagina at bedtime twice a week. 30 g 0  . cycloSPORINE (RESTASIS) 0.05 % ophthalmic emulsion Restasis 0.05 % eye drops in a dropperette    . denosumab (PROLIA) 60 MG/ML SOSY injection Inject 60 mg into the skin every 6 (six) months. Administer in upper arm, thigh, or abdomen 1 Syringe 1  . HYDROcodone-acetaminophen (NORCO/VICODIN) 5-325 MG tablet Take 1 tablet by mouth every 6 (six) hours as needed for severe pain. 270 tablet 0  . levothyroxine (SYNTHROID, LEVOTHROID) 88 MCG tablet Take 1 tablet (88 mcg total) by mouth daily before breakfast. 90 tablet 1  . Multiple Vitamin (MULTIVITAMIN) capsule Take 1 capsule by mouth daily.      . polycarbophil (FIBERCON) 625 MG tablet Take 625 mg by mouth daily.       No current facility-administered medications on file prior to visit.      Objective:  Objective  Physical Exam Vitals signs and nursing note reviewed.  Eyes:     General: Lids are normal. Lids are everted, no foreign bodies appreciated. Vision grossly intact.        Right eye: No foreign body, discharge or hordeolum.  Left eye: No foreign body, discharge or hordeolum.     Conjunctiva/sclera:     Right eye: Right conjunctiva is not injected. No chemosis, exudate or hemorrhage.    Left eye: Left conjunctiva is injected. No exudate.    BP 122/68   Pulse 78   Ht 5\' 5"  (1.651 m)   Wt 186 lb (84.4 kg)   LMP 04/10/1998 (Approximate)   SpO2 96%   BMI 30.95 kg/m  Wt Readings from Last 3 Encounters:  05/07/18 186 lb (84.4 kg)  05/01/18 184 lb (83.5 kg)  03/13/18 185 lb 2 oz (84 kg)     Lab  Results  Component Value Date   WBC 5.7 09/17/2017   HGB 13.3 09/17/2017   HCT 39.4 09/17/2017   PLT 246.0 09/17/2017   GLUCOSE 81 09/17/2017   CHOL 169 09/17/2017   TRIG 113.0 09/17/2017   HDL 44.00 09/17/2017   LDLDIRECT 92.0 03/04/2007   LDLCALC 102 (H) 09/17/2017   ALT 10 09/17/2017   AST 13 09/17/2017   NA 143 09/17/2017   K 4.0 09/17/2017   CL 106 09/17/2017   CREATININE 0.78 09/17/2017   BUN 15 09/17/2017   CO2 30 09/17/2017   TSH 1.43 03/13/2018   INR 0.98 05/22/2015   HGBA1C 5.5 05/22/2015    Mm 3d Screen Breast Bilateral  Result Date: 02/21/2018 CLINICAL DATA:  Screening. EXAM: DIGITAL SCREENING BILATERAL MAMMOGRAM WITH TOMO AND CAD COMPARISON:  Previous exam(s). ACR Breast Density Category b: There are scattered areas of fibroglandular density. FINDINGS: There are no findings suspicious for malignancy. Images were processed with CAD. IMPRESSION: No mammographic evidence of malignancy. A result letter of this screening mammogram will be mailed directly to the patient. RECOMMENDATION: Screening mammogram in one year. (Code:SM-B-01Y) BI-RADS CATEGORY  1: Negative. Electronically Signed   By: Claudie Revering M.D.   On: 02/21/2018 07:58     Assessment & Plan:  Plan  I am having Michelle Connors "[name]" start on moxifloxacin. I am also having her maintain her polycarbophil, multivitamin, calcium carbonate, Vitamin D3, conjugated estrogens, cycloSPORINE, ALPRAZolam, denosumab, HYDROcodone-acetaminophen, and levothyroxine.  Meds ordered this encounter  Medications  . moxifloxacin (VIGAMOX) 0.5 % ophthalmic solution    Sig: Place 1 drop into the left eye 3 (three) times daily.    Dispense:  3 mL    Refill:  0    Problem List Items Addressed This Visit    None    Visit Diagnoses    Pink eye disease of left eye    -  Primary   Relevant Medications   moxifloxacin (VIGAMOX) 0.5 % ophthalmic solution     cool compresses ,  Wash linens / towels etc If no better in 2-3 days  -- see opth  Follow-up: Return if symptoms worsen or fail to improve.  Ann Held, DO

## 2018-05-23 DIAGNOSIS — R69 Illness, unspecified: Secondary | ICD-10-CM | POA: Diagnosis not present

## 2018-05-23 DIAGNOSIS — M48061 Spinal stenosis, lumbar region without neurogenic claudication: Secondary | ICD-10-CM | POA: Diagnosis not present

## 2018-05-28 DIAGNOSIS — H04123 Dry eye syndrome of bilateral lacrimal glands: Secondary | ICD-10-CM | POA: Diagnosis not present

## 2018-05-28 DIAGNOSIS — H2513 Age-related nuclear cataract, bilateral: Secondary | ICD-10-CM | POA: Diagnosis not present

## 2018-05-28 DIAGNOSIS — Z0100 Encounter for examination of eyes and vision without abnormal findings: Secondary | ICD-10-CM | POA: Diagnosis not present

## 2018-05-31 DIAGNOSIS — R69 Illness, unspecified: Secondary | ICD-10-CM | POA: Diagnosis not present

## 2018-05-31 DIAGNOSIS — M792 Neuralgia and neuritis, unspecified: Secondary | ICD-10-CM | POA: Diagnosis not present

## 2018-05-31 DIAGNOSIS — Z79891 Long term (current) use of opiate analgesic: Secondary | ICD-10-CM | POA: Diagnosis not present

## 2018-05-31 DIAGNOSIS — E039 Hypothyroidism, unspecified: Secondary | ICD-10-CM | POA: Diagnosis not present

## 2018-05-31 DIAGNOSIS — Z888 Allergy status to other drugs, medicaments and biological substances status: Secondary | ICD-10-CM | POA: Diagnosis not present

## 2018-05-31 DIAGNOSIS — Z87891 Personal history of nicotine dependence: Secondary | ICD-10-CM | POA: Diagnosis not present

## 2018-05-31 DIAGNOSIS — G8929 Other chronic pain: Secondary | ICD-10-CM | POA: Diagnosis not present

## 2018-05-31 DIAGNOSIS — Z880 Allergy status to penicillin: Secondary | ICD-10-CM | POA: Diagnosis not present

## 2018-06-03 ENCOUNTER — Telehealth: Payer: Self-pay | Admitting: Internal Medicine

## 2018-06-03 NOTE — Telephone Encounter (Signed)
Pt is requesting refill on alprazolam.   Last OV: 03/13/2018 Last Fill: 11/06/2017 #30 and 5rf UDS: 03/13/2018 Low risk

## 2018-06-03 NOTE — Telephone Encounter (Signed)
Sent!

## 2018-06-04 DIAGNOSIS — R69 Illness, unspecified: Secondary | ICD-10-CM | POA: Diagnosis not present

## 2018-06-18 DIAGNOSIS — H04123 Dry eye syndrome of bilateral lacrimal glands: Secondary | ICD-10-CM | POA: Diagnosis not present

## 2018-06-25 DIAGNOSIS — M5416 Radiculopathy, lumbar region: Secondary | ICD-10-CM | POA: Diagnosis not present

## 2018-06-25 DIAGNOSIS — M48061 Spinal stenosis, lumbar region without neurogenic claudication: Secondary | ICD-10-CM | POA: Diagnosis not present

## 2018-07-08 ENCOUNTER — Other Ambulatory Visit: Payer: Self-pay | Admitting: Obstetrics and Gynecology

## 2018-07-08 ENCOUNTER — Telehealth: Payer: Self-pay | Admitting: Internal Medicine

## 2018-07-09 DIAGNOSIS — M48061 Spinal stenosis, lumbar region without neurogenic claudication: Secondary | ICD-10-CM | POA: Diagnosis not present

## 2018-07-09 DIAGNOSIS — M47816 Spondylosis without myelopathy or radiculopathy, lumbar region: Secondary | ICD-10-CM | POA: Diagnosis not present

## 2018-07-09 MED ORDER — HYDROCODONE-ACETAMINOPHEN 5-325 MG PO TABS: 1.0000 | ORAL_TABLET | Freq: Four times a day (QID) | ORAL | 0 refills | Status: DC | PRN

## 2018-07-09 MED ORDER — ESTROGENS, CONJUGATED 0.625 MG/GM VA CREA: TOPICAL_CREAM | VAGINAL | 0 refills | Status: DC

## 2018-07-09 NOTE — Telephone Encounter (Signed)
Pt is requesting refill on hydrocodone.   Last OV: 03/13/2018 Last Fill: 04/24/2018 #270 and 0RF (90 day supply) UDS: 03/13/2018 Low risk

## 2018-07-09 NOTE — Telephone Encounter (Signed)
Sent!

## 2018-07-11 ENCOUNTER — Telehealth: Payer: Self-pay | Admitting: Obstetrics and Gynecology

## 2018-07-11 NOTE — Telephone Encounter (Signed)
Spoke with Caryl Pina at [facility], confirmed premarin vaginal cream 1 tube 30g  dispensed for 90 days supply, unable to change to 30 day. Unable to use savings card.

## 2018-07-11 NOTE — Telephone Encounter (Signed)
Spoke with patient. Request 30 day supply of premarin vaginal or cream or new Rx for estrace vaginal cream as alternative. Advised I will send to Dr. Quincy Simmonds to review, our office will return call with recommendations. Patient agreeable.   Dr. Quincy Simmonds -please advise on Rx.

## 2018-07-11 NOTE — Telephone Encounter (Signed)
If she wishes to do compounded estrogen cream at a compounding pharmacy, this can be prescribed monthly. she may try vaginal estradiol cream 0.02%, apply 1/2 gm in vagina at hs 2 times per week.  Dispense:  8 gram tube (one month supply), Refills ok until annual exam.   If she wishes to switch to Vagifem, this can be prescribed monthly.  This would be Vagifem 10 mcg pv at hs x 2 weeks, then 1 pv at hs twice weekly.  Dispense:  18 tablets, refills of 8 tablets until annual exam.

## 2018-07-11 NOTE — Telephone Encounter (Signed)
Patient is calling regarding refill for Premarin. Patient is requesting that the prescription be resubmitted as a 30 day supply, as opposed to a 90 day supply due to cost.

## 2018-07-12 MED ORDER — ESTRADIOL 10 MCG VA TABS: ORAL_TABLET | VAGINAL | 5 refills | Status: DC

## 2018-07-12 NOTE — Telephone Encounter (Signed)
Spoke with patient. Patient would like to start on Vagifem. Rx for Estradiol 10 mcg(Vagifem) pv at hs x 2 weeks, then 1 pv at hs twice weekly #18 6RF with refills to be 8 tablets sent to pharmacy on file. Patient verbalizes understanding Encounter closed.

## 2018-07-12 NOTE — Telephone Encounter (Signed)
Left message to call Zuly Belkin at [phone]. 

## 2018-09-03 DIAGNOSIS — R69 Illness, unspecified: Secondary | ICD-10-CM | POA: Diagnosis not present

## 2018-09-09 DIAGNOSIS — M545 Low back pain: Secondary | ICD-10-CM | POA: Diagnosis not present

## 2018-09-09 DIAGNOSIS — M48061 Spinal stenosis, lumbar region without neurogenic claudication: Secondary | ICD-10-CM | POA: Diagnosis not present

## 2018-09-10 ENCOUNTER — Telehealth: Payer: Self-pay | Admitting: Obstetrics and Gynecology

## 2018-09-10 DIAGNOSIS — M8580 Other specified disorders of bone density and structure, unspecified site: Secondary | ICD-10-CM

## 2018-09-10 NOTE — Telephone Encounter (Signed)
Patient states she will be scheduling extensive back surgery later in the year with Dr.Brooks at [facility]. He knows of her history of osteopenia and Prolia. He would like to see results of next BMD prior to surgery--knows it is due 02/2019. Patient requesting an order sent to [place] at [facility]. Routing to [facility] for order.

## 2018-09-10 NOTE — Telephone Encounter (Signed)
Patient is having "major back surgery" and thinks she may need to have a BMD due to her osteopenia.

## 2018-09-11 NOTE — Telephone Encounter (Signed)
We are now referring patients to Endocrinology for management of osteoporosis/osteopenia being treated with Prolia.  I recommend she establish care with Endocrinology and have them order the test as they will be the new prescriber.  The other option is for her orthopedic doctor to order the test.

## 2018-09-11 NOTE — Telephone Encounter (Signed)
Spoke with patient. Advised as seen below per Dr. Quincy Simmonds. Patient agreeable with referral to endocrinology. Patient request referral to Dr. Cruzita Lederer at [facility], order placed. Advised their office will contact you directly to schedule. Patient verbalizes understanding and is agreeable.   Routing to provider for final review. Patient is agreeable to disposition. Will close encounter.  Cc: Magdalene Patricia

## 2018-09-18 DIAGNOSIS — H00012 Hordeolum externum right lower eyelid: Secondary | ICD-10-CM | POA: Diagnosis not present

## 2018-09-18 DIAGNOSIS — G8929 Other chronic pain: Secondary | ICD-10-CM | POA: Diagnosis not present

## 2018-09-24 DIAGNOSIS — G8929 Other chronic pain: Secondary | ICD-10-CM | POA: Diagnosis not present

## 2018-09-24 DIAGNOSIS — M48061 Spinal stenosis, lumbar region without neurogenic claudication: Secondary | ICD-10-CM | POA: Diagnosis not present

## 2018-09-30 ENCOUNTER — Other Ambulatory Visit: Payer: Self-pay

## 2018-09-30 ENCOUNTER — Ambulatory Visit (HOSPITAL_BASED_OUTPATIENT_CLINIC_OR_DEPARTMENT_OTHER)
Admission: RE | Admit: 2018-09-30 | Discharge: 2018-09-30 | Disposition: A | Discharge status: Home / Self Care | Payer: Medicare HMO | Source: Ambulatory Visit | Attending: Acute Care | Admitting: Acute Care

## 2018-09-30 ENCOUNTER — Encounter: Payer: Medicare HMO | Admitting: Internal Medicine

## 2018-09-30 DIAGNOSIS — Z87891 Personal history of nicotine dependence: Secondary | ICD-10-CM | POA: Diagnosis not present

## 2018-09-30 DIAGNOSIS — Z122 Encounter for screening for malignant neoplasm of respiratory organs: Secondary | ICD-10-CM

## 2018-10-02 ENCOUNTER — Encounter: Payer: Self-pay | Admitting: Internal Medicine

## 2018-10-02 ENCOUNTER — Ambulatory Visit (INDEPENDENT_AMBULATORY_CARE_PROVIDER_SITE_OTHER): Payer: Medicare HMO | Admitting: Internal Medicine

## 2018-10-02 ENCOUNTER — Other Ambulatory Visit: Payer: Self-pay

## 2018-10-02 VITALS — BP 123/79 | HR 64 | Temp 98.1°F | Resp 16 | Ht 65.0 in | Wt 185.2 lb

## 2018-10-02 DIAGNOSIS — I2510 Atherosclerotic heart disease of native coronary artery without angina pectoris: Secondary | ICD-10-CM

## 2018-10-02 DIAGNOSIS — M4800 Spinal stenosis, site unspecified: Secondary | ICD-10-CM | POA: Diagnosis not present

## 2018-10-02 DIAGNOSIS — Z0000 Encounter for general adult medical examination without abnormal findings: Secondary | ICD-10-CM

## 2018-10-02 DIAGNOSIS — E039 Hypothyroidism, unspecified: Secondary | ICD-10-CM | POA: Diagnosis not present

## 2018-10-02 DIAGNOSIS — G4700 Insomnia, unspecified: Secondary | ICD-10-CM

## 2018-10-02 DIAGNOSIS — I2583 Coronary atherosclerosis due to lipid rich plaque: Secondary | ICD-10-CM

## 2018-10-02 DIAGNOSIS — I7090 Unspecified atherosclerosis: Secondary | ICD-10-CM

## 2018-10-02 LAB — LIPID PANEL
Cholesterol: 171 mg/dL (ref 0–200)
HDL: 44.4 mg/dL (ref >39.00)
LDL Cholesterol: 96 mg/dL (ref 0–99)
NonHDL: 127.03
Total CHOL/HDL Ratio: 4
Triglycerides: 153 mg/dL — ABNORMAL HIGH (ref 0.0–149.0)
VLDL: 30.6 mg/dL (ref 0.0–40.0)

## 2018-10-02 LAB — CBC WITH DIFFERENTIAL/PLATELET
Basophils Absolute: 0.1 10*3/uL (ref 0.0–0.1)
Basophils Relative: 0.8 % (ref 0.0–3.0)
Eosinophils Absolute: 0.1 10*3/uL (ref 0.0–0.7)
Eosinophils Relative: 2.1 % (ref 0.0–5.0)
HCT: 40.7 % (ref 36.0–46.0)
Hemoglobin: 13.4 g/dL (ref 12.0–15.0)
Lymphocytes Relative: 25.5 % (ref 12.0–46.0)
Lymphs Abs: 1.6 10*3/uL (ref 0.7–4.0)
MCHC: 32.8 g/dL (ref 30.0–36.0)
MCV: 94.2 fl (ref 78.0–100.0)
Monocytes Absolute: 0.6 10*3/uL (ref 0.1–1.0)
Monocytes Relative: 10 % (ref 3.0–12.0)
Neutro Abs: 3.9 10*3/uL (ref 1.4–7.7)
Neutrophils Relative %: 61.6 % (ref 43.0–77.0)
Platelets: 246 10*3/uL (ref 150.0–400.0)
RBC: 4.32 Mil/uL (ref 3.87–5.11)
RDW: 13.2 % (ref 11.5–15.5)
WBC: 6.2 10*3/uL (ref 4.0–10.5)

## 2018-10-02 LAB — COMPREHENSIVE METABOLIC PANEL
ALT: 9 U/L (ref 0–35)
AST: 13 U/L (ref 0–37)
Albumin: 4 g/dL (ref 3.5–5.2)
Alkaline Phosphatase: 51 U/L (ref 39–117)
BUN: 12 mg/dL (ref 6–23)
CO2: 29 mEq/L (ref 19–32)
Calcium: 8.9 mg/dL (ref 8.4–10.5)
Chloride: 105 mEq/L (ref 96–112)
Creatinine, Ser: 0.73 mg/dL (ref 0.40–1.20)
GFR: 78.24 mL/min (ref >60.00)
Glucose, Bld: 85 mg/dL (ref 70–99)
Potassium: 4 mEq/L (ref 3.5–5.1)
Sodium: 141 mEq/L (ref 135–145)
Total Bilirubin: 0.5 mg/dL (ref 0.2–1.2)
Total Protein: 6.4 g/dL (ref 6.0–8.3)

## 2018-10-02 LAB — TSH: TSH: 1.07 u[IU]/mL (ref 0.35–4.50)

## 2018-10-02 MED ORDER — HYDROCODONE-ACETAMINOPHEN 7.5-300 MG PO TABS: 1.0000 | ORAL_TABLET | Freq: Three times a day (TID) | ORAL | 0 refills | Status: DC

## 2018-10-02 MED ORDER — SHINGRIX 50 MCG/0.5ML IM SUSR: 0.5000 mL | Freq: Once | INTRAMUSCULAR | 1 refills | Status: AC

## 2018-10-02 NOTE — Patient Instructions (Signed)
Get the blood work     [place] Schedule your next appointment for a follow-up in 6 months  We will schedule visit to [place] [facility]  We will schedule stress test    Schedule labs to be done    STOP BY THE FIRST FLOOR:  get the XR     Check the  blood pressure 2 or 3 times a month week monthly weekly daily Be sure your blood pressure is between 110/65 and  135/85. If it is consistently higher or lower, let me know   HOW TO TAKE YOUR BLOOD PRESSURE:   Rest 5 minutes before taking your blood pressure.   Don't smoke or drink caffeinated beverages for at least 30 minutes before.   Take your blood pressure before (not after) you eat.   Sit comfortably with your back supported and both feet on the floor (don't cross your legs).   Elevate your arm to heart level on a table or a desk.   Use the proper sized cuff. It should fit smoothly and snugly around your bare upper arm. There should be enough room to slip a fingertip under the cuff. The bottom edge of the cuff should be 1 inch above the crease of the elbow.   Ideally, take 3 measurements at one sitting and record the average.

## 2018-10-02 NOTE — Assessment & Plan Note (Addendum)
--  TD 08-2015; Pneumonia shot 2006 and 2013;  prevnar 2015; s/p zostavax ; shingrex rx printed ; rec early flu shot  --Cscope 11-2005:  polyps-adenomatous, repeated  colonoscopy 11/2008 , 04-2012, 04/2017, + polyps, 3 years -- Female care per gyn, [facility] 02-2018, PAP 12/2016  --Palpable aorta-- US neg for AAA 07-2012 --diet and exercise : not able to exercise d/t back pain, diet - room for improvement, + wt gain; rec moderation --Labs: CMP, FLP, CBC, TSH --Lung cancer screening, last CT 09/30/2018, no malignancy, CT did show LAD coronary disease.

## 2018-10-02 NOTE — Progress Notes (Signed)
Pre visit review using our clinic review tool, if applicable. No additional management support is needed unless otherwise documented below in the visit note. 

## 2018-10-02 NOTE — Progress Notes (Signed)
Subjective:    Patient ID: [name], female    DOB: December 15, 1945, 73 y.o.   MRN: [id number]  DOS:  10/02/2018 Type of visit - description:  CPX In addition to CPX, multiple other issues were addressed. Patient had a MRI, showed spinal stenosis, is scheduled for surgery, tentatively August 2020. Wonders about a second opinion Increased pain medication dose?.   Review of Systems Other than the pain she feels well, unable to exercise  Some constipation due to narcotics but she managed well with fiber supplements   Other than above, a 14 point review of systems is negative    Past Medical History:  Diagnosis Date  . Cataract    both eyes rt. eye was removed  . Chest pain    stress test @ [facility] cardiology (-)  . Colonic polyp    bx adenomatous polyps, next scope 2010  . Fibromyalgia    see rheumatology  . Glaucoma    not glaucoma surgically removed growths on cornea  . Hypothyroidism   . Migraine   . 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 Osteoporosis    osteopenia  . Pleuritic chest pain    etiology unclear, has beeb eval by pulmonary and rheumatology (autoimmune dz?)  . Wrist fracture 2018    Past Surgical History:  Procedure Laterality Date  . COSMETIC SURGERY  04/12/2018   L upper eyelid ptosis repair, and trichophytic brow lift  . ESOPHAGOGASTRODUODENOSCOPY (EGD) WITH PROPOFOL N/A 03/27/2013   Procedure: ESOPHAGOGASTRODUODENOSCOPY (EGD) WITH PROPOFOL;  Surgeon: Milus Banister, MD;  Location: [facility] END[facility];  Service: Endoscopy;  Laterality: N/A;  possible dil  . 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  . plastic surgery face  2013  . POLYPECTOMY    . right rotator cuff repair  2010  . TONSILLECTOMY      Social History   Socioeconomic History  . Marital status: Married    Spouse name: Not on file  . Number of children: 0  . Years of education: Not on file  . Highest education level: Not on file  Occupational History  . Occupation: retired, Medical sales representative  work    Fish farm manager: RETIRED  Social Needs  . Financial resource strain: Not on file  . Food insecurity    Worry: Not on file    Inability: Not on file  . Transportation needs    Medical: Not on file    Non-medical: Not on file  Tobacco Use  . Smoking status: Former Smoker    Packs/day: 1.00    Years: 38.50    Pack years: 38.50    Types: Cigarettes    Quit date: 01/13/2004    Years since quitting: 14.7  . Smokeless tobacco: Never Used  . Tobacco comment: quit 2005  Substance and Sexual Activity  . Alcohol use: No    Alcohol/week: 0.0 standard drinks    Comment: rare  . Drug use: No  . Sexual activity: Not Currently  Lifestyle  . Physical activity    Days per week: Not on file    Minutes per session: Not on file  . Stress: Not on file  Relationships  . Social Herbalist on phone: Not on file    Gets together: Not on file    Attends religious service: Not on file    Active member of club or organization: Not on file    Attends meetings of clubs or organizations: Not on file  Relationship status: Not on file  . Intimate partner violence    Fear of current or ex partner: Not on file    Emotionally abused: Not on file    Physically abused: Not on file    Forced sexual activity: Not on file  Other Topics Concern  . Not on file  Social History Narrative   No biological children        Family History  Adopted: Yes  Problem Relation Age of Onset  . Colon cancer Neg Hx   . Cancer Neg Hx   . Depression Neg Hx   . Diabetes Neg Hx   . Stroke Neg Hx   . Hypertension Neg Hx   . Heart disease Neg Hx   . Colon polyps Neg Hx      Allergies as of 10/02/2018      Reactions   Alendronate Sodium    REACTION: urticaria (hives)   Bactrim [sulfamethoxazole-trimethoprim]    Upset stomach   Contrast Media [iodinated Diagnostic Agents] Hives   Milnacipran    REACTION: increased bp, increased hr   Penicillins    REACTION: urticaria (hives)      Medication List        Accurate as of October 02, 2018 11:59 PM. If you have any questions, ask your nurse or doctor.        STOP taking these medications   conjugated estrogens vaginal cream Commonly known as: PREMARIN Stopped by: Kathlene November, MD   HYDROcodone-acetaminophen 5-325 MG tablet Commonly known as: NORCO/VICODIN Replaced by: HYDROcodone-Acetaminophen 7.5-300 MG Tabs Stopped by: Kathlene November, MD   moxifloxacin 0.5 % ophthalmic solution Commonly known as: Vigamox Stopped by: Kathlene November, MD   Restasis 0.05 % ophthalmic emulsion Generic drug: cycloSPORINE Stopped by: Kathlene November, MD     TAKE these medications   ALPRAZolam 0.5 MG tablet Commonly known as: XANAX Take 1 tablet (0.5 mg total) by mouth at bedtime as needed for anxiety.   calcium carbonate 1250 (500 Ca) MG chewable tablet Commonly known as: OS-CAL Chew 1 tablet by mouth daily.   denosumab 60 MG/ML Sosy injection Commonly known as: PROLIA Inject 60 mg into the skin every 6 (six) months. Administer in upper arm, thigh, or abdomen   Estradiol 10 MCG Tabs vaginal tablet pv at hs x 2 weeks, then 1 pv at hs twice weekly. #18 refills of 8 tablets after first month.   HYDROcodone-Acetaminophen 7.5-300 MG Tabs Take 1 tablet by mouth 3 (three) times daily. This is a early refill, is ok Replaces: HYDROcodone-acetaminophen 5-325 MG tablet Started by: Kathlene November, MD   levothyroxine 88 MCG tablet Commonly known as: SYNTHROID Take 1 tablet (88 mcg total) by mouth daily before breakfast.   multivitamin capsule Take 1 capsule by mouth daily.   polycarbophil 625 MG tablet Commonly known as: FIBERCON Take 625 mg by mouth daily.   Shingrix injection Generic drug: Zoster Vaccine Adjuvanted Inject 0.5 mLs into the muscle once for 1 dose. Started by: Kathlene November, MD   Vitamin D3 25 MCG (1000 UT) Caps Take 1,200 Units by mouth.           Objective:   Physical Exam BP 123/79 (BP Location: Left Arm, Patient Position: Sitting, Cuff Size:  Small)   Pulse 64   Temp 98.1 F (36.7 C) (Oral)   Resp 16   Ht 5\' 5"  (1.651 m)   Wt 185 lb 4 oz (84 kg)   LMP 04/10/1998 (Approximate)   SpO2 99%  BMI 30.83 kg/m   General: Well developed, NAD, BMI noted Neck: No  thyromegaly  HEENT:  Normocephalic . Face symmetric, atraumatic Lungs:  CTA B Normal respiratory effort, no intercostal retractions, no accessory muscle use. Heart: RRR,  no murmur.  No pretibial edema bilaterally  Abdomen:  Not distended, soft, non-tender. No rebound or rigidity.   Skin: Exposed areas without rash. Not pale. Not jaundice Neurologic:  alert & oriented X3.  Speech normal, gait appropriate for age and unassisted Strength symmetric and appropriate for age.  Psych: Cognition and judgment appear intact.  Cooperative with normal attention span and concentration.  Behavior appropriate. No anxious or depressed appearing.     Assessment    Assessment Hypothyroidism GERD Migraines insmonia-- xanax prn MSK- Pain mngmt  -- back pain, 2017 had a MRI, saw Dr Durward Fortes, Rx local injection, PT --History of fibromyalgia --H/o Pleuritic chest pain, on-off, resolved  --spinal stenosis (MRI 05/2018)  Osteopenia:  Per gyn as off 2019 --T score 2011   -2.6 (intolerant to fosamax, ok w/ Boniva x years) --T score 01-2013   -2.6 --T score 02-2015  -2.3, rx ca and cit D  Glaucoma Ocular Rosacea 2010 Lung cancer screening CT 09-2015 CP admitted, stress lest low risk 05-2015   PLAN:  Hypothyroidism: On Synthroid, check a TSH Insomnia: On Xanax as needed Spinal stenosis, documented by MRI 05-2018.  Dr. Rolena Infante offered surgery, possibly by August.  Patient wonders about a second opinion, I think is important that she has peace of mind, will refer to [facility] orthopedic service. Pain management: Currently on hydrocodone 5 mg, having breakthrough pain, occasionally takes 4 tablets instead of 3. Change hydrocodone to 7.5 mg 3 times a day.  Osteopenia: Previously managed by gynecology, was referred to endocrinologist.  Left anterior descending artery disease  per CT scan of the chest: EKG today NSR, she has no symptoms.  (-) Myoview 2017 stress test.  No further testing seems to be indicated.  Plan is to control CV RF RTC 6 months     Today, in addition to her physical exam I spent more than 20 minutes with the patient managing her chronic medical problems including thyroid disease, pain management, CAD. Chart was reviewed and a recent MRI was reviewed together with the patient.

## 2018-10-03 DIAGNOSIS — I2510 Atherosclerotic heart disease of native coronary artery without angina pectoris: Secondary | ICD-10-CM | POA: Insufficient documentation

## 2018-10-03 NOTE — Assessment & Plan Note (Signed)
Hypothyroidism: On Synthroid, check a TSH Insomnia: On Xanax as needed Spinal stenosis, documented by MRI 05-2018.  Dr. Rolena Infante offered surgery, possibly by August.  Patient wonders about a second opinion, I think is important that she has peace of mind, will refer to [facility] orthopedic service. Pain management: Currently on hydrocodone 5 mg, having breakthrough pain, occasionally takes 4 tablets instead of 3. Change hydrocodone to 7.5 mg 3 times a day. Osteopenia: Previously managed by gynecology, was referred to endocrinologist. CAD: Left anterior descending artery disease  per CT scan of the chest: EKG today NSR, she has no symptoms.  (-) Myoview 2017 stress test.  No further testing seems to be indicated.  Plan is to control CV RF RTC 6 months

## 2018-10-04 ENCOUNTER — Other Ambulatory Visit: Payer: Self-pay

## 2018-10-04 ENCOUNTER — Ambulatory Visit: Payer: Medicare HMO | Admitting: Internal Medicine

## 2018-10-04 ENCOUNTER — Encounter: Payer: Self-pay | Admitting: Internal Medicine

## 2018-10-04 VITALS — BP 128/80 | HR 74 | Ht 64.5 in | Wt 183.0 lb

## 2018-10-04 DIAGNOSIS — M810 Age-related osteoporosis without current pathological fracture: Secondary | ICD-10-CM

## 2018-10-04 LAB — VITAMIN D 25 HYDROXY (VIT D DEFICIENCY, FRACTURES): VITD: 46.23 ng/mL (ref 30.00–100.00)

## 2018-10-04 NOTE — Patient Instructions (Addendum)
Please stop at the lab.  Please continue Prolia.  Please send me a message in 02/2019 to order the new Bone density scan.  Please come back for a follow-up appointment in 1 year.  How Can I Prevent Falls? Men and women with osteoporosis need to take care not to fall down. Falls can break bones. Some reasons people fall are: Poor vision  Poor balance  Certain diseases that affect how you walk  Some types of medicine, such as sleeping pills.  Some tips to help prevent falls outdoors are: Use a cane or walker  Wear rubber-soled shoes so you don't slip  Walk on grass when sidewalks are slippery  In winter, put salt or kitty litter on icy sidewalks.  Some ways to help prevent falls indoors are: Keep rooms free of clutter, especially on floors  Use plastic or carpet runners on slippery floors  Wear low-heeled shoes that provide good support  Do not walk in socks, stockings, or slippers  Be sure carpets and area rugs have skid-proof backs or are tacked to the floor  Be sure stairs are well lit and have rails on both sides  Put grab bars on bathroom walls near tub, shower, and toilet  Use a rubber bath mat in the shower or tub  Keep a flashlight next to your bed  Use a sturdy step stool with a handrail and wide steps  Add more lights in rooms (and night lights) Buy a cordless phone to keep with you so that you don't have to rush to the phone       when it rings and so that you can call for help if you fall.   (adapted from http://www.niams.NightlifePreviews.se)  Exercise for Strong Bones (from [employer]) There are two types of exercises that are important for building and maintaining bone density:  weight-bearing and muscle-strengthening exercises. Weight-bearing Exercises These exercises include activities that make you move against gravity while staying upright. Weight-bearing exercises can be high-impact or  low-impact. High-impact weight-bearing exercises help build bones and keep them strong. If you have broken a bone due to osteoporosis or are at risk of breaking a bone, you may need to avoid high-impact exercises. If you're not sure, you should check with your healthcare provider. Examples of high-impact weight-bearing exercises are: . Dancing . Doing high-impact aerobics . Hiking . Jogging/running . Jumping Rope . Stair climbing . Tennis Low-impact weight-bearing exercises can also help keep bones strong and are a safe alternative if you cannot do high-impact exercises. Examples of low-impact weight-bearing exercises are: . Using elliptical training machines . Doing low-impact aerobics . Using stair-step machines . Fast walking on a treadmill or outside Muscle-Strengthening Exercises These exercises include activities where you move your body, a weight or some other resistance against gravity. They are also known as resistance exercises and include: . Lifting weights . Using elastic exercise bands . Using weight machines . Lifting your own body weight . Functional movements, such as standing and rising up on your toes Yoga and Pilates can also improve strength, balance and flexibility. However, certain positions may not be safe for people with osteoporosis or those at increased risk of broken bones. For example, exercises that have you bend forward may increase the chance of breaking a bone in the spine. A physical therapist should be able to help you learn which exercises are safe and appropriate for you. Non-Impact Exercises Non-impact exercises can help you to improve balance, posture and how well you  move in everyday activities. These exercises can also help to increase muscle strength and decrease the risk of falls and broken bones. Some of these exercises include: . Balance exercises that strengthen your legs and test your balance, such as Tai Chi, can decrease your risk of  falls. . Posture exercises that improve your posture and reduce rounded or "sloping" shoulders can help you decrease the chance of breaking a bone, especially in the spine. . Functional exercises that improve how well you move can help you with everyday activities and decrease your chance of falling and breaking a bone. For example, if you have trouble getting up from a chair or climbing stairs, you should do these activities as exercises. A physical therapist can teach you balance, posture and functional exercises. Starting a New Exercise Program If you haven't exercised regularly for a while, check with your healthcare provider before beginning a new exercise program-particularly if you have health problems such as heart disease, diabetes or high blood pressure. If you're at high risk of breaking a bone, you should work with a physical therapist to develop a safe exercise program. Once you have your healthcare provider's approval, start slowly. If you've already broken bones in the spine because of osteoporosis, be very careful to avoid activities that require reaching down, bending forward, rapid twisting motions, heavy lifting and those that increase your chance of a fall. As you get started, your muscles may feel sore for a day or two after you exercise. If soreness lasts longer, you may be working too hard and need to ease up. Exercises should be done in a pain-free range of motion. How Much Exercise Do You Need? Weight-bearing exercises 30 minutes on most days of the week. Do a 30-minutesession or multiple sessions spread out throughout the day. The benefits to your bones are the same.   Muscle-strengthening exercises Two to three days per week. If you don't have much time for strengthening/resistance training, do small amounts at a time. You can do just one body part each day. For example do arms one day, legs the next and trunk the next. You can also spread these exercises out during your normal  day.  Balance, posture and functional exercises Every day or as often as needed. You may want to focus on one area more than the others. If you have fallen or lose your balance, spend time doing balance exercises. If you are getting rounded shoulders, work more on posture exercises. If you have trouble climbing stairs or getting up from the couch, do more functional exercises. You can also perform these exercises at one time or spread them during your day. Work with a phyiscal therapist to learn the right exercises for you.
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
## 2018-10-08 ENCOUNTER — Telehealth: Payer: Self-pay

## 2018-10-08 NOTE — Telephone Encounter (Signed)
Wants results from low dose ct screen.  Will forward to SG and Doroteo Glassman.

## 2018-10-09 ENCOUNTER — Encounter: Payer: Self-pay | Admitting: Internal Medicine

## 2018-10-09 NOTE — Telephone Encounter (Signed)
These results were called to the patient by Doroteo Glassman RN on 6/30 at 10:39 am. Please see telephone note. Thanks so much

## 2018-10-10 ENCOUNTER — Other Ambulatory Visit: Payer: Self-pay | Admitting: Internal Medicine

## 2018-10-10 MED ORDER — HYDROCODONE-ACETAMINOPHEN 7.5-325 MG PO TABS: 1.0000 | ORAL_TABLET | Freq: Three times a day (TID) | ORAL | 0 refills | Status: DC | PRN

## 2018-10-14 ENCOUNTER — Other Ambulatory Visit: Payer: Self-pay | Admitting: *Deleted

## 2018-10-14 DIAGNOSIS — Z122 Encounter for screening for malignant neoplasm of respiratory organs: Secondary | ICD-10-CM

## 2018-10-14 DIAGNOSIS — Z87891 Personal history of nicotine dependence: Secondary | ICD-10-CM

## 2018-10-21 ENCOUNTER — Other Ambulatory Visit: Payer: Self-pay | Admitting: Internal Medicine

## 2018-10-28 ENCOUNTER — Telehealth: Payer: Self-pay | Admitting: Internal Medicine

## 2018-10-28 NOTE — Telephone Encounter (Signed)
I am waiting to hear back from insurance company to see if it would be better to use medical benefit or go through the pharmacy benefit

## 2018-10-28 NOTE — Telephone Encounter (Signed)
MEDICATION: Prolia  PHARMACY:  [facility]  IS THIS A 90 DAY SUPPLY :   IS PATIENT OUT OF MEDICATION:   IF NOT; HOW MUCH IS LEFT:   LAST APPOINTMENT DATE: @6 /26/2020  NEXT APPOINTMENT DATE:@Visit  date not found  DO WE HAVE YOUR PERMISSION TO LEAVE A DETAILED MESSAGE:  OTHER COMMENTS:    **Let patient know to contact pharmacy at the end of the day to make sure medication is ready. **  ** Please notify patient to allow 48-72 hours to process**  **Encourage patient to contact the pharmacy for refills or they can request refills through [facility]**

## 2018-10-30 NOTE — Telephone Encounter (Signed)
Patient called and wanted a status on Prolia. She said that she got a call from [employer] today.  Before she went forward with them she wanted to be sure what the scenario was for coverage.  Patient requested a call back about status - call back phone number is [phone]

## 2018-11-05 ENCOUNTER — Telehealth: Payer: Self-pay | Admitting: Internal Medicine

## 2018-11-05 NOTE — Telephone Encounter (Signed)
Patient requests to be called asap at ph# [phone] re: Prolia-patient is due for injection-[facility] has been calling patient to ask if she wants them to send the Prolia. Patient wants to know who our office uses to supply the Prolia. Please call patient at the ph# listed above to advise.

## 2018-11-07 ENCOUNTER — Telehealth: Payer: Self-pay | Admitting: Internal Medicine

## 2018-11-07 MED ORDER — HYDROCODONE-ACETAMINOPHEN 7.5-325 MG PO TABS: 1.0000 | ORAL_TABLET | Freq: Three times a day (TID) | ORAL | 0 refills | Status: DC | PRN

## 2018-11-07 NOTE — Telephone Encounter (Signed)
Hydrocodone refill.   Last OV: 10/02/2018 Last Fill: 10/10/2018 #90 and 0RF Pt sig: 1 tab tid prn UDS: 03/13/2018 Low risk

## 2018-11-07 NOTE — Telephone Encounter (Signed)
sent 

## 2018-11-08 NOTE — Telephone Encounter (Signed)
Have summary of benefits-waiting for out-of-pocket cost

## 2018-11-10 ENCOUNTER — Other Ambulatory Visit: Payer: Self-pay | Admitting: Obstetrics and Gynecology

## 2018-11-11 NOTE — Telephone Encounter (Signed)
Medication refill request: [id number] Last AEX:  01-10-18 Next AEX: 01-15-2019 Last MMG (if hormonal medication request): 02-20-2018 category b density birads 1:neg Refill authorized: please approve if appropriate

## 2018-11-20 DIAGNOSIS — K130 Diseases of lips: Secondary | ICD-10-CM | POA: Diagnosis not present

## 2018-11-20 DIAGNOSIS — R202 Paresthesia of skin: Secondary | ICD-10-CM | POA: Diagnosis not present

## 2018-11-20 DIAGNOSIS — L720 Epidermal cyst: Secondary | ICD-10-CM | POA: Diagnosis not present

## 2018-11-20 DIAGNOSIS — L821 Other seborrheic keratosis: Secondary | ICD-10-CM | POA: Diagnosis not present

## 2018-11-20 DIAGNOSIS — L905 Scar conditions and fibrosis of skin: Secondary | ICD-10-CM | POA: Diagnosis not present

## 2018-11-20 DIAGNOSIS — L719 Rosacea, unspecified: Secondary | ICD-10-CM | POA: Diagnosis not present

## 2018-11-21 DIAGNOSIS — M48062 Spinal stenosis, lumbar region with neurogenic claudication: Secondary | ICD-10-CM | POA: Diagnosis not present

## 2018-11-22 DIAGNOSIS — M48062 Spinal stenosis, lumbar region with neurogenic claudication: Secondary | ICD-10-CM | POA: Insufficient documentation

## 2018-11-24 ENCOUNTER — Telehealth: Payer: Self-pay | Admitting: Internal Medicine

## 2018-11-25 NOTE — Telephone Encounter (Signed)
sent 

## 2018-11-25 NOTE — Telephone Encounter (Signed)
Alprazolam rx.   Last OV: 10/02/2018 Last Fill; 06/03/2018 #30 and 5rf UDS: 03/13/2018 Low risk

## 2018-11-26 NOTE — Progress Notes (Signed)
CARDIOLOGY CONSULT NOTE       Patient ID: [name] MRN: [id number] DOB/AGE: May 22, 1945 73 y.o.  Admit date: (Not on file) Referring Physician: Larose Kells Primary Physician: Colon Branch, MD Primary Cardiologist: Croitoru Reason for Consultation: Pre operative clearance   Active Problems:   * No active hospital problems. *   HPI:  73 y.o. history of hypothyroidism, migraine and fibromyalgia. Seen by Dr Sallyanne Kuster for atypical chest pain 04/21/15 Palpitations benign with rare PVC;s on telemetry Myovue done 05/22/15 normal with EF 72% no ischemia or infarction Previous smoker 35 pack years quit about 16 years ago. Lung cancer screening CT with emphysema no lesions commented on LAD calcium LDL is 96 on labs done 10/02/18 not on statin TSH was normal with adequate replacement  Appears to need spinal stenosis surgery Has seen Dr Rolena Infante  MRI spine showed severe L23 stenosis   Sedentary some pressure in chest and postural dizziness. Walks dogs without chest pain. Concerned about age previous smoking major surgery and calcium seen on lung scan  From [place]. Lived in [place] worked for [employer] All other systems reviewed and negative except as noted above  Past Medical History:  Diagnosis Date  . Cataract    both eyes rt. eye was removed  . Chest pain    stress test @ [facility] cardiology (-)  . Colonic polyp    bx adenomatous polyps, next scope 2010  . Fibromyalgia    see rheumatology  . Glaucoma    not glaucoma surgically removed growths on cornea  . Hypothyroidism   . Migraine   . 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 Osteoporosis    osteopenia  . Pleuritic chest pain    etiology unclear, has beeb eval by pulmonary and rheumatology (autoimmune dz?)  . Wrist fracture 2018    Family History  Adopted: Yes  Problem Relation Age of Onset  . Colon cancer Neg Hx   . Cancer Neg Hx   . Depression Neg Hx   . Diabetes Neg Hx   . Stroke Neg Hx   . Hypertension Neg Hx    . Heart disease Neg Hx   . Colon polyps Neg Hx     Social History   Socioeconomic History  . Marital status: Married    Spouse name: Not on file  . Number of children: 0  . Years of education: Not on file  . Highest education level: Not on file  Occupational History  . Occupation: retired, 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  Social Needs  . Financial resource strain: Not on file  . Food insecurity    Worry: Not on file    Inability: Not on file  . Transportation needs    Medical: Not on file    Non-medical: Not on file  Tobacco Use  . Smoking status: Former Smoker    Packs/day: 1.00    Years: 38.50    Pack years: 38.50    Types: Cigarettes    Quit date: 01/13/2004    Years since quitting: 14.9  . Smokeless tobacco: Never Used  . Tobacco comment: quit 2005  Substance and Sexual Activity  . Alcohol use: No    Alcohol/week: 0.0 standard drinks    Comment: rare  . Drug use: No  . Sexual activity: Not Currently  Lifestyle  . Physical activity    Days per week: Not on file    Minutes per session: Not on file  .  Stress: Not on file  Relationships  . Social Herbalist on phone: Not on file    Gets together: Not on file    Attends religious service: Not on file    Active member of club or organization: Not on file    Attends meetings of clubs or organizations: Not on file    Relationship status: Not on file  . Intimate partner violence    Fear of current or ex partner: Not on file    Emotionally abused: Not on file    Physically abused: Not on file    Forced sexual activity: Not on file  Other Topics Concern  . Not on file  Social History Narrative   No biological children       Past Surgical History:  Procedure Laterality Date  . COSMETIC SURGERY  04/12/2018   L upper eyelid ptosis repair, and trichophytic brow lift  . ESOPHAGOGASTRODUODENOSCOPY (EGD) WITH PROPOFOL N/A 03/27/2013   Procedure: ESOPHAGOGASTRODUODENOSCOPY (EGD) WITH PROPOFOL;   Surgeon: Milus Banister, MD;  Location: [facility];  Service: Endoscopy;  Laterality: N/A;  possible dil  . 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  . plastic surgery face  2013  . POLYPECTOMY    . right rotator cuff repair  2010  . TONSILLECTOMY          Physical Exam: Blood pressure (!) 142/72, pulse 70, height 5' 4.5" (1.638 m), weight 183 lb (83 kg), last menstrual period 04/10/1998, SpO2 97 %.    Affect appropriate Healthy:  appears stated age 77: normal Neck supple with no adenopathy JVP normal no bruits no thyromegaly Lungs clear with no wheezing and good diaphragmatic motion Heart:  S1/S2 no murmur, no rub, gallop or click PMI normal Abdomen: benighn, BS positve, no tenderness, no AAA no bruit.  No HSM or HJR Distal pulses intact with no bruits No edema Neuro non-focal Skin warm and dry No muscular weakness   Labs:   Lab Results  Component Value Date   WBC 6.2 10/02/2018   HGB 13.4 10/02/2018   HCT 40.7 10/02/2018   MCV 94.2 10/02/2018   PLT 246.0 10/02/2018   No results for input(s): NA, K, CL, CO2, BUN, CREATININE, CALCIUM, PROT, BILITOT, ALKPHOS, ALT, AST, GLUCOSE in the last 168 hours.  Invalid input(s): LABALBU Lab Results  Component Value Date   CKTOTAL 53 12/06/2007   TROPONINI <0.03 05/21/2015    Lab Results  Component Value Date   CHOL 171 10/02/2018   CHOL 169 09/17/2017   CHOL 173 09/13/2016   Lab Results  Component Value Date   HDL 44.40 10/02/2018   HDL 44.00 09/17/2017   HDL 47.60 09/13/2016   Lab Results  Component Value Date   LDLCALC 96 10/02/2018   LDLCALC 102 (H) 09/17/2017   LDLCALC 99 09/13/2016   Lab Results  Component Value Date   TRIG 153.0 (H) 10/02/2018   TRIG 113.0 09/17/2017   TRIG 131.0 09/13/2016   Lab Results  Component Value Date   CHOLHDL 4 10/02/2018   CHOLHDL 4 09/17/2017   CHOLHDL 4 09/13/2016   Lab Results  Component Value Date   LDLDIRECT 92.0 03/04/2007      Radiology: No results  found.  EKG: SR prominent R wave in V1   ASSESSMENT AND PLAN:   Preoperative. CAD on CT moderate risk surgery f/u lexiscan myovue can compare to 2017 scan  Thyroid: on replacement TSH normal f/u Dr Larose Kells HLD:  Given  CAD seen on CT in LAD can consider low dose statin with target LDL 70 or less f/u primary  Anxiety:  Has xanax prescribed by Dr Larose Kells    Signed: Jenkins Rouge 12/10/2018, 8:42 AM

## 2018-11-28 ENCOUNTER — Telehealth: Payer: Self-pay | Admitting: Internal Medicine

## 2018-11-28 NOTE — Telephone Encounter (Signed)
Patient requests to be called at ph# [phone] re: getting her Prolia injection and to discuss insurance coverage

## 2018-12-02 NOTE — Telephone Encounter (Signed)
Spoke with pt gave her the update on prolia scheduling  Submitted the PA today just awaiting approval then can schedule appt

## 2018-12-03 NOTE — Telephone Encounter (Signed)
PA form needs to be signed by Provider, will place on desk

## 2018-12-03 NOTE — Telephone Encounter (Signed)
Form has been signed and resubmitted

## 2018-12-05 ENCOUNTER — Telehealth: Payer: Self-pay | Admitting: Internal Medicine

## 2018-12-05 NOTE — Telephone Encounter (Signed)
prolia has been approved. She is responsible for 20% equalling up to about $250-can be billed  Pt is ready for scheduling

## 2018-12-06 ENCOUNTER — Telehealth: Payer: Self-pay | Admitting: Internal Medicine

## 2018-12-06 MED ORDER — HYDROCODONE-ACETAMINOPHEN 7.5-325 MG PO TABS: 1.0000 | ORAL_TABLET | Freq: Three times a day (TID) | ORAL | 0 refills | Status: DC | PRN

## 2018-12-06 NOTE — Telephone Encounter (Signed)
signed

## 2018-12-06 NOTE — Telephone Encounter (Signed)
Hydrocodone refill.   Last OV: 10/02/2018 Last Fill: 11/07/2018 #90 and 0RF Pt sig: 1 tab tid prn UDS: 03/13/2018 Low risk

## 2018-12-06 NOTE — Telephone Encounter (Signed)
sent 

## 2018-12-06 NOTE — Telephone Encounter (Signed)
Form completed and placed in PCP red folder for signature.

## 2018-12-06 NOTE — Telephone Encounter (Signed)
Tried calling Pt to let her know form is ready for pick up. No answer, unable to leave message. Will send mychart message- copy of form sent for scanning.

## 2018-12-06 NOTE — Telephone Encounter (Signed)
Patient dropped of handicap placard form to be completed by Dr. Larose Kells. Placed in provider tray. Please call when ready for pick up

## 2018-12-10 ENCOUNTER — Other Ambulatory Visit: Payer: Self-pay

## 2018-12-10 ENCOUNTER — Encounter: Payer: Self-pay | Admitting: *Deleted

## 2018-12-10 ENCOUNTER — Ambulatory Visit (INDEPENDENT_AMBULATORY_CARE_PROVIDER_SITE_OTHER): Payer: Medicare HMO | Admitting: Cardiovascular Disease

## 2018-12-10 ENCOUNTER — Encounter: Payer: Self-pay | Admitting: Cardiovascular Disease

## 2018-12-10 VITALS — BP 142/72 | HR 70 | Ht 64.5 in | Wt 183.0 lb

## 2018-12-10 DIAGNOSIS — Z01818 Encounter for other preprocedural examination: Secondary | ICD-10-CM

## 2018-12-10 DIAGNOSIS — Z01810 Encounter for preprocedural cardiovascular examination: Secondary | ICD-10-CM

## 2018-12-10 NOTE — Patient Instructions (Signed)
Your physician recommends that you continue on your current medications as directed. Please refer to the Current Medication list given to you today.  Your physician has requested that you have a lexiscan myoview. For further information please visit [email or web address]. Please follow instruction sheet, as given.   Your physician recommends that you schedule a follow-up appointment in: AS NEEDED

## 2018-12-13 ENCOUNTER — Other Ambulatory Visit: Payer: Self-pay

## 2018-12-13 ENCOUNTER — Ambulatory Visit (INDEPENDENT_AMBULATORY_CARE_PROVIDER_SITE_OTHER): Payer: Medicare HMO

## 2018-12-13 DIAGNOSIS — Z23 Encounter for immunization: Secondary | ICD-10-CM | POA: Diagnosis not present

## 2018-12-19 ENCOUNTER — Other Ambulatory Visit: Payer: Self-pay

## 2018-12-19 ENCOUNTER — Telehealth (HOSPITAL_COMMUNITY): Payer: Self-pay | Admitting: *Deleted

## 2018-12-19 ENCOUNTER — Ambulatory Visit: Payer: Medicare HMO

## 2018-12-19 DIAGNOSIS — M810 Age-related osteoporosis without current pathological fracture: Secondary | ICD-10-CM

## 2018-12-19 MED ORDER — DENOSUMAB 60 MG/ML ~~LOC~~ SOSY
60.0000 mg | PREFILLED_SYRINGE | Freq: Once | SUBCUTANEOUS | Status: AC
  Administered 2018-12-19: 60 mg via SUBCUTANEOUS

## 2018-12-19 NOTE — Telephone Encounter (Signed)
error 

## 2018-12-19 NOTE — Telephone Encounter (Signed)

## 2018-12-19 NOTE — Progress Notes (Signed)
Per orders of Dr. Gherghe injection of Prolia given today by Halynn Reitano, Certified Medical Assistant . Patient tolerated injection well.  

## 2018-12-23 ENCOUNTER — Ambulatory Visit (HOSPITAL_COMMUNITY): Payer: Medicare HMO | Attending: Cardiovascular Disease

## 2018-12-23 ENCOUNTER — Other Ambulatory Visit: Payer: Self-pay

## 2018-12-23 DIAGNOSIS — Z01818 Encounter for other preprocedural examination: Secondary | ICD-10-CM

## 2018-12-23 DIAGNOSIS — R079 Chest pain, unspecified: Secondary | ICD-10-CM | POA: Diagnosis not present

## 2018-12-23 DIAGNOSIS — R0602 Shortness of breath: Secondary | ICD-10-CM | POA: Diagnosis not present

## 2018-12-23 DIAGNOSIS — Z01810 Encounter for preprocedural cardiovascular examination: Secondary | ICD-10-CM | POA: Insufficient documentation

## 2018-12-23 MED ORDER — TECHNETIUM TC 99M TETROFOSMIN IV KIT
32.5000 | PACK | Freq: Once | INTRAVENOUS | Status: AC | PRN
  Administered 2018-12-23: 32.5 via INTRAVENOUS
  Filled 2018-12-23: qty 33

## 2018-12-23 MED ORDER — TECHNETIUM TC 99M TETROFOSMIN IV KIT
10.8000 | PACK | Freq: Once | INTRAVENOUS | Status: AC | PRN
  Administered 2018-12-23: 10.8 via INTRAVENOUS
  Filled 2018-12-23: qty 11

## 2018-12-23 MED ORDER — REGADENOSON 0.4 MG/5ML IV SOLN
0.4000 mg | Freq: Once | INTRAVENOUS | Status: AC
  Administered 2018-12-23: 0.4 mg via INTRAVENOUS

## 2018-12-24 LAB — MYOCARDIAL PERFUSION IMAGING
LV dias vol: 49 mL (ref 46–106)
LV sys vol: 13 mL
Peak HR: 108 {beats}/min
Rest HR: 67 {beats}/min
SDS: 1
SRS: 0
SSS: 1
TID: 0.95

## 2018-12-26 ENCOUNTER — Telehealth: Payer: Self-pay | Admitting: Internal Medicine

## 2018-12-26 ENCOUNTER — Other Ambulatory Visit (HOSPITAL_BASED_OUTPATIENT_CLINIC_OR_DEPARTMENT_OTHER): Payer: Self-pay | Admitting: Obstetrics and Gynecology

## 2018-12-26 ENCOUNTER — Other Ambulatory Visit: Payer: Self-pay | Admitting: Internal Medicine

## 2018-12-26 ENCOUNTER — Other Ambulatory Visit (HOSPITAL_BASED_OUTPATIENT_CLINIC_OR_DEPARTMENT_OTHER): Payer: Self-pay | Admitting: Internal Medicine

## 2018-12-26 DIAGNOSIS — Z1231 Encounter for screening mammogram for malignant neoplasm of breast: Secondary | ICD-10-CM

## 2018-12-26 DIAGNOSIS — M810 Age-related osteoporosis without current pathological fracture: Secondary | ICD-10-CM

## 2018-12-26 NOTE — Telephone Encounter (Signed)
Patient is requesting for Dr.Gherghe to put in a Bone Density scan in.  Please Advise, Thanks

## 2018-12-26 NOTE — Telephone Encounter (Signed)
I put the order in, but this can only be checked after 02/16/2019, 2 years from her previous scan.  I ordered this to be done at [facility], on the same DEXA machine as previous

## 2019-01-05 ENCOUNTER — Telehealth: Payer: Self-pay | Admitting: Internal Medicine

## 2019-01-06 MED ORDER — HYDROCODONE-ACETAMINOPHEN 7.5-325 MG PO TABS: 1.0000 | ORAL_TABLET | Freq: Three times a day (TID) | ORAL | 0 refills | Status: DC | PRN

## 2019-01-06 NOTE — Telephone Encounter (Signed)
Sent!

## 2019-01-06 NOTE — Telephone Encounter (Signed)
Hydrocodone refill.   Last OV: 10/02/2018 Last Fill: 12/06/2018 #90 and 0RF Pt sig: 1 tab tid prn UDS: 03/13/2018 Low risk

## 2019-01-13 ENCOUNTER — Other Ambulatory Visit: Payer: Self-pay

## 2019-01-14 NOTE — Progress Notes (Signed)
73 y.o. G92P0000 Married Caucasian female here for annual exam.    Denies vaginal bleeding.  Has constipation and diarrhea alternating.  This is ongoing.  With straining she feels a bulge in her right upper quadrant, and it is painful.  She pushes it back down.   Using vaginal estrogen tablets.  States she has a vaginal odor after using the Vagifem. This resolves a day or so after she uses this.   Urinary frequency.  NF - every 2 hours.  States she does drink a lot fluids.  She bends over to void completely.  She does a valsalva to complete her voiding.   Patient to have back surgery in January, 2021 due to spinal stenosis.  Last Prolia was in Jan. 22, 2020 through this office.  She is now seeing Dr. Cruzita Lederer at [place].  She will do a bone density in November, 2020.  PCP:  Kathlene November, MD   Patient's last menstrual period was 04/10/1998 (approximate).           Sexually active: No.  The current method of family planning is post menopausal status.    Exercising: Yes.    walks daily Smoker:  no  Health Maintenance: Pap:  01/03/2017 neg, 01-08-12 Neg.   History of abnormal Pap:  no MMG: 02-20-18 3D/Neg/density B/BiRads1.  Has this scheduled.  Colonoscopy:  05/04/2017 hx polyp follow up 3-5 years BMD: 02-15-17 Result :Osteopenia.  Endocrinology managing.  TDaP:  2017 Gardasil:   n/a [id number] Hep C:09-08-15 Neg Screening Labs:   PCP. Flu vaccine:  Completed.  Shingrix:  First one completed.   reports that she quit smoking about 15 years ago. Her smoking use included cigarettes. She has a 38.50 pack-year smoking history. She has never used smokeless tobacco. She reports that she does not drink alcohol or use drugs.  Past Medical History:  Diagnosis Date  . Cataract    both eyes rt. eye was removed  . Chest pain    stress test @ [facility] cardiology (-)  . Colonic polyp    bx adenomatous polyps, next scope 2010  . Fibromyalgia    see rheumatology  . Glaucoma    not  glaucoma surgically removed growths on cornea  . Hypothyroidism   . Migraine   . 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 Osteoporosis    osteopenia  . Pleuritic chest pain    etiology unclear, has beeb eval by pulmonary and rheumatology (autoimmune dz?)  . Wrist fracture 2018    Past Surgical History:  Procedure Laterality Date  . COSMETIC SURGERY  04/12/2018   L upper eyelid ptosis repair, and trichophytic brow lift  . ESOPHAGOGASTRODUODENOSCOPY (EGD) WITH PROPOFOL N/A 03/27/2013   Procedure: ESOPHAGOGASTRODUODENOSCOPY (EGD) WITH PROPOFOL;  Surgeon: Milus Banister, MD;  Location: [facility];  Service: Endoscopy;  Laterality: N/A;  possible dil  . 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  . plastic surgery face  2013  . POLYPECTOMY    . right rotator cuff repair  2010  . TONSILLECTOMY      Current Outpatient Medications  Medication Sig Dispense Refill  . ALPRAZolam (XANAX) 0.5 MG tablet TAKE 1 TABLET (0.5 MG TOTAL) BY MOUTH AT BEDTIME AS NEEDED FOR ANXIETY. 30 tablet 5  . calcium carbonate (OS-CAL) 1250 (500 CA) MG chewable tablet Chew 1 tablet by mouth daily.    . Cholecalciferol (VITAMIN D3) 1000 UNITS CAPS Take 1,200 Units by mouth.    . denosumab (PROLIA) 60  MG/ML SOSY injection Inject 60 mg into the skin every 6 (six) months. Administer in upper arm, thigh, or abdomen 1 Syringe 1  . Estradiol (YUVAFEM) 10 MCG TABS vaginal tablet Insert vaginally twice weekly 24 tablet 0  . HYDROcodone-acetaminophen (NORCO) 7.5-325 MG tablet Take 1 tablet by mouth 3 (three) times daily as needed for moderate pain. 90 tablet 0  . levothyroxine (SYNTHROID) 88 MCG tablet Take 1 tablet (88 mcg total) by mouth daily before breakfast. 90 tablet 1  . Multiple Vitamin (MULTIVITAMIN) capsule Take 1 capsule by mouth daily.      . polycarbophil (FIBERCON) 625 MG tablet Take 625 mg by mouth daily.       No current facility-administered medications for this visit.     Family History  Adopted: Yes   Problem Relation Age of Onset  . Colon cancer Neg Hx   . Cancer Neg Hx   . Depression Neg Hx   . Diabetes Neg Hx   . Stroke Neg Hx   . Hypertension Neg Hx   . Heart disease Neg Hx   . Colon polyps Neg Hx     Review of Systems  All other systems reviewed and are negative.   Exam:   BP 130/74 (Cuff Size: Large)   Pulse 66   Temp 98.1 F (36.7 C) (Temporal)   Resp 14   Ht 5' 4.75" (1.645 m)   Wt 183 lb (83 kg)   LMP 04/10/1998 (Approximate)   BMI 30.69 kg/m     General appearance: alert, cooperative and appears stated age Head: normocephalic, without obvious abnormality, atraumatic Neck: no adenopathy, supple, symmetrical, trachea midline and thyroid normal to inspection and palpation Lungs: clear to auscultation bilaterally Breasts: normal appearance, no masses or tenderness, No nipple retraction or dimpling, No nipple discharge or bleeding, No axillary adenopathy Heart: regular rate and rhythm Abdomen: soft, non-tender; no masses, no organomegaly Extremities: extremities normal, atraumatic, no cyanosis or edema Skin: skin color, texture, turgor normal. No rashes or lesions Lymph nodes: cervical, supraclavicular, and axillary nodes normal. Neurologic: grossly normal  Pelvic: External genitalia:  no lesions              No abnormal inguinal nodes palpated.              Urethra:  normal appearing urethra with no masses, tenderness or lesions              Bartholins and Skenes: normal                 Vagina: normal appearing vagina with normal color and discharge, no lesions              Cervix: no lesions              Pap taken: Yes.   Bimanual Exam:  Uterus:  normal size, contour, position, consistency, mobility, non-tender              Adnexa: no mass, fullness, tenderness              Rectal exam: Yes.  .  Confirms.              Anus:  normal sphincter tone, no lesions.  First degree rectocele felt.   Chaperone was present for exam.  Assessment:   Well woman  visit with normal exam. Osteopenia.  Hx fracture.  Increased risk for fractures by FRAX.  Allergy to Fosamax. On Prolia. Endocrinology managing.  Hypothyroidism. Difficulty completing bowel movements.  First degree rectocele. Vaginal atrophy.  RUQ pain.  Plan: Mammogram screening discussed.  She has an appointment.  Self breast awareness reviewed. Pap and HR HPV as above. Guidelines for Calcium, Vitamin D, regular exercise program including cardiovascular and weight bearing exercise. BMD through endocrinology.  Continue Vagifem.  Refills for one year. Avoid straining.  I discussed her rectocele and urinary frequency today.  We reviewed risk factors for these and possible tx options.  ACOG HOs to patient.  She will do observation for now.  She will follow up with her PCP regarding her RUQ pain.  Follow up annually and prn.   After visit summary provided.

## 2019-01-15 ENCOUNTER — Ambulatory Visit (INDEPENDENT_AMBULATORY_CARE_PROVIDER_SITE_OTHER): Payer: Medicare HMO | Admitting: Obstetrics and Gynecology

## 2019-01-15 ENCOUNTER — Encounter: Payer: Self-pay | Admitting: Obstetrics and Gynecology

## 2019-01-15 ENCOUNTER — Other Ambulatory Visit (HOSPITAL_COMMUNITY)
Admission: RE | Admit: 2019-01-15 | Discharge: 2019-01-15 | Disposition: A | Discharge status: Home / Self Care | Payer: Medicare HMO | Source: Ambulatory Visit | Attending: Obstetrics and Gynecology | Admitting: Obstetrics and Gynecology

## 2019-01-15 ENCOUNTER — Other Ambulatory Visit: Payer: Self-pay

## 2019-01-15 VITALS — BP 130/74 | HR 66 | Temp 98.1°F | Resp 14 | Ht 64.75 in | Wt 183.0 lb

## 2019-01-15 DIAGNOSIS — Z01419 Encounter for gynecological examination (general) (routine) without abnormal findings: Secondary | ICD-10-CM | POA: Diagnosis not present

## 2019-01-15 DIAGNOSIS — N816 Rectocele: Secondary | ICD-10-CM | POA: Diagnosis not present

## 2019-01-15 DIAGNOSIS — R350 Frequency of micturition: Secondary | ICD-10-CM

## 2019-01-15 MED ORDER — ESTRADIOL 10 MCG VA TABS: ORAL_TABLET | VAGINAL | 3 refills | Status: DC

## 2019-01-15 NOTE — Patient Instructions (Signed)

## 2019-01-20 LAB — CYTOLOGY - PAP: Diagnosis: NEGATIVE

## 2019-02-02 ENCOUNTER — Telehealth: Payer: Self-pay | Admitting: Internal Medicine

## 2019-02-03 MED ORDER — HYDROCODONE-ACETAMINOPHEN 7.5-325 MG PO TABS: 1.0000 | ORAL_TABLET | Freq: Three times a day (TID) | ORAL | 0 refills | Status: DC | PRN

## 2019-02-03 NOTE — Telephone Encounter (Signed)
Prescription sent

## 2019-02-03 NOTE — Telephone Encounter (Signed)
Hydrocodone refill.   Last OV: 10/02/2018  Last Fill: 01/06/2019 #90 and 0RF Pt sig: 1 tab tid prn UDS: 03/23/2018 Low risk

## 2019-02-24 ENCOUNTER — Other Ambulatory Visit: Payer: Self-pay

## 2019-02-24 ENCOUNTER — Ambulatory Visit (HOSPITAL_BASED_OUTPATIENT_CLINIC_OR_DEPARTMENT_OTHER)
Admission: RE | Admit: 2019-02-24 | Discharge: 2019-02-24 | Disposition: A | Discharge status: Home / Self Care | Payer: Medicare HMO | Source: Ambulatory Visit | Attending: Internal Medicine | Admitting: Internal Medicine

## 2019-02-24 ENCOUNTER — Ambulatory Visit (HOSPITAL_BASED_OUTPATIENT_CLINIC_OR_DEPARTMENT_OTHER)
Admission: RE | Admit: 2019-02-24 | Discharge: 2019-02-24 | Disposition: A | Discharge status: Home / Self Care | Payer: Medicare HMO | Source: Ambulatory Visit | Attending: Obstetrics and Gynecology | Admitting: Obstetrics and Gynecology

## 2019-02-24 DIAGNOSIS — M810 Age-related osteoporosis without current pathological fracture: Secondary | ICD-10-CM | POA: Insufficient documentation

## 2019-02-24 DIAGNOSIS — Z1231 Encounter for screening mammogram for malignant neoplasm of breast: Secondary | ICD-10-CM

## 2019-02-24 DIAGNOSIS — M85852 Other specified disorders of bone density and structure, left thigh: Secondary | ICD-10-CM | POA: Diagnosis not present

## 2019-02-24 DIAGNOSIS — Z780 Asymptomatic menopausal state: Secondary | ICD-10-CM | POA: Diagnosis not present

## 2019-03-02 ENCOUNTER — Telehealth: Payer: Self-pay | Admitting: Internal Medicine

## 2019-03-03 MED ORDER — HYDROCODONE-ACETAMINOPHEN 7.5-325 MG PO TABS: 1.0000 | ORAL_TABLET | Freq: Three times a day (TID) | ORAL | 0 refills | Status: DC | PRN

## 2019-03-03 NOTE — Telephone Encounter (Signed)
Requesting:   Hydrocodone  Contract:   03/13/2018 UDS:   Low risk Last Visit:   10/02/2018 Next Visit:   04/01/2019 Last Refill:   #90  No Refills on 02/03/2019  Please Advise

## 2019-03-03 NOTE — Telephone Encounter (Signed)
Sent!

## 2019-03-04 DIAGNOSIS — M545 Low back pain: Secondary | ICD-10-CM | POA: Diagnosis not present

## 2019-03-04 DIAGNOSIS — G8929 Other chronic pain: Secondary | ICD-10-CM | POA: Diagnosis not present

## 2019-03-04 DIAGNOSIS — M48061 Spinal stenosis, lumbar region without neurogenic claudication: Secondary | ICD-10-CM | POA: Diagnosis not present

## 2019-03-13 ENCOUNTER — Ambulatory Visit: Payer: Medicare HMO | Admitting: Internal Medicine

## 2019-03-13 ENCOUNTER — Other Ambulatory Visit: Payer: Self-pay

## 2019-03-15 DIAGNOSIS — M545 Low back pain: Secondary | ICD-10-CM | POA: Diagnosis not present

## 2019-03-15 DIAGNOSIS — G8929 Other chronic pain: Secondary | ICD-10-CM | POA: Diagnosis not present

## 2019-03-21 DIAGNOSIS — M5136 Other intervertebral disc degeneration, lumbar region: Secondary | ICD-10-CM | POA: Diagnosis not present

## 2019-03-24 DIAGNOSIS — K1120 Sialoadenitis, unspecified: Secondary | ICD-10-CM | POA: Diagnosis not present

## 2019-03-28 ENCOUNTER — Telehealth: Payer: Self-pay | Admitting: Internal Medicine

## 2019-03-28 NOTE — Telephone Encounter (Signed)
Requesting:Norco Contract:03/19/2018 UDS:03/13/2018 Last Visit:12/13/2018 Next Visit:04/01/2019 Last Refill:03/03/2019  Please Advise

## 2019-03-31 ENCOUNTER — Other Ambulatory Visit: Payer: Self-pay

## 2019-03-31 MED ORDER — HYDROCODONE-ACETAMINOPHEN 7.5-325 MG PO TABS: 1.0000 | ORAL_TABLET | Freq: Three times a day (TID) | ORAL | 0 refills | Status: DC | PRN

## 2019-03-31 NOTE — Telephone Encounter (Signed)
Sent!

## 2019-04-01 ENCOUNTER — Encounter: Payer: Self-pay | Admitting: Internal Medicine

## 2019-04-01 ENCOUNTER — Ambulatory Visit (INDEPENDENT_AMBULATORY_CARE_PROVIDER_SITE_OTHER): Payer: Medicare HMO | Admitting: Internal Medicine

## 2019-04-01 VITALS — BP 135/71 | HR 68 | Temp 97.6°F | Resp 16 | Ht 65.0 in | Wt 181.5 lb

## 2019-04-01 DIAGNOSIS — M48062 Spinal stenosis, lumbar region with neurogenic claudication: Secondary | ICD-10-CM

## 2019-04-01 DIAGNOSIS — Z79899 Other long term (current) drug therapy: Secondary | ICD-10-CM | POA: Diagnosis not present

## 2019-04-01 DIAGNOSIS — E039 Hypothyroidism, unspecified: Secondary | ICD-10-CM

## 2019-04-01 DIAGNOSIS — M810 Age-related osteoporosis without current pathological fracture: Secondary | ICD-10-CM

## 2019-04-01 NOTE — Progress Notes (Signed)
Pre visit review using our clinic review tool, if applicable. No additional management support is needed unless otherwise documented below in the visit note. 

## 2019-04-01 NOTE — Assessment & Plan Note (Signed)
Hypothyroidism: Last TSH satisfactory.  No change Insomnia: On Xanax as needed.  Check a UDS Pain management: On hydrocodone regularly, check UDS Lumbar spinal stenosis: Got a second opinion at [facility], they agree with Dr. Rolena Infante according to the patient, she is contemplating surgery but not at this point. Osteoporosis: Managed by endocrinology, last DEXA 02-2019, T score -2.2, improved COVID-19: We talk about precautions and future vaccinations, questions were answered RTC 6 months

## 2019-04-01 NOTE — Patient Instructions (Addendum)
Please schedule Medicare Wellness with Glenard Haring.   GO TO THE LAB : provide a urine sample    GO TO THE FRONT DESK Schedule your next appointment   For a physical exam in 6 months

## 2019-04-01 NOTE — Progress Notes (Signed)
Subjective:    Patient ID: [name], female    DOB: 20-May-1945, 73 y.o.   MRN: [id number]  DOS:  04/01/2019 Type of visit - description: Routine office visit Spinal stenosis: went to [place], got a second opinion (cannot find records in epic) according to the patient they essentially agree with Dr. Rolena Infante.  Pain management: On hydrocodone 3 times daily, symptoms controlled  Osteopenia: Per Endo, notes reviewed.  Review of Systems Denies nausea, vomiting, diarrhea.  Occasionally has constipation Emotionally  doing well.   Past Medical History:  Diagnosis Date  . Cataract    both eyes rt. eye was removed  . Chest pain    stress test @ [facility] cardiology (-)  . Colonic polyp    bx adenomatous polyps, next scope 2010  . Fibromyalgia    see rheumatology  . Glaucoma    not glaucoma surgically removed growths on cornea  . Hypothyroidism   . Migraine   . 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 Osteoporosis    osteopenia  . Pleuritic chest pain    etiology unclear, has beeb eval by pulmonary and rheumatology (autoimmune dz?)  . Wrist fracture 2018    Past Surgical History:  Procedure Laterality Date  . COSMETIC SURGERY  04/12/2018   L upper eyelid ptosis repair, and trichophytic brow lift  . ESOPHAGOGASTRODUODENOSCOPY (EGD) WITH PROPOFOL N/A 03/27/2013   Procedure: ESOPHAGOGASTRODUODENOSCOPY (EGD) WITH PROPOFOL;  Surgeon: Milus Banister, MD;  Location: [facility];  Service: Endoscopy;  Laterality: N/A;  possible dil  . 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  . plastic surgery face  2013  . POLYPECTOMY    . right rotator cuff repair  2010  . TONSILLECTOMY      Social History   Socioeconomic History  . Marital status: Married    Spouse name: Not on file  . Number of children: 0  . Years of education: Not on file  . Highest education level: Not on file  Occupational History  . 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 Smoking  status: Former Smoker    Packs/day: 1.00    Years: 38.50    Pack years: 38.50    Types: Cigarettes    Quit date: 01/13/2004    Years since quitting: 15.2  . Smokeless tobacco: Never Used  . Tobacco comment: quit 2005  Substance and Sexual Activity  . Alcohol use: No    Alcohol/week: 0.0 standard drinks    Comment: rare  . Drug use: No  . Sexual activity: Not Currently  Other Topics Concern  . Not on file  Social History Narrative   No biological children      Social Determinants of Health   Financial Resource Strain:   . Difficulty of Paying Living Expenses: Not on file  Food Insecurity:   . Worried About Charity fundraiser in the Last Year: Not on file  . Ran Out of Food in the Last Year: Not on file  Transportation Needs:   . Lack of Transportation (Medical): Not on file  . Lack of Transportation (Non-Medical): Not on file  Physical Activity:   . Days of Exercise per Week: Not on file  . Minutes of Exercise per Session: Not on file  Stress:   . Feeling of Stress : Not on file  Social Connections:   . Frequency of Communication with Friends and Family: Not on file  .  Frequency of Social Gatherings with Friends and Family: Not on file  . Attends Religious Services: Not on file  . Active Member of Clubs or Organizations: Not on file  . Attends Archivist Meetings: Not on file  . Marital Status: Not on file  Intimate Partner Violence:   . Fear of Current or Ex-Partner: Not on file  . Emotionally Abused: Not on file  . Physically Abused: Not on file  . Sexually Abused: Not on file      Allergies as of 04/01/2019      Reactions   Codeine Hives   Alendronate Sodium    REACTION: urticaria (hives)   Bactrim [sulfamethoxazole-trimethoprim]    Upset stomach   Contrast Media [iodinated Diagnostic Agents] Hives   Iodine Hives   Milnacipran    REACTION: increased bp, increased hr   Penicillins    REACTION: urticaria (hives)      Medication List        Accurate as of April 01, 2019 10:23 AM. If you have any questions, ask your nurse or doctor.        ALPRAZolam 0.5 MG tablet Commonly known as: XANAX TAKE 1 TABLET (0.5 MG TOTAL) BY MOUTH AT BEDTIME AS NEEDED FOR ANXIETY.   calcium carbonate 1250 (500 Ca) MG chewable tablet Commonly known as: OS-CAL Chew 1 tablet by mouth daily.   denosumab 60 MG/ML Sosy injection Commonly known as: PROLIA Inject 60 mg into the skin every 6 (six) months. Administer in upper arm, thigh, or abdomen   Estradiol 10 MCG Tabs vaginal tablet Commonly known as: Yuvafem Insert vaginally twice weekly   HYDROcodone-acetaminophen 7.5-325 MG tablet Commonly known as: NORCO Take 1 tablet by mouth 3 (three) times daily as needed for moderate pain.   levothyroxine 88 MCG tablet Commonly known as: SYNTHROID Take 1 tablet (88 mcg total) by mouth daily before breakfast.   multivitamin capsule Take 1 capsule by mouth daily.   polycarbophil 625 MG tablet Commonly known as: FIBERCON Take 625 mg by mouth daily.   Vitamin D3 25 MCG (1000 UT) Caps Take 1,200 Units by mouth.           Objective:   Physical Exam BP 135/71 (BP Location: Left Arm, Patient Position: Sitting, Cuff Size: Normal)   Pulse 68   Temp 97.6 F (36.4 C) (Temporal)   Resp 16   Ht 5\' 5"  (1.651 m)   Wt 181 lb 8 oz (82.3 kg)   LMP 04/10/1998 (Approximate)   SpO2 99%   BMI 30.20 kg/m  General:   Well developed, NAD, BMI noted. HEENT:  Normocephalic . Face symmetric, atraumatic Lungs:  CTA B Normal respiratory effort, no intercostal retractions, no accessory muscle use. Heart: RRR,  no murmur.  No pretibial edema bilaterally  Skin: Not pale. Not jaundice Neurologic:  alert & oriented X3.  Speech normal, gait appropriate for age and unassisted Psych--  Cognition and judgment appear intact.  Cooperative with normal attention span and concentration.  Behavior appropriate. No anxious or depressed appearing.        Assessment     Assessment Hypothyroidism GERD Migraines insmonia-- xanax prn MSK- Pain mngmt  -- back pain, 2017 had a MRI, saw Dr Durward Fortes, Rx local injection, PT --History of fibromyalgia --H/o Pleuritic chest pain, on-off, resolved  --spinal stenosis (MRI 05/2018)  Osteopenia:  Per gyn as off 2019 --T score 2011   -2.6 (intolerant to fosamax, ok w/ Boniva x years) --T score 01-2013   -2.6 --T  score 02-2015  -2.3, rx ca and cit D  Glaucoma Ocular Rosacea 2010 Lung cancer screening CT 09-2015 CV: LAD  artery disease  per CT scan of the chest, CP:  admitted, stress lest low risk 05-2015.  Control CV RF   PLAN: Hypothyroidism: Last TSH satisfactory.  No change Insomnia: On Xanax as needed.  Check a UDS Pain management: On hydrocodone regularly, check UDS Lumbar spinal stenosis: Got a second opinion at [facility], they agree with Dr. Rolena Infante according to the patient, she is contemplating surgery but not at this point. Osteoporosis: Managed by endocrinology, last DEXA 02-2019, T score -2.2, improved COVID-19: We talk about precautions and future vaccinations, questions were answered RTC 6 months   This visit occurred during the SARS-CoV-2 public health emergency.  Safety protocols were in place, including screening questions prior to the visit, additional usage of staff PPE, and extensive cleaning of exam room while observing appropriate contact time as indicated for disinfecting solutions.

## 2019-04-06 LAB — PAIN MGMT, PROFILE 8 W/CONF, U
6 Acetylmorphine: NEGATIVE ng/mL
Alcohol Metabolites: POSITIVE ng/mL — AB (ref <500)
Alphahydroxyalprazolam: 264 ng/mL
Alphahydroxymidazolam: NEGATIVE ng/mL
Alphahydroxytriazolam: NEGATIVE ng/mL
Aminoclonazepam: NEGATIVE ng/mL
Amphetamines: NEGATIVE ng/mL
Benzodiazepines: POSITIVE ng/mL
Buprenorphine, Urine: NEGATIVE ng/mL
Cocaine Metabolite: NEGATIVE ng/mL
Codeine: NEGATIVE ng/mL
Creatinine: 100.8 mg/dL
Ethyl Glucuronide (ETG): 940 ng/mL
Ethyl Sulfate (ETS): NEGATIVE ng/mL
Hydrocodone: 184 ng/mL
Hydromorphone: 328 ng/mL
Hydroxyethylflurazepam: NEGATIVE ng/mL
Lorazepam: NEGATIVE ng/mL
MDMA: NEGATIVE ng/mL
Marijuana Metabolite: NEGATIVE ng/mL
Morphine: NEGATIVE ng/mL
Nordiazepam: NEGATIVE ng/mL
Norhydrocodone: 1721 ng/mL
Opiates: POSITIVE ng/mL
Oxazepam: NEGATIVE ng/mL
Oxidant: NEGATIVE ug/mL
Oxycodone: NEGATIVE ng/mL
Temazepam: NEGATIVE ng/mL
pH: 7.8 (ref 4.5–9.0)

## 2019-04-14 ENCOUNTER — Other Ambulatory Visit: Payer: Self-pay | Admitting: Internal Medicine

## 2019-04-28 ENCOUNTER — Telehealth: Payer: Self-pay | Admitting: Internal Medicine

## 2019-04-28 MED ORDER — HYDROCODONE-ACETAMINOPHEN 7.5-325 MG PO TABS: 1.0000 | ORAL_TABLET | Freq: Three times a day (TID) | ORAL | 0 refills | Status: DC | PRN

## 2019-04-28 NOTE — Telephone Encounter (Signed)
Hydrocodone refill.   Last OV: 04/01/2019 Last Fill: 03/31/2019 #90 and 0RF Pt sig: 1 tab tid prn UDS: 04/01/2019 Low risk

## 2019-04-28 NOTE — Telephone Encounter (Signed)
Sent!

## 2019-05-19 ENCOUNTER — Telehealth: Payer: Self-pay | Admitting: Internal Medicine

## 2019-05-20 NOTE — Telephone Encounter (Signed)
Alprazolam refill.   Last OV: 04/01/2019 Last Fill: 11/25/2018 #30 and 5RF Pt sig: 1 tab qhs prn UDS: 04/01/2019 Low risk

## 2019-05-20 NOTE — Telephone Encounter (Signed)
Sent, PDMP okay 

## 2019-05-24 ENCOUNTER — Ambulatory Visit: Payer: Medicare HMO | Attending: Internal Medicine

## 2019-05-24 DIAGNOSIS — Z23 Encounter for immunization: Secondary | ICD-10-CM | POA: Insufficient documentation

## 2019-05-24 NOTE — Progress Notes (Signed)
   Covid-19 Vaccination Clinic  Name:  [name]    MRN: [id number] DOB: 03-06-1946  05/24/2019  [name] was observed post Covid-19 immunization for 15 minutes without incidence. She was provided with Vaccine Information Sheet and instruction to access the V-Safe system.   [name] was instructed to call 911 with any severe reactions post vaccine: Marland Kitchen Difficulty breathing  . Swelling of your face and throat  . A fast heartbeat  . A bad rash all over your body  . Dizziness and weakness    Immunizations Administered    Name Date Dose VIS Date Route   Pfizer COVID-19 Vaccine 05/24/2019  8:20 AM 0.3 mL 03/21/2019 Intramuscular   Manufacturer: [place]   Lot: [id number]   [place]: [id number]

## 2019-05-26 ENCOUNTER — Telehealth: Payer: Self-pay | Admitting: Internal Medicine

## 2019-05-26 MED ORDER — HYDROCODONE-ACETAMINOPHEN 7.5-325 MG PO TABS: 1.0000 | ORAL_TABLET | Freq: Three times a day (TID) | ORAL | 0 refills | Status: DC | PRN

## 2019-05-26 NOTE — Telephone Encounter (Signed)
PDMP okay, and MME daily 22.5 mg, RF sent

## 2019-05-26 NOTE — Telephone Encounter (Signed)
Hydrocodone refill.   Last OV: 04/01/2019 Last Fill: 04/28/2019 #90 and 0RF Pt sig: 1 tab tid prn UDS: 04/01/2019 Low risk

## 2019-06-10 DIAGNOSIS — H2513 Age-related nuclear cataract, bilateral: Secondary | ICD-10-CM | POA: Diagnosis not present

## 2019-06-10 DIAGNOSIS — H04123 Dry eye syndrome of bilateral lacrimal glands: Secondary | ICD-10-CM | POA: Diagnosis not present

## 2019-06-10 DIAGNOSIS — Z0100 Encounter for examination of eyes and vision without abnormal findings: Secondary | ICD-10-CM | POA: Diagnosis not present

## 2019-06-15 ENCOUNTER — Ambulatory Visit: Payer: Medicare HMO | Attending: Internal Medicine

## 2019-06-15 DIAGNOSIS — Z23 Encounter for immunization: Secondary | ICD-10-CM

## 2019-06-15 NOTE — Progress Notes (Signed)
   Covid-19 Vaccination Clinic  Name:  [name]    MRN: [id number] DOB: 04/24/1945  06/15/2019  [name] was observed post Covid-19 immunization for 15 minutes without incident. She was provided with Vaccine Information Sheet and instruction to access the V-Safe system.   [name] was instructed to call 911 with any severe reactions post vaccine: Marland Kitchen Difficulty breathing  . Swelling of face and throat  . A fast heartbeat  . A bad rash all over body  . Dizziness and weakness   Immunizations Administered    Name Date Dose VIS Date Route   Pfizer COVID-19 Vaccine 06/15/2019  9:17 AM 0.3 mL 03/21/2019 Intramuscular   Manufacturer: [place]   Lot: [id number]   [place]: [id number]

## 2019-06-18 ENCOUNTER — Other Ambulatory Visit: Payer: Self-pay | Admitting: Internal Medicine

## 2019-06-19 DIAGNOSIS — Z008 Encounter for other general examination: Secondary | ICD-10-CM | POA: Diagnosis not present

## 2019-06-19 DIAGNOSIS — H04129 Dry eye syndrome of unspecified lacrimal gland: Secondary | ICD-10-CM | POA: Diagnosis not present

## 2019-06-19 DIAGNOSIS — N951 Menopausal and female climacteric states: Secondary | ICD-10-CM | POA: Diagnosis not present

## 2019-06-19 DIAGNOSIS — M4800 Spinal stenosis, site unspecified: Secondary | ICD-10-CM | POA: Diagnosis not present

## 2019-06-19 DIAGNOSIS — R69 Illness, unspecified: Secondary | ICD-10-CM | POA: Diagnosis not present

## 2019-06-19 DIAGNOSIS — Z79891 Long term (current) use of opiate analgesic: Secondary | ICD-10-CM | POA: Diagnosis not present

## 2019-06-19 DIAGNOSIS — E039 Hypothyroidism, unspecified: Secondary | ICD-10-CM | POA: Diagnosis not present

## 2019-06-19 DIAGNOSIS — Z87891 Personal history of nicotine dependence: Secondary | ICD-10-CM | POA: Diagnosis not present

## 2019-06-19 DIAGNOSIS — M810 Age-related osteoporosis without current pathological fracture: Secondary | ICD-10-CM | POA: Diagnosis not present

## 2019-06-19 DIAGNOSIS — G8929 Other chronic pain: Secondary | ICD-10-CM | POA: Diagnosis not present

## 2019-06-19 MED ORDER — HYDROCODONE-ACETAMINOPHEN 7.5-325 MG PO TABS: 1.0000 | ORAL_TABLET | Freq: Three times a day (TID) | ORAL | 0 refills | Status: DC | PRN

## 2019-06-19 NOTE — Telephone Encounter (Signed)
Requesting:Norco Contract:yes UDS:low risk  Last OV:04/01/19 Next OV:09/24/19 Last Refill:05/26/19  #90-0rf Database:   Please advise

## 2019-06-19 NOTE — Telephone Encounter (Signed)
PDMP okay, prescription sent 

## 2019-06-27 ENCOUNTER — Telehealth: Payer: Self-pay | Admitting: Internal Medicine

## 2019-06-27 NOTE — Telephone Encounter (Signed)
Pt is approved for Prolia and is due now She does owe $250 Auth # [id number] valid thru 12/02/18-12/02/19 LMTCB to set up appt

## 2019-07-03 ENCOUNTER — Ambulatory Visit: Payer: Medicare HMO

## 2019-07-03 ENCOUNTER — Other Ambulatory Visit: Payer: Self-pay

## 2019-07-03 DIAGNOSIS — M810 Age-related osteoporosis without current pathological fracture: Secondary | ICD-10-CM | POA: Diagnosis not present

## 2019-07-03 MED ORDER — DENOSUMAB 60 MG/ML ~~LOC~~ SOSY
60.0000 mg | PREFILLED_SYRINGE | Freq: Once | SUBCUTANEOUS | Status: AC
  Administered 2019-07-03: 60 mg via SUBCUTANEOUS

## 2019-07-03 NOTE — Progress Notes (Signed)
Pre order of Dr Cruzita Lederer injection of prolia given today, patient tolerated well.

## 2019-07-17 NOTE — Progress Notes (Signed)
Nurse connected with patient 07/18/19 at  8:45 AM EDT by a telephone enabled telemedicine application and verified that I am speaking with the correct person using two identifiers. Patient stated full name and DOB. Patient gave permission to continue with virtual visit. Patient's location was at home and Nurse's location was at [place] office.   Subjective:   [name] is a 74 y.o. female who presents for Medicare Annual (Subsequent) preventive examination.  Review of Systems:  Home Safety/Smoke Alarms: Feels safe in home. Smoke alarms in place.  Lives w/ husband and 2 dogs. 2 story home. Does well w/ stairs.   Female:       Mammo-02/25/19       Dexa scan-02/24/19        CCS-05/04/17. Recall 3 yrs.     Objective:     Vitals: BP 130/80 Comment: pt reports  LMP 04/10/1998 (Approximate)    Advanced Directives 07/18/2019 01/24/2018 02/27/2017 12/28/2016 01/20/2016 12/01/2015 05/21/2015  Does Patient Have a Medical Advance Directive? Yes Yes Yes No Yes Yes Yes  Type of Paramedic of [place];Living will - [place];Living will - [place];Living will [place];Living will [place];Living will  Does patient want to make changes to medical advance directive? No - Patient declined No - Patient declined No - Patient declined - - - -  Copy of [place] in Chart? No - copy requested - Yes - No - copy requested No - copy requested -  Would patient like information on creating a medical advance directive? - - - No - Patient declined - - -  Pre-existing out of facility DNR order (yellow form or pink MOST form) - - - - - - -    Tobacco Social History   Tobacco Use  Smoking Status Former Smoker  . Packs/day: 1.00  . Years: 38.50  . Pack years: 38.50  . Types: Cigarettes  . Quit date: 01/13/2004  . Years since quitting: 15.5  Smokeless Tobacco Never Used  Tobacco Comment     quit 2005     Counseling given: Not Answered Comment: quit 2005   Clinical Intake:   Pain : No/denies pain     Past Medical History:  Diagnosis Date  . Cataract    both eyes rt. eye was removed  . Chest pain    stress test @ [facility] cardiology (-)  . Colonic polyp    bx adenomatous polyps, next scope 2010  . Fibromyalgia    see rheumatology  . Glaucoma    not glaucoma surgically removed growths on cornea  . Hypothyroidism   . Migraine   . 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 Osteoporosis    osteopenia  . Pleuritic chest pain    etiology unclear, has beeb eval by pulmonary and rheumatology (autoimmune dz?)  . Wrist fracture 2018   Past Surgical History:  Procedure Laterality Date  . COSMETIC SURGERY  04/12/2018   L upper eyelid ptosis repair, and trichophytic brow lift  . ESOPHAGOGASTRODUODENOSCOPY (EGD) WITH PROPOFOL N/A 03/27/2013   Procedure: ESOPHAGOGASTRODUODENOSCOPY (EGD) WITH PROPOFOL;  Surgeon: Milus Banister, MD;  Location: [facility];  Service: Endoscopy;  Laterality: N/A;  possible dil  . 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  . plastic surgery face  2013  . POLYPECTOMY    . right rotator cuff repair  2010  . TONSILLECTOMY     Family  History  Adopted: Yes  Problem Relation Age of Onset  . Colon cancer Neg Hx   . Cancer Neg Hx   . Depression Neg Hx   . Diabetes Neg Hx   . Stroke Neg Hx   . Hypertension Neg Hx   . Heart disease Neg Hx   . Colon polyps Neg Hx    Social History   Socioeconomic History  . Marital status: Married    Spouse name: Not on file  . Number of children: 0  . Years of education: Not on file  . Highest education level: Not on file  Occupational History  . 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 Smoking status: Former Smoker    Packs/day: 1.00    Years: 38.50    Pack years: 38.50    Types: Cigarettes    Quit date: 01/13/2004    Years since quitting: 15.5  . Smokeless tobacco:  Never Used  . Tobacco comment: quit 2005  Substance and Sexual Activity  . Alcohol use: No    Alcohol/week: 0.0 standard drinks    Comment: rare  . Drug use: No  . Sexual activity: Not Currently  Other Topics Concern  . Not on file  Social History Narrative   No biological children      Social Determinants of Health   Financial Resource Strain: Low Risk   . Difficulty of Paying Living Expenses: Not hard at all  Food Insecurity: No Food Insecurity  . Worried About Charity fundraiser in the Last Year: Never true  . Ran Out of Food in the Last Year: Never true  Transportation Needs: No Transportation Needs  . Lack of Transportation (Medical): No  . Lack of Transportation (Non-Medical): No  Physical Activity:   . Days of Exercise per Week:   . Minutes of Exercise per Session:   Stress:   . Feeling of Stress :   Social Connections:   . Frequency of Communication with Friends and Family:   . Frequency of Social Gatherings with Friends and Family:   . Attends Religious Services:   . Active Member of Clubs or Organizations:   . Attends Archivist Meetings:   Marland Kitchen Marital Status:     Outpatient Encounter Medications as of 07/18/2019  Medication Sig  . ALPRAZolam (XANAX) 0.5 MG tablet Take 1 tablet (0.5 mg total) by mouth at bedtime as needed for anxiety.  . calcium carbonate (OS-CAL) 1250 (500 CA) MG chewable tablet Chew 1 tablet by mouth daily.  . Cholecalciferol (VITAMIN D3) 1000 UNITS CAPS Take 1,200 Units by mouth.  . denosumab (PROLIA) 60 MG/ML SOSY injection Inject 60 mg into the skin every 6 (six) months. Administer in upper arm, thigh, or abdomen  . Estradiol (YUVAFEM) 10 MCG TABS vaginal tablet Insert vaginally twice weekly  . HYDROcodone-acetaminophen (NORCO) 7.5-325 MG tablet Take 1 tablet by mouth 3 (three) times daily as needed for moderate pain.  Marland Kitchen levothyroxine (SYNTHROID) 88 MCG tablet TAKE 1 TABLET (88 MCG TOTAL) BY MOUTH DAILY BEFORE BREAKFAST.  .  Multiple Vitamin (MULTIVITAMIN) capsule Take 1 capsule by mouth daily.    . polycarbophil (FIBERCON) 625 MG tablet Take 625 mg by mouth daily.     No facility-administered encounter medications on file as of 07/18/2019.    Activities of Daily Living In your present state of health, do you have any difficulty performing the following activities: 07/18/2019 10/02/2018  Hearing? N N  Vision? N  N  Difficulty concentrating or making decisions? N N  Walking or climbing stairs? N N  Dressing or bathing? N N  Doing errands, shopping? N N  Preparing Food and eating ? N -  Using the Toilet? N -  In the past six months, have you accidently leaked urine? N -  Do you have problems with loss of bowel control? N -  Managing your Medications? N -  Managing your Finances? N -  Housekeeping or managing your Housekeeping? N -  Some recent data might be hidden    Patient Care Team: Colon Branch, MD as PCP - [facility] as Consulting Physician (Orthopedic Surgery) Nunzio Cobbs, MD as Consulting Physician (Obstetrics and Gynecology) Solon Augusta, MD as Attending Physician (Ophthalmology) Melina Schools, MD as Consulting Physician (Orthopedic Surgery)    Assessment:   This is a routine wellness examination for [place]. Physical assessment deferred to PCP.  Exercise Activities and Dietary recommendations Current Exercise Habits: The patient does not participate in regular exercise at present, Exercise limited by: orthopedic condition(s)(back pain) Diet (meal preparation, eat out, water intake, caffeinated beverages, dairy products, fruits and vegetables): well balanced      Goals    . DIET - INCREASE WATER INTAKE    . Patient Stated (pt-stated)     Lose 25 lbs in the next year.       Fall Risk Fall Risk  07/18/2019 04/01/2019 03/15/2017 03/09/2016 12/01/2015  Falls in the past year? 0 0 No No No  Number falls in past yr: 0 0 - - -  Injury with Fall? 0 0 - - -  Follow up  Education provided;Falls prevention discussed Falls evaluation completed - - -   Depression Screen PHQ 2/9 Scores 07/18/2019 03/15/2017 03/09/2016 12/01/2015  PHQ - 2 Score 0 0 0 0     Cognitive Function Ad8 score reviewed for issues:  Issues making decisions:no  Less interest in hobbies / activities:no  Repeats questions, stories (family complaining):no  Trouble using ordinary gadgets (microwave, computer, phone):no  Forgets the month or year: no  Mismanaging finances: no  Remembering appts:no  Daily problems with thinking and/or memory:no Ad8 score is=0     MMSE - Mini Mental State Exam 12/01/2015  Orientation to time 5  Orientation to Place 5  Registration 3  Attention/ Calculation 5  Recall 2  Language- name 2 objects 2  Language- repeat 1  Language- follow 3 step command 3  Language- read & follow direction 1  Write a sentence 1  Copy design 1  Total score 29        Immunization History  Administered Date(s) Administered  . Fluad Quad(high Dose 65+) 12/13/2018  . H1N1 04/09/2008  . Influenza Split 02/28/2012  . Influenza Whole 03/04/2007, 01/29/2009, 02/07/2010  . Influenza, High Dose Seasonal PF 01/07/2015, 01/04/2016, 01/10/2017  . Influenza,inj,Quad PF,6+ Mos 01/07/2013, 02/10/2014  . Influenza,inj,quad, With Preservative 01/14/2018  . PFIZER SARS-COV-2 Vaccination 05/24/2019, 06/15/2019  . Pneumococcal Conjugate-13 08/08/2013  . Pneumococcal Polysaccharide-23 05/11/2004, 06/12/2011  . Td 02/08/2006, 09/08/2015  . Zoster 06/16/2010  . Zoster Recombinat (Shingrix) 11/21/2018, 03/13/2019   Screening Tests Health Maintenance  Topic Date Due  . INFLUENZA VACCINE  11/09/2019  . COLONOSCOPY  05/04/2020  . MAMMOGRAM  02/23/2021  . TETANUS/TDAP  09/07/2025  . DEXA SCAN  Completed  . Hepatitis C Screening  Completed  . PNA vac Low Risk Adult  Completed     Plan:    Please schedule  your next medicare wellness visit with me in 1 yr.  Continue to eat  heart healthy diet (full of fruits, vegetables, whole grains, lean protein, water--limit salt, fat, and sugar intake) and increase physical activity as tolerated.  Continue doing brain stimulating activities (puzzles, reading, adult coloring books, staying active) to keep memory sharp.   Bring a copy of your living will and/or healthcare power of attorney to your next office visit.   I have personally reviewed and noted the following in the patient's chart:   . Medical and social history . Use of alcohol, tobacco or illicit drugs  . Current medications and supplements . Functional ability and status . Nutritional status . Physical activity . Advanced directives . List of other physicians . Hospitalizations, surgeries, and ER visits in previous 12 months . Vitals . Screenings to include cognitive, depression, and falls . Referrals and appointments  In addition, I have reviewed and discussed with patient certain preventive protocols, quality metrics, and best practice recommendations. A written personalized care plan for preventive services as well as general preventive health recommendations were provided to patient.     Shela Nevin, [place]  07/18/2019

## 2019-07-18 ENCOUNTER — Encounter: Payer: Self-pay | Admitting: *Deleted

## 2019-07-18 ENCOUNTER — Ambulatory Visit (INDEPENDENT_AMBULATORY_CARE_PROVIDER_SITE_OTHER): Payer: Medicare HMO | Admitting: *Deleted

## 2019-07-18 ENCOUNTER — Other Ambulatory Visit: Payer: Self-pay

## 2019-07-18 VITALS — BP 130/80

## 2019-07-18 DIAGNOSIS — Z0000 Encounter for general adult medical examination without abnormal findings: Secondary | ICD-10-CM

## 2019-07-18 NOTE — Patient Instructions (Signed)
Please schedule your next medicare wellness visit with me in 1 yr.  Continue to eat heart healthy diet (full of fruits, vegetables, whole grains, lean protein, water--limit salt, fat, and sugar intake) and increase physical activity as tolerated.  Continue doing brain stimulating activities (puzzles, reading, adult coloring books, staying active) to keep memory sharp.   Bring a copy of your living will and/or healthcare power of attorney to your next office visit.   [name] , Thank you for taking time to come for your Medicare Wellness Visit. I appreciate your ongoing commitment to your health goals. Please review the following plan we discussed and let me know if I can assist you in the future.   These are the goals we discussed: Goals    . DIET - INCREASE WATER INTAKE    . Patient Stated (pt-stated)     Lose 25 lbs in the next year.       This is a list of the screening recommended for you and due dates:  Health Maintenance  Topic Date Due  . Flu Shot  11/09/2019  . Colon Cancer Screening  05/04/2020  . Mammogram  02/23/2021  . Tetanus Vaccine  09/07/2025  . DEXA scan (bone density measurement)  Completed  .  Hepatitis C: One time screening is recommended by Center for Disease Control  (CDC) for  adults born from [birth year] through 1965.   Completed  . Pneumonia vaccines  Completed    Preventive Care 17 Years and Older, Female Preventive care refers to lifestyle choices and visits with your health care provider that can promote health and wellness. This includes:  A yearly physical exam. This is also called an annual well check.  Regular dental and eye exams.  Immunizations.  Screening for certain conditions.  Healthy lifestyle choices, such as diet and exercise. What can I expect for my preventive care visit? Physical exam Your health care provider will check:  Height and weight. These may be used to calculate body mass index (BMI), which is a measurement that tells if  you are at a healthy weight.  Heart rate and blood pressure.  Your skin for abnormal spots. Counseling Your health care provider may ask you questions about:  Alcohol, tobacco, and drug use.  Emotional well-being.  Home and relationship well-being.  Sexual activity.  Eating habits.  History of falls.  Memory and ability to understand (cognition).  Work and work Statistician.  Pregnancy and menstrual history. What immunizations do I need?  Influenza (flu) vaccine  This is recommended every year. Tetanus, diphtheria, and pertussis (Tdap) vaccine  You may need a Td booster every 10 years. Varicella (chickenpox) vaccine  You may need this vaccine if you have not already been vaccinated. Zoster (shingles) vaccine  You may need this after age 29. Pneumococcal conjugate (PCV13) vaccine  One dose is recommended after age 46. Pneumococcal polysaccharide (PPSV23) vaccine  One dose is recommended after age 3. Measles, mumps, and rubella (MMR) vaccine  You may need at least one dose of MMR if you were born in 1957 or later. You may also need a second dose. Meningococcal conjugate (MenACWY) vaccine  You may need this if you have certain conditions. Hepatitis A vaccine  You may need this if you have certain conditions or if you travel or work in places where you may be exposed to hepatitis A. Hepatitis B vaccine  You may need this if you have certain conditions or if you travel or work in places  where you may be exposed to hepatitis B. Haemophilus influenzae type b (Hib) vaccine  You may need this if you have certain conditions. You may receive vaccines as individual doses or as more than one vaccine together in one shot (combination vaccines). Talk with your health care provider about the risks and benefits of combination vaccines. What tests do I need? Blood tests  Lipid and cholesterol levels. These may be checked every 5 years, or more frequently depending on  your overall health.  Hepatitis C test.  Hepatitis B test. Screening  Lung cancer screening. You may have this screening every year starting at age 38 if you have a 30-pack-year history of smoking and currently smoke or have quit within the past 15 years.  Colorectal cancer screening. All adults should have this screening starting at age 2 and continuing until age 60. Your health care provider may recommend screening at age 34 if you are at increased risk. You will have tests every 1-10 years, depending on your results and the type of screening test.  Diabetes screening. This is done by checking your blood sugar (glucose) after you have not eaten for a while (fasting). You may have this done every 1-3 years.  Mammogram. This may be done every 1-2 years. Talk with your health care provider about how often you should have regular mammograms.  BRCA-related cancer screening. This may be done if you have a family history of breast, ovarian, tubal, or peritoneal cancers. Other tests  Sexually transmitted disease (STD) testing.  Bone density scan. This is done to screen for osteoporosis. You may have this done starting at age 68. Follow these instructions at home: Eating and drinking  Eat a diet that includes fresh fruits and vegetables, whole grains, lean protein, and low-fat dairy products. Limit your intake of foods with high amounts of sugar, saturated fats, and salt.  Take vitamin and mineral supplements as recommended by your health care provider.  Do not drink alcohol if your health care provider tells you not to drink.  If you drink alcohol: ? Limit how much you have to 0-1 drink a day. ? Be aware of how much alcohol is in your drink. In the [place] one drink equals one 12 oz bottle of beer (355 mL), one 5 oz glass of wine (148 mL), or one 1 oz glass of hard liquor (44 mL). Lifestyle  Take daily care of your teeth and gums.  Stay active. Exercise for at least 30 minutes on 5 or  more days each week.  Do not use any products that contain nicotine or tobacco, such as cigarettes, e-cigarettes, and chewing tobacco. If you need help quitting, ask your health care provider.  If you are sexually active, practice safe sex. Use a condom or other form of protection in order to prevent STIs (sexually transmitted infections).  Talk with your health care provider about taking a low-dose aspirin or statin. What's next?  Go to your health care provider once a year for a well check visit.  Ask your health care provider how often you should have your eyes and teeth checked.  Stay up to date on all vaccines. This information is not intended to replace advice given to you by your health care provider. Make sure you discuss any questions you have with your health care provider. Document Revised: 03/21/2018 Document Reviewed: 03/21/2018 Elsevier Patient Education  2020 [employer].

## 2019-07-22 ENCOUNTER — Other Ambulatory Visit: Payer: Self-pay

## 2019-07-23 ENCOUNTER — Encounter: Payer: Self-pay | Admitting: Internal Medicine

## 2019-07-23 ENCOUNTER — Ambulatory Visit (INDEPENDENT_AMBULATORY_CARE_PROVIDER_SITE_OTHER): Payer: Medicare HMO | Admitting: Internal Medicine

## 2019-07-23 VITALS — BP 137/75 | HR 71 | Temp 95.2°F | Resp 18 | Ht 65.0 in | Wt 182.1 lb

## 2019-07-23 DIAGNOSIS — R0600 Dyspnea, unspecified: Secondary | ICD-10-CM

## 2019-07-23 DIAGNOSIS — R42 Dizziness and giddiness: Secondary | ICD-10-CM | POA: Diagnosis not present

## 2019-07-23 DIAGNOSIS — R0609 Other forms of dyspnea: Secondary | ICD-10-CM

## 2019-07-23 DIAGNOSIS — R079 Chest pain, unspecified: Secondary | ICD-10-CM | POA: Diagnosis not present

## 2019-07-23 LAB — CBC WITH DIFFERENTIAL/PLATELET
Basophils Absolute: 0 10*3/uL (ref 0.0–0.1)
Basophils Relative: 0.6 % (ref 0.0–3.0)
Eosinophils Absolute: 0.1 10*3/uL (ref 0.0–0.7)
Eosinophils Relative: 1.2 % (ref 0.0–5.0)
HCT: 40.9 % (ref 36.0–46.0)
Hemoglobin: 13.7 g/dL (ref 12.0–15.0)
Lymphocytes Relative: 23.6 % (ref 12.0–46.0)
Lymphs Abs: 1.7 10*3/uL (ref 0.7–4.0)
MCHC: 33.5 g/dL (ref 30.0–36.0)
MCV: 93.8 fl (ref 78.0–100.0)
Monocytes Absolute: 0.7 10*3/uL (ref 0.1–1.0)
Monocytes Relative: 9.2 % (ref 3.0–12.0)
Neutro Abs: 4.8 10*3/uL (ref 1.4–7.7)
Neutrophils Relative %: 65.4 % (ref 43.0–77.0)
Platelets: 249 10*3/uL (ref 150.0–400.0)
RBC: 4.37 Mil/uL (ref 3.87–5.11)
RDW: 13.5 % (ref 11.5–15.5)
WBC: 7.3 10*3/uL (ref 4.0–10.5)

## 2019-07-23 LAB — COMPREHENSIVE METABOLIC PANEL
ALT: 11 U/L (ref 0–35)
AST: 13 U/L (ref 0–37)
Albumin: 4.3 g/dL (ref 3.5–5.2)
Alkaline Phosphatase: 63 U/L (ref 39–117)
BUN: 12 mg/dL (ref 6–23)
CO2: 28 mEq/L (ref 19–32)
Calcium: 9.1 mg/dL (ref 8.4–10.5)
Chloride: 103 mEq/L (ref 96–112)
Creatinine, Ser: 0.76 mg/dL (ref 0.40–1.20)
GFR: 74.52 mL/min (ref >60.00)
Glucose, Bld: 73 mg/dL (ref 70–99)
Potassium: 4 mEq/L (ref 3.5–5.1)
Sodium: 139 mEq/L (ref 135–145)
Total Bilirubin: 0.5 mg/dL (ref 0.2–1.2)
Total Protein: 6.8 g/dL (ref 6.0–8.3)

## 2019-07-23 MED ORDER — INCRUSE ELLIPTA 62.5 MCG/INH IN AEPB: 1.0000 | INHALATION_SPRAY | Freq: Every day | RESPIRATORY_TRACT | 0 refills | Status: DC

## 2019-07-23 MED ORDER — FLUTICASONE-SALMETEROL 100-50 MCG/DOSE IN AEPB: 1.0000 | INHALATION_SPRAY | Freq: Two times a day (BID) | RESPIRATORY_TRACT | 0 refills | Status: DC

## 2019-07-23 NOTE — Progress Notes (Signed)
Subjective:    Patient ID: [name], female    DOB: July 06, 1945, 74 y.o.   MRN: [id number]  DOS:  07/23/2019 Type of visit - description: Acute, multiple symptoms  2 months history of dyspnea on exertion, typically when she walks up stairs or go to pick up the mail she gets slightly short of breath.  Symptoms are not associated with chest pain but occasional palpitation.  Also for the last 2 weeks she is getting dizzy briefly when she bends over or move her head.  No associated stroke SX such as double vision, slurred speech, facial numbness or focal weaknesses.  Also to history of chest tightness, usually when she does chores around her house, not necessarily when she go upstairs. Is a "tightness" at the right side of the chest with no radiation to the neck.  Last 5 minutes. It has been associated with some sweats but no nausea or difficulty breathing.   Review of Systems Denies fever chills No calf pain or swelling No cough or wheezing No blood in the stools No GERD type of symptoms  Past Medical History:  Diagnosis Date  . Cataract    both eyes rt. eye was removed  . Chest pain    stress test @ [facility] cardiology (-)  . Colonic polyp    bx adenomatous polyps, next scope 2010  . Fibromyalgia    see rheumatology  . Glaucoma    not glaucoma surgically removed growths on cornea  . Hypothyroidism   . Migraine   . 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 Osteoporosis    osteopenia  . Pleuritic chest pain    etiology unclear, has beeb eval by pulmonary and rheumatology (autoimmune dz?)  . Wrist fracture 2018    Past Surgical History:  Procedure Laterality Date  . COSMETIC SURGERY  04/12/2018   L upper eyelid ptosis repair, and trichophytic brow lift  . ESOPHAGOGASTRODUODENOSCOPY (EGD) WITH PROPOFOL N/A 03/27/2013   Procedure: ESOPHAGOGASTRODUODENOSCOPY (EGD) WITH PROPOFOL;  Surgeon: Milus Banister, MD;  Location: [facility];  Service: Endoscopy;  Laterality: N/A;   possible dil  . 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  . plastic surgery face  2013  . POLYPECTOMY    . right rotator cuff repair  2010  . TONSILLECTOMY      Allergies as of 07/23/2019      Reactions   Alendronate Sodium    REACTION: urticaria (hives)   Bactrim [sulfamethoxazole-trimethoprim]    Upset stomach   Contrast Media [iodinated Diagnostic Agents] Hives   Iodine Hives   Milnacipran    REACTION: increased bp, increased hr   Penicillins    REACTION: urticaria (hives)      Medication List       Accurate as of July 23, 2019 11:59 PM. If you have any questions, ask your nurse or doctor.        ALPRAZolam 0.5 MG tablet Commonly known as: XANAX Take 1 tablet (0.5 mg total) by mouth at bedtime as needed for anxiety.   aspirin EC 81 MG tablet Take 81 mg by mouth daily.   calcium carbonate 1250 (500 Ca) MG chewable tablet Commonly known as: OS-CAL Chew 1 tablet by mouth daily.   denosumab 60 MG/ML Sosy injection Commonly known as: PROLIA Inject 60 mg into the skin every 6 (six) months. Administer in upper arm, thigh, or abdomen   Estradiol 10 MCG Tabs vaginal tablet Commonly known as: Yuvafem Insert vaginally  twice weekly   Fluticasone-Salmeterol 100-50 MCG/DOSE Aepb Commonly known as: Advair Diskus Inhale 1 puff into the lungs in the morning and at bedtime. Started by: Kathlene November, MD   HYDROcodone-acetaminophen 7.5-325 MG tablet Commonly known as: NORCO Take 1 tablet by mouth 3 (three) times daily as needed for moderate pain.   Incruse Ellipta 62.5 MCG/INH Aepb Generic drug: umeclidinium bromide Inhale 1 puff into the lungs daily. Started by: Kathlene November, MD   levothyroxine 88 MCG tablet Commonly known as: SYNTHROID TAKE 1 TABLET (88 MCG TOTAL) BY MOUTH DAILY BEFORE BREAKFAST.   multivitamin capsule Take 1 capsule by mouth daily.   polycarbophil 625 MG tablet Commonly known as: FIBERCON Take 625 mg by mouth daily.   Vitamin D3 25 MCG (1000 UT)  Caps Take 1,200 Units by mouth.          Objective:   Physical Exam BP 137/75 (BP Location: Left Arm, Patient Position: Sitting, Cuff Size: Normal)   Pulse 71   Temp (!) 95.2 F (35.1 C) (Temporal)   Resp 18   Ht 5\' 5"  (1.651 m)   Wt 182 lb 2 oz (82.6 kg)   LMP 04/10/1998 (Approximate)   SpO2 100%   BMI 30.31 kg/m  General:   Well developed, NAD, BMI noted.  HEENT:  Normocephalic . Face symmetric, atraumatic.  Not pale. Neck: No JVD at 45 degrees Lungs:  CTA B Normal respiratory effort, no intercostal retractions, no accessory muscle use. Heart: RRR,  no murmur.  Abdomen:  Not distended, soft, non-tender. No rebound or rigidity.   Skin: Not pale. Not jaundice Lower extremities: no pretibial edema bilaterally  Neurologic:  alert & oriented X3.  Speech normal, gait appropriate for age and unassisted Psych--  Cognition and judgment appear intact.  Cooperative with normal attention span and concentration.  Behavior appropriate. No anxious or depressed appearing.     Assessment     Assessment Hypothyroidism GERD Migraines insmonia-- xanax prn COPD: per chest CTs, former heavy smoker, quit 2005 MS[facility]- Pain mngmt  -- back pain, 2017 had a MRI, saw Dr Durward Fortes, Rx local injection, PT --History of fibromyalgia --H/o Pleuritic chest pain, on-off, resolved  --spinal stenosis (MRI 05/2018)  Osteopenia:  Per gyn as off 2019 --T score 2011   -2.6 (intolerant to fosamax, ok w/ Boniva x years) --T score 01-2013   -2.6 --T score 02-2015  -2.3, rx ca and cit D  Glaucoma Ocular Rosacea 2010 Lung cancer screening CT 09-2015 CV: LAD  artery disease  per CT scan of the chest, CP:  admitted, stress lest low risk 05-2015.  Control CV RF   PLAN: Chest pain, DOE: The patient has a history of CAD @ LAD disease, s/p 2  low risk stress tests 05-2015 and again 12-2018. She also has emphysema per CT, currently denies cough or wheezing. EKG today: NSR, unchanged from  previous. Etiology not completely clear, recent negative stress test is reassuring. Plan: Trial with Incruse Ellipta or Advair (what ever is less expensive for the patient), ER if chest pain severe or persisting, aspirin 81, reassess in 1 month, check CMP, CBC, chest x-ray. Elevated BP?  Patient concerned about her blood pressure, BP today is very good, at home consistently in the 140s/80.  This morning actually was 126/80.  Observation. Dizziness: As described above, likely a peripheral issue.  Observation RTC 1 month  This visit occurred during the SARS-CoV-2 public health emergency.  Safety protocols were in place, including screening questions prior to the  visit, additional usage of staff PPE, and extensive cleaning of exam room while observing appropriate contact time as indicated for disinfecting solutions.

## 2019-07-23 NOTE — Patient Instructions (Signed)
Start aspirin 81 mg  Start Incruse Ellipta or Advair, what ever is less expensive  ER if severe or persistent chest pain  GO TO THE LAB : Get the blood work     [place], [place] back for a checkup in 1 month  Get your chest x-ray at our [place]  [facility]

## 2019-07-23 NOTE — Progress Notes (Signed)
Pre visit review using our clinic review tool, if applicable. No additional management support is needed unless otherwise documented below in the visit note. 

## 2019-07-24 ENCOUNTER — Other Ambulatory Visit: Payer: Self-pay

## 2019-07-24 ENCOUNTER — Ambulatory Visit (INDEPENDENT_AMBULATORY_CARE_PROVIDER_SITE_OTHER)
Admission: RE | Admit: 2019-07-24 | Discharge: 2019-07-24 | Disposition: A | Discharge status: Home / Self Care | Payer: Medicare HMO | Source: Ambulatory Visit | Attending: Internal Medicine | Admitting: Internal Medicine

## 2019-07-24 DIAGNOSIS — R079 Chest pain, unspecified: Secondary | ICD-10-CM

## 2019-07-24 DIAGNOSIS — R0602 Shortness of breath: Secondary | ICD-10-CM | POA: Diagnosis not present

## 2019-07-25 NOTE — Assessment & Plan Note (Signed)
Chest pain, DOE: The patient has a history of CAD @ LAD disease, s/p 2  low risk stress tests 05-2015 and again 12-2018. She also has emphysema per CT, currently denies cough or wheezing. EKG today: NSR, unchanged from previous. Etiology not completely clear, recent negative stress test is reassuring. Plan: Trial with Incruse Ellipta or Advair (what ever is less expensive for the patient), ER if chest pain severe or persisting, aspirin 81, reassess in 1 month, check CMP, CBC, chest x-ray. Elevated BP?  Patient concerned about her blood pressure, BP today is very good, at home consistently in the 140s/80.  This morning actually was 126/80.  Observation. Dizziness: As described above, likely a peripheral issue.  Observation RTC 1 month

## 2019-08-15 ENCOUNTER — Other Ambulatory Visit: Payer: Self-pay | Admitting: Internal Medicine

## 2019-08-15 ENCOUNTER — Other Ambulatory Visit: Payer: Self-pay

## 2019-08-15 MED ORDER — FLUTICASONE-SALMETEROL 100-50 MCG/DOSE IN AEPB: 1.0000 | INHALATION_SPRAY | Freq: Two times a day (BID) | RESPIRATORY_TRACT | 3 refills | Status: DC

## 2019-08-15 NOTE — Telephone Encounter (Signed)
Error

## 2019-08-19 ENCOUNTER — Telehealth: Payer: Self-pay | Admitting: Internal Medicine

## 2019-08-19 MED ORDER — HYDROCODONE-ACETAMINOPHEN 7.5-325 MG PO TABS: 1.0000 | ORAL_TABLET | Freq: Three times a day (TID) | ORAL | 0 refills | Status: DC | PRN

## 2019-08-19 NOTE — Telephone Encounter (Signed)
Hydrocodone refill.   Last OV: 07/23/19 Last Fill: 06/19/2019 #90 and 0RF Pt sig:1 tab tid prn UDS: 04/01/2019 Low risk

## 2019-08-19 NOTE — Telephone Encounter (Signed)
PDMP okay, Rx sent 

## 2019-08-21 DIAGNOSIS — R69 Illness, unspecified: Secondary | ICD-10-CM | POA: Diagnosis not present

## 2019-08-22 ENCOUNTER — Other Ambulatory Visit: Payer: Self-pay

## 2019-08-22 ENCOUNTER — Ambulatory Visit (INDEPENDENT_AMBULATORY_CARE_PROVIDER_SITE_OTHER): Payer: Medicare HMO | Admitting: Internal Medicine

## 2019-08-22 VITALS — BP 133/63 | HR 68 | Temp 97.8°F | Resp 12 | Ht 65.0 in | Wt 184.6 lb

## 2019-08-22 DIAGNOSIS — M7061 Trochanteric bursitis, right hip: Secondary | ICD-10-CM

## 2019-08-22 DIAGNOSIS — R079 Chest pain, unspecified: Secondary | ICD-10-CM

## 2019-08-22 DIAGNOSIS — R0609 Other forms of dyspnea: Secondary | ICD-10-CM

## 2019-08-22 DIAGNOSIS — J438 Other emphysema: Secondary | ICD-10-CM | POA: Diagnosis not present

## 2019-08-22 DIAGNOSIS — R0600 Dyspnea, unspecified: Secondary | ICD-10-CM

## 2019-08-22 DIAGNOSIS — J449 Chronic obstructive pulmonary disease, unspecified: Secondary | ICD-10-CM | POA: Insufficient documentation

## 2019-08-22 NOTE — Assessment & Plan Note (Signed)
Chest pain, DOE: See last visit, started Advair, symptoms definitely improved.  Suspect symptoms are due to COPD.  Continue same medications. COPD: See above Right trochanteric bursitis: Symptoms and exam consistent with this dx, rec ice, topical NSAIDs and judicious use of ibuprofen with GI precautions. Elevated BP?  BP is excellent today. RTC already scheduled for next month.

## 2019-08-22 NOTE — Progress Notes (Signed)
Subjective:    Patient ID: [name], female    DOB: 1945/11/23, 74 y.o.   MRN: [id number]  DOS:  08/22/2019 Type of visit - description: Follow-up See previous visit, she complained of chest pain, DOE, started inhalers, feels great. Also, 3 days ago after she turned in bed, developed right hip pain located at the lateral aspect of the hip, the area is TTP.  No actual injury.  Worse with certain movements.   Review of Systems Denies cough, wheezing  Past Medical History:  Diagnosis Date  . Cataract    both eyes rt. eye was removed  . Chest pain    stress test @ [facility] cardiology (-)  . Colonic polyp    bx adenomatous polyps, next scope 2010  . Fibromyalgia    see rheumatology  . Glaucoma    not glaucoma surgically removed growths on cornea  . Hypothyroidism   . Migraine   . 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 Osteoporosis    osteopenia  . Pleuritic chest pain    etiology unclear, has beeb eval by pulmonary and rheumatology (autoimmune dz?)  . Wrist fracture 2018    Past Surgical History:  Procedure Laterality Date  . COSMETIC SURGERY  04/12/2018   L upper eyelid ptosis repair, and trichophytic brow lift  . ESOPHAGOGASTRODUODENOSCOPY (EGD) WITH PROPOFOL N/A 03/27/2013   Procedure: ESOPHAGOGASTRODUODENOSCOPY (EGD) WITH PROPOFOL;  Surgeon: Milus Banister, MD;  Location: [facility];  Service: Endoscopy;  Laterality: N/A;  possible dil  . 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  . plastic surgery face  2013  . POLYPECTOMY    . right rotator cuff repair  2010  . TONSILLECTOMY      Allergies as of 08/22/2019      Reactions   Alendronate Sodium    REACTION: urticaria (hives)   Bactrim [sulfamethoxazole-trimethoprim]    Upset stomach   Contrast Media [iodinated Diagnostic Agents] Hives   Iodine Hives   Milnacipran    REACTION: increased bp, increased hr   Penicillins    REACTION: urticaria (hives)      Medication List       Accurate as of Aug 22, 2019 11:03 AM. If you have any questions, ask your nurse or doctor.        ALPRAZolam 0.5 MG tablet Commonly known as: XANAX Take 1 tablet (0.5 mg total) by mouth at bedtime as needed for anxiety.   aspirin EC 81 MG tablet Take 81 mg by mouth daily.   calcium carbonate 1250 (500 Ca) MG chewable tablet Commonly known as: OS-CAL Chew 1 tablet by mouth daily.   denosumab 60 MG/ML Sosy injection Commonly known as: PROLIA Inject 60 mg into the skin every 6 (six) months. Administer in upper arm, thigh, or abdomen   Estradiol 10 MCG Tabs vaginal tablet Commonly known as: Yuvafem Insert vaginally twice weekly   Fluticasone-Salmeterol 100-50 MCG/DOSE Aepb Commonly known as: Advair Diskus Inhale 1 puff into the lungs in the morning and at bedtime.   HYDROcodone-acetaminophen 7.5-325 MG tablet Commonly known as: NORCO Take 1 tablet by mouth 3 (three) times daily as needed for moderate pain.   Incruse Ellipta 62.5 MCG/INH Aepb Generic drug: umeclidinium bromide Inhale 1 puff into the lungs daily.   levothyroxine 88 MCG tablet Commonly known as: SYNTHROID TAKE 1 TABLET (88 MCG TOTAL) BY MOUTH DAILY BEFORE BREAKFAST.   multivitamin capsule Take 1 capsule by mouth daily.   polycarbophil 625  MG tablet Commonly known as: FIBERCON Take 625 mg by mouth daily.   Vitamin D3 25 MCG (1000 UT) Caps Take 1,200 Units by mouth.          Objective:   Physical Exam BP 133/63 (BP Location: Right Arm, Cuff Size: Large)   Pulse 68   Temp 97.8 F (36.6 C) (Temporal)   Resp 12   Ht 5\' 5"  (1.651 m)   Wt 184 lb 9.6 oz (83.7 kg)   LMP 04/10/1998 (Approximate)   SpO2 100%   BMI 30.72 kg/m  General:   Well developed, NAD, BMI noted. HEENT:  Normocephalic . Face symmetric, atraumatic Lungs:  CTA B Normal respiratory effort, no intercostal retractions, no accessory muscle use. Heart: RRR,  no murmur.  Lower extremities: no pretibial edema bilaterally MSK: Left hip: Normal to  rotation and palpation Right hip: Normal rotation, slightly painful with internal rotation.  Mild to moderately TTP over the trochanteric bursa Skin: Not pale. Not jaundice Neurologic:  alert & oriented X3.  Speech normal, gait appropriate for age and unassisted Psych--  Cognition and judgment appear intact.  Cooperative with normal attention span and concentration.  Behavior appropriate. No anxious or depressed appearing.      Assessment       Assessment Hypothyroidism GERD Migraines insmonia-- xanax prn COPD: per chest CTs, former heavy smoker, quit 2005 MSK- Pain mngmt  -- back pain, 2017 had a MRI, saw Dr Durward Fortes, Rx local injection, PT --History of fibromyalgia --H/o Pleuritic chest pain, on-off, resolved  --spinal stenosis (MRI 05/2018)  Osteopenia:  Per gyn as off 2019 --T score 2011   -2.6 (intolerant to fosamax, ok w/ Boniva x years) --T score 01-2013   -2.6 --T score 02-2015  -2.3, rx ca and cit D  Glaucoma Ocular Rosacea 2010 Lung cancer screening CT 09-2015 CV: LAD  artery disease  per CT scan of the chest, CP:  admitted, stress lest low risk 05-2015.  Control CV RF   PLAN: Chest pain, DOE: See last visit, started Advair, symptoms definitely improved.  Suspect symptoms are due to COPD.  Continue same medications. COPD: See above Right trochanteric bursitis: Symptoms and exam consistent with this dx, rec ice, topical NSAIDs and judicious use of ibuprofen with GI precautions. Elevated BP?  BP is excellent today. RTC already scheduled for next month.   This visit occurred during the SARS-CoV-2 public health emergency.  Safety protocols were in place, including screening questions prior to the visit, additional usage of staff PPE, and extensive cleaning of exam room while observing appropriate contact time as indicated for disinfecting solutions.

## 2019-09-16 ENCOUNTER — Telehealth: Payer: Self-pay | Admitting: Internal Medicine

## 2019-09-17 MED ORDER — HYDROCODONE-ACETAMINOPHEN 7.5-325 MG PO TABS: 1.0000 | ORAL_TABLET | Freq: Three times a day (TID) | ORAL | 0 refills | Status: DC | PRN

## 2019-09-17 NOTE — Telephone Encounter (Signed)
PDMP ok, rx sent  

## 2019-09-17 NOTE — Telephone Encounter (Signed)
Hydrocodone refill.   Last OV: 08/22/2019 Last Fill: 08/19/2019 #90 and 0RF Pt sig: 1 tab tid prn UDS: 04/01/2019 Low risk

## 2019-09-24 ENCOUNTER — Other Ambulatory Visit: Payer: Self-pay

## 2019-09-24 ENCOUNTER — Ambulatory Visit (INDEPENDENT_AMBULATORY_CARE_PROVIDER_SITE_OTHER): Payer: Medicare HMO | Admitting: Internal Medicine

## 2019-09-24 ENCOUNTER — Encounter: Payer: Self-pay | Admitting: Internal Medicine

## 2019-09-24 VITALS — BP 127/82 | HR 72 | Temp 97.5°F | Resp 18 | Ht 65.0 in | Wt 180.2 lb

## 2019-09-24 DIAGNOSIS — I2583 Coronary atherosclerosis due to lipid rich plaque: Secondary | ICD-10-CM

## 2019-09-24 DIAGNOSIS — G4700 Insomnia, unspecified: Secondary | ICD-10-CM

## 2019-09-24 DIAGNOSIS — E039 Hypothyroidism, unspecified: Secondary | ICD-10-CM

## 2019-09-24 DIAGNOSIS — I2510 Atherosclerotic heart disease of native coronary artery without angina pectoris: Secondary | ICD-10-CM

## 2019-09-24 DIAGNOSIS — M48062 Spinal stenosis, lumbar region with neurogenic claudication: Secondary | ICD-10-CM | POA: Diagnosis not present

## 2019-09-24 DIAGNOSIS — J438 Other emphysema: Secondary | ICD-10-CM

## 2019-09-24 LAB — LIPID PANEL
Cholesterol: 183 mg/dL (ref 0–200)
HDL: 51 mg/dL (ref >39.00)
LDL Cholesterol: 111 mg/dL — ABNORMAL HIGH (ref 0–99)
NonHDL: 131.97
Total CHOL/HDL Ratio: 4
Triglycerides: 107 mg/dL (ref 0.0–149.0)
VLDL: 21.4 mg/dL (ref 0.0–40.0)

## 2019-09-24 LAB — TSH: TSH: 1.58 u[IU]/mL (ref 0.35–4.50)

## 2019-09-24 NOTE — Patient Instructions (Signed)
   GO TO THE LAB : Get the blood work     GO TO THE FRONT DESK, PLEASE SCHEDULE YOUR APPOINTMENTS Come back for   a physical exam in 3 months 

## 2019-09-24 NOTE — Progress Notes (Signed)
Pre visit review using our clinic review tool, if applicable. No additional management support is needed unless otherwise documented below in the visit note. 

## 2019-09-24 NOTE — Progress Notes (Signed)
Subjective:    Patient ID: [name], female    DOB: 11/25/1945, 74 y.o.   MRN: [id number]  DOS:  09/24/2019 Type of visit - description: Routine checkup Today we talk about multiple issues including high cholesterol, thyroid disease and back pain. In general feeling well except for back pain,  reached a point that is affecting her QOL and she has been offered surgery.  Review of Systems Denies chest pain no difficulty breathing No cough or sputum production No anxiety or depression   Past Medical History:  Diagnosis Date  . Cataract    both eyes rt. eye was removed  . Chest pain    stress test @ [facility] cardiology (-)  . Colonic polyp    bx adenomatous polyps, next scope 2010  . Fibromyalgia    see rheumatology  . Glaucoma    not glaucoma surgically removed growths on cornea  . Hypothyroidism   . Migraine   . 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 Osteoporosis    osteopenia  . Pleuritic chest pain    etiology unclear, has beeb eval by pulmonary and rheumatology (autoimmune dz?)  . Wrist fracture 2018    Past Surgical History:  Procedure Laterality Date  . COSMETIC SURGERY  04/12/2018   L upper eyelid ptosis repair, and trichophytic brow lift  . ESOPHAGOGASTRODUODENOSCOPY (EGD) WITH PROPOFOL N/A 03/27/2013   Procedure: ESOPHAGOGASTRODUODENOSCOPY (EGD) WITH PROPOFOL;  Surgeon: Milus Banister, MD;  Location: [facility];  Service: Endoscopy;  Laterality: N/A;  possible dil  . 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  . plastic surgery face  2013  . POLYPECTOMY    . right rotator cuff repair  2010  . TONSILLECTOMY      Allergies as of 09/24/2019      Reactions   Alendronate Sodium    REACTION: urticaria (hives)   Bactrim [sulfamethoxazole-trimethoprim]    Upset stomach   Contrast Media [iodinated Diagnostic Agents] Hives   Iodine Hives   Milnacipran    REACTION: increased bp, increased hr   Penicillins    REACTION: urticaria (hives)      Medication  List       Accurate as of September 24, 2019  9:06 AM. If you have any questions, ask your nurse or doctor.        ALPRAZolam 0.5 MG tablet Commonly known as: XANAX Take 1 tablet (0.5 mg total) by mouth at bedtime as needed for anxiety.   aspirin EC 81 MG tablet Take 81 mg by mouth daily.   calcium carbonate 1250 (500 Ca) MG chewable tablet Commonly known as: OS-CAL Chew 1 tablet by mouth daily.   denosumab 60 MG/ML Sosy injection Commonly known as: PROLIA Inject 60 mg into the skin every 6 (six) months. Administer in upper arm, thigh, or abdomen   Estradiol 10 MCG Tabs vaginal tablet Commonly known as: Yuvafem Insert vaginally twice weekly   Fluticasone-Salmeterol 100-50 MCG/DOSE Aepb Commonly known as: Advair Diskus Inhale 1 puff into the lungs in the morning and at bedtime.   HYDROcodone-acetaminophen 7.5-325 MG tablet Commonly known as: NORCO Take 1 tablet by mouth 3 (three) times daily as needed for moderate pain.   levothyroxine 88 MCG tablet Commonly known as: SYNTHROID TAKE 1 TABLET (88 MCG TOTAL) BY MOUTH DAILY BEFORE BREAKFAST.   multivitamin capsule Take 1 capsule by mouth daily.   polycarbophil 625 MG tablet Commonly known as: FIBERCON Take 625 mg by mouth daily.  Vitamin D3 25 MCG (1000 UT) Caps Take 1,200 Units by mouth.          Objective:   Physical Exam BP 127/82 (BP Location: Left Arm, Patient Position: Sitting, Cuff Size: Small)   Pulse 72   Temp (!) 97.5 F (36.4 C) (Temporal)   Resp 18   Ht 5\' 5"  (1.651 m)   Wt 180 lb 4 oz (81.8 kg)   LMP 04/10/1998 (Approximate)   SpO2 97%   BMI 30.00 kg/m  General:   Well developed, NAD, BMI noted.  HEENT:  Normocephalic . Face symmetric, atraumatic Lungs:  CTA B Normal respiratory effort, no intercostal retractions, no accessory muscle use. Heart: RRR,  no murmur.  Abdomen:  Not distended, soft, non-tender. No rebound or rigidity.   Skin: Not pale. Not jaundice Lower extremities: no  pretibial edema bilaterally  Neurologic:  alert & oriented X3.  Speech normal, gait appropriate for age and unassisted Psych--  Cognition and judgment appear intact.  Cooperative with normal attention span and concentration.  Behavior appropriate. No anxious or depressed appearing.     Assessment     Assessment Hypothyroidism GERD Migraines insmonia-- xanax prn COPD: per chest CTs, former heavy smoker, quit 2005 [facility]- Pain mngmt  -- back pain, 2017 had a MRI, saw Dr Durward Fortes, Rx local injection, PT --History of fibromyalgia --H/o Pleuritic chest pain, on-off, resolved  --spinal stenosis (MRI 05/2018)  Osteopenia:  Per ENDO --T score 2011   -2.6 (intolerant to fosamax, ok w/ Boniva x years) --T score 01-2013   -2.6 --T score 02-2015  -2.3, rx ca and cit D  Glaucoma Ocular Rosacea 2010 CV: LAD  artery disease  per CT scan of the chest, CP:  admitted, stress lest low risk 05-2015.  Control CV RF   PLAN: Hypothyroidism: Checking a TSH, FLP CAD: LAD disease per CT, plan is to control CV RF Insomnia: On Xanax, working well. COPD: On Advair twice a day, doing great, wonders if she could drop it to once a day and we agreed to do so. Osteopenia: Per Endo Chronic back pain: Symptoms are getting worse, Q OL is now quite affected, she is often times requiring a half extra hydrocodone. Dr. Rolena Infante has seen her for a while, eventually has offered surgery, she already had a second opinion and they agree with surgery (per patient). Plan: From my side I agreed to increase hydrocodone to 4 times daily, next Rx will be 120 tablets. Preventive care reviewed in preparation of future CPX. --TD 08-2015 - Pneumonia shot 2006 and 2013 -  prevnar 2015 - s/p zostavax - s/p shingrex rx - s/p covid vaccines --Cscope 11-2005:  polyps-adenomatous, repeated  colonoscopy 11/2008 , 04-2012, 04/2017, + polyps, 3 years -- Female care per gyn   --Palpable aorta-- US neg for AAA 07-2012 --Lung cancer screening,  last CT 09/30/2018, no malignancy, CT did show LAD coronary disease( pt repports does not longer qualify) RTC 3 months   This visit occurred during the SARS-CoV-2 public health emergency.  Safety protocols were in place, including screening questions prior to the visit, additional usage of staff PPE, and extensive cleaning of exam room while observing appropriate contact time as indicated for disinfecting solutions.

## 2019-09-25 NOTE — Assessment & Plan Note (Addendum)
Hypothyroidism: Checking a TSH, FLP CAD: LAD disease per CT, plan is to control CV RF Insomnia: On Xanax, working well. COPD: On Advair twice a day, doing great, wonders if she could drop it to once a day and we agreed to do so. Osteopenia: Per Endo Chronic back pain: Symptoms are getting worse, Q OL is now quite affected, she is often times requiring a half extra hydrocodone. Dr. Rolena Infante has seen her for a while, eventually has offered surgery, she already had a second opinion and they agree with surgery (per patient). Plan: From my side I agreed to increase hydrocodone to 4 times daily, next Rx will be 120 tablets. Preventive care reviewed in preparation of future CPX. --TD 08-2015 - Pneumonia shot 2006 and 2013 -  prevnar 2015 - s/p zostavax - s/p shingrex rx - s/p covid vaccines --Cscope 11-2005:  polyps-adenomatous, repeated  colonoscopy 11/2008 , 04-2012, 04/2017, + polyps, 3 years -- Female care per gyn   --Palpable aorta-- US neg for AAA 07-2012 --Lung cancer screening, last CT 09/30/2018, no malignancy, CT did show LAD coronary disease( pt repports does not longer qualify) RTC 3 months

## 2019-09-26 MED ORDER — ATORVASTATIN CALCIUM 10 MG PO TABS: 10.0000 mg | ORAL_TABLET | Freq: Every day | ORAL | 3 refills | Status: DC

## 2019-09-26 NOTE — Addendum Note (Signed)
Addended byDamita Dunnings D on: 09/26/2019 07:47 AM   Modules accepted: Orders

## 2019-10-06 ENCOUNTER — Ambulatory Visit: Payer: Medicare HMO | Admitting: Internal Medicine

## 2019-10-06 ENCOUNTER — Encounter: Payer: Self-pay | Admitting: Internal Medicine

## 2019-10-06 ENCOUNTER — Other Ambulatory Visit: Payer: Self-pay

## 2019-10-06 VITALS — BP 110/70 | HR 68 | Ht 65.0 in | Wt 183.0 lb

## 2019-10-06 DIAGNOSIS — M810 Age-related osteoporosis without current pathological fracture: Secondary | ICD-10-CM | POA: Diagnosis not present

## 2019-10-06 NOTE — Patient Instructions (Signed)
Please stop at the lab.  Please continue Prolia, vitamin D and calcium.  Please come back for a follow-up appointment in 1 year.
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
## 2019-10-07 ENCOUNTER — Encounter: Payer: Self-pay | Admitting: Internal Medicine

## 2019-10-07 LAB — VITAMIN D 25 HYDROXY (VIT D DEFICIENCY, FRACTURES): Vit D, 25-Hydroxy: 43.6 ng/mL (ref 30.0–100.0)

## 2019-10-15 ENCOUNTER — Other Ambulatory Visit: Payer: Self-pay | Admitting: Internal Medicine

## 2019-10-15 NOTE — Telephone Encounter (Signed)
Hydrocodone refill. Pt states you two discussed increasing to qid.   Last OV: 09/24/2019 Last Fill: 09/17/2019 #90 and 0RF Pt sig: 1 tab tid prn UDS: 04/01/2019 Low risk

## 2019-10-16 MED ORDER — HYDROCODONE-ACETAMINOPHEN 7.5-325 MG PO TABS: 1.0000 | ORAL_TABLET | Freq: Four times a day (QID) | ORAL | 0 refills | Status: DC | PRN

## 2019-10-16 NOTE — Telephone Encounter (Signed)
PDMP okay, I agreed to increase prescription to 4 times daily, # 120 tablets sent

## 2019-10-17 DIAGNOSIS — M545 Low back pain: Secondary | ICD-10-CM | POA: Diagnosis not present

## 2019-10-17 DIAGNOSIS — M48061 Spinal stenosis, lumbar region without neurogenic claudication: Secondary | ICD-10-CM | POA: Diagnosis not present

## 2019-10-30 DIAGNOSIS — G8929 Other chronic pain: Secondary | ICD-10-CM | POA: Diagnosis not present

## 2019-11-04 DIAGNOSIS — M5416 Radiculopathy, lumbar region: Secondary | ICD-10-CM | POA: Diagnosis not present

## 2019-11-06 ENCOUNTER — Telehealth: Payer: Self-pay

## 2019-11-06 ENCOUNTER — Telehealth: Payer: Self-pay | Admitting: *Deleted

## 2019-11-06 NOTE — Telephone Encounter (Signed)
Received surgical clearance from Pt's husband Tom, Pt needing L2-L4 fusion w/ Dr. Rolena Infante at [facility]. Last EKG 07/2019. Please let me know if she needs surgical clearance appt.

## 2019-11-06 NOTE — Telephone Encounter (Signed)
   [facility] Pre-operative Risk Assessment    HEARTCARE STAFF: - Please ensure there is not already an duplicate clearance open for this procedure. - Under Visit Info/Reason for Call, type in Other and utilize the format Clearance MM/DD/YY or Clearance TBD. Do not use dashes or single digits. - If request is for dental extraction, please clarify the # of teeth to be extracted.  Request for surgical clearance:  1. What type of surgery is being performed? 2 DAY SURGERY: SURGERY #1 XLIF L2-4; SURGERY #2 PSFI L2-4 POSSIBLE DECOMPRESSION L2-5  2. When is this surgery scheduled? 12/03/19 & 12/04/19   3. What type of clearance is required (medical clearance vs. Pharmacy clearance to hold med vs. Both)? MEDICAL  4. Are there any medications that need to be held prior to surgery and how long? ASA    5. Practice name and name of physician performing surgery? [facility]; DR. Gilbert   6. What is the office phone number? [phone]   7.   What is the office fax number? [place]  8.   Anesthesia type (None, local, MAC, general) ? GENERAL   [name] 11/06/2019, 12:00 PM  _________________________________________________________________   (provider comments below)

## 2019-11-06 NOTE — Telephone Encounter (Signed)
Primary Cardiologist:Peter Johnsie Cancel, MD  Chart reviewed as part of pre-operative protocol coverage. Because of [name] past medical history and time since last visit, he/she will require a follow-up visit in order to better assess preoperative cardiovascular risk.  Pre-op covering staff: - Please schedule appointment and call patient to inform them. - Please contact requesting surgeon's office via preferred method (i.e, phone, fax) to inform them of need for appointment prior to surgery.  If applicable, this message will also be routed to pharmacy pool and/or primary cardiologist for input on holding anticoagulant/antiplatelet agent as requested below so that this information is available at time of patient's appointment.   [name], NP  11/06/2019, 1:51 PM

## 2019-11-06 NOTE — Telephone Encounter (Signed)
Called pt and scheduled appt for cardiac clearance 11/26/2019  2:15 PM w/Bhavinkumar Bhagat, PA Pt has been placed on cancellation list  Forwarded to requesting party via [place] fax function

## 2019-11-07 ENCOUNTER — Ambulatory Visit: Payer: Self-pay | Admitting: Orthopedic Surgery

## 2019-11-07 NOTE — Telephone Encounter (Signed)
Needs surgical clearance, seen last month, at the time did not report chest pain, cough or difficulty breathing, Back pain is affecting her QOL significantly. Patient is cleared from my standpoint, will need cardiology clearance as well.

## 2019-11-07 NOTE — Telephone Encounter (Signed)
Form faxed to ATTN: Orson Slick at [phone]. Form sent for scanning.

## 2019-11-07 NOTE — Telephone Encounter (Signed)
Received fax confirmation

## 2019-11-08 ENCOUNTER — Other Ambulatory Visit: Payer: Self-pay | Admitting: Internal Medicine

## 2019-11-10 ENCOUNTER — Other Ambulatory Visit: Payer: Self-pay

## 2019-11-10 MED ORDER — ADVAIR DISKUS 100-50 MCG/DOSE IN AEPB: 1.0000 | INHALATION_SPRAY | Freq: Two times a day (BID) | RESPIRATORY_TRACT | 3 refills | Status: DC

## 2019-11-16 ENCOUNTER — Other Ambulatory Visit: Payer: Self-pay | Admitting: Internal Medicine

## 2019-11-17 NOTE — Telephone Encounter (Signed)
Last written: 05/20/19 Last ov: 09/24/19 Next ov: 01/29/20 Contract: 03/13/18 UDS: 04/01/19

## 2019-11-17 NOTE — Telephone Encounter (Signed)
Requesting:NORCO Contract:N/A UDS:03/30/2019 Last Visit:09/24/19 Next Visit:01/29/20 Last Refill: 10/16/19  Please Advise

## 2019-11-18 ENCOUNTER — Ambulatory Visit: Payer: Self-pay | Admitting: Orthopedic Surgery

## 2019-11-18 DIAGNOSIS — M5416 Radiculopathy, lumbar region: Secondary | ICD-10-CM | POA: Diagnosis not present

## 2019-11-18 MED ORDER — HYDROCODONE-ACETAMINOPHEN 7.5-325 MG PO TABS: 1.0000 | ORAL_TABLET | Freq: Four times a day (QID) | ORAL | 0 refills | Status: DC | PRN

## 2019-11-18 NOTE — Telephone Encounter (Signed)
PDMP okay, Rx sent 

## 2019-11-18 NOTE — H&P (Deleted)
  The note originally documented on this encounter has been moved the the encounter in which it belongs.  

## 2019-11-18 NOTE — H&P (Signed)
Subjective:   [name] Is a pleasant 74 year old female with past medical history significant for COPD (former smoker), osteoporosis, thyroid problems, and chronic back pain who continues to have significant back buttock and bilateral leg pain right side worse than her symptoms are still consistent with lumbar spinal stenosis with neurogenic claudication. The pain has been ongoing and affecting her quality of late despite appropriate conservative care including physical therapy, injection therapy, over the counter medications, and narcotic pain medications. Therefore, the patient would like to move forward with surgical intervention. The patient is here today for a pre-operative History and Physical. She is scheduled for DAY 1 - XLIF L2-4, DAY 2 - PSFI L2-4 on 12-04-19 with Dr. Rolena Infante at [facility].  Patient Active Problem List   Diagnosis Date Noted  . COPD (chronic obstructive pulmonary disease) ([place]) 08/22/2019  . Spinal stenosis of lumbar region with neurogenic claudication 11/22/2018  . Coronary artery disease due to lipid rich plaque 10/03/2018  . Closed fracture of distal end of radius 04/16/2017  . Chronic back pain 03/15/2017  . PCP NOTES >>>>>>>>>>>>>>>>>>>> 05/24/2015  . Numbness and tingling of right arm 05/22/2015  . Insomnia 09/01/2014  . GERD (gastroesophageal reflux disease) 03/27/2013  . Dysphagia, pharyngoesophageal phase 03/27/2013  . Annual physical exam 06/12/2011  . Sinusitis, chronic 02/02/2011  . Osteoporosis 06/08/2010  . Fibromyalgia, pain mngmt 01/29/2009  . LIPOMA 09/30/2008  . Migraine without aura 03/04/2007  . Hypothyroidism 05/15/2006  . COLONIC POLYPS, HX OF 05/15/2006   Past Medical History:  Diagnosis Date  . Cataract    both eyes rt. eye was removed  . Chest pain    stress test @ [facility] cardiology (-)  . Colonic polyp    bx adenomatous polyps, next scope 2010  . Fibromyalgia    see rheumatology  . Glaucoma    not glaucoma surgically  removed growths on cornea  . Hypothyroidism   . Migraine   . 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 Osteoporosis    osteopenia  . Pleuritic chest pain    etiology unclear, has beeb eval by pulmonary and rheumatology (autoimmune dz?)  . Wrist fracture 2018    Past Surgical History:  Procedure Laterality Date  . COSMETIC SURGERY  04/12/2018   L upper eyelid ptosis repair, and trichophytic brow lift  . ESOPHAGOGASTRODUODENOSCOPY (EGD) WITH PROPOFOL N/A 03/27/2013   Procedure: ESOPHAGOGASTRODUODENOSCOPY (EGD) WITH PROPOFOL;  Surgeon: Milus Banister, MD;  Location: [facility] END[facility]COPY;  Service: Endoscopy;  Laterality: N/A;  possible dil  . 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  . plastic surgery face  2013  . POLYPECTOMY    . right rotator cuff repair  2010  . TONSILLECTOMY      Current Outpatient Medications  Medication Sig Dispense Refill Last Dose  . ADVAIR DISKUS 100-50 MCG/DOSE AEPB Inhale 1 puff into the lungs in the morning and at bedtime. 180 each 3   . ALPRAZolam (XANAX) 0.5 MG tablet TAKE 1 TABLET BY MOUTH AT BEDTIME AS NEEDED FOR ANXIETY. 30 tablet 5   . aspirin EC 81 MG tablet Take 81 mg by mouth daily.     Marland Kitchen atorvastatin (LIPITOR) 10 MG tablet Take 1 tablet (10 mg total) by mouth at bedtime. 30 tablet 3   . calcium carbonate (OS-CAL) 1250 (500 CA) MG chewable tablet Chew 1 tablet by mouth daily.     . Cholecalciferol (VITAMIN D3) 1000 UNITS CAPS Take 1,200 Units by mouth.     Marland Kitchen  denosumab (PROLIA) 60 MG/ML SOSY injection Inject 60 mg into the skin every 6 (six) months. Administer in upper arm, thigh, or abdomen 1 Syringe 1   . Estradiol (YUVAFEM) 10 MCG TABS vaginal tablet Insert vaginally twice weekly 24 tablet 3   . HYDROcodone-acetaminophen (NORCO) 7.5-325 MG tablet Take 1 tablet by mouth every 6 (six) hours as needed for moderate pain. 120 tablet 0   . levothyroxine (SYNTHROID) 88 MCG tablet Take 1 tablet (88 mcg total) by mouth daily before breakfast. 90 tablet 1   . Multiple  Vitamin (MULTIVITAMIN) capsule Take 1 capsule by mouth daily.       . polycarbophil (FIBERCON) 625 MG tablet Take 625 mg by mouth daily.        No current facility-administered medications for this visit.   Allergies  Allergen Reactions  . Alendronate Sodium     REACTION: urticaria (hives)  . Bactrim [Sulfamethoxazole-Trimethoprim]     Upset stomach  . Contrast Media [Iodinated Diagnostic Agents] Hives  . Iodine Hives  . Milnacipran     REACTION: increased bp, increased hr  . Penicillins     REACTION: urticaria (hives)    Social History   Tobacco Use  . Smoking status: Former Smoker    Packs/day: 1.00    Years: 38.50    Pack years: 38.50    Types: Cigarettes    Quit date: 01/13/2004    Years since quitting: 15.8  . Smokeless tobacco: Never Used  . Tobacco comment: quit 2005  Substance Use Topics  . Alcohol use: No    Alcohol/week: 0.0 standard drinks    Comment: rare    Family History  Adopted: Yes  Problem Relation Age of Onset  . Colon cancer Neg Hx   . Cancer Neg Hx   . Depression Neg Hx   . Diabetes Neg Hx   . Stroke Neg Hx   . Hypertension Neg Hx   . Heart disease Neg Hx   . Colon polyps Neg Hx     Review of Systems As stated in HPI  Objective:   Vitals: Ht: 5 ft 5.5 in 11/18/2019 10:15 am Wt: 180 lbs 11/18/2019 10:15 am BMI: 29.5 11/18/2019 10:15 am BP: 150/96 sitting L arm 11/18/2019 10:18 am Pulse: 68 bpm 11/18/2019 10:18 am  Clinical exam: Patient is alert and oriented 3. No shortness of breath, chest pain.  Psych: Normal mood, affect appropriate  Heart: Regular rate and rhythm, no rubs, murmurs, or gallops  Lungs: Mild wheeze noted on inspiration right upper lobe. Remaining loops clear to auscultation.  Abdomen: Soft and nontender. No loss of bowel and bladder control. Bowel sounds 4, no distention.  Lumbar spine: Ongoing significant low back buttock and right thigh pain. Pain is worse with extension of the spine improved with forward  flexion.  Neuro: Positive bilateral lower extremity dysesthesias worse on the right side In the lateral hip/buttock and into the groin. Negative straight leg raise test. No focal motor deficits on exam.  Peripheral vascular: Extremities warm and well perfused  X-rays of the lumbar spine continue demonstrate advanced degenerative disc sees at L2-3 moderate degenerative disease at L3-4 and degenerative scoliosis L2-4. Mild degenerative disc disease at L4-5 and L5-S1. MRI of the lumbar spine: completed on 10/30/19 was reviewed with the patient. It was completed at [facility]; I have independently reviewed the images as well as the radiology report. Moderate foraminal and central stenosis at L2-3. Facet degeneration and slight scoliosis is noted. L3-4: More advanced degenerative disc  disease with a slight anterior listhesis is noted. Severe spinal stenosis. Minimal retrolisthesis at L4-5 with mild to moderate foraminal stenosis and central stenosis. Mild spinal canal stenosis at L5-S1, moderate foraminal stenosis.  Assessment:   At this point time Deshanae continues to have severe back buttock and bilateral thigh pain. Her clinical symptoms are consistent with lumbar spinal stenosis with neurogenic claudication. Despite multiple injections, physical therapy, and observation her quality-of-life continues to deteriorate. She is expressed a desire to move forward with surgery. I have again discussed lumbar decompression versus spinal fusion. My concern with a decompression only is that the degenerative scoliosis can worsen. I believe with a lateral interbody fusion at L2-3 and L3-4 we can stabilize the scoliosis, obtain indirect lumbar decompression, and help to improve both her back pain and neurogenic claudication pain.  Plan:   I have gone over the surgical procedure in great detail with her and all of her questions were addressed. Goal of surgery is to reduce her pain and improve her quality-of-life. This  will be planned 2 stage procedure. This will be the lateral interbody fusions L2-3 and L3-4, and the second day will be posterior supplemental pedicle screw fixation and if required and possible open decompression. OLIF/XLIF risks, benefits of surgery were reviewed with the patient. These include: infection, bleeding, death, stroke, paralysis, ongoing or worse pain, need for additional surgery, injury to the lumbar plexus resulting in hip flexor weakness and difficulty walking without assistive devices. Adjacent segment degenerative disease, need for additional surgery including fusing other levels, leak of spinal fluid, Nonunion, hardware failure, breakage, or mal-position. Deep venous thrombosis (DVT) requiring additional treatment such as filter, and/or medications. Injury to abdominal contents, loss in bowel and bladder control. Risks and benefits of spinal fusion: Infection, bleeding, death, stroke, paralysis, ongoing or worse pain, need for additional surgery, nonunion, leak of spinal fluid, adjacent segment degeneration requiring additional fusion surgery, Injury to abdominal vessels that can require anterior surgery to stop bleeding. Malposition of the cage and/or pedicle screws that could require additional surgery. Loss of bowel and bladder control. Postoperative hematoma causing neurologic compression that could require urgent or emergent re-operation. We have obtained preoperative medical clearance from the patient's primary care provider who is clearing the patient as long as the patient is evaluated and cleared by cardiology. Unfortunately, at this point we do not have cardiac clearance yet, patient is scheduled to see the cardiologist on 11/26/2019.  I have reviewed the patient's medication list with her. She is not on any blood thinners. She does take a baby aspirin daily. I did advise her that our recommendation is to hold the baby aspirin 7 days prior to surgery and restart 48 hours after as  long as the cardiologist is okay/in agreement with this. Additionally, we discussed discontinuation of any anti-inflammatory medications 7 days prior to surgery. She did express understanding of this.  We have also discussed the post-operative recovery period to include: bathing/showering restrictions, wound healing, activity (and driving) restrictions, medications/pain mangement. Patient is on chronic opioids hydrocodone/acetaminophen 7.5/325 4 times a day as prescribed by her primary care provider. I had a long discussion with the patient that this will ultimately make her postoperative pain more difficult to treat. I discussed that typically we provide our patients with oxycodone/acetaminophen 10/325 Approximately 4 times a day in the immediate postop period, and that this is just slightly stronger than what she is currently on on a regular basis and therefore she may require something stronger than what  we typically provide during the postoperative period. I therefore have recommended she discuss postoperative pain management with her primary care provider who typically prescribes her pain medication to determine if they will be willing to manage the patient's pain postoperatively.  We have also discussed post-operative redflags to include: signs and symptoms of postoperative infection, DVT/PE.  Patient is scheduled to meet with physical therapy following this appointment to get fitted for her LSO brace.  Patient and her husband were at today's office visit, all of their questions were invited and answered  Follow-up: 2 weeks postop

## 2019-11-18 NOTE — Telephone Encounter (Signed)
Prescription sent

## 2019-11-19 ENCOUNTER — Telehealth: Payer: Self-pay

## 2019-11-19 NOTE — Telephone Encounter (Signed)
Due to [place] Portal being down, patient was submitted for verification for [employer] over the phone with [place] customer service rep named Raquel Sarna. Pt has been successfully submitted.

## 2019-11-20 NOTE — Telephone Encounter (Signed)
Our office received another clearance request today for pt upcoming surgery 8/25 & 12/04/19. See previous notes about clearance pt needs appt. Pt has been scheduled 11/26/19 for pre op clearance. Looks like notes with information about appt needed with cardiologist was sent to requesting office 11/06/19. I left a message today for Sherri, surgery scheduler for Dr. Rolena Infante in regards to pt has appt 11/26/19. I will send updated notes from today's call as well to requesting office. If any questions please call [phone].

## 2019-11-23 NOTE — Progress Notes (Signed)
Cardiology Office Note:    Date:  11/24/2019   ID:  RENE GONSOULIN, DOB 1945-08-07, MRN [id number]  PCP:  Colon Branch, MD  Cardiologist:  Jenkins Rouge, MD   Electrophysiologist:  None   Referring MD: Colon Branch, MD   Chief Complaint:  Surgical Clearance    Patient Profile:    [name] Black is a 74 y.o. female with:   Hypothyroidism  Hyperlipidemia   Migraine HAs  Fibromyalgia   Chest pain, Palpitations    Evaluation in 1/17 >> Rare PVCs on tele; Myoview neg for ischemia   Ex-smoker  Coronary artery Ca2+ on CT 09/2018  Myoview 12/2018: low risk   Aortic atherosclerosis  COPD  Prior CV studies: Myoview 12/23/2018 EF 74, no ischemia; low risk   History of Present Illness:    Ms. [name] was last seen in 12/2018 for surgical clearance prior to back surgery.  She returns again for surgical clearance.  She needs lumbar spine surgery 8/25 and 8/26 with Dr. Melina Schools.    She is here alone.  She had some chest discomfort some months ago but this resolved with starting on inhalers for COPD.  She has not had exertional chest discomfort or significant shortness of breath.  She has not had syncope, orthopnea or leg swelling.  Her back limits her but she is able to do some activities.  Past Medical History:  Diagnosis Date  . Cataract    both eyes rt. eye was removed  . Chest pain    stress test @ [facility] cardiology (-)  . Colonic polyp    bx adenomatous polyps, next scope 2010  . Fibromyalgia    see rheumatology  . Glaucoma    not glaucoma surgically removed growths on cornea  . Hypothyroidism   . Migraine   . 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 Osteoporosis    osteopenia  . Pleuritic chest pain    etiology unclear, has beeb eval by pulmonary and rheumatology (autoimmune dz?)  . Wrist fracture 2018    Current Medications: Current Meds  Medication Sig  . ADVAIR DISKUS 100-50 MCG/DOSE AEPB Inhale 1 puff into the lungs in the morning and at bedtime.  .  ALPRAZolam (XANAX) 0.5 MG tablet TAKE 1 TABLET BY MOUTH AT BEDTIME AS NEEDED FOR ANXIETY.  Marland Kitchen aspirin EC 81 MG tablet Take 81 mg by mouth daily.  Marland Kitchen atorvastatin (LIPITOR) 10 MG tablet Take 1 tablet (10 mg total) by mouth at bedtime.  . calcium carbonate (OS-CAL) 1250 (500 CA) MG chewable tablet Chew 1 tablet by mouth 2 (two) times daily.   . Calcium Polycarbophil 625 MG CHEW Chew 625 mg by mouth daily.   . Cholecalciferol (VITAMIN D3) 50 MCG (2000 UT) CHEW Chew 2,000 Units by mouth daily.   Marland Kitchen denosumab (PROLIA) 60 MG/ML SOSY injection Inject 60 mg into the skin every 6 (six) months. Administer in upper arm, thigh, or abdomen  . Estradiol (YUVAFEM) 10 MCG TABS vaginal tablet Insert vaginally twice weekly  . HYDROcodone-acetaminophen (NORCO) 7.5-325 MG tablet Take 1 tablet by mouth every 6 (six) hours as needed for moderate pain.  . hydroxypropyl methylcellulose / hypromellose (ISOPTO TEARS / GONIOVISC) 2.5 % ophthalmic solution Place 1 drop into both eyes 3 (three) times daily as needed for dry eyes.  Marland Kitchen levothyroxine (SYNTHROID) 88 MCG tablet Take 1 tablet (88 mcg total) by mouth daily before breakfast.  . Multiple Vitamin (MULTIVITAMIN) capsule Take 1 capsule by mouth daily.    Marland Kitchen  Probiotic Product (PROBIOTIC-10) CHEW Chew 1 tablet by mouth daily.  . vitamin B-12 (CYANOCOBALAMIN) 1000 MCG tablet Take 1,000 mcg by mouth daily.     Allergies:   Alendronate sodium, Bactrim [sulfamethoxazole-trimethoprim], Contrast media [iodinated diagnostic agents], Iodine, Milnacipran, and Penicillins   Social History   Tobacco Use  . Smoking status: Former Smoker    Packs/day: 1.00    Years: 38.50    Pack years: 38.50    Types: Cigarettes    Quit date: 01/13/2004    Years since quitting: 15.8  . Smokeless tobacco: Never Used  . Tobacco comment: quit 2005  Vaping Use  . Vaping Use: Never used  Substance Use Topics  . Alcohol use: No    Alcohol/week: 0.0 standard drinks    Comment: rare  . Drug use: No      Family Hx: The patient's family history is negative for Colon cancer, Cancer, Depression, Diabetes, Stroke, Hypertension, Heart disease, and Colon polyps. She was adopted.  ROS   EKGs/Labs/Other Test Reviewed:    EKG:  EKG is  ordered today.  The ekg ordered today demonstrates normal sinus rhythm, heart rate 71, normal axis, no ST-T wave changes, QTC 454  Recent Labs: 07/23/2019: ALT 11; BUN 12; Creatinine, Ser 0.76; Hemoglobin 13.7; Platelets 249.0; Potassium 4.0; Sodium 139 09/24/2019: TSH 1.58   Recent Lipid Panel Lab Results  Component Value Date/Time   CHOL 183 09/24/2019 09:57 AM   TRIG 107.0 09/24/2019 09:57 AM   TRIG 133 02/21/2006 09:54 AM   HDL 51.00 09/24/2019 09:57 AM   CHOLHDL 4 09/24/2019 09:57 AM   LDLCALC 111 (H) 09/24/2019 09:57 AM   LDLDIRECT 92.0 03/04/2007 09:33 AM    Physical Exam:    VS:  BP 100/70   Pulse 71   Ht 5\' 5"  (1.651 m)   Wt 183 lb (83 kg)   LMP 04/10/1998 (Approximate)   SpO2 98%   BMI 30.45 kg/m     Wt Readings from Last 3 Encounters:  11/24/19 183 lb (83 kg)  10/06/19 183 lb (83 kg)  09/24/19 180 lb 4 oz (81.8 kg)     Constitutional:      Appearance: Healthy appearance. Not in distress.  Neck:     Vascular: JVD normal.  Pulmonary:     Effort: Pulmonary effort is normal.     Breath sounds: No wheezing. No rales.  Cardiovascular:     Normal rate. Regular rhythm. Normal S1. Normal S2.     Murmurs: There is no murmur.  Edema:    Peripheral edema absent.  Abdominal:     Palpations: Abdomen is soft.  Skin:    General: Skin is warm and dry.  Neurological:     Mental Status: Alert and oriented to person, place and time.     Cranial Nerves: Cranial nerves are intact.       ASSESSMENT & PLAN:    1. Preoperative cardiovascular examination   Ms. [name]'s perioperative risk of a major cardiac event is 0.4% according to the Revised Cardiac Risk Index (RCRI).  Therefore, she is at low risk for perioperative complications.   Her  functional capacity is fair at 4.3 METs according to the Duke Activity Status Index (DASI). Recommendations: According to ACC/AHA guidelines, no further cardiovascular testing needed.  The patient may proceed to surgery at acceptable risk.   Antiplatelet and/or Anticoagulation Recommendations: Aspirin can be held for 7 days prior to her surgery.  Please resume Aspirin post operatively when it is felt to  be safe from a bleeding standpoint.   2. Coronary artery disease due to lipid rich plaque She has a prior CT scanning with evidence of chronic calcification.  She had a normal Myoview in September 2020.  She is not having anginal symptoms.  As noted, she is at low risk for surgery may proceed.  Continue aspirin, statin.  Follow-up with Dr. Johnsie Cancel 1 year.  3. Hyperlipidemia, unspecified hyperlipidemia type Managed by primary care.  4. Aortic atherosclerosis (HCC) Continue aspirin, statin.    Dispo:  No follow-ups on file.   Medication Adjustments/Labs and Tests Ordered: Current medicines are reviewed at length with the patient today.  Concerns regarding medicines are outlined above.  Tests Ordered: Orders Placed This Encounter  Procedures  . EKG 12-Lead   Medication Changes: No orders of the defined types were placed in this encounter.   Signed, Richardson Dopp, PA-C  11/24/2019 10:52 AM    [facility] [address] [place], [place], [place]  [address] Phone: [phone]; Fax: [phone]

## 2019-11-24 ENCOUNTER — Encounter: Payer: Self-pay | Admitting: Physician Assistant

## 2019-11-24 ENCOUNTER — Other Ambulatory Visit: Payer: Self-pay

## 2019-11-24 ENCOUNTER — Ambulatory Visit: Payer: Medicare HMO | Admitting: Physician Assistant

## 2019-11-24 VITALS — BP 100/70 | HR 71 | Ht 65.0 in | Wt 183.0 lb

## 2019-11-24 DIAGNOSIS — I2510 Atherosclerotic heart disease of native coronary artery without angina pectoris: Secondary | ICD-10-CM

## 2019-11-24 DIAGNOSIS — E785 Hyperlipidemia, unspecified: Secondary | ICD-10-CM

## 2019-11-24 DIAGNOSIS — Z01810 Encounter for preprocedural cardiovascular examination: Secondary | ICD-10-CM

## 2019-11-24 DIAGNOSIS — I2583 Coronary atherosclerosis due to lipid rich plaque: Secondary | ICD-10-CM

## 2019-11-24 DIAGNOSIS — I700 Atherosclerosis of aorta: Secondary | ICD-10-CM

## 2019-11-24 NOTE — Patient Instructions (Signed)
Medication Instructions:  Your physician recommends that you continue on your current medications as directed. Please refer to the Current Medication list given to you today.  *If you need a refill on your cardiac medications before your next appointment, please call your pharmacy*  Lab Work: None ordered today  Testing/Procedures: None ordered today  Follow-Up: At [facility], you and your health needs are our priority.  As part of our continuing mission to provide you with exceptional heart care, we have created designated Provider Care Teams.  These Care Teams include your primary Cardiologist (physician) and Advanced Practice Providers (APPs -  Physician Assistants and Nurse Practitioners) who all work together to provide you with the care you need, when you need it.  We recommend signing up for the patient portal called "MyChart".  Sign up information is provided on this After Visit Summary.  MyChart is used to connect with patients for Virtual Visits (Telemedicine).  Patients are able to view lab/test results, encounter notes, upcoming appointments, etc.  Non-urgent messages can be sent to your provider as well.   To learn more about what you can do with MyChart, go to [email or web address].    Your next appointment:   12 month(s)  The format for your next appointment:   In Person  Provider:   Jenkins Rouge, MD

## 2019-11-26 ENCOUNTER — Ambulatory Visit: Payer: Medicare HMO | Admitting: Physician Assistant

## 2019-11-26 ENCOUNTER — Encounter: Payer: Self-pay | Admitting: Internal Medicine

## 2019-12-01 ENCOUNTER — Other Ambulatory Visit: Payer: Self-pay

## 2019-12-01 ENCOUNTER — Ambulatory Visit (HOSPITAL_COMMUNITY)
Admission: RE | Admit: 2019-12-01 | Discharge: 2019-12-01 | Disposition: A | Discharge status: Home / Self Care | Payer: Medicare HMO | Source: Ambulatory Visit | Attending: Orthopedic Surgery | Admitting: Orthopedic Surgery

## 2019-12-01 ENCOUNTER — Encounter (HOSPITAL_COMMUNITY): Payer: Self-pay

## 2019-12-01 ENCOUNTER — Encounter (HOSPITAL_COMMUNITY)
Admission: RE | Admit: 2019-12-01 | Discharge: 2019-12-01 | Disposition: A | Discharge status: Home / Self Care | Payer: Medicare HMO | Source: Ambulatory Visit | Attending: Orthopedic Surgery | Admitting: Orthopedic Surgery

## 2019-12-01 ENCOUNTER — Other Ambulatory Visit (HOSPITAL_COMMUNITY)
Admission: RE | Admit: 2019-12-01 | Discharge: 2019-12-01 | Disposition: A | Discharge status: Home / Self Care | Payer: Medicare HMO | Source: Ambulatory Visit | Attending: Orthopedic Surgery | Admitting: Orthopedic Surgery

## 2019-12-01 DIAGNOSIS — Z20822 Contact with and (suspected) exposure to covid-19: Secondary | ICD-10-CM | POA: Insufficient documentation

## 2019-12-01 DIAGNOSIS — Z01818 Encounter for other preprocedural examination: Secondary | ICD-10-CM | POA: Insufficient documentation

## 2019-12-01 HISTORY — DX: Chronic obstructive pulmonary disease, unspecified: J44.9

## 2019-12-01 LAB — URINALYSIS, ROUTINE W REFLEX MICROSCOPIC
Bacteria, UA: NONE SEEN
Bilirubin Urine: NEGATIVE
Glucose, UA: NEGATIVE mg/dL
Ketones, ur: NEGATIVE mg/dL
Leukocytes,Ua: NEGATIVE
Nitrite: NEGATIVE
Protein, ur: NEGATIVE mg/dL
Specific Gravity, Urine: 1.008 (ref 1.005–1.030)
pH: 5 (ref 5.0–8.0)

## 2019-12-01 LAB — CBC
HCT: 43 % (ref 36.0–46.0)
Hemoglobin: 13.4 g/dL (ref 12.0–15.0)
MCH: 30.4 pg (ref 26.0–34.0)
MCHC: 31.2 g/dL (ref 30.0–36.0)
MCV: 97.5 fL (ref 80.0–100.0)
Platelets: 238 10*3/uL (ref 150–400)
RBC: 4.41 MIL/uL (ref 3.87–5.11)
RDW: 12.7 % (ref 11.5–15.5)
WBC: 8.4 10*3/uL (ref 4.0–10.5)
nRBC: 0 % (ref 0.0–0.2)

## 2019-12-01 LAB — BASIC METABOLIC PANEL
Anion gap: 10 (ref 5–15)
BUN: 14 mg/dL (ref 8–23)
CO2: 26 mmol/L (ref 22–32)
Calcium: 9.2 mg/dL (ref 8.9–10.3)
Chloride: 102 mmol/L (ref 98–111)
Creatinine, Ser: 0.81 mg/dL (ref 0.44–1.00)
GFR calc Af Amer: >60 mL/min (ref >60)
GFR calc non Af Amer: >60 mL/min (ref >60)
Glucose, Bld: 79 mg/dL (ref 70–99)
Potassium: 3.6 mmol/L (ref 3.5–5.1)
Sodium: 138 mmol/L (ref 135–145)

## 2019-12-01 LAB — SURGICAL PCR SCREEN
MRSA, PCR: NEGATIVE
Staphylococcus aureus: NEGATIVE

## 2019-12-01 LAB — SARS CORONAVIRUS 2 (TAT 6-24 HRS): SARS Coronavirus 2: NEGATIVE

## 2019-12-01 LAB — APTT: aPTT: 39 seconds — ABNORMAL HIGH (ref 24–36)

## 2019-12-01 LAB — PROTIME-INR
INR: 1 (ref 0.8–1.2)
Prothrombin Time: 12.5 seconds (ref 11.4–15.2)

## 2019-12-01 LAB — TYPE AND SCREEN
ABO/RH(D): A NEG
Antibody Screen: NEGATIVE

## 2019-12-01 NOTE — Progress Notes (Signed)
[id number] [address], [place] [address] [place] [address] Phone: [phone] Fax: [phone]      Your procedure is scheduled on Wednesday 12/03/2019.  Report to [facility] Main Entrance "A" at 05:30 A.M., and check in at the Admitting office.  Call this number if you have problems the morning of surgery:  [phone]  Call [phone] if you have any questions prior to your surgery date Monday-Friday 8am-4pm    Remember:  Do not eat or drink after midnight the night before your surgery    Take these medicines the morning of surgery with A SIP OF WATER: Hydrocodone-acetaminophen (Norco) - if needed for pain Eye drops - if needed for dry eyes Levothyroxine (Synthroid)   As of today, STOP taking any Aspirin (unless otherwise instructed by your surgeon) Aleve, Naproxen, Ibuprofen, Motrin, Advil, Goody's, BC's, all herbal medications, fish oil, and all vitamins.  Follow your surgeon's instructions on when to stop Aspirin.  If no instructions were given by your surgeon then you will need to call the office to get those instructions.                         Do not wear jewelry, make up, or nail polish            Do not wear lotions, powders, perfumes, or deodorant.            Do not shave 48 hours prior to surgery.  Men may shave face and neck.            Do not bring valuables to the hospital.            [facility] is not responsible for any belongings or valuables.  Do NOT Smoke (Tobacco/Vaping) or drink Alcohol 24 hours prior to your procedure  If you use a CPAP at night, you may bring all equipment for your overnight stay.   Contacts, glasses, dentures or bridgework may not be worn into surgery.      For patients admitted to the hospital, discharge time will be determined by your treatment team.   Patients discharged the day of surgery will not be allowed to drive home, and someone needs to stay with them for 24  hours.    Special instructions:   [place]- Preparing For Surgery  Before surgery, you can play an important role. Because skin is not sterile, your skin needs to be as free of germs as possible. You can reduce the number of germs on your skin by washing with CHG (chlorahexidine gluconate) Soap before surgery.  CHG is an antiseptic cleaner which kills germs and bonds with the skin to continue killing germs even after washing.    Oral Hygiene is also important to reduce your risk of infection.  Remember - BRUSH YOUR TEETH THE MORNING OF SURGERY WITH YOUR REGULAR TOOTHPASTE  Please do not use if you have an allergy to CHG or antibacterial soaps. If your skin becomes reddened/irritated stop using the CHG.  Do not shave (including legs and underarms) for at least 48 hours prior to first CHG shower. It is OK to shave your face.  Please follow these instructions carefully.   1. Shower the NIGHT BEFORE SURGERY and the MORNING OF SURGERY with CHG Soap.   2. If you chose to wash your hair, wash your hair first as usual with your normal shampoo.  3. After you shampoo, rinse your hair and  body thoroughly to remove the shampoo.  4. Use CHG as you would any other liquid soap. You can apply CHG directly to the skin and wash gently with a scrungie or a clean washcloth.   5. Apply the CHG Soap to your body ONLY FROM THE NECK DOWN.  Do not use on open wounds or open sores. Avoid contact with your eyes, ears, mouth and genitals (private parts). Wash Face and genitals (private parts)  with your normal soap.   6. Wash thoroughly, paying special attention to the area where your surgery will be performed.  7. Thoroughly rinse your body with warm water from the neck down.  8. DO NOT shower/wash with your normal soap after using and rinsing off the CHG Soap.  9. Pat yourself dry with a CLEAN TOWEL.  10. Wear CLEAN PAJAMAS to bed the night before surgery  11. Place CLEAN SHEETS on your bed the night of  your first shower and DO NOT SLEEP WITH PETS.   Day of Surgery: Shower with CHG soap as directed. Wear Clean/Comfortable clothing the morning of surgery Do not apply any deodorants/lotions.   Remember to brush your teeth WITH YOUR REGULAR TOOTHPASTE.   Please read over the following fact sheets that you were given.

## 2019-12-01 NOTE — Progress Notes (Addendum)
PCP - Dr. Larose Kells Cardiologist -  Dr. Johnsie Cancel  PPM/ICD - n/a Device Orders -  Rep Notified -   Chest x-ray - 12/01/19 EKG - 11/24/2019 Stress Test - 12/24/2018 ECHO - 02/25/2004 Cardiac Cath - patient denies  Sleep Study - patient denies CPAP -   Fasting Blood Sugar - n/a Checks Blood Sugar _____ times a day  Blood Thinner Instructions: n/a Aspirin Instructions: last dose 11/24/2019  ERAS Protcol - n/a PRE-SURGERY Ensure or G2-  NPO p MN per MD order  COVID TEST- after PAT appointment   Anesthesia review: yes, per MD order, hx of cardiac testing.  Patient denies shortness of breath, fever, cough and chest pain at PAT appointment   All instructions explained to the patient, with a verbal understanding of the material. Patient agrees to go over the instructions while at home for a better understanding. Patient also instructed to self quarantine after being tested for COVID-19. The opportunity to ask questions was provided.  Michelle Alberts, PA for Dr. Rolena Infante made aware patient has abnormal UA.

## 2019-12-02 NOTE — Anesthesia Preprocedure Evaluation (Addendum)
Anesthesia Evaluation  Patient identified by MRN, date of birth, ID band Patient awake    Reviewed: Allergy & Precautions, NPO status , Patient's Chart, lab work & pertinent test results  Airway Mallampati: II  TM Distance: >3 FB Neck ROM: Full    Dental  (+) Teeth Intact, Dental Advisory Given   Pulmonary former smoker,    breath sounds clear to auscultation       Cardiovascular  Rhythm:Regular Rate:Normal     Neuro/Psych    GI/Hepatic   Endo/Other    Renal/GU      Musculoskeletal   Abdominal   Peds  Hematology   Anesthesia Other Findings   Reproductive/Obstetrics                            Anesthesia Physical Anesthesia Plan  ASA: II  Anesthesia Plan: General   Post-op Pain Management:    Induction: Intravenous  PONV Risk Score and Plan: Ondansetron and Dexamethasone  Airway Management Planned: Oral ETT  Additional Equipment:   Intra-op Plan:   Post-operative Plan: Extubation in OR  Informed Consent: I have reviewed the patients History and Physical, chart, labs and discussed the procedure including the risks, benefits and alternatives for the proposed anesthesia with the patient or authorized representative who has indicated his/her understanding and acceptance.     Dental advisory given  Plan Discussed with: CRNA and Anesthesiologist  Anesthesia Plan Comments: (PAT note written 12/02/2019 by Myra Gianotti, PA-C. )       Anesthesia Quick Evaluation

## 2019-12-02 NOTE — Progress Notes (Signed)
Anesthesia Chart Review:  Case: [id number] Date/Time: 12/03/19 0715   Procedure: ANTERIOR LATERAL LUMBAR FUSION L2-4 (N/A ) - 4 hrs   Anesthesia type: General   Pre-op diagnosis: Degenerative scoliosis with spinal stenosis   Location: [facility] OR ROOM 04 / [place] OR   Surgeons: Melina Schools, MD    She is also scheduled for posterior spinal fusion L2-4 with possible decompression L2-5 on 12/04/2019.  DISCUSSION: Patient is a 74 year old female scheduled for the above procedure.  History includes former smoker (quit 01/13/04), coronary calcifications (on 09/2018 CT, normal stress test 12/2018), palpitations (2017, "Rare PVCs on tele"), aortic atherosclerosis, hypothyroidism, HLD, migraines, fibromyalgia, COPD. BMI is consistent with mild obesity.  Last cardiology evaluation 11/24/2019 by Richardson Dopp, PA-C for preoperative evaluation. "Preoperative cardiovascular examination   Ms. Worland's perioperative risk of a major cardiac event is 0.4% according to the Revised Cardiac Risk Index (RCRI).  Therefore, she is at low risk for perioperative complications.   Her functional capacity is fair at 4.3 METs according to the Duke Activity Status Index (DASI). Recommendations: According to ACC/AHA guidelines, no further cardiovascular testing needed.  The patient may proceed to surgery at acceptable risk.   Antiplatelet and/or Anticoagulation Recommendations: Aspirin can be held for 7 days prior to her surgery.  Please resume Aspirin post operatively when it is felt to be safe from a bleeding standpoint."  Last ASA 11/24/19.   12/01/2019 presurgical COVID-19 test was negative.  Anesthesia team to evaluate on the day of surgery.  VS: BP 137/72   Pulse 66   Temp 36.4 C (Oral)   Resp 20   Ht 5\' 5"  (1.651 m)   Wt 83.5 kg   LMP 04/10/1998 (Approximate)   SpO2 100%   BMI 30.62 kg/m   PROVIDERS: Colon Branch, MD is PCP  Jenkins Rouge, MD is cardiologist   LABS: Labs reviewed: Acceptable for surgery. PTT 39 (37 4  years ago). She is not on any blood thinners. Would defer decision to repeat PTT to surgeon.  (all labs ordered are listed, but only abnormal results are displayed)  Labs Reviewed  APTT - Abnormal; Notable for the following components:      Result Value   aPTT 39 (*)    All other components within normal limits  URINALYSIS, ROUTINE W REFLEX MICROSCOPIC - Abnormal; Notable for the following components:   Color, Urine STRAW (*)    Hgb urine dipstick SMALL (*)    All other components within normal limits  SURGICAL PCR SCREEN  BASIC METABOLIC PANEL  CBC  PROTIME-INR  TYPE AND SCREEN     IMAGES: CXR 12/01/19: FINDINGS: The lungs are clear. Heart size is normal. No pneumothorax or pleural effusion. No acute or focal bony abnormality. Scoliosis noted. IMPRESSION: No acute disease.   EKG: 11/24/19: NSR   CV: Nuclear stress test 12/23/18:  Nuclear stress EF: 74%.  There was no ST segment deviation noted during stress.  The study is normal.  This is a low risk study.  The left ventricular ejection fraction is hyperdynamic (>65%).  No ischemia.    Echo 02/25/04: SUMMARY  - Abnormal septal bounce noted in limited views, this could be     consistent with right sided volume/pressure overload or     intraventricular conduction delay. Overall left ventricular     systolic function was normal. Left ventricular ejection     fraction was estimated , range being 55 % to 60 %..  - There was mild mitral  valvular regurgitation.  - The inferior vena cava was mildly dilated. Respirophasic inferior     vena cava changes were blunted (less than 50% variation).  - There was a trivial pericardial effusion posterior to the heart.     There was a large, left pleural effusion.    Past Medical History:  Diagnosis Date  . Cataract    both eyes rt. eye was removed  . Chest pain    stress test @ [facility] cardiology (-)  . Colonic polyp    bx adenomatous  polyps, next scope 2010  . COPD (chronic obstructive pulmonary disease) ([place])   . Fibromyalgia    see rheumatology  . Glaucoma    not glaucoma surgically removed growths on cornea  . Hypothyroidism   . Migraine   . 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 Osteoporosis    osteopenia  . Pleuritic chest pain    etiology unclear, has beeb eval by pulmonary and rheumatology (autoimmune dz?)  . Wrist fracture 2018    Past Surgical History:  Procedure Laterality Date  . COSMETIC SURGERY  04/12/2018   L upper eyelid ptosis repair, and trichophytic brow lift  . ESOPHAGOGASTRODUODENOSCOPY (EGD) WITH PROPOFOL N/A 03/27/2013   Procedure: ESOPHAGOGASTRODUODENOSCOPY (EGD) WITH PROPOFOL;  Surgeon: Milus Banister, MD;  Location: [facility] ENDOSCOPY;  Service: Endoscopy;  Laterality: N/A;  possible dil  . 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  . plastic surgery face  2013  . POLYPECTOMY    . right rotator cuff repair  2010  . TONSILLECTOMY      MEDICATIONS: . ADVAIR DISKUS 100-50 MCG/DOSE AEPB  . ALPRAZolam (XANAX) 0.5 MG tablet  . aspirin EC 81 MG tablet  . atorvastatin (LIPITOR) 10 MG tablet  . calcium carbonate (OS-CAL) 1250 (500 CA) MG chewable tablet  . Calcium Polycarbophil 625 MG CHEW  . Cholecalciferol (VITAMIN D3) 50 MCG (2000 UT) CHEW  . denosumab (PROLIA) 60 MG/ML SOSY injection  . Estradiol (YUVAFEM) 10 MCG TABS vaginal tablet  . HYDROcodone-acetaminophen (NORCO) 7.5-325 MG tablet  . hydroxypropyl methylcellulose / hypromellose (ISOPTO TEARS / GONIOVISC) 2.5 % ophthalmic solution  . levothyroxine (SYNTHROID) 88 MCG tablet  . Multiple Vitamin (MULTIVITAMIN) capsule  . Probiotic Product (PROBIOTIC-10) CHEW  . vitamin B-12 (CYANOCOBALAMIN) 1000 MCG tablet   No current facility-administered medications for this encounter.    Myra Gianotti, PA-C Surgical Short Stay/Anesthesiology [facility] Phone [phone] [facility] Phone [phone] 12/02/2019 11:54 AM

## 2019-12-03 ENCOUNTER — Inpatient Hospital Stay (HOSPITAL_COMMUNITY): Payer: Medicare HMO

## 2019-12-03 ENCOUNTER — Inpatient Hospital Stay (HOSPITAL_COMMUNITY): Payer: Medicare HMO | Admitting: Anesthesiology

## 2019-12-03 ENCOUNTER — Inpatient Hospital Stay (HOSPITAL_COMMUNITY): Payer: Medicare HMO | Admitting: Vascular Surgery

## 2019-12-03 ENCOUNTER — Inpatient Hospital Stay (HOSPITAL_COMMUNITY)
Admission: RE | Admit: 2019-12-03 | Discharge: 2019-12-06 | DRG: 458 | Disposition: A | Discharge status: Home Health | Payer: Medicare HMO | Attending: Orthopedic Surgery | Admitting: Orthopedic Surgery

## 2019-12-03 ENCOUNTER — Encounter (HOSPITAL_COMMUNITY)
Admission: RE | Disposition: A | Discharge status: Home / Self Care | Payer: Self-pay | Source: Home / Self Care | Attending: Orthopedic Surgery

## 2019-12-03 ENCOUNTER — Other Ambulatory Visit: Payer: Self-pay

## 2019-12-03 ENCOUNTER — Encounter (HOSPITAL_COMMUNITY): Payer: Self-pay | Admitting: Orthopedic Surgery

## 2019-12-03 DIAGNOSIS — J449 Chronic obstructive pulmonary disease, unspecified: Secondary | ICD-10-CM | POA: Diagnosis present

## 2019-12-03 DIAGNOSIS — M4156 Other secondary scoliosis, lumbar region: Secondary | ICD-10-CM | POA: Diagnosis not present

## 2019-12-03 DIAGNOSIS — M5416 Radiculopathy, lumbar region: Secondary | ICD-10-CM | POA: Diagnosis not present

## 2019-12-03 DIAGNOSIS — Z7982 Long term (current) use of aspirin: Secondary | ICD-10-CM

## 2019-12-03 DIAGNOSIS — Z79890 Hormone replacement therapy (postmenopausal): Secondary | ICD-10-CM | POA: Diagnosis not present

## 2019-12-03 DIAGNOSIS — M5116 Intervertebral disc disorders with radiculopathy, lumbar region: Secondary | ICD-10-CM | POA: Diagnosis not present

## 2019-12-03 DIAGNOSIS — Z91041 Radiographic dye allergy status: Secondary | ICD-10-CM

## 2019-12-03 DIAGNOSIS — Z87891 Personal history of nicotine dependence: Secondary | ICD-10-CM | POA: Diagnosis not present

## 2019-12-03 DIAGNOSIS — Z79899 Other long term (current) drug therapy: Secondary | ICD-10-CM | POA: Diagnosis not present

## 2019-12-03 DIAGNOSIS — I2510 Atherosclerotic heart disease of native coronary artery without angina pectoris: Secondary | ICD-10-CM | POA: Diagnosis not present

## 2019-12-03 DIAGNOSIS — K219 Gastro-esophageal reflux disease without esophagitis: Secondary | ICD-10-CM | POA: Diagnosis present

## 2019-12-03 DIAGNOSIS — M4326 Fusion of spine, lumbar region: Secondary | ICD-10-CM | POA: Diagnosis not present

## 2019-12-03 DIAGNOSIS — Z8719 Personal history of other diseases of the digestive system: Secondary | ICD-10-CM

## 2019-12-03 DIAGNOSIS — J329 Chronic sinusitis, unspecified: Secondary | ICD-10-CM | POA: Diagnosis present

## 2019-12-03 DIAGNOSIS — R1314 Dysphagia, pharyngoesophageal phase: Secondary | ICD-10-CM | POA: Diagnosis present

## 2019-12-03 DIAGNOSIS — G8929 Other chronic pain: Secondary | ICD-10-CM | POA: Diagnosis present

## 2019-12-03 DIAGNOSIS — Z888 Allergy status to other drugs, medicaments and biological substances status: Secondary | ICD-10-CM | POA: Diagnosis not present

## 2019-12-03 DIAGNOSIS — K5900 Constipation, unspecified: Secondary | ICD-10-CM | POA: Diagnosis not present

## 2019-12-03 DIAGNOSIS — M4186 Other forms of scoliosis, lumbar region: Principal | ICD-10-CM | POA: Diagnosis present

## 2019-12-03 DIAGNOSIS — Z7951 Long term (current) use of inhaled steroids: Secondary | ICD-10-CM | POA: Diagnosis not present

## 2019-12-03 DIAGNOSIS — G4700 Insomnia, unspecified: Secondary | ICD-10-CM | POA: Diagnosis present

## 2019-12-03 DIAGNOSIS — M797 Fibromyalgia: Secondary | ICD-10-CM | POA: Diagnosis not present

## 2019-12-03 DIAGNOSIS — M48062 Spinal stenosis, lumbar region with neurogenic claudication: Secondary | ICD-10-CM | POA: Diagnosis present

## 2019-12-03 DIAGNOSIS — M4126 Other idiopathic scoliosis, lumbar region: Secondary | ICD-10-CM | POA: Diagnosis not present

## 2019-12-03 DIAGNOSIS — Z880 Allergy status to penicillin: Secondary | ICD-10-CM

## 2019-12-03 DIAGNOSIS — E039 Hypothyroidism, unspecified: Secondary | ICD-10-CM | POA: Diagnosis present

## 2019-12-03 DIAGNOSIS — Z419 Encounter for procedure for purposes other than remedying health state, unspecified: Secondary | ICD-10-CM

## 2019-12-03 DIAGNOSIS — I2583 Coronary atherosclerosis due to lipid rich plaque: Secondary | ICD-10-CM | POA: Diagnosis not present

## 2019-12-03 DIAGNOSIS — M80022A Age-related osteoporosis with current pathological fracture, left humerus, initial encounter for fracture: Secondary | ICD-10-CM | POA: Diagnosis not present

## 2019-12-03 DIAGNOSIS — M810 Age-related osteoporosis without current pathological fracture: Secondary | ICD-10-CM | POA: Diagnosis present

## 2019-12-03 DIAGNOSIS — M5136 Other intervertebral disc degeneration, lumbar region: Secondary | ICD-10-CM | POA: Diagnosis not present

## 2019-12-03 DIAGNOSIS — R935 Abnormal findings on diagnostic imaging of other abdominal regions, including retroperitoneum: Secondary | ICD-10-CM | POA: Diagnosis not present

## 2019-12-03 DIAGNOSIS — M4800 Spinal stenosis, site unspecified: Secondary | ICD-10-CM | POA: Diagnosis not present

## 2019-12-03 DIAGNOSIS — Z20822 Contact with and (suspected) exposure to covid-19: Secondary | ICD-10-CM | POA: Diagnosis present

## 2019-12-03 DIAGNOSIS — M4180 Other forms of scoliosis, site unspecified: Secondary | ICD-10-CM | POA: Diagnosis not present

## 2019-12-03 DIAGNOSIS — Z883 Allergy status to other anti-infective agents status: Secondary | ICD-10-CM

## 2019-12-03 DIAGNOSIS — M48061 Spinal stenosis, lumbar region without neurogenic claudication: Secondary | ICD-10-CM | POA: Diagnosis not present

## 2019-12-03 HISTORY — PX: ANTERIOR LAT LUMBAR FUSION: SHX1168

## 2019-12-03 LAB — ABO/RH: ABO/RH(D): A NEG

## 2019-12-03 SURGERY — ANTERIOR LATERAL LUMBAR FUSION 2 LEVELS
Anesthesia: General | Site: Spine Lumbar

## 2019-12-03 MED ORDER — LACTATED RINGERS IV SOLN: INTRAVENOUS | Status: DC

## 2019-12-03 MED ORDER — EPHEDRINE SULFATE-NACL 50-0.9 MG/10ML-% IV SOSY
PREFILLED_SYRINGE | INTRAVENOUS | Status: DC | PRN
  Administered 2019-12-03 (×2): 5 mg via INTRAVENOUS

## 2019-12-03 MED ORDER — FENTANYL CITRATE (PF) 100 MCG/2ML IJ SOLN
INTRAMUSCULAR | Status: AC
Start: 2019-12-03 — End: 2019-12-03
  Administered 2019-12-03: 50 ug via INTRAVENOUS
  Filled 2019-12-03: qty 2

## 2019-12-03 MED ORDER — PHENYLEPHRINE HCL-NACL 10-0.9 MG/250ML-% IV SOLN
INTRAVENOUS | Status: DC | PRN
  Administered 2019-12-03: 25 ug/min via INTRAVENOUS

## 2019-12-03 MED ORDER — ONDANSETRON HCL 4 MG/2ML IJ SOLN
INTRAMUSCULAR | Status: DC | PRN
  Administered 2019-12-03: 4 mg via INTRAVENOUS

## 2019-12-03 MED ORDER — 0.9 % SODIUM CHLORIDE (POUR BTL) OPTIME
TOPICAL | Status: DC | PRN
  Administered 2019-12-03 (×2): 1000 mL

## 2019-12-03 MED ORDER — SODIUM CHLORIDE 0.9% FLUSH: 3.0000 mL | INTRAVENOUS | Status: DC | PRN

## 2019-12-03 MED ORDER — SUCCINYLCHOLINE CHLORIDE 200 MG/10ML IV SOSY
PREFILLED_SYRINGE | INTRAVENOUS | Status: DC | PRN
  Administered 2019-12-03: 140 mg via INTRAVENOUS

## 2019-12-03 MED ORDER — FENTANYL CITRATE (PF) 100 MCG/2ML IJ SOLN
25.0000 ug | INTRAMUSCULAR | Status: DC | PRN
  Administered 2019-12-03 (×2): 50 ug via INTRAVENOUS

## 2019-12-03 MED ORDER — SODIUM CHLORIDE 0.9 % IV SOLN: 250.0000 mL | INTRAVENOUS | Status: DC

## 2019-12-03 MED ORDER — ACETAMINOPHEN 325 MG PO TABS
650.0000 mg | ORAL_TABLET | ORAL | Status: DC | PRN
  Administered 2019-12-03: 650 mg via ORAL
  Filled 2019-12-03 (×2): qty 2

## 2019-12-03 MED ORDER — ALPRAZOLAM 0.25 MG PO TABS: 0.2500 mg | ORAL_TABLET | Freq: Every evening | ORAL | Status: DC | PRN

## 2019-12-03 MED ORDER — PROPOFOL 500 MG/50ML IV EMUL
INTRAVENOUS | Status: DC | PRN
  Administered 2019-12-03: 50 ug/kg/min via INTRAVENOUS

## 2019-12-03 MED ORDER — METHOCARBAMOL 500 MG PO TABS
ORAL_TABLET | ORAL | Status: AC
  Filled 2019-12-03: qty 1

## 2019-12-03 MED ORDER — LIDOCAINE 2% (20 MG/ML) 5 ML SYRINGE
INTRAMUSCULAR | Status: DC | PRN
  Administered 2019-12-03: 40 mg via INTRAVENOUS

## 2019-12-03 MED ORDER — DEXAMETHASONE SODIUM PHOSPHATE 10 MG/ML IJ SOLN
INTRAMUSCULAR | Status: AC
  Filled 2019-12-03: qty 1

## 2019-12-03 MED ORDER — TRANEXAMIC ACID-NACL 1000-0.7 MG/100ML-% IV SOLN
INTRAVENOUS | Status: DC | PRN
  Administered 2019-12-03: 1000 mg via INTRAVENOUS

## 2019-12-03 MED ORDER — FENTANYL CITRATE (PF) 100 MCG/2ML IJ SOLN
INTRAMUSCULAR | Status: DC | PRN
Start: 2019-12-03 — End: 2019-12-03
  Administered 2019-12-03: 50 ug via INTRAVENOUS
  Administered 2019-12-03: 100 ug via INTRAVENOUS
  Administered 2019-12-03 (×3): 50 ug via INTRAVENOUS

## 2019-12-03 MED ORDER — PHENYLEPHRINE 40 MCG/ML (10ML) SYRINGE FOR IV PUSH (FOR BLOOD PRESSURE SUPPORT)
PREFILLED_SYRINGE | INTRAVENOUS | Status: DC | PRN
  Administered 2019-12-03: 80 ug via INTRAVENOUS
  Administered 2019-12-03: 120 ug via INTRAVENOUS

## 2019-12-03 MED ORDER — VANCOMYCIN HCL IN DEXTROSE 1-5 GM/200ML-% IV SOLN
1000.0000 mg | INTRAVENOUS | Status: AC
  Administered 2019-12-03: 1000 mg via INTRAVENOUS
  Filled 2019-12-03: qty 200

## 2019-12-03 MED ORDER — FENTANYL CITRATE (PF) 250 MCG/5ML IJ SOLN
INTRAMUSCULAR | Status: AC
  Filled 2019-12-03: qty 5

## 2019-12-03 MED ORDER — MORPHINE SULFATE (PF) 2 MG/ML IV SOLN
2.0000 mg | INTRAVENOUS | Status: DC | PRN
  Administered 2019-12-04: 2 mg via INTRAVENOUS
  Filled 2019-12-03: qty 1

## 2019-12-03 MED ORDER — BUPIVACAINE-EPINEPHRINE (PF) 0.25% -1:200000 IJ SOLN
INTRAMUSCULAR | Status: AC
  Filled 2019-12-03: qty 30

## 2019-12-03 MED ORDER — TRANEXAMIC ACID-NACL 1000-0.7 MG/100ML-% IV SOLN
INTRAVENOUS | Status: AC
  Filled 2019-12-03: qty 100

## 2019-12-03 MED ORDER — OXYCODONE HCL 5 MG PO TABS
10.0000 mg | ORAL_TABLET | ORAL | Status: DC | PRN
  Administered 2019-12-03 – 2019-12-04 (×5): 10 mg via ORAL
  Filled 2019-12-03 (×5): qty 2

## 2019-12-03 MED ORDER — PROPOFOL 10 MG/ML IV BOLUS
INTRAVENOUS | Status: DC | PRN
  Administered 2019-12-03: 140 mg via INTRAVENOUS

## 2019-12-03 MED ORDER — MENTHOL 3 MG MT LOZG: 1.0000 | LOZENGE | OROMUCOSAL | Status: DC | PRN

## 2019-12-03 MED ORDER — METHOCARBAMOL 500 MG PO TABS
500.0000 mg | ORAL_TABLET | Freq: Four times a day (QID) | ORAL | Status: DC | PRN
  Administered 2019-12-03 (×2): 500 mg via ORAL
  Filled 2019-12-03 (×3): qty 1

## 2019-12-03 MED ORDER — LIDOCAINE 2% (20 MG/ML) 5 ML SYRINGE
INTRAMUSCULAR | Status: AC
  Filled 2019-12-03: qty 5

## 2019-12-03 MED ORDER — DOCUSATE SODIUM 100 MG PO CAPS: 100.0000 mg | ORAL_CAPSULE | Freq: Two times a day (BID) | ORAL | Status: DC

## 2019-12-03 MED ORDER — THROMBIN 20000 UNITS EX SOLR
CUTANEOUS | Status: DC | PRN
  Administered 2019-12-03: 20 mL via TOPICAL

## 2019-12-03 MED ORDER — ORAL CARE MOUTH RINSE: 15.0000 mL | Freq: Once | OROMUCOSAL | Status: AC

## 2019-12-03 MED ORDER — METHOCARBAMOL 1000 MG/10ML IJ SOLN
500.0000 mg | Freq: Four times a day (QID) | INTRAVENOUS | Status: DC | PRN
  Filled 2019-12-03: qty 5

## 2019-12-03 MED ORDER — GABAPENTIN 300 MG PO CAPS
300.0000 mg | ORAL_CAPSULE | Freq: Three times a day (TID) | ORAL | Status: DC
  Administered 2019-12-03 (×2): 300 mg via ORAL
  Filled 2019-12-03 (×2): qty 1

## 2019-12-03 MED ORDER — FENTANYL CITRATE (PF) 100 MCG/2ML IJ SOLN
INTRAMUSCULAR | Status: AC
Start: 2019-12-03 — End: 2019-12-04
  Filled 2019-12-03: qty 2

## 2019-12-03 MED ORDER — OXYCODONE HCL 5 MG/5ML PO SOLN: 5.0000 mg | Freq: Once | ORAL | Status: AC | PRN

## 2019-12-03 MED ORDER — ATORVASTATIN CALCIUM 10 MG PO TABS
10.0000 mg | ORAL_TABLET | Freq: Every day | ORAL | Status: DC
  Administered 2019-12-03: 10 mg via ORAL
  Filled 2019-12-03: qty 1

## 2019-12-03 MED ORDER — SODIUM CHLORIDE 0.9% FLUSH
3.0000 mL | Freq: Two times a day (BID) | INTRAVENOUS | Status: DC
  Administered 2019-12-04: 3 mL via INTRAVENOUS

## 2019-12-03 MED ORDER — MIDAZOLAM HCL 5 MG/5ML IJ SOLN
INTRAMUSCULAR | Status: DC | PRN
  Administered 2019-12-03: 2 mg via INTRAVENOUS

## 2019-12-03 MED ORDER — ACETAMINOPHEN 650 MG RE SUPP: 650.0000 mg | RECTAL | Status: DC | PRN

## 2019-12-03 MED ORDER — ACETAMINOPHEN 10 MG/ML IV SOLN
INTRAVENOUS | Status: DC | PRN
  Administered 2019-12-03: 1000 mg via INTRAVENOUS

## 2019-12-03 MED ORDER — CHLORHEXIDINE GLUCONATE 0.12 % MT SOLN
15.0000 mL | Freq: Once | OROMUCOSAL | Status: AC
  Administered 2019-12-03: 15 mL via OROMUCOSAL
  Filled 2019-12-03: qty 15

## 2019-12-03 MED ORDER — OXYCODONE HCL 5 MG PO TABS: 5.0000 mg | ORAL_TABLET | Freq: Once | ORAL | Status: AC | PRN

## 2019-12-03 MED ORDER — MAGNESIUM CITRATE PO SOLN: 1.0000 | Freq: Once | ORAL | Status: DC | PRN

## 2019-12-03 MED ORDER — MIDAZOLAM HCL 2 MG/2ML IJ SOLN
INTRAMUSCULAR | Status: AC
  Filled 2019-12-03: qty 2

## 2019-12-03 MED ORDER — THROMBIN 20000 UNITS EX SOLR
CUTANEOUS | Status: AC
  Filled 2019-12-03: qty 20000

## 2019-12-03 MED ORDER — SUCCINYLCHOLINE CHLORIDE 200 MG/10ML IV SOSY
PREFILLED_SYRINGE | INTRAVENOUS | Status: AC
  Filled 2019-12-03: qty 10

## 2019-12-03 MED ORDER — EPHEDRINE 5 MG/ML INJ
INTRAVENOUS | Status: AC
  Filled 2019-12-03: qty 10

## 2019-12-03 MED ORDER — LEVOTHYROXINE SODIUM 88 MCG PO TABS
88.0000 ug | ORAL_TABLET | Freq: Every day | ORAL | Status: DC
  Administered 2019-12-04: 88 ug via ORAL
  Filled 2019-12-03: qty 1

## 2019-12-03 MED ORDER — DEXAMETHASONE SODIUM PHOSPHATE 10 MG/ML IJ SOLN
INTRAMUSCULAR | Status: DC | PRN
  Administered 2019-12-03: 10 mg via INTRAVENOUS

## 2019-12-03 MED ORDER — POLYETHYLENE GLYCOL 3350 17 G PO PACK: 17.0000 g | PACK | Freq: Every day | ORAL | Status: DC | PRN

## 2019-12-03 MED ORDER — ONDANSETRON HCL 4 MG/2ML IJ SOLN: 4.0000 mg | Freq: Once | INTRAMUSCULAR | Status: DC | PRN

## 2019-12-03 MED ORDER — OXYCODONE HCL 5 MG PO TABS
ORAL_TABLET | ORAL | Status: AC
  Administered 2019-12-03: 5 mg via ORAL
  Filled 2019-12-03: qty 1

## 2019-12-03 MED ORDER — ONDANSETRON HCL 4 MG/2ML IJ SOLN: 4.0000 mg | Freq: Four times a day (QID) | INTRAMUSCULAR | Status: DC | PRN

## 2019-12-03 MED ORDER — PROPOFOL 10 MG/ML IV BOLUS
INTRAVENOUS | Status: AC
  Filled 2019-12-03: qty 20

## 2019-12-03 MED ORDER — VANCOMYCIN HCL IN DEXTROSE 1-5 GM/200ML-% IV SOLN
1000.0000 mg | Freq: Once | INTRAVENOUS | Status: AC
  Administered 2019-12-03: 1000 mg via INTRAVENOUS
  Filled 2019-12-03: qty 200

## 2019-12-03 MED ORDER — BUPIVACAINE-EPINEPHRINE 0.25% -1:200000 IJ SOLN
INTRAMUSCULAR | Status: DC | PRN
  Administered 2019-12-03: 20 mL

## 2019-12-03 MED ORDER — ACETAMINOPHEN 10 MG/ML IV SOLN
INTRAVENOUS | Status: AC
  Filled 2019-12-03: qty 100

## 2019-12-03 MED ORDER — FLUTICASONE FUROATE-VILANTEROL 100-25 MCG/INH IN AEPB
1.0000 | INHALATION_SPRAY | Freq: Every day | RESPIRATORY_TRACT | Status: DC
  Filled 2019-12-03: qty 28

## 2019-12-03 MED ORDER — PHENYLEPHRINE 40 MCG/ML (10ML) SYRINGE FOR IV PUSH (FOR BLOOD PRESSURE SUPPORT)
PREFILLED_SYRINGE | INTRAVENOUS | Status: AC
  Filled 2019-12-03: qty 10

## 2019-12-03 MED ORDER — ONDANSETRON HCL 4 MG/2ML IJ SOLN
INTRAMUSCULAR | Status: AC
  Filled 2019-12-03: qty 2

## 2019-12-03 MED ORDER — ONDANSETRON HCL 4 MG PO TABS: 4.0000 mg | ORAL_TABLET | Freq: Four times a day (QID) | ORAL | Status: DC | PRN

## 2019-12-03 MED ORDER — OXYCODONE HCL 5 MG PO TABS
5.0000 mg | ORAL_TABLET | ORAL | Status: DC | PRN
  Administered 2019-12-04: 5 mg via ORAL

## 2019-12-03 MED ORDER — PHENOL 1.4 % MT LIQD: 1.0000 | OROMUCOSAL | Status: DC | PRN

## 2019-12-03 SURGICAL SUPPLY — 62 items
AGENT HMST KT MTR STRL THRMB (HEMOSTASIS) ×1
BLADE CLIPPER SURG (BLADE) IMPLANT
BLADE SURG 10 STRL SS (BLADE) ×2 IMPLANT
CAGE COROENT XL 12X22X45 (Cage) ×1 IMPLANT
CAGE COROENT XL WIDE 14X22X50M (Cage) ×1 IMPLANT
CATH FOLEY 2WAY SLVR 30CC 16FR (CATHETERS) ×2 IMPLANT
CLSR STERI-STRIP ANTIMIC 1/2X4 (GAUZE/BANDAGES/DRESSINGS) ×3 IMPLANT
COVER SURGICAL LIGHT HANDLE (MISCELLANEOUS) ×2 IMPLANT
COVER WAND RF STERILE (DRAPES) ×2 IMPLANT
DRAPE C-ARM 42X72 X-RAY (DRAPES) ×3 IMPLANT
DRAPE C-ARMOR (DRAPES) ×2 IMPLANT
DRAPE INCISE IOBAN 66X45 STRL (DRAPES) IMPLANT
DRAPE U-SHAPE 47X51 STRL (DRAPES) ×4 IMPLANT
DRSG OPSITE POSTOP 3X4 (GAUZE/BANDAGES/DRESSINGS) ×1 IMPLANT
DRSG OPSITE POSTOP 4X6 (GAUZE/BANDAGES/DRESSINGS) ×2 IMPLANT
DURAPREP 26ML APPLICATOR (WOUND CARE) ×2 IMPLANT
ELECT BLADE 4.0 EZ CLEAN MEGAD (MISCELLANEOUS) ×2
ELECT PENCIL ROCKER SW 15FT (MISCELLANEOUS) ×2 IMPLANT
ELECT REM PT RETURN 9FT ADLT (ELECTROSURGICAL) ×2
ELECTRODE BLDE 4.0 EZ CLN MEGD (MISCELLANEOUS) ×1 IMPLANT
ELECTRODE REM PT RTRN 9FT ADLT (ELECTROSURGICAL) ×1 IMPLANT
GLOVE BIO SURGEON STRL SZ 6.5 (GLOVE) ×2 IMPLANT
GLOVE BIOGEL PI IND STRL 6.5 (GLOVE) ×1 IMPLANT
GLOVE BIOGEL PI IND STRL 8.5 (GLOVE) ×1 IMPLANT
GLOVE BIOGEL PI INDICATOR 6.5 (GLOVE) ×1
GLOVE BIOGEL PI INDICATOR 8.5 (GLOVE) ×1
GLOVE SS BIOGEL STRL SZ 8.5 (GLOVE) ×1 IMPLANT
GLOVE SUPERSENSE BIOGEL SZ 8.5 (GLOVE) ×1
GOWN STRL REUS W/ TWL LRG LVL3 (GOWN DISPOSABLE) ×1 IMPLANT
GOWN STRL REUS W/ TWL XL LVL3 (GOWN DISPOSABLE) IMPLANT
GOWN STRL REUS W/TWL 2XL LVL3 (GOWN DISPOSABLE) ×4 IMPLANT
GOWN STRL REUS W/TWL LRG LVL3 (GOWN DISPOSABLE) ×2
GOWN STRL REUS W/TWL XL LVL3 (GOWN DISPOSABLE)
KIT BASIN OR (CUSTOM PROCEDURE TRAY) ×2 IMPLANT
KIT DILATOR XLIF 5 (KITS) IMPLANT
KIT SURGICAL ACCESS MAXCESS 4 (KITS) ×1 IMPLANT
KIT TURNOVER KIT B (KITS) ×2 IMPLANT
KIT XLIF (KITS) ×1
MIX DBX 10CC 35% BONE (Bone Implant) ×2 IMPLANT
MODULE EMG NDL SSEP NVM5 (NEEDLE) IMPLANT
MODULE EMG NEEDLE SSEP NVM5 (NEEDLE) ×2 IMPLANT
MODULE NVM5 NEXT GEN EMG (NEEDLE) ×1 IMPLANT
NEEDLE 22X1 1/2 (OR ONLY) (NEEDLE) ×2 IMPLANT
NEEDLE SPNL 18GX3.5 QUINCKE PK (NEEDLE) ×2 IMPLANT
NS IRRIG 1000ML POUR BTL (IV SOLUTION) ×2 IMPLANT
PACK LAMINECTOMY ORTHO (CUSTOM PROCEDURE TRAY) ×2 IMPLANT
PACK UNIVERSAL I (CUSTOM PROCEDURE TRAY) ×2 IMPLANT
PAD ARMBOARD 7.5X6 YLW CONV (MISCELLANEOUS) ×4 IMPLANT
SPONGE LAP 4X18 RFD (DISPOSABLE) ×2 IMPLANT
SPONGE SURGIFOAM ABS GEL 100 (HEMOSTASIS) IMPLANT
STAPLER VISISTAT 35W (STAPLE) ×2 IMPLANT
SURGIFLO W/THROMBIN 8M KIT (HEMOSTASIS) ×1 IMPLANT
SUT BONE WAX W31G (SUTURE) IMPLANT
SUT MNCRL AB 3-0 PS2 27 (SUTURE) ×2 IMPLANT
SUT VIC AB 1 CT1 18XCR BRD 8 (SUTURE) ×2 IMPLANT
SUT VIC AB 1 CT1 8-18 (SUTURE) ×4
SUT VIC AB 2-0 CT1 18 (SUTURE) ×3 IMPLANT
SYR BULB IRRIG 60ML STRL (SYRINGE) ×2 IMPLANT
TAPE CLOTH 4X10 WHT NS (GAUZE/BANDAGES/DRESSINGS) ×2 IMPLANT
TOWEL GREEN STERILE (TOWEL DISPOSABLE) ×2 IMPLANT
TOWEL GREEN STERILE FF (TOWEL DISPOSABLE) ×2 IMPLANT
WATER STERILE IRR 1000ML POUR (IV SOLUTION) IMPLANT

## 2019-12-03 NOTE — Anesthesia Procedure Notes (Signed)
Procedure Name: Intubation Date/Time: 12/03/2019 7:39 AM Performed by: Jenne Campus, CRNA Pre-anesthesia Checklist: Patient identified, Emergency Drugs available, Suction available and Patient being monitored Patient Re-evaluated:Patient Re-evaluated prior to induction Oxygen Delivery Method: Circle System Utilized Preoxygenation: Pre-oxygenation with 100% oxygen Induction Type: IV induction Ventilation: Mask ventilation without difficulty Laryngoscope Size: Miller and 2 Grade View: Grade I Tube type: Oral Tube size: 7.5 mm Number of attempts: 1 Airway Equipment and Method: Stylet and Oral airway Placement Confirmation: ETT inserted through vocal cords under direct vision,  positive ETCO2 and breath sounds checked- equal and bilateral Secured at: 21 cm Tube secured with: Tape Dental Injury: Teeth and Oropharynx as per pre-operative assessment

## 2019-12-03 NOTE — Brief Op Note (Signed)
12/03/2019  10:38 AM  PATIENT:  [name]  74 y.o. female  PRE-OPERATIVE DIAGNOSIS:  Degenerative scoliosis with spinal stenosis  POST-OPERATIVE DIAGNOSIS:  Degenerative scoliosis with spinal stenosis  PROCEDURE:  Procedure(s) with comments: ANTERIOR LATERAL LUMBAR FUSION L2-4 (N/A) - 4 hrs  SURGEON:  Surgeon(s) and Role:    Melina Schools, MD - Primary  PHYSICIAN ASSISTANT:   ASSISTANTS: Amanda Ward, PA   ANESTHESIA:   general  EBL:  75 mL   BLOOD ADMINISTERED:none  DRAINS: none   LOCAL MEDICATIONS USED:  MARCAINE     SPECIMEN:  No Specimen  DISPOSITION OF SPECIMEN:  N/A  COUNTS:  YES  TOURNIQUET:  * No tourniquets in log *  DICTATION: .Dragon Dictation  PLAN OF CARE: Admit to inpatient   PATIENT DISPOSITION:  PACU - hemodynamically stable.

## 2019-12-03 NOTE — H&P (Signed)
Addendum H&P.  There is been no change in her clinical exam since her last office visit of 11/18/2019.  She continues to have severe back buttock and neurogenic claudication pain.  Primary areas of concern L2-4.  Imaging studies confirm degenerative scoliosis with spinal stenosis L2-4.  As result of the failure of conservative management we elected to move forward with a two-level anterior posterior fusion.  Today will be phase 1 which is the lateral interbody fusion L2-3 and L3-4.  Provided there is improvement in her neurogenic claudication we will plan on posterior supplemental fixation tomorrow with pedicle screw fixation.  I have gone over the surgical procedure in great detail with the patient and all of her questions were encouraged and addressed.  Risks benefits and alternatives were also reviewed.

## 2019-12-03 NOTE — Op Note (Signed)
Operative report  Preoperative diagnosis: Degenerative lumbar disc disease with spinal stenosis and degenerative scoliosis L2-4.  Postoperative diagnosis: Same  Operative procedure: Lateral interbody fusion L2-5.  First Assistant: Cleta Alberts, PA  Complications: None  Implants: NuVasive peek intervertebral cage.  L2-3: 12 x 22 x 45 (0 degree lordosis).  L3-4: 14 x 22 x 50 (10 degree lordosis)  Allograft: DBX mix  Neuro monitoring: No abnormal free running EMGs or SSEP activity throughout the case.  Indications: [name] is a very pleasant 74 year old man with longstanding back buttock and bilateral neuropathic leg pain right side worse than the left.  Attempts at conservative management had failed to alleviate her symptoms and as result we elected to move forward with surgery.  All appropriate risks benefits and alternatives were discussed with the patient and consent was obtained.  Operative report: Patient is brought the operating room placed upon the operating room table.  After successful induction of general anesthesia and endotracheal ovation teds SCDs and a Foley were inserted.  Patient was turned in the lateral decubitus position on the surgical table and an axillary roll was placed.  All bony prominences were well-padded and the left arm was placed into the independent elevated arm holder.  Using fluoroscopy I made sure the patient was properly positioned so that the spinous process was in the midportion of the spine.  She was secured to the table with tape.  The table was then broken in the middle and she was placed in reverse Trendelenburg so he had parallel endplates on the lateral x-ray view.  At this point with the patient secured to the table had proper positioning in both the AP and lateral planes.  A timeout was taken to confirm patient procedure and all other important data.  The left flank was prepped and draped in a standard fashion for a lateral interbody approach.  Using  lateral fluoroscopy identified the anterior and posterior margins of the L3-4 disc space.  I then infiltrated this proposed incision site with quarter percent Marcaine with epinephrine.  A small area 1 fingerbreadth posteriorly was then marked and infiltrated with quarter percent Marcaine with epinephrine.  A lateral incision was made and I sharply dissected down to the fascia of the external oblique.  Once I was there hemostasis was obtained using bipolar electrocautery.  I then made my secondary incision posteriorly and placed my finger down to the posterior aspect of the retroperitoneal fascia.  I then advanced into the retroperitoneal and then used my finger to bluntly dissect.  Once I had mobilized the adipose tissue I then dissected through the aponeurosis of the internal oblique until I could see my finger in the lateral incision.  I then dissected through the remaining portion of the external oblique so that I could completely visualize my finger which was in the retroperitoneal space.  I then placed the first dilating probe down to the surface of the psoas at the L3-4 disc space level.  I confirmed position with fluoroscopy.  I then stimulated the surface of the psoas to ensure that I was not traumatizing the plexus.  Once this was confirmed I advanced the probe down to the lateral aspect of the disc space and held in place with a guidepin.  I then circumferentially stimulated to ensure that I was not traumatizing the plexus and then dilated.  I again stimulated circumferentially with each expanding dilator.  The final retractor was then placed and I remove the dilating tubes I then used lateral  fluoroscopy to position my retractor correctly.  I mobilized it from the midportion of the disc space back to the posterior third.  Once I was satisfied with its position I then stimulated posterior to the blades and anterior to the blades to ensure the plexus was not being traumatized.  In addition there is no  adverse activity on the free running EMGs or SSEPs.  I then secured the posterior blade into the disc with the shim, and then expanded the blades anteriorly so I had complete visualization of the lateral aspect of the L3-4 disc space.  I confirmed satisfactory position of my retractor in both planes.  Annulotomy was performed with a 10 blade scalpel and then I used a Cobb elevator to release the disc and the contralateral annulus.  I then used my box osteotome to remove the bulk of the disc material.  I then used curettes and pituitary rongeurs to completely remove the disc material and the cartilaginous endplate.  I completed my discectomy using cupped curettes and rasps.  Once all of the disc material was removed I then began using my trial intervertebral spacers.  At L3-4 I ultimately elected to use the size 14 lordotic intervertebral cage.  This provided the best overall fit and restoration of foraminal volume.  The implant was obtained and packed with a DBX mix graft.  After final irrigation I placed the cage using fluoroscopic guidance.  The retractors were ultimately removed and I made sure hemostasis using bipolar cautery and FloSeal.  X-rays at this point demonstrated slight endplate compromise at the anterior aspect of L2 but overall the cage was stable.  Provided excellent overall height restoration and stabilization of the level.  At this point using the same technique I used the L3-4 I placed my first dilating tube through the initial incision down to the L2-3 level.  Once I was properly positioned I secured it into the disc space with a guidepin and stimulated circumferentially to ensure no injury or trauma was occurring to the plexus.  I then sequentially dilated up and placed the retractor.  I then stimulated behind each of the blades to confirm satisfactory position.  Once the blade was properly positioned I secured it into the disc space with a shim and then advance the blades anteriorly so I  had excellent visualization of the lateral aspect of the disc space.  Once I confirmed with imaging that I had satisfactory position I performed an annulotomy with a 10 blade scalpel.  Using a Cobb elevator I remove the disc material as well as releasing the contralateral annulus.  Using a box osteotome as well as curettes and Kerrison rongeurs I removed all of the disc material.  I then scraped the remaining cartilaginous endplate off to expose the bleeding subchondral bone.  At this point with the discectomy complete I then used my trial implants.  The 12 parallel implant provided the best overall fit and restoration of intervertebral volume.  The peek intervertebral cage was obtained and packed with a DBX mix and then malleted to the appropriate depth.  The wound was copiously irrigated with normal saline and I remove the shim and used FloSeal to help maintain hemostasis.  The retractor was ultimately removed.  Final AP and lateral fluoroscopy views demonstrated satisfactory position of both intervertebral cages and restoration of lumbar lordosis and resolution of the scoliosis.  After final copious irrigation I closed the fascia of the external oblique with interrupted #1 Vicryl sutures.  The remainder  of the wound was closed in a layered fashion with interrupted 2-0 Vicryl sutures and a 3-0 Monocryl for the skin.  The posterior deep wound was also closed in a layered fashion with interrupted #1 Vicryl sutures, 2-0 Vicryl sutures, and 3-0 Monocryl for the skin.  Final intraoperative digital x-ray AP confirmed that there were no retained surgical implants in the wound.  At this point all needle and sponge counts were correct.  Steri-Strips and a dry dressing were applied and the patient was ultimately extubated and transferred to the PACU without incident.

## 2019-12-03 NOTE — Transfer of Care (Signed)
Immediate Anesthesia Transfer of Care Note  Patient: [name]  Procedure(s) Performed: ANTERIOR LATERAL LUMBAR FUSION L2-4 (N/A Spine Lumbar)  Patient Location: PACU  Anesthesia Type:General  Level of Consciousness: oriented, drowsy and patient cooperative  Airway & Oxygen Therapy: Patient Spontanous Breathing and Patient connected to nasal cannula oxygen  Post-op Assessment: Report given to RN and Post -op Vital signs reviewed and stable  Post vital signs: Reviewed  Last Vitals:  Vitals Value Taken Time  BP 128/99 12/03/19 1051  Temp    Pulse 76 12/03/19 1052  Resp 22 12/03/19 1052  SpO2 99 % 12/03/19 1052  Vitals shown include unvalidated device data.  Last Pain:  Vitals:   12/03/19 0644  TempSrc:   PainSc: 3          Complications: No complications documented.

## 2019-12-03 NOTE — OR Nursing (Signed)
Final X-Ray read and called back @ 1029 by Dr. Nyoka Cowden  shows no abnormalities.

## 2019-12-03 NOTE — Progress Notes (Signed)
Pharmacy Antibiotic Note  [name] is a 74 y.o. female admitted on 12/03/2019 s/p ANTERIOR LATERAL LUMBAR FUSION L2-4 (N/A).  Pharmacy has been consulted for vancomycin dosing for surgical prophylaxis. No drains noted.   Plan: Vancomycin 1000mg  IV x 1 at 1900 Pharmacy will sign off.   Height: 5\' 5"  (165.1 cm) Weight: 82.6 kg (182 lb) IBW/kg (Calculated) : 57  Temp (24hrs), Avg:98.1 F (36.7 C), Min:97.6 F (36.4 C), Max:98.5 F (36.9 C)  Recent Labs  Lab 12/01/19 0955  WBC 8.4  CREATININE 0.81    Estimated Creatinine Clearance: 65.6 mL/min (by C-G formula based on SCr of 0.81 mg/dL).    Allergies  Allergen Reactions   Alendronate Sodium Hives   Bactrim [Sulfamethoxazole-Trimethoprim]     Upset stomach   Contrast Media [Iodinated Diagnostic Agents] Hives   Iodine Hives   Milnacipran     REACTION: increased bp, increased hr   Penicillins Hives    Armando Lauman A. Levada Dy, PharmD, BCPS, FNKF Clinical Pharmacist [place] Please utilize Amion for appropriate phone number to reach the unit pharmacist ([place])   12/03/2019 1:52 PM

## 2019-12-04 ENCOUNTER — Inpatient Hospital Stay (HOSPITAL_COMMUNITY): Admission: RE | Admit: 2019-12-04 | Payer: Medicare HMO | Source: Home / Self Care | Admitting: Orthopedic Surgery

## 2019-12-04 ENCOUNTER — Encounter (HOSPITAL_COMMUNITY): Payer: Self-pay | Admitting: Orthopedic Surgery

## 2019-12-04 ENCOUNTER — Encounter (HOSPITAL_COMMUNITY)
Admission: RE | Disposition: A | Discharge status: Home / Self Care | Payer: Self-pay | Source: Home / Self Care | Attending: Orthopedic Surgery

## 2019-12-04 ENCOUNTER — Inpatient Hospital Stay (HOSPITAL_COMMUNITY): Payer: Medicare HMO

## 2019-12-04 ENCOUNTER — Inpatient Hospital Stay (HOSPITAL_COMMUNITY): Payer: Medicare HMO | Admitting: Anesthesiology

## 2019-12-04 LAB — CBC
HCT: 33.9 % — ABNORMAL LOW (ref 36.0–46.0)
Hemoglobin: 10.8 g/dL — ABNORMAL LOW (ref 12.0–15.0)
MCH: 30.2 pg (ref 26.0–34.0)
MCHC: 31.9 g/dL (ref 30.0–36.0)
MCV: 94.7 fL (ref 80.0–100.0)
Platelets: 218 10*3/uL (ref 150–400)
RBC: 3.58 MIL/uL — ABNORMAL LOW (ref 3.87–5.11)
RDW: 12.8 % (ref 11.5–15.5)
WBC: 15.1 10*3/uL — ABNORMAL HIGH (ref 4.0–10.5)
nRBC: 0 % (ref 0.0–0.2)

## 2019-12-04 LAB — PROTIME-INR
INR: 1.1 (ref 0.8–1.2)
Prothrombin Time: 13.6 seconds (ref 11.4–15.2)

## 2019-12-04 LAB — BASIC METABOLIC PANEL
Anion gap: 11 (ref 5–15)
BUN: 10 mg/dL (ref 8–23)
CO2: 24 mmol/L (ref 22–32)
Calcium: 8.3 mg/dL — ABNORMAL LOW (ref 8.9–10.3)
Chloride: 102 mmol/L (ref 98–111)
Creatinine, Ser: 0.78 mg/dL (ref 0.44–1.00)
GFR calc Af Amer: >60 mL/min (ref >60)
GFR calc non Af Amer: >60 mL/min (ref >60)
Glucose, Bld: 135 mg/dL — ABNORMAL HIGH (ref 70–99)
Potassium: 3.8 mmol/L (ref 3.5–5.1)
Sodium: 137 mmol/L (ref 135–145)

## 2019-12-04 SURGERY — POSTERIOR LUMBAR FUSION 2 LEVEL
Anesthesia: General | Site: Spine Lumbar

## 2019-12-04 MED ORDER — OXYCODONE HCL 5 MG PO TABS
10.0000 mg | ORAL_TABLET | ORAL | Status: DC | PRN
  Administered 2019-12-04 – 2019-12-06 (×7): 10 mg via ORAL
  Filled 2019-12-04 (×7): qty 2

## 2019-12-04 MED ORDER — PHENYLEPHRINE HCL (PRESSORS) 10 MG/ML IV SOLN
INTRAVENOUS | Status: DC | PRN
  Administered 2019-12-04 (×2): 80 ug via INTRAVENOUS

## 2019-12-04 MED ORDER — ONDANSETRON HCL 4 MG PO TABS: 4.0000 mg | ORAL_TABLET | Freq: Three times a day (TID) | ORAL | 0 refills | Status: DC | PRN

## 2019-12-04 MED ORDER — PROPOFOL 500 MG/50ML IV EMUL
INTRAVENOUS | Status: DC | PRN
  Administered 2019-12-04: 50 ug/kg/min via INTRAVENOUS

## 2019-12-04 MED ORDER — FENTANYL CITRATE (PF) 100 MCG/2ML IJ SOLN
INTRAMUSCULAR | Status: DC | PRN
Start: 2019-12-04 — End: 2019-12-04
  Administered 2019-12-04 (×2): 100 ug via INTRAVENOUS
  Administered 2019-12-04: 50 ug via INTRAVENOUS
  Administered 2019-12-04 (×2): 100 ug via INTRAVENOUS

## 2019-12-04 MED ORDER — OXYCODONE HCL 5 MG PO TABS: 5.0000 mg | ORAL_TABLET | Freq: Once | ORAL | Status: DC | PRN

## 2019-12-04 MED ORDER — SUCCINYLCHOLINE CHLORIDE 20 MG/ML IJ SOLN
INTRAMUSCULAR | Status: DC | PRN
  Administered 2019-12-04: 80 mg via INTRAVENOUS

## 2019-12-04 MED ORDER — PHENYLEPHRINE HCL-NACL 10-0.9 MG/250ML-% IV SOLN
INTRAVENOUS | Status: DC | PRN
  Administered 2019-12-04: 40 ug/min via INTRAVENOUS

## 2019-12-04 MED ORDER — 0.9 % SODIUM CHLORIDE (POUR BTL) OPTIME
TOPICAL | Status: DC | PRN
  Administered 2019-12-04: 1000 mL

## 2019-12-04 MED ORDER — PROPOFOL 10 MG/ML IV BOLUS
INTRAVENOUS | Status: DC | PRN
  Administered 2019-12-04: 150 mg via INTRAVENOUS

## 2019-12-04 MED ORDER — BUPIVACAINE LIPOSOME 1.3 % IJ SUSP
20.0000 mL | Freq: Once | INTRAMUSCULAR | Status: DC
  Filled 2019-12-04: qty 20

## 2019-12-04 MED ORDER — OXYCODONE-ACETAMINOPHEN 10-325 MG PO TABS: 1.0000 | ORAL_TABLET | Freq: Four times a day (QID) | ORAL | 0 refills | Status: DC | PRN

## 2019-12-04 MED ORDER — DEXAMETHASONE SODIUM PHOSPHATE 10 MG/ML IJ SOLN
INTRAMUSCULAR | Status: DC | PRN
  Administered 2019-12-04: 10 mg via INTRAVENOUS

## 2019-12-04 MED ORDER — METHOCARBAMOL 1000 MG/10ML IJ SOLN
500.0000 mg | Freq: Four times a day (QID) | INTRAVENOUS | Status: DC | PRN
  Filled 2019-12-04: qty 5

## 2019-12-04 MED ORDER — ONDANSETRON HCL 4 MG/2ML IJ SOLN: 4.0000 mg | Freq: Once | INTRAMUSCULAR | Status: DC | PRN

## 2019-12-04 MED ORDER — OXYCODONE HCL 5 MG/5ML PO SOLN: 5.0000 mg | Freq: Once | ORAL | Status: DC | PRN

## 2019-12-04 MED ORDER — BUPIVACAINE-EPINEPHRINE 0.25% -1:200000 IJ SOLN
INTRAMUSCULAR | Status: DC | PRN
  Administered 2019-12-04: 15 mL
  Administered 2019-12-04: 10 mL

## 2019-12-04 MED ORDER — FAMOTIDINE 20 MG PO TABS
20.0000 mg | ORAL_TABLET | Freq: Once | ORAL | Status: AC
  Administered 2019-12-04: 20 mg via ORAL
  Filled 2019-12-04: qty 1

## 2019-12-04 MED ORDER — METHOCARBAMOL 500 MG PO TABS
ORAL_TABLET | ORAL | Status: AC
  Filled 2019-12-04: qty 1

## 2019-12-04 MED ORDER — ONDANSETRON HCL 4 MG/2ML IJ SOLN
INTRAMUSCULAR | Status: DC | PRN
  Administered 2019-12-04: 4 mg via INTRAVENOUS

## 2019-12-04 MED ORDER — VANCOMYCIN HCL IN DEXTROSE 1-5 GM/200ML-% IV SOLN
1000.0000 mg | Freq: Once | INTRAVENOUS | Status: AC
  Administered 2019-12-04: 1000 mg via INTRAVENOUS
  Filled 2019-12-04: qty 200

## 2019-12-04 MED ORDER — HYDROMORPHONE HCL 1 MG/ML IJ SOLN
INTRAMUSCULAR | Status: AC
Start: 2019-12-04 — End: 2019-12-05
  Filled 2019-12-04: qty 1

## 2019-12-04 MED ORDER — ALBUTEROL SULFATE HFA 108 (90 BASE) MCG/ACT IN AERS
INHALATION_SPRAY | RESPIRATORY_TRACT | Status: DC | PRN
  Administered 2019-12-04: 4 via RESPIRATORY_TRACT

## 2019-12-04 MED ORDER — LIDOCAINE HCL (CARDIAC) PF 100 MG/5ML IV SOSY
PREFILLED_SYRINGE | INTRAVENOUS | Status: DC | PRN
  Administered 2019-12-04: 20 mg via INTRAVENOUS

## 2019-12-04 MED ORDER — OXYCODONE HCL 5 MG PO TABS
ORAL_TABLET | ORAL | Status: AC
Start: 2019-12-04 — End: 2019-12-05
  Filled 2019-12-04: qty 2

## 2019-12-04 MED ORDER — METHOCARBAMOL 500 MG PO TABS: 500.0000 mg | ORAL_TABLET | Freq: Three times a day (TID) | ORAL | 0 refills | Status: DC | PRN

## 2019-12-04 MED ORDER — CHLORHEXIDINE GLUCONATE 0.12 % MT SOLN
OROMUCOSAL | Status: AC
  Filled 2019-12-04: qty 15

## 2019-12-04 MED ORDER — SODIUM CHLORIDE 0.9% FLUSH: 3.0000 mL | Freq: Two times a day (BID) | INTRAVENOUS | Status: DC

## 2019-12-04 MED ORDER — ONDANSETRON HCL 4 MG PO TABS: 4.0000 mg | ORAL_TABLET | Freq: Four times a day (QID) | ORAL | Status: DC | PRN

## 2019-12-04 MED ORDER — OXYCODONE HCL 5 MG PO TABS
5.0000 mg | ORAL_TABLET | ORAL | Status: DC | PRN
  Administered 2019-12-04 – 2019-12-05 (×4): 5 mg via ORAL
  Filled 2019-12-04 (×4): qty 1

## 2019-12-04 MED ORDER — CHLORHEXIDINE GLUCONATE 0.12 % MT SOLN
15.0000 mL | Freq: Once | OROMUCOSAL | Status: AC
  Administered 2019-12-04: 15 mL via OROMUCOSAL
  Filled 2019-12-04: qty 15

## 2019-12-04 MED ORDER — POLYETHYLENE GLYCOL 3350 17 G PO PACK: 17.0000 g | PACK | Freq: Every day | ORAL | Status: DC | PRN

## 2019-12-04 MED ORDER — MORPHINE SULFATE (PF) 2 MG/ML IV SOLN
2.0000 mg | INTRAVENOUS | Status: AC | PRN
  Administered 2019-12-04 (×2): 2 mg via INTRAVENOUS
  Filled 2019-12-04 (×2): qty 1

## 2019-12-04 MED ORDER — CHLORHEXIDINE GLUCONATE CLOTH 2 % EX PADS
6.0000 | MEDICATED_PAD | Freq: Every day | CUTANEOUS | Status: DC
  Administered 2019-12-04: 6 via TOPICAL

## 2019-12-04 MED ORDER — EPHEDRINE SULFATE 50 MG/ML IJ SOLN
INTRAMUSCULAR | Status: DC | PRN
  Administered 2019-12-04: 5 mg via INTRAVENOUS

## 2019-12-04 MED ORDER — VANCOMYCIN HCL IN DEXTROSE 1-5 GM/200ML-% IV SOLN
1000.0000 mg | INTRAVENOUS | Status: AC
  Administered 2019-12-04: 1000 mg via INTRAVENOUS
  Filled 2019-12-04: qty 200

## 2019-12-04 MED ORDER — ACETAMINOPHEN 325 MG PO TABS
650.0000 mg | ORAL_TABLET | ORAL | Status: DC | PRN
  Administered 2019-12-05: 650 mg via ORAL
  Filled 2019-12-04: qty 2

## 2019-12-04 MED ORDER — LACTATED RINGERS IV SOLN: INTRAVENOUS | Status: DC

## 2019-12-04 MED ORDER — ACETAMINOPHEN 650 MG RE SUPP: 650.0000 mg | RECTAL | Status: DC | PRN

## 2019-12-04 MED ORDER — MAGNESIUM CITRATE PO SOLN
1.0000 | Freq: Once | ORAL | Status: AC | PRN
  Administered 2019-12-04: 1 via ORAL
  Filled 2019-12-04 (×2): qty 296

## 2019-12-04 MED ORDER — BUPIVACAINE LIPOSOME 1.3 % IJ SUSP
INTRAMUSCULAR | Status: DC | PRN
  Administered 2019-12-04: 15 mL

## 2019-12-04 MED ORDER — GABAPENTIN 300 MG PO CAPS
300.0000 mg | ORAL_CAPSULE | Freq: Three times a day (TID) | ORAL | Status: DC
  Administered 2019-12-04 – 2019-12-06 (×6): 300 mg via ORAL
  Filled 2019-12-04 (×6): qty 1

## 2019-12-04 MED ORDER — SODIUM CHLORIDE 0.9% FLUSH: 3.0000 mL | INTRAVENOUS | Status: DC | PRN

## 2019-12-04 MED ORDER — DOCUSATE SODIUM 100 MG PO CAPS
100.0000 mg | ORAL_CAPSULE | Freq: Two times a day (BID) | ORAL | Status: DC
  Administered 2019-12-04 – 2019-12-06 (×5): 100 mg via ORAL
  Filled 2019-12-04 (×5): qty 1

## 2019-12-04 MED ORDER — ONDANSETRON HCL 4 MG/2ML IJ SOLN: 4.0000 mg | Freq: Four times a day (QID) | INTRAMUSCULAR | Status: DC | PRN

## 2019-12-04 MED ORDER — METHOCARBAMOL 500 MG PO TABS
500.0000 mg | ORAL_TABLET | Freq: Four times a day (QID) | ORAL | Status: DC | PRN
  Administered 2019-12-04 – 2019-12-06 (×6): 500 mg via ORAL
  Filled 2019-12-04 (×5): qty 1

## 2019-12-04 MED ORDER — HYDROMORPHONE HCL 1 MG/ML IJ SOLN
0.2500 mg | INTRAMUSCULAR | Status: DC | PRN
  Administered 2019-12-04: 1 mg via INTRAVENOUS

## 2019-12-04 MED ORDER — SODIUM CHLORIDE 0.9 % IV SOLN: 250.0000 mL | INTRAVENOUS | Status: DC

## 2019-12-04 SURGICAL SUPPLY — 75 items
AGENT HMST KT MTR STRL THRMB (HEMOSTASIS) ×1
APL PRP STRL LF DISP 70% ISPRP (MISCELLANEOUS) ×1
BLADE CLIPPER SURG (BLADE) IMPLANT
BUR EGG ELITE 4.0 (BURR) IMPLANT
CABLE BIPOLOR RESECTION CORD (MISCELLANEOUS) ×2 IMPLANT
CHLORAPREP W/TINT 26 (MISCELLANEOUS) ×1 IMPLANT
CLIP NEUROVISION LG (CLIP) ×2 IMPLANT
CLSR STERI-STRIP ANTIMIC 1/2X4 (GAUZE/BANDAGES/DRESSINGS) ×3 IMPLANT
COVER MAYO STAND STRL (DRAPES) IMPLANT
COVER SURGICAL LIGHT HANDLE (MISCELLANEOUS) ×2 IMPLANT
COVER WAND RF STERILE (DRAPES) ×2 IMPLANT
DRAPE C-ARM 42X72 X-RAY (DRAPES) ×2 IMPLANT
DRAPE C-ARMOR (DRAPES) ×2 IMPLANT
DRAPE POUCH INSTRU U-SHP 10X18 (DRAPES) ×2 IMPLANT
DRAPE SURG 17X23 STRL (DRAPES) ×2 IMPLANT
DRAPE U-SHAPE 47X51 STRL (DRAPES) ×2 IMPLANT
DRSG OPSITE POSTOP 4X6 (GAUZE/BANDAGES/DRESSINGS) ×4 IMPLANT
DRSG OPSITE POSTOP 4X8 (GAUZE/BANDAGES/DRESSINGS) ×2 IMPLANT
DURAPREP 26ML APPLICATOR (WOUND CARE) ×1 IMPLANT
ELECT BLADE 4.0 EZ CLEAN MEGAD (MISCELLANEOUS)
ELECT BLADE 6.5 EXT (BLADE) ×1 IMPLANT
ELECT CAUTERY BLADE 6.4 (BLADE) ×2 IMPLANT
ELECT PENCIL ROCKER SW 15FT (MISCELLANEOUS) ×2 IMPLANT
ELECT REM PT RETURN 9FT ADLT (ELECTROSURGICAL) ×2
ELECTRODE BLDE 4.0 EZ CLN MEGD (MISCELLANEOUS) IMPLANT
ELECTRODE REM PT RTRN 9FT ADLT (ELECTROSURGICAL) ×1 IMPLANT
GLOVE BIOGEL PI IND STRL 8.5 (GLOVE) ×1 IMPLANT
GLOVE BIOGEL PI INDICATOR 8.5 (GLOVE) ×1
GLOVE SS BIOGEL STRL SZ 8.5 (GLOVE) ×1 IMPLANT
GLOVE SUPERSENSE BIOGEL SZ 8.5 (GLOVE) ×1
GOWN STRL REUS W/ TWL LRG LVL3 (GOWN DISPOSABLE) ×1 IMPLANT
GOWN STRL REUS W/TWL 2XL LVL3 (GOWN DISPOSABLE) ×4 IMPLANT
GOWN STRL REUS W/TWL LRG LVL3 (GOWN DISPOSABLE) ×2
GUIDEWIRE NITINOL BEVEL TIP (WIRE) ×6 IMPLANT
KIT BASIN OR (CUSTOM PROCEDURE TRAY) ×2 IMPLANT
KIT POSITION SURG JACKSON T1 (MISCELLANEOUS) ×2 IMPLANT
KIT TURNOVER KIT B (KITS) ×2 IMPLANT
MODULE EMG NDL SSEP NVM5 (NEEDLE) IMPLANT
MODULE EMG NEEDLE SSEP NVM5 (NEEDLE) ×2 IMPLANT
MODULE NVM5 NEXT GEN EMG (NEEDLE) ×2 IMPLANT
NDL SPNL 18GX3.5 QUINCKE PK (NEEDLE) ×1 IMPLANT
NDL SPNL 22GX3.5 QUINCKE BK (NEEDLE) IMPLANT
NEEDLE 22X1 1/2 (OR ONLY) (NEEDLE) ×2 IMPLANT
NEEDLE SPNL 18GX3.5 QUINCKE PK (NEEDLE) ×2 IMPLANT
NEEDLE SPNL 22GX3.5 QUINCKE BK (NEEDLE) ×2 IMPLANT
NS IRRIG 1000ML POUR BTL (IV SOLUTION) ×2 IMPLANT
PACK LAMINECTOMY ORTHO (CUSTOM PROCEDURE TRAY) ×2 IMPLANT
PACK UNIVERSAL I (CUSTOM PROCEDURE TRAY) ×2 IMPLANT
PAD ARMBOARD 7.5X6 YLW CONV (MISCELLANEOUS) ×4 IMPLANT
PATTIES SURGICAL .5 X.5 (GAUZE/BANDAGES/DRESSINGS) IMPLANT
PATTIES SURGICAL .5 X1 (DISPOSABLE) ×1 IMPLANT
POSITIONER HEAD PRONE TRACH (MISCELLANEOUS) ×2 IMPLANT
PROBE BALL TIP NVM5 SNG USE (BALLOONS) ×1 IMPLANT
ROD RELINE MAS LORD 5.5X75MM (Rod) ×2 IMPLANT
ROD RELINE TI MAS 5.5X70MM LD (Rod) ×1 IMPLANT
SCREW LOCK RELINE 5.5 TULIP (Screw) ×6 IMPLANT
SCREW RELINE MAS POLY 6.5X40MM (Screw) ×4 IMPLANT
SCREW SPINAL REDUCE 5.5X40 C2 (Screw) ×2 IMPLANT
SPONGE LAP 4X18 RFD (DISPOSABLE) ×4 IMPLANT
SPONGE SURGIFOAM ABS GEL 100 (HEMOSTASIS) ×2 IMPLANT
SURGIFLO W/THROMBIN 8M KIT (HEMOSTASIS) ×1 IMPLANT
SUT BONE WAX W31G (SUTURE) ×2 IMPLANT
SUT MNCRL AB 3-0 PS2 18 (SUTURE) ×4 IMPLANT
SUT VIC AB 1 CT1 18XCR BRD 8 (SUTURE) ×1 IMPLANT
SUT VIC AB 1 CT1 8-18 (SUTURE) ×2
SUT VIC AB 1 CTX 36 (SUTURE)
SUT VIC AB 1 CTX36XBRD ANBCTR (SUTURE) IMPLANT
SUT VIC AB 2-0 CT1 18 (SUTURE) ×3 IMPLANT
SYR BULB IRRIG 60ML STRL (SYRINGE) ×2 IMPLANT
SYR CONTROL 10ML LL (SYRINGE) ×2 IMPLANT
TOWEL GREEN STERILE (TOWEL DISPOSABLE) ×2 IMPLANT
TOWEL GREEN STERILE FF (TOWEL DISPOSABLE) ×2 IMPLANT
TRAY FOLEY MTR SLVR 16FR STAT (SET/KITS/TRAYS/PACK) IMPLANT
WATER STERILE IRR 1000ML POUR (IV SOLUTION) ×2 IMPLANT
YANKAUER SUCT BULB TIP NO VENT (SUCTIONS) ×2 IMPLANT

## 2019-12-04 NOTE — Progress Notes (Signed)
Pharmacy Antibiotic Note  [name] is a 74 y.o. female admitted on 12/03/2019 s/p ANTERIOR LATERAL LUMBAR FUSION L2-4 (N/A).  Pharmacy has been consulted for vancomycin dosing for surgical prophylaxis. No drains noted.  Plan: Post-op vancomycin 1000 mg IV x 1 dose Pharmacy will sign off   Height: 5\' 5"  (165.1 cm) Weight: 82.6 kg (182 lb) IBW/kg (Calculated) : 57  Temp (24hrs), Avg:98.1 F (36.7 C), Min:97.6 F (36.4 C), Max:98.4 F (36.9 C)  Recent Labs  Lab 12/01/19 0955 12/04/19 0559  WBC 8.4 15.1*  CREATININE 0.81 0.78    Estimated Creatinine Clearance: 66.4 mL/min (by C-G formula based on SCr of 0.78 mg/dL).    Allergies  Allergen Reactions   Alendronate Sodium Hives   Bactrim [Sulfamethoxazole-Trimethoprim]     Upset stomach   Contrast Media [Iodinated Diagnostic Agents] Hives   Iodine Hives   Milnacipran     REACTION: increased bp, increased hr   Penicillins Hives     Thank you for allowing [place] to participate in this patients care.   Jens Som, PharmD Please see amion for complete clinical pharmacist phone list. 12/04/2019 3:49 PM

## 2019-12-04 NOTE — Anesthesia Postprocedure Evaluation (Signed)
Anesthesia Post Note  Patient: [name]  Procedure(s) Performed: ANTERIOR LATERAL LUMBAR FUSION L2-4 (N/A Spine Lumbar)     Patient location during evaluation: PACU Anesthesia Type: General Level of consciousness: awake and alert Pain management: pain level controlled Vital Signs Assessment: post-procedure vital signs reviewed and stable Respiratory status: spontaneous breathing, nonlabored ventilation, respiratory function stable and patient connected to nasal cannula oxygen Cardiovascular status: blood pressure returned to baseline and stable Postop Assessment: no apparent nausea or vomiting Anesthetic complications: no   No complications documented.  Last Vitals:  Vitals:   12/04/19 1946 12/04/19 2240  BP: 118/70 119/66  Pulse: (!) 109 88  Resp: 18 18  Temp: 36.8 C 37.5 C  SpO2: 96% 98%    Last Pain:  Vitals:   12/04/19 2250  TempSrc:   PainSc: 3                  Marshea Wisher COKER

## 2019-12-04 NOTE — Brief Op Note (Signed)
12/03/2019 - 12/04/2019  1:59 PM  PATIENT:  [name]  74 y.o. female  PRE-OPERATIVE DIAGNOSIS:  Degenerative scoliosis with spinal stenosis  POST-OPERATIVE DIAGNOSIS:  Degenerative scoliosis with spinal stenosis  PROCEDURE:  Procedure(s) with comments: POSTERIOR LUMBAR FUSION L2-L4 (N/A) - 4 hrs  SURGEON:  Surgeon(s) and Role:    Melina Schools, MD - Primary  PHYSICIAN ASSISTANT:   ASSISTANTS: Amanda Ward, PA   ANESTHESIA:   general  EBL:  50 mL   BLOOD ADMINISTERED:none  DRAINS: none   LOCAL MEDICATIONS USED:  MARCAINE    and OTHER exparel  SPECIMEN:  No Specimen  DISPOSITION OF SPECIMEN:  N/A  COUNTS:  YES  TOURNIQUET:  * No tourniquets in log *  DICTATION: .Dragon Dictation  PLAN OF CARE: Admit to inpatient   PATIENT DISPOSITION:  PACU - hemodynamically stable.

## 2019-12-04 NOTE — Progress Notes (Signed)
    Subjective: Procedure(s) (LRB): POSTERIOR LUMBAR FUSION L2-4, POSSIBLE POSTERIOR DECOMPRESSION L2-5 (N/A) Day of Surgery  Patient reports pain as 3 on 0-10 scale.  Reports decreased leg pain reports incisional back pain   N/A void Negative bowel movement Negative flatus Negative chest pain or shortness of breath  Objective: Vital signs in last 24 hours: Temp:  [97.6 F (36.4 C)-98.4 F (36.9 C)] 97.9 F (36.6 C) (08/26 0801) Pulse Rate:  [66-97] 80 (08/26 0801) Resp:  [6-18] 18 (08/26 0801) BP: (99-154)/(57-85) 102/57 (08/26 0801) SpO2:  [96 %-100 %] 97 % (08/26 0801)  Intake/Output from previous day: 08/25 0701 - 08/26 0700 In: 1440 [P.O.:240; I.V.:1000; IV Piggyback:200] Out: 2500 [Urine:3100; Blood:75]  Labs: Recent Labs    12/04/19 0559  WBC 15.1*  RBC 3.58*  HCT 33.9*  PLT 218   Recent Labs    12/04/19 0559  NA 137  K 3.8  CL 102  CO2 24  BUN 10  CREATININE 0.78  GLUCOSE 135*  CALCIUM 8.3*   Recent Labs    12/04/19 0559  INR 1.1    Physical Exam: Neurologically intact ABD soft Neurovascular intact Dorsiflexion/Plantar flexion intact Incision: dressing C/D/I No cellulitis present Body mass index is 30.29 kg/m.   Assessment/Plan: Patient stable  xrays n/a Continue mobilization with physical therapy Continue care  Doing well overall. Neuropathic leg pain improved Plan on moving forward with PSFI L2-4. Since the radicular leg pain has resolved I do not think there is a need for decompression.    Melina Schools, MD [facility] [phone]

## 2019-12-04 NOTE — Anesthesia Procedure Notes (Signed)
Procedure Name: Intubation Date/Time: 12/04/2019 12:19 PM Performed by: Juandaniel Manfredo T, CRNA Pre-anesthesia Checklist: Patient identified, Emergency Drugs available, Suction available and Patient being monitored Patient Re-evaluated:Patient Re-evaluated prior to induction Oxygen Delivery Method: Circle system utilized Preoxygenation: Pre-oxygenation with 100% oxygen Induction Type: IV induction Ventilation: Mask ventilation without difficulty Laryngoscope Size: 3 Grade View: Grade II Tube type: Oral Tube size: 7.5 mm Number of attempts: 1 Airway Equipment and Method: Stylet and Oral airway Placement Confirmation: ETT inserted through vocal cords under direct vision,  positive ETCO2 and breath sounds checked- equal and bilateral Secured at: 22 cm Tube secured with: Tape Dental Injury: Teeth and Oropharynx as per pre-operative assessment

## 2019-12-04 NOTE — OR Nursing (Signed)
Left Flank dressing changed by MD in operating room. Site is dry and intact.

## 2019-12-04 NOTE — Progress Notes (Signed)
Patient is transferred to OR at this time. Alert and inb stable condition. Report given to receiving nurse Laveda Abbe, RN with all questions answered. Left unit via bed with husband at side.

## 2019-12-04 NOTE — Anesthesia Preprocedure Evaluation (Addendum)
Anesthesia Evaluation  Patient identified by MRN, date of birth, ID band Patient awake    Reviewed: Allergy & Precautions, NPO status , Patient's Chart, lab work & pertinent test results  Airway Mallampati: III  TM Distance: >3 FB Neck ROM: Full    Dental no notable dental hx. (+) Teeth Intact, Dental Advisory Given   Pulmonary COPD (took advair this morning ),  COPD inhaler, former smoker,  39 pack year history, quit 2005   Pulmonary exam normal breath sounds clear to auscultation       Cardiovascular + CAD  Normal cardiovascular exam Rhythm:Regular Rate:Normal     Neuro/Psych  Headaches, negative psych ROS   GI/Hepatic GERD  Medicated and Controlled,  Endo/Other  Hypothyroidism Obesity BMI 30  Renal/GU   negative genitourinary   Musculoskeletal  (+) Fibromyalgia -, narcotic dependentHas been on hydrocodone for 7 years    Abdominal Normal abdominal exam  (+)   Peds  Hematology hct 33.9, plt 218   Anesthesia Other Findings   Reproductive/Obstetrics negative OB ROS                                                             Anesthesia Evaluation  Patient identified by MRN, date of birth, ID band Patient awake    Reviewed: Allergy & Precautions, NPO status , Patient's Chart, lab work & pertinent test results  Airway Mallampati: II  TM Distance: >3 FB Neck ROM: Full    Dental  (+) Teeth Intact, Dental Advisory Given   Pulmonary former smoker,    breath sounds clear to auscultation       Cardiovascular  Rhythm:Regular Rate:Normal     Neuro/Psych    GI/Hepatic   Endo/Other    Renal/GU      Musculoskeletal   Abdominal   Peds  Hematology   Anesthesia Other Findings   Reproductive/Obstetrics                            Anesthesia Physical Anesthesia Plan  ASA: II  Anesthesia Plan: General   Post-op Pain Management:     Induction: Intravenous  PONV Risk Score and Plan: Ondansetron and Dexamethasone  Airway Management Planned: Oral ETT  Additional Equipment:   Intra-op Plan:   Post-operative Plan: Extubation in OR  Informed Consent: I have reviewed the patients History and Physical, chart, labs and discussed the procedure including the risks, benefits and alternatives for the proposed anesthesia with the patient or authorized representative who has indicated his/her understanding and acceptance.     Dental advisory given  Plan Discussed with: CRNA and Anesthesiologist  Anesthesia Plan Comments: (PAT note written 12/02/2019 by Myra Gianotti, PA-C. )       Anesthesia Quick Evaluation  Anesthesia Physical Anesthesia Plan  ASA: III  Anesthesia Plan: General   Post-op Pain Management:    Induction: Intravenous  PONV Risk Score and Plan: Ondansetron, Dexamethasone and Treatment may vary due to age or medical condition  Airway Management Planned: Oral ETT  Additional Equipment: None  Intra-op Plan:   Post-operative Plan: Extubation in OR  Informed Consent: I have reviewed the patients History and Physical, chart, labs and discussed the procedure including the risks, benefits and alternatives for the proposed anesthesia with the patient or  authorized representative who has indicated his/her understanding and acceptance.     Dental advisory given  Plan Discussed with: CRNA  Anesthesia Plan Comments:         Anesthesia Quick Evaluation

## 2019-12-04 NOTE — Transfer of Care (Signed)
Immediate Anesthesia Transfer of Care Note  Patient: [name]  Procedure(s) Performed: POSTERIOR LUMBAR FUSION L2-L4 (N/A Spine Lumbar)  Patient Location: PACU  Anesthesia Type:General  Level of Consciousness: awake, alert  and oriented  Airway & Oxygen Therapy: Patient Spontanous Breathing and Patient connected to face mask oxygen  Post-op Assessment: Report given to RN, Post -op Vital signs reviewed and stable and Patient moving all extremities  Post vital signs: Reviewed and stable  Last Vitals:  Vitals Value Taken Time  BP 148/87 12/04/19 1446  Temp    Pulse 99 12/04/19 1450  Resp 15 12/04/19 1450  SpO2 100 % 12/04/19 1450  Vitals shown include unvalidated device data.  Last Pain:  Vitals:   12/04/19 0907  TempSrc:   PainSc: 5       Patients Stated Pain Goal: 3 (48/62/82 4175)  Complications: No complications documented.

## 2019-12-04 NOTE — Op Note (Signed)
Operative report  Preoperative diagnosis degenerative scoliosis with spinal stenosis with neurogenic claudication.  Right radicular leg pain greater than left  Postoperative diagnosis: Same  Operative procedure: Supplemental posterior pedicle screw fixation L2-4.  Patient status post lateral interbody fusion L2-4.  Complications: None  First Assistant: Cleta Alberts, PA  EBL:50  Implants: NuVasive MIS pedicle screws.  L2: 5.5 x 40 mm length bilaterally.  L3 and L4: 6.5 x 40 mm length bilaterally.  Neuro monitoring: All pedicle screws were directly stimulated and there was no adverse activity at greater than 40 mA.  No abnormal SSEP or EMG activity throughout the case.  Indications: [name] is a very pleasant 74 year old man who has significant back buttock and radicular leg pain right worse than left.  Despite appropriate conservative management her quality of life is continued to deteriorate.  As result of the failure of conservative management we elected to move forward with surgery.  Patient presents today for the planned second stage spinal fusion L2-4.  All appropriate risks benefits and alternatives were discussed with the patient and consent was obtained.  Operative report: Patient was brought the operating placed on the operating room table.  After successful induction of general anesthesia endotracheal intubation teds SCDs were applied.  Patient was turned prone onto the [facility] spine frame and all bony prominences were well-padded.  The back was prepped and draped in a standard fashion.  Timeout was taken to confirm patient procedure and all other important data.  Using fluoroscopy identified the lateral borders of the L2, L3, L4 pedicles.  They were marked out on the skin surface.  I infiltrated this area with quarter percent Marcaine with epinephrine.  Small stab incisions were made and the Jamshidi needle was advanced percutaneously to the lateral border of the pedicle.  This  corresponded with the junction of transverse process and facet.  Once I was properly positioned I advanced the Jamshidi needle using fluoroscopic guidance and real-time neuro monitoring.  The Jamshidi trajectory was confirmed in the AP plane.  As I near the medial wall I switched to the lateral view to confirm that I was just beyond the posterior wall of the vertebral body.  Once I confirmed trajectory I advanced into the vertebral body.  A guidepin was then advanced through the Jamshidi needle to cannulate the pedicle.  This exact same technique was utilized to cannulate the remaining 5 pedicles.  Once all 6 pedicles were cannulated I then measured and placed the appropriate size screw percutaneously I placed a 5.5 diameter screw at L2 since this pedicle was slightly narrower.  All pedicle screws had excellent purchase and were well seated.  I then directly stimulated each of the screws and there was no adverse activity at greater than 40 mA.  I then measured and placed the appropriate size rod.  Once I confirm the rod was within each of the pedicle screws I placed the locking cap.  The locking caps were then torqued and tightened according manufacture standards.  X-rays were taken which demonstrated satisfactory positioning of the pedicle screws and rods in both the AP and lateral planes.  There is no migration or change in positioning of the lateral intervertebral devices.  At this point the insertion pegs were removed and the wounds were irrigated copiously with saline.  I then placed Marcaine with Exparel into the paraspinal muscles in order to aid in postoperative analgesia.  The deep fascia was closed with interrupted #1 Vicryl suture in a layer 2-0 Vicryl suture  and 3-0 Monocryl for the skin.  Steri-Strips and dry dressings were applied and the patient was ultimately extubated transfer the PACU without incident.  The end of the case all needle sponge counts were correct; there were no adverse  intraoperative events.

## 2019-12-04 NOTE — Anesthesia Postprocedure Evaluation (Signed)
Anesthesia Post Note  Patient: [name]  Procedure(s) Performed: POSTERIOR LUMBAR FUSION L2-L4 (N/A Spine Lumbar)     Patient location during evaluation: PACU Anesthesia Type: General Level of consciousness: awake and alert, oriented and patient cooperative Pain management: pain level controlled Vital Signs Assessment: post-procedure vital signs reviewed and stable Respiratory status: spontaneous breathing, nonlabored ventilation and respiratory function stable Cardiovascular status: blood pressure returned to baseline and stable Postop Assessment: no apparent nausea or vomiting Anesthetic complications: no   No complications documented.  Last Vitals:  Vitals:   12/04/19 0801 12/04/19 1445  BP: (!) 102/57 (!) 148/87  Pulse: 80 (!) 102  Resp: 18 12  Temp: 36.6 C 36.7 C  SpO2: 97% 100%    Last Pain:  Vitals:   12/04/19 0907  TempSrc:   PainSc: [name]

## 2019-12-04 NOTE — Discharge Instructions (Signed)

## 2019-12-05 NOTE — Progress Notes (Signed)
    Subjective: Procedure(s) (LRB): POSTERIOR LUMBAR FUSION L2-L4 (N/A) 1 Day Post-Op  Patient reports pain as 2 on 0-10 scale.  Reports decreased leg pain reports incisional back pain   Negative void - foley just removed Negative bowel movement Positive flatus Negative chest pain or shortness of breath  Objective: Vital signs in last 24 hours: Temp:  [97.9 F (36.6 C)-99.5 F (37.5 C)] 98.8 F (37.1 C) (08/27 0512) Pulse Rate:  [80-109] 84 (08/27 0512) Resp:  [12-18] 18 (08/27 0512) BP: (102-148)/(57-87) 107/62 (08/27 0512) SpO2:  [96 %-100 %] 96 % (08/27 0512)  Intake/Output from previous day: 08/26 0701 - 08/27 0700 In: 1000 [I.V.:1000] Out: 1050 [Urine:1000; Blood:50]  Labs: Recent Labs    12/04/19 0559  WBC 15.1*  RBC 3.58*  HCT 33.9*  PLT 218   Recent Labs    12/04/19 0559  NA 137  K 3.8  CL 102  CO2 24  BUN 10  CREATININE 0.78  GLUCOSE 135*  CALCIUM 8.3*   Recent Labs    12/04/19 0559  INR 1.1    Physical Exam: Neurologically intact ABD soft Intact pulses distally Incision: dressing C/D/I and no drainage Compartment soft Body mass index is 30.29 kg/m.   Assessment/Plan: Patient stable  xrays n/a Continue mobilization with physical therapy Continue care  Advance diet Up with therapy  1.  We will check a post void residual to ensure that she is completely emptying the bladder. 2.  Patient has been ambulating the length of the hallway doing quite well. 3.  Mag citrate to aid in constipation. 4.  Plan on discharge tomorrow as long as she has positive bowel function and normal bladder function and remains ambulatory.  Will order 3n1 for her.  Melina Schools, MD [facility] [phone]

## 2019-12-05 NOTE — Evaluation (Signed)
Occupational Therapy Evaluation Patient Details Name: [name] MRN: [id number] DOB: [birth year] Today's Date: 12/05/2019    History of Present Illness Pt is a 74 year old woman admitted for 2 step back surgery, ALIF 8/25 and PLIF 8/26 L 2-4.  PMH: COPD, CAD, HAs, fibromyalgia, hypothyroidism.    Clinical Impression   Pt was independent prior to admission. Presents with back pain, generalized weakness and impaired standing balance. She requires up to min assist for all mobility and set up to max assist for ADL. Began education in back precautions and compensatory strategies. Will follow acutely to reinforce and to educate in use of AE for LB ADL. Pt to return home with supportive husband.     Follow Up Recommendations  No OT follow up    Equipment Recommendations  3 in 1 bedside commode    Recommendations for Other Services       Precautions / Restrictions Precautions Precautions: Back;Fall Precaution Booklet Issued: Yes (comment) Required Braces or Orthoses: Spinal Brace Spinal Brace: Lumbar corset;Applied in sitting position Restrictions Weight Bearing Restrictions: No      Mobility Bed Mobility Overal bed mobility: Needs Assistance Bed Mobility: Rolling;Sidelying to Sit Rolling: Min guard Sidelying to sit: Min assist       General bed mobility comments: verbal cues for log roll technique, assist for LEs over EOB and to raise trunk  Transfers Overall transfer level: Needs assistance Equipment used: Rolling walker (2 wheeled) Transfers: Sit to/from Stand Sit to Stand: Min assist         General transfer comment: cues for hand placement, increased time, min to min guard assist to rise and steady    Balance Overall balance assessment: Needs assistance   Sitting balance-Leahy Scale: Fair       Standing balance-Leahy Scale: Poor Standing balance comment: heavily reliant on RW                           ADL either performed or assessed with  clinical judgement   ADL Overall ADL's : Needs assistance/impaired Eating/Feeding: Independent   Grooming: Oral care;Wash/dry hands;Set up;Sitting Grooming Details (indicate cue type and reason): instructed in 2 cup method for oral care, unable to release walker in static standing Upper Body Bathing: Minimal assistance;Sitting Upper Body Bathing Details (indicate cue type and reason): recommended long handled bath sponge for back Lower Body Bathing: Sit to/from stand;Maximal assistance Lower Body Bathing Details (indicate cue type and reason): recommended long handled bath sponge and use of reacher Upper Body Dressing : Set up;Sitting   Lower Body Dressing: Sit to/from stand;Maximal assistance   Toilet Transfer: Min guard;Ambulation;RW;BSC   Toileting- Clothing Manipulation and Hygiene: Moderate assistance;Sit to/from stand Toileting - Clothing Manipulation Details (indicate cue type and reason): instructed to avoid twisting for pericare, use of tongs   Tub/Shower Transfer Details (indicate cue type and reason): educated in use of 3 in 1 as shower seat Functional mobility during ADLs: Min guard;Rolling walker General ADL Comments: Instructed in IADL to avoid.      Vision Baseline Vision/History: Wears glasses Wears Glasses: Reading only Patient Visual Report: No change from baseline       Perception     Praxis      Pertinent Vitals/Pain Pain Assessment: Faces Faces Pain Scale: Hurts even more Pain Location: L side Pain Descriptors / Indicators: Aching Pain Intervention(s): Monitored during session;Premedicated before session;Repositioned     Hand Dominance Right   Extremity/Trunk Assessment Upper Extremity  Assessment Upper Extremity Assessment: Overall WFL for tasks assessed   Lower Extremity Assessment Lower Extremity Assessment: Defer to PT evaluation   Cervical / Trunk Assessment Cervical / Trunk Assessment: Other exceptions Cervical / Trunk Exceptions: s/p  back sx   Communication Communication Communication: No difficulties   Cognition Arousal/Alertness: Awake/alert Behavior During Therapy: WFL for tasks assessed/performed Overall Cognitive Status: Within Functional Limits for tasks assessed                                     General Comments       Exercises     Shoulder Instructions      Home Living Family/patient expects to be discharged to:: Private residence Living Arrangements: Spouse/significant other Available Help at Discharge: Family;Available 24 hours/day Type of Home: House Home Access: Stairs to enter [employer] of Steps: 1   Home Layout: Two level;Able to live on main level with bedroom/bathroom     Bathroom Shower/Tub: Occupational psychologist: Standard     Home Equipment: Environmental consultant - 2 wheels;Adaptive equipment Adaptive Equipment: Reacher        Prior Functioning/Environment Level of Independence: Independent                 OT Problem List: Impaired balance (sitting and/or standing);Decreased knowledge of use of DME or AE;Pain;Obesity      OT Treatment/Interventions: Self-care/ADL training;DME and/or AE instruction;Patient/family education;Balance training;Therapeutic activities    OT Goals(Current goals can be found in the care plan section) Acute Rehab OT Goals Patient Stated Goal: return to PLOF OT Goal Formulation: With patient Time For Goal Achievement: 12/19/19 Potential to Achieve Goals: Good ADL Goals Pt Will Perform Grooming: with supervision;standing Pt Will Perform Lower Body Bathing: with supervision;with adaptive equipment;sit to/from stand Pt Will Perform Lower Body Dressing: with supervision;with adaptive equipment;sit to/from stand Pt Will Transfer to Toilet: with supervision;ambulating;bedside commode Pt Will Perform Toileting - Clothing Manipulation and hygiene: with supervision;sit to/from stand;with adaptive equipment Pt Will Perform  Tub/Shower Transfer: with supervision;ambulating;3 in 1;rolling walker;Shower transfer Additional ADL Goal #1: Pt will perform bed mobility with supervision in preparation for ADL.  OT Frequency: Min 2X/week   Barriers to D/C:            Co-evaluation              AM-PAC OT "6 Clicks" Daily Activity     Outcome Measure Help from another person eating meals?: None Help from another person taking care of personal grooming?: A Little Help from another person toileting, which includes using toliet, bedpan, or urinal?: A Lot Help from another person bathing (including washing, rinsing, drying)?: A Lot Help from another person to put on and taking off regular upper body clothing?: A Little Help from another person to put on and taking off regular lower body clothing?: A Lot 6 Click Score: 16   End of Session Equipment Utilized During Treatment: Rolling walker;Gait belt;Back brace  Activity Tolerance: Patient tolerated treatment well Patient left: in chair;with call bell/phone within reach  OT Visit Diagnosis: Unsteadiness on feet (R26.81);Other abnormalities of gait and mobility (R26.89);Pain                Time: 1610-9604 OT Time Calculation (min): 41 min Charges:  OT General Charges $OT Visit: 1 Visit OT Evaluation $OT Eval Moderate Complexity: 1 Mod OT Treatments $Self Care/Home Management : 23-37 mins  Nestor Lewandowsky, OTR/L  Acute Rehabilitation Services Pager: [phone] Office: [phone]  Malka So 12/05/2019, 11:04 AM

## 2019-12-05 NOTE — Evaluation (Signed)
Physical Therapy Evaluation Patient Details Name: [name] MRN: [id number] DOB: [birth year] Today's Date: 12/05/2019   History of Present Illness  Pt is a 74 year old woman admitted for 2 step back surgery, ALIF 8/25 and PLIF 8/26 L 2-4.  PMH: COPD, CAD, HAs, fibromyalgia, hypothyroidism.     Clinical Impression  Pt admitted with above diagnosis. At the time of PT eval, pt was able to demonstrate transfers and ambulation with gross min guard assist to supervision for safety and RW for support. Pt was educated on precautions, brace application/wearing schedule, appropriate activity progression, and car transfer. Pt currently with functional limitations due to the deficits listed below (see PT Problem List). Pt will benefit from skilled PT to increase their independence and safety with mobility to allow discharge to the venue listed below.      Follow Up Recommendations No PT follow up;Supervision for mobility/OOB    Equipment Recommendations  None recommended by PT    Recommendations for Other Services       Precautions / Restrictions Precautions Precautions: Back;Fall Precaution Booklet Issued: Yes (comment) Required Braces or Orthoses: Spinal Brace Spinal Brace: Lumbar corset;Applied in sitting position Restrictions Weight Bearing Restrictions: No      Mobility  Bed Mobility Overal bed mobility: Needs Assistance Bed Mobility: Rolling;Sit to Sidelying Rolling: Supervision Sidelying to sit: Min assist     Sit to sidelying: Min assist General bed mobility comments: VC's for optimal log roll technique. Pt was able to transition back to bed with min assist for LE elevation.   Transfers Overall transfer level: Needs assistance Equipment used: Rolling walker (2 wheeled) Transfers: Sit to/from Stand Sit to Stand: Min guard         General transfer comment: VC's for hand placement on seated surface for safety. Pt stood from low recliner height with hands-on guarding,  and increased time/effort.   Ambulation/Gait Ambulation/Gait assistance: Min Gaffer (Feet): 300 Feet Assistive device: Rolling walker (2 wheeled) Gait Pattern/deviations: Step-through pattern;Decreased stride length;Trunk flexed Gait velocity: Decreased Gait velocity interpretation: <1.31 ft/sec, indicative of household ambulator General Gait Details: Slow and guarded gait with VC's throughout for improved posture, closer walker proximity, and forward gaze.   Stairs            Wheelchair Mobility    Modified Rankin (Stroke Patients Only)       Balance Overall balance assessment: Needs assistance Sitting-balance support: No upper extremity supported;Feet supported Sitting balance-Leahy Scale: Fair     Standing balance support: Bilateral upper extremity supported;During functional activity Standing balance-Leahy Scale: Poor Standing balance comment: heavily reliant on RW                             Pertinent Vitals/Pain Pain Assessment: Faces Faces Pain Scale: Hurts even more Pain Location: L side Pain Descriptors / Indicators: Aching Pain Intervention(s): Limited activity within patient's tolerance;Monitored during session;Repositioned    Home Living Family/patient expects to be discharged to:: Private residence Living Arrangements: Spouse/significant other Available Help at Discharge: Family;Available 24 hours/day Type of Home: House Home Access: Stairs to enter   [employer] of Steps: 3 Home Layout: Two level;Able to live on main level with bedroom/bathroom Home Equipment: Gilford Rile - 2 wheels;Adaptive equipment      Prior Function Level of Independence: Independent               Hand Dominance   Dominant Hand: Right    Extremity/Trunk Assessment  Upper Extremity Assessment Upper Extremity Assessment: Defer to OT evaluation    Lower Extremity Assessment Lower Extremity Assessment: Generalized  weakness (consistent with pre-op diagnosis)    Cervical / Trunk Assessment Cervical / Trunk Assessment: Other exceptions Cervical / Trunk Exceptions: s/p back sx  Communication   Communication: No difficulties  Cognition Arousal/Alertness: Awake/alert Behavior During Therapy: WFL for tasks assessed/performed Overall Cognitive Status: Within Functional Limits for tasks assessed                                        General Comments      Exercises     Assessment/Plan    PT Assessment Patient needs continued PT services  PT Problem List Decreased strength;Decreased activity tolerance;Decreased balance;Decreased mobility;Decreased knowledge of use of DME;Decreased safety awareness;Decreased knowledge of precautions;Pain       PT Treatment Interventions DME instruction;Gait training;Stair training;Functional mobility training;Therapeutic activities;Therapeutic exercise;Neuromuscular re-education;Patient/family education    PT Goals (Current goals can be found in the Care Plan section)  Acute Rehab PT Goals Patient Stated Goal: return to PLOF PT Goal Formulation: With patient/family Time For Goal Achievement: 12/12/19 Potential to Achieve Goals: Good    Frequency Min 5X/week   Barriers to discharge        Co-evaluation               AM-PAC PT "6 Clicks" Mobility  Outcome Measure Help needed turning from your back to your side while in a flat bed without using bedrails?: None Help needed moving from lying on your back to sitting on the side of a flat bed without using bedrails?: A Little Help needed moving to and from a bed to a chair (including a wheelchair)?: A Little Help needed standing up from a chair using your arms (e.g., wheelchair or bedside chair)?: A Little Help needed to walk in hospital room?: A Little Help needed climbing 3-5 steps with a railing? : A Little 6 Click Score: 19    End of Session Equipment Utilized During Treatment:  Gait belt;Back brace Activity Tolerance: Patient tolerated treatment well Patient left: in bed;with family/visitor present;with nursing/sitter in room Nurse Communication: Mobility status PT Visit Diagnosis: Unsteadiness on feet (R26.81);Pain Pain - part of body:  (back)    Time: 8127-5170 PT Time Calculation (min) (ACUTE ONLY): 22 min   Charges:   PT Evaluation $PT Eval Moderate Complexity: 1 Mod          Rolinda Roan, PT, DPT Acute Rehabilitation Services Pager: [phone] Office: [phone]   Thelma Comp 12/05/2019, 11:52 AM

## 2019-12-06 MED ORDER — METHOCARBAMOL 500 MG PO TABS: 500.0000 mg | ORAL_TABLET | Freq: Three times a day (TID) | ORAL | 0 refills | Status: AC | PRN

## 2019-12-06 MED ORDER — OXYCODONE-ACETAMINOPHEN 10-325 MG PO TABS: 1.0000 | ORAL_TABLET | Freq: Four times a day (QID) | ORAL | 0 refills | Status: AC | PRN

## 2019-12-06 MED ORDER — ONDANSETRON HCL 4 MG PO TABS: 4.0000 mg | ORAL_TABLET | Freq: Three times a day (TID) | ORAL | 0 refills | Status: DC | PRN

## 2019-12-06 NOTE — Progress Notes (Signed)
[name]  MRN: [id number] DOB/Age: 74/10/1945 74 y.o. Physician: [name], M.D. 2 Days Post-Op Procedure(s) (LRB): POSTERIOR LUMBAR FUSION L2-L4 (N/A)  Subjective: Patient sitting in bedside chair resting comfortably.  Lumbar brace in place.  She does report ongoing moderate pain but generally improving. Vital Signs Temp:  [98.2 F (36.8 C)-99.8 F (37.7 C)] 99.8 F (37.7 C) (08/28 0817) Pulse Rate:  [87-92] 90 (08/28 0817) Resp:  [18-20] 20 (08/28 0817) BP: (111-124)/(64-75) 123/75 (08/28 0817) SpO2:  [96 %-99 %] 98 % (08/28 0817)  Lab Results Recent Labs    12/04/19 0559  WBC 15.1*  HGB 10.8*  HCT 33.9*  PLT 218   BMET Recent Labs    12/04/19 0559  NA 137  K 3.8  CL 102  CO2 24  GLUCOSE 135*  BUN 10  CREATININE 0.78  CALCIUM 8.3*   INR  Date Value Ref Range Status  12/04/2019 1.1 0.8 - 1.2 Final    Comment:    (NOTE) INR goal varies based on device and disease states. Performed at [facility], [place] [address] [place], [place] [address]      Exam  Inspection of lumbar and flank incisions demonstrate dressings to be in place without drainage or erythema.  She is grossly neurovascular intact distally.  Post void residual 24 cc.  Patient does report the ability to feel when she has to go to the bathroom.  However she does report that she has had some "dribbling".  Plan DC home today with postop plan per Dr. Rolena Black.  We will arrange for home health PT/OT.  Follow-up visit as per Dr. Delma Freeze M Keshawn Black 12/06/2019, 9:18 AM   Contact # [phone]

## 2019-12-06 NOTE — Progress Notes (Signed)
Patient is discharged from room 3C03 at this time. Alert and in stable condition. IV site d/c'd and instructions read to patient and spouse with understanding verbalized and all questions answered. Left unit via wheelchair with all belongings at side. ?

## 2019-12-06 NOTE — Progress Notes (Signed)
Occupational Therapy Treatment Patient Details Name: [name] MRN: [id number] DOB: 1945-11-11 Today's Date: 12/06/2019    History of present illness Pt is a 74 year old woman admitted for 2 step back surgery, ALIF 8/25 and PLIF 8/26 L 2-4.  PMH: COPD, CAD, HAs, fibromyalgia, hypothyroidism.    OT comments  Patient continues to make steady progress towards goals in skilled OT session. Patient's session encompassed education with regard to adaptive equipment with demonstration and teach back completed. Pt able to appropriately verbalize all precautions, and handout given at end of the session in order to allow for purchase of AE. Pt left with all questions answered and needs met. Discharge remains appropriate; will continue to follow acutely.    Follow Up Recommendations  No OT follow up    Equipment Recommendations  3 in 1 bedside commode    Recommendations for Other Services      Precautions / Restrictions Precautions Precautions: Back;Fall Precaution Booklet Issued: No Precaution Comments: OTA verbally reviewed precautions, pt able to verbalize with cues for BLT acronym Required Braces or Orthoses: Spinal Brace Spinal Brace: Lumbar corset;Applied in sitting position Restrictions Weight Bearing Restrictions: No       Mobility Bed Mobility Overal bed mobility: Needs Assistance Bed Mobility: Rolling;Sidelying to Sit Rolling: Supervision Sidelying to sit: Min guard          Transfers Overall transfer level: Needs assistance Equipment used: Rolling walker (2 wheeled) Transfers: Sit to/from Stand Sit to Stand: Min guard         General transfer comment: verbal cues for hand placement    Balance Overall balance assessment: Needs assistance Sitting-balance support: No upper extremity supported;Feet supported Sitting balance-Leahy Scale: Good     Standing balance support: Bilateral upper extremity supported Standing balance-Leahy Scale: Poor Standing  balance comment: reliant on RW                           ADL either performed or assessed with clinical judgement   ADL Overall ADL's : Needs assistance/impaired                                     Functional mobility during ADLs: Min guard;Rolling walker General ADL Comments: Session focus on AE training with teach back     Vision       Perception     Praxis      Cognition Arousal/Alertness: Awake/alert Behavior During Therapy: WFL for tasks assessed/performed Overall Cognitive Status: Within Functional Limits for tasks assessed                                          Exercises     Shoulder Instructions       General Comments VSS on RA    Pertinent Vitals/ Pain       Pain Assessment: Faces Pain Score: 10-Worst pain ever Faces Pain Scale: Hurts a little bit Pain Location: back Pain Descriptors / Indicators: Aching;Discomfort Pain Intervention(s): Premedicated before session  Home Living                                          Prior Functioning/Environment  Frequency  Min 2X/week        Progress Toward Goals  OT Goals(current goals can now be found in the care plan section)  Progress towards OT goals: Progressing toward goals  Acute Rehab OT Goals Patient Stated Goal: return to PLOF OT Goal Formulation: With patient Time For Goal Achievement: 12/19/19 Potential to Achieve Goals: Good  Plan Discharge plan remains appropriate    Co-evaluation                 AM-PAC OT "6 Clicks" Daily Activity     Outcome Measure   Help from another person eating meals?: None Help from another person taking care of personal grooming?: A Little Help from another person toileting, which includes using toliet, bedpan, or urinal?: A Little Help from another person bathing (including washing, rinsing, drying)?: A Lot Help from another person to put on and taking off regular upper  body clothing?: A Little Help from another person to put on and taking off regular lower body clothing?: A Little 6 Click Score: 18    End of Session Equipment Utilized During Treatment: Back brace  OT Visit Diagnosis: Unsteadiness on feet (R26.81);Other abnormalities of gait and mobility (R26.89);Pain   Activity Tolerance Patient tolerated treatment well   Patient Left in chair;with call bell/phone within reach;with family/visitor present   Nurse Communication Mobility status        Time: 2081-3887 OT Time Calculation (min): 14 min  Charges: OT General Charges $OT Visit: 1 Visit OT Treatments $Self Care/Home Management : 8-22 mins  Corinne Ports E. Lapel, [place] Acute Rehabilitation Services [place] 12/06/2019, 9:38 AM

## 2019-12-06 NOTE — Discharge Summary (Signed)
PATIENT ID:      [name]  MRN:     [id number] DOB/AGE:    05-16-1945 / 74 y.o.     DISCHARGE SUMMARY  ADMISSION DATE:    12/03/2019 DISCHARGE DATE:  12/06/2019  ADMISSION DIAGNOSIS: Degenerative scoliosis with spinal stenosis Past Medical History:  Diagnosis Date  . Cataract    both eyes rt. eye was removed  . Chest pain    stress test @ [facility] cardiology (-)  . Colonic polyp    bx adenomatous polyps, next scope 2010  . COPD (chronic obstructive pulmonary disease) ([place])   . Fibromyalgia    see rheumatology  . Glaucoma    not glaucoma surgically removed growths on cornea  . Hypothyroidism   . Migraine   . 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 Osteoporosis    osteopenia  . Pleuritic chest pain    etiology unclear, has beeb eval by pulmonary and rheumatology (autoimmune dz?)  . Wrist fracture 2018    DISCHARGE DIAGNOSIS:   Active Problems:   Fusion of lumbar spine   PROCEDURE: Procedure(s): POSTERIOR LUMBAR FUSION L2-L4 on 12/04/2019  CONSULTS:    HISTORY:  See H&P in chart.  HOSPITAL COURSE:  RYLINN LINZY is a 74 y.o. admitted on 12/03/2019 with a diagnosis of Degenerative scoliosis with spinal stenosis.  They were brought to the operating room on 12/04/2019 and underwent Procedure(s): POSTERIOR LUMBAR FUSION L2-L4.    They were given perioperative antibiotics:  Anti-infectives (From admission, onward)   Start     Dose/Rate Route Frequency Ordered Stop   12/04/19 2230  vancomycin (VANCOCIN) IVPB 1000 mg/200 mL premix        1,000 mg 200 mL/hr over 60 Minutes Intravenous  Once 12/04/19 1553 12/05/19 0838   12/04/19 0900  vancomycin (VANCOCIN) IVPB 1000 mg/200 mL premix        1,000 mg 200 mL/hr over 60 Minutes Intravenous To [facility] 12/04/19 0243 12/04/19 1624   12/03/19 1900  vancomycin (VANCOCIN) IVPB 1000 mg/200 mL premix        1,000 mg 200 mL/hr over 60 Minutes Intravenous  Once 12/03/19 1355 12/04/19 1020   12/03/19 0551  vancomycin  (VANCOCIN) IVPB 1000 mg/200 mL premix        1,000 mg 200 mL/hr over 60 Minutes Intravenous 60 min pre-op 12/03/19 0551 12/04/19 1020    .  Patient underwent the above named procedure and tolerated it well. The following day they were hemodynamically stable and pain was controlled on oral analgesics. She remained neurovascularly intact. She progressed and had good bladder and bowel function by discharge .PT OT was ordered and worked with patient per protocol. They were medically and orthopaedically stable for discharge on 12/06/2019. Home health was ordered as recommended by therapy    DIAGNOSTIC STUDIES:  RECENT RADIOGRAPHIC STUDIES :  DG Chest 2 View  Result Date: 12/01/2019 CLINICAL DATA:  Patient for lumbar surgery.  Preoperative exam. EXAM: CHEST - 2 VIEW COMPARISON:  PA and lateral chest 07/24/2019. FINDINGS: The lungs are clear. Heart size is normal. No pneumothorax or pleural effusion. No acute or focal bony abnormality. Scoliosis noted. IMPRESSION: No acute disease. Electronically Signed   By: Inge Rise M.D.   On: 12/01/2019 15:04   DG Lumbar Spine 2-3 Views  Result Date: 12/04/2019 CLINICAL DATA:  L2-4 posterior fusion EXAM: LUMBAR SPINE - 2-3 VIEW; DG C-ARM 1-60 MIN COMPARISON:  12/03/2019 FINDINGS: 3 C-arm fluoroscopic images were obtained intraoperatively and  submitted for post operative interpretation. Interval placement of transpedicular rod and screw fixation at the L2-L4 levels. Hardware appears well seated. Interbody cage spacers remain in place. 3 minutes 31 seconds of fluoroscopy time was utilized. Please see the performing provider's procedural report for further detail. IMPRESSION: As above. Electronically Signed   By: Davina Poke D.O.   On: 12/04/2019 14:39   DG Lumbar Spine Complete  Result Date: 12/03/2019 CLINICAL DATA:  Lumbar fusion. EXAM: LUMBAR SPINE - COMPLETE 4+ VIEW; DG C-ARM 1-60 MIN COMPARISON:  Lumbar spine MRI 07/24/2015 FLUOROSCOPY TIME:   Fluoroscopy Time:  2 minutes 15 seconds Radiation Exposure Index (if provided by the fluoroscopic device): 77.37 mGy Number of Provided images: 4 FINDINGS: The lowest fully formed intervertebral disc space is designated L5-S1. Interbody cages have been placed at L2-3 and L3-4. IMPRESSION: Intraoperative images during L2-L4 lateral interbody fusion. Electronically Signed   By: Logan Bores M.D.   On: 12/03/2019 11:23   DG C-Arm 1-60 Min  Result Date: 12/04/2019 CLINICAL DATA:  L2-4 posterior fusion EXAM: LUMBAR SPINE - 2-3 VIEW; DG C-ARM 1-60 MIN COMPARISON:  12/03/2019 FINDINGS: 3 C-arm fluoroscopic images were obtained intraoperatively and submitted for post operative interpretation. Interval placement of transpedicular rod and screw fixation at the L2-L4 levels. Hardware appears well seated. Interbody cage spacers remain in place. 3 minutes 31 seconds of fluoroscopy time was utilized. Please see the performing provider's procedural report for further detail. IMPRESSION: As above. Electronically Signed   By: Davina Poke D.O.   On: 12/04/2019 14:39   DG C-Arm 1-60 Min  Result Date: 12/03/2019 CLINICAL DATA:  Lumbar fusion. EXAM: LUMBAR SPINE - COMPLETE 4+ VIEW; DG C-ARM 1-60 MIN COMPARISON:  Lumbar spine MRI 07/24/2015 FLUOROSCOPY TIME:  Fluoroscopy Time:  2 minutes 15 seconds Radiation Exposure Index (if provided by the fluoroscopic device): 77.37 mGy Number of Provided images: 4 FINDINGS: The lowest fully formed intervertebral disc space is designated L5-S1. Interbody cages have been placed at L2-3 and L3-4. IMPRESSION: Intraoperative images during L2-L4 lateral interbody fusion. Electronically Signed   By: Logan Bores M.D.   On: 12/03/2019 11:23   DG OR LOCAL ABDOMEN  Result Date: 12/03/2019 CLINICAL DATA:  Surgical lumbar fusion. EXAM: OR LOCAL ABDOMEN COMPARISON:  None. FINDINGS: The bowel gas pattern is normal. No radio-opaque calculi or other significant radiographic abnormality are seen.  Patient appears to be status post surgical fusion of L2-3 and L3-4. Surgical stable is seen projected over L4 vertebral body. Another surgical staple is seen to the left of L3 vertebral body. No other radiopaque foreign body is noted. IMPRESSION: Surgical fusion of L2-3 and L3-4. Several surgical staples are noted as described above. No other radiopaque foreign body is noted. These results were called by telephone at the time of interpretation on 12/03/2019 at 10:28 am to provider [place] in [place], who verbally acknowledged these results. Electronically Signed   By: Marijo Conception M.D.   On: 12/03/2019 10:30    RECENT VITAL SIGNS:   Patient Vitals for the past 24 hrs:  BP Temp Temp src Pulse Resp SpO2  12/06/19 0817 123/75 99.8 F (37.7 C) Oral 90 20 98 %  12/06/19 0421 124/73 98.6 F (37 C) Oral 87 18 96 %  12/05/19 2308 111/65 98.2 F (36.8 C) Oral 92 18 99 %  12/05/19 2031 118/64 98.6 F (37 C) Oral 92 20 98 %  12/05/19 1600 116/71 98.9 F (37.2 C) Oral 87 18 96 %  .  RECENT EKG RESULTS:    Orders placed or performed in visit on 11/24/19  . EKG 12-Lead    DISCHARGE INSTRUCTIONS:  Discharge Instructions    Face-to-face encounter (required for Medicare/Medicaid patients)   Complete by: As directed    I Jenetta Loges certify that this patient is under my care and that I, or a nurse practitioner or physician's assistant working with me, had a face-to-face encounter that meets the physician face-to-face encounter requirements with this patient on 12/06/2019. The encounter with the patient was in whole, or in part for the following medical condition(s) which is the primary reason for home health care (List medical condition): spine surgery   The encounter with the patient was in whole, or in part, for the following medical condition, which is the primary reason for home health care: yes   I certify that, based on my findings, the following services are medically necessary home health  services: Physical therapy   Reason for Medically Necessary Home Health Services: Therapy- Therapeutic Exercises to Increase Strength and Endurance   My clinical findings support the need for the above services: Unable to leave home safely without assistance and/or assistive device   Further, I certify that my clinical findings support that this patient is homebound due to: Unable to leave home safely without assistance   Home Health   Complete by: As directed    To provide the following care/treatments:  PT OT     Incentive spirometry RT   Complete by: As directed    Incentive spirometry RT   Complete by: As directed       DISCHARGE MEDICATIONS:   Allergies as of 12/06/2019      Reactions   Alendronate Sodium Hives   Bactrim [sulfamethoxazole-trimethoprim]    Upset stomach   Contrast Media [iodinated Diagnostic Agents] Hives   Iodine Hives   Milnacipran    REACTION: increased bp, increased hr   Penicillins Hives      Medication List    STOP taking these medications   aspirin EC 81 MG tablet   Calcium Polycarbophil 625 MG Chew   denosumab 60 MG/ML Sosy injection Commonly known as: PROLIA   Estradiol 10 MCG Tabs vaginal tablet Commonly known as: Yuvafem   HYDROcodone-acetaminophen 7.5-325 MG tablet Commonly known as: NORCO     TAKE these medications   Advair Diskus 100-50 MCG/DOSE Aepb Generic drug: Fluticasone-Salmeterol Inhale 1 puff into the lungs in the morning and at bedtime.   ALPRAZolam 0.5 MG tablet Commonly known as: XANAX TAKE 1 TABLET BY MOUTH AT BEDTIME AS NEEDED FOR ANXIETY.   atorvastatin 10 MG tablet Commonly known as: LIPITOR Take 1 tablet (10 mg total) by mouth at bedtime.   calcium carbonate 1250 (500 Ca) MG chewable tablet Commonly known as: OS-CAL Chew 1 tablet by mouth 2 (two) times daily.   hydroxypropyl methylcellulose / hypromellose 2.5 % ophthalmic solution Commonly known as: ISOPTO TEARS / GONIOVISC Place 1 drop into both eyes 3  (three) times daily as needed for dry eyes.   levothyroxine 88 MCG tablet Commonly known as: SYNTHROID Take 1 tablet (88 mcg total) by mouth daily before breakfast.   methocarbamol 500 MG tablet Commonly known as: Robaxin Take 1 tablet (500 mg total) by mouth every 8 (eight) hours as needed for muscle spasms.   multivitamin capsule Take 1 capsule by mouth daily.   ondansetron 4 MG tablet Commonly known as: Zofran Take 1 tablet (4 mg total) by mouth every 8 (eight) hours  as needed for nausea or vomiting.   oxyCODONE-acetaminophen 10-325 MG tablet Commonly known as: Percocet Take 1 tablet by mouth every 6 (six) hours as needed for up to 5 days for pain.   Probiotic-10 Chew Chew 1 tablet by mouth daily.   vitamin B-12 1000 MCG tablet Commonly known as: CYANOCOBALAMIN Take 1,000 mcg by mouth daily.   Vitamin D3 50 MCG (2000 UT) Chew Chew 2,000 Units by mouth daily.       FOLLOW UP VISIT:    Follow-up Information    Melina Schools, MD. Schedule an appointment as soon as possible for a visit in 2 weeks.   Specialty: Orthopedic Surgery Why: If symptoms worsen, For suture removal, For wound re-check Contact information: [address] [place] [address] [phone]               DISCHARGE TO: Home   DISCHARGE CONDITION:  Festus Barren , PA-C 12/06/2019, 1:14 PM

## 2019-12-06 NOTE — Progress Notes (Signed)
Physical Therapy Treatment Patient Details Name: [name] MRN: [id number] DOB: 1945-08-22 Today's Date: 12/06/2019    History of Present Illness Pt is a 74 year old woman admitted for 2 step back surgery, ALIF 8/25 and PLIF 8/26 L 2-4.  PMH: COPD, CAD, HAs, fibromyalgia, hypothyroidism.     PT Comments    Pt tolerates treatment well, although does report high pain level. Pt demonstrates improved bed mobility quality, requiring less physical assistance at this time. Pt continues to demonstrate some LE weakness which limits her power during transfers, requiring assistance for safety. Pt declines stair negotiation this session as the steps in the stairwell are much steeper than wheat she will have to negotiate at home. Pt also reports that the 3 steps she will have to navigate at home are not consecutive and are more small thresholds than stairs. Pt reports she feels comfortable with her ability to negotiate these thresholds and declines stair training at this time. Pt will continue to benefit from acute PT services to improve mobility quality and LE strength/power. PT recommends discharge home with HHPT and a 3in1 commode.  Follow Up Recommendations  Home health PT;Supervision for mobility/OOB     Equipment Recommendations  3in1 (PT)    Recommendations for Other Services       Precautions / Restrictions Precautions Precautions: Back;Fall Precaution Booklet Issued: No Precaution Comments: PT verbally reviews precautions, pt able to verbalize with cues for BLT acronym Required Braces or Orthoses: Spinal Brace Spinal Brace: Lumbar corset;Applied in sitting position Restrictions Weight Bearing Restrictions: No    Mobility  Bed Mobility Overal bed mobility: Needs Assistance Bed Mobility: Rolling;Sidelying to Sit Rolling: Supervision Sidelying to sit: Min guard          Transfers Overall transfer level: Needs assistance Equipment used: Rolling walker (2  wheeled) Transfers: Sit to/from Stand Sit to Stand: Min guard         General transfer comment: verbal cues for hand placement  Ambulation/Gait Ambulation/Gait assistance: Supervision Gait Distance (Feet): 200 Feet Assistive device: Rolling walker (2 wheeled) Gait Pattern/deviations: Step-through pattern;Decreased stride length;Trunk flexed Gait velocity: Decreased Gait velocity interpretation: <1.8 ft/sec, indicate of risk for recurrent falls General Gait Details: pt with shortened step through gait, reduced gait speed, slight increase in trunk flexion to increase WB through UEs on RW   Stairs Stairs:  (pt declines stair negotiation, read assessment)           Wheelchair Mobility    Modified Rankin (Stroke Patients Only)       Balance Overall balance assessment: Needs assistance Sitting-balance support: No upper extremity supported;Feet supported Sitting balance-Leahy Scale: Good     Standing balance support: Bilateral upper extremity supported Standing balance-Leahy Scale: Poor Standing balance comment: reliant on RW                            Cognition Arousal/Alertness: Awake/alert Behavior During Therapy: WFL for tasks assessed/performed Overall Cognitive Status: Within Functional Limits for tasks assessed                                        Exercises      General Comments General comments (skin integrity, edema, etc.): VSS on RA      Pertinent Vitals/Pain Pain Assessment: 0-10 Pain Score: 10-Worst pain ever Pain Location: back Pain Descriptors / Indicators: Aching Pain  Intervention(s): Premedicated before session    Home Living                      Prior Function            PT Goals (current goals can now be found in the care plan section) Acute Rehab PT Goals Patient Stated Goal: return to PLOF Progress towards PT goals: Progressing toward goals    Frequency    Min 5X/week      PT Plan  Current plan remains appropriate    Co-evaluation              AM-PAC PT "6 Clicks" Mobility   Outcome Measure  Help needed turning from your back to your side while in a flat bed without using bedrails?: None Help needed moving from lying on your back to sitting on the side of a flat bed without using bedrails?: A Little Help needed moving to and from a bed to a chair (including a wheelchair)?: A Little Help needed standing up from a chair using your arms (e.g., wheelchair or bedside chair)?: A Little Help needed to walk in hospital room?: A Little Help needed climbing 3-5 steps with a railing? : A Little 6 Click Score: 19    End of Session Equipment Utilized During Treatment: Gait belt;Back brace Activity Tolerance: Patient tolerated treatment well (some pain limiting activity tolerance) Patient left: in chair;with call bell/phone within reach Nurse Communication: Mobility status PT Visit Diagnosis: Unsteadiness on feet (R26.81);Pain Pain - part of body:  (back)     Time: 3570-1779 PT Time Calculation (min) (ACUTE ONLY): 27 min  Charges:  $Gait Training: 8-22 mins $Therapeutic Activity: 8-22 mins                     Zenaida Niece, PT, DPT Acute Rehabilitation Pager: [phone]    Zenaida Niece 12/06/2019, 9:16 AM

## 2019-12-06 NOTE — TOC Transition Note (Signed)
Transition of Care [facility]) - CM/SW Discharge Note   Patient Details  Name: [name] MRN: [id number] Date of Birth: [birth year]  Transition of Care [facility]) CM/SW Contact:  Claudie Leach, RN 12/06/2019, 4:11 PM   Clinical Narrative:    Patient to dc home with HHPT.  Patient has no preference of agency.  Referral accepted by [place] with [facility]  Patient updated.    Final next level of care: [place] Barriers to Discharge: No Barriers Identified   Patient Goals and CMS Choice Patient states their goals for this hospitalization and ongoing recovery are:: to get well CMS Medicare.gov Compare Post Acute Care list provided to:: Patient Choice offered to / list presented to : Patient   Discharge Plan and Services    HH Arranged: PT [facility] Agency: [facility] [place] Date [facility] Agency Contacted: 12/06/19 Time [place]: 1300 Representative spoke with at [place]: [place]

## 2019-12-10 ENCOUNTER — Other Ambulatory Visit: Payer: Self-pay | Admitting: *Deleted

## 2019-12-10 ENCOUNTER — Encounter: Payer: Self-pay | Admitting: *Deleted

## 2019-12-10 NOTE — Patient Outreach (Signed)
[name]) Care Management [facility] CM Telephone Outreach, [place] notification/ General discharge PCP office completes Transition of Care follow up post-hospital discharge Post-hospital discharge day # [name] x 1   12/10/2019  [name] [birth year] [id number]  EMMI Red-Alert notification/ General Discharge EMMI call date/ day #: Monday December 08, 2019; day # 1 Red-Alert reason(s): "don't know who to call for changes in condition;" "not taking medications;" "wounds not healing;" "Other questions/ problems"  Successful initial telephone outreach to [name], 74 y/o female referred to [place] 12/09/19 by [facility] CMA for [place] notification as above.  Patient had recent elective hospitalization August 25-28, 2021 for lumbar fusion; she was discharged home to self-care with home health services for PT/ OT through [facility] agency. Patient has otherwise not had any recent unplanned hospital admissions.  Patient has history including, but not limited to, COPD; glaucoma; GERD; fibromyalgia; degenernative scoliosis with spinal stenosis.  HIPAA/ identity verified with patient and purpose of call/ [facility] CM services discussed with patient; patient reports doing fair since her hospital discharge; states she visited with her surgeon yesterday and her biggest concern is around pain management which surgeon has addressed; states she will pick up pain medication as ordered from her pharmacy tomorrow, when it is available.  Patient also reports that she has not yet heard from anyone from the home health agency; reviewed medical record and provided patient with contact information for home health agency [facility], as per referral placed by hospital RN CM on December 06, 2019  1:40 pm: [facility] CM Telephone Outreach Care Coordination- placed call to [place] [phone]) with patient on line via 3-way call; spoke with "Star" who  reports that she is unable to locate recently placed referral; I provided details to Star around referral source at hospital; she reports she will have the nurse that accepted the order [place]) contact the patient to follow up.  Star reports that [place] should be contacting patient promptly, within the next 15- 20 minutes.  Confirmed with patient that she understands whom to contact for ongoing concerns/ issues, and provided my direct phone number to her should she wish to speak with me again.  Explained that should she trigger additional alerts from automated hospital discharge calls, she will get another phone call to follow up.  Patient confirms that she has established reliable transportation to all provider appointments and contact information for care providers; she declines additional needs today, but states she will contact me should concerns arise that she requires assistance with.  Patient denies further issues, concerns, or problems today and no concerns were identified after speaking with patient, who does not wish to miss call from [facility] home health team; EMMI Red screening partially completed.  I confirmed that patient has my direct phone number, the main [facility] CM office phone number, and the [facility] CM 24-hour nurse advice phone number should issues arise that she need assistance with.  Plan:  Will make patient inactive with [facility] CM as no ongoing needs were identified through partial screening for EMMI Red-Alert notification, now that home health agency has been contacted for initiation of services; will place successful outreach letter in mail to patient should needs arise in the future  Oneta Rack, RN, BSN, [employer] Coordinator [facility] Care Management  [phone]

## 2019-12-11 DIAGNOSIS — M5416 Radiculopathy, lumbar region: Secondary | ICD-10-CM | POA: Diagnosis not present

## 2019-12-11 DIAGNOSIS — Z981 Arthrodesis status: Secondary | ICD-10-CM | POA: Diagnosis not present

## 2019-12-12 ENCOUNTER — Other Ambulatory Visit: Payer: Self-pay | Admitting: *Deleted

## 2019-12-12 ENCOUNTER — Encounter: Payer: Self-pay | Admitting: *Deleted

## 2019-12-12 DIAGNOSIS — M5137 Other intervertebral disc degeneration, lumbosacral region: Secondary | ICD-10-CM | POA: Diagnosis not present

## 2019-12-12 DIAGNOSIS — M797 Fibromyalgia: Secondary | ICD-10-CM | POA: Diagnosis not present

## 2019-12-12 DIAGNOSIS — I2584 Coronary atherosclerosis due to calcified coronary lesion: Secondary | ICD-10-CM | POA: Diagnosis not present

## 2019-12-12 DIAGNOSIS — J449 Chronic obstructive pulmonary disease, unspecified: Secondary | ICD-10-CM | POA: Diagnosis not present

## 2019-12-12 DIAGNOSIS — G8929 Other chronic pain: Secondary | ICD-10-CM | POA: Diagnosis not present

## 2019-12-12 DIAGNOSIS — Z4789 Encounter for other orthopedic aftercare: Secondary | ICD-10-CM | POA: Diagnosis not present

## 2019-12-12 DIAGNOSIS — I2510 Atherosclerotic heart disease of native coronary artery without angina pectoris: Secondary | ICD-10-CM | POA: Diagnosis not present

## 2019-12-12 DIAGNOSIS — M4126 Other idiopathic scoliosis, lumbar region: Secondary | ICD-10-CM | POA: Diagnosis not present

## 2019-12-12 DIAGNOSIS — M48062 Spinal stenosis, lumbar region with neurogenic claudication: Secondary | ICD-10-CM | POA: Diagnosis not present

## 2019-12-12 DIAGNOSIS — Z4782 Encounter for orthopedic aftercare following scoliosis surgery: Secondary | ICD-10-CM | POA: Diagnosis not present

## 2019-12-12 NOTE — Patient Outreach (Signed)
[facility]) Care Management  12/12/2019  [name] Jul 02, 1945 [id number]  EMMI Red-Alert notification/ General Discharge EMMI call date/ day #: Thursday December 11, 2019; day # 4 Red-Alert reason(s): "sad, hopeless, anxious, empty"  Successful telephone outreach to [name], 74 y/o female referred to [facility] RN CM 12/09/19 and again this morning by [facility] CMA for [place] notification as above.  Patient had recent elective hospitalization August 25-28, 2021 for lumbar fusion; she was discharged home to self-care with home health services for PT/ OT through [facility] agency. Patient has otherwise not had any recent unplanned hospital admissions.  Patient has history including, but not limited to, COPD; glaucoma; GERD; fibromyalgia; degenernative scoliosis with spinal stenosis.  HIPAA/ identity verified with patient and purpose of call/ [facility] CM services discussed with patient; patient reports doing much better since our previous phone call on December 10, 2019; states that after our call to the home health agency, services have been initiated and both PT/ OT have visited her.  Reports pain "much better-- better every day" and she denies clinical concerns, issues problems.  Discussed this morning's EMMI Red-Alert with patient who reports that she has been having anxiety, but "nothing outside of my normal;" and reports that she takes Xanax prn for same.  Depression screening completed and no concerns identified.  Encouraged patient to contact her care providers/ PCP should she believe her anxiety is worsening, and she verbalizes understanding and agreement as well as appreciation for the call today.   Re- Confirmed with patient that she understands whom to contact for ongoing concerns/ issues, and provided my direct phone number to her should she wish to speak with me again.  She is agreeable.  Patient denies further issues, concerns, or problems today and no concerns  were identified after speaking with patient.  I confirmed that patient hasmy direct phone number, the main [facility] CM office phone number, and the [facility] CM 24-hour nurse advice phone number should issues arise in the future that she need assistance with.  Plan:  Will make patient inactive with [facility] CM as no ongoing needs were identified through partial screening for EMMI Red-Alert notification  Oneta Rack, RN, BSN, [place] Care Management  [phone]

## 2019-12-16 DIAGNOSIS — G8929 Other chronic pain: Secondary | ICD-10-CM | POA: Diagnosis not present

## 2019-12-16 DIAGNOSIS — Z4789 Encounter for other orthopedic aftercare: Secondary | ICD-10-CM | POA: Diagnosis not present

## 2019-12-16 DIAGNOSIS — J449 Chronic obstructive pulmonary disease, unspecified: Secondary | ICD-10-CM | POA: Diagnosis not present

## 2019-12-16 DIAGNOSIS — Z4782 Encounter for orthopedic aftercare following scoliosis surgery: Secondary | ICD-10-CM | POA: Diagnosis not present

## 2019-12-16 DIAGNOSIS — M48062 Spinal stenosis, lumbar region with neurogenic claudication: Secondary | ICD-10-CM | POA: Diagnosis not present

## 2019-12-16 DIAGNOSIS — M797 Fibromyalgia: Secondary | ICD-10-CM | POA: Diagnosis not present

## 2019-12-16 DIAGNOSIS — H04123 Dry eye syndrome of bilateral lacrimal glands: Secondary | ICD-10-CM | POA: Diagnosis not present

## 2019-12-16 DIAGNOSIS — M4126 Other idiopathic scoliosis, lumbar region: Secondary | ICD-10-CM | POA: Diagnosis not present

## 2019-12-16 DIAGNOSIS — H2513 Age-related nuclear cataract, bilateral: Secondary | ICD-10-CM | POA: Diagnosis not present

## 2019-12-16 DIAGNOSIS — H04332 Acute lacrimal canaliculitis of left lacrimal passage: Secondary | ICD-10-CM | POA: Diagnosis not present

## 2019-12-16 DIAGNOSIS — I2584 Coronary atherosclerosis due to calcified coronary lesion: Secondary | ICD-10-CM | POA: Diagnosis not present

## 2019-12-16 DIAGNOSIS — I2510 Atherosclerotic heart disease of native coronary artery without angina pectoris: Secondary | ICD-10-CM | POA: Diagnosis not present

## 2019-12-16 DIAGNOSIS — M5137 Other intervertebral disc degeneration, lumbosacral region: Secondary | ICD-10-CM | POA: Diagnosis not present

## 2019-12-24 ENCOUNTER — Other Ambulatory Visit: Payer: Self-pay

## 2019-12-24 ENCOUNTER — Encounter: Payer: Self-pay | Admitting: Internal Medicine

## 2019-12-24 ENCOUNTER — Telehealth: Payer: Self-pay

## 2019-12-24 MED ORDER — DENOSUMAB 60 MG/ML ~~LOC~~ SOSY: 60.0000 mg | PREFILLED_SYRINGE | SUBCUTANEOUS | 0 refills | Status: AC

## 2019-12-24 NOTE — Telephone Encounter (Signed)
Summary of benefits indicates that this patient does have coverage for Prolia through part B. She can obtain this medication through the pharmacy, if billed via medical benefits.  A PA is required, and one has been initiated via [email or web address].  Patient's out of pocket cost is 20% if MD office provides the injection or if provided by a pharmacy.   Pt's total cost associated with this medication is $275 for medication and administration. Before contacting pt to schedule, PA must come back as approved, and it must be determined whether the pt can get this from the pharmacy or provided by MD office as the easiest route for the pt.   Information for PA for Prolia"  [name]  Key: [id number]  PA Case ID: [id number] Need help? Call us at [phone] Status: Sent to [facility] Drug: Prolia 60MG /ML syringes Form: [employer] Electronic PA Form [phone] NCPDP)  Your information has been submitted to [place] Medicare Part D. [employer] Part D will review the request and will issue a decision, typically within 1-3 days from your submission. You can check the updated outcome later by reopening this request.  If [employer] Part D has not responded in 1-3 days or if you have any questions about your ePA request, please contact [place] Medicare Part D at [phone]. If you think there may be a problem with your PA request, use our live chat feature at the bottom right.

## 2019-12-24 NOTE — Telephone Encounter (Signed)
Erroneous encounter

## 2019-12-25 ENCOUNTER — Other Ambulatory Visit: Payer: Self-pay | Admitting: Internal Medicine

## 2019-12-25 MED ORDER — HYDROCODONE-ACETAMINOPHEN 7.5-325 MG PO TABS
1.0000 | ORAL_TABLET | Freq: Four times a day (QID) | ORAL | 0 refills | Status: DC | PRN
Start: 2019-12-25 — End: 2020-01-26

## 2019-12-30 ENCOUNTER — Ambulatory Visit: Payer: Medicare HMO | Admitting: Internal Medicine

## 2020-01-09 ENCOUNTER — Encounter (HOSPITAL_COMMUNITY): Payer: Self-pay | Admitting: Orthopedic Surgery

## 2020-01-16 DIAGNOSIS — Z4889 Encounter for other specified surgical aftercare: Secondary | ICD-10-CM | POA: Diagnosis not present

## 2020-01-19 DIAGNOSIS — R69 Illness, unspecified: Secondary | ICD-10-CM | POA: Diagnosis not present

## 2020-01-24 ENCOUNTER — Ambulatory Visit: Payer: Medicare HMO | Attending: Internal Medicine

## 2020-01-24 ENCOUNTER — Other Ambulatory Visit: Payer: Self-pay

## 2020-01-24 DIAGNOSIS — Z23 Encounter for immunization: Secondary | ICD-10-CM

## 2020-01-24 NOTE — Progress Notes (Signed)
   Covid-19 Vaccination Clinic  Name:  [name]    MRN: [id number] DOB: February 06, 1946  01/24/2020  [name] was observed post Covid-19 immunization for 15 minutes without incident. She was provided with Vaccine Information Sheet and instruction to access the V-Safe system.   [name] was instructed to call 911 with any severe reactions post vaccine: Marland Kitchen Difficulty breathing  . Swelling of face and throat  . A fast heartbeat  . A bad rash all over body  . Dizziness and weakness

## 2020-01-26 ENCOUNTER — Telehealth: Payer: Self-pay | Admitting: Internal Medicine

## 2020-01-26 ENCOUNTER — Other Ambulatory Visit: Payer: Self-pay | Admitting: Internal Medicine

## 2020-01-26 MED ORDER — HYDROCODONE-ACETAMINOPHEN 7.5-325 MG PO TABS
1.0000 | ORAL_TABLET | Freq: Four times a day (QID) | ORAL | 0 refills | Status: DC | PRN
Start: 2020-01-26 — End: 2020-02-24

## 2020-01-26 NOTE — Telephone Encounter (Signed)
Requesting: hydrocodone-acetaminophen 7.5-325mg  Contract: 03/13/2018 UDS: 04/01/2019 Low risk Last Visit: 09/24/2019 Next Visit: 01/29/2020 Last Refill: 12/25/2019 #120 and 0RF Pt sig: 1 tab q6h prn  Please Advise

## 2020-01-26 NOTE — Telephone Encounter (Signed)
PDMP okay, Rx sent 

## 2020-01-27 ENCOUNTER — Other Ambulatory Visit (HOSPITAL_BASED_OUTPATIENT_CLINIC_OR_DEPARTMENT_OTHER): Payer: Self-pay | Admitting: Internal Medicine

## 2020-01-27 ENCOUNTER — Telehealth: Payer: Self-pay | Admitting: Internal Medicine

## 2020-01-27 DIAGNOSIS — Z1231 Encounter for screening mammogram for malignant neoplasm of breast: Secondary | ICD-10-CM

## 2020-01-27 NOTE — Telephone Encounter (Signed)
Patient requests to be called at ph# [phone] to be scheduled for her Prolia injection (date must be after 03/10/20). Last injection 07/03/19.

## 2020-01-28 NOTE — Telephone Encounter (Signed)
Will route to Linus Galas to schedule Prolia injection. Thank you!

## 2020-01-29 ENCOUNTER — Other Ambulatory Visit: Payer: Self-pay | Admitting: Internal Medicine

## 2020-01-29 ENCOUNTER — Encounter: Payer: Self-pay | Admitting: Internal Medicine

## 2020-01-29 ENCOUNTER — Ambulatory Visit (INDEPENDENT_AMBULATORY_CARE_PROVIDER_SITE_OTHER): Payer: Medicare HMO | Admitting: Internal Medicine

## 2020-01-29 ENCOUNTER — Other Ambulatory Visit: Payer: Self-pay

## 2020-01-29 VITALS — BP 134/83 | HR 63 | Temp 98.3°F | Resp 18 | Ht 65.0 in | Wt 183.4 lb

## 2020-01-29 DIAGNOSIS — E785 Hyperlipidemia, unspecified: Secondary | ICD-10-CM | POA: Diagnosis not present

## 2020-01-29 DIAGNOSIS — Z09 Encounter for follow-up examination after completed treatment for conditions other than malignant neoplasm: Secondary | ICD-10-CM | POA: Diagnosis not present

## 2020-01-29 DIAGNOSIS — Z79899 Other long term (current) drug therapy: Secondary | ICD-10-CM

## 2020-01-29 DIAGNOSIS — G8929 Other chronic pain: Secondary | ICD-10-CM | POA: Diagnosis not present

## 2020-01-29 DIAGNOSIS — R69 Illness, unspecified: Secondary | ICD-10-CM | POA: Diagnosis not present

## 2020-01-29 DIAGNOSIS — M549 Dorsalgia, unspecified: Secondary | ICD-10-CM

## 2020-01-29 DIAGNOSIS — F419 Anxiety disorder, unspecified: Secondary | ICD-10-CM

## 2020-01-29 LAB — COMPREHENSIVE METABOLIC PANEL
AG Ratio: 1.6 (calc) (ref 1.0–2.5)
ALT: 11 U/L (ref 6–29)
AST: 13 U/L (ref 10–35)
Albumin: 3.9 g/dL (ref 3.6–5.1)
Alkaline phosphatase (APISO): 81 U/L (ref 37–153)
BUN: 12 mg/dL (ref 7–25)
CO2: 28 mmol/L (ref 20–32)
Calcium: 9.4 mg/dL (ref 8.6–10.4)
Chloride: 105 mmol/L (ref 98–110)
Creat: 0.79 mg/dL (ref 0.60–0.93)
Globulin: 2.4 g/dL (calc) (ref 1.9–3.7)
Glucose, Bld: 85 mg/dL (ref 65–99)
Potassium: 4.1 mmol/L (ref 3.5–5.3)
Sodium: 141 mmol/L (ref 135–146)
Total Bilirubin: 0.5 mg/dL (ref 0.2–1.2)
Total Protein: 6.3 g/dL (ref 6.1–8.1)

## 2020-01-29 LAB — LIPID PANEL
Cholesterol: 122 mg/dL (ref <200)
HDL: 51 mg/dL (ref >=50)
LDL Cholesterol (Calc): 52 mg/dL (calc)
Non-HDL Cholesterol (Calc): 71 mg/dL (calc) (ref <130)
Total CHOL/HDL Ratio: 2.4 (calc) (ref <5.0)
Triglycerides: 108 mg/dL (ref <150)

## 2020-01-29 MED ORDER — NALOXONE HCL 0.4 MG/ML IJ SOLN: 0.4000 mg | INTRAMUSCULAR | 1 refills | Status: DC | PRN

## 2020-01-29 NOTE — Progress Notes (Signed)
Subjective:    Patient ID: [name], female    DOB: 1946/03/06, 74 y.o.   MRN: [id number]  DOS:  01/29/2020 Type of visit - description: Routine follow-up Several issues discussed Had back surgery in August, it was very difficult the first few weeks, she is getting somewhat better. Pain management: Discussed   Review of Systems Denies chest pain, difficulty breathing or palpitation  Past Medical History:  Diagnosis Date  . Cataract    both eyes rt. eye was removed  . Chest pain    stress test @ [facility] cardiology (-)  . Colonic polyp    bx adenomatous polyps, next scope 2010  . COPD (chronic obstructive pulmonary disease) ([place])   . Fibromyalgia    see rheumatology  . Glaucoma    not glaucoma surgically removed growths on cornea  . Hypothyroidism   . Migraine   . 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 Osteoporosis    osteopenia  . Pleuritic chest pain    etiology unclear, has beeb eval by pulmonary and rheumatology (autoimmune dz?)  . Wrist fracture 2018    Past Surgical History:  Procedure Laterality Date  . ANTERIOR LAT LUMBAR FUSION N/A 12/03/2019   Procedure: ANTERIOR LATERAL LUMBAR FUSION L2-4;  Surgeon: Melina Schools, MD;  Location: [facility];  Service: Orthopedics;  Laterality: N/A;  4 hrs  . COSMETIC SURGERY  04/12/2018   L upper eyelid ptosis repair, and trichophytic brow lift  . ESOPHAGOGASTRODUODENOSCOPY (EGD) WITH PROPOFOL N/A 03/27/2013   Procedure: ESOPHAGOGASTRODUODENOSCOPY (EGD) WITH PROPOFOL;  Surgeon: Milus Banister, MD;  Location: [facility];  Service: Endoscopy;  Laterality: N/A;  possible dil  . 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  . plastic surgery face  2013  . POLYPECTOMY    . right rotator cuff repair  2010  . TONSILLECTOMY      Allergies as of 01/29/2020      Reactions   Alendronate Sodium Hives   Bactrim [sulfamethoxazole-trimethoprim]    Upset stomach   Contrast Media [iodinated Diagnostic Agents] Hives   Iodine Hives    Milnacipran    REACTION: increased bp, increased hr   Penicillins Hives      Medication List       Accurate as of January 29, 2020 11:59 PM. If you have any questions, ask your nurse or doctor.        Advair Diskus 100-50 MCG/DOSE Aepb Generic drug: Fluticasone-Salmeterol Inhale 1 puff into the lungs in the morning and at bedtime.   ALPRAZolam 0.5 MG tablet Commonly known as: XANAX TAKE 1 TABLET BY MOUTH AT BEDTIME AS NEEDED FOR ANXIETY.   atorvastatin 10 MG tablet Commonly known as: LIPITOR Take 1 tablet (10 mg total) by mouth at bedtime.   calcium carbonate 1250 (500 Ca) MG chewable tablet Commonly known as: OS-CAL Chew 1 tablet by mouth 2 (two) times daily.   celecoxib 200 MG capsule Commonly known as: CELEBREX Take 200 mg by mouth in the morning and at bedtime.   denosumab 60 MG/ML Sosy injection Commonly known as: PROLIA Inject 60 mg into the skin every 6 (six) months.   HYDROcodone-acetaminophen 7.5-325 MG tablet Commonly known as: NORCO Take 1 tablet by mouth every 6 (six) hours as needed for moderate pain.   hydroxypropyl methylcellulose / hypromellose 2.5 % ophthalmic solution Commonly known as: ISOPTO TEARS / GONIOVISC Place 1 drop into both eyes 3 (three) times daily as needed for dry eyes.  levothyroxine 88 MCG tablet Commonly known as: SYNTHROID Take 1 tablet (88 mcg total) by mouth daily before breakfast.   methocarbamol 500 MG tablet Commonly known as: ROBAXIN Take 500 mg by mouth 2 (two) times daily.   multivitamin capsule Take 1 capsule by mouth daily.   Narcan 4 MG/0.1ML Liqd nasal spray kit Generic drug: naloxone Use as directed Started by: Kathlene November, MD   ondansetron 4 MG tablet Commonly known as: Zofran Take 1 tablet (4 mg total) by mouth every 8 (eight) hours as needed for nausea or vomiting.   Probiotic-10 Chew Chew 1 tablet by mouth daily.   vitamin B-12 1000 MCG tablet Commonly known as: CYANOCOBALAMIN Take 1,000 mcg by  mouth daily.   Vitamin D3 50 MCG (2000 UT) Chew Chew 2,000 Units by mouth daily.          Objective:   Physical Exam BP 134/83 (BP Location: Left Arm, Patient Position: Sitting, Cuff Size: Small)   Pulse 63   Temp 98.3 F (36.8 C) (Oral)   Resp 18   Ht '5\' 5"'  (1.651 m)   Wt 183 lb 6 oz (83.2 kg)   LMP 04/10/1998 (Approximate)   SpO2 97%   BMI 30.52 kg/m   General:   Well developed, NAD, BMI noted. HEENT:  Normocephalic . Face symmetric, atraumatic Lungs:  CTA B Normal respiratory effort, no intercostal retractions, no accessory muscle use. Heart: RRR,  no murmur.  Lower extremities: no pretibial edema bilaterally  Skin: Not pale. Not jaundice Neurologic:  alert & oriented X3.  Speech normal, gait appropriate for age and unassisted Psych--  Cognition and judgment appear intact.  Cooperative with normal attention span and concentration.  Behavior appropriate. No anxious or depressed appearing.      Assessment      Assessment Hypothyroidism Hyperlipidemia: Started meds 09/2019 GERD Migraines insmonia-- xanax prn COPD: per chest CTs, former heavy smoker, quit 2005 [facility]- Pain mngmt  -- back pain, 2017 had a MRI, saw Dr Durward Fortes, Rx local injection, PT --History of fibromyalgia --H/o Pleuritic chest pain, on-off, resolved  --spinal stenosis (MRI 05/2018)  Osteopenia:  Per ENDO --T score 2011   -2.6 (intolerant to fosamax, ok w/ Boniva x years) --T score 01-2013   -2.6 --T score 02-2015  -2.3, rx ca and cit D  Glaucoma Ocular Rosacea 2010 CV: LAD  artery disease  per CT scan of the chest, CP:  admitted, stress lest low risk 05-2015.  Control CV RF CAD, aortic sclerosis: Per CT  PLAN: Hyperlipidemia: Based on last FLP, Lipitor started, good compliance, no apparent side effects, check FLP and CMP. CAD, aortic sclerosis: Recently started atorvastatin, recommend to stay on aspirin Pain management: Had back surgery August 2021, it was very difficult at first, she  is gradually slightly better.  Currently on hydrocodone, Celebrex (is helping significantly), Robaxin and also Xanax.  Advised patient to have Narcan ready, Rx sent, explaining to use it if she (or her family) suspect OD: Excessive somnolence, MS changes etc. UDS and contract today GI precautions for Celebrex also discussed. RTC 4 months CPX   This visit occurred during the SARS-CoV-2 public health emergency.  Safety protocols were in place, including screening questions prior to the visit, additional usage of staff PPE, and extensive cleaning of exam room while observing appropriate contact time as indicated for disinfecting solutions.

## 2020-01-29 NOTE — Patient Instructions (Addendum)
Use Narcan as needed  GO TO THE LAB : Get the blood work     [place] Come back for   a physical exam in 4 months   Naloxone injection What is this medicine? NALOXONE (nal OX one) is a narcotic blocker. It is used to treat narcotic (opioid) drug overdose. It is used to temporarily reverse the effects of opioid medicines. This medicine has no effect in people who are not taking opioid medicines. This medicine may be used for other purposes; ask your health care provider or pharmacist if you have questions. COMMON BRAND NAME(S): EVZIO, Narcan What should I tell my health care provider before I take this medicine? They need to know if you have any of these conditions:  drug abuse or addiction  heart disease  an unusual or allergic reaction to naloxone, other medicines, foods, dyes, or preservatives  pregnant or trying to get pregnantbreast-feeding How should I use this medicine? This medicine may be administered in a hospital, clinic, or can be used by the public to give aid to a person who has overdosed until emergency medical help is available. This medicine is for injection into the outer thigh. It can be injected through clothing if needed. Get emergency medical help right away after giving the first dose of this medicine, even if the person wakes up. You should be familiar with how to recognize the signs and symptoms of a narcotic overdose. Administer according to the printed instructions on the device label or the electronic voice instructions. You should practice using the Trainer injector before this medicine is needed. Talk to your pediatrician regarding the use of this medicine in children. While this drug may be prescribed for children as young as newborn for selected conditions, precautions do apply. For infants less than 1 year of age, pinch the thigh muscle while administering. Overdosage: If you think you have taken too much of  this medicine contact a poison control center or emergency room at once. NOTE: This medicine is only for you. Do not share this medicine with others. What if I miss a dose? This does not apply. What may interact with this medicine? This medicine is only used during an emergency. No interactions are expected during emergency use. This list may not describe all possible interactions. Give your health care provider a list of all the medicines, herbs, non-prescription drugs, or dietary supplements you use. Also tell them if you smoke, drink alcohol, or use illegal drugs. Some items may interact with your medicine. What should I watch for while using this medicine? Keep this medicine ready for use in the case of a narcotic overdose. Make sure that you have the phone number of your doctor or health care professional and local hospital ready. You may need to have additional doses of this medicine. Each injector contains a single dose. Some emergencies may require additional doses. After use, bring the treated person to the nearest hospital or call 911. Make sure the treating health care professional knows that the person has received an injection of this medicine. You will receive additional instructions on what to do during and after use of this medicine before an emergency occurs. What side effects may I notice from receiving this medicine? Side effects that you should report to your doctor or health care professional as soon as possible:  allergic reactions like skin rash, itching or hives, swelling of the face, lips, or tongue  breathing problems  fast, irregular heartbeat  high blood pressure  pain that was controlled by narcotic pain medicine  seizures Side effects that usually do not require medical attention (report to your doctor or health care professional if they continue or are bothersome):  anxious  chills  diarrhea  fever  nausea, vomiting  sweating This list may not  describe all possible side effects. Call your doctor for medical advice about side effects. You may report side effects to FDA at 1-800-FDA-1088. Where should I keep my medicine? Keep out of the reach of children. Store at room temperature between 15 and 25 degrees C (59 and 77 degrees F). If you are using this medicine at home, you will be instructed on how to store this medicine. Keep this medicine in its outer case until ready to use. Occasionally check the solution through the viewing window of the injector. The solution should be clear. If it is discolored, cloudy, or contains solid particles, replace it with a new injector. Remember to check the expiration date of this medicine regularly. Throw away any unused medicine after the expiration date. NOTE: This sheet is a summary. It may not cover all possible information. If you have questions about this medicine, talk to your doctor, pharmacist, or health care provider.  2020 Elsevier/Gold Standard (2015-04-06 16:45:38)

## 2020-01-29 NOTE — Progress Notes (Signed)
Pre visit review using our clinic review tool, if applicable. No additional management support is needed unless otherwise documented below in the visit note. 

## 2020-01-30 DIAGNOSIS — M5416 Radiculopathy, lumbar region: Secondary | ICD-10-CM | POA: Diagnosis not present

## 2020-01-30 NOTE — Assessment & Plan Note (Signed)
Hyperlipidemia: Based on last FLP, Lipitor started, good compliance, no apparent side effects, check FLP and CMP. CAD, aortic sclerosis: Recently started atorvastatin, recommend to stay on aspirin Pain management: Had back surgery August 2021, it was very difficult at first, she is gradually slightly better.  Currently on hydrocodone, Celebrex (is helping significantly), Robaxin and also Xanax.  Advised patient to have Narcan ready, Rx sent, explaining to use it if she (or her family) suspect OD: Excessive somnolence, MS changes etc. UDS and contract today GI precautions for Celebrex also discussed. RTC 4 months CPX

## 2020-01-30 NOTE — Telephone Encounter (Signed)
Hi Almyra Free, I am not sure wht is going on with this in so I am calling insurance-looks like she gets it through pharmacy benefit though-I will get back to you

## 2020-02-02 MED ORDER — ATORVASTATIN CALCIUM 10 MG PO TABS
10.0000 mg | ORAL_TABLET | Freq: Every day | ORAL | 3 refills | Status: DC
Start: 2020-02-02 — End: 2021-03-24

## 2020-02-02 NOTE — Addendum Note (Signed)
Addended byDamita Dunnings D on: 02/02/2020 08:28 AM   Modules accepted: Orders

## 2020-02-03 LAB — DRUG MONITORING, PANEL 8 WITH CONFIRMATION, URINE
6 Acetylmorphine: NEGATIVE ng/mL (ref <10)
Alcohol Metabolites: NEGATIVE ng/mL
Alphahydroxyalprazolam: 158 ng/mL — ABNORMAL HIGH (ref <25)
Alphahydroxymidazolam: NEGATIVE ng/mL (ref <50)
Alphahydroxytriazolam: NEGATIVE ng/mL (ref <50)
Aminoclonazepam: NEGATIVE ng/mL (ref <25)
Amphetamines: NEGATIVE ng/mL (ref <500)
Benzodiazepines: POSITIVE ng/mL — AB (ref <100)
Buprenorphine, Urine: NEGATIVE ng/mL (ref <5)
Cocaine Metabolite: NEGATIVE ng/mL (ref <150)
Codeine: NEGATIVE ng/mL (ref <50)
Creatinine: 78.6 mg/dL
Hydrocodone: 354 ng/mL — ABNORMAL HIGH (ref <50)
Hydromorphone: 316 ng/mL — ABNORMAL HIGH (ref <50)
Hydroxyethylflurazepam: NEGATIVE ng/mL (ref <50)
Lorazepam: NEGATIVE ng/mL (ref <50)
MDMA: NEGATIVE ng/mL (ref <500)
Marijuana Metabolite: NEGATIVE ng/mL (ref <20)
Morphine: NEGATIVE ng/mL (ref <50)
Nordiazepam: NEGATIVE ng/mL (ref <50)
Norhydrocodone: 1820 ng/mL — ABNORMAL HIGH (ref <50)
Opiates: POSITIVE ng/mL — AB (ref <100)
Oxazepam: NEGATIVE ng/mL (ref <50)
Oxidant: NEGATIVE ug/mL
Oxycodone: NEGATIVE ng/mL (ref <100)
Temazepam: NEGATIVE ng/mL (ref <50)
pH: 6.8 (ref 4.5–9.0)

## 2020-02-03 LAB — DM TEMPLATE

## 2020-02-09 DIAGNOSIS — M5416 Radiculopathy, lumbar region: Secondary | ICD-10-CM | POA: Diagnosis not present

## 2020-02-12 DIAGNOSIS — M5416 Radiculopathy, lumbar region: Secondary | ICD-10-CM | POA: Diagnosis not present

## 2020-02-16 DIAGNOSIS — M5416 Radiculopathy, lumbar region: Secondary | ICD-10-CM | POA: Diagnosis not present

## 2020-02-19 DIAGNOSIS — M5416 Radiculopathy, lumbar region: Secondary | ICD-10-CM | POA: Diagnosis not present

## 2020-02-23 DIAGNOSIS — M5416 Radiculopathy, lumbar region: Secondary | ICD-10-CM | POA: Diagnosis not present

## 2020-02-24 ENCOUNTER — Telehealth: Payer: Self-pay | Admitting: Internal Medicine

## 2020-02-25 MED ORDER — HYDROCODONE-ACETAMINOPHEN 7.5-325 MG PO TABS: 1.0000 | ORAL_TABLET | Freq: Four times a day (QID) | ORAL | 0 refills | Status: DC | PRN

## 2020-02-25 NOTE — Telephone Encounter (Signed)
Last written: 01/26/20 Last ov: 01/29/20 Next ov: 06/01/20 Contract:  UDS: 01/29/20

## 2020-02-25 NOTE — Telephone Encounter (Signed)
PDMP okay, prescription sent 

## 2020-02-27 DIAGNOSIS — M5416 Radiculopathy, lumbar region: Secondary | ICD-10-CM | POA: Diagnosis not present

## 2020-03-01 DIAGNOSIS — Z4889 Encounter for other specified surgical aftercare: Secondary | ICD-10-CM | POA: Diagnosis not present

## 2020-03-03 DIAGNOSIS — M5416 Radiculopathy, lumbar region: Secondary | ICD-10-CM | POA: Diagnosis not present

## 2020-03-08 DIAGNOSIS — M5416 Radiculopathy, lumbar region: Secondary | ICD-10-CM | POA: Diagnosis not present

## 2020-03-10 ENCOUNTER — Ambulatory Visit: Payer: Medicare HMO | Admitting: Obstetrics and Gynecology

## 2020-03-11 ENCOUNTER — Other Ambulatory Visit: Payer: Self-pay

## 2020-03-11 ENCOUNTER — Ambulatory Visit (INDEPENDENT_AMBULATORY_CARE_PROVIDER_SITE_OTHER): Payer: Medicare HMO

## 2020-03-11 DIAGNOSIS — M810 Age-related osteoporosis without current pathological fracture: Secondary | ICD-10-CM

## 2020-03-11 MED ORDER — DENOSUMAB 60 MG/ML ~~LOC~~ SOSY
60.0000 mg | PREFILLED_SYRINGE | Freq: Once | SUBCUTANEOUS | Status: AC
  Administered 2020-03-11: 60 mg via SUBCUTANEOUS

## 2020-03-11 NOTE — Progress Notes (Signed)
Administered Prolia. Patient tolerated well.

## 2020-03-15 DIAGNOSIS — M5416 Radiculopathy, lumbar region: Secondary | ICD-10-CM | POA: Diagnosis not present

## 2020-03-18 NOTE — Telephone Encounter (Signed)
Pt had Prolia on 03/11/20

## 2020-03-19 DIAGNOSIS — M5416 Radiculopathy, lumbar region: Secondary | ICD-10-CM | POA: Diagnosis not present

## 2020-03-22 ENCOUNTER — Other Ambulatory Visit: Payer: Self-pay

## 2020-03-22 ENCOUNTER — Encounter (HOSPITAL_BASED_OUTPATIENT_CLINIC_OR_DEPARTMENT_OTHER): Payer: Self-pay

## 2020-03-22 ENCOUNTER — Ambulatory Visit (HOSPITAL_BASED_OUTPATIENT_CLINIC_OR_DEPARTMENT_OTHER)
Admission: RE | Admit: 2020-03-22 | Discharge: 2020-03-22 | Disposition: A | Discharge status: Home / Self Care | Payer: Medicare HMO | Source: Ambulatory Visit | Attending: Internal Medicine | Admitting: Internal Medicine

## 2020-03-22 DIAGNOSIS — Z1231 Encounter for screening mammogram for malignant neoplasm of breast: Secondary | ICD-10-CM | POA: Diagnosis not present

## 2020-03-23 DIAGNOSIS — M5416 Radiculopathy, lumbar region: Secondary | ICD-10-CM | POA: Diagnosis not present

## 2020-03-25 DIAGNOSIS — M5416 Radiculopathy, lumbar region: Secondary | ICD-10-CM | POA: Diagnosis not present

## 2020-03-25 DIAGNOSIS — R69 Illness, unspecified: Secondary | ICD-10-CM | POA: Diagnosis not present

## 2020-03-26 ENCOUNTER — Other Ambulatory Visit: Payer: Self-pay | Admitting: Internal Medicine

## 2020-03-26 MED ORDER — HYDROCODONE-ACETAMINOPHEN 7.5-325 MG PO TABS: 1.0000 | ORAL_TABLET | Freq: Four times a day (QID) | ORAL | 0 refills | Status: DC | PRN

## 2020-03-26 NOTE — Telephone Encounter (Signed)
Requesting: hydrocodone 7.5-325mg  Contract: 01/29/2020 UDS: 01/29/2020 Last Visit:  01/29/2020 Next Visit: 06/01/2020 Last Refill: 02/25/2020 #120 and 0RF Pt sig: 1 tab q6h prn  Will you refill in PCP absence?   Please Advise

## 2020-03-29 DIAGNOSIS — M5416 Radiculopathy, lumbar region: Secondary | ICD-10-CM | POA: Diagnosis not present

## 2020-04-06 DIAGNOSIS — M5416 Radiculopathy, lumbar region: Secondary | ICD-10-CM | POA: Diagnosis not present

## 2020-04-10 ENCOUNTER — Other Ambulatory Visit: Payer: Self-pay | Admitting: Internal Medicine

## 2020-04-22 ENCOUNTER — Telehealth: Payer: Self-pay

## 2020-04-23 DIAGNOSIS — I2510 Atherosclerotic heart disease of native coronary artery without angina pectoris: Secondary | ICD-10-CM | POA: Diagnosis not present

## 2020-04-23 DIAGNOSIS — M4800 Spinal stenosis, site unspecified: Secondary | ICD-10-CM | POA: Diagnosis not present

## 2020-04-23 DIAGNOSIS — J439 Emphysema, unspecified: Secondary | ICD-10-CM | POA: Diagnosis not present

## 2020-04-23 DIAGNOSIS — E669 Obesity, unspecified: Secondary | ICD-10-CM | POA: Diagnosis not present

## 2020-04-23 DIAGNOSIS — G8929 Other chronic pain: Secondary | ICD-10-CM | POA: Diagnosis not present

## 2020-04-23 DIAGNOSIS — E039 Hypothyroidism, unspecified: Secondary | ICD-10-CM | POA: Diagnosis not present

## 2020-04-23 DIAGNOSIS — R69 Illness, unspecified: Secondary | ICD-10-CM | POA: Diagnosis not present

## 2020-04-23 DIAGNOSIS — K5900 Constipation, unspecified: Secondary | ICD-10-CM | POA: Diagnosis not present

## 2020-04-23 DIAGNOSIS — E785 Hyperlipidemia, unspecified: Secondary | ICD-10-CM | POA: Diagnosis not present

## 2020-04-23 DIAGNOSIS — M810 Age-related osteoporosis without current pathological fracture: Secondary | ICD-10-CM | POA: Diagnosis not present

## 2020-04-23 DIAGNOSIS — Z008 Encounter for other general examination: Secondary | ICD-10-CM | POA: Diagnosis not present

## 2020-04-23 MED ORDER — HYDROCODONE-ACETAMINOPHEN 7.5-325 MG PO TABS: 1.0000 | ORAL_TABLET | Freq: Four times a day (QID) | ORAL | 0 refills | Status: DC | PRN

## 2020-04-23 NOTE — Telephone Encounter (Signed)
Requesting: hydrocodone 7.5-325mg  Contract: 01/29/2020 UDS: 01/29/2020 Last Visit: 01/29/2020 Next Visit: 06/01/2020 Last Refill: 03/26/2020 #120 and 0RF Pt sig: 1 tab q6h prn  Please Advise

## 2020-04-23 NOTE — Telephone Encounter (Signed)
PDMP okay, has a prescription for Narcan,  RF sent

## 2020-05-12 ENCOUNTER — Other Ambulatory Visit: Payer: Self-pay | Admitting: *Deleted

## 2020-05-12 MED ORDER — ESTRADIOL 10 MCG VA TABS: 1.0000 | ORAL_TABLET | VAGINAL | 1 refills | Status: DC

## 2020-05-12 NOTE — Telephone Encounter (Signed)
Patient annual exam scheduled on 07/20/20, called requesting refill on Yuvfem. I left message for patient to call.

## 2020-05-12 NOTE — Telephone Encounter (Signed)
Patient called back annual exam scheduled on 07/20/20, asked if Vagifem 10 mcg tablet can be refilled. Patient said Rx has expired. Patient asked if only a 30 day supply could be sent verses 90 day supply due to cost being very expensive with 3 month supply. Patient report she had back surgery in the winter and this is why annual exam was not done last year. Okay sent Rx?

## 2020-05-24 ENCOUNTER — Telehealth: Payer: Self-pay | Admitting: Internal Medicine

## 2020-05-24 DIAGNOSIS — M5459 Other low back pain: Secondary | ICD-10-CM | POA: Diagnosis not present

## 2020-05-24 MED ORDER — HYDROCODONE-ACETAMINOPHEN 7.5-325 MG PO TABS
1.0000 | ORAL_TABLET | Freq: Four times a day (QID) | ORAL | 0 refills | Status: DC | PRN
Start: 2020-05-24 — End: 2020-06-21

## 2020-05-24 NOTE — Telephone Encounter (Signed)
PDMP okay, Rx sent 

## 2020-05-24 NOTE — Telephone Encounter (Signed)
Requesting: hydrocodone 7.5-325mg  Contract: 01/29/2020 UDS: 01/29/2020 Last Visit: 01/29/2020 Next Visit: 06/01/2020 Last Refill: 04/23/2020 #120 and 0RF Pt sig: 1 tab q6h prn  Please Advise

## 2020-05-26 ENCOUNTER — Telehealth: Payer: Self-pay | Admitting: Internal Medicine

## 2020-05-26 NOTE — Telephone Encounter (Signed)
Requesting: alprazolam 0.5mg  Contract: 01/29/2020 UDS: 01/29/2020 Last Visit: 01/29/2020 Next Visit: 06/01/2020 Last Refill: 11/18/2019 #30 and 5RF  Please Advise

## 2020-05-26 NOTE — Telephone Encounter (Signed)
PDMP okay, prescription sent 

## 2020-06-01 ENCOUNTER — Ambulatory Visit (INDEPENDENT_AMBULATORY_CARE_PROVIDER_SITE_OTHER): Payer: Medicare HMO | Admitting: Internal Medicine

## 2020-06-01 ENCOUNTER — Encounter: Payer: Self-pay | Admitting: Internal Medicine

## 2020-06-01 ENCOUNTER — Other Ambulatory Visit: Payer: Self-pay

## 2020-06-01 VITALS — BP 124/88 | HR 65 | Temp 98.4°F | Ht 65.0 in | Wt 189.8 lb

## 2020-06-01 DIAGNOSIS — M549 Dorsalgia, unspecified: Secondary | ICD-10-CM | POA: Diagnosis not present

## 2020-06-01 DIAGNOSIS — I2583 Coronary atherosclerosis due to lipid rich plaque: Secondary | ICD-10-CM | POA: Diagnosis not present

## 2020-06-01 DIAGNOSIS — E039 Hypothyroidism, unspecified: Secondary | ICD-10-CM | POA: Diagnosis not present

## 2020-06-01 DIAGNOSIS — I2510 Atherosclerotic heart disease of native coronary artery without angina pectoris: Secondary | ICD-10-CM | POA: Diagnosis not present

## 2020-06-01 DIAGNOSIS — Z0001 Encounter for general adult medical examination with abnormal findings: Secondary | ICD-10-CM | POA: Diagnosis not present

## 2020-06-01 DIAGNOSIS — E785 Hyperlipidemia, unspecified: Secondary | ICD-10-CM | POA: Diagnosis not present

## 2020-06-01 DIAGNOSIS — Z122 Encounter for screening for malignant neoplasm of respiratory organs: Secondary | ICD-10-CM

## 2020-06-01 DIAGNOSIS — G8929 Other chronic pain: Secondary | ICD-10-CM

## 2020-06-01 DIAGNOSIS — Z0000 Encounter for general adult medical examination without abnormal findings: Secondary | ICD-10-CM

## 2020-06-01 DIAGNOSIS — Z1211 Encounter for screening for malignant neoplasm of colon: Secondary | ICD-10-CM

## 2020-06-01 LAB — CBC WITH DIFFERENTIAL/PLATELET
Basophils Absolute: 0 10*3/uL (ref 0.0–0.1)
Basophils Relative: 0.6 % (ref 0.0–3.0)
Eosinophils Absolute: 0.1 10*3/uL (ref 0.0–0.7)
Eosinophils Relative: 2.2 % (ref 0.0–5.0)
HCT: 39.1 % (ref 36.0–46.0)
Hemoglobin: 12.8 g/dL (ref 12.0–15.0)
Lymphocytes Relative: 26.1 % (ref 12.0–46.0)
Lymphs Abs: 1.7 10*3/uL (ref 0.7–4.0)
MCHC: 32.8 g/dL (ref 30.0–36.0)
MCV: 92.8 fl (ref 78.0–100.0)
Monocytes Absolute: 0.7 10*3/uL (ref 0.1–1.0)
Monocytes Relative: 10 % (ref 3.0–12.0)
Neutro Abs: 4.1 10*3/uL (ref 1.4–7.7)
Neutrophils Relative %: 61.1 % (ref 43.0–77.0)
Platelets: 239 10*3/uL (ref 150.0–400.0)
RBC: 4.21 Mil/uL (ref 3.87–5.11)
RDW: 14.4 % (ref 11.5–15.5)
WBC: 6.7 10*3/uL (ref 4.0–10.5)

## 2020-06-01 LAB — BASIC METABOLIC PANEL
BUN: 15 mg/dL (ref 6–23)
CO2: 31 mEq/L (ref 19–32)
Calcium: 8.7 mg/dL (ref 8.4–10.5)
Chloride: 103 mEq/L (ref 96–112)
Creatinine, Ser: 0.74 mg/dL (ref 0.40–1.20)
GFR: 79.79 mL/min (ref >60.00)
Glucose, Bld: 67 mg/dL — ABNORMAL LOW (ref 70–99)
Potassium: 3.9 mEq/L (ref 3.5–5.1)
Sodium: 141 mEq/L (ref 135–145)

## 2020-06-01 LAB — TSH: TSH: 1.75 u[IU]/mL (ref 0.35–4.50)

## 2020-06-01 NOTE — Patient Instructions (Addendum)
Decrease Celebrex 200 mg 1 tablet daily In few weeks call me, if you are still doing well we can decrease it to Celebrex 100 mg 1 tablet daily.  Check the  blood pressure  BP GOAL is between 110/65 and  135/85. If it is consistently higher or lower, let me know   GO TO THE LAB : Get the blood work     [place], [place] back for   a checkup in 3 to 4 months     Advance Directive  Advance directives are legal documents that allow you to make decisions about your health care and medical treatment in case you become unable to communicate for yourself. Advance directives let your wishes be known to family, friends, and health care providers. Discussing and writing advance directives should happen over time rather than all at once. Advance directives can be changed and updated at any time. There are different types of advance directives, such as:  Medical power of attorney.  Living will.  Do not resuscitate (DNR) order or do not attempt resuscitation (DNAR) order. Health care proxy and medical power of attorney A health care proxy is also called a health care agent. This person is appointed to make medical decisions for you when you are unable to make decisions for yourself. Generally, people ask a trusted friend or family member to act as their proxy and represent their preferences. Make sure you have an agreement with your trusted person to act as your proxy. A proxy may have to make a medical decision on your behalf if your wishes are not known. A medical power of attorney, also called a durable power of attorney for health care, is a legal document that names your health care proxy. Depending on the laws in your state, the document may need to be:  Signed.  Notarized.  Dated.  Copied.  Witnessed.  Incorporated into your medical record. You may also want to appoint a trusted person to manage your money in the event you are unable to do  so. This is called a durable power of attorney for finances. It is a separate legal document from the durable power of attorney for health care. You may choose your health care proxy or someone different to act as your agent in money matters. If you do not appoint a proxy, or there is a concern that the proxy is not acting in your best interest, a court may appoint a guardian to act on your behalf. Living will A living will is a set of instructions that state your wishes about medical care when you cannot express them yourself. Health care providers should keep a copy of your living will in your medical record. You may want to give a copy to family members or friends. To alert caregivers in case of an emergency, you can place a card in your wallet to let them know that you have a living will and where they can find it. A living will is used if you become:  Terminally ill.  Disabled.  Unable to communicate or make decisions. The following decisions should be included in your living will:  To use or not to use life support equipment, such as dialysis machines and breathing machines (ventilators).  Whether you want a DNR or DNAR order. This tells health care providers not to use cardiopulmonary resuscitation (CPR) if breathing or heartbeat stops.  To use or not to use tube feeding.  To  be given or not to be given food and fluids.  Whether you want comfort (palliative) care when the goal becomes comfort rather than a cure.  Whether you want to donate your organs and tissues. A living will does not give instructions for distributing your money and property if you should pass away. DNR or DNAR A DNR or DNAR order is a request not to have CPR in the event that your heart stops beating or you stop breathing. If a DNR or DNAR order has not been made and shared, a health care provider will try to help any patient whose heart has stopped or who has stopped breathing. If you plan to have surgery, talk  with your health care provider about how your DNR or DNAR order will be followed if problems occur. What if I do not have an advance directive? Some states assign family decision makers to act on your behalf if you do not have an advance directive. Each state has its own laws about advance directives. You may want to check with your health care provider, attorney, or state representative about the laws in your state. Summary  Advance directives are legal documents that allow you to make decisions about your health care and medical treatment in case you become unable to communicate for yourself.  The process of discussing and writing advance directives should happen over time. You can change and update advance directives at any time.  Advance directives may include a medical power of attorney, a living will, and a DNR or DNAR order. This information is not intended to replace advice given to you by your health care provider. Make sure you discuss any questions you have with your health care provider. Document Revised: 12/30/2019 Document Reviewed: 12/30/2019 Elsevier Patient Education  2021 [employer].

## 2020-06-01 NOTE — Progress Notes (Signed)
Subjective:    Patient ID: [name], female    DOB: 27-Dec-1945, 75 y.o.   MRN: [id number]  DOS:  06/01/2020 Type of visit - description: Here for CPX  Since the last office visit, continue with pain at her back. She is taking high doses of Celebrex without GI side effects. Other than the pain she feels well.  Wt Readings from Last 3 Encounters:  06/01/20 189 lb 12.8 oz (86.1 kg)  01/29/20 183 lb 6 oz (83.2 kg)  12/03/19 182 lb (82.6 kg)     Review of Systems  Other than above, a 14 point review of systems is negative      Past Medical History:  Diagnosis Date  . Cataract    both eyes rt. eye was removed  . Chest pain    stress test @ [facility] cardiology (-)  . Colonic polyp    bx adenomatous polyps, next scope 2010  . COPD (chronic obstructive pulmonary disease) (HCC)   . Fibromyalgia    see rheumatology  . Glaucoma    not glaucoma surgically removed growths on cornea  . Hypothyroidism   . Migraine   . 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 Osteoporosis    osteopenia  . Pleuritic chest pain    etiology unclear, has beeb eval by pulmonary and rheumatology (autoimmune dz?)  . Wrist fracture 2018    Past Surgical History:  Procedure Laterality Date  . ANTERIOR LAT LUMBAR FUSION N/A 12/03/2019   Procedure: ANTERIOR LATERAL LUMBAR FUSION L2-4;  Surgeon: Venita Lick, MD;  Location: [facility] OR;  Service: Orthopedics;  Laterality: N/A;  4 hrs  . COSMETIC SURGERY  04/12/2018   L upper eyelid ptosis repair, and trichophytic brow lift  . ESOPHAGOGASTRODUODENOSCOPY (EGD) WITH PROPOFOL N/A 03/27/2013   Procedure: ESOPHAGOGASTRODUODENOSCOPY (EGD) WITH PROPOFOL;  Surgeon: Rachael Fee, MD;  Location: [facility];  Service: Endoscopy;  Laterality: N/A;  possible dil  . 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  . plastic surgery face  2013  . POLYPECTOMY    . right rotator cuff repair  2010  . TONSILLECTOMY      Allergies as of 06/01/2020      Reactions    Alendronate Sodium Hives   Bactrim [sulfamethoxazole-trimethoprim]    Upset stomach   Contrast Media [iodinated Diagnostic Agents] Hives   Iodine Hives   Milnacipran    REACTION: increased bp, increased hr   Penicillins Hives      Medication List       Accurate as of June 01, 2020 11:59 PM. If you have any questions, ask your nurse or doctor.        STOP taking these medications   methocarbamol 500 MG tablet Commonly known as: ROBAXIN Stopped by: Willow Ora, MD     TAKE these medications   Advair Diskus 100-50 MCG/DOSE Aepb Generic drug: Fluticasone-Salmeterol Inhale 1 puff into the lungs in the morning and at bedtime.   ALPRAZolam 0.5 MG tablet Commonly known as: XANAX TAKE 1 TABLET BY MOUTH AT BEDTIME AS NEEDED FOR ANXIETY   atorvastatin 10 MG tablet Commonly known as: LIPITOR Take 1 tablet (10 mg total) by mouth at bedtime.   calcium carbonate 1250 (500 Ca) MG chewable tablet Commonly known as: OS-CAL Chew 1 tablet by mouth 2 (two) times daily.   celecoxib 200 MG capsule Commonly known as: CELEBREX Take 200 mg by mouth daily.   denosumab 60 MG/ML Sosy injection Commonly known  as: PROLIA Inject 60 mg into the skin every 6 (six) months.   Estradiol 10 MCG Tabs vaginal tablet Place 1 tablet (10 mcg total) vaginally 2 (two) times a week.   HYDROcodone-acetaminophen 7.5-325 MG tablet Commonly known as: NORCO Take 1 tablet by mouth every 6 (six) hours as needed for moderate pain.   hydroxypropyl methylcellulose / hypromellose 2.5 % ophthalmic solution Commonly known as: ISOPTO TEARS / GONIOVISC Place 1 drop into both eyes 3 (three) times daily as needed for dry eyes.   levothyroxine 88 MCG tablet Commonly known as: SYNTHROID Take 1 tablet (88 mcg total) by mouth daily before breakfast.   multivitamin capsule Take 1 capsule by mouth daily.   Narcan 4 MG/0.1ML Liqd nasal spray kit Generic drug: naloxone Use as directed   ondansetron 4 MG  tablet Commonly known as: Zofran Take 1 tablet (4 mg total) by mouth every 8 (eight) hours as needed for nausea or vomiting.   Probiotic-10 Chew Chew 1 tablet by mouth daily.   vitamin B-12 1000 MCG tablet Commonly known as: CYANOCOBALAMIN Take 1,000 mcg by mouth daily.   Vitamin D3 50 MCG (2000 UT) Chew Chew 2,000 Units by mouth daily.          Objective:   Physical Exam BP 124/88 (BP Location: Left Arm, Patient Position: Sitting, Cuff Size: Large)   Pulse 65   Temp 98.4 F (36.9 C) (Oral)   Ht 5\' 5"  (1.651 m)   Wt 189 lb 12.8 oz (86.1 kg)   LMP 04/10/1998 (Approximate)   SpO2 99%   BMI 31.58 kg/m  General: Well developed, NAD, BMI noted Neck: No  thyromegaly  HEENT:  Normocephalic . Face symmetric, atraumatic Lungs:  CTA B Normal respiratory effort, no intercostal retractions, no accessory muscle use. Heart: RRR,  no murmur.  Abdomen:  Not distended, soft, non-tender. No rebound or rigidity.  Palpable nontender aorta noted.  Not a new finding Lower extremities: no pretibial edema bilaterally  Skin: Exposed areas without rash. Not pale. Not jaundice Neurologic:  alert & oriented X3.  Speech normal, gait and transferring somewhat limited by pain Strength symmetric and appropriate for age.  Psych: Cognition and judgment appear intact.  Cooperative with normal attention span and concentration.  Behavior appropriate. No anxious or depressed appearing.     Assessment      Assessment Hypothyroidism Hyperlipidemia: Started meds 09/2019 GERD Migraines insmonia-- xanax prn COPD: per chest CTs, former heavy smoker, quit 2005 [facility]- Pain mngmt  -- back pain, 2017 had a MRI, saw Dr Cleophas Dunker, Rx local injection, PT --History of fibromyalgia --H/o Pleuritic chest pain, on-off, resolved  --spinal stenosis (MRI 05/2018)  Osteopenia:  Per ENDO --T score 2011   -2.6 (intolerant to fosamax, ok w/ Boniva x years) --T score 01-2013   -2.6 --T score 02-2015  -2.3, rx ca  and cit D  Glaucoma Ocular Rosacea 2010 CV: LAD  artery disease  per CT scan of the chest, CP:  admitted, stress lest low risk 05-2015.  Control CV RF CAD, aortic sclerosis: Per CT  PLAN: Here for CPX Hypothyroidism: Checking a TSH. Hyperlipidemia: Well-controlled, continue Lipitor. Pain management: Currently on hydrocodone 4 tablets daily, Celebrex 200 mg twice daily.  No GI side effects, likes to cut down on Celebrex and I agree. Will decrease to 1 tablet daily on, she will call in 1 month and consider going to 100 mg daily. CAD, aortic sclerosis per CT: Asymptomatic, controlling CV RF. RTC 3 to 4 months  In addition to CPX, we addressed chronic medical issues including pain management. This visit occurred during the SARS-CoV-2 public health emergency.  Safety protocols were in place, including screening questions prior to the visit, additional usage of staff PPE, and extensive cleaning of exam room while observing appropriate contact time as indicated for disinfecting solutions.

## 2020-06-02 ENCOUNTER — Encounter: Payer: Self-pay | Admitting: Internal Medicine

## 2020-06-02 NOTE — Assessment & Plan Note (Signed)
--  Td 08-2015; Pneumonia shot 2006 and 2013;  prevnar 2015; s/p zostavax, s/p  shingrex -COVID VAX x3 -Had a flu shot   --Cscope 11-2005:  polyps-adenomatous, repeated  colonoscopy 11/2008 , 04-2012, 04/2017, + polyps, 3 years.  Due for colonoscopy, encouraged to call GI -- Female care per gyn, MMG 03-2020 ([facility]), Pap smear 01/20/2019 ([facility]) --Palpable aorta-- US neg for AAA 07-2012 --diet and exercise : Unable to exercise much, recommend portion control --Labs:BMP, CBC, TSH -Lung cancer screening,  she might qualify, will refer her for screening, she is willing to pay out-of-pocket if needed. -- advance directives: d/w pt

## 2020-06-02 NOTE — Assessment & Plan Note (Signed)
Here for CPX Hypothyroidism: Checking a TSH. Hyperlipidemia: Well-controlled, continue Lipitor. Pain management: Currently on hydrocodone 4 tablets daily, Celebrex 200 mg twice daily.  No GI side effects, likes to cut down on Celebrex and I agree. Will decrease to 1 tablet daily on, she will call in 1 month and consider going to 100 mg daily. CAD, aortic sclerosis per CT: Asymptomatic, controlling CV RF. RTC 3 to 4 months

## 2020-06-04 ENCOUNTER — Other Ambulatory Visit: Payer: Self-pay | Admitting: Obstetrics and Gynecology

## 2020-06-11 ENCOUNTER — Encounter: Payer: Self-pay | Admitting: Gastroenterology

## 2020-06-11 ENCOUNTER — Telehealth: Payer: Self-pay | Admitting: Internal Medicine

## 2020-06-11 NOTE — Telephone Encounter (Signed)
Called and left Vm to return call to office. -JMA

## 2020-06-11 NOTE — Telephone Encounter (Signed)
Called and spoke with patient and will wait she want to check for herself, she will call us if she wants to proceed with the order later on. -JMA

## 2020-06-11 NOTE — Telephone Encounter (Signed)
Noted, thank you

## 2020-06-11 NOTE — Telephone Encounter (Signed)
Referral message: Due to national lung cancer screening guidelines we are not able to screen patients that have quit smoking greater than 15 years ago. Dr Larose Kells would need to place the order for the CT.  Imaging charges [phone] for out of pocket CT.   Please inform patient of above, if she is willing to proceed out-of-pocket I will put the order.

## 2020-06-15 DIAGNOSIS — H2513 Age-related nuclear cataract, bilateral: Secondary | ICD-10-CM | POA: Diagnosis not present

## 2020-06-15 DIAGNOSIS — Z0100 Encounter for examination of eyes and vision without abnormal findings: Secondary | ICD-10-CM | POA: Diagnosis not present

## 2020-06-15 DIAGNOSIS — H04332 Acute lacrimal canaliculitis of left lacrimal passage: Secondary | ICD-10-CM | POA: Diagnosis not present

## 2020-06-15 DIAGNOSIS — H04123 Dry eye syndrome of bilateral lacrimal glands: Secondary | ICD-10-CM | POA: Diagnosis not present

## 2020-06-15 DIAGNOSIS — H5111 Convergence insufficiency: Secondary | ICD-10-CM | POA: Diagnosis not present

## 2020-06-21 ENCOUNTER — Telehealth: Payer: Self-pay | Admitting: Internal Medicine

## 2020-06-22 MED ORDER — HYDROCODONE-ACETAMINOPHEN 7.5-325 MG PO TABS: 1.0000 | ORAL_TABLET | Freq: Four times a day (QID) | ORAL | 0 refills | Status: DC | PRN

## 2020-06-22 NOTE — Telephone Encounter (Signed)
PDMP okay, Rx sent 

## 2020-06-22 NOTE — Telephone Encounter (Signed)
Requesting: hydrocodone 7.5-325mg  Contract: 01/29/2020 UDS: 01/29/2020 Last Visit: 06/01/2020 Next Visit: None Last Refill: 05/24/20 #120 and 0RF Pt sig: 1 tab q6h prn  Please Advise

## 2020-06-30 ENCOUNTER — Other Ambulatory Visit: Payer: Self-pay | Admitting: Obstetrics and Gynecology

## 2020-06-30 NOTE — Telephone Encounter (Signed)
Last AEX 01/15/2019 Scheduled AEX 07/20/20

## 2020-07-06 ENCOUNTER — Other Ambulatory Visit: Payer: Self-pay | Admitting: Internal Medicine

## 2020-07-19 NOTE — Progress Notes (Signed)
GYNECOLOGY  VISIT   HPI: 75 y.o.   Married  Caucasian  female   [id number] with Patient's last menstrual period was 04/10/1998 (approximate).   here for Breast and pelvic exam.    Patient is also followed for vaginal atrophy and uses Yuvafem.  Using once a week.  Not currently sexually active.   Difficulty with cleaning following BMs.  Not splinting but has difficulty completing the BMs.  Taking opiates for pain and taking fiber to help with this.   No problems with bladder control other than leak with an occasional sneeze.  Doing well on Prolia.   Had back surgery in August.  Still recovering.   Received a booster for Covid.   GYNECOLOGIC HISTORY: Patient's last menstrual period was 04/10/1998 (approximate). Contraception: PMP Menopausal hormone therapy: Merril Abbe Last mammogram: 03-22-20 3D/Neg/Birads1 Last pap smear: 01-15-19 Neg,01/03/2017 neg, 01-08-12 Neg        OB History    Gravida  0   Para  0   Term  0   Preterm  0   AB  0   Living  0     SAB  0   IAB  0   Ectopic  0   Multiple  0   Live Births                 Patient Active Problem List   Diagnosis Date Noted  . Fusion of lumbar spine 12/03/2019  . COPD (chronic obstructive pulmonary disease) ([place]) 08/22/2019  . Spinal stenosis of lumbar region with neurogenic claudication 11/22/2018  . Coronary artery disease due to lipid rich plaque 10/03/2018  . Closed fracture of distal end of radius 04/16/2017  . Chronic back pain 03/15/2017  . PCP NOTES >>>>>>>>>>>>>>>>>>>> 05/24/2015  . Numbness and tingling of right arm 05/22/2015  . Insomnia 09/01/2014  . GERD (gastroesophageal reflux disease) 03/27/2013  . Dysphagia, pharyngoesophageal phase 03/27/2013  . Annual physical exam 06/12/2011  . Sinusitis, chronic 02/02/2011  . Osteoporosis 06/08/2010  . Fibromyalgia, pain mngmt 01/29/2009  . LIPOMA 09/30/2008  . Migraine without aura 03/04/2007  . Hypothyroidism 05/15/2006  . COLONIC POLYPS, HX  OF 05/15/2006    Past Medical History:  Diagnosis Date  . Cataract    both eyes rt. eye was removed  . Chest pain    stress test @ [facility] cardiology (-)  . Colonic polyp    bx adenomatous polyps, next scope 2010  . COPD (chronic obstructive pulmonary disease) ([place])   . Fibromyalgia    see rheumatology  . Glaucoma    not glaucoma surgically removed growths on cornea  . Hypothyroidism   . Migraine   . 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 Osteoporosis    osteopenia  . Pleuritic chest pain    etiology unclear, has beeb eval by pulmonary and rheumatology (autoimmune dz?)  . Wrist fracture 2018    Past Surgical History:  Procedure Laterality Date  . ANTERIOR LAT LUMBAR FUSION N/A 12/03/2019   Procedure: ANTERIOR LATERAL LUMBAR FUSION L2-4;  Surgeon: Melina Schools, MD;  Location: [facility];  Service: Orthopedics;  Laterality: N/A;  4 hrs  . COSMETIC SURGERY  04/12/2018   L upper eyelid ptosis repair, and trichophytic brow lift  . ESOPHAGOGASTRODUODENOSCOPY (EGD) WITH PROPOFOL N/A 03/27/2013   Procedure: ESOPHAGOGASTRODUODENOSCOPY (EGD) WITH PROPOFOL;  Surgeon: Milus Banister, MD;  Location: [facility];  Service: Endoscopy;  Laterality: N/A;  possible dil  . EYE SURGERY     growth on  cornea-bil.  . plastic surgery face  2013  . POLYPECTOMY    . right rotator cuff repair  2010  . TONSILLECTOMY      Current Outpatient Medications  Medication Sig Dispense Refill  . ADVAIR DISKUS 100-50 MCG/DOSE AEPB Inhale 1 puff into the lungs in the morning and at bedtime. 180 each 3  . ALPRAZolam (XANAX) 0.5 MG tablet TAKE 1 TABLET BY MOUTH AT BEDTIME AS NEEDED FOR ANXIETY 30 tablet 3  . atorvastatin (LIPITOR) 10 MG tablet Take 1 tablet (10 mg total) by mouth at bedtime. 90 tablet 3  . calcium carbonate (OS-CAL) 1250 (500 CA) MG chewable tablet Chew 1 tablet by mouth 2 (two) times daily.     . celecoxib (CELEBREX) 200 MG capsule Take 200 mg by mouth daily.    . Cholecalciferol (VITAMIN D3)  50 MCG (2000 UT) CHEW Chew 2,000 Units by mouth daily.     Marland Kitchen denosumab (PROLIA) 60 MG/ML SOSY injection Inject 60 mg into the skin every 6 (six) months. 1 mL 0  . DULoxetine (CYMBALTA) 30 MG capsule Take 1 capsule (30 mg total) by mouth daily. 30 capsule 3  . HYDROcodone-acetaminophen (NORCO) 7.5-325 MG tablet Take 1 tablet by mouth every 6 (six) hours as needed for moderate pain. 120 tablet 0  . hydroxypropyl methylcellulose / hypromellose (ISOPTO TEARS / GONIOVISC) 2.5 % ophthalmic solution Place 1 drop into both eyes 3 (three) times daily as needed for dry eyes.    Marland Kitchen levothyroxine (SYNTHROID) 88 MCG tablet Take 1 tablet (88 mcg total) by mouth daily before breakfast. 90 tablet 1  . Multiple Vitamin (MULTIVITAMIN) capsule Take 1 capsule by mouth daily.      . naloxone (NARCAN) 4 MG/0.1ML LIQD nasal spray kit Use as directed 2 each 0  . Probiotic Product (PROBIOTIC-10) CHEW Chew 1 tablet by mouth daily.    . vitamin B-12 (CYANOCOBALAMIN) 1000 MCG tablet Take 1,000 mcg by mouth daily.    Merril Abbe 10 MCG TABS vaginal tablet PLACE 1 TABLET VAGINALLY 2 TIMES A WEEK. 8 tablet 0   No current facility-administered medications for this visit.     ALLERGIES: Alendronate sodium, Bactrim [sulfamethoxazole-trimethoprim], Contrast media [iodinated diagnostic agents], Iodine, Milnacipran, and Penicillins  Family History  Adopted: Yes  Problem Relation Age of Onset  . Colon cancer Neg Hx   . Cancer Neg Hx   . Depression Neg Hx   . Diabetes Neg Hx   . Stroke Neg Hx   . Hypertension Neg Hx   . Heart disease Neg Hx   . Colon polyps Neg Hx     Social History   Socioeconomic History  . Marital status: Married    Spouse name: Not on file  . Number of children: 0  . Years of education: Not on file  . Highest education level: Not on file  Occupational History  . 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 Smoking status: Former Smoker    Packs/day: 1.00    Years:  38.50    Pack years: 38.50    Types: Cigarettes    Quit date: 01/13/2004    Years since quitting: 16.5  . Smokeless tobacco: Never Used  . Tobacco comment: quit 2005  Vaping Use  . Vaping Use: Never used  Substance and Sexual Activity  . Alcohol use: No    Alcohol/week: 0.0 standard drinks    Comment: rare  . Drug use: No  . Sexual activity: Not  Currently  Other Topics Concern  . Not on file  Social History Narrative   No biological children      Social Determinants of Health   Financial Resource Strain: Not on file  Food Insecurity: Not on file  Transportation Needs: No Transportation Needs  . Lack of Transportation (Medical): No  . Lack of Transportation (Non-Medical): No  Physical Activity: Not on file  Stress: Not on file  Social Connections: Not on file  Intimate Partner Violence: Not on file    Review of Systems  All other systems reviewed and are negative.   PHYSICAL EXAMINATION:    BP 122/80 (Cuff Size: Large)   Pulse 76   Ht '5\' 4"'  (1.626 m)   Wt 190 lb (86.2 kg)   LMP 04/10/1998 (Approximate)   SpO2 99%   BMI 32.61 kg/m     General appearance: alert, cooperative and appears stated age Head: Normocephalic, without obvious abnormality, atraumatic Neck: no adenopathy, supple, symmetrical, trachea midline and thyroid normal to inspection and palpation Lungs: clear to auscultation bilaterally Breasts: normal appearance, no masses or tenderness, No nipple retraction or dimpling, No nipple discharge or bleeding, No axillary or supraclavicular adenopathy Heart: regular rate and rhythm Abdomen: soft, non-tender, no masses,  no organomegaly Extremities: extremities normal, atraumatic, no cyanosis or edema Skin: Skin color, texture, turgor normal. No rashes or lesions Lymph nodes: Cervical, supraclavicular, and axillary nodes normal. No abnormal inguinal nodes palpated Neurologic: Grossly normal  Pelvic: External genitalia:  no lesions              Urethra:   normal appearing urethra with no masses, tenderness or lesions              Bartholins and Skenes: normal                 Vagina: normal appearing vagina with normal color and discharge, no lesions              Cervix: no lesions                Bimanual Exam:  Uterus:  normal size, contour, position, consistency, mobility, non-tender              Adnexa: no mass, fullness, tenderness              Rectal exam: Yes.  .  Confirms.  First degree rectocele.              Anus:  normal sphincter tone, no lesions  Chaperone was present for exam.  ASSESSMENT  Screening breast exam.  Pelvic exam with abnormal finding present. First degree rectocele.  Stable.  Vaginal atrophy. Osteopenia.  Followed by endocrinology.  PLAN  Mammogram recommended yearly.  selft breast exam encouraged.  Ok to continue Vagifem, 10 mcg, pv, once to twice weekly.  Disp:  8, RF:  11.  Continue fiber.  FU yearly and prn.   25 min total time was spent for this patient encounter, including preparation, face-to-face counseling with the patient, coordination of care, and documentation of the encounter.

## 2020-07-20 ENCOUNTER — Ambulatory Visit: Payer: Medicare HMO | Admitting: Obstetrics and Gynecology

## 2020-07-20 ENCOUNTER — Ambulatory Visit (INDEPENDENT_AMBULATORY_CARE_PROVIDER_SITE_OTHER): Payer: Medicare HMO | Admitting: Internal Medicine

## 2020-07-20 ENCOUNTER — Encounter: Payer: Self-pay | Admitting: Obstetrics and Gynecology

## 2020-07-20 ENCOUNTER — Other Ambulatory Visit: Payer: Self-pay

## 2020-07-20 VITALS — BP 138/81 | HR 64 | Temp 97.1°F | Ht 65.0 in | Wt 190.6 lb

## 2020-07-20 VITALS — BP 122/80 | HR 76 | Ht 64.0 in | Wt 190.0 lb

## 2020-07-20 DIAGNOSIS — Z01411 Encounter for gynecological examination (general) (routine) with abnormal findings: Secondary | ICD-10-CM | POA: Diagnosis not present

## 2020-07-20 DIAGNOSIS — N816 Rectocele: Secondary | ICD-10-CM

## 2020-07-20 DIAGNOSIS — M549 Dorsalgia, unspecified: Secondary | ICD-10-CM | POA: Diagnosis not present

## 2020-07-20 DIAGNOSIS — N952 Postmenopausal atrophic vaginitis: Secondary | ICD-10-CM

## 2020-07-20 DIAGNOSIS — K121 Other forms of stomatitis: Secondary | ICD-10-CM

## 2020-07-20 DIAGNOSIS — Z1239 Encounter for other screening for malignant neoplasm of breast: Secondary | ICD-10-CM

## 2020-07-20 DIAGNOSIS — F320 Major depressive disorder, single episode, mild: Secondary | ICD-10-CM | POA: Diagnosis not present

## 2020-07-20 DIAGNOSIS — R69 Illness, unspecified: Secondary | ICD-10-CM | POA: Diagnosis not present

## 2020-07-20 LAB — VITAMIN B12: Vitamin B-12: >1506 pg/mL — ABNORMAL HIGH (ref 211–911)

## 2020-07-20 LAB — FOLATE: Folate: >23.6 ng/mL (ref >5.9)

## 2020-07-20 MED ORDER — DULOXETINE HCL 30 MG PO CPEP: 30.0000 mg | ORAL_CAPSULE | Freq: Every day | ORAL | 3 refills | Status: DC

## 2020-07-20 MED ORDER — ESTRADIOL 10 MCG VA TABS: ORAL_TABLET | VAGINAL | 11 refills | Status: DC

## 2020-07-20 NOTE — Progress Notes (Signed)
Subjective:    Patient ID: [name], female    DOB: 1946/02/02, 75 y.o.   MRN: [id number]  DOS:  07/20/2020 Type of visit - description: Acute  Set up the visit for stomatitis but we discussed several issues.  Having recurrent canker sores at different places in the mouth. Saw her dentist, everything was okay from their standpoint, prescribed a topical steroid (not helping much) She denies any GU ulcers or conjunctivitis type of symptoms.  Also, request PT referral:  Had back surgery August 2021, doing PT for that particular issue, although back pain is better she now has developed some right hip pain and upper back pain and those issues have not been addressed by the PT at the orthopedic office.  Depression: Due to ongoing pain even after surgery she is getting depressed, sad, down. Denies any suicidal ideas.  Wt Readings from Last 3 Encounters:  07/20/20 190 lb (86.2 kg)  07/20/20 190 lb 9.6 oz (86.5 kg)  06/01/20 189 lb 12.8 oz (86.1 kg)     Review of Systems See above   Past Medical History:  Diagnosis Date  . Cataract    both eyes rt. eye was removed  . Chest pain    stress test @ [facility] cardiology (-)  . Colonic polyp    bx adenomatous polyps, next scope 2010  . COPD (chronic obstructive pulmonary disease) ([place])   . Fibromyalgia    see rheumatology  . Glaucoma    not glaucoma surgically removed growths on cornea  . Hypothyroidism   . Migraine   . 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 Osteoporosis    osteopenia  . Pleuritic chest pain    etiology unclear, has beeb eval by pulmonary and rheumatology (autoimmune dz?)  . Wrist fracture 2018    Past Surgical History:  Procedure Laterality Date  . ANTERIOR LAT LUMBAR FUSION N/A 12/03/2019   Procedure: ANTERIOR LATERAL LUMBAR FUSION L2-4;  Surgeon: Melina Schools, MD;  Location: [facility];  Service: Orthopedics;  Laterality: N/A;  4 hrs  . COSMETIC SURGERY  04/12/2018   L upper eyelid ptosis repair, and  trichophytic brow lift  . ESOPHAGOGASTRODUODENOSCOPY (EGD) WITH PROPOFOL N/A 03/27/2013   Procedure: ESOPHAGOGASTRODUODENOSCOPY (EGD) WITH PROPOFOL;  Surgeon: Milus Banister, MD;  Location: [facility];  Service: Endoscopy;  Laterality: N/A;  possible dil  . 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  . plastic surgery face  2013  . POLYPECTOMY    . right rotator cuff repair  2010  . TONSILLECTOMY      Allergies as of 07/20/2020      Reactions   Alendronate Sodium Hives   Bactrim [sulfamethoxazole-trimethoprim]    Upset stomach   Contrast Media [iodinated Diagnostic Agents] Hives   Iodine Hives   Milnacipran    REACTION: increased bp, increased hr   Penicillins Hives      Medication List       Accurate as of July 20, 2020 11:59 PM. If you have any questions, ask your nurse or doctor.        STOP taking these medications   ondansetron 4 MG tablet Commonly known as: Zofran Stopped by: Arloa Koh, MD     TAKE these medications   Advair Diskus 100-50 MCG/DOSE Aepb Generic drug: Fluticasone-Salmeterol Inhale 1 puff into the lungs in the morning and at bedtime.   ALPRAZolam 0.5 MG tablet Commonly known as: XANAX TAKE 1 TABLET BY MOUTH AT BEDTIME  AS NEEDED FOR ANXIETY   atorvastatin 10 MG tablet Commonly known as: LIPITOR Take 1 tablet (10 mg total) by mouth at bedtime.   calcium carbonate 1250 (500 Ca) MG chewable tablet Commonly known as: OS-CAL Chew 1 tablet by mouth 2 (two) times daily.   celecoxib 200 MG capsule Commonly known as: CELEBREX Take 200 mg by mouth daily.   denosumab 60 MG/ML Sosy injection Commonly known as: PROLIA Inject 60 mg into the skin every 6 (six) months.   DULoxetine 30 MG capsule Commonly known as: Cymbalta Take 1 capsule (30 mg total) by mouth daily. Started by: Kathlene November, MD   Estradiol 10 MCG Tabs vaginal tablet Commonly known as: Yuvafem PLACE 1 TABLET VAGINALLY 2 TIMES A WEEK.   HYDROcodone-acetaminophen 7.5-325 MG  tablet Commonly known as: NORCO Take 1 tablet by mouth every 6 (six) hours as needed for moderate pain.   hydroxypropyl methylcellulose / hypromellose 2.5 % ophthalmic solution Commonly known as: ISOPTO TEARS / GONIOVISC Place 1 drop into both eyes 3 (three) times daily as needed for dry eyes.   levothyroxine 88 MCG tablet Commonly known as: SYNTHROID Take 1 tablet (88 mcg total) by mouth daily before breakfast.   multivitamin capsule Take 1 capsule by mouth daily.   Narcan 4 MG/0.1ML Liqd nasal spray kit Generic drug: naloxone Use as directed   Probiotic-10 Chew Chew 1 tablet by mouth daily.   vitamin B-12 1000 MCG tablet Commonly known as: CYANOCOBALAMIN Take 1,000 mcg by mouth daily.   Vitamin D3 50 MCG (2000 UT) Chew Chew 2,000 Units by mouth daily.          Objective:   Physical Exam BP 138/81 (BP Location: Left Arm, Patient Position: Sitting, Cuff Size: Large)   Pulse 64   Temp (!) 97.1 F (36.2 C) (Temporal)   Ht _0  (1.651 m)   Wt 190 lb 9.6 oz (86.5 kg)   LMP 04/10/1998 (Approximate)   SpO2 100%   BMI 31.72 kg/m  General:   Well developed, NAD, BMI noted. HEENT:  Normocephalic . Face symmetric, atraumatic Oral membranes: She has two minute superficial ulcers, at the lower lip and right side of the tongue.  They are not hard on palpation.  She has several missing teeth but otherwise they seem to be in good health. Palpation of the gum is painless, no obvious abscess. Neck: No lymphadenopathies Lower extremities: no pretibial edema bilaterally  Skin: Not pale. Not jaundice Neurologic:  alert & oriented X3.  Speech normal, gait appropriate for age and unassisted Psych--  Cognition and judgment appear intact.  Cooperative with normal attention span and concentration.  Behavior appropriate. She was tearful at times during the visit     Assessment       Assessment Hypothyroidism Hyperlipidemia: Started meds 09/2019 GERD Migraines insmonia--  xanax prn COPD: per chest CTs, former heavy smoker, quit 2005 [facility]- Pain mngmt  -- back pain, 2017 had a MRI, saw Dr Durward Fortes, Rx local injection, PT --History of fibromyalgia --H/o Pleuritic chest pain, on-off, resolved  --spinal stenosis (MRI 05/2018)  Osteopenia:  Per ENDO --T score 2011   -2.6 (intolerant to fosamax, ok w/ Boniva x years) --T score 01-2013   -2.6 --T score 02-2015  -2.3, rx ca and cit D  Glaucoma Ocular Rosacea 2010 CV: LAD  artery disease  per CT scan of the chest, CP:  admitted, stress lest low risk 05-2015.  Control CV RF CAD, aortic sclerosis: Per CT  PLAN: Recurring canker  sores, stomatitis: Unclear etiology, recently saw her dentist and no obvious dental problems found. She uses Advair, she tried to stop it and got COPD symptoms. Plan: Check a folic acid, D32, continue symptomatic treatment (she is using milk of magnesia).  If problems continue consider ENT referral or switch Advair to a different medication. Pain management: Having upper back pain, request a PT referral.  Will do Also, had back surgery last year, although it took care of the low back pain is has created a pain around the right hip.  These has her extremity frustrated.  See next Depression: Although PHQ-9 is not very high, for the last few weeks she has been depressed, sad, down all related to chronic pain. No anxiety.  Some insomnia mostly due to pain.  No suicidal ideas. Options discussed, strongly recommend to see a counselor information provided.  We agreed to start Cymbalta 30 mg daily.  Call in few weeks if she feels she would need higher dose. Listening therapy provided RTC as scheduled for June   Time spent 32 minutes  This visit occurred during the SARS-CoV-2 public health emergency.  Safety protocols were in place, including screening questions prior to the visit, additional usage of staff PPE, and extensive cleaning of exam room while observing appropriate contact time as  indicated for disinfecting solutions.

## 2020-07-20 NOTE — Patient Instructions (Signed)
Start taking Cymbalta 30 mg 1 tablet daily. Let me know in few weeks how that is working for you  We are referring you to physical therapy  GO TO THE LAB : Get the blood work    See you in June for your next appointment

## 2020-07-20 NOTE — Patient Instructions (Signed)

## 2020-07-21 ENCOUNTER — Ambulatory Visit: Payer: Medicare HMO | Admitting: Internal Medicine

## 2020-07-21 NOTE — Assessment & Plan Note (Signed)
Recurring canker sores, stomatitis: Unclear etiology, recently saw her dentist and no obvious dental problems found. She uses Advair, she tried to stop it and got COPD symptoms. Plan: Check a folic acid, R83, continue symptomatic treatment (she is using milk of magnesia).  If problems continue consider ENT referral or switch Advair to a different medication. Pain management: Having upper back pain, request a PT referral.  Will do Also, had back surgery last year, although it took care of the low back pain is has created a pain around the right hip.  These has her extremity frustrated.  See next Depression: Although PHQ-9 is not very high, for the last few weeks she has been depressed, sad, down all related to chronic pain. No anxiety.  Some insomnia mostly due to pain.  No suicidal ideas. Options discussed, strongly recommend to see a counselor information provided.  We agreed to start Cymbalta 30 mg daily.  Call in few weeks if she feels she would need higher dose. Listening therapy provided RTC as scheduled for June

## 2020-07-22 ENCOUNTER — Encounter: Payer: Self-pay | Admitting: Internal Medicine

## 2020-07-25 ENCOUNTER — Telehealth: Payer: Self-pay | Admitting: Internal Medicine

## 2020-07-26 MED ORDER — HYDROCODONE-ACETAMINOPHEN 7.5-325 MG PO TABS: 1.0000 | ORAL_TABLET | Freq: Four times a day (QID) | ORAL | 0 refills | Status: DC | PRN

## 2020-07-26 NOTE — Telephone Encounter (Signed)
Requesting: hydrocodone 7.5-325mg  Contract: 01/29/2020 UDS: 01/29/20 Last Visit: 07/20/20 Next Visit: None Last Refill: 06/22/20 #120 and 0RF Pt sig: 1 tab q6h prn  Please Advise

## 2020-07-26 NOTE — Telephone Encounter (Signed)
PDMP okay, prescription sent 

## 2020-08-10 ENCOUNTER — Ambulatory Visit: Payer: Medicare HMO | Attending: Internal Medicine | Admitting: Physical Therapy

## 2020-08-10 ENCOUNTER — Other Ambulatory Visit: Payer: Self-pay

## 2020-08-10 ENCOUNTER — Encounter: Payer: Self-pay | Admitting: Physical Therapy

## 2020-08-10 DIAGNOSIS — R262 Difficulty in walking, not elsewhere classified: Secondary | ICD-10-CM | POA: Diagnosis not present

## 2020-08-10 DIAGNOSIS — R2689 Other abnormalities of gait and mobility: Secondary | ICD-10-CM | POA: Diagnosis not present

## 2020-08-10 DIAGNOSIS — M545 Low back pain, unspecified: Secondary | ICD-10-CM | POA: Insufficient documentation

## 2020-08-10 DIAGNOSIS — M546 Pain in thoracic spine: Secondary | ICD-10-CM | POA: Insufficient documentation

## 2020-08-10 DIAGNOSIS — M6281 Muscle weakness (generalized): Secondary | ICD-10-CM | POA: Diagnosis not present

## 2020-08-10 DIAGNOSIS — G8929 Other chronic pain: Secondary | ICD-10-CM | POA: Diagnosis not present

## 2020-08-10 NOTE — Therapy (Signed)
[facility] [address]  [place], [place], [address] Phone: [phone]   Fax:  [phone]  Physical Therapy Evaluation  Patient Details  Name: [name] MRN: [id number] Date of Birth: [birth year] Referring Provider (PT): Kathlene November, MD   Encounter Date: 08/10/2020   PT End of Session - 08/10/20 1204    Visit Number 1    Number of Visits 17    Date for PT Re-Evaluation 10/05/20    Authorization Type [employer]    PT Start Time 1016    PT Stop Time 1100    PT Time Calculation (min) 44 min    Activity Tolerance Patient tolerated treatment well    Behavior During Therapy [facility] for tasks assessed/performed           Past Medical History:  Diagnosis Date  . Cataract    both eyes rt. eye was removed  . Chest pain    stress test @ [facility] cardiology (-)  . Colonic polyp    bx adenomatous polyps, next scope 2010  . COPD (chronic obstructive pulmonary disease) ([place])   . Fibromyalgia    see rheumatology  . Glaucoma    not glaucoma surgically removed growths on cornea  . Hypothyroidism   . Migraine   . 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 Osteoporosis    osteopenia  . Pleuritic chest pain    etiology unclear, has beeb eval by pulmonary and rheumatology (autoimmune dz?)  . Wrist fracture 2018    Past Surgical History:  Procedure Laterality Date  . ANTERIOR LAT LUMBAR FUSION N/A 12/03/2019   Procedure: ANTERIOR LATERAL LUMBAR FUSION L2-4;  Surgeon: Melina Schools, MD;  Location: [facility];  Service: Orthopedics;  Laterality: N/A;  4 hrs  . COSMETIC SURGERY  04/12/2018   L upper eyelid ptosis repair, and trichophytic brow lift  . ESOPHAGOGASTRODUODENOSCOPY (EGD) WITH PROPOFOL N/A 03/27/2013   Procedure: ESOPHAGOGASTRODUODENOSCOPY (EGD) WITH PROPOFOL;  Surgeon: Milus Banister, MD;  [facility];  Service: Endoscopy;  Laterality: N/A;  possible dil  . 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  .  plastic surgery face  2013  . POLYPECTOMY    . right rotator cuff repair  2010  . TONSILLECTOMY      There were no vitals filed for this visit.    Subjective Assessment - 08/10/20 1018    Subjective Patient reports having had a lumbar fusion in August, followed by PT at [facility]. Was discharged with HEP which she continued to do, but noticed worsening first upper back, then lower back pain. Reports that she has always had upper back pain d/t a "fractured disc" and now notes difficulty standing up tall by the end of the day and worsening pain with lifting. Pain is located along the midline of the spine and radiates out a little bit. Notes N/T in the R LE since her surgery. No B&B changes. Still has LBP laying on the R side, walking, standing, sitting, and putting on her shoes/socks.    Pertinent History wrist fx, pleuritic chest pain, osteoporosis, migraine, hypothyroidism, glaucoma, fibromyalgia, R RTC repair, anterior lateral lumbar fusion L2-4 2021    Limitations Lifting;Standing;Walking;House hold activities;Sitting    How long can you sit comfortably? 1 hour on couch- weakness upon standing    How long can you stand comfortably? 30 min with grocery cart, 15 min without    How long can you walk  comfortably? 30 min with grocery cart, 15 min without    Diagnostic tests none recent    Patient Stated Goals decrease pain    Currently in Pain? Yes    Pain Score 1     Pain Location Back    Pain Orientation Upper    Pain Descriptors / Indicators Sharp    Pain Type Chronic pain    Multiple Pain Sites Yes    Pain Score 3    Pain Location Back    Pain Orientation Lower    Pain Descriptors / Indicators Constant    Pain Type Chronic pain              [facility] PT Assessment - 08/10/20 1027      Assessment   Medical Diagnosis Upper back pain    Referring Provider (PT) Kathlene November, MD    Onset Date/Surgical Date 12/03/19    Hand Dominance Right    Next MD Visit 08/11/20    Prior Therapy  yes      Precautions   Precautions None      Balance Screen   Has the patient fallen in the past 6 months No    Has the patient had a decrease in activity level because of a fear of falling?  No    Is the patient reluctant to leave their home because of a fear of falling?  No      Home Environment   Living Environment Private residence    Living Arrangements Spouse/significant other    Type of [place] to enter    Entrance Stairs-Number of Steps 1    Entrance Stairs-Rails Right    Home Layout Two level    Alternate Level Stairs-Number of Steps 12    Alternate Level Stairs-Rails Right    [place] - 4 wheels      Prior Function   Level of Independence Independent    Vocation Retired    Leisure jigsaw puzzles, garden      Associate Professor   Overall Cognitive Status Within Functional Limits for tasks assessed      Sensation   Light Touch Appears Intact      Coordination   Gross Motor Movements are Fluid and Coordinated Yes      Posture/Postural Control   Posture/Postural Control Postural limitations    Postural Limitations Forward head;Rounded Shoulders      ROM / Strength   AROM / PROM / Strength AROM;Strength      AROM   AROM Assessment Site Lumbar;Thoracic    Lumbar Flexion mid shin   discomfort   Lumbar Extension severely limited    Lumbar - Right Side Bend mid thigh    Lumbar - Left Side Bend mid thigh    Lumbar - Right Rotation severely limited   pain   Lumbar - Left Rotation moderately-severely limited    Thoracic Flexion [facility]    Thoracic Extension moderately-severely limited    Thoracic - Right Side Bend [facility]    Thoracic - Left Side Bend [facility]    Thoracic - Right Rotation moderately limited   pain   Thoracic - Left Rotation moderately limited      Strength   Strength Assessment Site Hip;Knee;Ankle;Shoulder    Right/Left Shoulder Right;Left    Right Shoulder Flexion 4-/5    Right Shoulder ABduction 4-/5    Right Shoulder  Internal Rotation 4/5    Right Shoulder External Rotation 4/5  Left Shoulder Flexion 4+/5    Left Shoulder ABduction 4+/5    Left Shoulder Internal Rotation 4+/5    Left Shoulder External Rotation 4+/5    Right/Left Hip Right;Left    Right Hip Flexion 3+/5    Right Hip ABduction 4+/5    Right Hip ADduction 4+/5    Left Hip Flexion 4+/5    Left Hip ABduction 4+/5    Left Hip ADduction 4+/5    Right/Left Knee Right;Left    Right Knee Flexion 4-/5    Right Knee Extension 4-/5    Left Knee Flexion 4+/5    Left Knee Extension 4+/5    Right/Left Ankle Right;Left    Right Ankle Dorsiflexion 4-/5    Right Ankle Plantar Flexion 4/5   5 reps   Left Ankle Dorsiflexion 4+/5    Left Ankle Plantar Flexion 4+/5   10 reps     Flexibility   Soft Tissue Assessment /Muscle Length yes    Piriformis R moderately tight in fig 4      Palpation   Palpation comment TTP along midline of L1-sacrum, B PSIS, R greater trochanter; soft tissue restriction in B QL and thoracolumbar paraspinals      Ambulation/Gait   Assistive device None    Gait Pattern Step-to pattern;Step-through pattern;Decreased stance time - right;Decreased step length - left;Decreased weight shift to right;Decreased dorsiflexion - right;Antalgic    Ambulation Surface Level;Indoor    Gait velocity decreased                      Objective measurements completed on examination: See above findings.               PT Education - 08/10/20 1204    Education Details prognosis, POC, HEP    Person(s) Educated Patient    Methods Explanation;Demonstration;Tactile cues;Verbal cues;Handout    Comprehension Verbalized understanding;Returned demonstration            PT Short Term Goals - 08/10/20 1210      PT SHORT TERM GOAL #1   Title Patient to be independent with initial HEP.    Time 3    Period Weeks    Status New    Target Date 08/31/20             PT Long Term Goals - 08/10/20 1210      PT LONG  TERM GOAL #1   Title Patient to be independent with advanced HEP.    Time 8    Period Weeks    Status New    Target Date 10/05/20      PT LONG TERM GOAL #2   Title Patient to demonstrate functional and pain-free lumbar AROM.    Time 8    Period Weeks    Status New    Target Date 10/05/20      PT LONG TERM GOAL #3   Title Patient to demonstrate >=4+/5 strength in B LEs and B shoulders.    Time 8    Period Weeks    Status New    Target Date 10/05/20      PT LONG TERM GOAL #4   Title Patient to report tolerance to don socks/shoes without pain limiting.    Time 8    Period Weeks    Status New    Target Date 10/05/20      PT LONG TERM GOAL #5   Title Patient to report tolerance for 1 hour of sitting while  doing puzzles without pain.    Time 8    Period Weeks    Status New    Target Date 10/05/20                  Plan - 08/10/20 1204    Clinical Impression Statement Patient is a 75 y/o F presenting to OPPT with c/o chronic midback and LBP for the past several years, worsening since L2-4 fusion on 08/25-26/21. Pain occurs along the midline of the spine. Midback pain worse towards the end of the day and with lifting. LBP worse with R sidelying, walking, standing, sitting, and donning socks/shoes. Notes intermittent R foot N/T since her surgery. Patient today presenting with limited and painful thoracic and lumbar AROM, marked weakness in R UE and LE, decreased R hip ROM/flexibility, TTP along midline of L1-sacrum, B PSIS, R greater trochanter; soft tissue restriction in B QL and thoracolumbar paraspinals, and gait deviations. Patient was educated on gentle stretching and strengthening HEP- patient reported understanding. Would benefit from skilled PT services 2x/week for 8 weeks to address aforementioned impairments.    Personal Factors and Comorbidities Age;Comorbidity 3+;Fitness;Past/Current Experience;Time since onset of injury/illness/exacerbation    Comorbidities wrist  fx, pleuritic chest pain, osteoporosis, migraine, hypothyroidism, glaucoma, fibromyalgia, R RTC repair, anterior lateral lumbar fusion L2-4 2021    Examination-Activity Limitations Bed Mobility;Sleep;Sit;Bend;Squat;Stairs;Carry;Stand;Toileting;Dressing;Transfers;Hygiene/Grooming;Lift;Locomotion Level;Reach Overhead    Examination-Participation Restrictions Church;Cleaning;Community Activity;Driving;Laundry;Meal Prep;Shop    Stability/Clinical Decision Making Stable/Uncomplicated    Clinical Decision Making Low    Rehab Potential Good    PT Frequency 2x / week    PT Duration 8 weeks    PT Treatment/Interventions ADLs/Self Care Home Management;Cryotherapy;Electrical Stimulation;Moist Heat;Balance training;Therapeutic exercise;Therapeutic activities;Functional mobility training;Stair training;Gait training;Ultrasound;Neuromuscular re-education;Patient/family education;Manual techniques;Vasopneumatic Device;Taping;Energy conservation;Dry needling;Passive range of motion    PT Next Visit Plan lumbar FOTO; reassess HEP; progress R UE and LE strength, lumbopelvic mobility    Consulted and Agree with Plan of Care Patient           Patient will benefit from skilled therapeutic intervention in order to improve the following deficits and impairments:  Abnormal gait,Hypomobility,Decreased activity tolerance,Decreased strength,Pain,Increased fascial restricitons,Decreased balance,Difficulty walking,Increased muscle spasms,Improper body mechanics,Decreased range of motion,Impaired flexibility,Postural dysfunction  Visit Diagnosis: Pain in thoracic spine  Chronic midline low back pain without sciatica  Muscle weakness (generalized)  Other abnormalities of gait and mobility  Difficulty in walking, not elsewhere classified     Problem List Patient Active Problem List   Diagnosis Date Noted  . Fusion of lumbar spine 12/03/2019  . COPD (chronic obstructive pulmonary disease) ([place]) 08/22/2019  .  Spinal stenosis of lumbar region with neurogenic claudication 11/22/2018  . Coronary artery disease due to lipid rich plaque 10/03/2018  . Closed fracture of distal end of radius 04/16/2017  . Chronic back pain 03/15/2017  . PCP NOTES >>>>>>>>>>>>>>>>>>>> 05/24/2015  . Numbness and tingling of right arm 05/22/2015  . Insomnia 09/01/2014  . GERD (gastroesophageal reflux disease) 03/27/2013  . Dysphagia, pharyngoesophageal phase 03/27/2013  . Annual physical exam 06/12/2011  . Sinusitis, chronic 02/02/2011  . Osteoporosis 06/08/2010  . Fibromyalgia, pain mngmt 01/29/2009  . LIPOMA 09/30/2008  . Migraine without aura 03/04/2007  . Hypothyroidism 05/15/2006  . COLONIC POLYPS, HX OF 05/15/2006     Janene Harvey, PT, DPT 08/10/20 12:15 PM   [facility] [address]  [place], [place], [address] Phone: [phone]   Fax:  [phone]  Name: AHAANA ROCHETTE MRN:  [id number] Date of Birth: 06-27-1945

## 2020-08-11 NOTE — Progress Notes (Signed)
Subjective:   [name] is a 75 y.o. female who presents for Medicare Annual (Subsequent) preventive examination.   I connected with [name] today by telephone and verified that I am speaking with the correct person using two identifiers. Location patient: home Location provider: work Persons participating in the virtual visit: patient, Marine scientist.    I discussed the limitations, risks, security and privacy concerns of performing an evaluation and management service by telephone and the availability of in person appointments. I also discussed with the patient that there may be a patient responsible charge related to this service. The patient expressed understanding and verbally consented to this telephonic visit.    Interactive audio and video telecommunications were attempted between this provider and patient, however failed, due to patient having technical difficulties OR patient did not have access to video capability.  We continued and completed visit with audio only.  Some vital signs may be absent or patient reported.   Time Spent with patient on telephone encounter: 20 minutes  Review of Systems     Cardiac Risk Factors include: advanced age (>84mn, >>26women);obesity (BMI >30kg/m2);dyslipidemia     Objective:    Today's Vitals   08/12/20 0830  Weight: 190 lb (86.2 kg)  Height: _0  (1.651 m)  PainSc: 3    Body mass index is 31.62 kg/m.  Advanced Directives 08/12/2020 08/10/2020 12/04/2019 12/01/2019 07/18/2019 01/24/2018 02/27/2017  Does Patient Have a Medical Advance Directive? Yes Yes No No Yes Yes Yes  Type of [place]amed[place]of [place] will - - - [place] will - [place]  Does patient want to make changes to medical advance directive? - No - Patient declined - - No - Patient declined No - Patient declined No - Patient declined  Copy of [place] Chart? Yes -  validated most recent copy scanned in chart (See row information) - - - No - copy requested - Yes  Would patient like information on creating a medical advance directive? - - No - Patient declined No - Patient declined - - -  Pre-existing out of facility DNR order (yellow form or pink MOST form) - - - - - - -    Current Medications (verified) Outpatient Encounter Medications as of 08/12/2020  Medication Sig  . ADVAIR DISKUS 100-50 MCG/DOSE AEPB Inhale 1 puff into the lungs in the morning and at bedtime.  . ALPRAZolam (XANAX) 0.5 MG tablet TAKE 1 TABLET BY MOUTH AT BEDTIME AS NEEDED FOR ANXIETY  . atorvastatin (LIPITOR) 10 MG tablet Take 1 tablet (10 mg total) by mouth at bedtime.  . calcium carbonate (OS-CAL) 1250 (500 CA) MG chewable tablet Chew 1 tablet by mouth 2 (two) times daily.   . celecoxib (CELEBREX) 200 MG capsule Take 200 mg by mouth daily.  . Cholecalciferol (VITAMIN D3) 50 MCG (2000 UT) CHEW Chew 2,000 Units by mouth daily.   .Marland Kitchendenosumab (PROLIA) 60 MG/ML SOSY injection Inject 60 mg into the skin every 6 (six) months.  . DULoxetine (CYMBALTA) 30 MG capsule Take 1 capsule (30 mg total) by mouth daily.  . Estradiol (YUVAFEM) 10 MCG TABS vaginal tablet PLACE 1 TABLET VAGINALLY 2 TIMES A WEEK.  .Marland KitchenHYDROcodone-acetaminophen (NORCO) 7.5-325 MG tablet Take 1 tablet by mouth every 6 (six) hours as needed for moderate pain.  . hydroxypropyl methylcellulose / hypromellose (ISOPTO TEARS / GONIOVISC) 2.5 % ophthalmic solution Place 1 drop into both eyes 3 (three)  times daily as needed for dry eyes.  Marland Kitchen levothyroxine (SYNTHROID) 88 MCG tablet Take 1 tablet (88 mcg total) by mouth daily before breakfast.  . Multiple Vitamin (MULTIVITAMIN) capsule Take 1 capsule by mouth daily.    . naloxone (NARCAN) 4 MG/0.1ML LIQD nasal spray kit Use as directed  . Probiotic Product (PROBIOTIC-10) CHEW Chew 1 tablet by mouth daily.  . vitamin B-12 (CYANOCOBALAMIN) 1000 MCG tablet Take 1,000 mcg by mouth daily.    No facility-administered encounter medications on file as of 08/12/2020.    Allergies (verified) Alendronate sodium, Bactrim [sulfamethoxazole-trimethoprim], Contrast media [iodinated diagnostic agents], Iodine, Milnacipran, and Penicillins   History: Past Medical History:  Diagnosis Date  . Cataract    both eyes rt. eye was removed  . Chest pain    stress test @ [facility] (-)  . Colonic polyp    bx adenomatous polyps, next scope 2010  . COPD (chronic obstructive pulmonary disease) ([place])   . Fibromyalgia    see rheumatology  . Glaucoma    not glaucoma surgically removed growths on cornea  . Hypothyroidism   . Migraine   . 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 Osteoporosis    osteopenia  . Pleuritic chest pain    etiology unclear, has beeb eval by pulmonary and rheumatology (autoimmune dz?)  . Wrist fracture 2018   Past Surgical History:  Procedure Laterality Date  . ANTERIOR LAT LUMBAR FUSION N/A 12/03/2019   Procedure: ANTERIOR LATERAL LUMBAR FUSION L2-4;  Surgeon: Melina Schools, MD;  Location: [place];  Service: Orthopedics;  Laterality: N/A;  4 hrs  . COSMETIC SURGERY  04/12/2018   L upper eyelid ptosis repair, and trichophytic brow lift  . ESOPHAGOGASTRODUODENOSCOPY (EGD) WITH PROPOFOL N/A 03/27/2013   Procedure: ESOPHAGOGASTRODUODENOSCOPY (EGD) WITH PROPOFOL;  Surgeon: Milus Banister, MD;  Location[facility];  Service: Endoscopy;  Laterality: N/A;  possible dil  . 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  . plastic surgery face  2013  . POLYPECTOMY    . right rotator cuff repair  2010  . TONSILLECTOMY     Family History  Adopted: Yes  Problem Relation Age of Onset  . Colon cancer Neg Hx   . Cancer Neg Hx   . Depression Neg Hx   . Diabetes Neg Hx   . Stroke Neg Hx   . Hypertension Neg Hx   . Heart disease Neg Hx   . Colon polyps Neg Hx    Social History   Socioeconomic History  . Marital status: Married    Spouse name: Not on file  . Number  of children: 0  . Years of education: Not on file  . Highest education level: Not on file  Occupational History  . 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 Smoking status: Former Smoker    Packs/day: 1.00    Years: 38.50    Pack years: 38.50    Types: Cigarettes    Quit date: 01/13/2004    Years since quitting: 16.5  . Smokeless tobacco: Never Used  . Tobacco comment: quit 2005  Vaping Use  . Vaping Use: Never used  Substance and Sexual Activity  . Alcohol use: No    Alcohol/week: 0.0 standard drinks    Comment: rare  . Drug use: No  . Sexual activity: Not Currently  Other Topics Concern  . Not on file  Social History Narrative   No biological children  Social Determinants of Health   Financial Resource Strain: Low Risk   . Difficulty of Paying Living Expenses: Not hard at all  Food Insecurity: No Food Insecurity  . Worried About Charity fundraiser in the Last Year: Never true  . Ran Out of Food in the Last Year: Never true  Transportation Needs: No Transportation Needs  . Lack of Transportation (Medical): No  . Lack of Transportation (Non-Medical): No  Physical Activity: Inactive  . Days of Exercise per Week: 0 days  . Minutes of Exercise per Session: 0 min  Stress: No Stress Concern Present  . Feeling of Stress : Not at all  Social Connections: Moderately Isolated  . Frequency of Communication with Friends and Family: More than three times a week  . Frequency of Social Gatherings with Friends and Family: More than three times a week  . Attends Religious Services: Never  . Active Member of Clubs or Organizations: No  . Attends Archivist Meetings: Never  . Marital Status: Married    Tobacco Counseling Counseling given: Not Answered Comment: quit 2005   Clinical Intake:  Pre-visit preparation completed: Yes  Pain : 0-10 Pain Score: 3  Pain Type: Chronic pain Pain Location: Back Pain Onset: More than a  month ago Pain Frequency: Constant     Nutritional Status: BMI > 30  Obese Nutritional Risks: None  How often do you need to have someone help you when you read instructions, pamphlets, or other written materials from your doctor or pharmacy?: 1 - Never  Diabetic?No  Interpreter Needed?: No  Information entered by :: Caroleen Hamman LPN   Activities of Daily Living In your present state of health, do you have any difficulty performing the following activities: 08/12/2020 12/04/2019  Hearing? Tempie Donning  Comment wears hearing aids -  Vision? N N  Difficulty concentrating or making decisions? N N  Walking or climbing stairs? N N  Dressing or bathing? N Y  Doing errands, shopping? N N  Preparing Food and eating ? N -  Using the Toilet? N -  In the past six months, have you accidently leaked urine? Y -  Comment occasionally -  Do you have problems with loss of bowel control? N -  Managing your Medications? N -  Managing your Finances? N -  Housekeeping or managing your Housekeeping? N -  Some recent data might be hidden    Patient Care Team: Colon Branch, MD as PCP - General Josue Hector, MD as PCP - Cardiology (Cardiology) Nunzio Cobbs, MD as Consulting Physician (Obstetrics and Gynecology) Solon Augusta, MD as Attending Physician (Ophthalmology) Melina Schools, MD as Consulting Physician (Orthopedic Surgery)  Indicate any recent Medical Services you may have received from other than [facility] providers in the past year (date may be approximate).     Assessment:   This is a routine wellness examination for [place].  Hearing/Vision screen  Hearing Screening   _0  _1  _2  _3  _4  _5  _6  _7  _8   Right ear:           Left ear:           Comments: Wears hearing aids  Vision Screening Comments: Wears glasses Last eye exam-06/2020-Dr. Hutto  Dietary issues and exercise activities discussed: Current Exercise Habits: The patient does not  participate in regular exercise at present  Goals Addressed              This Visit's Progress  Patient Stated   .  Patient Stated (pt-stated)   Not on track     Lose 25 lbs in the next year.      Other   .  DIET - INCREASE WATER INTAKE   Not on track   .  Patient Stated        Increase activity      Depression Screen PHQ 2/9 Scores 08/12/2020 07/20/2020 06/01/2020 12/12/2019 07/18/2019 03/15/2017 03/09/2016  PHQ - 2 Score 0 0 0 1 0 0 0  PHQ- 9 Score - 6 - - - - -    Fall Risk Fall Risk  08/12/2020 07/20/2020 06/01/2020 07/18/2019 04/01/2019  Falls in the past year? 0 0 0 0 0  Number falls in past yr: 0 0 0 0 0  Injury with Fall? 0 0 0 0 0  Follow up Falls prevention discussed - - Education provided;Falls prevention discussed Falls evaluation completed    FALL RISK PREVENTION PERTAINING TO THE HOME:  Any stairs in or around the home? Yes  If so, are there any without handrails? No  Home free of loose throw rugs in walkways, pet beds, electrical cords, etc? Yes  Adequate lighting in your home to reduce risk of falls? Yes   ASSISTIVE DEVICES UTILIZED TO PREVENT FALLS:  Life alert? No  Use of a cane, walker or w/c? No  Grab bars in the bathroom? Yes  Shower chair or bench in shower? No  Elevated toilet seat or a handicapped toilet? No   TIMED UP AND GO:  Was the test performed? No .  Phone visit   Cognitive Function:Normal cognitive status assessed by  this Nurse Health Advisor. No abnormalities found.   MMSE - Mini Mental State Exam 12/01/2015  Orientation to time 5  Orientation to Place 5  Registration 3  Attention/ Calculation 5  Recall 2  Language- name 2 objects 2  Language- repeat 1  Language- follow 3 step command 3  Language- read & follow direction 1  Write a sentence 1  Copy design 1  Total score 29        Immunizations Immunization History  Administered Date(s) Administered  . Fluad Quad(high Dose 65+) 12/13/2018  . H1N1 04/09/2008  .  Influenza Split 02/28/2012  . Influenza Whole 03/04/2007, 01/29/2009, 02/07/2010  . Influenza, High Dose Seasonal PF 01/07/2015, 01/04/2016, 01/10/2017, 01/19/2020  . Influenza,inj,Quad PF,6+ Mos 01/07/2013, 02/10/2014  . Influenza,inj,quad, With Preservative 01/14/2018  . PFIZER(Purple Top)SARS-COV-2 Vaccination 05/24/2019, 06/15/2019, 01/24/2020  . Pneumococcal Conjugate-13 08/08/2013  . Pneumococcal Polysaccharide-23 05/11/2004, 06/12/2011  . Td 02/08/2006, 09/08/2015  . Zoster 06/16/2010  . Zoster Recombinat (Shingrix) 11/21/2018, 03/13/2019    TDAP status: Up to date  Flu Vaccine status: Up to date  Pneumococcal vaccine status: Up to date  Covid-19 vaccine status: Completed vaccines  Qualifies for Shingles Vaccine? No   Zostavax completed Yes   Shingrix Completed?: Yes  Screening Tests Health Maintenance  Topic Date Due  . COLONOSCOPY (Pts 45-45yr Insurance coverage will need to be confirmed)  05/04/2020  . INFLUENZA VACCINE  11/08/2020  . MAMMOGRAM  03/22/2022  . TETANUS/TDAP  09/07/2025  . DEXA SCAN  Completed  . COVID-19 Vaccine  Completed  . Hepatitis C Screening  Completed  . PNA vac Low Risk Adult  Completed  . HPV VACCINES  Aged Out    Health Maintenance  Health Maintenance Due  Topic Date Due  . COLONOSCOPY (Pts 45-433yrInsurance coverage will need to be confirmed)  05/04/2020  Colorectal cancer screening: Scheduled for 09/03/20  Mammogram status: Completed Bilateral 03/22/2020. Repeat every year  Bone Density status: Completed 02/24/2019. Results reflect: Bone density results: OSTEOPENIA. Repeat every 2 years.  Lung Cancer Screening: (Low Dose CT Chest recommended if Age 76-80 years, 30 pack-year currently smoking OR have quit w/in 15years.) does not qualify.     Additional Screening:  Hepatitis C Screening: Completed 09/08/2015  Vision Screening: Recommended annual ophthalmology exams for early detection of glaucoma and other disorders of  the eye. Is the patient up to date with their annual eye exam?  Yes  Who is the provider or what is the name of the office in which the patient attends annual eye exams? Dr. Laban Emperor   Dental Screening: Recommended annual dental exams for proper oral hygiene  Community Resource Referral / Chronic Care Management: CRR required this visit?  No   CCM required this visit?  No      Plan:     I have personally reviewed and noted the following in the patient's chart:   . Medical and social history . Use of alcohol, tobacco or illicit drugs  . Current medications and supplements including opioid prescriptions.  . Functional ability and status . Nutritional status . Physical activity . Advanced directives . List of other physicians . Hospitalizations, surgeries, and ER visits in previous 12 months . Vitals . Screenings to include cognitive, depression, and falls . Referrals and appointments  In addition, I have reviewed and discussed with patient certain preventive protocols, quality metrics, and best practice recommendations. A written personalized care plan for preventive services as well as general preventive health recommendations were provided to patient.   Due to this being a telephonic visit, the after visit summary with patients personalized plan was offered to patient via mail or my-chart. Patient would like to access on my-chart.    Marta Antu, LPN   07/11/5954  Nurse Myrle Sheng Advisor  Nurse Notes: None

## 2020-08-12 ENCOUNTER — Ambulatory Visit (INDEPENDENT_AMBULATORY_CARE_PROVIDER_SITE_OTHER): Payer: Medicare HMO

## 2020-08-12 ENCOUNTER — Other Ambulatory Visit: Payer: Self-pay

## 2020-08-12 ENCOUNTER — Ambulatory Visit: Payer: Medicare HMO

## 2020-08-12 VITALS — Ht 65.0 in | Wt 190.0 lb

## 2020-08-12 DIAGNOSIS — G8929 Other chronic pain: Secondary | ICD-10-CM | POA: Diagnosis not present

## 2020-08-12 DIAGNOSIS — R262 Difficulty in walking, not elsewhere classified: Secondary | ICD-10-CM | POA: Diagnosis not present

## 2020-08-12 DIAGNOSIS — M6281 Muscle weakness (generalized): Secondary | ICD-10-CM

## 2020-08-12 DIAGNOSIS — R2689 Other abnormalities of gait and mobility: Secondary | ICD-10-CM

## 2020-08-12 DIAGNOSIS — M546 Pain in thoracic spine: Secondary | ICD-10-CM | POA: Diagnosis not present

## 2020-08-12 DIAGNOSIS — M545 Low back pain, unspecified: Secondary | ICD-10-CM

## 2020-08-12 DIAGNOSIS — Z0000 Encounter for general adult medical examination without abnormal findings: Secondary | ICD-10-CM

## 2020-08-12 NOTE — Therapy (Signed)
[facility] [address]  [place], [place], [address] Phone: [phone]   Fax:  [phone]  Physical Therapy Treatment  Patient Details  Name: [name] MRN: [id number] Date of Birth: 1945-04-28 Referring Provider (PT): Kathlene November, MD   Encounter Date: 08/12/2020   PT End of Session - 08/12/20 1403    Visit Number 2    Number of Visits 17    Date for PT Re-Evaluation 10/05/20    Authorization Type Aetna Medicare    PT Start Time 1319    PT Stop Time 1401    PT Time Calculation (min) 42 min    Activity Tolerance Patient tolerated treatment well    Behavior During Therapy [facility] for tasks assessed/performed           Past Medical History:  Diagnosis Date  . Cataract    both eyes rt. eye was removed  . Chest pain    stress test @ [facility] cardiology (-)  . Colonic polyp    bx adenomatous polyps, next scope 2010  . COPD (chronic obstructive pulmonary disease) ([place])   . Fibromyalgia    see rheumatology  . Glaucoma    not glaucoma surgically removed growths on cornea  . Hypothyroidism   . Migraine   . 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 Osteoporosis    osteopenia  . Pleuritic chest pain    etiology unclear, has beeb eval by pulmonary and rheumatology (autoimmune dz?)  . Wrist fracture 2018    Past Surgical History:  Procedure Laterality Date  . ANTERIOR LAT LUMBAR FUSION N/A 12/03/2019   Procedure: ANTERIOR LATERAL LUMBAR FUSION L2-4;  Surgeon: Melina Schools, MD;  Location: [place];  Service: Orthopedics;  Laterality: N/A;  4 hrs  . COSMETIC SURGERY  04/12/2018   L upper eyelid ptosis repair, and trichophytic brow lift  . ESOPHAGOGASTRODUODENOSCOPY (EGD) WITH PROPOFOL N/A 03/27/2013   Procedure: ESOPHAGOGASTRODUODENOSCOPY (EGD) WITH PROPOFOL;  Surgeon: Milus Banister, MD;  Location: [facility];  Service: Endoscopy;  Laterality: N/A;  possible dil  . 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  .  plastic surgery face  2013  . POLYPECTOMY    . right rotator cuff repair  2010  . TONSILLECTOMY      There were no vitals filed for this visit.   Subjective Assessment - 08/12/20 1318    Subjective Pain has been picking up but "the exercises are helping my mobility".    Pertinent History wrist fx, pleuritic chest pain, osteoporosis, migraine, hypothyroidism, glaucoma, fibromyalgia, R RTC repair, anterior lateral lumbar fusion L2-4 2021    Diagnostic tests none recent    Patient Stated Goals decrease pain    Currently in Pain? Yes    Pain Score 5     Pain Location Back    Pain Orientation Lower    Pain Descriptors / Indicators --   pinching where surgery was done   Pain Type Chronic pain              [facility] PT Assessment - 08/12/20 0001      Observation/Other Assessments   Focus on Therapeutic Outcomes (FOTO)  Lumbar: 44, Predicted: 28                         [facility] Adult PT Treatment/Exercise - 08/12/20 0001      Exercises   Exercises Lumbar;Shoulder  Lumbar Exercises: Stretches   Other Lumbar Stretch Exercise prayer stretch 3x10"      Lumbar Exercises: Aerobic   Nustep L2x6min      Lumbar Exercises: Standing   Heel Raises 10 reps    Heel Raises Limitations with toe raises; 2 HHA    Row Strengthening;Both;10 reps;Theraband    Theraband Level (Row) Level 2 (Red)    Shoulder Extension Strengthening;Both;10 reps;Theraband    Theraband Level (Shoulder Extension) Level 2 (Red)    Shoulder Extension Limitations cues for abdominal bracing    Other Standing Lumbar Exercises marches with 2 HHA 10 reps B      Lumbar Exercises: Seated   Other Seated Lumbar Exercises prayer stretch 3 way 5x5"      Shoulder Exercises: Seated   Flexion Strengthening;Both;10 reps;Weights    Flexion Weight (lbs) 1    Abduction Strengthening;Both;10 reps;Weights    ABduction Weight (lbs) 1                  PT Education - 08/12/20 1403    Education Details HEP  update: Access Code: [email or web address]    Person(s) Educated Patient    Methods Explanation;Demonstration;Verbal cues;Handout    Comprehension Verbalized understanding;Returned demonstration;Verbal cues required;Need further instruction            PT Short Term Goals - 08/12/20 1333      PT SHORT TERM GOAL #1   Title Patient to be independent with initial HEP.    Time 3    Period Weeks    Status On-going    Target Date 08/31/20             PT Long Term Goals - 08/12/20 1333      PT LONG TERM GOAL #1   Title Patient to be independent with advanced HEP.    Time 8    Period Weeks    Status On-going      PT LONG TERM GOAL #2   Title Patient to demonstrate functional and pain-free lumbar AROM.    Time 8    Period Weeks    Status On-going      PT LONG TERM GOAL #3   Title Patient to demonstrate >=4+/5 strength in B LEs and B shoulders.    Time 8    Period Weeks    Status On-going      PT LONG TERM GOAL #4   Title Patient to report tolerance to don socks/shoes without pain limiting.    Time 8    Period Weeks    Status On-going      PT LONG TERM GOAL #5   Title Patient to report tolerance for 1 hour of sitting while doing puzzles without pain.    Time 8    Period Weeks    Status On-going                 Plan - 08/12/20 1405    Clinical Impression Statement Pt arrived into clinic with [facility] and rounded shoulders. She reported increased stiffness with L side bend stretch with pball. Had mild report of pain with prayer stretch on mat table and had to decreased hold duration d/t this. All other exercises went well only with reports of "feeling it in the muscles targeted". Educated her on ideal posture, keeping the shoulders depressed and retracted at rest and keeping trunk upright. Also did rows and extension, along with utilizing pool noodle for postural re-ed and cues given to correctly retract shoulders and brace  TrA.    Personal Factors and Comorbidities Age;Comorbidity  3+;Fitness;Past/Current Experience;Time since onset of injury/illness/exacerbation    Comorbidities wrist fx, pleuritic chest pain, osteoporosis, migraine, hypothyroidism, glaucoma, fibromyalgia, R RTC repair, anterior lateral lumbar fusion L2-4 2021    PT Frequency 2x / week    PT Duration 8 weeks    PT Treatment/Interventions ADLs/Self Care Home Management;Cryotherapy;Electrical Stimulation;Moist Heat;Balance training;Therapeutic exercise;Therapeutic activities;Functional mobility training;Stair training;Gait training;Ultrasound;Neuromuscular re-education;Patient/family education;Manual techniques;Vasopneumatic Device;Taping;Energy conservation;Dry needling;Passive range of motion    PT Next Visit Plan HEP review: progress R UE and LE strength, lumbopelvic mobility    Consulted and Agree with Plan of Care Patient           Patient will benefit from skilled therapeutic intervention in order to improve the following deficits and impairments:  Abnormal gait,Hypomobility,Decreased activity tolerance,Decreased strength,Pain,Increased fascial restricitons,Decreased balance,Difficulty walking,Increased muscle spasms,Improper body mechanics,Decreased range of motion,Impaired flexibility,Postural dysfunction  Visit Diagnosis: Pain in thoracic spine  Chronic midline low back pain without sciatica  Muscle weakness (generalized)  Other abnormalities of gait and mobility  Difficulty in walking, not elsewhere classified     Problem List Patient Active Problem List   Diagnosis Date Noted  . Fusion of lumbar spine 12/03/2019  . COPD (chronic obstructive pulmonary disease) ([place]) 08/22/2019  . Spinal stenosis of lumbar region with neurogenic claudication 11/22/2018  . Coronary artery disease due to lipid rich plaque 10/03/2018  . Closed fracture of distal end of radius 04/16/2017  . Chronic back pain 03/15/2017  . PCP NOTES >>>>>>>>>>>>>>>>>>>> 05/24/2015  . Numbness and tingling of right arm  05/22/2015  . Insomnia 09/01/2014  . GERD (gastroesophageal reflux disease) 03/27/2013  . Dysphagia, pharyngoesophageal phase 03/27/2013  . Annual physical exam 06/12/2011  . Sinusitis, chronic 02/02/2011  . Osteoporosis 06/08/2010  . Fibromyalgia, pain mngmt 01/29/2009  . LIPOMA 09/30/2008  . Migraine without aura 03/04/2007  . Hypothyroidism 05/15/2006  . COLONIC POLYPS, HX OF 05/15/2006    Artist Pais, PTA 08/12/2020, 2:14 PM  [facility] [address]  [place], [place], [address] Phone: [phone]   Fax:  [phone]  Name: JUANDA LUBA MRN: [id number] Date of Birth: 1945-11-28

## 2020-08-12 NOTE — Patient Instructions (Addendum)
[name] , Thank you for taking time to complete your Medicare Wellness Visit. I appreciate your ongoing commitment to your health goals. Please review the following plan we discussed and let me know if I can assist you in the future.   Screening recommendations/referrals: Colonoscopy: Scheduled for 09/03/20 Mammogram: Completed 03/22/2020-Due 03/22/2021 Bone Density: Completed 02/24/2019-Due 02/23/2021 Recommended yearly ophthalmology/optometry visit for glaucoma screening and checkup Recommended yearly dental visit for hygiene and checkup  Vaccinations: Influenza vaccine: Up to date Pneumococcal vaccine: Completed vaccines Tdap vaccine: Up to date-Due 09/07/2025 Shingles vaccine: Completed vaccines   Covid-19:Up to date  Advanced directives: Copy in chart  Conditions/risks identified: See problem list  Next appointment: Follow up in one year for your annual wellness visit 08/18/2021 @ 8:20   Preventive Care 75 Years and Older, Female Preventive care refers to lifestyle choices and visits with your health care provider that can promote health and wellness. What does preventive care include?  A yearly physical exam. This is also called an annual well check.  Dental exams once or twice a year.  Routine eye exams. Ask your health care provider how often you should have your eyes checked.  Personal lifestyle choices, including:  Daily care of your teeth and gums.  Regular physical activity.  Eating a healthy diet.  Avoiding tobacco and drug use.  Limiting alcohol use.  Practicing safe sex.  Taking low-dose aspirin every day.  Taking vitamin and mineral supplements as recommended by your health care provider. What happens during an annual well check? The services and screenings done by your health care provider during your annual well check will depend on your age, overall health, lifestyle risk factors, and family history of disease. Counseling  Your health care  provider may ask you questions about your:  Alcohol use.  Tobacco use.  Drug use.  Emotional well-being.  Home and relationship well-being.  Sexual activity.  Eating habits.  History of falls.  Memory and ability to understand (cognition).  Work and work Statistician.  Reproductive health. Screening  You may have the following tests or measurements:  Height, weight, and BMI.  Blood pressure.  Lipid and cholesterol levels. These may be checked every 5 years, or more frequently if you are over 88 years old.  Skin check.  Lung cancer screening. You may have this screening every year starting at age 68 if you have a 30-pack-year history of smoking and currently smoke or have quit within the past 15 years.  Fecal occult blood test (FOBT) of the stool. You may have this test every year starting at age 33.  Flexible sigmoidoscopy or colonoscopy. You may have a sigmoidoscopy every 5 years or a colonoscopy every 10 years starting at age 39.  Hepatitis C blood test.  Hepatitis B blood test.  Sexually transmitted disease (STD) testing.  Diabetes screening. This is done by checking your blood sugar (glucose) after you have not eaten for a while (fasting). You may have this done every 1-3 years.  Bone density scan. This is done to screen for osteoporosis. You may have this done starting at age 11.  Mammogram. This may be done every 1-2 years. Talk to your health care provider about how often you should have regular mammograms. Talk with your health care provider about your test results, treatment options, and if necessary, the need for more tests. Vaccines  Your health care provider may recommend certain vaccines, such as:  Influenza vaccine. This is recommended every year.  Tetanus, diphtheria, and  acellular pertussis (Tdap, Td) vaccine. You may need a Td booster every 10 years.  Zoster vaccine. You may need this after age 71.  Pneumococcal 13-valent conjugate (PCV13)  vaccine. One dose is recommended after age 60.  Pneumococcal polysaccharide (PPSV23) vaccine. One dose is recommended after age 45. Talk to your health care provider about which screenings and vaccines you need and how often you need them. This information is not intended to replace advice given to you by your health care provider. Make sure you discuss any questions you have with your health care provider. Document Released: 04/23/2015 Document Revised: 12/15/2015 Document Reviewed: 01/26/2015 Elsevier Interactive Patient Education  2017 [place] Prevention in the Home Falls can cause injuries. They can happen to people of all ages. There are many things you can do to make your home safe and to help prevent falls. What can I do on the outside of my home?  Regularly fix the edges of walkways and driveways and fix any cracks.  Remove anything that might make you trip as you walk through a door, such as a raised step or threshold.  Trim any bushes or trees on the path to your home.  Use bright outdoor lighting.  Clear any walking paths of anything that might make someone trip, such as rocks or tools.  Regularly check to see if handrails are loose or broken. Make sure that both sides of any steps have handrails.  Any raised decks and porches should have guardrails on the edges.  Have any leaves, snow, or ice cleared regularly.  Use sand or salt on walking paths during winter.  Clean up any spills in your garage right away. This includes oil or grease spills. What can I do in the bathroom?  Use night lights.  Install grab bars by the toilet and in the tub and shower. Do not use towel bars as grab bars.  Use non-skid mats or decals in the tub or shower.  If you need to sit down in the shower, use a plastic, non-slip stool.  Keep the floor dry. Clean up any water that spills on the floor as soon as it happens.  Remove soap buildup in the tub or shower  regularly.  Attach bath mats securely with double-sided non-slip rug tape.  Do not have throw rugs and other things on the floor that can make you trip. What can I do in the bedroom?  Use night lights.  Make sure that you have a light by your bed that is easy to reach.  Do not use any sheets or blankets that are too big for your bed. They should not hang down onto the floor.  Have a firm chair that has side arms. You can use this for support while you get dressed.  Do not have throw rugs and other things on the floor that can make you trip. What can I do in the kitchen?  Clean up any spills right away.  Avoid walking on wet floors.  Keep items that you use a lot in easy-to-reach places.  If you need to reach something above you, use a strong step stool that has a grab bar.  Keep electrical cords out of the way.  Do not use floor polish or wax that makes floors slippery. If you must use wax, use non-skid floor wax.  Do not have throw rugs and other things on the floor that can make you trip. What can I do with my stairs?  Do not leave any items on the stairs.  Make sure that there are handrails on both sides of the stairs and use them. Fix handrails that are broken or loose. Make sure that handrails are as long as the stairways.  Check any carpeting to make sure that it is firmly attached to the stairs. Fix any carpet that is loose or worn.  Avoid having throw rugs at the top or bottom of the stairs. If you do have throw rugs, attach them to the floor with carpet tape.  Make sure that you have a light switch at the top of the stairs and the bottom of the stairs. If you do not have them, ask someone to add them for you. What else can I do to help prevent falls?  Wear shoes that:  Do not have high heels.  Have rubber bottoms.  Are comfortable and fit you well.  Are closed at the toe. Do not wear sandals.  If you use a stepladder:  Make sure that it is fully  opened. Do not climb a closed stepladder.  Make sure that both sides of the stepladder are locked into place.  Ask someone to hold it for you, if possible.  Clearly mark and make sure that you can see:  Any grab bars or handrails.  First and last steps.  Where the edge of each step is.  Use tools that help you move around (mobility aids) if they are needed. These include:  Canes.  Walkers.  Scooters.  Crutches.  Turn on the lights when you go into a dark area. Replace any light bulbs as soon as they burn out.  Set up your furniture so you have a clear path. Avoid moving your furniture around.  If any of your floors are uneven, fix them.  If there are any pets around you, be aware of where they are.  Review your medicines with your doctor. Some medicines can make you feel dizzy. This can increase your chance of falling. Ask your doctor what other things that you can do to help prevent falls. This information is not intended to replace advice given to you by your health care provider. Make sure you discuss any questions you have with your health care provider. Document Released: 01/21/2009 Document Revised: 09/02/2015 Document Reviewed: 05/01/2014 Elsevier Interactive Patient Education  2017 [employer].

## 2020-08-13 ENCOUNTER — Other Ambulatory Visit: Payer: Self-pay | Admitting: Internal Medicine

## 2020-08-16 ENCOUNTER — Encounter: Payer: Self-pay | Admitting: Rehabilitative and Restorative Service Providers"

## 2020-08-16 ENCOUNTER — Ambulatory Visit: Payer: Medicare HMO | Admitting: Rehabilitative and Restorative Service Providers"

## 2020-08-16 ENCOUNTER — Other Ambulatory Visit: Payer: Self-pay

## 2020-08-16 DIAGNOSIS — M545 Low back pain, unspecified: Secondary | ICD-10-CM | POA: Diagnosis not present

## 2020-08-16 DIAGNOSIS — M546 Pain in thoracic spine: Secondary | ICD-10-CM

## 2020-08-16 DIAGNOSIS — M6281 Muscle weakness (generalized): Secondary | ICD-10-CM | POA: Diagnosis not present

## 2020-08-16 DIAGNOSIS — R2689 Other abnormalities of gait and mobility: Secondary | ICD-10-CM

## 2020-08-16 DIAGNOSIS — R262 Difficulty in walking, not elsewhere classified: Secondary | ICD-10-CM | POA: Diagnosis not present

## 2020-08-16 DIAGNOSIS — G8929 Other chronic pain: Secondary | ICD-10-CM | POA: Diagnosis not present

## 2020-08-16 NOTE — Therapy (Signed)
[facility] [address]  [place], [place], [address] Phone: [phone]   Fax:  [phone]  Physical Therapy Treatment  Patient Details  Name: [name] MRN: [id number] Date of Birth: December 08, 1945 Referring Provider (PT): Kathlene November, MD   Encounter Date: 08/16/2020   PT End of Session - 08/16/20 1230    Visit Number 3    Number of Visits 17    Date for PT Re-Evaluation 10/05/20    Authorization Type [employer]    PT Start Time 9678    PT Stop Time 1225    PT Time Calculation (min) 40 min    Activity Tolerance Patient tolerated treatment well    Behavior During Therapy [facility] for tasks assessed/performed           Past Medical History:  Diagnosis Date  . Cataract    both eyes rt. eye was removed  . Chest pain    stress test @ [facility] cardiology (-)  . Colonic polyp    bx adenomatous polyps, next scope 2010  . COPD (chronic obstructive pulmonary disease) ([place])   . Fibromyalgia    see rheumatology  . Glaucoma    not glaucoma surgically removed growths on cornea  . Hypothyroidism   . Migraine   . 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 Osteoporosis    osteopenia  . Pleuritic chest pain    etiology unclear, has beeb eval by pulmonary and rheumatology (autoimmune dz?)  . Wrist fracture 2018    Past Surgical History:  Procedure Laterality Date  . ANTERIOR LAT LUMBAR FUSION N/A 12/03/2019   Procedure: ANTERIOR LATERAL LUMBAR FUSION L2-4;  Surgeon: Melina Schools, MD;  Location: [facility];  Service: Orthopedics;  Laterality: N/A;  4 hrs  . COSMETIC SURGERY  04/12/2018   L upper eyelid ptosis repair, and trichophytic brow lift  . ESOPHAGOGASTRODUODENOSCOPY (EGD) WITH PROPOFOL N/A 03/27/2013   Procedure: ESOPHAGOGASTRODUODENOSCOPY (EGD) WITH PROPOFOL;  Surgeon: Milus Banister, MD;  Location: [facility];  Service: Endoscopy;  Laterality: N/A;  possible dil  . 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  .  plastic surgery face  2013  . POLYPECTOMY    . right rotator cuff repair  2010  . TONSILLECTOMY      There were no vitals filed for this visit.   Subjective Assessment - 08/16/20 1149    Subjective I think that I stretched a little too much with one of the exercises and I hurt my R knee some.    Pertinent History wrist fx, pleuritic chest pain, osteoporosis, migraine, hypothyroidism, glaucoma, fibromyalgia, R RTC repair, anterior lateral lumbar fusion L2-4 2021    Limitations Lifting;Standing;Walking;House hold activities;Sitting    How long can you sit comfortably? 1 hour on couch- weakness upon standing    How long can you stand comfortably? 30 min with grocery cart, 15 min without    How long can you walk comfortably? 30 min with grocery cart, 15 min without    Diagnostic tests none recent    Patient Stated Goals decrease pain    Pain Score 4     Pain Location Back    Pain Orientation Lower    Pain Descriptors / Indicators Aching    Pain Type Chronic pain                             [facility] Adult  PT Treatment/Exercise - 08/16/20 0001      High Level Balance   High Level Balance Comments Standing on Air Ex with eyes open and eyes closed.  Standing on AirEx in semitandem position bilat directions 20 sec x2 each.  Marching on AirEx x10 reps.      Lumbar Exercises: Stretches   Other Lumbar Stretch Exercise prayer stretch 3 way 5x5"      Lumbar Exercises: Aerobic   Nustep L3x66min      Lumbar Exercises: Standing   Heel Raises 10 reps    Heel Raises Limitations with toe raises; 2 HHA    Other Standing Lumbar Exercises marches with unilateral HHA 10 reps B      Lumbar Exercises: Seated   Sit to Stand 10 reps    Sit to Stand Limitations pushing up through thighs    Other Seated Lumbar Exercises lumbar stretch pushing out orange Joffe x10 reps      Shoulder Exercises: Seated   Flexion Strengthening;Both;10 reps;Weights    Flexion Weight (lbs) 2    Abduction  Strengthening;Both;10 reps;Weights    ABduction Weight (lbs) 2      Shoulder Exercises: ROM/Strengthening   Lat Pull 1 plate;20 reps    Cybex Press 1 plate;10 reps    Cybex Row 2 plate;20 reps                    PT Short Term Goals - 08/12/20 1333      PT SHORT TERM GOAL #1   Title Patient to be independent with initial HEP.    Time 3    Period Weeks    Status On-going    Target Date 08/31/20             PT Long Term Goals - 08/12/20 1333      PT LONG TERM GOAL #1   Title Patient to be independent with advanced HEP.    Time 8    Period Weeks    Status On-going      PT LONG TERM GOAL #2   Title Patient to demonstrate functional and pain-free lumbar AROM.    Time 8    Period Weeks    Status On-going      PT LONG TERM GOAL #3   Title Patient to demonstrate >=4+/5 strength in B LEs and B shoulders.    Time 8    Period Weeks    Status On-going      PT LONG TERM GOAL #4   Title Patient to report tolerance to don socks/shoes without pain limiting.    Time 8    Period Weeks    Status On-going      PT LONG TERM GOAL #5   Title Patient to report tolerance for 1 hour of sitting while doing puzzles without pain.    Time 8    Period Weeks    Status On-going                 Plan - 08/16/20 1231    Clinical Impression Statement Pt tolerated session well.  Performed Cybex machines instead of theraband and pt stated that she felt that she gained more from that.  Pt was able to increase shoulder flexion exercise to 2#.  Added balance on airex foam and pt had more difficulty with eyes closed and required close SBA/CGA to maintain balance.  Pt without reports of increased pain throughout session and continues to require skilled PT to progress towards goal  related activities.    Personal Factors and Comorbidities Age;Comorbidity 3+;Fitness;Past/Current Experience;Time since onset of injury/illness/exacerbation    Comorbidities wrist fx, pleuritic chest pain,  osteoporosis, migraine, hypothyroidism, glaucoma, fibromyalgia, R RTC repair, anterior lateral lumbar fusion L2-4 2021    Examination-Activity Limitations Bed Mobility;Sleep;Sit;Bend;Squat;Stairs;Carry;Stand;Toileting;Dressing;Transfers;Hygiene/Grooming;Lift;Locomotion Level;Reach Overhead    PT Treatment/Interventions ADLs/Self Care Home Management;Cryotherapy;Electrical Stimulation;Moist Heat;Balance training;Therapeutic exercise;Therapeutic activities;Functional mobility training;Stair training;Gait training;Ultrasound;Neuromuscular re-education;Patient/family education;Manual techniques;Vasopneumatic Device;Taping;Energy conservation;Dry needling;Passive range of motion    PT Next Visit Plan HEP review: progress R UE and LE strength, lumbopelvic mobility    Consulted and Agree with Plan of Care Patient           Patient will benefit from skilled therapeutic intervention in order to improve the following deficits and impairments:  Abnormal gait,Hypomobility,Decreased activity tolerance,Decreased strength,Pain,Increased fascial restricitons,Decreased balance,Difficulty walking,Increased muscle spasms,Improper body mechanics,Decreased range of motion,Impaired flexibility,Postural dysfunction  Visit Diagnosis: Pain in thoracic spine  Chronic midline low back pain without sciatica  Muscle weakness (generalized)  Other abnormalities of gait and mobility     Problem List Patient Active Problem List   Diagnosis Date Noted  . Fusion of lumbar spine 12/03/2019  . COPD (chronic obstructive pulmonary disease) ([place]) 08/22/2019  . Spinal stenosis of lumbar region with neurogenic claudication 11/22/2018  . Coronary artery disease due to lipid rich plaque 10/03/2018  . Closed fracture of distal end of radius 04/16/2017  . Chronic back pain 03/15/2017  . PCP NOTES >>>>>>>>>>>>>>>>>>>> 05/24/2015  . Numbness and tingling of right arm 05/22/2015  . Insomnia 09/01/2014  . GERD (gastroesophageal  reflux disease) 03/27/2013  . Dysphagia, pharyngoesophageal phase 03/27/2013  . Annual physical exam 06/12/2011  . Sinusitis, chronic 02/02/2011  . Osteoporosis 06/08/2010  . Fibromyalgia, pain mngmt 01/29/2009  . LIPOMA 09/30/2008  . Migraine without aura 03/04/2007  . Hypothyroidism 05/15/2006  . COLONIC POLYPS, HX OF 05/15/2006    Juel Burrow, PT, DPT 08/16/2020, 12:35 PM  [facility] [address]  [place], [place], [address] Phone: [phone]   Fax:  [phone]  Name: [name] MRN: [id number] Date of Birth: [birth year]

## 2020-08-19 ENCOUNTER — Encounter: Payer: Self-pay | Admitting: Physical Therapy

## 2020-08-19 ENCOUNTER — Other Ambulatory Visit: Payer: Self-pay

## 2020-08-19 ENCOUNTER — Ambulatory Visit: Payer: Medicare HMO | Admitting: Physical Therapy

## 2020-08-19 DIAGNOSIS — R262 Difficulty in walking, not elsewhere classified: Secondary | ICD-10-CM | POA: Diagnosis not present

## 2020-08-19 DIAGNOSIS — M546 Pain in thoracic spine: Secondary | ICD-10-CM | POA: Diagnosis not present

## 2020-08-19 DIAGNOSIS — M545 Low back pain, unspecified: Secondary | ICD-10-CM

## 2020-08-19 DIAGNOSIS — G8929 Other chronic pain: Secondary | ICD-10-CM | POA: Diagnosis not present

## 2020-08-19 DIAGNOSIS — R2689 Other abnormalities of gait and mobility: Secondary | ICD-10-CM

## 2020-08-19 DIAGNOSIS — M6281 Muscle weakness (generalized): Secondary | ICD-10-CM

## 2020-08-19 NOTE — Therapy (Signed)
[facility] [address]  [place], [place], [address] Phone: [phone]   Fax:  [phone]  Physical Therapy Treatment  Patient Details  Name: [name] MRN: [id number] Date of Birth: Jun 21, 1945 Referring Provider (PT): Kathlene November, MD   Encounter Date: 08/19/2020   PT End of Session - 08/19/20 1530    Visit Number 4    Number of Visits 17    Date for PT Re-Evaluation 10/05/20    Authorization Type Aetna Medicare    PT Start Time 2725    PT Stop Time 1542    PT Time Calculation (min) 57 min    Activity Tolerance Patient tolerated treatment well;Patient limited by pain;Patient limited by fatigue    Behavior During Therapy [facility] for tasks assessed/performed           Past Medical History:  Diagnosis Date  . Cataract    both eyes rt. eye was removed  . Chest pain    stress test @ [facility] cardiology (-)  . Colonic polyp    bx adenomatous polyps, next scope 2010  . COPD (chronic obstructive pulmonary disease) ([place])   . Fibromyalgia    see rheumatology  . Glaucoma    not glaucoma surgically removed growths on cornea  . Hypothyroidism   . Migraine   . 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 Osteoporosis    osteopenia  . Pleuritic chest pain    etiology unclear, has beeb eval by pulmonary and rheumatology (autoimmune dz?)  . Wrist fracture 2018    Past Surgical History:  Procedure Laterality Date  . ANTERIOR LAT LUMBAR FUSION N/A 12/03/2019   Procedure: ANTERIOR LATERAL LUMBAR FUSION L2-4;  Surgeon: Melina Schools, MD;  Location: [place];  Service: Orthopedics;  Laterality: N/A;  4 hrs  . COSMETIC SURGERY  04/12/2018   L upper eyelid ptosis repair, and trichophytic brow lift  . ESOPHAGOGASTRODUODENOSCOPY (EGD) WITH PROPOFOL N/A 03/27/2013   Procedure: ESOPHAGOGASTRODUODENOSCOPY (EGD) WITH PROPOFOL;  Surgeon: Milus Banister, MD;  Location: [facility];  Service: Endoscopy;  Laterality: N/A;  possible dil   . 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  . plastic surgery face  2013  . POLYPECTOMY    . right rotator cuff repair  2010  . TONSILLECTOMY      There were no vitals filed for this visit.   Subjective Assessment - 08/19/20 1444    Subjective Stopped doing her LAQ exercise because it hurt her knee. Feeling kind of weak in her R leg today.    Pertinent History wrist fx, pleuritic chest pain, osteoporosis, migraine, hypothyroidism, glaucoma, fibromyalgia, R RTC repair, anterior lateral lumbar fusion L2-4 2021    Diagnostic tests none recent    Patient Stated Goals decrease pain    Currently in Pain? Yes    Pain Score 4     Pain Location Back    Pain Orientation Lower    Pain Descriptors / Indicators Aching    Pain Type Chronic pain                             [facility] Adult PT Treatment/Exercise - 08/19/20 0001      Lumbar Exercises: Stretches   Lower Trunk Rotation Limitations 10x to tolerance    ITB Stretch Right;2 reps;30 seconds    ITB Stretch Limitations supine wth strap      Lumbar Exercises:  Aerobic   Nustep L3x49min (UEs/LEs)      Lumbar Exercises: Standing   Other Standing Lumbar Exercises R TKE with blue TB and chair support 10x3"      Lumbar Exercises: Supine   Straight Leg Raise 5 reps    Straight Leg Raises Limitations 2x5   c/o difficulty; limited ROM     Lumbar Exercises: Sidelying   Other Sidelying Lumbar Exercises R/L open book stretch 10x to tolerance      Shoulder Exercises: Seated   Flexion Strengthening;10 reps;Weights;Right    Theraband Level (Shoulder Flexion) Level 1 (Yellow)    Flexion Limitations cues to maintain elbow straight    Abduction Strengthening;Both;10 reps;Weights;Right    Theraband Level (Shoulder ABduction) Level 1 (Yellow)    ABduction Weight (lbs) cues to depress shoulder      Modalities   Modalities Electrical Stimulation      Electrical Stimulation   Electrical Stimulation Location B lumbar paraspinals     Electrical Stimulation Action IFC    Electrical Stimulation Parameters output to tolerance; 15 min   discontinued 2 min early d/t discomfort on lower R electrode- no skin irritation present   Electrical Stimulation Goals Pain                  PT Education - 08/19/20 1530    Education Details update to HEP    Person(s) Educated Patient    Methods Explanation;Demonstration;Tactile cues;Verbal cues;Handout    Comprehension Verbalized understanding;Returned demonstration            PT Short Term Goals - 08/19/20 1532      PT SHORT TERM GOAL #1   Title Patient to be independent with initial HEP.    Time 3    Period Weeks    Status Achieved    Target Date 08/31/20             PT Long Term Goals - 08/12/20 1333      PT LONG TERM GOAL #1   Title Patient to be independent with advanced HEP.    Time 8    Period Weeks    Status On-going      PT LONG TERM GOAL #2   Title Patient to demonstrate functional and pain-free lumbar AROM.    Time 8    Period Weeks    Status On-going      PT LONG TERM GOAL #3   Title Patient to demonstrate >=4+/5 strength in B LEs and B shoulders.    Time 8    Period Weeks    Status On-going      PT LONG TERM GOAL #4   Title Patient to report tolerance to don socks/shoes without pain limiting.    Time 8    Period Weeks    Status On-going      PT LONG TERM GOAL #5   Title Patient to report tolerance for 1 hour of sitting while doing puzzles without pain.    Time 8    Period Weeks    Status On-going                 Plan - 08/19/20 1531    Clinical Impression Statement Patient arrived to session with c/o R knee pain from LAQ, thus reports discontinuing this exercise at home. Also notes sensation of weakness in the R LE today. Replaced LAQ with banded TKE as with was better tolerated. Also initiated SLR for hip flexor and quad strength which patient was able to  perform despite c/o difficulty and visible hip flexor fatigue.  Patient with c/o pain over the R lateral hip, thus worked on TFL stretching with patient reporting good benefit. Gentle thoracolumbar ROM was performed with good tolerance. Updated HEP with exercises that were well-tolerated today- patient reported understanding. Ended session with e-stim to LB for post-exercise soreness. Patient reported no painn at end of session.    Personal Factors and Comorbidities Age;Comorbidity 3+;Fitness;Past/Current Experience;Time since onset of injury/illness/exacerbation    Comorbidities wrist fx, pleuritic chest pain, osteoporosis, migraine, hypothyroidism, glaucoma, fibromyalgia, R RTC repair, anterior lateral lumbar fusion L2-4 2021    Examination-Activity Limitations Bed Mobility;Sleep;Sit;Bend;Squat;Stairs;Carry;Stand;Toileting;Dressing;Transfers;Hygiene/Grooming;Lift;Locomotion Level;Reach Overhead    PT Treatment/Interventions ADLs/Self Care Home Management;Cryotherapy;Electrical Stimulation;Moist Heat;Balance training;Therapeutic exercise;Therapeutic activities;Functional mobility training;Stair training;Gait training;Ultrasound;Neuromuscular re-education;Patient/family education;Manual techniques;Vasopneumatic Device;Taping;Energy conservation;Dry needling;Passive range of motion    PT Next Visit Plan progress R UE and LE strength, lumbopelvic mobility    Consulted and Agree with Plan of Care Patient           Patient will benefit from skilled therapeutic intervention in order to improve the following deficits and impairments:  Abnormal gait,Hypomobility,Decreased activity tolerance,Decreased strength,Pain,Increased fascial restricitons,Decreased balance,Difficulty walking,Increased muscle spasms,Improper body mechanics,Decreased range of motion,Impaired flexibility,Postural dysfunction  Visit Diagnosis: Pain in thoracic spine  Chronic midline low back pain without sciatica  Muscle weakness (generalized)  Other abnormalities of gait and  mobility  Difficulty in walking, not elsewhere classified     Problem List Patient Active Problem List   Diagnosis Date Noted  . Fusion of lumbar spine 12/03/2019  . COPD (chronic obstructive pulmonary disease) ([place]) 08/22/2019  . Spinal stenosis of lumbar region with neurogenic claudication 11/22/2018  . Coronary artery disease due to lipid rich plaque 10/03/2018  . Closed fracture of distal end of radius 04/16/2017  . Chronic back pain 03/15/2017  . PCP NOTES >>>>>>>>>>>>>>>>>>>> 05/24/2015  . Numbness and tingling of right arm 05/22/2015  . Insomnia 09/01/2014  . GERD (gastroesophageal reflux disease) 03/27/2013  . Dysphagia, pharyngoesophageal phase 03/27/2013  . Annual physical exam 06/12/2011  . Sinusitis, chronic 02/02/2011  . Osteoporosis 06/08/2010  . Fibromyalgia, pain mngmt 01/29/2009  . LIPOMA 09/30/2008  . Migraine without aura 03/04/2007  . Hypothyroidism 05/15/2006  . COLONIC POLYPS, HX OF 05/15/2006     Janene Harvey, PT, DPT 08/19/20 3:45 PM   [facility] [address]  [place], [place], [address] Phone: [phone]   Fax:  [phone]  Name: OLENE GODFREY MRN: [id number] Date of Birth: December 25, 1945

## 2020-08-20 ENCOUNTER — Other Ambulatory Visit: Payer: Self-pay

## 2020-08-20 ENCOUNTER — Ambulatory Visit (AMBULATORY_SURGERY_CENTER): Payer: Medicare HMO | Admitting: *Deleted

## 2020-08-20 VITALS — Ht 65.0 in | Wt 187.0 lb

## 2020-08-20 DIAGNOSIS — Z86010 Personal history of colonic polyps: Secondary | ICD-10-CM

## 2020-08-20 NOTE — Progress Notes (Signed)
No egg or soy allergy known to patient  No issues with past sedation with any surgeries or procedures Patient denies ever being told they had issues or difficulty with intubation  No FH of Malignant Hyperthermia No diet pills per patient No home 02 use per patient  No blood thinners per patient  Pt denies issues with constipation  No A fib or A flutter  EMMI video to pt or via MyChart  COVID 19 guidelines implemented in PV today with Pt and RN  Pt is fully vaccinated  for Covid     Due to the COVID-19 pandemic we are asking patients to follow certain guidelines.  Pt aware of COVID protocols and [facility] guidelines   Pt verified name, DOB, address and insurance during PV today. Pt mailed instruction packet to included paper to complete and mail back to [facility] with addressed and stamped envelope, Emmi video, copy of consent form to read and not return, and instructions.   PV completed over the phone. Pt encouraged to call with questions or issues. My Chart instructions to pt as well    

## 2020-08-23 ENCOUNTER — Telehealth: Payer: Self-pay

## 2020-08-23 NOTE — Telephone Encounter (Signed)
Prolia VOB initiated via [email or web address] 

## 2020-08-24 ENCOUNTER — Other Ambulatory Visit: Payer: Self-pay | Admitting: Internal Medicine

## 2020-08-24 ENCOUNTER — Ambulatory Visit: Payer: Medicare HMO

## 2020-08-24 ENCOUNTER — Other Ambulatory Visit: Payer: Self-pay

## 2020-08-24 DIAGNOSIS — G8929 Other chronic pain: Secondary | ICD-10-CM | POA: Diagnosis not present

## 2020-08-24 DIAGNOSIS — R2689 Other abnormalities of gait and mobility: Secondary | ICD-10-CM | POA: Diagnosis not present

## 2020-08-24 DIAGNOSIS — R262 Difficulty in walking, not elsewhere classified: Secondary | ICD-10-CM | POA: Diagnosis not present

## 2020-08-24 DIAGNOSIS — Z4889 Encounter for other specified surgical aftercare: Secondary | ICD-10-CM | POA: Diagnosis not present

## 2020-08-24 DIAGNOSIS — M546 Pain in thoracic spine: Secondary | ICD-10-CM | POA: Diagnosis not present

## 2020-08-24 DIAGNOSIS — M545 Low back pain, unspecified: Secondary | ICD-10-CM | POA: Diagnosis not present

## 2020-08-24 DIAGNOSIS — M6281 Muscle weakness (generalized): Secondary | ICD-10-CM | POA: Diagnosis not present

## 2020-08-24 MED ORDER — HYDROCODONE-ACETAMINOPHEN 7.5-325 MG PO TABS: 1.0000 | ORAL_TABLET | Freq: Four times a day (QID) | ORAL | 0 refills | Status: DC | PRN

## 2020-08-24 NOTE — Telephone Encounter (Signed)
[facility] Pt  Requesting: hydrocodone 7.5-325mg  Contract: 01/29/2020 UDS: 01/29/2020 Last Visit: 07/20/2020 Next Visit: 09/29/2020 Last Refill: 07/26/2020 #120 and 0RF Pt sig: 1 tab q6h prn  Please Advise

## 2020-08-24 NOTE — Therapy (Signed)
[facility] [address]  [place], [place], [address] Phone: [phone]   Fax:  [phone]  Physical Therapy Treatment  Patient Details  Name: [name] MRN: [id number] Date of Birth: 16-Aug-1945 Referring Provider (PT): Kathlene November, MD   Encounter Date: 08/24/2020   PT End of Session - 08/24/20 1441    Visit Number 5    Number of Visits 17    Date for PT Re-Evaluation 10/05/20    Authorization Type Aetna Medicare    PT Start Time 1700    PT Stop Time 1440    PT Time Calculation (min) 42 min    Activity Tolerance Patient tolerated treatment well;Patient limited by pain    Behavior During Therapy [facility] for tasks assessed/performed           Past Medical History:  Diagnosis Date  . Arthritis   . Cataract    both eyes rt. eye was removed  . Chest pain    stress test @ [facility] cardiology (-)  . Colonic polyp    bx adenomatous polyps, next scope 2010  . COPD (chronic obstructive pulmonary disease) ([place])   . 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 Fibromyalgia    see rheumatology  . GERD (gastroesophageal reflux disease)    PAST HX   . Hyperlipidemia   . Hypothyroidism   . Migraine   . 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 Osteoporosis    osteopenia  . Pleuritic chest pain    etiology unclear, has beeb eval by pulmonary and rheumatology (autoimmune dz?)  . Wrist fracture 2018    Past Surgical History:  Procedure Laterality Date  . ANTERIOR LAT LUMBAR FUSION N/A 12/03/2019   Procedure: ANTERIOR LATERAL LUMBAR FUSION L2-4;  Surgeon: Melina Schools, MD;  Location: [place];  Service: Orthopedics;  Laterality: N/A;  4 hrs  . COLONOSCOPY    . COSMETIC SURGERY  04/12/2018   L upper eyelid ptosis repair, and trichophytic brow lift  . ESOPHAGOGASTRODUODENOSCOPY (EGD) WITH PROPOFOL N/A 03/27/2013   Procedure: ESOPHAGOGASTRODUODENOSCOPY (EGD) WITH PROPOFOL;  Surgeon: Milus Banister, MD;  Location:  [facility];  Service: Endoscopy;  Laterality: N/A;  possible dil  . 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  . plastic surgery face  2013  . POLYPECTOMY    . right rotator cuff repair  2010  . TONSILLECTOMY    . UPPER GASTROINTESTINAL ENDOSCOPY      There were no vitals filed for this visit.   Subjective Assessment - 08/24/20 1402    Subjective Pt reports going to the doctor and discussing recieving an injection for her spine with her doctor. Hip was sore after the last session from the exercises doesn't wan to do estim today.    Pertinent History wrist fx, pleuritic chest pain, osteoporosis, migraine, hypothyroidism, glaucoma, fibromyalgia, R RTC repair, anterior lateral lumbar fusion L2-4 2021    Diagnostic tests none recent    Patient Stated Goals decrease pain    Currently in Pain? Yes    Pain Score 5     Pain Location Back    Pain Orientation Right;Lower    Pain Descriptors / Indicators Aching    Pain Type Chronic pain    Pain Radiating Towards lower glutes  [facility] Adult PT Treatment/Exercise - 08/24/20 0001      Exercises   Exercises Lumbar;Shoulder;Knee/Hip      Lumbar Exercises: Aerobic   Nustep L3x89min (UEs/LEs)      Lumbar Exercises: Standing   Row Strengthening;Both;20 reps;Theraband    Theraband Level (Row) Level 3 (Green)    Shoulder Extension Strengthening;Both;10 reps;Theraband    Theraband Level (Shoulder Extension) Level 3 (Green)      Lumbar Exercises: Seated   Long Armed forces operational officer;Both    LAQ on Chair Limitations with Schramm squeeze    Other Seated Lumbar Exercises trunk rotation with yellow TB 10 reps both ways      Knee/Hip Exercises: Stretches   Other Knee/Hip Stretches hip ABD stretch 3x10"      Knee/Hip Exercises: Standing   Hip Abduction Stengthening;Both;10 reps;Knee straight    Abduction Limitations counter support    Other Standing Knee Exercises marches with core activated 10  reps counter support      Shoulder Exercises: Seated   Flexion Strengthening;Both;10 reps;Weights    Flexion Weight (lbs) 1    Abduction Strengthening;Both;10 reps;Weights    ABduction Weight (lbs) 1                    PT Short Term Goals - 08/19/20 1532      PT SHORT TERM GOAL #1   Title Patient to be independent with initial HEP.    Time 3    Period Weeks    Status Achieved    Target Date 08/31/20             PT Long Term Goals - 08/12/20 1333      PT LONG TERM GOAL #1   Title Patient to be independent with advanced HEP.    Time 8    Period Weeks    Status On-going      PT LONG TERM GOAL #2   Title Patient to demonstrate functional and pain-free lumbar AROM.    Time 8    Period Weeks    Status On-going      PT LONG TERM GOAL #3   Title Patient to demonstrate >=4+/5 strength in B LEs and B shoulders.    Time 8    Period Weeks    Status On-going      PT LONG TERM GOAL #4   Title Patient to report tolerance to don socks/shoes without pain limiting.    Time 8    Period Weeks    Status On-going      PT LONG TERM GOAL #5   Title Patient to report tolerance for 1 hour of sitting while doing puzzles without pain.    Time 8    Period Weeks    Status On-going                 Plan - 08/24/20 1442    Clinical Impression Statement Pt reported increased pain after last session from the LE strengthening exercises. Reduced the amount of LE exercises today and kept exercises light d/t this complaint from her. Focused on postural exercises, with cueing needed to depressed shoulders and proper retract the shoulder blades. Did light hip strengthening in standing with cueing for core activation during the exercises. Also reviewed her HEP exercises and gave instruction and modification as needed. She continues to need work on posture d/t kyphosis obeserved during session and hip strength for support of the spine.    Personal Factors and Comorbidities  Age;Comorbidity  3+;Fitness;Past/Current Experience;Time since onset of injury/illness/exacerbation    Comorbidities wrist fx, pleuritic chest pain, osteoporosis, migraine, hypothyroidism, glaucoma, fibromyalgia, R RTC repair, anterior lateral lumbar fusion L2-4 2021    PT Frequency 2x / week    PT Duration 8 weeks    PT Treatment/Interventions ADLs/Self Care Home Management;Cryotherapy;Electrical Stimulation;Moist Heat;Balance training;Therapeutic exercise;Therapeutic activities;Functional mobility training;Stair training;Gait training;Ultrasound;Neuromuscular re-education;Patient/family education;Manual techniques;Vasopneumatic Device;Taping;Energy conservation;Dry needling;Passive range of motion    PT Next Visit Plan progress R UE and LE strength, lumbopelvic mobility    Consulted and Agree with Plan of Care Patient           Patient will benefit from skilled therapeutic intervention in order to improve the following deficits and impairments:  Abnormal gait,Hypomobility,Decreased activity tolerance,Decreased strength,Pain,Increased fascial restricitons,Decreased balance,Difficulty walking,Increased muscle spasms,Improper body mechanics,Decreased range of motion,Impaired flexibility,Postural dysfunction  Visit Diagnosis: Pain in thoracic spine  Chronic midline low back pain without sciatica  Muscle weakness (generalized)  Other abnormalities of gait and mobility  Difficulty in walking, not elsewhere classified     Problem List Patient Active Problem List   Diagnosis Date Noted  . Fusion of lumbar spine 12/03/2019  . COPD (chronic obstructive pulmonary disease) ([place]) 08/22/2019  . Spinal stenosis of lumbar region with neurogenic claudication 11/22/2018  . Coronary artery disease due to lipid rich plaque 10/03/2018  . Closed fracture of distal end of radius 04/16/2017  . Chronic back pain 03/15/2017  . PCP NOTES >>>>>>>>>>>>>>>>>>>> 05/24/2015  . Numbness and tingling of right  arm 05/22/2015  . Insomnia 09/01/2014  . GERD (gastroesophageal reflux disease) 03/27/2013  . Dysphagia, pharyngoesophageal phase 03/27/2013  . Annual physical exam 06/12/2011  . Sinusitis, chronic 02/02/2011  . Osteoporosis 06/08/2010  . Fibromyalgia, pain mngmt 01/29/2009  . LIPOMA 09/30/2008  . Migraine without aura 03/04/2007  . Hypothyroidism 05/15/2006  . COLONIC POLYPS, HX OF 05/15/2006    Artist Pais, PTA 08/24/2020, 3:16 PM  [facility] [address]  [place], [place], [address] Phone: [phone]   Fax:  [phone]  Name: [name] MRN: [id number] Date of Birth: [birth year]

## 2020-08-25 NOTE — Telephone Encounter (Signed)
PRIOR AUTH REQUIRED.

## 2020-08-25 NOTE — Telephone Encounter (Signed)
MEDICAL BENEFIT SUMMARY Patient Out-of-Pocket Responsibility Coverage Available Authorization Required Deductible Co-pay/Coinsurance Prolia OOP COST PHYSICIAN FACILITY FEE ADMIN FEE PURCHASE OR REFERRAL: YES YES PA Required PRIMARY No SECONDARY No 20%* No* 20%* SPECIALTY PHARMACY (via Medical Benefit): NO YES No* No* No* *Reflects patient costs once plan deductible is met. Please see Medical Benefit Details for further information regarding patient costs. Patient costs may vary based on services rendered. BENEFITS VERIFIED FOR THE FOLLOWING DIAGNOSIS AND INSURANCE PLANS Verified for Diagnosis M81.0 Site of Care  MD Office   Policy Level: Primary Policy Status: Active Payer Name: [place] Plan Name: [facility]) Policy Number: [id number] Employer Name: [employer] Plan Type: Group Number: [id number]  Payer Phone: [phone] PRIMARY MEDICAL BENEFIT DETAILS (PHYSICIAN PURCHASE, OR REFERRAL TO TREATING SITE) COVERAGE AVAILABLE: Yes COVERAGE DETAILS: Prolia is subject to 20.0% coinsurance and $4,500.00 out of pocket max ($75.00 met). Once met, Prolia will then be covered at 100%. The benefits provided on this Verification of Benefits form are Medical Benefits and are the patient's In-Network benefits for Prolia. If you would like Pharmacy Benefits for Prolia, please call [phone].  AUTHORIZATION REQUIRED: Yes  PA PROCESS DETAILS: PA is required. Call [phone] or complete the PA form available at [email or web address]

## 2020-08-26 ENCOUNTER — Ambulatory Visit: Payer: Medicare HMO

## 2020-08-26 ENCOUNTER — Other Ambulatory Visit: Payer: Self-pay

## 2020-08-26 DIAGNOSIS — M545 Low back pain, unspecified: Secondary | ICD-10-CM

## 2020-08-26 DIAGNOSIS — R2689 Other abnormalities of gait and mobility: Secondary | ICD-10-CM

## 2020-08-26 DIAGNOSIS — M6281 Muscle weakness (generalized): Secondary | ICD-10-CM | POA: Diagnosis not present

## 2020-08-26 DIAGNOSIS — M546 Pain in thoracic spine: Secondary | ICD-10-CM

## 2020-08-26 DIAGNOSIS — R262 Difficulty in walking, not elsewhere classified: Secondary | ICD-10-CM

## 2020-08-26 DIAGNOSIS — G8929 Other chronic pain: Secondary | ICD-10-CM | POA: Diagnosis not present

## 2020-08-26 NOTE — Therapy (Signed)
[facility] [address]  [place], [place], [address] Phone: [phone]   Fax:  [phone]  Physical Therapy Treatment  Patient Details  Name: [name] MRN: [id number] Date of Birth: [birth year] Referring Provider (PT): Kathlene November, MD   Encounter Date: 08/26/2020   PT End of Session - 08/26/20 1150    Visit Number 5    Number of Visits 17    Date for PT Re-Evaluation 10/05/20    Authorization Type Aetna Medicare    PT Start Time 1101    PT Stop Time 1143    PT Time Calculation (min) 42 min    Activity Tolerance Patient tolerated treatment well    Behavior During Therapy [facility] for tasks assessed/performed           Past Medical History:  Diagnosis Date  . Arthritis   . Cataract    both eyes rt. eye was removed  . Chest pain    stress test @ [facility] cardiology (-)  . Colonic polyp    bx adenomatous polyps, next scope 2010  . COPD (chronic obstructive pulmonary disease) ([place])   . 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 Fibromyalgia    see rheumatology  . GERD (gastroesophageal reflux disease)    PAST HX   . Hyperlipidemia   . Hypothyroidism   . Migraine   . 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 Osteoporosis    osteopenia  . Pleuritic chest pain    etiology unclear, has beeb eval by pulmonary and rheumatology (autoimmune dz?)  . Wrist fracture 2018    Past Surgical History:  Procedure Laterality Date  . ANTERIOR LAT LUMBAR FUSION N/A 12/03/2019   Procedure: ANTERIOR LATERAL LUMBAR FUSION L2-4;  Surgeon: Melina Schools, MD;  Location: [facility];  Service: Orthopedics;  Laterality: N/A;  4 hrs  . COLONOSCOPY    . COSMETIC SURGERY  04/12/2018   L upper eyelid ptosis repair, and trichophytic brow lift  . ESOPHAGOGASTRODUODENOSCOPY (EGD) WITH PROPOFOL N/A 03/27/2013   Procedure: ESOPHAGOGASTRODUODENOSCOPY (EGD) WITH PROPOFOL;  Surgeon: Milus Banister, MD;  Location: [facility];  Service:  Endoscopy;  Laterality: N/A;  possible dil  . 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  . plastic surgery face  2013  . POLYPECTOMY    . right rotator cuff repair  2010  . TONSILLECTOMY    . UPPER GASTROINTESTINAL ENDOSCOPY      There were no vitals filed for this visit.   Subjective Assessment - 08/26/20 1104    Subjective Pt reports things are doing about the same, also received a new medication which has been helping.    Pertinent History wrist fx, pleuritic chest pain, osteoporosis, migraine, hypothyroidism, glaucoma, fibromyalgia, R RTC repair, anterior lateral lumbar fusion L2-4 2021    Diagnostic tests none recent    Patient Stated Goals decrease pain    Currently in Pain? Yes    Pain Score 3     Pain Location Back    Pain Orientation Right;Lower    Pain Descriptors / Indicators Aching    Pain Type Chronic pain                             [facility] Adult PT Treatment/Exercise - 08/26/20 0001      Exercises   Exercises Lumbar;Shoulder;Knee/Hip;Neck      Neck Exercises: Seated  Neck Retraction 10 reps;3 secs      Lumbar Exercises: Aerobic   UBE (Upper Arm Bike) L1x 3 min fwd/ 3 min back      Lumbar Exercises: Standing   Row Strengthening;Both;10 reps;Theraband    Theraband Level (Row) Level 3 (Green)    Shoulder Extension Strengthening;Both;10 reps;Theraband    Theraband Level (Shoulder Extension) Level 3 (Green)      Lumbar Exercises: Seated   Other Seated Lumbar Exercises pball rollouts 3 way 5x10"      Lumbar Exercises: Supine   Other Supine Lumbar Exercises trunk rotation with knees bent on orange pball 10 reps each way      Lumbar Exercises: Sidelying   Other Sidelying Lumbar Exercises R/L open book stretch 10x      Manual Therapy   Manual Therapy Soft tissue mobilization;Myofascial release    Soft tissue mobilization STM to B UT, LS, rhomboids    Myofascial Release manual TPR to B LS, rhomboids                    PT Short  Term Goals - 08/19/20 1532      PT SHORT TERM GOAL #1   Title Patient to be independent with initial HEP.    Time 3    Period Weeks    Status Achieved    Target Date 08/31/20             PT Long Term Goals - 08/26/20 1151      PT LONG TERM GOAL #1   Title Patient to be independent with advanced HEP.    Time 8    Period Weeks    Status On-going      PT LONG TERM GOAL #2   Title Patient to demonstrate functional and pain-free lumbar AROM.    Time 8    Period Weeks    Status On-going      PT LONG TERM GOAL #3   Title Patient to demonstrate >=4+/5 strength in B LEs and B shoulders.    Time 8    Period Weeks    Status On-going      PT LONG TERM GOAL #4   Title Patient to report tolerance to don socks/shoes without pain limiting.    Time 8    Period Weeks    Status On-going   limited by pain and tightness     PT LONG TERM GOAL #5   Title Patient to report tolerance for 1 hour of sitting while doing puzzles without pain.    Time 8    Period Weeks    Status On-going                 Plan - 08/26/20 1152    Clinical Impression Statement Focused more on trunk mobility today with patient showing more limitations with L rotatition and SB .She needed TCs to correctly perform open books w/o rotating the hips and knees. Also some cues needed to depress the shoulder blades during postural exercises. Pt also has natural FHP and gave instruction on chin tucks to reduce stress on upper shoulder and neck muscles. Also tried STM with her, eliciting a good response with her noting less muscle tightness afterward. She also notes difficulty with donning/doffing her socks in seated position d/t hip tightness and mild pain.    Personal Factors and Comorbidities Age;Comorbidity 3+;Fitness;Past/Current Experience;Time since onset of injury/illness/exacerbation    Comorbidities wrist fx, pleuritic chest pain, osteoporosis, migraine, hypothyroidism, glaucoma, fibromyalgia, R  RTC repair,  anterior lateral lumbar fusion L2-4 2021    PT Frequency 2x / week    PT Duration 8 weeks    PT Treatment/Interventions ADLs/Self Care Home Management;Cryotherapy;Electrical Stimulation;Moist Heat;Balance training;Therapeutic exercise;Therapeutic activities;Functional mobility training;Stair training;Gait training;Ultrasound;Neuromuscular re-education;Patient/family education;Manual techniques;Vasopneumatic Device;Taping;Energy conservation;Dry needling;Passive range of motion    PT Next Visit Plan progress R UE and LE strength, lumbopelvic mobility    Consulted and Agree with Plan of Care Patient           Patient will benefit from skilled therapeutic intervention in order to improve the following deficits and impairments:  Abnormal gait,Hypomobility,Decreased activity tolerance,Decreased strength,Pain,Increased fascial restricitons,Decreased balance,Difficulty walking,Increased muscle spasms,Improper body mechanics,Decreased range of motion,Impaired flexibility,Postural dysfunction  Visit Diagnosis: Pain in thoracic spine  Chronic midline low back pain without sciatica  Muscle weakness (generalized)  Other abnormalities of gait and mobility  Difficulty in walking, not elsewhere classified     Problem List Patient Active Problem List   Diagnosis Date Noted  . Fusion of lumbar spine 12/03/2019  . COPD (chronic obstructive pulmonary disease) ([place]) 08/22/2019  . Spinal stenosis of lumbar region with neurogenic claudication 11/22/2018  . Coronary artery disease due to lipid rich plaque 10/03/2018  . Closed fracture of distal end of radius 04/16/2017  . Chronic back pain 03/15/2017  . PCP NOTES >>>>>>>>>>>>>>>>>>>> 05/24/2015  . Numbness and tingling of right arm 05/22/2015  . Insomnia 09/01/2014  . GERD (gastroesophageal reflux disease) 03/27/2013  . Dysphagia, pharyngoesophageal phase 03/27/2013  . Annual physical exam 06/12/2011  . Sinusitis, chronic 02/02/2011  .  Osteoporosis 06/08/2010  . Fibromyalgia, pain mngmt 01/29/2009  . LIPOMA 09/30/2008  . Migraine without aura 03/04/2007  . Hypothyroidism 05/15/2006  . COLONIC POLYPS, HX OF 05/15/2006    Artist Pais, PTA 08/26/2020, 12:01 PM  [facility] [address]  [place], [place], [address] Phone: [phone]   Fax:  [phone]  Name: [name] MRN: [id number] Date of Birth: Aug 03, 1945

## 2020-08-29 NOTE — Telephone Encounter (Addendum)
PA initiated via [email or web address] Determination will be faxed directly from [facility] to [facility].    [name] (Key: [id number])Need help? Call us at [phone] Status Sent to [employer] Next Steps The plan will fax you a determination, typically within 1 to 5 business days.  How do I follow up? Drug Prolia 60MG /ML syringes Form [employer]na Specialty Medicare Part B Prolia and Xgeva Injectable Precertification Request Form Precertification for Prolia and Xgeva [phone]

## 2020-08-30 ENCOUNTER — Encounter: Payer: Self-pay | Admitting: Gastroenterology

## 2020-08-31 ENCOUNTER — Ambulatory Visit: Payer: Medicare HMO

## 2020-08-31 ENCOUNTER — Other Ambulatory Visit: Payer: Self-pay

## 2020-08-31 DIAGNOSIS — R2689 Other abnormalities of gait and mobility: Secondary | ICD-10-CM | POA: Diagnosis not present

## 2020-08-31 DIAGNOSIS — R262 Difficulty in walking, not elsewhere classified: Secondary | ICD-10-CM

## 2020-08-31 DIAGNOSIS — G8929 Other chronic pain: Secondary | ICD-10-CM

## 2020-08-31 DIAGNOSIS — M546 Pain in thoracic spine: Secondary | ICD-10-CM | POA: Diagnosis not present

## 2020-08-31 DIAGNOSIS — M545 Low back pain, unspecified: Secondary | ICD-10-CM | POA: Diagnosis not present

## 2020-08-31 DIAGNOSIS — M6281 Muscle weakness (generalized): Secondary | ICD-10-CM

## 2020-08-31 NOTE — Therapy (Signed)
[facility] [address]  [place], [place], [address] Phone: [phone]   Fax:  [phone]  Physical Therapy Treatment  Patient Details  Name: [name] MRN: [id number] Date of Birth: 1945-10-02 Referring Provider (PT): Kathlene November, MD   Encounter Date: 08/31/2020   PT End of Session - 08/31/20 1147    Visit Number 6    Number of Visits 17    Date for PT Re-Evaluation 10/05/20    Authorization Type Aetna Medicare    PT Start Time 1101    PT Stop Time 1141    PT Time Calculation (min) 40 min    Activity Tolerance Patient tolerated treatment well    Behavior During Therapy [facility] for tasks assessed/performed           Past Medical History:  Diagnosis Date  . Arthritis   . Cataract    both eyes rt. eye was removed  . Chest pain    stress test @ [facility] cardiology (-)  . Colonic polyp    bx adenomatous polyps, next scope 2010  . COPD (chronic obstructive pulmonary disease) ([place])   . 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 Fibromyalgia    see rheumatology  . GERD (gastroesophageal reflux disease)    PAST HX   . Hyperlipidemia   . Hypothyroidism   . Migraine   . 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 Osteoporosis    osteopenia  . Pleuritic chest pain    etiology unclear, has beeb eval by pulmonary and rheumatology (autoimmune dz?)  . Wrist fracture 2018    Past Surgical History:  Procedure Laterality Date  . ANTERIOR LAT LUMBAR FUSION N/A 12/03/2019   Procedure: ANTERIOR LATERAL LUMBAR FUSION L2-4;  Surgeon: Melina Schools, MD;  Location: [facility];  Service: Orthopedics;  Laterality: N/A;  4 hrs  . COLONOSCOPY    . COSMETIC SURGERY  04/12/2018   L upper eyelid ptosis repair, and trichophytic brow lift  . ESOPHAGOGASTRODUODENOSCOPY (EGD) WITH PROPOFOL N/A 03/27/2013   Procedure: ESOPHAGOGASTRODUODENOSCOPY (EGD) WITH PROPOFOL;  Surgeon: Milus Banister, MD;  [facility];  Service:  Endoscopy;  Laterality: N/A;  possible dil  . 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  . plastic surgery face  2013  . POLYPECTOMY    . right rotator cuff repair  2010  . TONSILLECTOMY    . UPPER GASTROINTESTINAL ENDOSCOPY      There were no vitals filed for this visit.   Subjective Assessment - 08/31/20 1104    Subjective Pt has been having pain in her R hip over the weekend d/t her deep cleaning.    Pertinent History wrist fx, pleuritic chest pain, osteoporosis, migraine, hypothyroidism, glaucoma, fibromyalgia, R RTC repair, anterior lateral lumbar fusion L2-4 2021    Diagnostic tests none recent    Patient Stated Goals decrease pain    Currently in Pain? Yes    Pain Score 6     Pain Location Back    Pain Orientation Right;Lower    Pain Descriptors / Indicators Aching    Pain Type Chronic pain    Pain Score 6    Pain Location Hip    Pain Orientation Right    Pain Descriptors / Indicators Constant    Pain Type Chronic pain  [facility] Adult PT Treatment/Exercise - 08/31/20 0001      Exercises   Exercises Lumbar;Shoulder;Knee/Hip;Neck      Neck Exercises: Seated   Shoulder Flexion Both;5 reps    Shoulder Flexion Limitations yellow TB with chin tuck      Lumbar Exercises: Aerobic   Nustep L4x16min (UEs/LEs)      Lumbar Exercises: Supine   Other Supine Lumbar Exercises Mcnaught squeezes with PPT 10x3"    Other Supine Lumbar Exercises B shoulder flexion with yellow TB and Helbert squezes 10x3"      Knee/Hip Exercises: Stretches   Passive Hamstring Stretch Right;2 reps;30 seconds    Passive Hamstring Stretch Limitations manual stretch    Other Knee/Hip Stretches hip ABD/ ITB stretch 2x30 sec   manual stretch     Knee/Hip Exercises: Supine   Other Supine Knee/Hip Exercises R figure 4 10x3" w/o OP                    PT Short Term Goals - 08/19/20 1532      PT SHORT TERM GOAL #1   Title Patient to be independent with initial  HEP.    Time 3    Period Weeks    Status Achieved    Target Date 08/31/20             PT Long Term Goals - 08/31/20 1136      PT LONG TERM GOAL #1   Title Patient to be independent with advanced HEP.    Time 8    Period Weeks    Status On-going      PT LONG TERM GOAL #2   Title Patient to demonstrate functional and pain-free lumbar AROM.    Time 8    Period Weeks    Status On-going      PT LONG TERM GOAL #3   Title Patient to demonstrate >=4+/5 strength in B LEs and B shoulders.    Time 8    Period Weeks    Status On-going      PT LONG TERM GOAL #4   Title Patient to report tolerance to don socks/shoes without pain limiting.    Time 8    Period Weeks    Status On-going   limited by pain and tightness on the R LE     PT LONG TERM GOAL #5   Title Patient to report tolerance for 1 hour of sitting while doing puzzles without pain.    Time 8    Period Weeks    Status Achieved                 Plan - 08/31/20 1151    Clinical Impression Statement Pt came in reporting increased pain and irritation in her R hip. She did report that she cleaning a lot with squatting motions and increasing compression forces on her LEs. Did some gentle hip stretching with her showing a good response. She was unable to do a R supine piriformis stretching d/t pain in her groin. Cues required to keep from holding her breath during iso hip ADD. Also instruction given to keep nuetral pelvic position and shoulder position during exercises for stability. She reported that she is able to sit for 1-2 hours while doing a puzzle w/o pain, so has met LTG 5.    Personal Factors and Comorbidities Age;Comorbidity 3+;Fitness;Past/Current Experience;Time since onset of injury/illness/exacerbation    Comorbidities wrist fx, pleuritic chest pain, osteoporosis, migraine, hypothyroidism, glaucoma, fibromyalgia, R RTC repair,  anterior lateral lumbar fusion L2-4 2021    PT Frequency 2x / week    PT Duration 8  weeks    PT Treatment/Interventions ADLs/Self Care Home Management;Cryotherapy;Electrical Stimulation;Moist Heat;Balance training;Therapeutic exercise;Therapeutic activities;Functional mobility training;Stair training;Gait training;Ultrasound;Neuromuscular re-education;Patient/family education;Manual techniques;Vasopneumatic Device;Taping;Energy conservation;Dry needling;Passive range of motion    PT Next Visit Plan progress R UE and LE strength, lumbopelvic mobility    Consulted and Agree with Plan of Care Patient           Patient will benefit from skilled therapeutic intervention in order to improve the following deficits and impairments:  Abnormal gait,Hypomobility,Decreased activity tolerance,Decreased strength,Pain,Increased fascial restricitons,Decreased balance,Difficulty walking,Increased muscle spasms,Improper body mechanics,Decreased range of motion,Impaired flexibility,Postural dysfunction  Visit Diagnosis: Pain in thoracic spine  Chronic midline low back pain without sciatica  Muscle weakness (generalized)  Other abnormalities of gait and mobility  Difficulty in walking, not elsewhere classified     Problem List Patient Active Problem List   Diagnosis Date Noted  . Fusion of lumbar spine 12/03/2019  . COPD (chronic obstructive pulmonary disease) ([place]) 08/22/2019  . Spinal stenosis of lumbar region with neurogenic claudication 11/22/2018  . Coronary artery disease due to lipid rich plaque 10/03/2018  . Closed fracture of distal end of radius 04/16/2017  . Chronic back pain 03/15/2017  . PCP NOTES >>>>>>>>>>>>>>>>>>>> 05/24/2015  . Numbness and tingling of right arm 05/22/2015  . Insomnia 09/01/2014  . GERD (gastroesophageal reflux disease) 03/27/2013  . Dysphagia, pharyngoesophageal phase 03/27/2013  . Annual physical exam 06/12/2011  . Sinusitis, chronic 02/02/2011  . Osteoporosis 06/08/2010  . Fibromyalgia, pain mngmt 01/29/2009  . LIPOMA 09/30/2008  .  Migraine without aura 03/04/2007  . Hypothyroidism 05/15/2006  . COLONIC POLYPS, HX OF 05/15/2006    Artist Pais, PTA 08/31/2020, 12:03 PM  [facility] [address]  [place], [place], [address] Phone: [phone]   Fax:  [phone]  Name: [name] MRN: [id number] Date of Birth: [birth year]

## 2020-09-02 ENCOUNTER — Encounter: Payer: Self-pay | Admitting: Certified Registered Nurse Anesthetist

## 2020-09-03 ENCOUNTER — Other Ambulatory Visit: Payer: Self-pay

## 2020-09-03 ENCOUNTER — Encounter: Payer: Self-pay | Admitting: Gastroenterology

## 2020-09-03 ENCOUNTER — Ambulatory Visit (AMBULATORY_SURGERY_CENTER): Payer: Medicare HMO | Admitting: Gastroenterology

## 2020-09-03 VITALS — BP 108/69 | HR 62 | Temp 97.3°F | Resp 16 | Ht 64.0 in | Wt 187.0 lb

## 2020-09-03 DIAGNOSIS — J449 Chronic obstructive pulmonary disease, unspecified: Secondary | ICD-10-CM | POA: Diagnosis not present

## 2020-09-03 DIAGNOSIS — Z860100 Personal history of colon polyps, unspecified: Secondary | ICD-10-CM

## 2020-09-03 DIAGNOSIS — D122 Benign neoplasm of ascending colon: Secondary | ICD-10-CM | POA: Diagnosis not present

## 2020-09-03 DIAGNOSIS — E039 Hypothyroidism, unspecified: Secondary | ICD-10-CM | POA: Diagnosis not present

## 2020-09-03 MED ORDER — SODIUM CHLORIDE 0.9 % IV SOLN
500.0000 mL | Freq: Once | INTRAVENOUS | Status: DC
Start: 2020-09-03 — End: 2020-09-03

## 2020-09-03 NOTE — Progress Notes (Signed)
Called to room to assist during endoscopic procedure.  Patient ID and intended procedure confirmed with present staff. Received instructions for my participation in the procedure from the performing physician.  

## 2020-09-03 NOTE — Progress Notes (Signed)
[facility]  Pt's states no medical or surgical changes since previsit or office visit.  

## 2020-09-03 NOTE — Op Note (Signed)
[facility] Patient Name: [name] Procedure Date: 09/03/2020 7:52 AM MRN: [id number] Endoscopist: Milus Banister , MD Age: 75 Referring MD:  Date of Birth: 1945/12/28 Gender: Female Account #: [id number] Procedure:                Colonoscopy Indications:              High risk colon cancer surveillance: Personal                            history of colonic polyps; 2010 colonoscopy 5 subCM                            adenomas; Repeat colonoscopy 04/2012 three small                            polyps, two were adenomas; Colonoscopy 2019 5 subCM                            adenomas Medicines:                Monitored Anesthesia Care Procedure:                Pre-Anesthesia Assessment:                           - Prior to the procedure, a History and Physical                            was performed, and patient medications and                            allergies were reviewed. The patient's tolerance of                            previous anesthesia was also reviewed. The risks                            and benefits of the procedure and the sedation                            options and risks were discussed with the patient.                            All questions were answered, and informed consent                            was obtained. Prior Anticoagulants: The patient has                            taken no previous anticoagulant or antiplatelet                            agents. ASA Grade Assessment: II - A patient with  mild systemic disease. After reviewing the risks                            and benefits, the patient was deemed in                            satisfactory condition to undergo the procedure.                           After obtaining informed consent, the colonoscope                            was passed under direct vision. Throughout the                            procedure, the patient's blood pressure, pulse, and                             oxygen saturations were monitored continuously. The                            Olympus CF-HQ190 [id number]) Colonoscope was                            introduced through the anus and advanced to the the                            cecum, identified by appendiceal orifice and                            ileocecal valve. The colonoscopy was performed                            without difficulty. The patient tolerated the                            procedure well. The quality of the bowel                            preparation was good. The ileocecal valve,                            appendiceal orifice, and rectum were photographed. Scope In: 8:02:49 AM Scope Out: 8:15:11 AM Scope Withdrawal Time: 0 hours 8 minutes 41 seconds  Total Procedure Duration: 0 hours 12 minutes 22 seconds  Findings:                 A 3 mm polyp was found in the ascending colon. The                            polyp was sessile. The polyp was removed with a                            cold snare. Resection and retrieval were  complete.                           Multiple small and large-mouthed diverticula were                            found in the left colon.                           The exam was otherwise without abnormality on                            direct and retroflexion views. Complications:            No immediate complications. Estimated blood loss:                            None. Estimated Blood Loss:     Estimated blood loss: none. Impression:               - One 3 mm polyp in the ascending colon, removed                            with a cold snare. Resected and retrieved.                           - Diverticulosis in the left colon.                           - The examination was otherwise normal on direct                            and retroflexion views. Recommendation:           - Patient has a contact number available for                            emergencies. The signs  and symptoms of potential                            delayed complications were discussed with the                            patient. Return to normal activities tomorrow.                            Written discharge instructions were provided to the                            patient.                           - Resume previous diet.                           - Continue present medications.                           -  Await pathology results. Milus Banister, MD 09/03/2020 8:17:59 AM This report has been signed electronically.

## 2020-09-03 NOTE — Progress Notes (Signed)
Report given to PACU, vss 

## 2020-09-03 NOTE — Patient Instructions (Signed)
Await pathology  Please read over handouts about polyps and diverticulosis  Continue your normal medications  YOU HAD AN ENDOSCOPIC PROCEDURE TODAY AT [place]:   Refer to the procedure report that was given to you for any specific questions about what was found during the examination.  If the procedure report does not answer your questions, please call your gastroenterologist to clarify.  If you requested that your care partner not be given the details of your procedure findings, then the procedure report has been included in a sealed envelope for you to review at your convenience later.  YOU SHOULD EXPECT: Some feelings of bloating in the abdomen. Passage of more gas than usual.  Walking can help get rid of the air that was put into your GI tract during the procedure and reduce the bloating. If you had a lower endoscopy (such as a colonoscopy or flexible sigmoidoscopy) you may notice spotting of blood in your stool or on the toilet paper. If you underwent a bowel prep for your procedure, you may not have a normal bowel movement for a few days.  Please Note:  You might notice some irritation and congestion in your nose or some drainage.  This is from the oxygen used during your procedure.  There is no need for concern and it should clear up in a day or so.  SYMPTOMS TO REPORT IMMEDIATELY:   Following lower endoscopy (colonoscopy or flexible sigmoidoscopy):  Excessive amounts of blood in the stool  Significant tenderness or worsening of abdominal pains  Swelling of the abdomen that is new, acute  Fever of 100F or higher  For urgent or emergent issues, a gastroenterologist can be reached at any hour by calling [phone]. Do not use MyChart messaging for urgent concerns.    DIET:  We do recommend a small meal at first, but then you may proceed to your regular diet.  Drink plenty of fluids but you should avoid alcoholic beverages for 24 hours.  ACTIVITY:  You should  plan to take it easy for the rest of today and you should NOT DRIVE or use heavy machinery until tomorrow (because of the sedation medicines used during the test).    FOLLOW UP: Our staff will call the number listed on your records 48-72 hours following your procedure to check on you and address any questions or concerns that you may have regarding the information given to you following your procedure. If we do not reach you, we will leave a message.  We will attempt to reach you two times.  During this call, we will ask if you have developed any symptoms of COVID 19. If you develop any symptoms (ie: fever, flu-like symptoms, shortness of breath, cough etc.) before then, please call [phone].  If you test positive for Covid 19 in the 2 weeks post procedure, please call and report this information to [place].    If any biopsies were taken you will be contacted by phone or by letter within the next 1-3 weeks.  Please call us at [phone] if you have not heard about the biopsies in 3 weeks.    SIGNATURES/CONFIDENTIALITY: You and/or your care partner have signed paperwork which will be entered into your electronic medical record.  These signatures attest to the fact that that the information above on your After Visit Summary has been reviewed and is understood.  Full responsibility of the confidentiality of this discharge information lies with you and/or your care-partner.

## 2020-09-07 ENCOUNTER — Ambulatory Visit: Payer: Medicare HMO | Attending: Internal Medicine | Admitting: Rehabilitative and Restorative Service Providers"

## 2020-09-07 ENCOUNTER — Other Ambulatory Visit: Payer: Self-pay

## 2020-09-07 ENCOUNTER — Telehealth: Payer: Self-pay | Admitting: *Deleted

## 2020-09-07 ENCOUNTER — Encounter: Payer: Self-pay | Admitting: Rehabilitative and Restorative Service Providers"

## 2020-09-07 DIAGNOSIS — M6281 Muscle weakness (generalized): Secondary | ICD-10-CM | POA: Diagnosis not present

## 2020-09-07 DIAGNOSIS — M546 Pain in thoracic spine: Secondary | ICD-10-CM | POA: Insufficient documentation

## 2020-09-07 DIAGNOSIS — M545 Low back pain, unspecified: Secondary | ICD-10-CM | POA: Insufficient documentation

## 2020-09-07 DIAGNOSIS — R2689 Other abnormalities of gait and mobility: Secondary | ICD-10-CM | POA: Diagnosis not present

## 2020-09-07 DIAGNOSIS — G8929 Other chronic pain: Secondary | ICD-10-CM | POA: Diagnosis not present

## 2020-09-07 DIAGNOSIS — R262 Difficulty in walking, not elsewhere classified: Secondary | ICD-10-CM | POA: Diagnosis not present

## 2020-09-07 NOTE — Telephone Encounter (Signed)
  Follow up Call-  Call back number 09/03/2020  Post procedure Call Back phone  # [phone]  Permission to leave phone message Yes  Some recent data might be hidden     No answer at 2nd attempt follow up phone call.  Left message on voicemail.

## 2020-09-07 NOTE — Telephone Encounter (Signed)
Inbound call from patient. Return called stated she is doing fine. No complaints

## 2020-09-07 NOTE — Telephone Encounter (Signed)
  Follow up Call-  Call back number 09/03/2020  Post procedure Call Back phone  # [phone]  Permission to leave phone message Yes  Some recent data might be hidden   [facility]

## 2020-09-07 NOTE — Therapy (Signed)
[facility] [address]  [place], [place], [address] Phone: [phone]   Fax:  [phone]  Physical Therapy Treatment  Patient Details  Name: [name] MRN: [id number] Date of Birth: [birth year] Referring Provider (PT): Kathlene November, MD   Encounter Date: 09/07/2020   PT End of Session - 09/07/20 1107    Visit Number 7    Number of Visits 17    Date for PT Re-Evaluation 10/05/20    Authorization Type Aetna Medicare    PT Start Time 1059    PT Stop Time 1140    PT Time Calculation (min) 41 min    Activity Tolerance Patient tolerated treatment well    Behavior During Therapy [facility] for tasks assessed/performed           Past Medical History:  Diagnosis Date  . Arthritis   . Cataract    both eyes rt. eye was removed  . Chest pain    stress test @ [facility] cardiology (-)  . Colonic polyp    bx adenomatous polyps, next scope 2010  . COPD (chronic obstructive pulmonary disease) ([place])   . 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 Fibromyalgia    see rheumatology  . GERD (gastroesophageal reflux disease)    PAST HX   . Hyperlipidemia   . Hypothyroidism   . Migraine   . 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 Osteoporosis    osteopenia  . Pleuritic chest pain    etiology unclear, has beeb eval by pulmonary and rheumatology (autoimmune dz?)  . Wrist fracture 2018    Past Surgical History:  Procedure Laterality Date  . ANTERIOR LAT LUMBAR FUSION N/A 12/03/2019   Procedure: ANTERIOR LATERAL LUMBAR FUSION L2-4;  Surgeon: Melina Schools, MD;  Location: [facility];  Service: Orthopedics;  Laterality: N/A;  4 hrs  . COLONOSCOPY    . COSMETIC SURGERY  04/12/2018   L upper eyelid ptosis repair, and trichophytic brow lift  . ESOPHAGOGASTRODUODENOSCOPY (EGD) WITH PROPOFOL N/A 03/27/2013   Procedure: ESOPHAGOGASTRODUODENOSCOPY (EGD) WITH PROPOFOL;  Surgeon: Milus Banister, MD;  Location: [facility];  Service:  Endoscopy;  Laterality: N/A;  possible dil  . 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  . plastic surgery face  2013  . POLYPECTOMY    . right rotator cuff repair  2010  . TONSILLECTOMY    . UPPER GASTROINTESTINAL ENDOSCOPY      There were no vitals filed for this visit.   Subjective Assessment - 09/07/20 1102    Subjective I have been having a lot of pain in my back and R hip region since getting my colonoscopy last week and the position that they had me in.    Pertinent History wrist fx, pleuritic chest pain, osteoporosis, migraine, hypothyroidism, glaucoma, fibromyalgia, R RTC repair, anterior lateral lumbar fusion L2-4 2021    Patient Stated Goals decrease pain    Currently in Pain? Yes    Pain Score 7     Pain Location Back    Pain Orientation Mid;Lower;Right    Pain Descriptors / Indicators Sharp    Pain Type Acute pain                             [facility] Adult PT Treatment/Exercise - 09/07/20 0001      Lumbar Exercises: Aerobic   Nustep L5 x6 min (  UE/LE)      Lumbar Exercises: Standing   Row Strengthening;Both;20 reps    Theraband Level (Row) Level 3 (Green)    Shoulder Extension Strengthening;Both;20 reps    Theraband Level (Shoulder Extension) Level 1 (Yellow)   lower theraband due to back pain   Other Standing Lumbar Exercises shoulder flexion with yellow tband 2x10      Lumbar Exercises: Seated   Other Seated Lumbar Exercises pball rollouts 3 way 5x10"      Knee/Hip Exercises: Seated   Long Arc Quad Both;1 set;10 reps    Long Arc Quad Limitations sitting on orange pball    Marching Both;1 set;10 reps    Marching Limitations sitting on orange pball    Hamstring Curl Strengthening;Both;1 set;10 reps    Hamstring Limitations yellow tband      Manual Therapy   Manual Therapy Soft tissue mobilization;Myofascial release    Manual therapy comments seated position    Soft tissue mobilization STM to R side of thoracic and lumbar spine    Myofascial  Release manual TPR to points noted on R side of lumbar region                    PT Short Term Goals - 08/19/20 1532      PT SHORT TERM GOAL #1   Title Patient to be independent with initial HEP.    Time 3    Period Weeks    Status Achieved    Target Date 08/31/20             PT Long Term Goals - 09/07/20 1239      PT LONG TERM GOAL #1   Title Patient to be independent with advanced HEP.    Status On-going      PT LONG TERM GOAL #2   Title Patient to demonstrate functional and pain-free lumbar AROM.    Status On-going      PT LONG TERM GOAL #3   Title Patient to demonstrate >=4+/5 strength in B LEs and B shoulders.    Status On-going                 Plan - 09/07/20 1151    Clinical Impression Statement Pt arrives at session with increased pain, so resistance on theraband was decreased to assist with decreased pain.  Pt with pain during trigger point release, however, following manual therapy, her pain decreased to 4/10 with decreased antalgic gait noted and improved RLE stability with turning.  She continues to progress towards increased strengthening and improved functional mobility, especially after pain decreased with STM to lumbar region.  Pt continues to require cuing for PPT and ab set, especially with seated marching and LAQ on theraball.    Personal Factors and Comorbidities Age;Comorbidity 3+;Fitness;Past/Current Experience;Time since onset of injury/illness/exacerbation    Comorbidities wrist fx, pleuritic chest pain, osteoporosis, migraine, hypothyroidism, glaucoma, fibromyalgia, R RTC repair, anterior lateral lumbar fusion L2-4 2021    PT Treatment/Interventions ADLs/Self Care Home Management;Cryotherapy;Electrical Stimulation;Moist Heat;Balance training;Therapeutic exercise;Therapeutic activities;Functional mobility training;Stair training;Gait training;Ultrasound;Neuromuscular re-education;Patient/family education;Manual techniques;Vasopneumatic  Device;Taping;Energy conservation;Dry needling;Passive range of motion    PT Next Visit Plan progress R UE and LE strength, lumbopelvic mobility    Consulted and Agree with Plan of Care Patient           Patient will benefit from skilled therapeutic intervention in order to improve the following deficits and impairments:  Abnormal gait,Hypomobility,Decreased activity tolerance,Decreased strength,Pain,Increased fascial restricitons,Decreased balance,Difficulty walking,Increased muscle spasms,Improper body  mechanics,Decreased range of motion,Impaired flexibility,Postural dysfunction  Visit Diagnosis: Pain in thoracic spine  Chronic midline low back pain without sciatica  Muscle weakness (generalized)  Other abnormalities of gait and mobility  Difficulty in walking, not elsewhere classified     Problem List Patient Active Problem List   Diagnosis Date Noted  . Fusion of lumbar spine 12/03/2019  . COPD (chronic obstructive pulmonary disease) ([place]) 08/22/2019  . Spinal stenosis of lumbar region with neurogenic claudication 11/22/2018  . Coronary artery disease due to lipid rich plaque 10/03/2018  . Closed fracture of distal end of radius 04/16/2017  . Chronic back pain 03/15/2017  . PCP NOTES >>>>>>>>>>>>>>>>>>>> 05/24/2015  . Numbness and tingling of right arm 05/22/2015  . Insomnia 09/01/2014  . GERD (gastroesophageal reflux disease) 03/27/2013  . Dysphagia, pharyngoesophageal phase 03/27/2013  . Annual physical exam 06/12/2011  . Sinusitis, chronic 02/02/2011  . Osteoporosis 06/08/2010  . Fibromyalgia, pain mngmt 01/29/2009  . LIPOMA 09/30/2008  . Migraine without aura 03/04/2007  . Hypothyroidism 05/15/2006  . COLONIC POLYPS, HX OF 05/15/2006    Juel Burrow, PT, DPT 09/07/2020, 12:42 PM  [facility] [address]  [place], [place], [address] Phone: [phone]   Fax:  [phone]  Name: [name] MRN: [id number] Date of Birth: 1946-02-09

## 2020-09-09 NOTE — Telephone Encounter (Signed)
Pt ready for scheduling on or after 09/10/20  Out-of-pocket cost due at time of visit: $280  Primary: A[employer]na Medicare Prolia co-insurance: 20% ($255) Admin fee co-insurance: 20% ($25)  Secondary: n/a Prolia co-insurance:  Admin fee co-insurance:   Deductible: does not apply  Prior Auth: APPROVED PA# [id number] Valid: 01/30/20-01/29/21

## 2020-09-10 ENCOUNTER — Other Ambulatory Visit: Payer: Self-pay

## 2020-09-10 ENCOUNTER — Ambulatory Visit: Payer: Medicare HMO | Attending: Internal Medicine

## 2020-09-10 DIAGNOSIS — M6281 Muscle weakness (generalized): Secondary | ICD-10-CM | POA: Diagnosis not present

## 2020-09-10 DIAGNOSIS — R262 Difficulty in walking, not elsewhere classified: Secondary | ICD-10-CM | POA: Insufficient documentation

## 2020-09-10 DIAGNOSIS — M545 Low back pain, unspecified: Secondary | ICD-10-CM | POA: Diagnosis not present

## 2020-09-10 DIAGNOSIS — G8929 Other chronic pain: Secondary | ICD-10-CM | POA: Diagnosis not present

## 2020-09-10 DIAGNOSIS — R2689 Other abnormalities of gait and mobility: Secondary | ICD-10-CM | POA: Diagnosis not present

## 2020-09-10 DIAGNOSIS — M546 Pain in thoracic spine: Secondary | ICD-10-CM | POA: Diagnosis not present

## 2020-09-10 NOTE — Therapy (Signed)
[facility] [address]  [place], [place], [address] Phone: [phone]   Fax:  [phone]  Physical Therapy Treatment  Patient Details  Name: [name] MRN: [id number] Date of Birth: March 31, 1946 Referring Provider (PT): Kathlene November, MD   Encounter Date: 09/10/2020   PT End of Session - 09/10/20 1147    Visit Number 8    Number of Visits 17    Date for PT Re-Evaluation 10/05/20    Authorization Type Aetna Medicare    PT Start Time 1103    PT Stop Time 1143    PT Time Calculation (min) 40 min    Activity Tolerance Patient tolerated treatment well    Behavior During Therapy [facility] for tasks assessed/performed           Past Medical History:  Diagnosis Date  . Arthritis   . Cataract    both eyes rt. eye was removed  . Chest pain    stress test @ [facility] cardiology (-)  . Colonic polyp    bx adenomatous polyps, next scope 2010  . COPD (chronic obstructive pulmonary disease) ([place])   . 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 Fibromyalgia    see rheumatology  . GERD (gastroesophageal reflux disease)    PAST HX   . Hyperlipidemia   . Hypothyroidism   . Migraine   . 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 Osteoporosis    osteopenia  . Pleuritic chest pain    etiology unclear, has beeb eval by pulmonary and rheumatology (autoimmune dz?)  . Wrist fracture 2018    Past Surgical History:  Procedure Laterality Date  . ANTERIOR LAT LUMBAR FUSION N/A 12/03/2019   Procedure: ANTERIOR LATERAL LUMBAR FUSION L2-4;  Surgeon: Melina Schools, MD;  Location: [facility];  Service: Orthopedics;  Laterality: N/A;  4 hrs  . COLONOSCOPY    . COSMETIC SURGERY  04/12/2018   L upper eyelid ptosis repair, and trichophytic brow lift  . ESOPHAGOGASTRODUODENOSCOPY (EGD) WITH PROPOFOL N/A 03/27/2013   Procedure: ESOPHAGOGASTRODUODENOSCOPY (EGD) WITH PROPOFOL;  Surgeon: Milus Banister, MD;  [facility]: [facility];  Service:  Endoscopy;  Laterality: N/A;  possible dil  . 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  . plastic surgery face  2013  . POLYPECTOMY    . right rotator cuff repair  2010  . TONSILLECTOMY    . UPPER GASTROINTESTINAL ENDOSCOPY      There were no vitals filed for this visit.   Subjective Assessment - 09/10/20 1113    Subjective Pt reports no complications from colonoscopy but some R hip pain increase.    Pertinent History wrist fx, pleuritic chest pain, osteoporosis, migraine, hypothyroidism, glaucoma, fibromyalgia, R RTC repair, anterior lateral lumbar fusion L2-4 2021    Diagnostic tests none recent    Patient Stated Goals decrease pain    Currently in Pain? Yes    Pain Score 3     Pain Location Back    Pain Orientation Right;Left;Lower    Pain Descriptors / Indicators Sharp    Pain Type Acute pain                             [facility] Adult PT Treatment/Exercise - 09/10/20 0001      Lumbar Exercises: Stretches   Active Hamstring Stretch Right;2 reps;30 seconds    Other Lumbar Stretch Exercise low  back stretch seated 2x30 sec    Other Lumbar Stretch Exercise glute knee flexion stretch on step 2x30 sec      Lumbar Exercises: Aerobic   Nustep L5 x6 min (UE/LE)      Manual Therapy   Manual Therapy Soft tissue mobilization;Myofascial release    Manual therapy comments prone    Soft tissue mobilization STM to R side of thoracic and lumbar spine; glutes    Myofascial Release manual TPR to points noted on R side of lumbar region and glutes                    PT Short Term Goals - 08/19/20 1532      PT SHORT TERM GOAL #1   Title Patient to be independent with initial HEP.    Time 3    Period Weeks    Status Achieved    Target Date 08/31/20             PT Long Term Goals - 09/07/20 1239      PT LONG TERM GOAL #1   Title Patient to be independent with advanced HEP.    Status On-going      PT LONG TERM GOAL #2   Title Patient to demonstrate  functional and pain-free lumbar AROM.    Status On-going      PT LONG TERM GOAL #3   Title Patient to demonstrate >=4+/5 strength in B LEs and B shoulders.    Status On-going                 Plan - 09/10/20 1149    Clinical Impression Statement Pt reports the most benefit so far from [facility] to her paraspinal muscles along with glutes/piriformis. Cues required to properly target glutes during standing stretch and to lightly stretch muscles with long duration. Lots of ttp in the lower paraspinals and glutes. She showed an increased ability to lifting R LE in tailor position post MT. Pt still showing excessive thoracic kyphotic posture. She would benefit from continued soft tissue work, stretching, and postural re-ed restore function.    Personal Factors and Comorbidities Age;Comorbidity 3+;Fitness;Past/Current Experience;Time since onset of injury/illness/exacerbation    Comorbidities wrist fx, pleuritic chest pain, osteoporosis, migraine, hypothyroidism, glaucoma, fibromyalgia, R RTC repair, anterior lateral lumbar fusion L2-4 2021    PT Frequency 2x / week    PT Duration 8 weeks    PT Treatment/Interventions ADLs/Self Care Home Management;Cryotherapy;Electrical Stimulation;Moist Heat;Balance training;Therapeutic exercise;Therapeutic activities;Functional mobility training;Stair training;Gait training;Ultrasound;Neuromuscular re-education;Patient/family education;Manual techniques;Vasopneumatic Device;Taping;Energy conservation;Dry needling;Passive range of motion    PT Next Visit Plan STM for pain relief and to decrease muscle tension; progress R UE and LE strength, lumbopelvic mobility    Consulted and Agree with Plan of Care Patient           Patient will benefit from skilled therapeutic intervention in order to improve the following deficits and impairments:  Abnormal gait,Hypomobility,Decreased activity tolerance,Decreased strength,Pain,Increased fascial restricitons,Decreased  balance,Difficulty walking,Increased muscle spasms,Improper body mechanics,Decreased range of motion,Impaired flexibility,Postural dysfunction  Visit Diagnosis: Pain in thoracic spine  Chronic midline low back pain without sciatica  Muscle weakness (generalized)  Other abnormalities of gait and mobility  Difficulty in walking, not elsewhere classified     Problem List Patient Active Problem List   Diagnosis Date Noted  . Fusion of lumbar spine 12/03/2019  . COPD (chronic obstructive pulmonary disease) ([place]) 08/22/2019  . Spinal stenosis of lumbar region with neurogenic claudication 11/22/2018  . Coronary artery  disease due to lipid rich plaque 10/03/2018  . Closed fracture of distal end of radius 04/16/2017  . Chronic back pain 03/15/2017  . PCP NOTES >>>>>>>>>>>>>>>>>>>> 05/24/2015  . Numbness and tingling of right arm 05/22/2015  . Insomnia 09/01/2014  . GERD (gastroesophageal reflux disease) 03/27/2013  . Dysphagia, pharyngoesophageal phase 03/27/2013  . Annual physical exam 06/12/2011  . Sinusitis, chronic 02/02/2011  . Osteoporosis 06/08/2010  . Fibromyalgia, pain mngmt 01/29/2009  . LIPOMA 09/30/2008  . Migraine without aura 03/04/2007  . Hypothyroidism 05/15/2006  . COLONIC POLYPS, HX OF 05/15/2006    Artist Pais, PTA 09/10/2020, 12:10 PM  [facility] [address]  [place], [place], [address] Phone: [phone]   Fax:  [phone]  Name: [name] MRN: [id number] Date of Birth: 1945/12/10

## 2020-09-13 NOTE — Telephone Encounter (Signed)
Pt will receive Prolia injection at time of OV on 6/28 at 11 am.

## 2020-09-14 ENCOUNTER — Ambulatory Visit: Payer: Medicare HMO

## 2020-09-14 ENCOUNTER — Encounter: Payer: Self-pay | Admitting: Gastroenterology

## 2020-09-14 ENCOUNTER — Other Ambulatory Visit: Payer: Self-pay

## 2020-09-14 DIAGNOSIS — M545 Low back pain, unspecified: Secondary | ICD-10-CM

## 2020-09-14 DIAGNOSIS — M546 Pain in thoracic spine: Secondary | ICD-10-CM

## 2020-09-14 DIAGNOSIS — G8929 Other chronic pain: Secondary | ICD-10-CM

## 2020-09-14 DIAGNOSIS — R262 Difficulty in walking, not elsewhere classified: Secondary | ICD-10-CM | POA: Diagnosis not present

## 2020-09-14 DIAGNOSIS — M6281 Muscle weakness (generalized): Secondary | ICD-10-CM | POA: Diagnosis not present

## 2020-09-14 DIAGNOSIS — R2689 Other abnormalities of gait and mobility: Secondary | ICD-10-CM | POA: Diagnosis not present

## 2020-09-14 NOTE — Therapy (Addendum)
[facility] [address]  [place], [place], [address] Phone: [phone]   Fax:  [phone]  Physical Therapy Treatment  Patient Details  Name: [name] MRN: [id number] Date of Birth: 31-Dec-1945 Referring Provider (PT): Kathlene November, MD   Progress Note Reporting Period 08/10/20 to 10/27/20  See note below for Objective Data and Assessment of Progress/Goals.     Encounter Date: 09/14/2020   PT End of Session - 09/14/20 1115     Visit Number 9    Number of Visits 17    Date for PT Re-Evaluation 10/05/20    Authorization Type [employer]    PT Start Time 1020    PT Stop Time 1108    PT Time Calculation (min) 48 min    Activity Tolerance Patient tolerated treatment well    Behavior During Therapy WFL for tasks assessed/performed             Past Medical History:  Diagnosis Date   Arthritis    Cataract    both eyes rt. 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   on doxy   Osteoporosis    osteopenia   Pleuritic chest pain    etiology unclear, has beeb eval by pulmonary and rheumatology (autoimmune dz?)   Wrist fracture 2018    Past Surgical History:  Procedure Laterality Date   ANTERIOR LAT LUMBAR FUSION N/A 12/03/2019   Procedure: ANTERIOR LATERAL LUMBAR FUSION L2-4;  Surgeon: Melina Schools, MD;  Location: [facility];  Service: Orthopedics;  Laterality: N/A;  4 hrs   COLONOSCOPY     COSMETIC SURGERY  04/12/2018   L upper eyelid ptosis repair, and trichophytic brow lift   ESOPHAGOGASTRODUODENOSCOPY (EGD) WITH PROPOFOL N/A 03/27/2013    Procedure: ESOPHAGOGASTRODUODENOSCOPY (EGD) WITH PROPOFOL;  Surgeon: Milus Banister, MD;  Location: [facility];  Service: Endoscopy;  Laterality: N/A;  possible dil   EYE SURGERY     growth on cornea-bil.   plastic surgery face  2013   POLYPECTOMY     right rotator cuff repair  2010   TONSILLECTOMY     UPPER GASTROINTESTINAL ENDOSCOPY      There were no vitals filed for this visit.   Subjective Assessment - 09/14/20 1026     Subjective Much improvement from the massage the other day.    Pertinent History wrist fx, pleuritic chest pain, osteoporosis, migraine, hypothyroidism, glaucoma, fibromyalgia, R RTC repair, anterior lateral lumbar fusion L2-4 2021    Diagnostic tests none recent    Patient Stated Goals decrease pain    Currently in Pain? No/denies                               [facility] Adult PT Treatment/Exercise - 09/14/20 0001       Lumbar Exercises: Stretches   Single Knee to Chest Stretch Right;2 reps;20 seconds    Single Knee to Chest Stretch Limitations supine    ITB Stretch  Right;2 reps;30 seconds    ITB Stretch Limitations supine with legs crossed    Piriformis Stretch Right;2 reps;30 seconds    Piriformis Stretch Limitations supine      Lumbar Exercises: Aerobic   Nustep L4 x6 min (UE/LE)      Lumbar Exercises: Supine   Bridge with clamshell 10 reps   with red TB, much instability noted     Knee/Hip Exercises: Sidelying   Hip ABduction Strengthening;Right;10 reps      Manual Therapy   Manual Therapy Soft tissue mobilization;Myofascial release    Manual therapy comments prone    Soft tissue mobilization STM to R side of thoracic and lumbar spine; glute medius/minimus    Myofascial Release manual TPR to points noted on R side of lumbar region and glute medius/minimus                    PT Education - 09/14/20 1114     Education Details Update to HEP and review. Access Code: [email or web address]    Person(s) Educated Patient     Methods Explanation;Demonstration;Handout    Comprehension Verbalized understanding;Returned demonstration              PT Short Term Goals - 08/19/20 1532       PT SHORT TERM GOAL #1   Title Patient to be independent with initial HEP.    Time 3    Period Weeks    Status Achieved    Target Date 08/31/20               PT Long Term Goals - 09/14/20 1140       PT LONG TERM GOAL #1   Title Patient to be independent with advanced HEP.    Time 8    Period Weeks    Status On-going      PT LONG TERM GOAL #2   Title Patient to demonstrate functional and pain-free lumbar AROM.    Time 8    Period Weeks    Status On-going      PT LONG TERM GOAL #3   Title Patient to demonstrate >=4+/5 strength in B LEs and B shoulders.    Time 8    Period Weeks    Status On-going      PT LONG TERM GOAL #4   Title Patient to report tolerance to don socks/shoes without pain limiting.    Time 8    Period Weeks    Status On-going   better with STM but still limited by tightness on the R LE     PT LONG TERM GOAL #5   Title Patient to report tolerance for 1 hour of sitting while doing puzzles without pain.    Time 8    Period Weeks    Status Achieved                   Plan - 09/14/20 1119     Clinical Impression Statement Pt reports that the massage from last session had her feeling great the following 2 days. Most ttp and tightness was palpated in the glute medius/minimus. D/t her reporting increased muscle tension and "difficulty of her R hip to support her leg" updated HEP to target more of the glutes/hip abductors with stretches and strengthening exercises. She showed quite a bit of instability with the S/L hip ABD and bridges with the red TB. Reviewed her HEP with her to ensure understanding to target the glutes and lateral hip musculature  going foward to improve her function. No conerns at the end of sesion.    Personal Factors and Comorbidities Age;Comorbidity  3+;Fitness;Past/Current Experience;Time since onset of injury/illness/exacerbation    Comorbidities wrist fx, pleuritic chest pain, osteoporosis, migraine, hypothyroidism, glaucoma, fibromyalgia, R RTC repair, anterior lateral lumbar fusion L2-4 2021    PT Frequency 2x / week    PT Duration 8 weeks    PT Treatment/Interventions ADLs/Self Care Home Management;Cryotherapy;Electrical Stimulation;Moist Heat;Balance training;Therapeutic exercise;Therapeutic activities;Functional mobility training;Stair training;Gait training;Ultrasound;Neuromuscular re-education;Patient/family education;Manual techniques;Vasopneumatic Device;Taping;Energy conservation;Dry needling;Passive range of motion    PT Next Visit Plan STM for pain relief and to decrease muscle tension; progress R UE and LE strength, lumbopelvic mobility    Consulted and Agree with Plan of Care Patient             Patient will benefit from skilled therapeutic intervention in order to improve the following deficits and impairments:  Abnormal gait,Hypomobility,Decreased activity tolerance,Decreased strength,Pain,Increased fascial restricitons,Decreased balance,Difficulty walking,Increased muscle spasms,Improper body mechanics,Decreased range of motion,Impaired flexibility,Postural dysfunction  Visit Diagnosis: Pain in thoracic spine  Chronic midline low back pain without sciatica  Muscle weakness (generalized)  Other abnormalities of gait and mobility  Difficulty in walking, not elsewhere classified     Problem List Patient Active Problem List   Diagnosis Date Noted   Fusion of lumbar spine 12/03/2019   COPD (chronic obstructive pulmonary disease) ([place]) 08/22/2019   Spinal stenosis of lumbar region with neurogenic claudication 11/22/2018   Coronary artery disease due to lipid rich plaque 10/03/2018   Closed fracture of distal end of radius 04/16/2017   Chronic back pain 03/15/2017   PCP NOTES >>>>>>>>>>>>>>>>>>>> 05/24/2015    Numbness and tingling of right arm 05/22/2015   Insomnia 09/01/2014   GERD (gastroesophageal reflux disease) 03/27/2013   Dysphagia, pharyngoesophageal phase 03/27/2013   Annual physical exam 06/12/2011   Sinusitis, chronic 02/02/2011   Osteoporosis 06/08/2010   Fibromyalgia, pain mngmt 01/29/2009   LIPOMA 09/30/2008   Migraine without aura 03/04/2007   Hypothyroidism 05/15/2006   COLONIC POLYPS, HX OF 05/15/2006    Artist Pais, PTA 09/14/2020, 11:42 AM  [facility] [address]  [place], [place], [address] Phone: [phone]   Fax:  [phone]  Name: [name] MRN: [id number] Date of Birth: [birth year]   PHYSICAL THERAPY DISCHARGE SUMMARY  Visits from Start of Care: 9  Current functional level related to goals / functional outcomes: Unable to assess; patient did not return d/t reaction from spinal injection   Remaining deficits: Unable to assess   Education / Equipment: HEP  Plan: Patient agrees to discharge.  Patient goals were partially met. Patient is being discharged d/t not returning.     Janene Harvey, PT, DPT 10/27/20 8:59 AM

## 2020-09-16 DIAGNOSIS — M5416 Radiculopathy, lumbar region: Secondary | ICD-10-CM | POA: Diagnosis not present

## 2020-09-17 ENCOUNTER — Encounter: Payer: Medicare HMO | Admitting: Physical Therapy

## 2020-09-19 NOTE — Telephone Encounter (Signed)
Pt scheduled to see Dr. Cruzita Lederer 10/05/20.

## 2020-09-21 ENCOUNTER — Encounter: Payer: Medicare HMO | Admitting: Physical Therapy

## 2020-09-23 ENCOUNTER — Telehealth: Payer: Self-pay | Admitting: Internal Medicine

## 2020-09-23 ENCOUNTER — Telehealth: Payer: Self-pay | Admitting: Family Medicine

## 2020-09-23 ENCOUNTER — Ambulatory Visit: Payer: Medicare HMO

## 2020-09-23 MED ORDER — HYDROCODONE-ACETAMINOPHEN 7.5-325 MG PO TABS: 1.0000 | ORAL_TABLET | Freq: Four times a day (QID) | ORAL | 0 refills | Status: DC | PRN

## 2020-09-23 NOTE — Telephone Encounter (Signed)
PDMP okay, Rx sent 

## 2020-09-23 NOTE — Telephone Encounter (Signed)
Requesting: hydrocodone 7.5-325mg  Contract: 01/29/2020 UDS: 01/29/2020 Last Visit: 07/20/2020 Next Visit:  09/29/2020 Last Refill: 08/24/2020 #120 and 0RF   Please Advise

## 2020-09-23 NOTE — Telephone Encounter (Signed)
Requesting: alprazolam 0.5mg  Contract: 01/29/2020 UDS: 01/29/2020 Last Visit: 07/20/2020 Next Visit:  09/29/2020 Last Refill: 05/26/2020 #30 and 3RF  Please Advise

## 2020-09-24 ENCOUNTER — Encounter: Payer: Medicare HMO | Admitting: Physical Therapy

## 2020-09-28 ENCOUNTER — Encounter: Payer: Medicare HMO | Admitting: Physical Therapy

## 2020-09-29 ENCOUNTER — Encounter: Payer: Self-pay | Admitting: Internal Medicine

## 2020-09-29 ENCOUNTER — Ambulatory Visit (INDEPENDENT_AMBULATORY_CARE_PROVIDER_SITE_OTHER): Payer: Medicare HMO | Admitting: Internal Medicine

## 2020-09-29 ENCOUNTER — Other Ambulatory Visit: Payer: Self-pay

## 2020-09-29 VITALS — BP 138/92 | HR 77 | Temp 98.1°F | Resp 16 | Ht 64.0 in | Wt 187.5 lb

## 2020-09-29 DIAGNOSIS — K120 Recurrent oral aphthae: Secondary | ICD-10-CM

## 2020-09-29 DIAGNOSIS — E039 Hypothyroidism, unspecified: Secondary | ICD-10-CM

## 2020-09-29 LAB — TSH: TSH: 0.66 u[IU]/mL (ref 0.35–4.50)

## 2020-09-29 NOTE — Patient Instructions (Signed)
    GO TO THE LAB : Get the blood work     GO TO THE FRONT DESK, PLEASE SCHEDULE YOUR APPOINTMENTS Come back for a checkup in 6 months 

## 2020-09-29 NOTE — Progress Notes (Signed)
Subjective:    Patient ID: [name], female    DOB: 1945-10-13, 75 y.o.   MRN: [id number]  DOS:  09/29/2020 Type of visit - description: ROV  Since the last office visit, back pain has improved. Emotionally she is doing much better, never tried the antidepressants. She continue with canker sores on and off at different places on her mouth. Still have some uncomfortable feeling of the tongue, burning.  After the local injection on her back on 09/14/2020, she developed some hoarseness, her face also got very red for 2 days. Denies fever chills, no cough, lip or tongue swelling. She is still slightly hoarse.  BP Readings from Last 3 Encounters:  09/29/20 (!) 138/92  09/03/20 108/69  07/20/20 122/80      Review of Systems See above   Past Medical History:  Diagnosis Date   Arthritis    Cataract    both eyes rt. 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   on doxy   Osteoporosis    osteopenia   Pleuritic chest pain    etiology unclear, has beeb eval by pulmonary and rheumatology (autoimmune dz?)   Wrist fracture 2018    Past Surgical History:  Procedure Laterality Date   ANTERIOR LAT LUMBAR FUSION N/A 12/03/2019   Procedure: ANTERIOR LATERAL LUMBAR FUSION L2-4;  Surgeon: Melina Schools, MD;  Location: [facility];  Service: Orthopedics;  Laterality: N/A;  4 hrs   COLONOSCOPY     COSMETIC SURGERY  04/12/2018   L upper eyelid ptosis repair, and trichophytic brow lift   ESOPHAGOGASTRODUODENOSCOPY (EGD) WITH PROPOFOL N/A 03/27/2013   Procedure: ESOPHAGOGASTRODUODENOSCOPY (EGD) WITH PROPOFOL;  Surgeon: Milus Banister, MD;   Location: [facility];  Service: Endoscopy;  Laterality: N/A;  possible dil   EYE SURGERY     growth on cornea-bil.   plastic surgery face  2013   POLYPECTOMY     right rotator cuff repair  2010   TONSILLECTOMY     UPPER GASTROINTESTINAL ENDOSCOPY      Allergies as of 09/29/2020       Reactions   Alendronate Sodium Hives   Bactrim [sulfamethoxazole-trimethoprim]    Upset stomach   Contrast Media [iodinated Diagnostic Agents] Hives   Iodine Hives   Milnacipran    REACTION: increased bp, increased hr   Penicillins Hives        Medication List        Accurate as of September 29, 2020 11:59 PM. If you have any questions, ask your nurse or doctor.          STOP taking these medications    DULoxetine 30 MG capsule Commonly known as: CYMBALTA Stopped by: Kathlene November, MD       TAKE these medications    Advair Diskus 100-50 MCG/ACT Aepb Generic drug: fluticasone-salmeterol Inhale 1 puff into the lungs in the morning and at bedtime.   ALPRAZolam 0.5 MG tablet Commonly known as: XANAX TAKE 1 TABLET BY MOUTH AT BEDTIME AS NEEDED FOR ANXIETY   atorvastatin 10  MG tablet Commonly known as: LIPITOR Take 1 tablet (10 mg total) by mouth at bedtime.   calcium carbonate 1250 (500 Ca) MG chewable tablet Commonly known as: OS-CAL Chew 1 tablet by mouth 2 (two) times daily.   celecoxib 200 MG capsule Commonly known as: CELEBREX Take 200 mg by mouth daily.   denosumab 60 MG/ML Sosy injection Commonly known as: PROLIA Inject 60 mg into the skin every 6 (six) months.   Estradiol 10 MCG Tabs vaginal tablet Commonly known as: Yuvafem PLACE 1 TABLET VAGINALLY 2 TIMES A WEEK.   HYDROcodone-acetaminophen 7.5-325 MG tablet Commonly known as: NORCO Take 1 tablet by mouth every 6 (six) hours as needed for moderate pain.   hydroxypropyl methylcellulose / hypromellose 2.5 % ophthalmic solution Commonly known as: ISOPTO TEARS / GONIOVISC Place 1 drop into both eyes 3 (three) times  daily as needed for dry eyes.   levothyroxine 88 MCG tablet Commonly known as: SYNTHROID Take 1 tablet (88 mcg total) by mouth daily before breakfast.   multivitamin capsule Take 1 capsule by mouth daily.   Narcan 4 MG/0.1ML Liqd nasal spray kit Generic drug: naloxone Use as directed   Probiotic-10 Chew Chew 1 tablet by mouth daily.   Vitamin D3 50 MCG (2000 UT) Chew Chew 2,000 Units by mouth daily.           Objective:   Physical Exam BP (!) 138/92 (BP Location: Left Arm, Patient Position: Sitting, Cuff Size: Small)   Pulse 77   Temp 98.1 F (36.7 C) (Oral)   Resp 16   Ht 5' 4" (1.626 m)   Wt 187 lb 8 oz (85 kg)   LMP 04/10/1998 (Approximate)   SpO2 98%   BMI 32.18 kg/m  General:   Well developed, NAD, BMI noted. HEENT:  Normocephalic . Face symmetric, atraumatic. At the inferior aspect of the R tongue there is 1 or 2 mm, very shallow ulcer, white in color. Tongue is otherwise normal to inspection. Lungs:  CTA B Normal respiratory effort, no intercostal retractions, no accessory muscle use. Heart: RRR,  no murmur.  Lower extremities: no pretibial edema bilaterally  Skin: Not pale. Not jaundice Neurologic:  alert & oriented X3.  Speech normal, gait appropriate for age and unassisted Psych--  Cognition and judgment appear intact.  Cooperative with normal attention span and concentration.  Behavior appropriate. No anxious or depressed appearing.      Assessment       Assessment Hypothyroidism Hyperlipidemia: Started meds 09/2019 GERD Migraines insmonia-- xanax prn COPD: per chest CTs, former heavy smoker, quit 2005 [facility]- Pain mngmt  -- back pain, 2017 had a MRI, saw Dr Durward Fortes, Rx local injection, PT --History of fibromyalgia --H/o Pleuritic chest pain, on-off, resolved  --spinal stenosis (MRI 05/2018)  Osteopenia:  Per ENDO --T score 2011   -2.6 (intolerant to fosamax, ok w/ Boniva x years) --T score 01-2013   -2.6 --T score 02-2015  -2.3, rx  ca and cit D  Glaucoma Ocular Rosacea 2010 CV: LAD  artery disease  per CT scan of the chest, CP:  admitted, stress lest low risk 05-2015.  Control CV RF CAD, aortic sclerosis: Per CT  PLAN: Recurrent canker sores, stomatitis: See last visit, S96 and folic acid were normal, sxs unchanged, exam confirmed the presence of 1 canker sore, they are showing up in different places.  We discussed ENT referral, she prefers to wait. Pain management: Since the last visit did physical therapy with minimal help however on 09/14/2020  she had a local spine injection with great relief. Depression: See last visit, as the pain was getting under better control her mood improved and she never tried Cymbalta. Hypothyroidism: Check TSH Elevated BP: Slightly elevated today, typically normal, observation. RTC 6 months     This visit occurred during the SARS-CoV-2 public health emergency.  Safety protocols were in place, including screening questions prior to the visit, additional usage of staff PPE, and extensive cleaning of exam room while observing appropriate contact time as indicated for disinfecting solutions.

## 2020-09-30 NOTE — Assessment & Plan Note (Signed)
Recurrent canker sores, stomatitis: See last visit, G29 and folic acid were normal, sxs unchanged, exam confirmed the presence of 1 canker sore, they are showing up in different places.  We discussed ENT referral, she prefers to wait. Pain management: Since the last visit did physical therapy with minimal help however on 09/14/2020 she had a local spine injection with great relief. Depression: See last visit, as the pain was getting under better control her mood improved and she never tried Cymbalta. Hypothyroidism: Check TSH Elevated BP: Slightly elevated today, typically normal, observation. RTC 6 months

## 2020-10-05 ENCOUNTER — Ambulatory Visit: Payer: Medicare HMO | Admitting: Internal Medicine

## 2020-10-05 ENCOUNTER — Ambulatory Visit: Payer: Medicare HMO

## 2020-10-05 ENCOUNTER — Other Ambulatory Visit: Payer: Self-pay

## 2020-10-05 ENCOUNTER — Encounter: Payer: Self-pay | Admitting: Internal Medicine

## 2020-10-05 VITALS — BP 144/82 | HR 71 | Ht 64.0 in | Wt 188.2 lb

## 2020-10-05 DIAGNOSIS — M810 Age-related osteoporosis without current pathological fracture: Secondary | ICD-10-CM

## 2020-10-05 MED ORDER — DENOSUMAB 60 MG/ML ~~LOC~~ SOSY
60.0000 mg | PREFILLED_SYRINGE | Freq: Once | SUBCUTANEOUS | Status: AC
  Administered 2020-10-05: 60 mg via SUBCUTANEOUS

## 2020-10-05 NOTE — Patient Instructions (Addendum)
Please stop at the lab.  Please continue Prolia, vitamin D and calcium.  Please send me a message in November to order the new DXA scan.  Please come back for a follow-up appointment in 1 year.
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
## 2020-10-06 LAB — VITAMIN D 25 HYDROXY (VIT D DEFICIENCY, FRACTURES): Vit D, 25-Hydroxy: 46.9 ng/mL (ref 30.0–100.0)

## 2020-10-06 NOTE — Telephone Encounter (Signed)
Pt received Prolia inj 10/05/20 Next due 04/07/21

## 2020-10-18 ENCOUNTER — Encounter: Payer: Self-pay | Admitting: Internal Medicine

## 2020-10-18 ENCOUNTER — Ambulatory Visit (INDEPENDENT_AMBULATORY_CARE_PROVIDER_SITE_OTHER): Payer: Medicare HMO | Admitting: Internal Medicine

## 2020-10-18 ENCOUNTER — Other Ambulatory Visit: Payer: Self-pay

## 2020-10-18 VITALS — BP 138/82 | HR 74 | Temp 98.2°F | Resp 18 | Ht 64.0 in | Wt 188.2 lb

## 2020-10-18 DIAGNOSIS — R600 Localized edema: Secondary | ICD-10-CM | POA: Diagnosis not present

## 2020-10-18 LAB — URINALYSIS, ROUTINE W REFLEX MICROSCOPIC
Bilirubin Urine: NEGATIVE
Ketones, ur: NEGATIVE
Leukocytes,Ua: NEGATIVE
Nitrite: NEGATIVE
Specific Gravity, Urine: <=1.005 — AB (ref 1.000–1.030)
Total Protein, Urine: NEGATIVE
Urine Glucose: NEGATIVE
Urobilinogen, UA: 0.2 (ref 0.0–1.0)
pH: 5.5 (ref 5.0–8.0)

## 2020-10-18 NOTE — Patient Instructions (Signed)
Watch your salt intake even closer  Keep your legs elevated at least 1 hour at midmorning and 1 hour at a midafternoon  Call if you are not better    GO TO THE LAB : Get the blood work

## 2020-10-18 NOTE — Progress Notes (Signed)
Subjective:    Patient ID: [name], female    DOB: [birth year], 75 y.o.   MRN: [id number]  DOS:  10/18/2020 Type of visit - description: ACUTE  2 weeks ago, noted bilateral lower leg edema. It is worse at the end of the day. She did take a long driving trip from June 30 through July 4.  Edema started before the trip.  Legs were not warm, tender or red. We reviewed her medications, ambulatory BPs.   Wt Readings from Last 3 Encounters:  10/18/20 188 lb 4 oz (85.4 kg)  10/05/20 188 lb 3.2 oz (85.4 kg)  09/29/20 187 lb 8 oz (85 kg)     Review of Systems Denies fever chills No chest pain, difficulty breathing or palpitations. Pt  noted some urinary frequency and her urine to be more concentrated.  Past Medical History:  Diagnosis Date   Arthritis    Cataract    both eyes rt. 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   Fibromyalgia    see rheumatology   GERD (gastroesophageal reflux disease)    PAST HX    Hyperlipidemia    Hypothyroidism    Migraine    Ocular rosacea 03/2009   on doxy   Osteoporosis    osteopenia   Pleuritic chest pain    etiology unclear, has beeb eval by pulmonary and rheumatology (autoimmune dz?)   Wrist fracture 2018    Past Surgical History:  Procedure Laterality Date   ANTERIOR LAT LUMBAR FUSION N/A 12/03/2019   Procedure: ANTERIOR LATERAL LUMBAR FUSION L2-4;  Surgeon: Melina Schools, MD;  Location: [place];  Service: Orthopedics;  Laterality: N/A;  4 hrs   COLONOSCOPY     COSMETIC SURGERY  04/12/2018   L upper eyelid ptosis repair, and trichophytic brow lift   ESOPHAGOGASTRODUODENOSCOPY (EGD) WITH PROPOFOL N/A 03/27/2013   Procedure: ESOPHAGOGASTRODUODENOSCOPY (EGD) WITH PROPOFOL;  Surgeon: Milus Banister, MD;  Location: [facility];  Service:  Endoscopy;  Laterality: N/A;  possible dil   EYE SURGERY     growth on cornea-bil.   plastic surgery face  2013   POLYPECTOMY     right rotator cuff repair  2010   TONSILLECTOMY     UPPER GASTROINTESTINAL ENDOSCOPY      Allergies as of 10/18/2020       Reactions   Alendronate Sodium Hives   Bactrim [sulfamethoxazole-trimethoprim]    Upset stomach   Contrast Media [iodinated Diagnostic Agents] Hives   Iodine Hives   Milnacipran    REACTION: increased bp, increased hr   Penicillins Hives        Medication List        Accurate as of October 18, 2020 11:59 PM. If you have any questions, ask your nurse or doctor.          STOP taking these medications    celecoxib 200 MG capsule Commonly known as: CELEBREX Stopped by: Kathlene November, MD       TAKE these medications    Advair Diskus 100-50 MCG/ACT Aepb Generic drug: fluticasone-salmeterol Inhale 1 puff into the lungs in the morning and at bedtime.   ALPRAZolam 0.5 MG tablet Commonly known as: XANAX TAKE 1 TABLET BY MOUTH AT BEDTIME AS NEEDED FOR  ANXIETY   atorvastatin 10 MG tablet Commonly known as: LIPITOR Take 1 tablet (10 mg total) by mouth at bedtime.   calcium carbonate 1250 (500 Ca) MG chewable tablet Commonly known as: OS-CAL Chew 1 tablet by mouth 2 (two) times daily.   denosumab 60 MG/ML Sosy injection Commonly known as: PROLIA Inject 60 mg into the skin every 6 (six) months.   Estradiol 10 MCG Tabs vaginal tablet Commonly known as: Yuvafem PLACE 1 TABLET VAGINALLY 2 TIMES A WEEK.   HYDROcodone-acetaminophen 7.5-325 MG tablet Commonly known as: NORCO Take 1 tablet by mouth every 6 (six) hours as needed for moderate pain.   hydroxypropyl methylcellulose / hypromellose 2.5 % ophthalmic solution Commonly known as: ISOPTO TEARS / GONIOVISC Place 1 drop into both eyes 3 (three) times daily as needed for dry eyes.   levothyroxine 88 MCG tablet Commonly known as: SYNTHROID Take 1 tablet (88 mcg total)  by mouth daily before breakfast.   multivitamin capsule Take 1 capsule by mouth daily.   Narcan 4 MG/0.1ML Liqd nasal spray kit Generic drug: naloxone Use as directed   Probiotic-10 Chew Chew 1 tablet by mouth daily.   Vitamin D3 50 MCG (2000 UT) Chew Chew 2,000 Units by mouth daily.           Objective:   Physical Exam BP 138/82 (BP Location: Left Arm, Patient Position: Sitting, Cuff Size: Normal)   Pulse 74   Temp 98.2 F (36.8 C) (Oral)   Resp 18   Ht _0  (1.626 m)   Wt 188 lb 4 oz (85.4 kg)   LMP 04/10/1998 (Approximate)   SpO2 98%   BMI 32.31 kg/m  General:   Well developed, NAD, BMI noted. HEENT:  Normocephalic . Face symmetric, atraumatic Neck: No JVD Lungs:  CTA B Normal respiratory effort, no intercostal retractions, no accessory muscle use. Heart: RRR,  no murmur.  Lower extremities: no pretibial edema bilaterally  Skin: Not pale. Not jaundice Neurologic:  alert & oriented X3.  Speech normal, gait appropriate for age and unassisted Psych--  Cognition and judgment appear intact.  Cooperative with normal attention span and concentration.  Behavior appropriate. No anxious or depressed appearing.    Leg swelling from few days ago:     Today        Assessment   Assessment Hypothyroidism Hyperlipidemia: Started meds 09/2019 GERD Migraines insmonia-- xanax prn COPD: per chest CTs, former heavy smoker, quit 2005 [facility]- Pain mngmt  -- back pain, 2017 had a MRI, saw Dr Durward Fortes, Rx local injection, PT --History of fibromyalgia --H/o Pleuritic chest pain, on-off, resolved  --spinal stenosis (MRI 05/2018)  Osteopenia:  Per ENDO --T score 2011   -2.6 (intolerant to fosamax, ok w/ Boniva x years) --T score 01-2013   -2.6 --T score 02-2015  -2.3, rx ca and cit D  Glaucoma Ocular Rosacea 2010 CV: LAD  artery disease  per CT scan of the chest, CP:  admitted, stress lest low risk 05-2015.  Control CV RF CAD, aortic sclerosis: Per  CT  PLAN: Lower extremity edema: Unclear etiology, no cardiopulmonary symptoms, no JVD, weight is stable.  Denies eating more salt. She is not taking Celebrex and stopped Advair.  Did notice her urine to be more concentrated. Plan: Continue with low-salt diet, leg elevation, CMP.  UA urine culture.  Call if not gradually better.  Consider diuretics. Pain management: Self stop Celebrex Canker sores, stomatitis: See last OV, still have some symptoms, self stop Advair and that is  okay as long as she doesn't have respiratory symptoms     This visit occurred during the SARS-CoV-2 public health emergency.  Safety protocols were in place, including screening questions prior to the visit, additional usage of staff PPE, and extensive cleaning of exam room while observing appropriate contact time as indicated for disinfecting solutions.

## 2020-10-19 LAB — COMPREHENSIVE METABOLIC PANEL
ALT: 13 U/L (ref 0–35)
AST: 14 U/L (ref 0–37)
Albumin: 4.4 g/dL (ref 3.5–5.2)
Alkaline Phosphatase: 78 U/L (ref 39–117)
BUN: 14 mg/dL (ref 6–23)
CO2: 27 mEq/L (ref 19–32)
Calcium: 9.3 mg/dL (ref 8.4–10.5)
Chloride: 103 mEq/L (ref 96–112)
Creatinine, Ser: 0.82 mg/dL (ref 0.40–1.20)
GFR: 70.36 mL/min (ref >60.00)
Glucose, Bld: 77 mg/dL (ref 70–99)
Potassium: 4.4 mEq/L (ref 3.5–5.1)
Sodium: 138 mEq/L (ref 135–145)
Total Bilirubin: 0.6 mg/dL (ref 0.2–1.2)
Total Protein: 6.8 g/dL (ref 6.0–8.3)

## 2020-10-19 LAB — URINE CULTURE
MICRO NUMBER:: 12103154
Result:: NO GROWTH
SPECIMEN QUALITY:: ADEQUATE

## 2020-10-19 NOTE — Assessment & Plan Note (Signed)
Lower extremity edema: Unclear etiology, no cardiopulmonary symptoms, no JVD, weight is stable.  Denies eating more salt. She is not taking Celebrex and stopped Advair.  Did notice her urine to be more concentrated. Plan: Continue with low-salt diet, leg elevation, CMP.  UA urine culture.  Call if not gradually better.  Consider diuretics. Pain management: Self stop Celebrex Canker sores, stomatitis: See last OV, still have some symptoms, self stop Advair and that is okay as long as she doesn't have respiratory symptoms

## 2020-10-21 ENCOUNTER — Telehealth: Payer: Self-pay | Admitting: Internal Medicine

## 2020-10-22 MED ORDER — HYDROCODONE-ACETAMINOPHEN 7.5-325 MG PO TABS: 1.0000 | ORAL_TABLET | Freq: Four times a day (QID) | ORAL | 0 refills | Status: DC | PRN

## 2020-10-22 NOTE — Telephone Encounter (Signed)
PDMP okay, Rx sent 

## 2020-10-22 NOTE — Telephone Encounter (Signed)
Requesting: hydrocodone 7.5-325mg  Contract: 01/29/2020 UDS: 01/29/2020 Last Visit:10/18/2020 Next Visit: 03/24/2021 Last Refill: 09/23/2020 #120 and 0RF Pt sig: 1 tab q6h prn  Please Advise

## 2020-10-28 ENCOUNTER — Ambulatory Visit (HOSPITAL_BASED_OUTPATIENT_CLINIC_OR_DEPARTMENT_OTHER): Payer: Medicare HMO | Attending: Orthopedic Surgery | Admitting: Physical Therapy

## 2020-10-28 ENCOUNTER — Encounter (HOSPITAL_BASED_OUTPATIENT_CLINIC_OR_DEPARTMENT_OTHER): Payer: Self-pay | Admitting: Physical Therapy

## 2020-10-28 ENCOUNTER — Other Ambulatory Visit: Payer: Self-pay

## 2020-10-28 DIAGNOSIS — R262 Difficulty in walking, not elsewhere classified: Secondary | ICD-10-CM | POA: Insufficient documentation

## 2020-10-28 DIAGNOSIS — M25551 Pain in right hip: Secondary | ICD-10-CM

## 2020-10-28 DIAGNOSIS — M6281 Muscle weakness (generalized): Secondary | ICD-10-CM

## 2020-10-28 NOTE — Therapy (Signed)
[facility] [address] [place], [place], [address] Phone: [phone]   Fax:  [phone]  Physical Therapy Evaluation  Patient Details  Name: [name] MRN: [id number] Date of Birth: Jul 07, 1945 Referring Provider (PT): Colon Branch, MD   Encounter Date: 10/28/2020   PT End of Session - 10/28/20 1404     Visit Number 1    Number of Visits 17    Date for PT Re-Evaluation 01/26/21    Authorization Type [employer]    PT Start Time 1103    PT Stop Time 9798    PT Time Calculation (min) 42 min    Activity Tolerance Patient tolerated treatment well;Patient limited by pain    Behavior During Therapy [facility] for tasks assessed/performed             Past Medical History:  Diagnosis Date   Arthritis    Cataract    both eyes rt. 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   Fibromyalgia    see rheumatology   GERD (gastroesophageal reflux disease)    PAST HX    Hyperlipidemia    Hypothyroidism    Migraine    Ocular rosacea 03/2009   on doxy   Osteoporosis    osteopenia   Pleuritic chest pain    etiology unclear, has beeb eval by pulmonary and rheumatology (autoimmune dz?)   Wrist fracture 2018    Past Surgical History:  Procedure Laterality Date   ANTERIOR LAT LUMBAR FUSION N/A 12/03/2019   Procedure: ANTERIOR LATERAL LUMBAR FUSION L2-4;  Surgeon: Melina Schools, MD;  Location: [facility];  Service: Orthopedics;  Laterality: N/A;  4 hrs   COLONOSCOPY     COSMETIC SURGERY  04/12/2018   L upper eyelid ptosis repair, and trichophytic brow lift   ESOPHAGOGASTRODUODENOSCOPY (EGD) WITH PROPOFOL N/A 03/27/2013   Procedure: ESOPHAGOGASTRODUODENOSCOPY (EGD) WITH PROPOFOL;  Surgeon: Milus Banister, MD;  Location: [facility];   Service: Endoscopy;  Laterality: N/A;  possible dil   EYE SURGERY     growth on cornea-bil.   plastic surgery face  2013   POLYPECTOMY     right rotator cuff repair  2010   TONSILLECTOMY     UPPER GASTROINTESTINAL ENDOSCOPY      There were no vitals filed for this visit.    Subjective Assessment - 10/28/20 1107     Subjective Pt has R sided hip and thigh pain and weakness.  Pt describes pain as achey and deep within the hip. Pain does not cross knee. Current 4/10, Best 0/10, Worst 9/10. Pt states the pain started about 6 months ago. She went to PT at [facility] from Oct to Dec in 2021 and did not get better. She stated it started to decline. She then went to [facility] for PT again. She reports it was still getting worse and got a steroid injection. She states she is here for aquatic therapy. Pt denies NT into the R LE. Aggs: stairs, pivoting, sleeping on L side, R LE WB, getting in and out of cars, walking without support, sitting too long; Eases: sitting/resting, ice, meds. Denies pain with cough, sneezing, laughing. Pt states she sits for most of the day ~ 8 hrs. Pt does currently walk  for 15 mins a day currently for exercise. Pt states she is not doing previous PT exercises. Pt denies unexpected weight loss but will have night pain after increased activity.    Pertinent History wrist fx, pleuritic chest pain, osteoporosis, migraine, hypothyroidism, glaucoma, fibromyalgia, R RTC repair, anterior lateral lumbar fusion L2-12 Nov 2019    Patient Stated Goals Pt states she would be able to "funciton" and walk without "looking like an old lady."    Currently in Pain? Yes    Pain Score 4     Pain Location Hip    Pain Orientation Right    Pain Descriptors / Indicators Aching;Dull                [facility] PT Assessment - 10/28/20 0001       Assessment   Medical Diagnosis M54.9 (ICD-10-CM) - Upper back pain    Referring Provider (PT) Colon Branch, MD    Next MD Visit 6 weeks    Prior Therapy  Mid/upper back pain      Precautions   Precautions None      Restrictions   Weight Bearing Restrictions No      Balance Screen   Has the patient fallen in the past 6 months No    Has the patient had a decrease in activity level because of a fear of falling?  No    Is the patient reluctant to leave their home because of a fear of falling?  No      Home Ecologist residence      Prior Function   Level of Independence Independent      Cognition   Overall Cognitive Status Within Functional Limits for tasks assessed      Observation/Other Assessments   Other Surveys  Lower Extremity Functional Scale    Lower Extremity Functional Scale  to be taken at next session      Functional Tests   Functional tests Sit to Stand;Single leg stance      Single Leg Stance   Comments decreased hip extension on R, L trunk rotation, decreased eccentric lower control      Sit to Stand   Comments pain with WB, Trendelenberg      ROM / Strength   AROM / PROM / Strength AROM;PROM;Strength      AROM   Overall AROM Comments L/S moderately limited in all directions; R hip flexion 110, ext 0 deg, IR 10, ER 30 with pain; L hip limited in motion but with no pain      PROM   Overall PROM Comments R hip flex 125 with pain, ext 10, IR 20 with pain, ER 40 with pain      Strength   Overall Strength Comments L hip 4+/5 throughout, R hip 4/5 throughout      Flexibility   Soft Tissue Assessment /Muscle Length yes    Quadriceps limited knee flexion on R    ITB WFL    Piriformis taut to palpation, limited IR and ER    Quadratus Lumborum taut to palpation, limited L/S due to fusion      Palpation   SI assessment  R PSIS and sacral border tenderness    Palpation comment signficant TTP R gluteals and hip rotators      Special Tests    Special Tests Hip Special Tests    Hip Special Tests  Saralyn Pilar (FABER) Test;Piriformis Test;Anterior Hip Impingement Test      Saralyn Pilar  (  FABER) Test   Findings Positive      Anterior Hip Impingement Test    Findings Positive      Piriformis Test   Findings Positive      Transfers   Five time sit to stand comments  17.4s      Ambulation/Gait   Ambulation/Gait Yes    Ambulation Distance (Feet) 35 Feet    Gait Pattern Decreased stance time - right;Decreased hip/knee flexion - right;Trendelenburg;Lateral trunk lean to left;Decreased trunk rotation                        Objective measurements completed on examination: See above findings.       [facility] PT Treatment/Exercise - 10/28/20 0001       Lumbar Exercises: Stretches   Single Knee to Chest Stretch Limitations 10x 5s knees bent      Lumbar Exercises: Supine   Other Supine Lumbar Exercises supine hip ABD isometric 5s 10x      Manual Therapy   Manual Therapy Soft tissue mobilization;Joint mobilization    Joint Mobilization R LE LAD grade II-III    Soft tissue mobilization R hip rotators and gluteals                    PT Education - 10/28/20 1123     Education Details MOI, diagnosis, prognosis, anatomy, exercise progression, DOMS expectations, muscle firing, HEP, joint protection, postural changes, POC    Person(s) Educated Patient    Methods Explanation;Demonstration;Verbal cues;Handout;Tactile cues    Comprehension Verbalized understanding;Returned demonstration;Verbal cues required;Tactile cues required              PT Short Term Goals - 10/28/20 1413       PT SHORT TERM GOAL #1   Title Pt will become independent with HEP in order to demonstrate synthesis of PT education.    Time 2    Period Weeks    Status New      PT SHORT TERM GOAL #2   Title Pt will be able to demonstrate STS with full hip extension in order to demonstrate functional improvement in R hip strength and ROM for self-care and house hold duties.    Time 4    Period Weeks    Status New      PT SHORT TERM GOAL #3   Title Pt will have an at  least 9 pt improvement in LEFS measure in order to demonstrate MCID improvement in daily function.    Baseline LEFS to be taken at next session    Time 4    Period Weeks    Status New               PT Long Term Goals - 10/28/20 1415       PT LONG TERM GOAL #1   Title Pt will become independent with final HEP in order to demonstrate synthesis of PT education.    Time 8    Period Weeks    Status New      PT LONG TERM GOAL #2   Title Pt will be able to perform 5XSTS in under 12  in order to demonstrate functional improvement above the cut off score for older adults.    Time 8    Period Weeks    Status New      PT LONG TERM GOAL #3   Title Pt will demonstrate/report ability to stand/sit/sleep without pain in order to demonstrate  functional improvement and tolerance to static positioning.    Time 8    Period Weeks    Status New      PT LONG TERM GOAL #4   Title Pt will be able to demonstrate full floor to stand, kneeling to stand, and stand to kneeling transfer in order to demonstrate functional improvement in LE function for house hold duties and gardening activities.    Time 8    Period Weeks    Status New   better with STM but still limited by tightness on the R LE                                    Plan - 10/28/20 1405     Clinical Impression Statement Pt is a 75 y.o. female presenting to PT eval today for CC of R sided hip and thigh pain. Pt presents with R LE weakness, gait deviations, soft tissue restrictions, and decreased R hip ROM. Pt's s/s appear consistent with GTPS or a gluteal tendinopathy based on insidious onset clinical presentation. Testing does not suggest hip labral defect. Pt's pain is moderately sensitive and irritable, though highly tender to palpation. Pt's impairments limit participation with ADL, exercise, and recreation.  Pt would benefit from continued skilled therapy in order to reach goals and maximize functional R hip strength and  ROM for full return to PLOF.    Personal Factors and Comorbidities Age;Comorbidity 3+;Fitness;Past/Current Experience;Time since onset of injury/illness/exacerbation    Comorbidities wrist fx, pleuritic chest pain, osteoporosis, migraine, hypothyroidism, glaucoma, fibromyalgia, R RTC repair, anterior lateral lumbar fusion L2-4 2021    Examination-Activity Limitations Bed Mobility;Sleep;Sit;Bend;Squat;Stairs;Carry;Stand;Transfers;Lift;Locomotion Level    Examination-Participation Restrictions Church;Cleaning;Community Activity;Driving;Laundry;Meal Prep;Shop    Stability/Clinical Decision Making Stable/Uncomplicated    Clinical Decision Making Low    Rehab Potential Good    PT Frequency 2x / week    PT Duration 8 weeks    PT Treatment/Interventions ADLs/Self Care Home Management;Cryotherapy;Electrical Stimulation;Moist Heat;Balance training;Therapeutic exercise;Therapeutic activities;Functional mobility training;Stair training;Gait training;Ultrasound;Neuromuscular re-education;Patient/family education;Manual techniques;Vasopneumatic Device;Taping;Energy conservation;Dry needling;Passive range of motion;Aquatic Therapy;Iontophoresis 4mg /ml Dexamethasone;Traction;Spinal Manipulations;Joint Manipulations;Scar mobilization;Orthotic Fit/Training    PT Home Exercise Plan Access Code: [email or web address]    [email or web address] and Agree with Plan of Care Patient             Patient will benefit from skilled therapeutic intervention in order to improve the following deficits and impairments:  Abnormal gait, Hypomobility, Decreased activity tolerance, Decreased strength, Pain, Increased fascial restricitons, Decreased balance, Difficulty walking, Increased muscle spasms, Improper body mechanics, Decreased range of motion, Impaired flexibility, Postural dysfunction, Decreased mobility  Visit Diagnosis: Muscle weakness (generalized)  Pain in right hip  Difficulty walking     Problem List Patient Active Problem List    Diagnosis Date Noted   Fusion of lumbar spine 12/03/2019   COPD (chronic obstructive pulmonary disease) ([place]) 08/22/2019   Spinal stenosis of lumbar region with neurogenic claudication 11/22/2018   Coronary artery disease due to lipid rich plaque 10/03/2018   Closed fracture of distal end of radius 04/16/2017   Chronic back pain 03/15/2017   PCP NOTES >>>>>>>>>>>>>>>>>>>> 05/24/2015   Numbness and tingling of right arm 05/22/2015   Insomnia 09/01/2014   GERD (gastroesophageal reflux disease) 03/27/2013   Dysphagia, pharyngoesophageal phase 03/27/2013   Annual physical exam 06/12/2011   Sinusitis, chronic 02/02/2011   Osteoporosis 06/08/2010   Fibromyalgia, pain mngmt 01/29/2009   LIPOMA 09/30/2008   Migraine without  aura 03/04/2007   Hypothyroidism 05/15/2006   COLONIC POLYPS, HX OF 05/15/2006   Daleen Bo PT, DPT 10/28/20 2:25 PM   [facility] [address] [place], [place], [address] Phone: [phone]   Fax:  [phone]  Name: [name] MRN: [id number] Date of Birth: 1945/10/07

## 2020-10-28 NOTE — Patient Instructions (Signed)
Access Code: [email or web address] URL: [email or web address] Date: 10/28/2020 Prepared by: Daleen Bo  Exercises Hooklying Isometric Clamshell - 2 x daily - 7 x weekly - 2 sets - 10 reps - 5 hold Hooklying Single Knee to Chest Stretch - 2 x daily - 7 x weekly - 2 sets - 10 reps - 5 hold

## 2020-11-02 DIAGNOSIS — H04332 Acute lacrimal canaliculitis of left lacrimal passage: Secondary | ICD-10-CM | POA: Diagnosis not present

## 2020-11-02 DIAGNOSIS — H04123 Dry eye syndrome of bilateral lacrimal glands: Secondary | ICD-10-CM | POA: Diagnosis not present

## 2020-11-02 NOTE — Progress Notes (Signed)
Cardiology Office Note:    Date:  11/09/2020   ID:  [name], DOB 1946-03-10, MRN [id number]  PCP:  Colon Branch, MD  Cardiologist:  Jenkins Rouge, MD   Electrophysiologist:  None   Referring MD: Colon Branch, MD   Chief Complaint:  No chief complaint on file.    Patient Profile:    [name] is a 75 y.o. female with:  Hypothyroidism Hyperlipidemia  Migraine HAs Fibromyalgia  Chest pain, Palpitations   Evaluation in 1/17 >> Rare PVCs on tele; Myoview neg for ischemia  Ex-smoker Coronary artery Ca2+ on CT 09/2018 Myoview 12/2018: low risk  Aortic atherosclerosis COPD  Prior CV studies: Myoview 12/23/2018 EF 74, no ischemia; low risk   History of Present Illness:    75 y.o. not seen by me since 2020. History of hypothyroidism, fibromyalgia atypical chest pain with normal myovue in 2017.and again in September of 2020 Benign palpitations with rare PvCls on monitor She is a previous heavy smoker with COPD She has had multiple back surgeries Seen by primary and noted some LE edema with no other congestive symptoms BMET and UA where normal   She is from [employer] in [place] and worked for [employer] She has had worse dependant edema and is not on diuretic   Past Medical History:  Diagnosis Date   Arthritis    Cataract    both eyes rt. eye was removed   Chest pain    stress test @ [place] [facility] (-)   Colonic polyp    bx adenomatous polyps, next scope 2010   COPD (chronic obstructive pulmonary disease) ([place])    Cornea disorder    NODULES REMOVED FROM CORNEA   Fibromyalgia    see rheumatology   GERD (gastroesophageal reflux disease)    PAST HX    Hyperlipidemia    Hypothyroidism    Migraine    Ocular rosacea 03/2009   on doxy   Osteoporosis    osteopenia   Pleuritic chest pain    etiology unclear, has beeb eval by pulmonary and rheumatology (autoimmune dz?)   Wrist fracture 2018    Current Medications: Current Meds  Medication Sig    ADVAIR DISKUS 100-50 MCG/DOSE AEPB Inhale 1 puff into the lungs in the morning and at bedtime.   ALPRAZolam (XANAX) 0.5 MG tablet TAKE 1 TABLET BY MOUTH AT BEDTIME AS NEEDED FOR ANXIETY   atorvastatin (LIPITOR) 10 MG tablet Take 1 tablet (10 mg total) by mouth at bedtime.   calcium carbonate (OS-CAL) 1250 (500 CA) MG chewable tablet Chew 1 tablet by mouth 2 (two) times daily.    Cholecalciferol (VITAMIN D3) 50 MCG (2000 UT) CHEW Chew 2,000 Units by mouth daily.    denosumab (PROLIA) 60 MG/ML SOSY injection Inject 60 mg into the skin every 6 (six) months.   Estradiol (YUVAFEM) 10 MCG TABS vaginal tablet PLACE 1 TABLET VAGINALLY 2 TIMES A WEEK.   hydrochlorothiazide (MICROZIDE) 12.5 MG capsule Take 1 capsule (12.5 mg total) by mouth daily.   HYDROcodone-acetaminophen (NORCO) 7.5-325 MG tablet Take 1 tablet by mouth every 6 (six) hours as needed for moderate pain.   hydroxypropyl methylcellulose / hypromellose (ISOPTO TEARS / GONIOVISC) 2.5 % ophthalmic solution Place 1 drop into both eyes 3 (three) times daily as needed for dry eyes.   levothyroxine (SYNTHROID) 88 MCG tablet Take 1 tablet (88 mcg total) by mouth daily before breakfast.   Multiple Vitamin (MULTIVITAMIN) capsule Take 1 capsule by mouth daily.  naloxone (NARCAN) 4 MG/0.1ML LIQD nasal spray kit Use as directed   Probiotic Product (PROBIOTIC-10) CHEW Chew 1 tablet by mouth daily.     Allergies:   Alendronate sodium, Bactrim [sulfamethoxazole-trimethoprim], Contrast media [iodinated diagnostic agents], Iodine, Milnacipran, and Penicillins   Social History   Tobacco Use   Smoking status: Former    Packs/day: 1.00    Years: 38.50    Pack years: 38.50    Types: Cigarettes    Quit date: 01/13/2004    Years since quitting: 16.8   Smokeless tobacco: Never   Tobacco comments:    quit 2005  Vaping Use   Vaping Use: Never used  Substance Use Topics   Alcohol use: No    Alcohol/week: 0.0 standard drinks    Comment: rare   Drug  use: No     Family Hx: The patient's family history is negative for Cancer, Depression, Diabetes, Stroke, Hypertension, and Heart disease. She was adopted.  ROS   EKGs/Labs/Other Test Reviewed:    EKG:  EKG is  ordered today.  The ekg ordered today demonstrates normal sinus rhythm, heart rate 71, normal axis, no ST-T wave changes, QTC 454  Recent Labs: 06/01/2020: Hemoglobin 12.8; Platelets 239.0 09/29/2020: TSH 0.66 10/18/2020: ALT 13; BUN 14; Creatinine, Ser 0.82; Potassium 4.4; Sodium 138   Recent Lipid Panel Lab Results  Component Value Date/Time   CHOL 122 01/29/2020 10:56 AM   TRIG 108 01/29/2020 10:56 AM   TRIG 133 02/21/2006 09:54 AM   HDL 51 01/29/2020 10:56 AM   CHOLHDL 2.4 01/29/2020 10:56 AM   LDLCALC 52 01/29/2020 10:56 AM   LDLDIRECT 92.0 03/04/2007 09:33 AM    Physical Exam:    VS:  BP 130/80   Pulse 75   Ht '5\' 4"'  (1.626 m)   Wt 85.3 kg   LMP 04/10/1998 (Approximate)   SpO2 99%   BMI 32.27 kg/m     Wt Readings from Last 3 Encounters:  11/09/20 85.3 kg  10/18/20 85.4 kg  10/05/20 85.4 kg     Affect appropriate Healthy:  appears stated age 61: normal Neck supple with no adenopathy JVP normal no bruits no thyromegaly Lungs clear with no wheezing and good diaphragmatic motion Heart:  S1/S2 no murmur, no rub, gallop or click PMI normal Abdomen: benighn, BS positve, no tenderness, no AAA no bruit.  No HSM or HJR Distal pulses intact with no bruits Plus one bilateral edema Neuro non-focal Skin warm and dry No muscular weakness     ASSESSMENT & PLAN:    Chest pain: atypical normal myovue x 2 most recently 2020 observe COPD quit smoking around 2006 f/u primary regarding lung cancer CT needs  Thyroid: continue synthroid replacement  HLD:  on statin labs with primary Had some LAD calcium and aortic atherosclerosis noted on lung CT 2020 Edema:  dependant from venous dx and obesity HCTZ 12.5 mg daily prescribed f/u BMET in 3 weeks   F/U in 6  months HCTZ 12.5 mg daily  BMET 3 weeks     Medication Adjustments/Labs and Tests Ordered: Current medicines are reviewed at length with the patient today.  Concerns regarding medicines are outlined above.  Tests Ordered: Orders Placed This Encounter  Procedures   Basic metabolic panel    Medication Changes: Meds ordered this encounter  Medications   hydrochlorothiazide (MICROZIDE) 12.5 MG capsule    Sig: Take 1 capsule (12.5 mg total) by mouth daily.    Dispense:  90 capsule    Refill:  3     Signed, Jenkins Rouge, MD  11/09/2020 10:48 AM    [facility] [place], [place], [place]  [address] Phone: [phone]; Fax: [phone]

## 2020-11-08 ENCOUNTER — Ambulatory Visit (HOSPITAL_BASED_OUTPATIENT_CLINIC_OR_DEPARTMENT_OTHER): Payer: Medicare HMO | Attending: Orthopedic Surgery | Admitting: Physical Therapy

## 2020-11-08 ENCOUNTER — Other Ambulatory Visit: Payer: Self-pay

## 2020-11-08 ENCOUNTER — Encounter (HOSPITAL_BASED_OUTPATIENT_CLINIC_OR_DEPARTMENT_OTHER): Payer: Self-pay | Admitting: Physical Therapy

## 2020-11-08 DIAGNOSIS — M25551 Pain in right hip: Secondary | ICD-10-CM

## 2020-11-08 DIAGNOSIS — M6281 Muscle weakness (generalized): Secondary | ICD-10-CM

## 2020-11-08 DIAGNOSIS — R262 Difficulty in walking, not elsewhere classified: Secondary | ICD-10-CM

## 2020-11-08 NOTE — Patient Instructions (Signed)
Access Code: [email or web address] URL: [email or web address] Date: 11/08/2020 Prepared by: Daleen Bo  Exercises Hooklying Isometric Clamshell - 2 x daily - 7 x weekly - 2 sets - 10 reps - 5 hold Hooklying Single Knee to Chest Stretch - 2 x daily - 7 x weekly - 2 sets - 10 reps - 5 hold Seated Pelvic Tilt - 3-4 x daily - 7 x weekly - 1 sets - 15-20 reps Seated Flexion Stretch with Swiss [place] - 2 x daily - 7 x weekly - 1 sets - 10 reps - 5 hold
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
## 2020-11-09 ENCOUNTER — Ambulatory Visit: Payer: Medicare HMO | Admitting: Cardiovascular Disease

## 2020-11-09 ENCOUNTER — Encounter: Payer: Self-pay | Admitting: Cardiovascular Disease

## 2020-11-09 VITALS — BP 130/80 | HR 75 | Ht 64.0 in | Wt 188.0 lb

## 2020-11-09 DIAGNOSIS — R609 Edema, unspecified: Secondary | ICD-10-CM

## 2020-11-09 DIAGNOSIS — R079 Chest pain, unspecified: Secondary | ICD-10-CM

## 2020-11-09 DIAGNOSIS — I493 Ventricular premature depolarization: Secondary | ICD-10-CM

## 2020-11-09 DIAGNOSIS — Z79899 Other long term (current) drug therapy: Secondary | ICD-10-CM

## 2020-11-09 MED ORDER — HYDROCHLOROTHIAZIDE 12.5 MG PO CAPS: 12.5000 mg | ORAL_CAPSULE | Freq: Every day | ORAL | 3 refills | Status: DC

## 2020-11-09 NOTE — Patient Instructions (Signed)
Medication Instructions:  Your physician recommends that you continue on your current medications as directed. Please refer to the Current Medication list given to you today.  HCTZ 12.5 mg Daily   *If you need a refill on your cardiac medications before your next appointment, please call your pharmacy*   Lab Work: Your physician recommends that you return for lab work in:  3 Weeks   If you have labs (blood work) drawn today and your tests are completely normal, you will receive your results only by: [employer] (if you have MyChart) OR A paper copy in the mail If you have any lab test that is abnormal or we need to change your treatment, we will call you to review the results.   Testing/Procedures: NONE    Follow-Up: At [facility], you and your health needs are our priority.  As part of our continuing mission to provide you with exceptional heart care, we have created designated Provider Care Teams.  These Care Teams include your primary Cardiologist (physician) and Advanced Practice Providers (APPs -  Physician Assistants and Nurse Practitioners) who all work together to provide you with the care you need, when you need it.  We recommend signing up for the patient portal called "MyChart".  Sign up information is provided on this After Visit Summary.  MyChart is used to connect with patients for Virtual Visits (Telemedicine).  Patients are able to view lab/test results, encounter notes, upcoming appointments, etc.  Non-urgent messages can be sent to your provider as well.   To learn more about what you can do with MyChart, go to [email or web address].    Your next appointment:   1 year(s)  The format for your next appointment:   In Person  Provider:   Jenkins Rouge, MD   Other Instructions Thank you for choosing [facility]!

## 2020-11-11 ENCOUNTER — Other Ambulatory Visit: Payer: Self-pay

## 2020-11-11 ENCOUNTER — Ambulatory Visit (HOSPITAL_BASED_OUTPATIENT_CLINIC_OR_DEPARTMENT_OTHER): Payer: Medicare HMO | Admitting: Physical Therapy

## 2020-11-11 ENCOUNTER — Encounter (HOSPITAL_BASED_OUTPATIENT_CLINIC_OR_DEPARTMENT_OTHER): Payer: Self-pay | Admitting: Physical Therapy

## 2020-11-11 DIAGNOSIS — R262 Difficulty in walking, not elsewhere classified: Secondary | ICD-10-CM

## 2020-11-11 DIAGNOSIS — M6281 Muscle weakness (generalized): Secondary | ICD-10-CM | POA: Diagnosis not present

## 2020-11-11 DIAGNOSIS — M25551 Pain in right hip: Secondary | ICD-10-CM
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
## 2020-11-16 DIAGNOSIS — Z4889 Encounter for other specified surgical aftercare: Secondary | ICD-10-CM | POA: Diagnosis not present

## 2020-11-17 ENCOUNTER — Other Ambulatory Visit: Payer: Self-pay

## 2020-11-17 ENCOUNTER — Ambulatory Visit (HOSPITAL_BASED_OUTPATIENT_CLINIC_OR_DEPARTMENT_OTHER): Payer: Medicare HMO | Admitting: Physical Therapy

## 2020-11-17 ENCOUNTER — Encounter (HOSPITAL_BASED_OUTPATIENT_CLINIC_OR_DEPARTMENT_OTHER): Payer: Self-pay | Admitting: Physical Therapy

## 2020-11-17 DIAGNOSIS — R262 Difficulty in walking, not elsewhere classified: Secondary | ICD-10-CM

## 2020-11-17 DIAGNOSIS — M25551 Pain in right hip: Secondary | ICD-10-CM

## 2020-11-17 DIAGNOSIS — M6281 Muscle weakness (generalized): Secondary | ICD-10-CM

## 2020-11-17 NOTE — Therapy (Signed)
[facility] [address] [place], [place], [address] Phone: [phone]   Fax:  [phone]  Physical Therapy Treatment  Patient Details  Name: [name] MRN: [id number] Date of Birth: 1945/08/17 Referring Provider (PT): Colon Branch, MD   Encounter Date: 11/17/2020   PT End of Session - 11/17/20 1152     Visit Number 4    Number of Visits 17    Date for PT Re-Evaluation 01/26/21    Authorization Type [employer]    PT Start Time [id number]    PT Stop Time 1230    PT Time Calculation (min) 45 min    Activity Tolerance Patient tolerated treatment well;Patient limited by pain    Behavior During Therapy [facility] for tasks assessed/performed             Past Medical History:  Diagnosis Date   Arthritis    Cataract    both eyes rt. 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   Fibromyalgia    see rheumatology   GERD (gastroesophageal reflux disease)    PAST HX    Hyperlipidemia    Hypothyroidism    Migraine    Ocular rosacea 03/2009   on doxy   Osteoporosis    osteopenia   Pleuritic chest pain    etiology unclear, has beeb eval by pulmonary and rheumatology (autoimmune dz?)   Wrist fracture 2018    Past Surgical History:  Procedure Laterality Date   ANTERIOR LAT LUMBAR FUSION N/A 12/03/2019   Procedure: ANTERIOR LATERAL LUMBAR FUSION L2-4;  Surgeon: Melina Schools, MD;  Location: [facility];  Service: Orthopedics;  Laterality: N/A;  4 hrs   COLONOSCOPY     COSMETIC SURGERY  04/12/2018   L upper eyelid ptosis repair, and trichophytic brow lift   ESOPHAGOGASTRODUODENOSCOPY (EGD) WITH PROPOFOL N/A 03/27/2013   Procedure: ESOPHAGOGASTRODUODENOSCOPY (EGD) WITH PROPOFOL;  Surgeon: Milus Banister, MD;  Location: [facility];   Service: Endoscopy;  Laterality: N/A;  possible dil   EYE SURGERY     growth on cornea-bil.   plastic surgery face  2013   POLYPECTOMY     right rotator cuff repair  2010   TONSILLECTOMY     UPPER GASTROINTESTINAL ENDOSCOPY      There were no vitals filed for this visit.   Subjective Assessment - 11/17/20 1148     Subjective Pt states the back pain is better. "Today is a good day." She has adjusted her medication and has decreased the amount her legs need to be propped up while sleeping.    Pertinent History wrist fx, pleuritic chest pain, osteoporosis, migraine, hypothyroidism, glaucoma, fibromyalgia, R RTC repair, anterior lateral lumbar fusion L2-12 Nov 2019    Patient Stated Goals Pt states she would be able to "funciton" and walk without "looking like an old lady."    Currently in Pain? Yes    Pain Score 3     Pain Location Back    Pain Orientation Right    Pain Descriptors / Indicators Aching                 [facility] Adult PT Treatment/Exercise - 11/17/20 0001       Lumbar Exercises: Stretches   Single Knee  to Chest Stretch Limitations 10x 5s knees bent    Lower Trunk Rotation Limitations 15x 3s        Other Lumbar Stretch Exercise child's pose 10s 10x      Lumbar Exercises: Aerobic   Nustep L2 11mn      Lumbar Exercises: Standing   Other Standing Lumbar Exercises sidestepping RTB at knees 2x length of rail      Lumbar Exercises: Seated   Sit to Stand Limitations 15lb 2x10    Other Seated Lumbar Exercises seated lumbar flexion stretch 5s 15x with blue swiss [place]      Lumbar Exercises: Supine   Other Supine Lumbar Exercises bridge with band 2x10 BTB          Manual Therapy   Manual Therapy Soft tissue mobilization;Joint mobilization    Joint Mobilization Prone PA grade II pain modulation L3-S1    Soft tissue mobilization R hip rotators and gluteals; R L/S paraspinals and QL               PT Education - 11/17/20 1152     Education Details anatomy,  exercise progression, muscle firing, HEP,postural changes    Person(s) Educated Patient    Methods Explanation;Demonstration;Tactile cues;Verbal cues    Comprehension Verbalized understanding;Returned demonstration;Verbal cues required;Tactile cues required              PT Short Term Goals - 10/28/20 1413       PT SHORT TERM GOAL #1   Title Pt will become independent with HEP in order to demonstrate synthesis of PT education.    Time 2    Period Weeks    Status New      PT SHORT TERM GOAL #2   Title Pt will be able to demonstrate STS with full hip extension in order to demonstrate functional improvement in R hip strength and ROM for self-care and house hold duties.    Time 4    Period Weeks    Status New      PT SHORT TERM GOAL #3   Title Pt will have an at least 9 pt improvement in LEFS measure in order to demonstrate MCID improvement in daily function.    Baseline LEFS to be taken at next session    Time 4    Period Weeks    Status New               PT Long Term Goals - 10/28/20 1415       PT LONG TERM GOAL #1   Title Pt will become independent with final HEP in order to demonstrate synthesis of PT education.    Time 8    Period Weeks    Status New      PT LONG TERM GOAL #2   Title Pt will be able to perform 5XSTS in under 12  in order to demonstrate functional improvement above the cut off score for older adults.    Time 8    Period Weeks    Status New      PT LONG TERM GOAL #3   Title Pt will demonstrate/report ability to stand/sit/sleep without pain in order to demonstrate functional improvement and tolerance to static positioning.    Time 8    Period Weeks    Status New      PT LONG TERM GOAL #4   Title Pt will be able to demonstrate full floor to stand, kneeling to stand, and stand to kneeling transfer in  order to demonstrate functional improvement in LE function for house hold duties and gardening activities.    Time 8    Period Weeks    Status  New                                    Plan - 11/17/20 1153     Clinical Impression Statement Pt able to tolerate more progressive loading of exercise at today's session with minimal irritation of pain. Pt with increaesd R hip irritation with banded bridges suggesting intolerance to repeated combined hip extension and ABD. Pt was able to tolerate CKC hip ABD strengthening without irritation. Pt to transition to aquatic therapy in order to address strength due to land based deficits. Pt would benefit from continued skilled therapy in order to reach goals and maximize functional R hip strength and ROM for full return to PLOF    Personal Factors and Comorbidities Age;Comorbidity 3+;Fitness;Past/Current Experience;Time since onset of injury/illness/exacerbation    Comorbidities wrist fx, pleuritic chest pain, osteoporosis, migraine, hypothyroidism, glaucoma, fibromyalgia, R RTC repair, anterior lateral lumbar fusion L2-4 2021    Examination-Activity Limitations Bed Mobility;Sleep;Sit;Bend;Squat;Stairs;Carry;Stand;Transfers;Lift;Locomotion Level    Examination-Participation Restrictions Church;Cleaning;Community Activity;Driving;Laundry;Meal Prep;Shop    Stability/Clinical Decision Making Stable/Uncomplicated    Rehab Potential Good    PT Frequency 2x / week    PT Duration 8 weeks    PT Treatment/Interventions ADLs/Self Care Home Management;Cryotherapy;Electrical Stimulation;Moist Heat;Balance training;Therapeutic exercise;Therapeutic activities;Functional mobility training;Stair training;Gait training;Ultrasound;Neuromuscular re-education;Patient/family education;Manual techniques;Vasopneumatic Device;Taping;Energy conservation;Dry needling;Passive range of motion;Aquatic Therapy;Iontophoresis '4mg'$ /ml Dexamethasone;Traction;Spinal Manipulations;Joint Manipulations;Scar mobilization;Orthotic Fit/Training    PT Next Visit Plan SLR ABD, sidestepping, fwd flexion stretch    PT Home Exercise  Plan Access Code: [email or web address]    Consulted and Agree with Plan of Care Patient             Patient will benefit from skilled therapeutic intervention in order to improve the following deficits and impairments:  Abnormal gait, Hypomobility, Decreased activity tolerance, Decreased strength, Pain, Increased fascial restricitons, Decreased balance, Difficulty walking, Increased muscle spasms, Improper body mechanics, Decreased range of motion, Impaired flexibility, Postural dysfunction, Decreased mobility  Visit Diagnosis: Pain in right hip  Muscle weakness (generalized)  Difficulty walking     Problem List Patient Active Problem List   Diagnosis Date Noted   Fusion of lumbar spine 12/03/2019   COPD (chronic obstructive pulmonary disease) ([place]) 08/22/2019   Spinal stenosis of lumbar region with neurogenic claudication 11/22/2018   Coronary artery disease due to lipid rich plaque 10/03/2018   Closed fracture of distal end of radius 04/16/2017   Chronic back pain 03/15/2017   PCP NOTES >>>>>>>>>>>>>>>>>>>> 05/24/2015   Numbness and tingling of right arm 05/22/2015   Insomnia 09/01/2014   GERD (gastroesophageal reflux disease) 03/27/2013   Dysphagia, pharyngoesophageal phase 03/27/2013   Annual physical exam 06/12/2011   Sinusitis, chronic 02/02/2011   Osteoporosis 06/08/2010   Fibromyalgia, pain mngmt 01/29/2009   LIPOMA 09/30/2008   Migraine without aura 03/04/2007   Hypothyroidism 05/15/2006   COLONIC POLYPS, HX OF 05/15/2006    Daleen Bo PT, DPT 11/17/20 12:49 PM   [facility] [address] [place], [place], [address] Phone: [phone]   Fax:  [phone]  Name: [name] MRN: [id number] Date of Birth: Jan 07, 1946

## 2020-11-19 ENCOUNTER — Ambulatory Visit (HOSPITAL_BASED_OUTPATIENT_CLINIC_OR_DEPARTMENT_OTHER): Payer: Medicare HMO | Admitting: Physical Therapy

## 2020-11-19 ENCOUNTER — Other Ambulatory Visit: Payer: Self-pay

## 2020-11-19 ENCOUNTER — Encounter (HOSPITAL_BASED_OUTPATIENT_CLINIC_OR_DEPARTMENT_OTHER): Payer: Self-pay | Admitting: Physical Therapy

## 2020-11-19 DIAGNOSIS — R262 Difficulty in walking, not elsewhere classified: Secondary | ICD-10-CM

## 2020-11-19 DIAGNOSIS — M6281 Muscle weakness (generalized): Secondary | ICD-10-CM | POA: Diagnosis not present

## 2020-11-19 DIAGNOSIS — M25551 Pain in right hip: Secondary | ICD-10-CM | POA: Diagnosis not present

## 2020-11-19 NOTE — Therapy (Signed)
[facility] [address] [place], [place], [address] Phone: [phone]   Fax:  [phone]  Physical Therapy Treatment  Patient Details  Name: [name] MRN: [id number] Date of Birth: 07-Nov-1945 Referring Provider (PT): Colon Branch, MD   Encounter Date: 11/19/2020   PT End of Session - 11/19/20 1104     Visit Number 5    Number of Visits 17    Date for PT Re-Evaluation 01/26/21    Authorization Type [employer] Medicare    PT Start Time 1105    PT Stop Time [id number]    PT Time Calculation (min) 40 min    Activity Tolerance Patient tolerated treatment well;Patient limited by pain    Behavior During Therapy [facility] for tasks assessed/performed             Past Medical History:  Diagnosis Date   Arthritis    Cataract    both eyes rt. 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;  Service: Orthopedics;  Laterality: N/A;  4 hrs   COLONOSCOPY     COSMETIC SURGERY  04/12/2018   L upper eyelid ptosis repair, and trichophytic brow lift   ESOPHAGOGASTRODUODENOSCOPY (EGD) WITH PROPOFOL N/A 03/27/2013   Procedure: ESOPHAGOGASTRODUODENOSCOPY (EGD) WITH PROPOFOL;  Surgeon: Milus Banister, MD;  [facility];   Service: Endoscopy;  Laterality: N/A;  possible dil   EYE SURGERY     growth on cornea-bil.   plastic surgery face  2013   POLYPECTOMY     right rotator cuff repair  2010   TONSILLECTOMY     UPPER GASTROINTESTINAL ENDOSCOPY      There were no vitals filed for this visit.   Subjective Assessment - 11/19/20 1102     Subjective Pt states the pain is about a 5/10. She states she might have "overdid yesterday." She did a lot of shopping and walking. She also did more stairs.    Pertinent History wrist fx, pleuritic chest pain, osteoporosis, migraine, hypothyroidism, glaucoma, fibromyalgia, R RTC repair, anterior lateral lumbar fusion L2-12 Nov 2019    Patient Stated Goals Pt states she would be able to "funciton" and walk without "looking like an old lady."    Currently in Pain? Yes    Pain Score 5     Pain Location Back    Pain Orientation Right    Pain Descriptors / Indicators Aching                            Pt seen for aquatic therapy today.  Treatment took place in water  3.25-4 ft in depth at the [facility] pool. Temp of water was 94.  Pt entered/exited the pool via stairs (alternating) independently with bilat rail.  Warm up: Introduction to water and aquatic therapy properties, sidestepping, retro walking 4x each, QL stretch at edge of pool 30x 3s; standing flexion stretch with pool noodle 30s 3x  Exercises: standing hip ABD 2x10 ; noodle rotation in half squat 2x10, board press and row in half squat 2x20, standing quad noodle stretch at edge of pool 30s 3x each, hip flexor lunge stretch 30s 3x, step up 20x2    Pt requires buoyancy for support and to offload joints with strengthening exercises. Viscosity of the water is needed for resistance of strengthening; water current perturbations provides challenge to standing balance unsupported, requiring increased core activation.           PT Education - 11/19/20 1214     Education Details  anatomy, exercise progression, muscle firing, HEP,postural changes, DOMS, aquatic therapy recovery    Person(s) Educated Patient    Methods Explanation;Verbal cues;Demonstration;Tactile cues    Comprehension Returned demonstration;Verbal cues required;Verbalized understanding              PT Short Term Goals - 10/28/20 1413       PT SHORT TERM GOAL #1   Title Pt will become independent with HEP in order to demonstrate synthesis of PT education.    Time 2    Period Weeks    Status New      PT SHORT TERM GOAL #2   Title Pt will be able to demonstrate STS with full hip extension in order to demonstrate functional improvement in R hip strength and ROM for self-care and house hold duties.    Time 4    Period Weeks    Status New      PT SHORT TERM GOAL #3   Title Pt will have an at least 9 pt improvement in LEFS measure in order to demonstrate MCID improvement in daily function.    Baseline LEFS to be taken at next session    Time 4    Period Weeks    Status New               PT Long Term Goals - 10/28/20 1415       PT LONG TERM GOAL #1   Title Pt will become independent with final HEP in order to demonstrate synthesis of PT education.    Time 8    Period Weeks    Status New      PT LONG TERM GOAL #2   Title Pt will be able to perform 5XSTS in under 12  in order to demonstrate functional improvement above the cut off score for older adults.    Time 8    Period Weeks    Status New      PT LONG TERM GOAL #3   Title Pt will demonstrate/report ability to stand/sit/sleep without pain in order to demonstrate functional improvement and tolerance to static positioning.    Time 8    Period Weeks    Status New      PT LONG TERM GOAL #4   Title Pt will be able to demonstrate full floor to stand, kneeling to stand, and stand to kneeling transfer in order to demonstrate functional improvement in LE function for house hold duties and gardening activities.    Time 8     Period Weeks    Status New  PT LONG TERM GOAL #5   Time --    Period --    Status --                   Plan - 11/19/20 1215     Clinical Impression Statement Pt with good tolerance to aquatic therapy at today's session. After introduction and edu to water properties, pt was able to tolerate more standing and CKC exercise than on land. Pt had improved SLS time as well as tolerance to step up activity without increased pain. Pt did have mild difficulty with trunk stability due to water turbulence. Of note, pt had significant anterior hip and thigh tension that is likely contributing to foward flexed position. Likely need to revisit quad and hip flexor stretching at next aquatic session. Pt would benefit from continued skilled therapy in order to reach goals and maximize functional R hip strength and ROM for full return to PLOF    Personal Factors and Comorbidities Age;Comorbidity 3+;Fitness;Past/Current Experience;Time since onset of injury/illness/exacerbation    Comorbidities wrist fx, pleuritic chest pain, osteoporosis, migraine, hypothyroidism, glaucoma, fibromyalgia, R RTC repair, anterior lateral lumbar fusion L2-4 2021    Examination-Activity Limitations Bed Mobility;Sleep;Sit;Bend;Squat;Stairs;Carry;Stand;Transfers;Lift;Locomotion Level    Examination-Participation Restrictions Church;Cleaning;Community Activity;Driving;Laundry;Meal Prep;Shop    Stability/Clinical Decision Making Stable/Uncomplicated    Rehab Potential Good    PT Frequency 2x / week    PT Duration 8 weeks    PT Treatment/Interventions ADLs/Self Care Home Management;Cryotherapy;Electrical Stimulation;Moist Heat;Balance training;Therapeutic exercise;Therapeutic activities;Functional mobility training;Stair training;Gait training;Ultrasound;Neuromuscular re-education;Patient/family education;Manual techniques;Vasopneumatic Device;Taping;Energy conservation;Dry needling;Passive range of motion;Aquatic  Therapy;Iontophoresis '4mg'$ /ml Dexamethasone;Traction;Spinal Manipulations;Joint Manipulations;Scar mobilization;Orthotic Fit/Training    PT Next Visit Plan SLR ABD, sidestepping, fwd flexion stretch    PT Home Exercise Plan Access Code: [email or web address]    Consulted and Agree with Plan of Care Patient             Patient will benefit from skilled therapeutic intervention in order to improve the following deficits and impairments:  Abnormal gait, Hypomobility, Decreased activity tolerance, Decreased strength, Pain, Increased fascial restricitons, Decreased balance, Difficulty walking, Increased muscle spasms, Improper body mechanics, Decreased range of motion, Impaired flexibility, Postural dysfunction, Decreased mobility  Visit Diagnosis: Pain in right hip  Muscle weakness (generalized)  Difficulty walking     Problem List Patient Active Problem List   Diagnosis Date Noted   Fusion of lumbar spine 12/03/2019   COPD (chronic obstructive pulmonary disease) ([place]) 08/22/2019   Spinal stenosis of lumbar region with neurogenic claudication 11/22/2018   Coronary artery disease due to lipid rich plaque 10/03/2018   Closed fracture of distal end of radius 04/16/2017   Chronic back pain 03/15/2017   PCP NOTES >>>>>>>>>>>>>>>>>>>> 05/24/2015   Numbness and tingling of right arm 05/22/2015   Insomnia 09/01/2014   GERD (gastroesophageal reflux disease) 03/27/2013   Dysphagia, pharyngoesophageal phase 03/27/2013   Annual physical exam 06/12/2011   Sinusitis, chronic 02/02/2011   Osteoporosis 06/08/2010   Fibromyalgia, pain mngmt 01/29/2009   LIPOMA 09/30/2008   Migraine without aura 03/04/2007   Hypothyroidism 05/15/2006   COLONIC POLYPS, HX OF 05/15/2006   Daleen Bo PT, DPT 11/19/20 12:21 PM  [facility] [address] [place], [place], [address] Phone: [phone]   Fax:  [phone]  Name: [name] MRN: [id number] Date of  Birth: [birth year]

## 2020-11-22 ENCOUNTER — Encounter: Payer: Self-pay | Admitting: Internal Medicine

## 2020-11-22 NOTE — Telephone Encounter (Signed)
Requesting: hydrocodone 7.5-'325mg'$  Contract: 01/29/2020 UDS: 01/29/2020 Last Visit: 10/18/2020  Next Visit: 03/24/2021 Last Refill: 10/22/2020 #120 and 0RF  Please Advise

## 2020-11-23 MED ORDER — HYDROCODONE-ACETAMINOPHEN 7.5-325 MG PO TABS: 1.0000 | ORAL_TABLET | Freq: Four times a day (QID) | ORAL | 0 refills | Status: DC | PRN

## 2020-11-24 ENCOUNTER — Ambulatory Visit (HOSPITAL_BASED_OUTPATIENT_CLINIC_OR_DEPARTMENT_OTHER): Payer: Medicare HMO | Admitting: Physical Therapy

## 2020-11-24 ENCOUNTER — Encounter (HOSPITAL_BASED_OUTPATIENT_CLINIC_OR_DEPARTMENT_OTHER): Payer: Self-pay | Admitting: Physical Therapy

## 2020-11-24 ENCOUNTER — Other Ambulatory Visit: Payer: Self-pay

## 2020-11-24 DIAGNOSIS — M25551 Pain in right hip: Secondary | ICD-10-CM

## 2020-11-24 DIAGNOSIS — M6281 Muscle weakness (generalized): Secondary | ICD-10-CM | POA: Diagnosis not present

## 2020-11-24 DIAGNOSIS — R262 Difficulty in walking, not elsewhere classified: Secondary | ICD-10-CM

## 2020-11-24 NOTE — Therapy (Signed)
[facility] [address] [place], [place], [address] Phone: [phone]   Fax:  [phone]  Physical Therapy Treatment  Patient Details  Name: [name] MRN: [id number] Date of Birth: [birth year] Referring Provider (PT): Colon Branch, MD   Encounter Date: 11/24/2020   PT End of Session - 11/24/20 1038     Visit Number 6    Number of Visits 17    Date for PT Re-Evaluation 01/26/21    Authorization Type [employer]    PT Start Time 1100    PT Stop Time 1140    PT Time Calculation (min) 40 min    Activity Tolerance Patient tolerated treatment well;Patient limited by pain    Behavior During Therapy [facility] for tasks assessed/performed             Past Medical History:  Diagnosis Date   Arthritis    Cataract    both eyes rt. 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   Fibromyalgia    see rheumatology   GERD (gastroesophageal reflux disease)    PAST HX    Hyperlipidemia    Hypothyroidism    Migraine    Ocular rosacea 03/2009   on doxy   Osteoporosis    osteopenia   Pleuritic chest pain    etiology unclear, has beeb eval by pulmonary and rheumatology (autoimmune dz?)   Wrist fracture 2018    Past Surgical History:  Procedure Laterality Date   ANTERIOR LAT LUMBAR FUSION N/A 12/03/2019   Procedure: ANTERIOR LATERAL LUMBAR FUSION L2-4;  Surgeon: Melina Schools, MD;  Location: [facility];  Service: Orthopedics;  Laterality: N/A;  4 hrs   COLONOSCOPY     COSMETIC SURGERY  04/12/2018   L upper eyelid ptosis repair, and trichophytic brow lift   ESOPHAGOGASTRODUODENOSCOPY (EGD) WITH PROPOFOL N/A 03/27/2013   Procedure: ESOPHAGOGASTRODUODENOSCOPY (EGD) WITH PROPOFOL;  Surgeon: Milus Banister, MD;  Location: [facility];   Service: Endoscopy;  Laterality: N/A;  possible dil   EYE SURGERY     growth on cornea-bil.   plastic surgery face  2013   POLYPECTOMY     right rotator cuff repair  2010   TONSILLECTOMY     UPPER GASTROINTESTINAL ENDOSCOPY      There were no vitals filed for this visit.   Subjective Assessment - 11/24/20 1038     Subjective Pt states the pain is better since last session. She had decreased pain following PT visit that lasted for 2-3 days. She was more tired after the visit but felt good.    Pertinent History wrist fx, pleuritic chest pain, osteoporosis, migraine, hypothyroidism, glaucoma, fibromyalgia, R RTC repair, anterior lateral lumbar fusion L2-12 Nov 2019    Patient Stated Goals Pt states she would be able to "funciton" and walk without "looking like an old lady."    Currently in Pain? Yes    Pain Score 3     Pain Location Back    Pain Orientation Right    Pain Descriptors / Indicators Aching;Sore                      Pt seen for aquatic therapy today.  Treatment took place in water 3.25-4 ft in  depth at the [facility] pool. Temp of water was 92.  Pt entered/exited the pool via stairs (alternating) independently with bilat rail.   Warm up: sidestepping, retro walking 4x each, QL stretch at edge of pool 30x 3s; standing flexion stretch with pool noodle 30s 3x   Exercises: standing hip ABD 2x10 ; noodle rotation in half squat 2x10, board press and row in half squat 2x20, standing quad and HS noodle stretch at edge of pool 30s 3x each, hip flexor lunge stretch 30s 3x, step up 20x2, Standing hip ABD 20x each       Pt requires buoyancy for support and to offload joints with strengthening exercises. Viscosity of the water is needed for resistance of strengthening; water current perturbations provides challenge to standing balance unsupported, requiring increased core activation.                     PT Short Term Goals - 10/28/20 1413        PT SHORT TERM GOAL #1   Title Pt will become independent with HEP in order to demonstrate synthesis of PT education.    Time 2    Period Weeks    Status New      PT SHORT TERM GOAL #2   Title Pt will be able to demonstrate STS with full hip extension in order to demonstrate functional improvement in R hip strength and ROM for self-care and house hold duties.    Time 4    Period Weeks    Status New      PT SHORT TERM GOAL #3   Title Pt will have an at least 9 pt improvement in LEFS measure in order to demonstrate MCID improvement in daily function.    Baseline LEFS to be taken at next session    Time 4    Period Weeks    Status New               PT Long Term Goals - 10/28/20 1415       PT LONG TERM GOAL #1   Title Pt will become independent with final HEP in order to demonstrate synthesis of PT education.    Time 8    Period Weeks    Status New      PT LONG TERM GOAL #2   Title Pt will be able to perform 5XSTS in under 12  in order to demonstrate functional improvement above the cut off score for older adults.    Time 8    Period Weeks    Status New      PT LONG TERM GOAL #3   Title Pt will demonstrate/report ability to stand/sit/sleep without pain in order to demonstrate functional improvement and tolerance to static positioning.    Time 8    Period Weeks    Status New      PT LONG TERM GOAL #4   Title Pt will be able to demonstrate full floor to stand, kneeling to stand, and stand to kneeling transfer in order to demonstrate functional improvement in LE function for house hold duties and gardening activities.    Time 8    Period Weeks    Status New      PT LONG TERM GOAL #5   Time --    Period --    Status --                   Plan - 11/24/20 1040  Clinical Impression Statement Pt with good continued tolerance to aquatic therapy. Pt able to continue with aquatic lumbopelvic stability, mobility, and strength exercise without increased in  pain. Pt reports decreased pain at end of session and with good response and relief from previous session. Pt likely able to progress stability exercise at next session. Pt would benefit from continued skilled therapy in order to reach goals and maximize functional R hip strength and ROM for full return to PLOF    Personal Factors and Comorbidities Age;Comorbidity 3+;Fitness;Past/Current Experience;Time since onset of injury/illness/exacerbation    Comorbidities wrist fx, pleuritic chest pain, osteoporosis, migraine, hypothyroidism, glaucoma, fibromyalgia, R RTC repair, anterior lateral lumbar fusion L2-4 2021    Examination-Activity Limitations Bed Mobility;Sleep;Sit;Bend;Squat;Stairs;Carry;Stand;Transfers;Lift;Locomotion Level    Examination-Participation Restrictions Church;Cleaning;Community Activity;Driving;Laundry;Meal Prep;Shop    Stability/Clinical Decision Making Stable/Uncomplicated    Rehab Potential Good    PT Frequency 2x / week    PT Duration 8 weeks    PT Treatment/Interventions ADLs/Self Care Home Management;Cryotherapy;Electrical Stimulation;Moist Heat;Balance training;Therapeutic exercise;Therapeutic activities;Functional mobility training;Stair training;Gait training;Ultrasound;Neuromuscular re-education;Patient/family education;Manual techniques;Vasopneumatic Device;Taping;Energy conservation;Dry needling;Passive range of motion;Aquatic Therapy;Iontophoresis '4mg'$ /ml Dexamethasone;Traction;Spinal Manipulations;Joint Manipulations;Scar mobilization;Orthotic Fit/Training    PT Home Exercise Plan Access Code: [email or web address] and Agree with Plan of Care Patient             Patient will benefit from skilled therapeutic intervention in order to improve the following deficits and impairments:  Abnormal gait, Hypomobility, Decreased activity tolerance, Decreased strength, Pain, Increased fascial restricitons, Decreased balance, Difficulty walking, Increased muscle spasms, Improper  body mechanics, Decreased range of motion, Impaired flexibility, Postural dysfunction, Decreased mobility  Visit Diagnosis: Pain in right hip  Muscle weakness (generalized)  Difficulty walking     Problem List Patient Active Problem List   Diagnosis Date Noted   Fusion of lumbar spine 12/03/2019   COPD (chronic obstructive pulmonary disease) ([place]) 08/22/2019   Spinal stenosis of lumbar region with neurogenic claudication 11/22/2018   Coronary artery disease due to lipid rich plaque 10/03/2018   Closed fracture of distal end of radius 04/16/2017   Chronic back pain 03/15/2017   PCP NOTES >>>>>>>>>>>>>>>>>>>> 05/24/2015   Numbness and tingling of right arm 05/22/2015   Insomnia 09/01/2014   GERD (gastroesophageal reflux disease) 03/27/2013   Dysphagia, pharyngoesophageal phase 03/27/2013   Annual physical exam 06/12/2011   Sinusitis, chronic 02/02/2011   Osteoporosis 06/08/2010   Fibromyalgia, pain mngmt 01/29/2009   LIPOMA 09/30/2008   Migraine without aura 03/04/2007   Hypothyroidism 05/15/2006   COLONIC POLYPS, HX OF 05/15/2006   Daleen Bo PT, DPT 11/24/20 11:49 AM   [facility] [place], [place], [address] Phone: [phone]   Fax:  [phone]  Name: ALEIYA KISTNER MRN: [id number] Date of Birth: 1945/06/28

## 2020-11-25 ENCOUNTER — Ambulatory Visit (HOSPITAL_BASED_OUTPATIENT_CLINIC_OR_DEPARTMENT_OTHER): Payer: Medicare HMO | Admitting: Physical Therapy

## 2020-11-26 ENCOUNTER — Ambulatory Visit (HOSPITAL_BASED_OUTPATIENT_CLINIC_OR_DEPARTMENT_OTHER): Payer: Medicare HMO | Admitting: Physical Therapy

## 2020-12-01 ENCOUNTER — Encounter (HOSPITAL_BASED_OUTPATIENT_CLINIC_OR_DEPARTMENT_OTHER): Payer: Medicare HMO | Admitting: Physical Therapy

## 2020-12-02 ENCOUNTER — Other Ambulatory Visit: Payer: Self-pay

## 2020-12-02 ENCOUNTER — Ambulatory Visit (HOSPITAL_BASED_OUTPATIENT_CLINIC_OR_DEPARTMENT_OTHER): Payer: Medicare HMO | Admitting: Physical Therapy

## 2020-12-02 ENCOUNTER — Encounter (HOSPITAL_BASED_OUTPATIENT_CLINIC_OR_DEPARTMENT_OTHER): Payer: Self-pay | Admitting: Physical Therapy

## 2020-12-02 DIAGNOSIS — R262 Difficulty in walking, not elsewhere classified: Secondary | ICD-10-CM

## 2020-12-02 DIAGNOSIS — M25551 Pain in right hip: Secondary | ICD-10-CM

## 2020-12-02 DIAGNOSIS — M6281 Muscle weakness (generalized): Secondary | ICD-10-CM

## 2020-12-02 NOTE — Therapy (Signed)
[facility] [address] [place], [place], [address] Phone: [phone]   Fax:  [phone]  Physical Therapy Treatment  Patient Details  Name: [name] MRN: [id number] Date of Birth: 10-Dec-1945 Referring Provider (PT): Colon Branch, MD   Encounter Date: 12/02/2020   PT End of Session - 12/02/20 0932     Visit Number 7    Number of Visits 17    Date for PT Re-Evaluation 01/26/21    Authorization Type [employer] Medicare    PT Start Time 0930    PT Stop Time 1010    PT Time Calculation (min) 40 min    Activity Tolerance Patient tolerated treatment well;Patient limited by pain    Behavior During Therapy [facility] for tasks assessed/performed             Past Medical History:  Diagnosis Date   Arthritis    Cataract    both eyes rt. 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   Fibromyalgia    see rheumatology   GERD (gastroesophageal reflux disease)    PAST HX    Hyperlipidemia    Hypothyroidism    Migraine    Ocular rosacea 03/2009   on doxy   Osteoporosis    osteopenia   Pleuritic chest pain    etiology unclear, has beeb eval by pulmonary and rheumatology (autoimmune dz?)   Wrist fracture 2018    Past Surgical History:  Procedure Laterality Date   ANTERIOR LAT LUMBAR FUSION N/A 12/03/2019   Procedure: ANTERIOR LATERAL LUMBAR FUSION L2-4;  Surgeon: Melina Schools, MD;  Location: [facility];  Service: Orthopedics;  Laterality: N/A;  4 hrs   COLONOSCOPY     COSMETIC SURGERY  04/12/2018   L upper eyelid ptosis repair, and trichophytic brow lift   ESOPHAGOGASTRODUODENOSCOPY (EGD) WITH PROPOFOL N/A 03/27/2013   Procedure: ESOPHAGOGASTRODUODENOSCOPY (EGD) WITH PROPOFOL;  Surgeon: Milus Banister, MD;  Location: [facility];   Service: Endoscopy;  Laterality: N/A;  possible dil   EYE SURGERY     growth on cornea-bil.   plastic surgery face  2013   POLYPECTOMY     right rotator cuff repair  2010   TONSILLECTOMY     UPPER GASTROINTESTINAL ENDOSCOPY      There were no vitals filed for this visit.   Subjective Assessment - 12/02/20 0929     Subjective Pt states she is much more painful at today's session. Pt states she thinks it was from the sitting in the pool jets for too long after last session. She has been in pain for 3-4 days.    Pertinent History wrist fx, pleuritic chest pain, osteoporosis, migraine, hypothyroidism, glaucoma, fibromyalgia, R RTC repair, anterior lateral lumbar fusion L2-12 Nov 2019    Patient Stated Goals Pt states she would be able to "funciton" and walk without "looking like an old lady."    Currently in Pain? Yes    Pain Score 7     Pain Location Back    Pain Orientation Lower;Right    Pain Descriptors / Indicators Aching;Sore                    Pt seen for aquatic therapy today.  Treatment took place in  water 3.25-4 ft in depth at the [facility] pool. Temp of water was 94.  Pt entered/exited the pool via stairs (alternating) independently with bilat rail.   Warm up: sidestepping, retro walking 4x each, QL stretch at edge of pool 30x 3s; standing flexion stretch with pool noodle 30s 3x   Exercises: seated fig 4 stretch/ SKTC 30s 3x, standing quad and HS noodle stretch at edge of pool 30s 3x each, hip flexor lunge stretch 30s 3x, standing lumbar extension 3s 10x  Manual: Bad Ragaaz ankle hold lumbar mobilization, medial lateral, full floatation 46mn       Pt requires buoyancy for support and to offload joints with strengthening exercises. Viscosity of the water is needed for resistance of strengthening; water current perturbations provides challenge to standing balance unsupported, requiring increased core activation.                          PT Education - 12/02/20 1124     Education Details anatomy, exercise progression/regression, HEP, aquatic therapy recovery    Person(s) Educated Patient    Methods Explanation;Demonstration;Verbal cues;Tactile cues    Comprehension Verbalized understanding;Returned demonstration              PT Short Term Goals - 10/28/20 1413       PT SHORT TERM GOAL #1   Title Pt will become independent with HEP in order to demonstrate synthesis of PT education.    Time 2    Period Weeks    Status New      PT SHORT TERM GOAL #2   Title Pt will be able to demonstrate STS with full hip extension in order to demonstrate functional improvement in R hip strength and ROM for self-care and house hold duties.    Time 4    Period Weeks    Status New      PT SHORT TERM GOAL #3   Title Pt will have an at least 9 pt improvement in LEFS measure in order to demonstrate MCID improvement in daily function.    Baseline LEFS to be taken at next session    Time 4    Period Weeks    Status New               PT Long Term Goals - 10/28/20 1415       PT LONG TERM GOAL #1   Title Pt will become independent with final HEP in order to demonstrate synthesis of PT education.    Time 8    Period Weeks    Status New      PT LONG TERM GOAL #2   Title Pt will be able to perform 5XSTS in under 12  in order to demonstrate functional improvement above the cut off score for older adults.    Time 8    Period Weeks    Status New      PT LONG TERM GOAL #3   Title Pt will demonstrate/report ability to stand/sit/sleep without pain in order to demonstrate functional improvement and tolerance to static positioning.    Time 8    Period Weeks    Status New      PT LONG TERM GOAL #4   Title Pt will be able to demonstrate full floor to stand, kneeling to stand, and stand to kneeling transfer in order to demonstrate functional improvement in LE function for house hold duties  and gardening activities.    Time  8    Period Weeks    Status New      PT LONG TERM GOAL #5   Time --    Period --    Status --                   Plan - 12/02/20 1018     Clinical Impression Statement Pt presents to today's session with signficantly increased pain since last session. Pt report and presentation appear consistent with advancing too quickly with trunk stabilization and CKC exercise at last session. Pt likely unable to tolerate that level of loading due to latent increase in pain. Pt responded very well to today's session that focused mostly on improving ROM and reducing pain. Pt went fomr 7/10 report of pain to 4/10 by end of session. Bad Ragaaz manual therapy lumbar moblization at end of session improved SB ROM and relaxation throughout hips and trunk. Try to reintroduce strengthening exercise as able at next session. Pt would benefit from continued skilled therapy in order to reach goals and maximize functional R hip strength and ROM for full return to PLOF    Personal Factors and Comorbidities Age;Comorbidity 3+;Fitness;Past/Current Experience;Time since onset of injury/illness/exacerbation    Comorbidities wrist fx, pleuritic chest pain, osteoporosis, migraine, hypothyroidism, glaucoma, fibromyalgia, R RTC repair, anterior lateral lumbar fusion L2-4 2021    Examination-Activity Limitations Bed Mobility;Sleep;Sit;Bend;Squat;Stairs;Carry;Stand;Transfers;Lift;Locomotion Level    Examination-Participation Restrictions Church;Cleaning;Community Activity;Driving;Laundry;Meal Prep;Shop    Stability/Clinical Decision Making Stable/Uncomplicated    Rehab Potential Good    PT Frequency 2x / week    PT Duration 8 weeks    PT Treatment/Interventions ADLs/Self Care Home Management;Cryotherapy;Electrical Stimulation;Moist Heat;Balance training;Therapeutic exercise;Therapeutic activities;Functional mobility training;Stair training;Gait training;Ultrasound;Neuromuscular  re-education;Patient/family education;Manual techniques;Vasopneumatic Device;Taping;Energy conservation;Dry needling;Passive range of motion;Aquatic Therapy;Iontophoresis '4mg'$ /ml Dexamethasone;Traction;Spinal Manipulations;Joint Manipulations;Scar mobilization;Orthotic Fit/Training    PT Home Exercise Plan Access Code: [email or web address] and Agree with Plan of Care Patient             Patient will benefit from skilled therapeutic intervention in order to improve the following deficits and impairments:  Abnormal gait, Hypomobility, Decreased activity tolerance, Decreased strength, Pain, Increased fascial restricitons, Decreased balance, Difficulty walking, Increased muscle spasms, Improper body mechanics, Decreased range of motion, Impaired flexibility, Postural dysfunction, Decreased mobility  Visit Diagnosis: Pain in right hip  Muscle weakness (generalized)  Difficulty walking     Problem List Patient Active Problem List   Diagnosis Date Noted   Fusion of lumbar spine 12/03/2019   COPD (chronic obstructive pulmonary disease) ([place]) 08/22/2019   Spinal stenosis of lumbar region with neurogenic claudication 11/22/2018   Coronary artery disease due to lipid rich plaque 10/03/2018   Closed fracture of distal end of radius 04/16/2017   Chronic back pain 03/15/2017   PCP NOTES >>>>>>>>>>>>>>>>>>>> 05/24/2015   Numbness and tingling of right arm 05/22/2015   Insomnia 09/01/2014   GERD (gastroesophageal reflux disease) 03/27/2013   Dysphagia, pharyngoesophageal phase 03/27/2013   Annual physical exam 06/12/2011   Sinusitis, chronic 02/02/2011   Osteoporosis 06/08/2010   Fibromyalgia, pain mngmt 01/29/2009   LIPOMA 09/30/2008   Migraine without aura 03/04/2007   Hypothyroidism 05/15/2006   COLONIC POLYPS, HX OF 05/15/2006   Daleen Bo PT, DPT 12/02/20 11:25 AM   [facility] [name] [place], [place], [address] Phone:  [phone]   Fax:  [phone]  Name: SUANN EGGLETON MRN: [id number] Date of Birth: [birth year]

## 2020-12-03 ENCOUNTER — Encounter (HOSPITAL_BASED_OUTPATIENT_CLINIC_OR_DEPARTMENT_OTHER): Payer: Medicare HMO | Admitting: Physical Therapy

## 2020-12-06 ENCOUNTER — Ambulatory Visit (HOSPITAL_BASED_OUTPATIENT_CLINIC_OR_DEPARTMENT_OTHER): Payer: Medicare HMO | Admitting: Physical Therapy

## 2020-12-06 ENCOUNTER — Other Ambulatory Visit: Payer: Self-pay

## 2020-12-06 ENCOUNTER — Encounter (HOSPITAL_BASED_OUTPATIENT_CLINIC_OR_DEPARTMENT_OTHER): Payer: Self-pay | Admitting: Physical Therapy

## 2020-12-06 DIAGNOSIS — M6281 Muscle weakness (generalized): Secondary | ICD-10-CM | POA: Diagnosis not present

## 2020-12-06 DIAGNOSIS — M25551 Pain in right hip: Secondary | ICD-10-CM | POA: Diagnosis not present

## 2020-12-06 DIAGNOSIS — R262 Difficulty in walking, not elsewhere classified: Secondary | ICD-10-CM

## 2020-12-06 NOTE — Therapy (Signed)
[facility] [address] [place], [place], [address] Phone: [phone]   Fax:  [phone]  Physical Therapy Treatment  Patient Details  Name: [name] MRN: [id number] Date of Birth: [birth year] Referring Provider (PT): Colon Branch, MD   Encounter Date: 12/06/2020   PT End of Session - 12/06/20 0936     Visit Number 8    Number of Visits 17    Date for PT Re-Evaluation 01/26/21    Authorization Type [employer]    PT Start Time 0930    PT Stop Time 1010    PT Time Calculation (min) 40 min    Activity Tolerance Patient tolerated treatment well;Patient limited by pain    Behavior During Therapy [facility] for tasks assessed/performed             Past Medical History:  Diagnosis Date   Arthritis    Cataract    both eyes rt. 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   Fibromyalgia    see rheumatology   GERD (gastroesophageal reflux disease)    PAST HX    Hyperlipidemia    Hypothyroidism    Migraine    Ocular rosacea 03/2009   on doxy   Osteoporosis    osteopenia   Pleuritic chest pain    etiology unclear, has beeb eval by pulmonary and rheumatology (autoimmune dz?)   Wrist fracture 2018    Past Surgical History:  Procedure Laterality Date   ANTERIOR LAT LUMBAR FUSION N/A 12/03/2019   Procedure: ANTERIOR LATERAL LUMBAR FUSION L2-4;  Surgeon: Melina Schools, MD;  Location: [facility];  Service: Orthopedics;  Laterality: N/A;  4 hrs   COLONOSCOPY     COSMETIC SURGERY  04/12/2018   L upper eyelid ptosis repair, and trichophytic brow lift   ESOPHAGOGASTRODUODENOSCOPY (EGD) WITH PROPOFOL N/A 03/27/2013   Procedure: ESOPHAGOGASTRODUODENOSCOPY (EGD) WITH PROPOFOL;  Surgeon: Milus Banister, MD;  Location: [facility];   Service: Endoscopy;  Laterality: N/A;  possible dil   EYE SURGERY     growth on cornea-bil.   plastic surgery face  2013   POLYPECTOMY     right rotator cuff repair  2010   TONSILLECTOMY     UPPER GASTROINTESTINAL ENDOSCOPY      There were no vitals filed for this visit.   Subjective Assessment - 12/06/20 0933     Subjective Pt states the pain is much better today. Pt staes the pain is down and felt better after the last treatment session. Standing long times on her feet makes her leg burn.    Pertinent History wrist fx, pleuritic chest pain, osteoporosis, migraine, hypothyroidism, glaucoma, fibromyalgia, R RTC repair, anterior lateral lumbar fusion L2-12 Nov 2019    Patient Stated Goals Pt states she would be able to "funciton" and walk without "looking like an old lady."    Currently in Pain? Yes    Pain Score 3     Pain Location Back    Pain Orientation Right;Lower    Pain Descriptors / Indicators Aching;Sore                 Pt seen for aquatic therapy today.  Treatment took place in water 3.25-4 ft in depth at the [facility]. Temp of water was 92.  Pt entered/exited the pool via stairs (step to) independently with bilat rail.   Warm up: sidestepping, retro walking 4x each, QL stretch at edge of pool 30x 3s;    Exercises: seated fig 4 stretch/ SKTC 30s 3x, standing quad edge of pool 30s 3x each,  hip flexor lunge stretch 30s 3x, standing lumbar extension 3s 10x, fwd step up 2x10   Manual: Bad Ragaz ankle hold lumbar mobilization, medial lateral, full floatation 55mn       Pt requires buoyancy for support and to offload joints with strengthening exercises. Viscosity of the water is needed for resistance of strengthening; water current perturbations provides challenge to standing balance unsupported, requiring increased core activation.      Pt session interrupted by fire drill.               PT Short Term Goals - 10/28/20 1413        PT SHORT TERM GOAL #1   Title Pt will become independent with HEP in order to demonstrate synthesis of PT education.    Time 2    Period Weeks    Status New      PT SHORT TERM GOAL #2   Title Pt will be able to demonstrate STS with full hip extension in order to demonstrate functional improvement in R hip strength and ROM for self-care and house hold duties.    Time 4    Period Weeks    Status New      PT SHORT TERM GOAL #3   Title Pt will have an at least 9 pt improvement in LEFS measure in order to demonstrate MCID improvement in daily function.    Baseline LEFS to be taken at next session    Time 4    Period Weeks    Status New               PT Long Term Goals - 10/28/20 1415       PT LONG TERM GOAL #1   Title Pt will become independent with final HEP in order to demonstrate synthesis of PT education.    Time 8    Period Weeks    Status New      PT LONG TERM GOAL #2   Title Pt will be able to perform 5XSTS in under 12  in order to demonstrate functional improvement above the cut off score for older adults.    Time 8    Period Weeks    Status New      PT LONG TERM GOAL #3   Title Pt will demonstrate/report ability to stand/sit/sleep without pain in order to demonstrate functional improvement and tolerance to static positioning.    Time 8    Period Weeks    Status New      PT LONG TERM GOAL #4   Title Pt will be able to demonstrate full floor to stand, kneeling to stand, and stand to kneeling transfer in order to demonstrate functional improvement in LE function for house hold duties and gardening activities.    Time 8    Period Weeks    Status New                                    Plan - 12/06/20 1011     Clinical Impression Statement Pt with improvement in pain and mobility  since last session. Pt with report of 0/10 pain follow aquatic mobilization technique. Pt able to tolerate gentle reintroduction to LE and lumbopelvic stability exercise.  Plan to resume strength exercise as tolerated at next session. Pt session interrupted by facility fire drill. Pt would benefit from continued skilled therapy in order to reach goals and maximize functional R hip strength and ROM for full return to PLOF    Personal Factors and Comorbidities Age;Comorbidity 3+;Fitness;Past/Current Experience;Time since onset of injury/illness/exacerbation    Comorbidities wrist fx, pleuritic chest pain, osteoporosis, migraine, hypothyroidism, glaucoma, fibromyalgia, R RTC repair, anterior lateral lumbar fusion L2-4 2021    Examination-Activity Limitations Bed Mobility;Sleep;Sit;Bend;Squat;Stairs;Carry;Stand;Transfers;Lift;Locomotion Level    Examination-Participation Restrictions Church;Cleaning;Community Activity;Driving;Laundry;Meal Prep;Shop    Stability/Clinical Decision Making Stable/Uncomplicated    Rehab Potential Good    PT Frequency 2x / week    PT Duration 8 weeks    PT Treatment/Interventions ADLs/Self Care Home Management;Cryotherapy;Electrical Stimulation;Moist Heat;Balance training;Therapeutic exercise;Therapeutic activities;Functional mobility training;Stair training;Gait training;Ultrasound;Neuromuscular re-education;Patient/family education;Manual techniques;Vasopneumatic Device;Taping;Energy conservation;Dry needling;Passive range of motion;Aquatic Therapy;Iontophoresis '4mg'$ /ml Dexamethasone;Traction;Spinal Manipulations;Joint Manipulations;Scar mobilization;Orthotic Fit/Training    PT Home Exercise Plan Access Code: [email or web address] and Agree with Plan of Care Patient             Patient will benefit from skilled therapeutic intervention in order to improve the following deficits and impairments:  Abnormal gait, Hypomobility, Decreased activity tolerance, Decreased strength, Pain, Increased fascial restricitons, Decreased balance, Difficulty walking, Increased muscle spasms, Improper body mechanics, Decreased range of motion, Impaired  flexibility, Postural dysfunction, Decreased mobility  Visit Diagnosis: Pain in right hip  Muscle weakness (generalized)  Difficulty walking     Problem List Patient Active Problem List   Diagnosis Date Noted   Fusion of lumbar spine 12/03/2019   COPD (chronic obstructive pulmonary disease) ([place]) 08/22/2019   Spinal stenosis of lumbar region with neurogenic claudication 11/22/2018   Coronary artery disease due to lipid rich plaque 10/03/2018   Closed fracture of distal end of radius 04/16/2017   Chronic back pain 03/15/2017   PCP NOTES >>>>>>>>>>>>>>>>>>>> 05/24/2015   Numbness and tingling of right arm 05/22/2015   Insomnia 09/01/2014   GERD (gastroesophageal reflux disease) 03/27/2013   Dysphagia, pharyngoesophageal phase 03/27/2013   Annual physical exam 06/12/2011   Sinusitis, chronic 02/02/2011   Osteoporosis 06/08/2010   Fibromyalgia, pain mngmt 01/29/2009   LIPOMA 09/30/2008   Migraine without aura 03/04/2007   Hypothyroidism 05/15/2006   COLONIC POLYPS, HX OF 05/15/2006  Daleen Bo PT, DPT 12/06/20 1:05 PM   [facility] [address] [place], [place], [address] Phone: [phone]   Fax:  [phone]  Name: [name] MRN: [id number] Date of Birth: [birth year]

## 2020-12-16 ENCOUNTER — Ambulatory Visit (HOSPITAL_BASED_OUTPATIENT_CLINIC_OR_DEPARTMENT_OTHER): Payer: Medicare HMO | Attending: Orthopedic Surgery | Admitting: Physical Therapy

## 2020-12-16 ENCOUNTER — Encounter (HOSPITAL_BASED_OUTPATIENT_CLINIC_OR_DEPARTMENT_OTHER): Payer: Self-pay | Admitting: Physical Therapy

## 2020-12-16 ENCOUNTER — Other Ambulatory Visit: Payer: Self-pay

## 2020-12-16 DIAGNOSIS — R262 Difficulty in walking, not elsewhere classified: Secondary | ICD-10-CM | POA: Insufficient documentation

## 2020-12-16 DIAGNOSIS — M6281 Muscle weakness (generalized): Secondary | ICD-10-CM | POA: Insufficient documentation

## 2020-12-16 DIAGNOSIS — Z79899 Other long term (current) drug therapy: Secondary | ICD-10-CM | POA: Diagnosis not present

## 2020-12-16 DIAGNOSIS — M25551 Pain in right hip: Secondary | ICD-10-CM | POA: Insufficient documentation

## 2020-12-16 NOTE — Therapy (Signed)
[facility] [address] [place], [place], [address] Phone: [phone]   Fax:  [phone]  Physical Therapy Treatment  Patient Details  Name: [name] MRN: [id number] Date of Birth: 03-31-1946 Referring Provider (PT): Colon Branch, MD   Encounter Date: 12/16/2020   PT End of Session - 12/16/20 1105     Visit Number 9    Number of Visits 17    Date for PT Re-Evaluation 01/26/21    Authorization Type [employer] Medicare    PT Start Time 1100    PT Stop Time 1140    PT Time Calculation (min) 40 min    Activity Tolerance Patient tolerated treatment well;Patient limited by pain    Behavior During Therapy [facility] for tasks assessed/performed             Past Medical History:  Diagnosis Date   Arthritis    Cataract    both eyes rt. 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   Fibromyalgia    see rheumatology   GERD (gastroesophageal reflux disease)    PAST HX    Hyperlipidemia    Hypothyroidism    Migraine    Ocular rosacea 03/2009   on doxy   Osteoporosis    osteopenia   Pleuritic chest pain    etiology unclear, has beeb eval by pulmonary and rheumatology (autoimmune dz?)   Wrist fracture 2018    Past Surgical History:  Procedure Laterality Date   ANTERIOR LAT LUMBAR FUSION N/A 12/03/2019   Procedure: ANTERIOR LATERAL LUMBAR FUSION L2-4;  Surgeon: Melina Schools, MD;  Location: [facility];  Service: Orthopedics;  Laterality: N/A;  4 hrs   COLONOSCOPY     COSMETIC SURGERY  04/12/2018   L upper eyelid ptosis repair, and trichophytic brow lift   ESOPHAGOGASTRODUODENOSCOPY (EGD) WITH PROPOFOL N/A 03/27/2013   Procedure: ESOPHAGOGASTRODUODENOSCOPY (EGD) WITH PROPOFOL;  Surgeon: Milus Banister, MD;  Location: [facility];   Service: Endoscopy;  Laterality: N/A;  possible dil   EYE SURGERY     growth on cornea-bil.   plastic surgery face  2013   POLYPECTOMY     right rotator cuff repair  2010   TONSILLECTOMY     UPPER GASTROINTESTINAL ENDOSCOPY      There were no vitals filed for this visit.   Subjective Assessment - 12/16/20 1103     Subjective Pt states she feeling better today. Pt states the pain is better and has continue to come down. She states she still has some back and leg pain.    Pertinent History wrist fx, pleuritic chest pain, osteoporosis, migraine, hypothyroidism, glaucoma, fibromyalgia, R RTC repair, anterior lateral lumbar fusion L2-12 Nov 2019    Patient Stated Goals Pt states she would be able to "funciton" and walk without "looking like an old lady."    Currently in Pain? Yes    Pain Score 3     Pain Location Back    Pain Orientation Right;Lower    Pain Descriptors / Indicators Aching;Sore               Pt seen for aquatic therapy today.  Treatment took place in water 3.25-4 ft in depth at the [facility] pool. Temp of water was  94.  Pt entered/exited the pool via stairs (step to) independently with bilat rail.   Warm up: sidestepping, retro walking 4x each, QL stretch at edge of pool 30x 3s;    Exercises: seated fig 4 stretch/ SKTC 30s 3x, standing quad edge of pool 30s 3x each,  hip flexor lunge stretch 30s 3x, standing lumbar extension 3s 10x, fwd step up 2x10, STS from bench 2x10, standing hip circles 2x10 CW and CCW   Manual: Bad Ragaz ankle hold lumbar mobilization, medial lateral & push pull, full floatation 60mn     Pt requires buoyancy for support and to offload joints with strengthening exercises. Viscosity of the water is needed for resistance of strengthening; water current perturbations provides challenge to standing balance unsupported, requiring increased core activation.         PT Education - 12/16/20 1247     Education Details exercise  progression/regression, HEP, aquatic therapy exercise, POC    Person(s) Educated Patient    Methods Explanation;Tactile cues;Verbal cues;Demonstration    Comprehension Verbalized understanding;Returned demonstration              PT Short Term Goals - 10/28/20 1413       PT SHORT TERM GOAL #1   Title Pt will become independent with HEP in order to demonstrate synthesis of PT education.    Time 2    Period Weeks    Status New      PT SHORT TERM GOAL #2   Title Pt will be able to demonstrate STS with full hip extension in order to demonstrate functional improvement in R hip strength and ROM for self-care and house hold duties.    Time 4    Period Weeks    Status New      PT SHORT TERM GOAL #3   Title Pt will have an at least 9 pt improvement in LEFS measure in order to demonstrate MCID improvement in daily function.    Baseline LEFS to be taken at next session    Time 4    Period Weeks    Status New               PT Long Term Goals - 10/28/20 1415       PT LONG TERM GOAL #1   Title Pt will become independent with final HEP in order to demonstrate synthesis of PT education.    Time 8    Period Weeks    Status New      PT LONG TERM GOAL #2   Title Pt will be able to perform 5XSTS in under 12  in order to demonstrate functional improvement above the cut off score for older adults.    Time 8    Period Weeks    Status New      PT LONG TERM GOAL #3   Title Pt will demonstrate/report ability to stand/sit/sleep without pain in order to demonstrate functional improvement and tolerance to static positioning.    Time 8    Period Weeks    Status New      PT LONG TERM GOAL #4   Title Pt will be able to demonstrate full floor to stand, kneeling to stand, and stand to kneeling transfer in order to demonstrate functional improvement in LE function for house hold duties and gardening activities.    Time 8    Period Weeks    Status  Plan - 12/16/20 1251     Clinical Impression Statement Pt able to progress LE strength exercise at today's session without an increase in pain. Pt able to perform progression of SL stability exercise and CKC strengthening without increased pain. Pt had decrease of reported pain to 0/10 following session. Plan to continue to progress LE strength as able. Plan to assess functional and LE strength improvements at next visit.  Pt would benefit from continued skilled therapy in order to reach goals and maximize functional R hip strength and ROM for full return to PLOF    Personal Factors and Comorbidities Age;Comorbidity 3+;Fitness;Past/Current Experience;Time since onset of injury/illness/exacerbation    Comorbidities wrist fx, pleuritic chest pain, osteoporosis, migraine, hypothyroidism, glaucoma, fibromyalgia, R RTC repair, anterior lateral lumbar fusion L2-4 2021    Examination-Activity Limitations Bed Mobility;Sleep;Sit;Bend;Squat;Stairs;Carry;Stand;Transfers;Lift;Locomotion Level    Examination-Participation Restrictions Church;Cleaning;Community Activity;Driving;Laundry;Meal Prep;Shop    Stability/Clinical Decision Making Stable/Uncomplicated    Rehab Potential Good    PT Frequency 2x / week    PT Duration 8 weeks    PT Treatment/Interventions ADLs/Self Care Home Management;Cryotherapy;Electrical Stimulation;Moist Heat;Balance training;Therapeutic exercise;Therapeutic activities;Functional mobility training;Stair training;Gait training;Ultrasound;Neuromuscular re-education;Patient/family education;Manual techniques;Vasopneumatic Device;Taping;Energy conservation;Dry needling;Passive range of motion;Aquatic Therapy;Iontophoresis '4mg'$ /ml Dexamethasone;Traction;Spinal Manipulations;Joint Manipulations;Scar mobilization;Orthotic Fit/Training    PT Home Exercise Plan Access Code: [email or web address] and Agree with Plan of Care Patient             Patient will benefit from skilled  therapeutic intervention in order to improve the following deficits and impairments:  Abnormal gait, Hypomobility, Decreased activity tolerance, Decreased strength, Pain, Increased fascial restricitons, Decreased balance, Difficulty walking, Increased muscle spasms, Improper body mechanics, Decreased range of motion, Impaired flexibility, Postural dysfunction, Decreased mobility  Visit Diagnosis: Pain in right hip  Muscle weakness (generalized)  Difficulty walking     Problem List Patient Active Problem List   Diagnosis Date Noted   Fusion of lumbar spine 12/03/2019   COPD (chronic obstructive pulmonary disease) ([place]) 08/22/2019   Spinal stenosis of lumbar region with neurogenic claudication 11/22/2018   Coronary artery disease due to lipid rich plaque 10/03/2018   Closed fracture of distal end of radius 04/16/2017   Chronic back pain 03/15/2017   PCP NOTES >>>>>>>>>>>>>>>>>>>> 05/24/2015   Numbness and tingling of right arm 05/22/2015   Insomnia 09/01/2014   GERD (gastroesophageal reflux disease) 03/27/2013   Dysphagia, pharyngoesophageal phase 03/27/2013   Annual physical exam 06/12/2011   Sinusitis, chronic 02/02/2011   Osteoporosis 06/08/2010   Fibromyalgia, pain mngmt 01/29/2009   LIPOMA 09/30/2008   Migraine without aura 03/04/2007   Hypothyroidism 05/15/2006   COLONIC POLYPS, HX OF 05/15/2006    Daleen Bo, PT 12/16/2020, 12:56 PM  [facility] [address] [place], [place], [address] Phone: [phone]   Fax:  [phone]  Name: [name] MRN: [id number] Date of Birth: [birth year]

## 2020-12-17 LAB — BASIC METABOLIC PANEL
BUN/Creatinine Ratio: 13 (ref 12–28)
BUN: 10 mg/dL (ref 8–27)
CO2: 25 mmol/L (ref 20–29)
Calcium: 9 mg/dL (ref 8.7–10.3)
Chloride: 95 mmol/L — ABNORMAL LOW (ref 96–106)
Creatinine, Ser: 0.78 mg/dL (ref 0.57–1.00)
Glucose: 126 mg/dL — ABNORMAL HIGH (ref 65–99)
Potassium: 3.5 mmol/L (ref 3.5–5.2)
Sodium: 137 mmol/L (ref 134–144)
eGFR: 80 mL/min/{1.73_m2} (ref >59)

## 2020-12-23 ENCOUNTER — Telehealth: Payer: Self-pay | Admitting: Internal Medicine

## 2020-12-23 ENCOUNTER — Other Ambulatory Visit: Payer: Self-pay

## 2020-12-23 ENCOUNTER — Ambulatory Visit (HOSPITAL_BASED_OUTPATIENT_CLINIC_OR_DEPARTMENT_OTHER): Payer: Medicare HMO | Admitting: Physical Therapy

## 2020-12-23 ENCOUNTER — Encounter: Payer: Self-pay | Admitting: Internal Medicine

## 2020-12-23 ENCOUNTER — Encounter (HOSPITAL_BASED_OUTPATIENT_CLINIC_OR_DEPARTMENT_OTHER): Payer: Self-pay | Admitting: Physical Therapy

## 2020-12-23 DIAGNOSIS — M6281 Muscle weakness (generalized): Secondary | ICD-10-CM

## 2020-12-23 DIAGNOSIS — M25551 Pain in right hip: Secondary | ICD-10-CM

## 2020-12-23 DIAGNOSIS — R262 Difficulty in walking, not elsewhere classified: Secondary | ICD-10-CM | POA: Diagnosis not present

## 2020-12-23 NOTE — Telephone Encounter (Signed)
Last RX:09-23-20 #30 with 2 refills Last OV:10-18-20 Next OV:03-24-21 UDS:01-29-20 CSC:01-29-20

## 2020-12-23 NOTE — Telephone Encounter (Signed)
Requesting: alprazolam 0.'5mg'$  Contract: 01/29/2020 UDS: 01/29/2020 Last Visit: 10/18/2020 Next Visit: 03/24/2021  Last Refill: 09/23/2020 #30 and 2RF  Please Advise

## 2020-12-23 NOTE — Therapy (Signed)
[place], [place], [address] Phone: [phone]   Fax:  [phone]  Physical Therapy Progress Note  Progress Note Reporting Period 10/28/20 to 12/23/20   See note below for Objective Data and Assessment of Progress/Goals.      Patient Details  Name: [name] MRN: [id number] Date of Birth: 11-06-1945 Referring Provider (PT): Colon Branch, MD   Encounter Date: 12/23/2020   PT End of Session - 12/23/20 1103     Visit Number 10    Number of Visits 17    Date for PT Re-Evaluation 01/26/21    Authorization Type [employer] Medicare    PT Start Time 1055    PT Stop Time 1140    PT Time Calculation (min) 45 min    Activity Tolerance Patient tolerated treatment well;Patient limited by pain    Behavior During Therapy [facility] for tasks assessed/performed             Past Medical History:  Diagnosis Date   Arthritis    Cataract    both eyes rt. eye was removed   Chest pain    stress test @ [place] [facility] (-)   Colonic polyp    bx adenomatous polyps, next scope 2010   COPD (chronic obstructive pulmonary disease) (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   Fibromyalgia    see rheumatology   GERD (gastroesophageal reflux disease)    PAST HX    Hyperlipidemia    Hypothyroidism    Migraine    Ocular rosacea 03/2009   on doxy   Osteoporosis    osteopenia   Pleuritic chest pain    etiology unclear, has beeb eval by pulmonary and rheumatology (autoimmune dz?)   Wrist fracture 2018    Past Surgical History:  Procedure Laterality Date   ANTERIOR LAT LUMBAR FUSION N/A 12/03/2019   Procedure: ANTERIOR LATERAL LUMBAR FUSION L2-4;  Surgeon: Melina Schools, MD;  Location: [place];  Service: Orthopedics;  Laterality: N/A;  4 hrs   COLONOSCOPY     COSMETIC SURGERY  04/12/2018   L upper eyelid ptosis repair, and trichophytic brow lift   ESOPHAGOGASTRODUODENOSCOPY (EGD) WITH PROPOFOL  N/A 03/27/2013   Procedure: ESOPHAGOGASTRODUODENOSCOPY (EGD) WITH PROPOFOL;  Surgeon: Milus Banister, MD;  Location: [facility];  Service: Endoscopy;  Laterality: N/A;  possible dil   EYE SURGERY     growth on cornea-bil.   plastic surgery face  2013   POLYPECTOMY     right rotator cuff repair  2010   TONSILLECTOMY     UPPER GASTROINTESTINAL ENDOSCOPY      There were no vitals filed for this visit.   Subjective Assessment - 12/23/20 1101     Subjective Pt states she has had some back pain near where the hardware was placed. Sitting too long still aggravates the pain. She still feels like she is continuing to get better.    Pertinent History wrist fx, pleuritic chest pain, osteoporosis, migraine, hypothyroidism, glaucoma, fibromyalgia, R RTC repair, anterior lateral lumbar fusion L2-12 Nov 2019    Patient Stated Goals Pt states she would be able to "funciton" and walk without "looking like an old lady."    Currently in Pain? Yes    Pain Score 3     Pain Location Back    Pain Orientation Right;Lower    Pain Descriptors / Indicators Aching;Sore  [facility] PT Assessment - 12/23/20 0001       Assessment   Medical Diagnosis M54.9 (ICD-10-CM) - Upper back pain    Referring Provider (PT) Colon Branch, MD    Next MD Visit 6 weeks    Prior Therapy Mid/upper back pain      Precautions   Precautions None      Restrictions   Weight Bearing Restrictions No      Balance Screen   Has the patient fallen in the past 6 months No    Has the patient had a decrease in activity level because of a fear of falling?  No    Is the patient reluctant to leave their home because of a fear of falling?  No      Home Ecologist residence      Prior Function   Level of Independence Independent      Cognition   Overall Cognitive Status Within Functional Limits for tasks assessed      Functional Tests   Functional tests Sit to Stand;Single leg stance       Single Leg Stance   Comments <10s on each leg      Sit to Stand   Comments able to perform without UE support, improved hip extension ROM      AROM   Overall AROM Comments L/S moderately limited in all directions; flexion 60%, ext 50%, rotation 75%      PROM   Overall PROM Comments R hip flex 130 no pain in hip. Pain in back     Strength   Overall Strength Comments L hip 4+/5 throughout, R hip 4+/5 throughout      Flexibility   Soft Tissue Assessment /Muscle Length yes    Quadriceps limited knee flexion on R    ITB WFL                                                                   Transfers   Five time sit to stand comments  17.4s (assess on land)      Ambulation/Gait   Ambulation/Gait Yes    Ambulation Distance (Feet) 35 Feet    Gait Pattern Decreased hip/knee flexion - right;Decreased trunk rotation            Pt seen for aquatic therapy today.  Treatment took place in water 3.25-4 ft in depth at the [employer] pool. Temp of water was 94.  Pt entered/exited the pool via stairs (step to) independently with bilat rail.   Warm up: sidestepping, retro, and fwd walking 4x each, QL stretch at edge of pool 30x 3s;    Exercises: board press in half squat 20x, board press down in half squat 20x, standing quad edge of pool 30s 3x each,  hip flexor lunge stretch 30s 3x, standing lumbar extension 3s 10x, STS from bench 2x10, standing march 2x10, standing hip ABD 10x, lateral step up at step chest deep 2x10 each        Pt requires buoyancy for support and to offload joints with strengthening exercises. Viscosity of the water is needed for resistance of strengthening; water current perturbations provides challenge to standing balance unsupported, requiring increased core activation.  PT Short Term Goals - 12/23/20 1319       PT SHORT TERM GOAL #1   Title Pt will become independent with HEP in order to demonstrate synthesis  of PT education.    Time 2    Period Weeks    Status Achieved      PT SHORT TERM GOAL #2   Title Pt will be able to demonstrate STS with full hip extension in order to demonstrate functional improvement in R hip strength and ROM for self-care and house hold duties.    Time 4    Period Weeks    Status Partially Met      PT SHORT TERM GOAL #3   Title Pt will have an at least 9 pt improvement in LEFS measure in order to demonstrate MCID improvement in daily function.    Baseline LEFS to be taken at next session    Time 4    Period Weeks    Status Unable to assess               PT Long Term Goals - 12/23/20 1319       PT LONG TERM GOAL #1   Title Pt will become independent with final HEP in order to demonstrate synthesis of PT education.    Time 8    Period Weeks    Status On-going      PT LONG TERM GOAL #2   Title Pt will be able to perform 5XSTS in under 12  in order to demonstrate functional improvement above the cut off score for older adults.    Time 8    Period Weeks    Status Unable to assess      PT LONG TERM GOAL #3   Title Pt will demonstrate/report ability to stand/sit/sleep without pain in order to demonstrate functional improvement and tolerance to static positioning.    Time 8    Period Weeks    Status Partially Met      PT LONG TERM GOAL #4   Title Pt will be able to demonstrate full floor to stand, kneeling to stand, and stand to kneeling transfer in order to demonstrate functional improvement in LE function for house hold duties and gardening activities.    Time 8    Period Weeks    Status Partially Met   better with STM but still limited by tightness on the R LE                  Plan - 12/23/20 1307     Clinical Impression Statement Pt able to continue with progression of lumbopelvic strength exercise without pain. Pt's most irritating was going into spinal flexion and extension.. Pt able to perform progression of SL stability exercise  and CKC strengthening without increased pain. Pt has improved efficiency of transfers and improved her LE strength and tolerance to exercise. Pt to perform 5XSTS test at next session. Pt is progressing well with therapy and will transition to land therapy again soon due to improvements with pain free movement. Pt would benefit from continued skilled therapy in order to reach goals and maximize functional R hip strength and ROM for full return to PLOF    Personal Factors and Comorbidities Age;Comorbidity 3+;Fitness;Past/Current Experience;Time since onset of injury/illness/exacerbation    Comorbidities wrist fx, pleuritic chest pain, osteoporosis, migraine, hypothyroidism, glaucoma, fibromyalgia, R RTC repair, anterior lateral lumbar fusion L2-4 2021    Examination-Activity Limitations Bed Mobility;Sleep;Sit;Bend;Squat;Stairs;Carry;Stand;Transfers;Lift;Locomotion Level    Examination-Participation  Restrictions Church;Cleaning;Community Activity;Driving;Laundry;Meal Prep;Shop    Stability/Clinical Decision Making Stable/Uncomplicated    Rehab Potential Good    PT Frequency 2x / week    PT Duration 8 weeks    PT Treatment/Interventions ADLs/Self Care Home Management;Cryotherapy;Electrical Stimulation;Moist Heat;Balance training;Therapeutic exercise;Therapeutic activities;Functional mobility training;Stair training;Gait training;Ultrasound;Neuromuscular re-education;Patient/family education;Manual techniques;Vasopneumatic Device;Taping;Energy conservation;Dry needling;Passive range of motion;Aquatic Therapy;Iontophoresis 44m/ml Dexamethasone;Traction;Spinal Manipulations;Joint Manipulations;Scar mobilization;Orthotic Fit/Training    PT Home Exercise Plan Access Code: [email or web address]    [email or web address] Agree with Plan of Care Patient             Patient will benefit from skilled therapeutic intervention in order to improve the following deficits and impairments:  Abnormal gait, Hypomobility, Decreased activity  tolerance, Decreased strength, Pain, Increased fascial restricitons, Decreased balance, Difficulty walking, Increased muscle spasms, Improper body mechanics, Decreased range of motion, Impaired flexibility, Postural dysfunction, Decreased mobility  Visit Diagnosis: Pain in right hip  Muscle weakness (generalized)  Difficulty walking     Problem List Patient Active Problem List   Diagnosis Date Noted   Fusion of lumbar spine 12/03/2019   COPD (chronic obstructive pulmonary disease) ([place] 08/22/2019   Spinal stenosis of lumbar region with neurogenic claudication 11/22/2018   Coronary artery disease due to lipid rich plaque 10/03/2018   Closed fracture of distal end of radius 04/16/2017   Chronic back pain 03/15/2017   PCP NOTES >>>>>>>>>>>>>>>>>>>> 05/24/2015   Numbness and tingling of right arm 05/22/2015   Insomnia 09/01/2014   GERD (gastroesophageal reflux disease) 03/27/2013   Dysphagia, pharyngoesophageal phase 03/27/2013   Annual physical exam 06/12/2011   Sinusitis, chronic 02/02/2011   Osteoporosis 06/08/2010   Fibromyalgia, pain mngmt 01/29/2009   LIPOMA 09/30/2008   Migraine without aura 03/04/2007   Hypothyroidism 05/15/2006   COLONIC POLYPS, HX OF 05/15/2006    ADaleen Bo PT 12/23/2020, 1:19 PM  [facility] [address] [address]Phone: [phone]  Fax:  [phone] Name: BGABRIELLA WOODHEADMRN: [id number] of Birth: 101/31/1947

## 2020-12-23 NOTE — Telephone Encounter (Signed)
PDMP okay, prescription sent 

## 2020-12-24 ENCOUNTER — Other Ambulatory Visit: Payer: Self-pay

## 2020-12-24 MED ORDER — FLUTICASONE-SALMETEROL 100-50 MCG/ACT IN AEPB: 1.0000 | INHALATION_SPRAY | Freq: Every evening | RESPIRATORY_TRACT | 5 refills | Status: DC | PRN

## 2020-12-24 MED ORDER — HYDROCODONE-ACETAMINOPHEN 7.5-325 MG PO TABS: 1.0000 | ORAL_TABLET | Freq: Four times a day (QID) | ORAL | 0 refills | Status: DC | PRN

## 2020-12-24 NOTE — Telephone Encounter (Signed)
Patient is requesting a refill of the following medications: Requested Prescriptions   Pending Prescriptions Disp Refills   HYDROcodone-acetaminophen (NORCO) 7.5-325 MG tablet 120 tablet 0    Sig: Take 1 tablet by mouth every 6 (six) hours as needed for moderate pain.    Date of patient request: 12/23/20   Last office visit: 10/18/20 Date of last refill: 8/016/22 Last refill amount: 120 + 0 Follow up time period per chart: none  USD& Contract: 01/29/20 (last updated on 07/19/20)

## 2020-12-24 NOTE — Telephone Encounter (Signed)
Prescription sent

## 2020-12-29 ENCOUNTER — Other Ambulatory Visit: Payer: Self-pay

## 2020-12-29 ENCOUNTER — Encounter (HOSPITAL_BASED_OUTPATIENT_CLINIC_OR_DEPARTMENT_OTHER): Payer: Self-pay | Admitting: Physical Therapy

## 2020-12-29 ENCOUNTER — Ambulatory Visit (HOSPITAL_BASED_OUTPATIENT_CLINIC_OR_DEPARTMENT_OTHER): Payer: Medicare HMO | Admitting: Physical Therapy

## 2020-12-29 DIAGNOSIS — R262 Difficulty in walking, not elsewhere classified: Secondary | ICD-10-CM | POA: Diagnosis not present

## 2020-12-29 DIAGNOSIS — M6281 Muscle weakness (generalized): Secondary | ICD-10-CM

## 2020-12-29 DIAGNOSIS — M25551 Pain in right hip: Secondary | ICD-10-CM

## 2020-12-29 NOTE — Patient Instructions (Signed)
Access Code: [email or web address] URL: [email or web address] Date: 12/29/2020 Prepared by: Daleen Bo  Exercises Hooklying Single Knee to Chest Stretch - 2 x daily - 7 x weekly - 2 sets - 10 reps - 5 hold Seated Flexion Stretch with Swiss [place] - 2 x daily - 7 x weekly - 1 sets - 10 reps - 5 hold Supine Bridge with Resistance Band - 1 x daily - 3-4 x weekly - 2 sets - 10 reps Side Stepping with Resistance at Thighs - 1 x daily - 3-4 x weekly - 1 sets - 3 reps - 77ft hold Sit to Stand Without Arm Support - 1 x daily - 3-4 x weekly - 3 sets - 10 reps

## 2020-12-29 NOTE — Therapy (Signed)
[facility] [address] [place], [place], [address] Phone: [phone]   Fax:  [phone]  Physical Therapy Treatment  Patient Details  Name: [name] MRN: [id number] Date of Birth: 04-22-1945 Referring Provider (PT): Colon Branch, MD   Encounter Date: 12/29/2020   PT End of Session - 12/29/20 1012     Visit Number 11    Number of Visits 17    Date for PT Re-Evaluation 01/26/21    Authorization Type Aetna Medicare    PT Start Time 1012    PT Stop Time 1055    PT Time Calculation (min) 43 min    Activity Tolerance Patient tolerated treatment well;Patient limited by pain    Behavior During Therapy [facility] for tasks assessed/performed             Past Medical History:  Diagnosis Date   Arthritis    Cataract    both eyes rt. 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   Fibromyalgia    see rheumatology   GERD (gastroesophageal reflux disease)    PAST HX    Hyperlipidemia    Hypothyroidism    Migraine    Ocular rosacea 03/2009   on doxy   Osteoporosis    osteopenia   Pleuritic chest pain    etiology unclear, has beeb eval by pulmonary and rheumatology (autoimmune dz?)   Wrist fracture 2018    Past Surgical History:  Procedure Laterality Date   ANTERIOR LAT LUMBAR FUSION N/A 12/03/2019   Procedure: ANTERIOR LATERAL LUMBAR FUSION L2-4;  Surgeon: Melina Schools, MD;  Location: [place];  Service: Orthopedics;  Laterality: N/A;  4 hrs   COLONOSCOPY     COSMETIC SURGERY  04/12/2018   L upper eyelid ptosis repair, and trichophytic brow lift   ESOPHAGOGASTRODUODENOSCOPY (EGD) WITH PROPOFOL N/A 03/27/2013   Procedure: ESOPHAGOGASTRODUODENOSCOPY (EGD) WITH PROPOFOL;  Surgeon: Milus Banister, MD;  [facility];   Service: Endoscopy;  Laterality: N/A;  possible dil   EYE SURGERY     growth on cornea-bil.   plastic surgery face  2013   POLYPECTOMY     right rotator cuff repair  2010   TONSILLECTOMY     UPPER GASTROINTESTINAL ENDOSCOPY      There were no vitals filed for this visit.   Subjective Assessment - 12/29/20 1018     Subjective Pt states that she has had some back pain since last session. Pt states she willl have back pain at night after sitting in the recliner for about 2 hours and laying on her back makes it hurt. Pt states the mornings feel much better as compared to when we started.    Pertinent History wrist fx, pleuritic chest pain, osteoporosis, migraine, hypothyroidism, glaucoma, fibromyalgia, R RTC repair, anterior lateral lumbar fusion L2-12 Nov 2019    Patient Stated Goals Pt states she would be able to "funciton" and walk without "looking like an old lady."    Currently in Pain? Yes    Pain Score 2     Pain Location Back    Pain Orientation Right;Lower    Pain Descriptors / Indicators Aching;Sore                [facility]  PT Assessment - 12/29/20 0001       Assessment   Medical Diagnosis M54.9 (ICD-10-CM) - Upper back pain    Referring Provider (PT) Colon Branch, MD    Next MD Visit 6 weeks    Prior Therapy Mid/upper back pain      Precautions   Precautions None      Restrictions   Weight Bearing Restrictions No      Balance Screen   Has the patient fallen in the past 6 months No    Has the patient had a decrease in activity level because of a fear of falling?  No    Is the patient reluctant to leave their home because of a fear of falling?  No      Home Ecologist residence      Prior Function   Level of Independence Independent      Cognition   Overall Cognitive Status Within Functional Limits for tasks assessed      Observation/Other Assessments   Lower Extremity Functional Scale  47 / 80 = 58.8 %                                                                                                                                           Transfers   Five time sit to stand comments  14.3s                                            [facility] Adult PT Treatment/Exercise - 12/29/20 0001       Lumbar Exercises: Stretches   Single Knee to Chest Stretch Limitations --    Lower Trunk Rotation Limitations 15x 3s    Other Lumbar Stretch Exercise --      Lumbar Exercises: Aerobic   Nustep --      Lumbar Exercises: Standing   Other Standing Lumbar Exercises sidestepping RTB at knees 2x length of rail      Lumbar Exercises: Seated   Sit to Stand Limitations 10lb 10x    Other Seated Lumbar Exercises lifting mechanics 5x5 each   10lbs and 5lbs from floor height, box height, and table height     Lumbar Exercises: Supine   Other Supine Lumbar Exercises bridge with band 2x10 GTB                                      PT Education - 12/29/20 1330     Education Details anatomy, exercise progression, muscle firing, HEP,postural changes, DOMS, lifting mechanics, joint protection    Person(s) Educated Patient    Methods Demonstration;Tactile cues;Handout;Explanation;Verbal cues    Comprehension Verbalized understanding;Returned demonstration  PT Short Term Goals - 12/23/20 1319       PT SHORT TERM GOAL #1   Title Pt will become independent with HEP in order to demonstrate synthesis of PT education.    Time 2    Period Weeks    Status Achieved      PT SHORT TERM GOAL #2   Title Pt will be able to demonstrate STS with full hip extension in order to demonstrate functional improvement in R hip strength and ROM for self-care and house hold duties.    Time 4    Period Weeks    Status Partially Met      PT SHORT TERM GOAL #3   Title Pt will have an at least 9 pt improvement in LEFS measure in order to demonstrate MCID  improvement in daily function.    Baseline LEFS to be taken at next session    Time 4    Period Weeks    Status Unable to assess               PT Long Term Goals - 12/23/20 1319       PT LONG TERM GOAL #1   Title Pt will become independent with final HEP in order to demonstrate synthesis of PT education.    Time 8    Period Weeks    Status On-going      PT LONG TERM GOAL #2   Title Pt will be able to perform 5XSTS in under 12  in order to demonstrate functional improvement above the cut off score for older adults.    Time 8    Period Weeks    Status Unable to assess      PT LONG TERM GOAL #3   Title Pt will demonstrate/report ability to stand/sit/sleep without pain in order to demonstrate functional improvement and tolerance to static positioning.    Time 8    Period Weeks    Status Partially Met      PT LONG TERM GOAL #4   Title Pt will be able to demonstrate full floor to stand, kneeling to stand, and stand to kneeling transfer in order to demonstrate functional improvement in LE function for house hold duties and gardening activities.    Time 8    Period Weeks    Status Partially Met   better with STM but still limited by tightness on the R LE                  Plan - 12/29/20 1109     Clinical Impression Statement Pt demonstrates clinically signficant improvement with LE strength and self reported function. Pt able to progress HEP at today's session with increased intensity of loading. Pt had diffuclty with sequencing and motor control with lifting and required VC and TC for hip hinging and neutral spine position. Likely will need review at next session. pt to transition to more independent strengthening with HEP and reduce frequency of clinic visits. Pt would benefit from continued skilled therapy in order to reach goals and maximize functional R hip strength and ROM for full return to PLOF    Personal Factors and Comorbidities Age;Comorbidity  3+;Fitness;Past/Current Experience;Time since onset of injury/illness/exacerbation    Comorbidities wrist fx, pleuritic chest pain, osteoporosis, migraine, hypothyroidism, glaucoma, fibromyalgia, R RTC repair, anterior lateral lumbar fusion L2-4 2021    Examination-Activity Limitations Bed Mobility;Sleep;Sit;Bend;Squat;Stairs;Carry;Stand;Transfers;Lift;Locomotion Level    Examination-Participation Restrictions Church;Cleaning;Community Activity;Driving;Laundry;Meal Prep;Shop    Stability/Clinical Decision Making Stable/Uncomplicated  Rehab Potential Good    PT Frequency 2x / week    PT Duration 8 weeks    PT Treatment/Interventions ADLs/Self Care Home Management;Cryotherapy;Electrical Stimulation;Moist Heat;Balance training;Therapeutic exercise;Therapeutic activities;Functional mobility training;Stair training;Gait training;Ultrasound;Neuromuscular re-education;Patient/family education;Manual techniques;Vasopneumatic Device;Taping;Energy conservation;Dry needling;Passive range of motion;Aquatic Therapy;Iontophoresis 43m/ml Dexamethasone;Traction;Spinal Manipulations;Joint Manipulations;Scar mobilization;Orthotic Fit/Training    PT Home Exercise Plan Access Code: [email or web address]    [email or web address] Agree with Plan of Care Patient             Patient will benefit from skilled therapeutic intervention in order to improve the following deficits and impairments:  Abnormal gait, Hypomobility, Decreased activity tolerance, Decreased strength, Pain, Increased fascial restricitons, Decreased balance, Difficulty walking, Increased muscle spasms, Improper body mechanics, Decreased range of motion, Impaired flexibility, Postural dysfunction, Decreased mobility  Visit Diagnosis: Pain in right hip  Difficulty walking  Muscle weakness (generalized)     Problem List Patient Active Problem List   Diagnosis Date Noted   Fusion of lumbar spine 12/03/2019   [place]  08/[place]   Spinal stenosis of lumbar region with neurogenic claudication 11/22/2018   Coronary artery disease due to lipid rich plaque 10/03/2018   Closed fracture of distal end of radius 04/16/2017   Chronic back pain 03/15/2017   PCP NOTES >>>>>>>>>>>>>>>>>>>> 05/24/2015   Numbness and tingling of right arm 05/22/2015   Insomnia 09/01/2014   GERD (gastroesophageal reflux disease) 03/27/2013   Dysphagia, pharyngoesophageal phase 03/27/2013   Annual physical exam 06/12/2011   Sinusitis, chronic 02/02/2011   Osteoporosis 06/08/2010   Fibromyalgia, pain mngmt 01/29/2009   LIPOMA 09/30/2008   Migraine without aura 03/04/2007   Hypothyroidism 05/15/2006   COLONIC POLYPS, HX OF 05/15/2006    ADaleen Bo PT 12/29/2020, 1:31 PM  [facility] [address] [place] [address]Phone: [phone]  Fax:  [phone] Name: Michelle MANTERNACHMRN: [id number] of Birth: [birth year]

## 2020-12-30 ENCOUNTER — Other Ambulatory Visit: Payer: Self-pay | Admitting: Internal Medicine

## 2021-01-11 ENCOUNTER — Ambulatory Visit (HOSPITAL_BASED_OUTPATIENT_CLINIC_OR_DEPARTMENT_OTHER): Payer: Medicare HMO | Attending: Orthopedic Surgery | Admitting: Physical Therapy

## 2021-01-11 ENCOUNTER — Encounter (HOSPITAL_BASED_OUTPATIENT_CLINIC_OR_DEPARTMENT_OTHER): Payer: Self-pay | Admitting: Physical Therapy

## 2021-01-11 ENCOUNTER — Other Ambulatory Visit: Payer: Self-pay

## 2021-01-11 DIAGNOSIS — M25551 Pain in right hip: Secondary | ICD-10-CM | POA: Diagnosis not present

## 2021-01-11 DIAGNOSIS — M6281 Muscle weakness (generalized): Secondary | ICD-10-CM | POA: Diagnosis not present

## 2021-01-11 DIAGNOSIS — R262 Difficulty in walking, not elsewhere classified: Secondary | ICD-10-CM | POA: Diagnosis not present

## 2021-01-11 NOTE — Patient Instructions (Signed)
Access Code: [email or web address] URL: [email or web address] Date: 01/11/2021 Prepared by: Daleen Bo  Exercises Hooklying Single Knee to Chest Stretch - 2 x daily - 7 x weekly - 2 sets - 10 reps - 5 hold Seated Flexion Stretch with Swiss [place] - 2 x daily - 7 x weekly - 1 sets - 10 reps - 5 hold Supine Bridge with Resistance Band - 1 x daily - 3-4 x weekly - 2 sets - 10 reps Sit to Stand Without Arm Support - 1 x daily - 3-4 x weekly - 3 sets - 10 reps Side Stepping with Resistance at Thighs - 1 x daily - 3-4 x weekly - 1 sets - 2 reps - 77ft hold Standing Hip Abduction Adduction at [place] - 1 x daily - 7 x weekly - 2 sets - 10 reps Standing March at [place] 1 x daily - 7 x weekly - 2 sets - 10 reps Heel Toe Raises at [place] - 1 x daily - 7 x weekly - 2 sets - 10 reps Mini Squat with Counter Support - 1 x daily - 7 x weekly - 2 sets - 10 reps Standing Quadriceps Stretch - 1 x daily - 7 x weekly - 1 sets - 3 reps - 30 hold Standing Hamstring Stretch on Chair - 2 x daily - 7 x weekly - 1 sets - 3 reps - 30 hold  Patient Education Lifting Techniques

## 2021-01-11 NOTE — Therapy (Signed)
[facility] [address] [place], [place], [address] Phone: [phone]   Fax:  [phone]  Physical Therapy Discharge  Patient Details  Name: [name] MRN: [id number] Date of Birth: 09/05/1945 Referring Provider (PT): Colon Branch, MD   Encounter Date: 01/11/2021   PT End of Session - 01/11/21 1024     Visit Number 12    Number of Visits 17    Date for PT Re-Evaluation 01/26/21    Authorization Type Aetna Medicare    PT Start Time 1020    PT Stop Time 1100    PT Time Calculation (min) 40 min    Activity Tolerance Patient tolerated treatment well;Patient limited by pain    Behavior During Therapy [facility] for tasks assessed/performed             Past Medical History:  Diagnosis Date   Arthritis    Cataract    both eyes rt. 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   Fibromyalgia    see rheumatology   GERD (gastroesophageal reflux disease)    PAST HX    Hyperlipidemia    Hypothyroidism    Migraine    Ocular rosacea 03/2009   on doxy   Osteoporosis    osteopenia   Pleuritic chest pain    etiology unclear, has beeb eval by pulmonary and rheumatology (autoimmune dz?)   Wrist fracture 2018    Past Surgical History:  Procedure Laterality Date   ANTERIOR LAT LUMBAR FUSION N/A 12/03/2019   Procedure: ANTERIOR LATERAL LUMBAR FUSION L2-4;  Surgeon: Melina Schools, MD;  Location: [facility];  Service: Orthopedics;  Laterality: N/A;  4 hrs   COLONOSCOPY     COSMETIC SURGERY  04/12/2018   L upper eyelid ptosis repair, and trichophytic brow lift   ESOPHAGOGASTRODUODENOSCOPY (EGD) WITH PROPOFOL N/A 03/27/2013   Procedure: ESOPHAGOGASTRODUODENOSCOPY (EGD) WITH PROPOFOL;  Surgeon: Milus Banister, MD;  Location: [facility];   Service: Endoscopy;  Laterality: N/A;  possible dil   EYE SURGERY     growth on cornea-bil.   plastic surgery face  2013   POLYPECTOMY     right rotator cuff repair  2010   TONSILLECTOMY     UPPER GASTROINTESTINAL ENDOSCOPY      There were no vitals filed for this visit.   Subjective Assessment - 01/11/21 1020     Subjective Pt states she think some of the pain is related to previous history of mold exposure. She states it was related to weakness from that. Pt states she thinks it is a reaction to the surgery and another exacerbation of CFS. She states she is note sleeping due to the pain no matter the position. Pt states she has not made much progress in the time in between.    Pertinent History wrist fx, pleuritic chest pain, osteoporosis, migraine, hypothyroidism, glaucoma, fibromyalgia, R RTC repair, anterior lateral lumbar fusion L2-12 Nov 2019    Patient Stated Goals Pt states she would be able to "funciton" and walk without "looking like an old lady."    Currently in Pain? Yes    Pain Score 4     Pain Location Other (Comment)   joints in upper and lower  [facility] PT Assessment - 01/11/21 0001       Assessment   Medical Diagnosis M54.9 (ICD-10-CM) - Upper back pain    Referring Provider (PT) Colon Branch, MD    Next MD Visit 6 weeks    Prior Therapy Mid/upper back pain      Precautions   Precautions None      Restrictions   Weight Bearing Restrictions No      Balance Screen   Has the patient fallen in the past 6 months No    Has the patient had a decrease in activity level because of a fear of falling?  No    Is the patient reluctant to leave their home because of a fear of falling?  No      Home Ecologist residence      Prior Function   Level of Independence Independent      Cognition   Overall Cognitive Status Within Functional Limits for tasks assessed      Observation/Other Assessments   Lower Extremity  Functional Scale  33 / 80 = 41.3 %      AROM   Overall AROM Comments flexion 50%, ext 40% p!, rotation 75%, L SB 50% R SB 60%, L rot 50% p! R rot 50% p!      Strength   Overall Strength Comments 4/5 on R LE throughout with fatiguing weakness, L 4/5 hips and knee flex ext                    [facility] Adult PT Treatment/Exercise - 01/11/21 0001       HEP reviewed in detail with demonstration and feedback about position/form/technique/set up         Lumbar Exercises: Stretches   Lower Trunk Rotation Limitations 15x 3s      Lumbar Exercises: Standing   Other Standing Lumbar Exercises sidestepping RTB at knees 2x length of rail      Lumbar Exercises: Seated   Sit to Stand Limitations 10lb 10x    Other Seated Lumbar Exercises lifting mechanics 5x5   10lbs and 5lbs     Lumbar Exercises: Supine   Other Supine Lumbar Exercises bridge with band 2x10 GTB              Exercises  Hooklying Single Knee to Chest Stretch Seated Flexion Stretch with Swiss Klimaszewski  Supine Bridge with Resistance Band  Sit to Stand Without Arm Support  Side Stepping with Resistance at [employer] Standing Hip Abduction Adduction at [employer] [place] at [place] [facility] [place] with Counter Support  Standing Quadriceps Stretch  Standing Hamstring Stretch on Chair        PT Education - 01/11/21 1310     Education Details anatomy, exercise progression, HEP update and review, lifting mechanics, D/C plan    Person(s) Educated Patient    Methods Explanation;Demonstration;Tactile cues;Verbal cues;Handout    Comprehension Verbalized understanding;Returned demonstration              PT Short Term Goals - 01/11/21 1312       PT SHORT TERM GOAL #1   Title Pt will become independent with HEP in order to demonstrate synthesis of PT education.    Time 2    Period Weeks    Status Achieved      PT SHORT TERM GOAL #2   Title Pt will be able to  demonstrate STS with full hip extension in order to demonstrate functional improvement in R hip strength and ROM for self-care and house hold duties.    Time 4    Period Weeks    Status Achieved      PT SHORT TERM GOAL #3   Title Pt will have an at least 9 pt improvement in LEFS measure in order to demonstrate MCID improvement in daily function.    Baseline LEFS to be taken at next session    Time 4    Period Weeks    Status Not Met               PT Long Term Goals - 01/11/21 1320       PT LONG TERM GOAL #1   Title Pt will become independent with final HEP in order to demonstrate synthesis of PT education.    Time 8    Period Weeks    Status Achieved      PT LONG TERM GOAL #2   Title Pt will be able to perform 5XSTS in under 12  in order to demonstrate functional improvement above the cut off score for older adults.    Time 8    Period Weeks    Status Not Met      PT LONG TERM GOAL #3   Title Pt will demonstrate/report ability to stand/sit/sleep without pain in order to demonstrate functional improvement and tolerance to static positioning.    Time 8    Period Weeks    Status Partially Met      PT LONG TERM GOAL #4   Title Pt will be able to demonstrate full floor to stand, kneeling to stand, and stand to kneeling transfer in order to demonstrate functional improvement in LE function for house hold duties and gardening activities.    Time 8    Period Weeks    Status Not Met   better with STM but still limited by tightness on the R LE                  Plan - 01/11/21 1029     Clinical Impression Statement Given pt plateau with PT and recent increase polyarthralgia and fatigue, pt will likely need referral for potential further medical management of pain. Pt demonstrated mild improvement during course of PT but appears to have leveled off functionally and has had recent exerbation of pain globally that warrants further medical management, possibly  rheumatology. D/C current episode of care.    Personal Factors and Comorbidities Age;Comorbidity 3+;Fitness;Past/Current Experience;Time since onset of injury/illness/exacerbation    Comorbidities wrist fx, pleuritic chest pain, osteoporosis, migraine, hypothyroidism, glaucoma, fibromyalgia, R RTC repair, anterior lateral lumbar fusion L2-4 2021    Examination-Activity Limitations Bed Mobility;Sleep;Sit;Bend;Squat;Stairs;Carry;Stand;Transfers;Lift;Locomotion Level    Examination-Participation Restrictions Church;Cleaning;Community Activity;Driving;Laundry;Meal Prep;Shop    Stability/Clinical Decision Making Stable/Uncomplicated    Rehab Potential Good    PT Frequency 2x / week    PT Duration 8 weeks    PT Treatment/Interventions ADLs/Self Care Home Management;Cryotherapy;Electrical Stimulation;Moist Heat;Balance training;Therapeutic exercise;Therapeutic activities;Functional mobility training;Stair training;Gait training;Ultrasound;Neuromuscular re-education;Patient/family education;Manual techniques;Vasopneumatic Device;Taping;Energy conservation;Dry needling;Passive range of motion;Aquatic Therapy;Iontophoresis 8m/ml Dexamethasone;Traction;Spinal Manipulations;Joint Manipulations;Scar mobilization;Orthotic Fit/Training    PT Home Exercise Plan Access Code: [email or web address]    Consulted and Agree with Plan of Care Patient             Patient will benefit from skilled therapeutic intervention in order to improve the following deficits and impairments:  Abnormal gait, Hypomobility, Decreased  activity tolerance, Decreased strength, Pain, Increased fascial restricitons, Decreased balance, Difficulty walking, Increased muscle spasms, Improper body mechanics, Decreased range of motion, Impaired flexibility, Postural dysfunction, Decreased mobility  Visit Diagnosis: Pain in right hip  Difficulty walking  Muscle weakness (generalized)     Problem List Patient Active Problem List   Diagnosis Date  Noted   Fusion of lumbar spine 12/03/2019   COPD (chronic obstructive pulmonary disease) ([place]) 08/22/2019   Spinal stenosis of lumbar region with neurogenic claudication 11/22/2018   Coronary artery disease due to lipid rich plaque 10/03/2018   Closed fracture of distal end of radius 04/16/2017   Chronic back pain 03/15/2017   PCP NOTES >>>>>>>>>>>>>>>>>>>> 05/24/2015   Numbness and tingling of right arm 05/22/2015   Insomnia 09/01/2014   GERD (gastroesophageal reflux disease) 03/27/2013   Dysphagia, pharyngoesophageal phase 03/27/2013   Annual physical exam 06/12/2011   Sinusitis, chronic 02/02/2011   Osteoporosis 06/08/2010   Fibromyalgia, pain mngmt 01/29/2009   LIPOMA 09/30/2008   Migraine without aura 03/04/2007   Hypothyroidism 05/15/2006   COLONIC POLYPS, HX OF 05/15/2006    Daleen Bo, PT 01/11/2021, 1:28 PM  [facility] [address] [place], [place], [address] Phone: [phone]   Fax:  [phone]  Name: SHAKEIA KRUS MRN: [id number] Date of Birth: February 02, 1946  PHYSICAL THERAPY DISCHARGE SUMMARY  Visits from Start of Care: 12  Plan: Patient agrees to discharge.  Patient goals were not fully met. Patient is being discharged due to reaching functional plateau with current episode of care.

## 2021-01-17 ENCOUNTER — Telehealth: Payer: Self-pay | Admitting: Internal Medicine

## 2021-01-17 ENCOUNTER — Encounter: Payer: Self-pay | Admitting: Internal Medicine

## 2021-01-17 ENCOUNTER — Other Ambulatory Visit (HOSPITAL_BASED_OUTPATIENT_CLINIC_OR_DEPARTMENT_OTHER): Payer: Self-pay | Admitting: Obstetrics and Gynecology

## 2021-01-17 DIAGNOSIS — Z1231 Encounter for screening mammogram for malignant neoplasm of breast: Secondary | ICD-10-CM

## 2021-01-17 NOTE — Telephone Encounter (Signed)
Patient called to request the order for her next bone density test sent to [facility]. Last one was 02/24/2019  Call back # [phone]

## 2021-01-18 ENCOUNTER — Other Ambulatory Visit: Payer: Self-pay

## 2021-01-18 ENCOUNTER — Other Ambulatory Visit: Payer: Self-pay | Admitting: Internal Medicine

## 2021-01-18 DIAGNOSIS — G8929 Other chronic pain: Secondary | ICD-10-CM

## 2021-01-18 DIAGNOSIS — M549 Dorsalgia, unspecified: Secondary | ICD-10-CM

## 2021-01-18 DIAGNOSIS — M810 Age-related osteoporosis without current pathological fracture: Secondary | ICD-10-CM

## 2021-01-18 NOTE — Telephone Encounter (Signed)
Patient notified and will call and schedule test

## 2021-01-18 NOTE — Telephone Encounter (Signed)
OK, I ordered it. 

## 2021-01-19 ENCOUNTER — Ambulatory Visit: Payer: Medicare HMO | Attending: Internal Medicine

## 2021-01-19 DIAGNOSIS — Z23 Encounter for immunization: Secondary | ICD-10-CM

## 2021-01-19 NOTE — Progress Notes (Signed)
   Covid-19 Vaccination Clinic  Name:  [name]    MRN: [id number] DOB: 19-Aug-1945  01/19/2021  [name] was observed post Covid-19 immunization for 15 minutes without incident. She was provided with Vaccine Information Sheet and instruction to access the V-Safe system.   [name] was instructed to call 911 with any severe reactions post vaccine: Difficulty breathing  Swelling of face and throat  A fast heartbeat  A bad rash all over body  Dizziness and weakness

## 2021-01-23 ENCOUNTER — Telehealth: Payer: Self-pay | Admitting: Internal Medicine

## 2021-01-24 MED ORDER — HYDROCODONE-ACETAMINOPHEN 7.5-325 MG PO TABS: 1.0000 | ORAL_TABLET | Freq: Four times a day (QID) | ORAL | 0 refills | Status: DC | PRN

## 2021-01-24 NOTE — Telephone Encounter (Signed)
Requesting: hydrocodone 7.5-325mg  Contract: 01/29/2020 UDS: 01/29/2020 Last Visit: 10/18/2020 Next Visit: 03/24/2021 Last Refill: 12/24/2020 #120 and 0RF  Please Advise

## 2021-01-24 NOTE — Telephone Encounter (Signed)
PDMP okay, Rx sent 

## 2021-01-27 ENCOUNTER — Encounter: Payer: Self-pay | Admitting: Physical Medicine & Rehabilitation

## 2021-02-07 ENCOUNTER — Other Ambulatory Visit (HOSPITAL_BASED_OUTPATIENT_CLINIC_OR_DEPARTMENT_OTHER): Payer: Self-pay

## 2021-02-07 MED ORDER — PFIZER COVID-19 VAC BIVALENT 30 MCG/0.3ML IM SUSP
INTRAMUSCULAR | 0 refills | Status: DC
  Filled 2021-02-07: qty 0.3, 1d supply, fill #0

## 2021-02-17 NOTE — Telephone Encounter (Signed)
Prolia VOB initiated via [email or web address]  Last OV:  Next OV:  Last Prolia inj: 10/05/20 Next Prolia inj DUE: 04/07/21

## 2021-02-18 ENCOUNTER — Ambulatory Visit: Payer: Medicare HMO | Admitting: Cardiovascular Disease

## 2021-02-23 ENCOUNTER — Telehealth: Payer: Self-pay | Admitting: Internal Medicine

## 2021-02-24 ENCOUNTER — Other Ambulatory Visit (HOSPITAL_BASED_OUTPATIENT_CLINIC_OR_DEPARTMENT_OTHER): Payer: Self-pay | Admitting: Internal Medicine

## 2021-02-24 DIAGNOSIS — M810 Age-related osteoporosis without current pathological fracture: Secondary | ICD-10-CM

## 2021-02-24 MED ORDER — HYDROCODONE-ACETAMINOPHEN 7.5-325 MG PO TABS: 1.0000 | ORAL_TABLET | Freq: Four times a day (QID) | ORAL | 0 refills | Status: DC | PRN

## 2021-02-24 NOTE — Telephone Encounter (Signed)
Refill request for hydrocodone- referred to Dr. Letta Pate on 01/18/2021- do we need to defer?

## 2021-02-24 NOTE — Telephone Encounter (Signed)
PDMP okay, Rx sent x2 We will continue refilling her pain medication

## 2021-03-03 NOTE — Telephone Encounter (Signed)
Prior Authorization required for Prolia  PA PROCESS DETAILS: PA is required and is currently not on file. Call [phone] or complete the PA form available at [email or web address]

## 2021-03-17 ENCOUNTER — Encounter: Payer: Medicare HMO | Admitting: Physical Medicine & Rehabilitation

## 2021-03-22 DIAGNOSIS — H04123 Dry eye syndrome of bilateral lacrimal glands: Secondary | ICD-10-CM | POA: Diagnosis not present

## 2021-03-23 ENCOUNTER — Telehealth: Payer: Self-pay | Admitting: Internal Medicine

## 2021-03-23 MED ORDER — HYDROCODONE-ACETAMINOPHEN 7.5-325 MG PO TABS: 1.0000 | ORAL_TABLET | Freq: Four times a day (QID) | ORAL | 0 refills | Status: DC | PRN

## 2021-03-23 NOTE — Telephone Encounter (Signed)
Pdmp ok, rx sent  ? ?

## 2021-03-23 NOTE — Telephone Encounter (Signed)
Requesting: hydrocodone 7.5-325mg  Contract: 01/29/2020 UDS: 01/29/2020 Last Visit: 10/18/2020 Next Visit: 03/24/2021 Last Refill: 02/24/2021 #120 and 0RF  (Several days early)   Please Advise

## 2021-03-23 NOTE — Telephone Encounter (Signed)
Requesting: alprazolam 0.5mg   Contract: 01/29/2020 UDS: 01/29/2020 Last Visit: 10/18/2020 Next Visit: 03/24/2021 Last Refill: 12/23/2020 #30 and 2RF  Please Advise

## 2021-03-23 NOTE — Telephone Encounter (Signed)
Slightly early x3 days, will refill this time

## 2021-03-24 ENCOUNTER — Ambulatory Visit (INDEPENDENT_AMBULATORY_CARE_PROVIDER_SITE_OTHER): Payer: Medicare HMO | Admitting: Internal Medicine

## 2021-03-24 ENCOUNTER — Encounter: Payer: Self-pay | Admitting: Internal Medicine

## 2021-03-24 VITALS — BP 126/80 | HR 71 | Temp 98.6°F | Resp 18 | Ht 64.0 in | Wt 186.5 lb

## 2021-03-24 DIAGNOSIS — I2510 Atherosclerotic heart disease of native coronary artery without angina pectoris: Secondary | ICD-10-CM | POA: Diagnosis not present

## 2021-03-24 DIAGNOSIS — J069 Acute upper respiratory infection, unspecified: Secondary | ICD-10-CM | POA: Diagnosis not present

## 2021-03-24 DIAGNOSIS — M549 Dorsalgia, unspecified: Secondary | ICD-10-CM

## 2021-03-24 DIAGNOSIS — K219 Gastro-esophageal reflux disease without esophagitis: Secondary | ICD-10-CM | POA: Diagnosis not present

## 2021-03-24 DIAGNOSIS — E785 Hyperlipidemia, unspecified: Secondary | ICD-10-CM

## 2021-03-24 DIAGNOSIS — R739 Hyperglycemia, unspecified: Secondary | ICD-10-CM | POA: Diagnosis not present

## 2021-03-24 DIAGNOSIS — R1310 Dysphagia, unspecified: Secondary | ICD-10-CM | POA: Diagnosis not present

## 2021-03-24 DIAGNOSIS — E039 Hypothyroidism, unspecified: Secondary | ICD-10-CM | POA: Diagnosis not present

## 2021-03-24 DIAGNOSIS — Z79899 Other long term (current) drug therapy: Secondary | ICD-10-CM

## 2021-03-24 DIAGNOSIS — I2583 Coronary atherosclerosis due to lipid rich plaque: Secondary | ICD-10-CM | POA: Diagnosis not present

## 2021-03-24 DIAGNOSIS — G4700 Insomnia, unspecified: Secondary | ICD-10-CM

## 2021-03-24 DIAGNOSIS — G8929 Other chronic pain: Secondary | ICD-10-CM | POA: Diagnosis not present

## 2021-03-24 DIAGNOSIS — R1011 Right upper quadrant pain: Secondary | ICD-10-CM

## 2021-03-24 LAB — LIPID PANEL
Cholesterol: 189 mg/dL (ref 0–200)
HDL: 45.4 mg/dL (ref >39.00)
LDL Cholesterol: 107 mg/dL — ABNORMAL HIGH (ref 0–99)
NonHDL: 143.57
Total CHOL/HDL Ratio: 4
Triglycerides: 184 mg/dL — ABNORMAL HIGH (ref 0.0–149.0)
VLDL: 36.8 mg/dL (ref 0.0–40.0)

## 2021-03-24 LAB — CBC WITH DIFFERENTIAL/PLATELET
Basophils Absolute: 0 10*3/uL (ref 0.0–0.1)
Basophils Relative: 0.3 % (ref 0.0–3.0)
Eosinophils Absolute: 0.1 10*3/uL (ref 0.0–0.7)
Eosinophils Relative: 1.9 % (ref 0.0–5.0)
HCT: 40.7 % (ref 36.0–46.0)
Hemoglobin: 13.5 g/dL (ref 12.0–15.0)
Lymphocytes Relative: 23.2 % (ref 12.0–46.0)
Lymphs Abs: 1.7 10*3/uL (ref 0.7–4.0)
MCHC: 33.2 g/dL (ref 30.0–36.0)
MCV: 91.2 fl (ref 78.0–100.0)
Monocytes Absolute: 0.7 10*3/uL (ref 0.1–1.0)
Monocytes Relative: 9.7 % (ref 3.0–12.0)
Neutro Abs: 4.8 10*3/uL (ref 1.4–7.7)
Neutrophils Relative %: 64.9 % (ref 43.0–77.0)
Platelets: 266 10*3/uL (ref 150.0–400.0)
RBC: 4.47 Mil/uL (ref 3.87–5.11)
RDW: 13.4 % (ref 11.5–15.5)
WBC: 7.5 10*3/uL (ref 4.0–10.5)

## 2021-03-24 LAB — BASIC METABOLIC PANEL
BUN: 13 mg/dL (ref 6–23)
CO2: 32 mEq/L (ref 19–32)
Calcium: 9.2 mg/dL (ref 8.4–10.5)
Chloride: 100 mEq/L (ref 96–112)
Creatinine, Ser: 0.81 mg/dL (ref 0.40–1.20)
GFR: 71.19 mL/min (ref >60.00)
Glucose, Bld: 64 mg/dL — ABNORMAL LOW (ref 70–99)
Potassium: 3.6 mEq/L (ref 3.5–5.1)
Sodium: 139 mEq/L (ref 135–145)

## 2021-03-24 LAB — TSH: TSH: 2.73 u[IU]/mL (ref 0.35–5.50)

## 2021-03-24 LAB — HEMOGLOBIN A1C: Hgb A1c MFr Bld: 5.8 % (ref 4.6–6.5)

## 2021-03-24 NOTE — Patient Instructions (Addendum)
Take over-the-counter medications for your cold.  Come back if not completely well in 10 days  When you take tablets, be sure you have them with plenty of fluids  GO TO THE LAB : Get the blood work     [place], [place] back for   a checkup in 4 months   Stop by the first floor and schedule ultrasound

## 2021-03-24 NOTE — Progress Notes (Signed)
Subjective:    Patient ID: [name], female    DOB: 07-07-1945, 75 y.o.   MRN: [id number]  DOS:  03/24/2021 Type of visit - description: Follow-up  Has multiple symptoms and concerns  Developed a cold 10 days ago: Sore throat severe, nasal congestion, sneezing, postnasal dripping. Had fever the first 2 days. No chest pain, mild shortness of breath. Mild cough. No nausea or vomiting, no unusual myalgias. Overall she is somewhat better  Has a chronic (years) history of RUQ abdominal discomfort, in the last few months is more prevalent. Increases with food, does not change with deep breaths.  Increasing when she strains having a bowel movement. No postprandial nausea or diarrhea.  Difficulty swallowing sometimes, only with big tablets, no with solid foods or liquid.  Denies cough with swallowing, denies heartburn per se.  Self discontinue atorvastatin.   Review of Systems See above   Past Medical History:  Diagnosis Date   Arthritis    Cataract    both eyes rt. 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   on doxy   Osteoporosis    osteopenia   Pleuritic chest pain    etiology unclear, has beeb eval by pulmonary and rheumatology (autoimmune dz?)   Wrist fracture 2018    Past Surgical History:  Procedure Laterality Date   ANTERIOR LAT LUMBAR FUSION N/A 12/03/2019   Procedure: ANTERIOR LATERAL LUMBAR FUSION L2-4;  Surgeon: Melina Schools, MD;  Location: [facility];  Service: Orthopedics;  Laterality: N/A;  4 hrs   COLONOSCOPY     COSMETIC SURGERY  04/12/2018   L upper eyelid ptosis repair, and trichophytic brow lift    ESOPHAGOGASTRODUODENOSCOPY (EGD) WITH PROPOFOL N/A 03/27/2013   Procedure: ESOPHAGOGASTRODUODENOSCOPY (EGD) WITH PROPOFOL;  Surgeon: Milus Banister, MD;  Location: [facility];  Service: Endoscopy;  Laterality: N/A;  possible dil   EYE SURGERY     growth on cornea-bil.   plastic surgery face  2013   POLYPECTOMY     right rotator cuff repair  2010   TONSILLECTOMY     UPPER GASTROINTESTINAL ENDOSCOPY      Allergies as of 03/24/2021       Reactions   Alendronate Sodium Hives   Bactrim [sulfamethoxazole-trimethoprim]    Upset stomach   Contrast Media [iodinated Diagnostic Agents] Hives   Iodine Hives   Milnacipran    REACTION: increased bp, increased hr   Penicillins Hives        Medication List        Accurate as of March 24, 2021  8:14 PM. If you have any questions, ask your nurse or doctor.          STOP taking these medications    atorvastatin 10 MG tablet Commonly known as: LIPITOR Stopped by: Kathlene November, MD   [place] COVID-19 Vac Bivalent injection Generic drug: COVID-19 mRNA bivalent vaccine Therapist, music) Stopped by: Kathlene November, MD       TAKE these medications    ALPRAZolam 0.5 MG tablet Commonly known as: Duanne Moron TAKE  1 TABLET BY MOUTH AT BEDTIME AS NEEDED FOR ANXIETY   calcium carbonate 1250 (500 Ca) MG chewable tablet Commonly known as: OS-CAL Chew 1 tablet by mouth 2 (two) times daily.   denosumab 60 MG/ML Sosy injection Commonly known as: PROLIA Inject 60 mg into the skin every 6 (six) months.   Estradiol 10 MCG Tabs vaginal tablet Commonly known as: Yuvafem PLACE 1 TABLET VAGINALLY 2 TIMES A WEEK.   fluticasone-salmeterol 100-50 MCG/ACT Aepb Commonly known as: ADVAIR Inhale 1 puff into the lungs at bedtime as needed.   hydrochlorothiazide 12.5 MG capsule Commonly known as: MICROZIDE Take 1 capsule (12.5 mg total) by mouth daily.   HYDROcodone-acetaminophen 7.5-325 MG tablet Commonly known as: NORCO Take 1 tablet by mouth every 6 (six)  hours as needed for moderate pain.   hydroxypropyl methylcellulose / hypromellose 2.5 % ophthalmic solution Commonly known as: ISOPTO TEARS / GONIOVISC Place 1 drop into both eyes 3 (three) times daily as needed for dry eyes.   levothyroxine 88 MCG tablet Commonly known as: SYNTHROID TAKE 1 TABLET BY MOUTH DAILY BEFORE BREAKFAST.   multivitamin capsule Take 1 capsule by mouth daily.   Narcan 4 MG/0.1ML Liqd nasal spray kit Generic drug: naloxone Use as directed   Probiotic-10 Chew Chew 1 tablet by mouth daily.   Vitamin D3 50 MCG (2000 UT) Chew Chew 2,000 Units by mouth daily.           Objective:   Physical Exam BP 126/80 (BP Location: Left Arm, Patient Position: Sitting, Cuff Size: Small)    Pulse 71    Temp 98.6 F (37 C) (Oral)    Resp 18    Ht '5\' 4"'  (1.626 m)    Wt 186 lb 8 oz (84.6 kg)    LMP 04/10/1998 (Approximate)    SpO2 98%    BMI 32.01 kg/m  General:   Well developed, NAD, BMI noted.  HEENT:  Normocephalic . Face symmetric, atraumatic. Throat wnl Lungs:  CTA B Normal respiratory effort, no intercostal retractions, no accessory muscle use. Heart: RRR,  no murmur.  Abdomen:  Not distended, soft, slightly tender at the epigastric and RUQ area.  Not tender at the chest wall on the right side Skin: Not pale. Not jaundice Lower extremities: no pretibial edema bilaterally  Neurologic:  alert & oriented X3.  Speech normal, gait appropriate for age and unassisted Psych--  Cognition and judgment appear intact.  Cooperative with normal attention span and concentration.  Behavior appropriate. No anxious or depressed appearing.     Assessment    Assessment Hypothyroidism Hyperlipidemia: Started meds 09/2019 GERD Migraines insmonia-- xanax prn COPD: per chest CTs, former heavy smoker, quit 2005 [facility]- Pain mngmt  -- back pain, 2017 had a MRI, saw Dr Durward Fortes, Rx local injection, PT --h/o fibromyalgia --H/o Pleuritic chest pain, on-off, resolved   --spinal stenosis (MRI 05/2018)  Osteopenia:  Per ENDO --T score 2011   -2.6 (intolerant to fosamax, ok w/ Boniva x years) --T score 01-2013   -2.6 --T score 02-2015  -2.3, rx ca and cit D  Glaucoma Ocular Rosacea 2010 CV: LAD  artery disease  per CT scan of the chest, CP:  admitted, stress lest low risk 05-2015.  Control CV RF CAD, aortic sclerosis: Per CT  PLAN: Lower extremity edema: See last visit, saw cardiology, they added HCTZ, essentially no edema today. Pain management: Still on high doses of hydrocodone, had to cancel pain management visit this month but will see them 05/10/2020. Hypothyroidism:  Check TSH Hyperlipidemia: Intolerant to atorvastatin, has a stop and restart several times and always develop unusual aches.  Check FLP, consider Pravachol with results. URI: Sxs x 10 days ago, slightly better but not completely well, one of the main sxs was sore throat (typical for many COVID cases), she tested negative for COVID 5 days ago, physical exam is benign.  Recommend OTCs and observation. RUQ abdominal pain: Chronic, recently increased, no heartburn, symptoms increase with food.  Check abdominal ultrasound Hyperglycemia Per chart review, check A1c Dysphagia: 2 tablets only, see HPI, recommend take big tablets with water and observation.  Consider further eval.  Check CBC. Up-to-date on flu and COVID vaccines. RTC 4 months  This visit occurred during the SARS-CoV-2 public health emergency.  Safety protocols were in place, including screening questions prior to the visit, additional usage of staff PPE, and extensive cleaning of exam room while observing appropriate contact time as indicated for disinfecting solutions.

## 2021-03-24 NOTE — Assessment & Plan Note (Signed)
Lower extremity edema: See last visit, saw cardiology, they added HCTZ, essentially no edema today. Pain management: Still on high doses of hydrocodone, had to cancel pain management visit this month but will see them 05/10/2020. Hypothyroidism: Check TSH Hyperlipidemia: Intolerant to atorvastatin, has a stop and restart several times and always develop unusual aches.  Check FLP, consider Pravachol with results. URI: Sxs x 10 days ago, slightly better but not completely well, one of the main sxs was sore throat (typical for many COVID cases), she tested negative for COVID 5 days ago, physical exam is benign.  Recommend OTCs and observation. RUQ abdominal pain: Chronic, recently increased, no heartburn, symptoms increase with food.  Check abdominal ultrasound Hyperglycemia Per chart review, check A1c Dysphagia: 2 tablets only, see HPI, recommend take big tablets with water and observation.  Consider further eval.  Check CBC. Up-to-date on flu and COVID vaccines. RTC 4 months

## 2021-03-25 MED ORDER — PRAVASTATIN SODIUM 20 MG PO TABS: 20.0000 mg | ORAL_TABLET | Freq: Every day | ORAL | 1 refills | Status: DC

## 2021-03-25 NOTE — Addendum Note (Signed)
Addended byDamita Dunnings D on: 03/25/2021 02:18 PM   Modules accepted: Orders

## 2021-03-27 LAB — DRUG MONITORING PANEL 375977 , URINE
Alcohol Metabolites: NEGATIVE ng/mL (ref <500)
Alphahydroxyalprazolam: 75 ng/mL — ABNORMAL HIGH (ref <25)
Alphahydroxymidazolam: NEGATIVE ng/mL (ref <50)
Alphahydroxytriazolam: NEGATIVE ng/mL (ref <50)
Aminoclonazepam: NEGATIVE ng/mL (ref <25)
Amphetamines: NEGATIVE ng/mL (ref <500)
Barbiturates: NEGATIVE ng/mL (ref <300)
Benzodiazepines: POSITIVE ng/mL — AB (ref <100)
Cocaine Metabolite: NEGATIVE ng/mL (ref <150)
Codeine: NEGATIVE ng/mL (ref <50)
Desmethyltramadol: NEGATIVE ng/mL (ref <100)
Hydrocodone: 290 ng/mL — ABNORMAL HIGH (ref <50)
Hydromorphone: 198 ng/mL — ABNORMAL HIGH (ref <50)
Hydroxyethylflurazepam: NEGATIVE ng/mL (ref <50)
Lorazepam: NEGATIVE ng/mL (ref <50)
Marijuana Metabolite: NEGATIVE ng/mL (ref <20)
Morphine: NEGATIVE ng/mL (ref <50)
Nordiazepam: NEGATIVE ng/mL (ref <50)
Norhydrocodone: 567 ng/mL — ABNORMAL HIGH (ref <50)
Opiates: POSITIVE ng/mL — AB (ref <100)
Oxazepam: NEGATIVE ng/mL (ref <50)
Oxycodone: NEGATIVE ng/mL (ref <100)
Temazepam: NEGATIVE ng/mL (ref <50)
Tramadol: NEGATIVE ng/mL (ref <100)

## 2021-03-27 LAB — DM TEMPLATE

## 2021-03-29 ENCOUNTER — Encounter (HOSPITAL_BASED_OUTPATIENT_CLINIC_OR_DEPARTMENT_OTHER): Payer: Self-pay

## 2021-03-29 ENCOUNTER — Ambulatory Visit (HOSPITAL_BASED_OUTPATIENT_CLINIC_OR_DEPARTMENT_OTHER)
Admission: RE | Admit: 2021-03-29 | Discharge: 2021-03-29 | Disposition: A | Discharge status: Home / Self Care | Payer: Medicare HMO | Source: Ambulatory Visit | Attending: Internal Medicine | Admitting: Internal Medicine

## 2021-03-29 ENCOUNTER — Other Ambulatory Visit (HOSPITAL_BASED_OUTPATIENT_CLINIC_OR_DEPARTMENT_OTHER): Payer: Medicare HMO

## 2021-03-29 ENCOUNTER — Ambulatory Visit (HOSPITAL_BASED_OUTPATIENT_CLINIC_OR_DEPARTMENT_OTHER): Payer: Medicare HMO

## 2021-03-29 ENCOUNTER — Ambulatory Visit (HOSPITAL_BASED_OUTPATIENT_CLINIC_OR_DEPARTMENT_OTHER)
Admission: RE | Admit: 2021-03-29 | Discharge: 2021-03-29 | Disposition: A | Discharge status: Home / Self Care | Payer: Medicare HMO | Source: Ambulatory Visit | Attending: Obstetrics and Gynecology | Admitting: Obstetrics and Gynecology

## 2021-03-29 ENCOUNTER — Other Ambulatory Visit: Payer: Self-pay

## 2021-03-29 DIAGNOSIS — R1011 Right upper quadrant pain: Secondary | ICD-10-CM | POA: Insufficient documentation

## 2021-03-29 DIAGNOSIS — M810 Age-related osteoporosis without current pathological fracture: Secondary | ICD-10-CM

## 2021-03-29 DIAGNOSIS — Z1231 Encounter for screening mammogram for malignant neoplasm of breast: Secondary | ICD-10-CM | POA: Diagnosis not present

## 2021-03-29 DIAGNOSIS — Z780 Asymptomatic menopausal state: Secondary | ICD-10-CM | POA: Diagnosis not present

## 2021-03-30 ENCOUNTER — Other Ambulatory Visit: Payer: Self-pay | Admitting: Internal Medicine

## 2021-04-05 NOTE — Telephone Encounter (Signed)
Prior Auth approved PA# [id number]

## 2021-04-05 NOTE — Telephone Encounter (Signed)
Pt ready for scheduling on or after 04/07/21 IF scheduled AFTER 04/09/21, benefits will need to be re-run prior to Prolia inj.   Out-of-pocket cost due at time of visit: $295  Primary: [employer] Prolia co-insurance: 20% (approximately $270) Admin fee co-insurance: 20% (approximately $25)  Secondary: n/a Prolia co-insurance:  Admin fee co-insurance:   Deductible: does not apply  Prior Auth: APPROVED PA# [id number] Valid: 04/05/21-04/04/22  ** This summary of benefits is an estimation of the patient's out-of-pocket cost. Exact cost may vary based on individual plan coverage.

## 2021-04-06 NOTE — Telephone Encounter (Signed)
Left message to schedule Prolia injection

## 2021-04-08 ENCOUNTER — Ambulatory Visit: Payer: Medicare HMO

## 2021-04-08 ENCOUNTER — Other Ambulatory Visit: Payer: Self-pay

## 2021-04-08 DIAGNOSIS — M810 Age-related osteoporosis without current pathological fracture: Secondary | ICD-10-CM | POA: Diagnosis not present

## 2021-04-08 MED ORDER — DENOSUMAB 60 MG/ML ~~LOC~~ SOSY
60.0000 mg | PREFILLED_SYRINGE | Freq: Once | SUBCUTANEOUS | Status: AC
Start: 2021-04-08 — End: 2021-04-08
  Administered 2021-04-08: 11:00:00 60 mg via SUBCUTANEOUS

## 2021-04-08 NOTE — Progress Notes (Signed)
Prolia injection administered to pt's left arm. Pt tolerated well. °

## 2021-04-08 NOTE — Progress Notes (Signed)
Pt advised when getting her bone density scan they decided to scan her wrist because she had pins placed in her back (T2 T3 and T4) the tech advised scanning her wrist as an alternative.

## 2021-04-21 NOTE — Telephone Encounter (Signed)
Last Prolia inj 04/08/21 Nex Prolia inj due 10/08/21

## 2021-04-22 ENCOUNTER — Other Ambulatory Visit: Payer: Self-pay

## 2021-04-22 ENCOUNTER — Encounter: Payer: Self-pay | Admitting: Physical Medicine & Rehabilitation

## 2021-04-22 ENCOUNTER — Encounter: Payer: Medicare HMO | Attending: Physical Medicine & Rehabilitation | Admitting: Physical Medicine & Rehabilitation

## 2021-04-22 VITALS — BP 125/87 | HR 76 | Temp 98.0°F | Ht 64.0 in | Wt 186.0 lb

## 2021-04-22 DIAGNOSIS — R29898 Other symptoms and signs involving the musculoskeletal system: Secondary | ICD-10-CM | POA: Insufficient documentation

## 2021-04-22 NOTE — Progress Notes (Addendum)
Subjective:    Patient ID: [name], female    DOB: 10-Mar-1946, 76 y.o.   MRN: [id number]  HPI  [age]-year-old female referred by Dr. Larose Kells for the evaluation of back pain.  Patient indicates that her pain is moderate but constant dull tingling and aching.  Sleep is poor as result.  Pain does improve with rest ice medications and pacing.  She states that Dr. Larose Kells has been prescribing her pain medication and will continue to do so.  The patient is retired.  She is independent with all her self-care and mobility but does need some help with household duties and shopping. She does not do any regular exercise she is in bed about 9 hours a day she sits around 6 hours a day stands and walks and varying amount of times  Left hip flexor weakness and pain, last night had pain that radiated to the Right great toe Very painful last night and did not respond to hydrocodone    Hx of L2-3-4 instrumented fusion in 2021  3 rounds of PT, post op, aquatic therapy , developed some increasing RLE per Physical therapist   COMPARISON:  CT 03/12/2015.   FINDINGS: Bones: There is no evidence of acute fracture, dislocation or avascular necrosis. Old healed pubic rami fractures are present on the left. The visualized sacroiliac joints and symphysis pubis appear normal.   Articular cartilage and labrum   Articular cartilage: Mild degenerative changes are present at both hips, worse on the right. There is subchondral cyst formation anteriorly in the right acetabulum.   Labrum: There is no gross labral tear or paralabral abnormality.   Joint or bursal effusion   Joint effusion: No significant hip joint effusion.   Bursae: No focal periarticular fluid collection.   Muscles and tendons   Muscles and tendons: There is mild gluteus minimus tendinosis on the left with a small amount of surrounding fluid, best seen on the T2 weighted images. No full-thickness tendon tear or retraction.  The additional gluteus, hamstring and iliopsoas tendons are intact. No focal muscular atrophy. The soft tissue stranding previously seen superficial to the gluteus musculature has resolved.   Other findings   Miscellaneous: The visualized internal pelvic contents appear unremarkable.   IMPRESSION: 1. Mild gluteus minimus tendinosis on the left with adjacent bursal fluid. No tendon tear identified. 2. Mild degenerative changes of both hips, worse on the right. 3. No other significant findings.     Electronically Signed   By: Richardean Sale M.D.   On: 03/25/2016 13:58  CLINICAL DATA:  Low back and LEFT hip pain. Injured while dancing in December 2016.   EXAM: MRI LUMBAR SPINE WITHOUT CONTRAST   TECHNIQUE: Multiplanar, multisequence MR imaging of the lumbar spine was performed. No intravenous contrast was administered.   COMPARISON:  MRI lumbar spine 07/10/2007.   FINDINGS: Segmentation: Normal.   Alignment: Trace anterolisthesis L3-4, and trace retrolisthesis L4-5 are facet mediated.   Vertebrae: No worrisome osseous lesion.Old T12 compression fracture.   Conus medullaris: Normal in size, signal, and location.   Paraspinal tissues: No evidence for hydronephrosis or paravertebral mass. Renal cystic disease, incompletely evaluated.   Disc levels:   L1-L2: Shallow protrusion. Mild facet arthropathy. No definite impingement.   L2-L3: Broad-based protrusion extends to both neural foramina. Posterior element hypertrophy. Mild stenosis. Subarticular zone narrowing could affect either L3 nerve root. No definite L2 nerve root compression.   L3-L4: Central protrusion. Extraforaminal protrusion on the LEFT. Mild LEFT-sided foraminal narrowing.  Trace anterolisthesis. Moderate stenosis. Posterior element hypertrophy. LEFT L4 and L3 nerve root impingement are possible.   L4-L5: 3 mm retrolisthesis. Central and rightward protrusion. Posterior element hypertrophy.  Moderate stenosis. BILATERAL foraminal narrowing. RIGHT greater than LEFT L4 and L5 nerve root impingement are possible.   L5-S1: Central and leftward protrusion. Posterior element hypertrophy. BILATERAL foraminal narrowing. LEFT greater than RIGHT S1 and L5 nerve root impingement   Compared with priors, there is progression of degenerative disc disease at all levels. Compression fracture was present previously.   IMPRESSION: Multilevel spondylosis as described, with progression since 2009.   Potentially symptomatic LEFT-sided neural impingement at L2-3, L3-4, L4-5, and L5-S1. Moderate stenosis is worst at L3-4 and L4-5.   Chronic compression deformity T12, stable.     Electronically Signed   By: Staci Righter M.D.   On: 07/24/2015 13:15   CLINICAL DATA:  Left hip and leg pain after dancing 1 year ago. Evaluate for trochanteric bursitis and gluteal tendon tear.   EXAM: MR OF THE LEFT HIP WITHOUT CONTRAST   TECHNIQUE: Multiplanar, multisequence MR imaging was performed. No intravenous contrast was administered.   COMPARISON:  CT 03/12/2015.   FINDINGS: Bones: There is no evidence of acute fracture, dislocation or avascular necrosis. Old healed pubic rami fractures are present on the left. The visualized sacroiliac joints and symphysis pubis appear normal.   Articular cartilage and labrum   Articular cartilage: Mild degenerative changes are present at both hips, worse on the right. There is subchondral cyst formation anteriorly in the right acetabulum.   Labrum: There is no gross labral tear or paralabral abnormality.   Joint or bursal effusion   Joint effusion: No significant hip joint effusion.   Bursae: No focal periarticular fluid collection.   Muscles and tendons   Muscles and tendons: There is mild gluteus minimus tendinosis on the left with a small amount of surrounding fluid, best seen on the T2 weighted images. No full-thickness tendon tear or  retraction. The additional gluteus, hamstring and iliopsoas tendons are intact. No focal muscular atrophy. The soft tissue stranding previously seen superficial to the gluteus musculature has resolved.   Other findings   Miscellaneous: The visualized internal pelvic contents appear unremarkable.   IMPRESSION: 1. Mild gluteus minimus tendinosis on the left with adjacent bursal fluid. No tendon tear identified. 2. Mild degenerative changes of both hips, worse on the right. 3. No other significant findings.     Electronically Signed   By: Richardean Sale M.D.   On: 03/25/2016 13:58     MRI 09/18/2018 of the lumbar spine was done at [facility] ordered by Dr. Rolena Infante and demonstrated moderate to severe right subarticular stenosis progressive at L2-3 there is also evidence of spinal stenosis at L3-4 with left and moderate right foraminal stenosis potentially affecting left L3 nerve roots, at L4-5 some thickening of ligamentum flavum moderate left and mild to moderate right spinal canal stenosis, at L5-S1 left and disc bulging potentially causing stenosis of L5 nerve root bilaterally particular area and foraminal stenosis more so on the right Pain Inventory Average Pain 6 Pain Right Now 4 My pain is constant, dull, tingling, and aching  In the last 24 hours, has pain interfered with the following? General activity 6 Relation with others 4 Enjoyment of life 7 What TIME of day is your pain at its worst? daytime, evening, and night Sleep (in general) Poor  Pain is worse with: walking, bending, sitting, inactivity, standing, and some activites Pain improves with:  rest, heat/ice, pacing activities, and medication Relief from Meds: 7  walk without assistance walk with assistance ability to climb steps?  yes do you drive?  yes Do you have any goals in this area?  yes  retired I need assistance with the following:  household duties and shopping  bladder control problems bowel control  problems  Any changes since last visit?  no  Any changes since last visit?  no    Family History  Adopted: Yes  Problem Relation Age of Onset   Cancer Neg Hx    Depression Neg Hx    Diabetes Neg Hx    Stroke Neg Hx    Hypertension Neg Hx    Heart disease Neg Hx    Social History   Socioeconomic History   Marital status: Married    Spouse name: Not on file   Number of children: 0   Years of education: Not on file   Highest education level: Not on file  Occupational History   Occupation: retired, 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  Tobacco Use   Smoking status: Former    Packs/day: 1.00    Years: 38.50    Pack years: 38.50    Types: Cigarettes    Quit date: 01/13/2004    Years since quitting: 17.2   Smokeless tobacco: Never   Tobacco comments:    quit 2005  Vaping Use   Vaping Use: Never used  Substance and Sexual Activity   Alcohol use: No    Alcohol/week: 0.0 standard drinks    Comment: rare   Drug use: No   Sexual activity: Not Currently  Other Topics Concern   Not on file  Social History Narrative   No biological children      Social Determinants of Health   Financial Resource Strain: Low Risk    Difficulty of Paying Living Expenses: Not hard at all  Food Insecurity: No Food Insecurity   Worried About Charity fundraiser in the Last Year: Never true   [place] in the Last Year: Never true  Transportation Needs: Not on file  Physical Activity: Inactive   Days of Exercise per Week: 0 days   Minutes of Exercise per Session: 0 min  Stress: No Stress Concern Present   Feeling of Stress : Not at all  Social Connections: Moderately Isolated   Frequency of Communication with Friends and Family: More than three times a week   Frequency of Social Gatherings with Friends and Family: More than three times a week   Attends Religious Services: Never   Marine scientist or Organizations: No   Attends Archivist Meetings: Never   Marital  Status: Married   Past Surgical History:  Procedure Laterality Date   ANTERIOR LAT LUMBAR FUSION N/A 12/03/2019   Procedure: ANTERIOR LATERAL LUMBAR FUSION L2-4;  Surgeon: Melina Schools, MD;  Location: [facility];  Service: Orthopedics;  Laterality: N/A;  4 hrs   COLONOSCOPY     COSMETIC SURGERY  04/12/2018   L upper eyelid ptosis repair, and trichophytic brow lift   ESOPHAGOGASTRODUODENOSCOPY (EGD) WITH PROPOFOL N/A 03/27/2013   Procedure: ESOPHAGOGASTRODUODENOSCOPY (EGD) WITH PROPOFOL;  Surgeon: Milus Banister, MD;  Location: [facility];  Service: Endoscopy;  Laterality: N/A;  possible dil   EYE SURGERY     growth on cornea-bil.   plastic surgery face  2013   POLYPECTOMY     right rotator cuff repair  2010   TONSILLECTOMY  UPPER GASTROINTESTINAL ENDOSCOPY     Past Medical History:  Diagnosis Date   Arthritis    Cataract    both eyes rt. 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   Fibromyalgia    see rheumatology   GERD (gastroesophageal reflux disease)    PAST HX    Hyperlipidemia    Hypothyroidism    Migraine    Ocular rosacea 03/2009   on doxy   Osteoporosis    osteopenia   Pleuritic chest pain    etiology unclear, has beeb eval by pulmonary and rheumatology (autoimmune dz?)   Wrist fracture 2018   BP 125/87    Pulse 76    Temp 98 F (36.7 C)    Ht 5\' 4"  (1.626 m)    Wt 186 lb (84.4 kg)    LMP 04/10/1998 (Approximate)    SpO2 98%    BMI 31.93 kg/m   Opioid Risk Score:   Fall Risk Score:  `1  Depression screen PHQ 2/9  Depression screen [facility] 2/9 04/22/2021 03/24/2021 10/18/2020 09/29/2020 08/12/2020 07/20/2020 06/01/2020  Decreased Interest 0 0 0 0 0 0 0  Down, Depressed, Hopeless 0 0 0 0 0 0 0  PHQ - 2 Score 0 0 0 0 0 0 0  Altered sleeping 0 0 0 - - 2 -  Tired, decreased  energy 0 0 0 - - 2 -  Change in appetite 0 0 0 - - 2 -  Feeling bad or failure about yourself  0 0 0 - - 0 -  Trouble concentrating 0 0 0 - - 0 -  Moving slowly or fidgety/restless 0 0 0 - - 0 -  Suicidal thoughts 0 0 0 - - 0 -  PHQ-9 Score 0 0 0 - - 6 -  Difficult doing work/chores Not difficult at all - - - - Not difficult at all -  Some recent data might be hidden     Review of Systems  Constitutional: Negative.   HENT: Negative.    Eyes: Negative.   Respiratory: Negative.    Cardiovascular: Negative.   Gastrointestinal: Negative.   Endocrine: Negative.   Genitourinary: Negative.   Musculoskeletal:  Positive for back pain.  Skin: Negative.   Allergic/Immunologic: Negative.   Neurological: Negative.   Hematological: Negative.   Psychiatric/Behavioral:  Positive for sleep disturbance.       Objective:   Physical Exam Vitals and nursing note reviewed.  HENT:     Head: Normocephalic and atraumatic.  Eyes:     Extraocular Movements: Extraocular movements intact.     Conjunctiva/sclera: Conjunctivae normal.     Pupils: Pupils are equal, round, and reactive to light.  Neurological:     Mental Status: She is alert and oriented to person, place, and time.     Comments: Motor strength is 5/5 bilateral deltoid, bicep, tricep, grip 3/5 in the right hip flexor 5 in the left hip flexor 5 hip bilateral knee extensors as well as ankle dorsiflexors. Negative straight leg raises Ambulates without assistive device no evidence of toe drag or knee instability.  Psychiatric:        Mood and Affect: Mood normal.        Behavior: Behavior normal.  No sensory deficits in  the lower extremities. Lumbar spine has 50% range of motion with flexion extension lateral bending and rotation. There is no tenderness palpation along the lumbar paraspinal area   FABER's: Negative  Thigh thrust test: Negative       Assessment & Plan:   1.  History of lumbar postlaminectomy syndrome with  increasing right hip flexor pain and weakness.  This is an area above the level of her fusion from a neurologic standpoint.  Would recommend repeat MRI of the lumbar spine.  Also had some L5 nerve root symptoms on the right side from a pain and sensory standpoint but no motor symptoms or signs. Have given the patient basic back exercise program I will see her back in 6 weeks to go over the MRI.  Consider EMG if MRI is unremarkable

## 2021-04-22 NOTE — Patient Instructions (Signed)
Back Exercises These exercises help to make your trunk and back strong. They also help to keep the lower back flexible. Doing these exercises can help to prevent or lessen pain in your lower back. If you have back pain, try to do these exercises 2-3 times each day or as told by your doctor. As you get better, do the exercises once each day. Repeat the exercises more often as told by your doctor. To stop back pain from coming back, do the exercises once each day, or as told by your doctor. Do exercises exactly as told by your doctor. Stop right away if you feel sudden pain or your pain gets worse. Exercises Single knee to chest Do these steps 3-5 times in a row for each leg: Lie on your back on a firm bed or the floor with your legs stretched out. Bring one knee to your chest. Grab your knee or thigh with both hands and hold it in place. Pull on your knee until you feel a gentle stretch in your lower back or butt. Keep doing the stretch for 10-30 seconds. Slowly let go of your leg and straighten it. Pelvic tilt Do these steps 5-10 times in a row: Lie on your back on a firm bed or the floor with your legs stretched out. Bend your knees so they point up to the ceiling. Your feet should be flat on the floor. Tighten your lower belly (abdomen) muscles to press your lower back against the floor. This will make your tailbone point up to the ceiling instead of pointing down to your feet or the floor. Stay in this position for 5-10 seconds while you gently tighten your muscles and breathe evenly. Cat-cow Do these steps until your lower back bends more easily: Get on your hands and knees on a firm bed or the floor. Keep your hands under your shoulders, and keep your knees under your hips. You may put padding under your knees. Let your head hang down toward your chest. Tighten (contract) the muscles in your belly. Point your tailbone toward the floor so your lower back becomes rounded like the back of a  cat. Stay in this position for 5 seconds. Slowly lift your head. Let the muscles of your belly relax. Point your tailbone up toward the ceiling so your back forms a sagging arch like the back of a cow. Stay in this position for 5 seconds.  Press-ups Do these steps 5-10 times in a row: Lie on your belly (face-down) on a firm bed or the floor. Place your hands near your head, about shoulder-width apart. While you keep your back relaxed and keep your hips on the floor, slowly straighten your arms to raise the top half of your body and lift your shoulders. Do not use your back muscles. You may change where you place your hands to make yourself more comfortable. Stay in this position for 5 seconds. Keep your back relaxed. Slowly return to lying flat on the floor.  Bridges Do these steps 10 times in a row: Lie on your back on a firm bed or the floor. Bend your knees so they point up to the ceiling. Your feet should be flat on the floor. Your arms should be flat at your sides, next to your body. Tighten your butt muscles and lift your butt off the floor until your waist is almost as high as your knees. If you do not feel the muscles working in your butt and the back of  your thighs, slide your feet 1-2 inches (2.5-5 cm) farther away from your butt. Stay in this position for 3-5 seconds. Slowly lower your butt to the floor, and let your butt muscles relax. If this exercise is too easy, try doing it with your arms crossed over your chest. Belly crunches Do these steps 5-10 times in a row: Lie on your back on a firm bed or the floor with your legs stretched out. Bend your knees so they point up to the ceiling. Your feet should be flat on the floor. Cross your arms over your chest. Tip your chin a little bit toward your chest, but do not bend your neck. Tighten your belly muscles and slowly raise your chest just enough to lift your shoulder blades a tiny bit off the floor. Avoid raising your body  higher than that because it can put too much stress on your lower back. Slowly lower your chest and your head to the floor. Back lifts Do these steps 5-10 times in a row: Lie on your belly (face-down) with your arms at your sides, and rest your forehead on the floor. Tighten the muscles in your legs and your butt. Slowly lift your chest off the floor while you keep your hips on the floor. Keep the back of your head in line with the curve in your back. Look at the floor while you do this. Stay in this position for 3-5 seconds. Slowly lower your chest and your face to the floor. Contact a doctor if: Your back pain gets a lot worse when you do an exercise. Your back pain does not get better within 2 hours after you exercise. If you have any of these problems, stop doing the exercises. Do not do them again unless your doctor says it is okay. Get help right away if: You have sudden, very bad back pain. If this happens, stop doing the exercises. Do not do them again unless your doctor says it is okay. This information is not intended to replace advice given to you by your health care provider. Make sure you discuss any questions you have with your health care provider. Document Revised: 06/09/2020 Document Reviewed: 06/09/2020 Elsevier Patient Education  [employer].

## 2021-04-23 ENCOUNTER — Telehealth: Payer: Self-pay | Admitting: Internal Medicine

## 2021-04-25 MED ORDER — HYDROCODONE-ACETAMINOPHEN 7.5-325 MG PO TABS: 1.0000 | ORAL_TABLET | Freq: Four times a day (QID) | ORAL | 0 refills | Status: DC | PRN

## 2021-04-25 NOTE — Telephone Encounter (Signed)
PDMP okay, Rx sent 

## 2021-04-25 NOTE — Telephone Encounter (Signed)
Patient is requesting a refill of the following medications: Requested Prescriptions   Pending Prescriptions Disp Refills   HYDROcodone-acetaminophen (NORCO) 7.5-325 MG tablet 120 tablet 0    Sig: Take 1 tablet by mouth every 6 (six) hours as needed for moderate pain.    Date of patient request: 04/23/21 Last office visit: 03/24/21 Date of last refill: 03/23/21 Last refill amount: 120 + 0 Follow up time period per chart: 07/26/21  Last UDS: 03/24/21 Last Contract: 01/29/20

## 2021-04-27 ENCOUNTER — Encounter: Payer: Self-pay | Admitting: *Deleted

## 2021-04-27 ENCOUNTER — Telehealth: Payer: Self-pay | Admitting: *Deleted

## 2021-04-27 DIAGNOSIS — M48062 Spinal stenosis, lumbar region with neurogenic claudication: Secondary | ICD-10-CM

## 2021-04-27 DIAGNOSIS — R29898 Other symptoms and signs involving the musculoskeletal system: Secondary | ICD-10-CM

## 2021-04-27 NOTE — Telephone Encounter (Signed)
Kirsteins, Luanna Salk, MD  Caro Hight, RN Please place order for 2 v lumbar spine, I gave her exercise for back, will see her in 6 wks   Order placed and pt notified.

## 2021-05-02 ENCOUNTER — Ambulatory Visit (HOSPITAL_BASED_OUTPATIENT_CLINIC_OR_DEPARTMENT_OTHER)
Admission: RE | Admit: 2021-05-02 | Discharge: 2021-05-02 | Disposition: A | Discharge status: Home / Self Care | Payer: Medicare HMO | Source: Ambulatory Visit | Attending: Physical Medicine & Rehabilitation | Admitting: Physical Medicine & Rehabilitation

## 2021-05-02 ENCOUNTER — Other Ambulatory Visit: Payer: Self-pay

## 2021-05-02 DIAGNOSIS — M5137 Other intervertebral disc degeneration, lumbosacral region: Secondary | ICD-10-CM | POA: Diagnosis not present

## 2021-05-02 DIAGNOSIS — R29898 Other symptoms and signs involving the musculoskeletal system: Secondary | ICD-10-CM | POA: Insufficient documentation

## 2021-05-10 ENCOUNTER — Other Ambulatory Visit: Payer: Self-pay

## 2021-05-10 ENCOUNTER — Ambulatory Visit
Admission: RE | Admit: 2021-05-10 | Discharge: 2021-05-10 | Disposition: A | Discharge status: Home / Self Care | Payer: Medicare HMO | Source: Ambulatory Visit | Attending: Physical Medicine & Rehabilitation | Admitting: Physical Medicine & Rehabilitation

## 2021-05-10 DIAGNOSIS — G9519 Other vascular myelopathies: Secondary | ICD-10-CM | POA: Diagnosis not present

## 2021-05-10 DIAGNOSIS — M48061 Spinal stenosis, lumbar region without neurogenic claudication: Secondary | ICD-10-CM | POA: Diagnosis not present

## 2021-05-10 DIAGNOSIS — M4807 Spinal stenosis, lumbosacral region: Secondary | ICD-10-CM | POA: Diagnosis not present

## 2021-05-10 DIAGNOSIS — R29898 Other symptoms and signs involving the musculoskeletal system: Secondary | ICD-10-CM

## 2021-05-10 DIAGNOSIS — R531 Weakness: Secondary | ICD-10-CM | POA: Diagnosis not present

## 2021-05-10 MED ORDER — GADOBENATE DIMEGLUMINE 529 MG/ML IV SOLN
20.0000 mL | Freq: Once | INTRAVENOUS | Status: AC | PRN
  Administered 2021-05-10: 20 mL via INTRAVENOUS

## 2021-05-24 ENCOUNTER — Telehealth: Payer: Self-pay | Admitting: Internal Medicine

## 2021-05-25 DIAGNOSIS — L600 Ingrowing nail: Secondary | ICD-10-CM | POA: Diagnosis not present

## 2021-05-25 MED ORDER — HYDROCODONE-ACETAMINOPHEN 7.5-325 MG PO TABS: 1.0000 | ORAL_TABLET | Freq: Four times a day (QID) | ORAL | 0 refills | Status: DC | PRN

## 2021-05-25 NOTE — Telephone Encounter (Signed)
PDMP okay, Rx sent 

## 2021-05-25 NOTE — Telephone Encounter (Signed)
Requesting: hydrocodone 7.5-325mg   Contract: 01/29/2020 UDS:03/24/2021 Last Visit: 03/24/2021 Next Visit: 07/26/2021 Last Refill: 04/25/2021 #120 and 0RF Pt sig: 1 tab q6h prn  Please Advise

## 2021-06-03 ENCOUNTER — Encounter: Payer: Self-pay | Admitting: Physical Medicine & Rehabilitation

## 2021-06-03 ENCOUNTER — Encounter: Payer: Medicare HMO | Admitting: Physical Medicine & Rehabilitation

## 2021-06-03 ENCOUNTER — Encounter: Payer: Medicare HMO | Attending: Physical Medicine & Rehabilitation | Admitting: Physical Medicine & Rehabilitation

## 2021-06-03 ENCOUNTER — Other Ambulatory Visit: Payer: Self-pay

## 2021-06-03 VITALS — BP 123/81 | HR 64 | Ht 64.0 in | Wt 187.0 lb

## 2021-06-03 DIAGNOSIS — M47816 Spondylosis without myelopathy or radiculopathy, lumbar region: Secondary | ICD-10-CM | POA: Insufficient documentation

## 2021-06-03 DIAGNOSIS — M48062 Spinal stenosis, lumbar region with neurogenic claudication: Secondary | ICD-10-CM | POA: Diagnosis not present

## 2021-06-03 NOTE — Patient Instructions (Signed)

## 2021-06-03 NOTE — Progress Notes (Signed)
Subjective:    Patient ID: [name], female    DOB: 09-21-1945, 76 y.o.   MRN: [id number]  HPI 76 year old female with history of L2-3 L3-4 anterior fusion who returns today with complaints of right-sided low back pain.  The patient had another MRI and this was compared to prior MRI report from [facility] performed on 10/30/2019 which was performed preoperatively.  Her current MRI shows worsening of degenerative stenosis at L1-L2 as well as L4-5 and L5-S1.  Facet degenerative changes were seen mainly at L4-5 L5-S1. The patient still has symptoms occasionally going to the right big toe at night but this is not frequent.  Pain scores are in the moderate range pain worsens with walking bending sitting standing improving with rest ice and medication. She has had lumbar injections in the past with Dr. Herma Mering however she does not remember any details she does remember that 1 time she turned very red in the face and this lasted a day or 2.  The patient did have an injection using dexamethasone in June 2022 by Dr. Nelva Bush for the diagnosis of lumbar radiculopathy.  She denies having any procedures such as radiofrequency neurotomy.  MRI LUMBAR SPINE WITHOUT AND WITH CONTRAST   TECHNIQUE: Multiplanar and multiecho pulse sequences of the lumbar spine were obtained without and with intravenous contrast.   CONTRAST:  76mL MULTIHANCE GADOBENATE DIMEGLUMINE 529 MG/ML IV SOLN   COMPARISON:  07/24/2015   FINDINGS: Segmentation:  5 lumbar type vertebrae as previously numbered   Alignment: L1-2 retrolisthesis measuring 3 mm. Mild anterolisthesis at L5-S1.   Vertebrae: Mild discogenic endplate edema at T9-0 and L5-S1. Remote superior endplate fracture of Z00.   Conus medullaris and cauda equina: Conus extends to the L1 level. Conus and cauda equina appear normal.   Paraspinal and other soft tissues: Negative   Disc levels:   T12- L1: Mild facet spurring   L1-L2: Disc narrowing and bulging.  Ligamentum flavum thickening and mild facet spurring. High-grade spinal stenosis. Moderate left foraminal narrowing   L2-L3: Interval PLIF. No neural impingement. Probable solid arthrodesis   L3-L4: Is interval PLIF. No neural impingement. Probable solid arthrodesis.   L4-L5: Disc narrowing and bulging. Degenerative facet spurring on both sides. High-grade spinal stenosis. Patent foramina   L5-S1:Disc narrowing and bulging. Degenerative facet spurring on both sides. Mild right subarticular recess narrowing that is progressed. Moderate bilateral foraminal narrowing.   IMPRESSION: 1. Interval L2-3 and L3-4 PLIF with probable solid arthrodesis. No recurrent impingement. There has been progression of degenerative disease at the remaining open lumbar levels. Mild discogenic edema at L1-2 and L5-S1. 2. L1-2 high-grade spinal stenosis and moderate left foraminal narrowing. 3. L4-5 high-grade spinal stenosis. 4. L5-S1 right more than left subarticular recess narrowing. Moderate bilateral foraminal narrowing.     Electronically Signed   By: Jorje Guild M.D.   On: 05/11/2021 11:29 Pain Inventory Average Pain 6 Pain Right Now 2 My pain is constant, burning, dull, and aching  In the last 24 hours, has pain interfered with the following? General activity 6 Relation with others 2 Enjoyment of life 6 What TIME of day is your pain at its worst? daytime, evening, and night Sleep (in general) Poor  Pain is worse with: walking, bending, sitting, and standing Pain improves with: rest, heat/ice, and medication Relief from Meds: 7  Family History  Adopted: Yes  Problem Relation Age of Onset   Cancer Neg Hx    Depression Neg Hx    Diabetes  Neg Hx    Stroke Neg Hx    Hypertension Neg Hx    Heart disease Neg Hx    Social History   Socioeconomic History   Marital status: Married    Spouse name: Not on file   Number of children: 0   Years of education: Not on file   Highest  education level: Not on file  Occupational History   Occupation: retired, 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  Tobacco Use   Smoking status: Former    Packs/day: 1.00    Years: 38.50    Pack years: 38.50    Types: Cigarettes    Quit date: 01/13/2004    Years since quitting: 17.4   Smokeless tobacco: Never   Tobacco comments:    quit 2005  Vaping Use   Vaping Use: Never used  Substance and Sexual Activity   Alcohol use: No    Alcohol/week: 0.0 standard drinks    Comment: rare   Drug use: No   Sexual activity: Not Currently  Other Topics Concern   Not on file  Social History Narrative   No biological children      Social Determinants of Health   Financial Resource Strain: Low Risk    Difficulty of Paying Living Expenses: Not hard at all  Food Insecurity: No Food Insecurity   Worried About Charity fundraiser in the Last Year: Never true   Ran Out of Food in the Last Year: Never true  Transportation Needs: Not on file  Physical Activity: Inactive   Days of Exercise per Week: 0 days   Minutes of Exercise per Session: 0 min  Stress: No Stress Concern Present   Feeling of Stress : Not at all  Social Connections: Moderately Isolated   Frequency of Communication with Friends and Family: More than three times a week   Frequency of Social Gatherings with Friends and Family: More than three times a week   Attends Religious Services: Never   Marine scientist or Organizations: No   Attends Archivist Meetings: Never   Marital Status: Married   Past Surgical History:  Procedure Laterality Date   ANTERIOR LAT LUMBAR FUSION N/A 12/03/2019   Procedure: ANTERIOR LATERAL LUMBAR FUSION L2-4;  Surgeon: Melina Schools, MD;  Location: [place];  Service: Orthopedics;  Laterality: N/A;  4 hrs   COLONOSCOPY     COSMETIC SURGERY  04/12/2018   L upper eyelid ptosis repair, and trichophytic brow lift   ESOPHAGOGASTRODUODENOSCOPY (EGD) WITH PROPOFOL N/A 03/27/2013   Procedure:  ESOPHAGOGASTRODUODENOSCOPY (EGD) WITH PROPOFOL;  Surgeon: Milus Banister, MD;  Location: [facility];  Service: Endoscopy;  Laterality: N/A;  possible dil   EYE SURGERY     growth on cornea-bil.   plastic surgery face  2013   POLYPECTOMY     right rotator cuff repair  2010   TONSILLECTOMY     UPPER GASTROINTESTINAL ENDOSCOPY     Past Surgical History:  Procedure Laterality Date   ANTERIOR LAT LUMBAR FUSION N/A 12/03/2019   Procedure: ANTERIOR LATERAL LUMBAR FUSION L2-4;  Surgeon: Melina Schools, MD;  Location: [place];  Service: Orthopedics;  Laterality: N/A;  4 hrs   COLONOSCOPY     COSMETIC SURGERY  04/12/2018   L upper eyelid ptosis repair, and trichophytic brow lift   ESOPHAGOGASTRODUODENOSCOPY (EGD) WITH PROPOFOL N/A 03/27/2013   Procedure: ESOPHAGOGASTRODUODENOSCOPY (EGD) WITH PROPOFOL;  Surgeon: Milus Banister, MD;  Location: [facility];  Service: Endoscopy;  Laterality: N/A;  possible dil   EYE SURGERY     growth on cornea-bil.   plastic surgery face  2013   POLYPECTOMY     right rotator cuff repair  2010   TONSILLECTOMY     UPPER GASTROINTESTINAL ENDOSCOPY     Past Medical History:  Diagnosis Date   Arthritis    Cataract    both eyes rt. 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   Fibromyalgia    see rheumatology   GERD (gastroesophageal reflux disease)    PAST HX    Hyperlipidemia    Hypothyroidism    Migraine    Ocular rosacea 03/2009   on doxy   Osteoporosis    osteopenia   Pleuritic chest pain    etiology unclear, has beeb eval by pulmonary and rheumatology (autoimmune dz?)   Wrist fracture 2018   BP 123/81    Pulse 64    Ht 5\' 4"  (1.626 m)    Wt 187 lb (84.8 kg)    LMP 04/10/1998 (Approximate)    SpO2 99%    BMI 32.10 kg/m   Opioid Risk Score:   Fall Risk  Score:  `1  Depression screen PHQ 2/9  Depression screen [facility] 2/9 06/03/2021 04/22/2021 03/24/2021 10/18/2020 09/29/2020 08/12/2020 07/20/2020  Decreased Interest 0 0 0 0 0 0 0  Down, Depressed, Hopeless 0 0 0 0 0 0 0  PHQ - 2 Score 0 0 0 0 0 0 0  Altered sleeping - 0 0 0 - - 2  Tired, decreased energy - 0 0 0 - - 2  Change in appetite - 0 0 0 - - 2  Feeling bad or failure about yourself  - 0 0 0 - - 0  Trouble concentrating - 0 0 0 - - 0  Moving slowly or fidgety/restless - 0 0 0 - - 0  Suicidal thoughts - 0 0 0 - - 0  PHQ-9 Score - 0 0 0 - - 6  Difficult doing work/chores - Not difficult at all - - - - Not difficult at all  Some recent data might be hidden     Review of Systems  Musculoskeletal:  Positive for back pain.       Right hip pain  Neurological:  Positive for weakness.  All other systems reviewed and are negative.     Objective:   Physical Exam Vitals and nursing note reviewed.  Constitutional:      Appearance: She is normal weight.  HENT:     Head: Normocephalic and atraumatic.  Eyes:     Extraocular Movements: Extraocular movements intact.     Conjunctiva/sclera: Conjunctivae normal.     Pupils: Pupils are equal, round, and reactive to light.  Musculoskeletal:     Comments: Patient has 25 % lumbar extension accompanied by pain, 75% of the lumbar flexion without pain.  No pain with lateral bending and approximately 50 to 75% of normal range bilaterally. There is mild tenderness palpation lumbar paraspinals right side. Hip internal/external rotation is limited bilaterally there is some pulling sensation in the posterior hip/buttock area particularly on the right side.  Neurological:     Mental Status: She is alert and oriented to person, place, and time.  Comments: Motor strength is 5/5 bilateral hip flexor knee extensor ankle dorsiflexor and plantar flexor Negative straight leg raising bilaterally Sensation intact light touch both lower limbs  Psychiatric:         Mood and Affect: Mood normal.        Behavior: Behavior normal.          Assessment & Plan:   #1.  Right-sided lumbar pain, there is no consistent radicular pain down the right lower extremity.  We discussed MRI findings using a spine model as well as pulling up the actual films discussing the differences between the current exam as well as the prior one from 2021.  We discussed treatments that she is already on undergone including 3 rounds of physical therapy as well as her recent home exercise program for back exercises.  The patient has tried medication management At this point given failure of conservative care, schedule for lumbar medial branch blocks L3-4-5 on the right side. 2.  Right hip buttock pain appears to be mostly related to gluteus medius syndrome has previous MRI of the hip from 2017 demonstrating gluteus tendinosis.

## 2021-06-08 DIAGNOSIS — L600 Ingrowing nail: Secondary | ICD-10-CM | POA: Diagnosis not present

## 2021-06-17 DIAGNOSIS — L600 Ingrowing nail: Secondary | ICD-10-CM | POA: Diagnosis not present

## 2021-06-17 DIAGNOSIS — M792 Neuralgia and neuritis, unspecified: Secondary | ICD-10-CM | POA: Diagnosis not present

## 2021-06-21 ENCOUNTER — Telehealth: Payer: Self-pay | Admitting: Internal Medicine

## 2021-06-21 MED ORDER — HYDROCODONE-ACETAMINOPHEN 7.5-325 MG PO TABS: 1.0000 | ORAL_TABLET | Freq: Four times a day (QID) | ORAL | 0 refills | Status: DC | PRN

## 2021-06-21 NOTE — Telephone Encounter (Signed)
PDMP okay, Rx sent 

## 2021-06-21 NOTE — Telephone Encounter (Signed)
Requesting: alprazolam 0.'5mg'$  ?Contract: 01/29/2020 ?UDS: 03/24/2021 ?Last Visit: 03/24/2021 ?Next Visit: 07/26/2021 ?Last Refill: 03/23/2021 #30 and 2RF ? ?Please Advise ? ?

## 2021-06-21 NOTE — Telephone Encounter (Signed)
Requesting: hydrocodone 7.5-'325mg'$  ?Contract: 01/29/20 ?UDS: 03/24/2021 ?Last Visit: 03/24/2021 ?Next Visit:  07/26/2021 ?Last Refill: 05/25/2021 #120 and 0RF ?Pt sig: 1 tab q6h prn ? ?Please Advise ? ?

## 2021-06-23 DIAGNOSIS — E785 Hyperlipidemia, unspecified: Secondary | ICD-10-CM | POA: Diagnosis not present

## 2021-06-23 DIAGNOSIS — M792 Neuralgia and neuritis, unspecified: Secondary | ICD-10-CM | POA: Diagnosis not present

## 2021-06-23 DIAGNOSIS — I10 Essential (primary) hypertension: Secondary | ICD-10-CM | POA: Diagnosis not present

## 2021-06-23 DIAGNOSIS — H04129 Dry eye syndrome of unspecified lacrimal gland: Secondary | ICD-10-CM | POA: Diagnosis not present

## 2021-06-23 DIAGNOSIS — Z6831 Body mass index (BMI) 31.0-31.9, adult: Secondary | ICD-10-CM | POA: Diagnosis not present

## 2021-06-23 DIAGNOSIS — K5900 Constipation, unspecified: Secondary | ICD-10-CM | POA: Diagnosis not present

## 2021-06-23 DIAGNOSIS — E669 Obesity, unspecified: Secondary | ICD-10-CM | POA: Diagnosis not present

## 2021-06-23 DIAGNOSIS — E039 Hypothyroidism, unspecified: Secondary | ICD-10-CM | POA: Diagnosis not present

## 2021-06-23 DIAGNOSIS — G8929 Other chronic pain: Secondary | ICD-10-CM | POA: Diagnosis not present

## 2021-06-23 DIAGNOSIS — J439 Emphysema, unspecified: Secondary | ICD-10-CM | POA: Diagnosis not present

## 2021-06-23 DIAGNOSIS — M810 Age-related osteoporosis without current pathological fracture: Secondary | ICD-10-CM | POA: Diagnosis not present

## 2021-06-23 DIAGNOSIS — G4700 Insomnia, unspecified: Secondary | ICD-10-CM | POA: Diagnosis not present

## 2021-07-21 ENCOUNTER — Telehealth: Payer: Self-pay | Admitting: Internal Medicine

## 2021-07-21 MED ORDER — HYDROCODONE-ACETAMINOPHEN 7.5-325 MG PO TABS: 1.0000 | ORAL_TABLET | Freq: Four times a day (QID) | ORAL | 0 refills | Status: DC | PRN

## 2021-07-21 NOTE — Telephone Encounter (Signed)
PDMP okay, Rx sent 

## 2021-07-21 NOTE — Telephone Encounter (Signed)
Requesting: hydrocodone 7.5-'325mg'$   ?Contract:01/29/20 ?UDS:03/24/21 ?Last Visit: 03/24/21 ?Next Visit: 07/26/21 ?Last Refill: 06/21/21 #120 and 0RF ? ?Please Advise ? ?

## 2021-07-22 ENCOUNTER — Encounter: Payer: Self-pay | Admitting: Physical Medicine & Rehabilitation

## 2021-07-22 ENCOUNTER — Encounter: Payer: Medicare HMO | Attending: Physical Medicine & Rehabilitation | Admitting: Physical Medicine & Rehabilitation

## 2021-07-22 VITALS — BP 129/84 | HR 63 | Ht 64.0 in | Wt 187.0 lb

## 2021-07-22 DIAGNOSIS — M961 Postlaminectomy syndrome, not elsewhere classified: Secondary | ICD-10-CM | POA: Diagnosis not present

## 2021-07-22 DIAGNOSIS — M47816 Spondylosis without myelopathy or radiculopathy, lumbar region: Secondary | ICD-10-CM | POA: Diagnosis not present

## 2021-07-22 DIAGNOSIS — M79672 Pain in left foot: Secondary | ICD-10-CM | POA: Insufficient documentation

## 2021-07-22 NOTE — Progress Notes (Addendum)
? ?Subjective:  ? ? Patient ID: [name], female    DOB: 05-04-1945, 76 y.o.   MRN: [id number] ? ?HPI ?76 year old female with lumbar postlaminectomy syndrome as well as chronic low back pain.  Most recent imaging studies did demonstrate bilateral L4-5 and L5-S1 facet arthropathy.  She has no radicular pain.  She has developed nocturnal heel pain on the left side.  She was checked by podiatry and they did not feel this represented a neuropathy. ?The patient denies any numbness or tingling in that area.  She states that her pain is relieved with elevating her heel on a pillow.  She states that she only sleeps on her back and does not move much during the night. ? ?We discussed insurance denial of lumbar medial branch blocks.  We discussed other treatment options including physical therapy.  She has gone through this in the past about 1 year ago but would like to try this again using aquatic therapy. ?Pain Inventory ?Average Pain 4 ?Pain Right Now 5 ?My pain is constant and aching ? ?In the last 24 hours, has pain interfered with the following? ?General activity 6 ?Relation with others 2 ?Enjoyment of life 6 ?What TIME of day is your pain at its worst? evening ?Sleep (in general) Fair ? ?Pain is worse with: walking, bending, sitting, standing, and some activites ?Pain improves with: rest, heat/ice, and medication ?Relief from Meds: 7 ? ?Family History  ?Adopted: Yes  ?Problem Relation Age of Onset  ? Cancer Neg Hx   ? Depression Neg Hx   ? Diabetes Neg Hx   ? Stroke Neg Hx   ? Hypertension Neg Hx   ? Heart disease Neg Hx   ? ?Social History  ? ?Socioeconomic History  ? Marital status: Married  ?  Spouse name: Not on file  ? Number of children: 0  ? Years of education: Not on file  ? Highest education level: Not on file  ?Occupational History  ? Occupation: retired, Medical sales representative work  ?  Employer: RETIRED  ?Tobacco Use  ? Smoking status: Former  ?  Packs/day: 1.00  ?  Years: 38.50  ?  Pack years: 38.50  ?  Types:  Cigarettes  ?  Quit date: 01/13/2004  ?  Years since quitting: 17.5  ? Smokeless tobacco: Never  ? Tobacco comments:  ?  quit 2005  ?Vaping Use  ? Vaping Use: Never used  ?Substance and Sexual Activity  ? Alcohol use: No  ?  Alcohol/week: 0.0 standard drinks  ?  Comment: rare  ? Drug use: No  ? Sexual activity: Not Currently  ?Other Topics Concern  ? Not on file  ?Social History Narrative  ? No biological children  ?   ? ?Social Determinants of Health  ? ?Financial Resource Strain: Low Risk   ? Difficulty of Paying Living Expenses: Not hard at all  ?Food Insecurity: No Food Insecurity  ? Worried About Charity fundraiser in the Last Year: Never true  ? Ran Out of Food in the Last Year: Never true  ?Transportation Needs: Not on file  ?Physical Activity: Inactive  ? Days of Exercise per Week: 0 days  ? Minutes of Exercise per Session: 0 min  ?Stress: No Stress Concern Present  ? Feeling of Stress : Not at all  ?Social Connections: Moderately Isolated  ? Frequency of Communication with Friends and Family: More than three times a week  ? Frequency of Social Gatherings with Friends and Family: More  than three times a week  ? Attends Religious Services: Never  ? Active Member of Clubs or Organizations: No  ? Attends Archivist Meetings: Never  ? Marital Status: Married  ? ?Past Surgical History:  ?Procedure Laterality Date  ? ANTERIOR LAT LUMBAR FUSION N/A 12/03/2019  ? Procedure: ANTERIOR LATERAL LUMBAR FUSION L2-4;  Surgeon: Melina Schools, MD;  Location: [place];  Service: Orthopedics;  Laterality: N/A;  4 hrs  ? COLONOSCOPY    ? COSMETIC SURGERY  04/12/2018  ? L upper eyelid ptosis repair, and trichophytic brow lift  ? ESOPHAGOGASTRODUODENOSCOPY (EGD) WITH PROPOFOL N/A 03/27/2013  ? Procedure: ESOPHAGOGASTRODUODENOSCOPY (EGD) WITH PROPOFOL;  Surgeon: Milus Banister, MD;  Location: [facility];  Service: Endoscopy;  Laterality: N/A;  possible dil  ? EYE SURGERY    ? growth on cornea-bil.  ? plastic surgery face   2013  ? POLYPECTOMY    ? right rotator cuff repair  2010  ? TONSILLECTOMY    ? UPPER GASTROINTESTINAL ENDOSCOPY    ? ?Past Surgical History:  ?Procedure Laterality Date  ? ANTERIOR LAT LUMBAR FUSION N/A 12/03/2019  ? Procedure: ANTERIOR LATERAL LUMBAR FUSION L2-4;  Surgeon: Melina Schools, MD;  Location: [place];  Service: Orthopedics;  Laterality: N/A;  4 hrs  ? COLONOSCOPY    ? COSMETIC SURGERY  04/12/2018  ? L upper eyelid ptosis repair, and trichophytic brow lift  ? ESOPHAGOGASTRODUODENOSCOPY (EGD) WITH PROPOFOL N/A 03/27/2013  ? Procedure: ESOPHAGOGASTRODUODENOSCOPY (EGD) WITH PROPOFOL;  Surgeon: Milus Banister, MD;  Location: [facility];  Service: Endoscopy;  Laterality: N/A;  possible dil  ? EYE SURGERY    ? growth on cornea-bil.  ? plastic surgery face  2013  ? POLYPECTOMY    ? right rotator cuff repair  2010  ? TONSILLECTOMY    ? UPPER GASTROINTESTINAL ENDOSCOPY    ? ?Past Medical History:  ?Diagnosis Date  ? Arthritis   ? Cataract   ? both eyes rt. eye was removed  ? Chest pain   ? stress test @ [place] [facility] (-)  ? Colonic polyp   ? bx adenomatous polyps, next scope 2010  ? COPD (chronic obstructive pulmonary disease) ([place])   ? Cornea disorder   ? 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 Fibromyalgia   ? see rheumatology  ? GERD (gastroesophageal reflux disease)   ? PAST HX   ? Hyperlipidemia   ? Hypothyroidism   ? Migraine   ? 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 on doxy  ? Osteoporosis   ? osteopenia  ? Pleuritic chest pain   ? etiology unclear, has beeb eval by pulmonary and rheumatology (autoimmune dz?)  ? Wrist fracture 2018  ? ?BP 129/84   Pulse 63   Ht '5\' 4"'$  (1.626 m)   Wt 187 lb (84.8 kg)   LMP 04/10/1998 (Approximate)   SpO2 97%   BMI 32.10 kg/m?  ? ?Opioid Risk Score:   ?Fall Risk Score:  `1 ? ?Depression screen PHQ 2/9 ? ? ?  07/22/2021  ?  1:12 PM 06/03/2021  ? 10:08 AM 04/22/2021  ? 10:18 AM 03/24/2021  ? 11:09 AM 10/18/2020  ? 12:59 PM 09/29/2020  ? 10:29 AM 08/12/2020  ?  8:35 AM  ?Depression screen PHQ  2/9  ?Decreased Interest 0 0 0 0 0 0 0  ?Down, Depressed, Hopeless 0 0 0 0 0 0 0  ?PHQ - 2 Score 0 0 0 0 0 0 0  ?Altered sleeping   0 0  0    ?Tired, decreased energy   0 0 0    ?Change in appetite   0 0 0    ?Feeling bad or failure about yourself    0 0 0    ?Trouble concentrating   0 0 0    ?Moving slowly or fidgety/restless   0 0 0    ?Suicidal thoughts   0 0 0    ?PHQ-9 Score   0 0 0    ?Difficult doing work/chores   Not difficult at all      ?  ? ?Review of Systems  ?Constitutional: Negative.   ?HENT: Negative.    ?Eyes: Negative.   ?Respiratory: Negative.    ?Cardiovascular: Negative.   ?Gastrointestinal: Negative.   ?Endocrine: Negative.   ?Genitourinary: Negative.   ?Musculoskeletal:  Positive for back pain.  ?Skin: Negative.   ?Allergic/Immunologic: Negative.   ?Neurological: Negative.   ?Hematological: Negative.   ?Psychiatric/Behavioral: Negative.    ?All other systems reviewed and are negative. ? ?   ?Objective:  ? Physical Exam ?Vitals and nursing note reviewed.  ?Constitutional:   ?   Appearance: She is obese.  ?HENT:  ?   Head: Normocephalic and atraumatic.  ?Eyes:  ?   Extraocular Movements: Extraocular movements intact.  ?   Conjunctiva/sclera: Conjunctivae normal.  ?   Pupils: Pupils are equal, round, and reactive to light.  ?Musculoskeletal:  ?   Comments: There is mild tenderness palpation lumbar paraspinal areas bilaterally. ?There is limited lumbar extension.  No significant pain with lumbar flexion. ?Left heel has no evidence of discoloration there is no ankle joint swelling.  No erythema.  No evidence of effusion or deformity.  No skin lesions.  Foot is warm   ?Skin: ?   General: Skin is warm and dry.  ?Neurological:  ?   General: No focal deficit present.  ?   Mental Status: She is alert and oriented to person, place, and time. Mental status is at baseline.  ?Psychiatric:     ?   Mood and Affect: Mood normal.     ?   Behavior: Behavior normal.  ? ? ? ? ? ?   ?Assessment & Plan:  ?1.  Lumbar  postlaminectomy syndrome L2-3, L3-L4 fusion as well as lumbar spondylosis without myelopathy, medial branch blocks will be helpful in delineating pain generator.  We will start out with repeating physical thera

## 2021-07-25 ENCOUNTER — Telehealth: Payer: Self-pay

## 2021-07-25 MED ORDER — HYDROCODONE-ACETAMINOPHEN 7.5-300 MG PO TABS: 1.0000 | ORAL_TABLET | Freq: Four times a day (QID) | ORAL | 0 refills | Status: DC | PRN

## 2021-07-25 NOTE — Progress Notes (Signed)
75 y.o. G8P0000 Married Caucasian female here for office visit.  ? ?Patient is also followed for vaginal atrophy and uses Yuvafem. She needs refill and would like a monthly refill. ?No dryness or irritation.  ?Not sexually active.  ?Has some irritation from time to time at the introitus. ? ?Had back surgery.  ? ?Having some urinary urgency.  ?No leakage.  ?Up twice a night to void because her back pain is waking her up.  ?Declines medication for her bladder.  ? ?PCP:   Kathlene November, MD ?Endocrinology:  Dr. Cruzita Lederer ? ?Patient's last menstrual period was 04/10/1998 (approximate).     ?  ?    ?Sexually active: No.  ?The current method of family planning is post menopausal status.    ?Exercising: No.  The patient does not participate in regular exercise at present. ?Smoker:  Former ? ?Health Maintenance: ?Pap:  01-15-19 Neg, 01/03/2017 neg, 01-08-12 Neg ?History of abnormal Pap:  no ?MMG:  03-29-21 Neg/BiRads1 ?Colonoscopy:  09-03-20 polyp removed ?BMD: 03-29-21  Result :Osteoporosis, on Prolia.  ?TDaP:  2017 ?Gardasil:   n/a ?HIV: unsure ?Hep C: 09-08-15 Neg ?Screening Labs: PCP ? ? reports that she quit smoking about 17 years ago. Her smoking use included cigarettes. She has a 38.50 pack-year smoking history. She has never used smokeless tobacco. She reports that she does not drink alcohol and does not use drugs. ? ?Past Medical History:  ?Diagnosis Date  ? Arthritis   ? Cataract   ? both eyes rt. eye was removed  ? Chest pain   ? stress test @ [place] [facility] (-)  ? Colonic polyp   ? bx adenomatous polyps, next scope 2010  ? COPD (chronic obstructive pulmonary disease) ([place])   ? Cornea disorder   ? 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 Fibromyalgia   ? see rheumatology  ? GERD (gastroesophageal reflux disease)   ? PAST HX   ? Hyperlipidemia   ? Hypothyroidism   ? Migraine   ? 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 on doxy  ? Osteoporosis   ? osteopenia  ? Pleuritic chest pain   ? etiology unclear, has beeb eval by pulmonary and rheumatology  (autoimmune dz?)  ? Wrist fracture 2018  ? ? ?Past Surgical History:  ?Procedure Laterality Date  ? ANTERIOR LAT LUMBAR FUSION N/A 12/03/2019  ? Procedure: ANTERIOR LATERAL LUMBAR FUSION L2-4;  Surgeon: Melina Schools, MD;  Location: [facility];  Service: Orthopedics;  Laterality: N/A;  4 hrs  ? COLONOSCOPY    ? COSMETIC SURGERY  04/12/2018  ? L upper eyelid ptosis repair, and trichophytic brow lift  ? ESOPHAGOGASTRODUODENOSCOPY (EGD) WITH PROPOFOL N/A 03/27/2013  ? Procedure: ESOPHAGOGASTRODUODENOSCOPY (EGD) WITH PROPOFOL;  Surgeon: Milus Banister, MD;  Location: [facility];  Service: Endoscopy;  Laterality: N/A;  possible dil  ? EYE SURGERY    ? growth on cornea-bil.  ? plastic surgery face  2013  ? POLYPECTOMY    ? right rotator cuff repair  2010  ? TONSILLECTOMY    ? UPPER GASTROINTESTINAL ENDOSCOPY    ? ? ?Current Outpatient Medications  ?Medication Sig Dispense Refill  ? ALPRAZolam (XANAX) 0.5 MG tablet TAKE 1 TABLET BY MOUTH AT BEDTIME AS NEEDED FOR ANXIETY 30 tablet 3  ? aspirin 81 MG EC tablet Take 81 mg by mouth daily. Swallow whole.    ? calcium carbonate (OS-CAL) 1250 (500 CA) MG chewable tablet Chew 1 tablet by mouth 2 (two) times daily.     ? Cholecalciferol (VITAMIN  D3) 50 MCG (2000 UT) CHEW Chew 2,000 Units by mouth daily.     ? cycloSPORINE (RESTASIS) 0.05 % ophthalmic emulsion 1 drop 2 (two) times daily.    ? denosumab (PROLIA) 60 MG/ML SOSY injection Inject 60 mg into the skin every 6 (six) months. 1 mL 0  ? Estradiol (YUVAFEM) 10 MCG TABS vaginal tablet PLACE 1 TABLET VAGINALLY 2 TIMES A WEEK. 8 tablet 11  ? fluticasone-salmeterol (ADVAIR) 100-50 MCG/ACT AEPB Inhale 1 puff into the lungs at bedtime as needed. 60 each 5  ? hydrochlorothiazide (MICROZIDE) 12.5 MG capsule Take 1 capsule by mouth daily.    ? HYDROcodone-Acetaminophen 7.5-300 MG TABS Take 1 tablet by mouth 4 (four) times daily as needed.    ? hydroxypropyl methylcellulose / hypromellose (ISOPTO TEARS / GONIOVISC) 2.5 % ophthalmic  solution Place 1 drop into both eyes 3 (three) times daily as needed for dry eyes.    ? levothyroxine (SYNTHROID) 88 MCG tablet TAKE 1 TABLET BY MOUTH EVERY DAY BEFORE BREAKFAST 90 tablet 0  ? Multiple Vitamin (MULTIVITAMIN) capsule Take 1 capsule by mouth daily.      ? mupirocin ointment (BACTROBAN) 2 % Apply sparingly to toe nail bed bid    ? naloxone (NARCAN) 4 MG/0.1ML LIQD nasal spray kit Use as directed 2 each 0  ? pantoprazole (PROTONIX) 40 MG tablet Take 1 tablet (40 mg total) by mouth daily before breakfast. 30 tablet 3  ? pravastatin (PRAVACHOL) 20 MG tablet Take by mouth.    ? Probiotic Product (PROBIOTIC-10) CHEW Chew 1 tablet by mouth daily.    ? vitamin E 1000 UNIT capsule vitamin E    ? ?No current facility-administered medications for this visit.  ? ? ?Family History  ?Adopted: Yes  ?Problem Relation Age of Onset  ? Cancer Neg Hx   ? Depression Neg Hx   ? Diabetes Neg Hx   ? Stroke Neg Hx   ? Hypertension Neg Hx   ? Heart disease Neg Hx   ? ? ?Review of Systems  ?All other systems reviewed and are negative. ? ?Exam:   ?BP 118/74   Pulse 70   Ht 5' 4.5" (1.638 m)   Wt 187 lb (84.8 kg)   LMP 04/10/1998 (Approximate)   SpO2 94%   BMI 31.60 kg/m?     ?General appearance: alert, cooperative and appears stated age ?Head: normocephalic, without obvious abnormality, atraumatic ?Neck: no adenopathy, supple, symmetrical, trachea midline and thyroid normal to inspection and palpation ?Lungs: clear to auscultation bilaterally ?Breasts: normal appearance, no masses or tenderness, No nipple retraction or dimpling, No nipple discharge or bleeding, No axillary adenopathy ?Heart: regular rate and rhythm ?Abdomen: soft, non-tender; no masses, no organomegaly ?Extremities: extremities normal, atraumatic, no cyanosis or edema ?Skin: skin color, texture, turgor normal. No rashes or lesions ?Lymph nodes: cervical, supraclavicular, and axillary nodes normal. ?Neurologic: grossly normal ? ?Pelvic: External genitalia:   no lesions ?             No abnormal inguinal nodes palpated. ?             Urethra:  normal appearing urethra with no masses, tenderness or lesions ?             Bartholins and Skenes: normal    ?             Vagina: normal appearing vagina with normal color and discharge, no lesions ?  Cervix: no lesions ?             Pap taken: yes ?Bimanual Exam:  Uterus:  normal size, contour, position, consistency, mobility, non-tender ?             Adnexa: no mass, fullness, tenderness ?             Rectal exam: yes.  Confirms. ?             Anus:  normal sphincter tone, no lesions ? ?Chaperone was present for exam:  Estill Bamberg, CMA ? ?Assessment:   ?Vaginal atrophy.  ?Encounter for medication monitoring.  ? ?Plan: ?Mammogram screening discussed. ?Self breast awareness reviewed. ?Signs and symptoms of potential breast cancer reviewed.  ?Pap and HR HPV collected. ?Refill of Vagifem for one year.  I discussed potential effect on breast cancer.  ?I did let her know that vaginal estrogen cream to vagina and vulva could be alternative to the Vagifem.  ?Vaseline and hydrocortisone cream to vulva can be used to treat irritation as well. ? Follow up annually and prn.  ? ?After visit summary provided.  ? ?20 min  total time was spent for this patient encounter, including preparation, face-to-face counseling with the patient, coordination of care, and documentation of the encounter. ? ? ? ? ?

## 2021-07-25 NOTE — Telephone Encounter (Signed)
Dr. Larose Kells- I don't see where the new Rx was sent. Please advise.  ?

## 2021-07-25 NOTE — Telephone Encounter (Signed)
Pdmp ok , new Rx sent. ?

## 2021-07-25 NOTE — Telephone Encounter (Signed)
Received fax from [facility]- hydrocodone 7.5-'325mg'$  on back order, they do have hydrocodone 7.5-'300mg'$  and 10-'325mg'$  available.  ?

## 2021-07-26 ENCOUNTER — Encounter: Payer: Self-pay | Admitting: Internal Medicine

## 2021-07-26 ENCOUNTER — Ambulatory Visit (INDEPENDENT_AMBULATORY_CARE_PROVIDER_SITE_OTHER): Payer: Medicare HMO | Admitting: Internal Medicine

## 2021-07-26 VITALS — BP 116/82 | HR 69 | Temp 97.7°F | Resp 18 | Ht 64.0 in | Wt 186.5 lb

## 2021-07-26 DIAGNOSIS — E039 Hypothyroidism, unspecified: Secondary | ICD-10-CM

## 2021-07-26 DIAGNOSIS — R1310 Dysphagia, unspecified: Secondary | ICD-10-CM | POA: Diagnosis not present

## 2021-07-26 DIAGNOSIS — E785 Hyperlipidemia, unspecified: Secondary | ICD-10-CM | POA: Diagnosis not present

## 2021-07-26 DIAGNOSIS — M549 Dorsalgia, unspecified: Secondary | ICD-10-CM

## 2021-07-26 DIAGNOSIS — G8929 Other chronic pain: Secondary | ICD-10-CM

## 2021-07-26 LAB — LIPID PANEL
Cholesterol: 154 mg/dL (ref 0–200)
HDL: 54.3 mg/dL (ref >39.00)
LDL Cholesterol: 71 mg/dL (ref 0–99)
NonHDL: 99.6
Total CHOL/HDL Ratio: 3
Triglycerides: 143 mg/dL (ref 0.0–149.0)
VLDL: 28.6 mg/dL (ref 0.0–40.0)

## 2021-07-26 LAB — ALT: ALT: 11 U/L (ref 0–35)

## 2021-07-26 LAB — AST: AST: 14 U/L (ref 0–37)

## 2021-07-26 LAB — TSH: TSH: 1.4 u[IU]/mL (ref 0.35–5.50)

## 2021-07-26 MED ORDER — PANTOPRAZOLE SODIUM 40 MG PO TBEC: 40.0000 mg | DELAYED_RELEASE_TABLET | Freq: Every day | ORAL | 3 refills | Status: DC

## 2021-07-26 MED ORDER — HYDROCODONE-ACETAMINOPHEN 7.5-300 MG PO TABS: 1.0000 | ORAL_TABLET | Freq: Four times a day (QID) | ORAL | Status: DC | PRN

## 2021-07-26 MED ORDER — HYDROCODONE-ACETAMINOPHEN 7.5-300 MG PO TABS: 1.0000 | ORAL_TABLET | Freq: Four times a day (QID) | ORAL | 0 refills | Status: DC | PRN

## 2021-07-26 NOTE — Progress Notes (Signed)
? ?Subjective:  ? ? Patient ID: [name], female    DOB: Nov 19, 1945, 76 y.o.   MRN: [id number] ? ?DOS:  07/26/2021 ?Type of visit - description: f/u ?Today we review her chronic medical problems. ?Her main concern is she is unable to lose weight. ?Continue with back pain. ?Saw Dr. Letta Pate, note reviewed. ?History of CAD, denies chest pain or difficulty breathing. ?Continue with dysphagia with pills.  Denies nausea vomiting.  No blood in the stools. ?Had RUQ abd pain, see LOV, that has decreased but is still there mostly when she is constipated ? ? ?Review of Systems ?See above  ? ?Past Medical History:  ?Diagnosis Date  ? Arthritis   ? Cataract   ? both eyes rt. eye was removed  ? Chest pain   ? stress test @ [place] [facility] (-)  ? Colonic polyp   ? bx adenomatous polyps, next scope 2010  ? COPD (chronic obstructive pulmonary disease) ([place])   ? Cornea disorder   ? 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 Fibromyalgia   ? see rheumatology  ? GERD (gastroesophageal reflux disease)   ? PAST HX   ? Hyperlipidemia   ? Hypothyroidism   ? Migraine   ? 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 on doxy  ? Osteoporosis   ? osteopenia  ? Pleuritic chest pain   ? etiology unclear, has beeb eval by pulmonary and rheumatology (autoimmune dz?)  ? Wrist fracture 2018  ? ? ?Past Surgical History:  ?Procedure Laterality Date  ? ANTERIOR LAT LUMBAR FUSION N/A 12/03/2019  ? Procedure: ANTERIOR LATERAL LUMBAR FUSION L2-4;  Surgeon: Melina Schools, MD;  Location: [facility];  Service: Orthopedics;  Laterality: N/A;  4 hrs  ? COLONOSCOPY    ? COSMETIC SURGERY  04/12/2018  ? L upper eyelid ptosis repair, and trichophytic brow lift  ? ESOPHAGOGASTRODUODENOSCOPY (EGD) WITH PROPOFOL N/A 03/27/2013  ? Procedure: ESOPHAGOGASTRODUODENOSCOPY (EGD) WITH PROPOFOL;  Surgeon: Milus Banister, MD;  [facility];  Service: Endoscopy;  Laterality: N/A;  possible dil  ? EYE SURGERY    ? growth on cornea-bil.  ? plastic surgery face  2013  ? POLYPECTOMY    ? right  rotator cuff repair  2010  ? TONSILLECTOMY    ? UPPER GASTROINTESTINAL ENDOSCOPY    ? ? ?Current Outpatient Medications  ?Medication Instructions  ? ALPRAZolam (XANAX) 0.5 MG tablet TAKE 1 TABLET BY MOUTH AT BEDTIME AS NEEDED FOR ANXIETY  ? aspirin 81 mg, Oral, Daily, Swallow whole.  ? calcium carbonate (OS-CAL) 1250 (500 CA) MG chewable tablet 1 tablet, Oral, 2 times daily  ? cycloSPORINE (RESTASIS) 0.05 % ophthalmic emulsion 1 drop, 2 times daily  ? denosumab (PROLIA) 60 mg, Subcutaneous, Every 6 months  ? Estradiol (YUVAFEM) 10 MCG TABS vaginal tablet PLACE 1 TABLET VAGINALLY 2 TIMES A WEEK.  ? fluticasone-salmeterol (ADVAIR) 100-50 MCG/ACT AEPB 1 puff, Inhalation, At bedtime PRN  ? hydrochlorothiazide (MICROZIDE) 12.5 MG capsule 1 capsule, Oral, Daily  ? HYDROcodone-Acetaminophen 7.5-300 MG TABS 1 tablet, Oral, 4 times daily PRN  ? hydroxypropyl methylcellulose / hypromellose (ISOPTO TEARS / GONIOVISC) 2.5 % ophthalmic solution 1 drop, Both Eyes, 3 times daily PRN  ? levothyroxine (SYNTHROID) 88 MCG tablet TAKE 1 TABLET BY MOUTH EVERY DAY BEFORE BREAKFAST  ? Multiple Vitamin (MULTIVITAMIN) capsule 1 capsule, Oral, Daily  ? mupirocin ointment (BACTROBAN) 2 % Apply sparingly to toe nail bed bid  ? naloxone (NARCAN) 4 MG/0.1ML LIQD nasal spray kit Use as directed  ?  pantoprazole (PROTONIX) 40 mg, Oral, Daily before breakfast  ? pravastatin (PRAVACHOL) 20 MG tablet Oral  ? Probiotic Product (PROBIOTIC-10) CHEW 1 tablet, Oral, Daily  ? Vitamin D3 2,000 Units, Oral, Daily  ? vitamin E 1000 UNIT capsule vitamin E  ? ? ?   ?Objective:  ? Physical Exam ?BP 116/82 (BP Location: Left Arm, Patient Position: Sitting, Cuff Size: Small)   Pulse 69   Temp 97.7 ?F (36.5 ?C) (Oral)   Resp 18   Ht _0  (1.626 m)   Wt 186 lb 8 oz (84.6 kg)   LMP 04/10/1998 (Approximate)   SpO2 97%   BMI 32.01 kg/m?  ?General:   ?Well developed, NAD, BMI noted.  ?HEENT:  ?Normocephalic . Face symmetric, atraumatic ?Lungs:  ?CTA B ?Normal  respiratory effort, no intercostal retractions, no accessory muscle use. ?Heart: RRR,  no murmur.  ?Abdomen:  ?Not distended, soft, non-tender. No rebound or rigidity.   ?Skin: Not pale. Not jaundice ?Lower extremities: no pretibial edema bilaterally  ?Neurologic:  ?alert & oriented X3.  ?Speech normal, gait and transferring unassisted, slightly antalgic due to back pain. ?Psych--  ?Cognition and judgment appear intact.  ?Cooperative with normal attention span and concentration.  ?Behavior appropriate. ?No anxious or depressed appearing. ? ?   ?Assessment   ? ?Assessment ?Hypothyroidism ?Hyperlipidemia: Started meds 09/2019 ?GERD ?Migraines ?insmonia-- xanax prn ?COPD: per chest CTs, former heavy smoker, quit 2005 ?[facility]- Pain mngmt  ?-- back pain, 2017 had a MRI, saw Dr Durward Fortes, Rx local injection, PT ?--h/o fibromyalgia ?--H/o Pleuritic chest pain, on-off, resolved  ?--spinal stenosis (MRI 05/2018)  ?Osteopenia:  Per ENDO ?--T score 2011   -2.6 (intolerant to fosamax, ok w/ Boniva x years) ?--T score 01-2013   -2.6 ?--T score 02-2015  -2.3, rx ca and cit D  ?Glaucoma ?Ocular Rosacea 2010 ?CV: ?LAD  artery disease  per CT scan of the chest, CP:  admitted, stress lest low risk 05-2015.  Control CV RF ?CAD, aortic sclerosis: Per CT ? ?PLAN: ?Hypothyroidism: On Synthroid, check TSH ?High cholesterol: LDL December 2022 was 107, Rx Pravachol.  Check AST, ALT, FLP ?GERD: Currently with no symptoms, not on PPIs, has occasional dysphagia, had normal EGD 2014 for similar symptoms.  Plan: Trial with PPIs, ask GI to reassess. ?Dysphagia: See above ?RUQ abdominal pain: Since the last visit, US abdomen was negative ?Pain management: saw  Dr. Letta Pate 07/22/21, Dx postlaminectomy syndrome, lumbar spondylosis, no myelopathy.  Rx PT, aquatic therapy.  "Medial branch blocks will help delineate the pain generator".  Continue hydrocodone.  Rx was sent recently. ?Patient states she is not tolerant to gabapentin ?CAD: asx, not on aspirin,  recommend to restart. ?RTC CPX 3 to 4 months ?  ? ?This visit occurred during the SARS-CoV-2 public health emergency.  Safety protocols were in place, including screening questions prior to the visit, additional usage of staff PPE, and extensive cleaning of exam room while observing appropriate contact time as indicated for disinfecting solutions.  ? ?

## 2021-07-26 NOTE — Addendum Note (Signed)
Addended by: Kathlene November E on: 07/26/2021 09:11 AM ? ? Modules accepted: Orders ? ?

## 2021-07-26 NOTE — Patient Instructions (Addendum)
Start pantoprazole 40 mg 1 tablet before breakfast. ?Do not take pantoprazole together with Synthroid.  You can take your thyroid medication at noon before lunch. ? ? ?GO TO THE LAB : Get the blood work   ? ? ?[place], [place] ?Come back for   a physical exam in 3 to 4 months ?

## 2021-07-26 NOTE — Telephone Encounter (Signed)
I sent the prescription again just in case ?

## 2021-07-27 ENCOUNTER — Encounter: Payer: Self-pay | Admitting: Internal Medicine

## 2021-07-27 ENCOUNTER — Other Ambulatory Visit (HOSPITAL_COMMUNITY)
Admission: RE | Admit: 2021-07-27 | Discharge: 2021-07-27 | Disposition: A | Discharge status: Home / Self Care | Payer: Medicare HMO | Source: Ambulatory Visit | Attending: Obstetrics and Gynecology | Admitting: Obstetrics and Gynecology

## 2021-07-27 ENCOUNTER — Encounter: Payer: Self-pay | Admitting: Obstetrics and Gynecology

## 2021-07-27 ENCOUNTER — Ambulatory Visit (INDEPENDENT_AMBULATORY_CARE_PROVIDER_SITE_OTHER): Payer: Medicare HMO | Admitting: Obstetrics and Gynecology

## 2021-07-27 VITALS — BP 118/74 | HR 70 | Ht 64.5 in | Wt 187.0 lb

## 2021-07-27 DIAGNOSIS — Z124 Encounter for screening for malignant neoplasm of cervix: Secondary | ICD-10-CM

## 2021-07-27 DIAGNOSIS — N952 Postmenopausal atrophic vaginitis: Secondary | ICD-10-CM | POA: Diagnosis not present

## 2021-07-27 DIAGNOSIS — Z5181 Encounter for therapeutic drug level monitoring: Secondary | ICD-10-CM

## 2021-07-27 DIAGNOSIS — Z1151 Encounter for screening for human papillomavirus (HPV): Secondary | ICD-10-CM | POA: Insufficient documentation

## 2021-07-27 DIAGNOSIS — R69 Illness, unspecified: Secondary | ICD-10-CM | POA: Diagnosis not present

## 2021-07-27 MED ORDER — ESTRADIOL 10 MCG VA TABS: ORAL_TABLET | VAGINAL | 11 refills | Status: DC

## 2021-07-27 NOTE — Patient Instructions (Signed)

## 2021-07-27 NOTE — Assessment & Plan Note (Signed)
Hypothyroidism: On Synthroid, check TSH ?High cholesterol: LDL December 2022 was 107, Rx Pravachol.  Check AST, ALT, FLP ?GERD: Currently with no symptoms, not on PPIs, has occasional dysphagia, had normal EGD 2014 for similar symptoms.  Plan: Trial with PPIs, ask GI to reassess. ?Dysphagia: See above ?RUQ abdominal pain: Since the last visit, US abdomen was negative ?Pain management: saw  Dr. Letta Pate 07/22/21, Dx postlaminectomy syndrome, lumbar spondylosis, no myelopathy.  Rx PT, aquatic therapy.  "Medial branch blocks will help delineate the pain generator".  Continue hydrocodone.  Rx was sent recently. ?Patient states she is not tolerant to gabapentin ?CAD: asx, not on aspirin, recommend to restart. ?RTC CPX 3 to 4 months ?  ?

## 2021-07-28 ENCOUNTER — Other Ambulatory Visit: Payer: Self-pay | Admitting: Internal Medicine

## 2021-07-28 ENCOUNTER — Other Ambulatory Visit (HOSPITAL_BASED_OUTPATIENT_CLINIC_OR_DEPARTMENT_OTHER): Payer: Self-pay

## 2021-07-28 MED ORDER — HYDROCODONE-ACETAMINOPHEN 7.5-325 MG PO TABS
1.0000 | ORAL_TABLET | Freq: Four times a day (QID) | ORAL | 0 refills | Status: DC | PRN
  Filled 2021-07-28: qty 120, 30d supply, fill #0

## 2021-07-29 LAB — CYTOLOGY - PAP
Comment: NEGATIVE
Diagnosis: NEGATIVE
High risk HPV: NEGATIVE

## 2021-08-09 DIAGNOSIS — H04123 Dry eye syndrome of bilateral lacrimal glands: Secondary | ICD-10-CM | POA: Diagnosis not present

## 2021-08-09 DIAGNOSIS — H04332 Acute lacrimal canaliculitis of left lacrimal passage: Secondary | ICD-10-CM | POA: Diagnosis not present

## 2021-08-09 DIAGNOSIS — H2513 Age-related nuclear cataract, bilateral: Secondary | ICD-10-CM | POA: Diagnosis not present

## 2021-08-09 DIAGNOSIS — H5111 Convergence insufficiency: Secondary | ICD-10-CM | POA: Diagnosis not present

## 2021-08-17 NOTE — Progress Notes (Addendum)
? ?Subjective:  ? [name] is a 76 y.o. female who presents for Medicare Annual (Subsequent) preventive examination. ? ?Review of Systems    ? ?Cardiac Risk Factors include: advanced age (>73mn, >>73women) ? ?   ?Objective:  ?  ?Today's Vitals  ? 08/18/21 0826  ?BP: 104/71  ?Pulse: 64  ?Resp: 16  ?Temp: 98.3 ?F (36.8 ?C)  ?SpO2: 98%  ?Weight: 186 lb 12.8 oz (84.7 kg)  ?Height: 5' 4" (1.626 m)  ? ?Body mass index is 32.06 kg/m?. ? ? ?  08/18/2021  ?  8:28 AM 06/03/2021  ? 10:09 AM 10/28/2020  ? 11:06 AM 08/12/2020  ?  8:34 AM 08/10/2020  ? 10:17 AM 12/04/2019  ?  8:00 AM 12/01/2019  ?  9:26 AM  ?Advanced Directives  ?Does Patient Have a Medical Advance Directive? _0  No No  ?Type of [place]of [place] of facility DNR (pink MOST or yellow form);Living will [place] will;Out of facility DNR (pink MOST or yellow form) [place]     ?Does patient want to make changes to medical advance directive?     No - Patient declined    ?Copy of [place]in Chart? Yes - validated most recent copy scanned in chart (See row information)  No - copy requested Yes - validated most recent copy scanned in chart (See row information)     ?Would patient like information on creating a medical advance directive?      No - Patient declined No - Patient declined  ? ? ?Current Medications (verified) ?Outpatient Encounter Medications as of 08/18/2021  ?Medication Sig  ? ALPRAZolam (XANAX) 0.5 MG tablet TAKE 1 TABLET BY MOUTH AT BEDTIME AS NEEDED FOR ANXIETY  ? aspirin 81 MG EC tablet Take 81 mg by mouth daily. Swallow whole.  ? calcium carbonate (OS-CAL) 1250 (500 CA) MG chewable tablet Chew 1 tablet by mouth 2 (two) times daily.   ? Cholecalciferol (VITAMIN D3) 50 MCG (2000 UT) CHEW Chew 2,000 Units by mouth daily.   ? cycloSPORINE (RESTASIS) 0.05 % ophthalmic emulsion 1 drop 2 (two)  times daily.  ? denosumab (PROLIA) 60 MG/ML SOSY injection Inject 60 mg into the skin every 6 (six) months.  ? Estradiol (YUVAFEM) 10 MCG TABS vaginal tablet PLACE 1 TABLET VAGINALLY 2 TIMES A WEEK.  ? fluticasone-salmeterol (ADVAIR) 100-50 MCG/ACT AEPB Inhale 1 puff into the lungs at bedtime as needed.  ? hydrochlorothiazide (MICROZIDE) 12.5 MG capsule Take 1 capsule by mouth daily.  ? HYDROcodone-acetaminophen (NORCO) 7.5-325 MG tablet Take 1 tablet by mouth every 6 (six) hours as needed for moderate pain. For 06/2020  ? hydroxypropyl methylcellulose / hypromellose (ISOPTO TEARS / GONIOVISC) 2.5 % ophthalmic solution Place 1 drop into both eyes 3 (three) times daily as needed for dry eyes.  ? levothyroxine (SYNTHROID) 88 MCG tablet TAKE 1 TABLET BY MOUTH EVERY DAY BEFORE BREAKFAST  ? Multiple Vitamin (MULTIVITAMIN) capsule Take 1 capsule by mouth daily.    ? mupirocin ointment (BACTROBAN) 2 % Apply sparingly to toe nail bed bid  ? naloxone (NARCAN) 4 MG/0.1ML LIQD nasal spray kit Use as directed  ? pantoprazole (PROTONIX) 40 MG tablet Take 1 tablet (40 mg total) by mouth daily before breakfast.  ? pravastatin (PRAVACHOL) 20 MG tablet Take by mouth.  ? Probiotic Product (PROBIOTIC-10) CHEW Chew 1 tablet by mouth daily.  ? vitamin  E 1000 UNIT capsule vitamin E  ? ?No facility-administered encounter medications on file as of 08/18/2021.  ? ? ?Allergies (verified) ?Alendronate sodium, Bactrim [sulfamethoxazole-trimethoprim], Contrast media [iodinated contrast media], Iodine, Milnacipran, and Penicillins  ? ?History: ?Past Medical History:  ?Diagnosis Date  ? Arthritis   ? Cataract   ? both eyes rt. eye was removed  ? Chest pain   ? stress test @ [place] [facility] (-)  ? Colonic polyp   ? bx adenomatous polyps, next scope 2010  ? COPD (chronic obstructive pulmonary disease) ([place])   ? Cornea disorder   ? 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 Fibromyalgia   ? see rheumatology  ? GERD (gastroesophageal reflux disease)   ? PAST  HX   ? Hyperlipidemia   ? Hypothyroidism   ? Migraine   ? 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 on doxy  ? Osteoporosis   ? osteopenia  ? Pleuritic chest pain   ? etiology unclear, has beeb eval by pulmonary and rheumatology (autoimmune dz?)  ? Wrist fracture 2018  ? ?Past Surgical History:  ?Procedure Laterality Date  ? ANTERIOR LAT LUMBAR FUSION N/A 12/03/2019  ? Procedure: ANTERIOR LATERAL LUMBAR FUSION L2-4;  Surgeon: Melina Schools, MD;  Location: [facility];  Service: Orthopedics;  Laterality: N/A;  4 hrs  ? COLONOSCOPY    ? COSMETIC SURGERY  04/12/2018  ? L upper eyelid ptosis repair, and trichophytic brow lift  ? ESOPHAGOGASTRODUODENOSCOPY (EGD) WITH PROPOFOL N/A 03/27/2013  ? Procedure: ESOPHAGOGASTRODUODENOSCOPY (EGD) WITH PROPOFOL;  Surgeon: Milus Banister, MD;  Location: [facility];  Service: Endoscopy;  Laterality: N/A;  possible dil  ? EYE SURGERY    ? growth on cornea-bil.  ? plastic surgery face  2013  ? POLYPECTOMY    ? right rotator cuff repair  2010  ? TONSILLECTOMY    ? UPPER GASTROINTESTINAL ENDOSCOPY    ? ?Family History  ?Adopted: Yes  ?Problem Relation Age of Onset  ? Cancer Neg Hx   ? Depression Neg Hx   ? Diabetes Neg Hx   ? Stroke Neg Hx   ? Hypertension Neg Hx   ? Heart disease Neg Hx   ? ?Social History  ? ?Socioeconomic History  ? Marital status: Married  ?  Spouse name: Not on file  ? Number of children: 0  ? Years of education: Not on file  ? Highest education level: Not on file  ?Occupational History  ? Occupation: retired, Medical sales representative work  ?  Employer: RETIRED  ?Tobacco Use  ? Smoking status: Former  ?  Packs/day: 1.00  ?  Years: 38.50  ?  Pack years: 38.50  ?  Types: Cigarettes  ?  Quit date: 01/13/2004  ?  Years since quitting: 17.6  ? Smokeless tobacco: Never  ? Tobacco comments:  ?  quit 2005  ?Vaping Use  ? Vaping Use: Never used  ?Substance and Sexual Activity  ? Alcohol use: No  ?  Alcohol/week: 0.0 standard drinks  ?  Comment: rare  ? Drug use: No  ? Sexual activity: Not Currently  ?   Comment: first intercourse >16  ?Other Topics Concern  ? Not on file  ?Social History Narrative  ? No biological children  ?   ? ?Social Determinants of Health  ? ?Financial Resource Strain: Not on file  ?Food Insecurity: Not on file  ?Transportation Needs: Not on file  ?Physical Activity: Not on file  ?Stress: Not on file  ?Social Connections: Not on file  ? ? ?  Tobacco Counseling ?Counseling given: Not Answered ?Tobacco comments: quit 2005 ? ? ?Clinical Intake: ? ?Pre-visit preparation completed: Yes ? ?Pain : No/denies pain ? ?  ? ?Nutritional Risks: None ?Diabetes: No ? ?How often do you need to have someone help you when you read instructions, pamphlets, or other written materials from your doctor or pharmacy?: 1 - Never ? ?Diabetic?no ? ?Interpreter Needed?: No ? ?Information entered by :: Shaakira Chism ? ? ?Activities of Daily Living ? ?  08/18/2021  ?  8:32 AM  ?In your present state of health, do you have any difficulty performing the following activities:  ?Hearing? 0  ?Vision? 0  ?Difficulty concentrating or making decisions? 0  ?Walking or climbing stairs? 0  ?Dressing or bathing? 0  ?Doing errands, shopping? 0  ?Preparing Food and eating ? N  ?Using the Toilet? N  ?In the past six months, have you accidently leaked urine? N  ?Do you have problems with loss of bowel control? N  ?Managing your Medications? N  ?Managing your Finances? N  ?Housekeeping or managing your Housekeeping? N  ? ? ?Patient Care Team: ?Colon Branch, MD as PCP - General ?Josue Hector, MD as PCP - Cardiology (Cardiology) ?Nunzio Cobbs, MD as Consulting Physician (Obstetrics and Gynecology) ?Solon Augusta, MD as Attending Physician (Ophthalmology) ?Melina Schools, MD as Consulting Physician (Orthopedic Surgery) ? ?Indicate any recent Medical Services you may have received from other than [facility] providers in the past year (date may be approximate). ? ?   ?Assessment:  ? This is a routine wellness examination for  [facility]. ? ?Hearing/Vision screen ?No results found. ? ?Dietary issues and exercise activities discussed: ?Current Exercise Habits: Home exercise routine, Type of exercise: walking, Time (Minutes): 30, Frequency (Tim

## 2021-08-18 ENCOUNTER — Ambulatory Visit (INDEPENDENT_AMBULATORY_CARE_PROVIDER_SITE_OTHER): Payer: Medicare HMO

## 2021-08-18 VITALS — BP 104/71 | HR 64 | Temp 98.3°F | Resp 16 | Ht 64.0 in | Wt 186.8 lb

## 2021-08-18 DIAGNOSIS — Z0000 Encounter for general adult medical examination without abnormal findings: Secondary | ICD-10-CM

## 2021-08-18 NOTE — Patient Instructions (Signed)
Ms. Carolan , ?Thank you for taking time to come for your Medicare Wellness Visit. I appreciate your ongoing commitment to your health goals. Please review the following plan we discussed and let me know if I can assist you in the future.  ? ?Screening recommendations/referrals: ?Colonoscopy: 09/03/20 due 09/03/25 ?Mammogram: 03/11/21 due 03/11/22 ?Bone Density: 03/11/21 due 03/12/23 ?Recommended yearly ophthalmology/optometry visit for glaucoma screening and checkup ?Recommended yearly dental visit for hygiene and checkup ? ?Vaccinations: ?Influenza vaccine: up to date ?Pneumococcal vaccine: up to date ?Tdap vaccine: up to date ?Shingles vaccine: up to date   ?Covid-19:completed  ? ?Advanced directives: yes, on file ? ?Conditions/risks identified: see problem list ? ?Next appointment: Follow up in one year for your annual wellness visit  ? ? ?Preventive Care 76 Years and Older, Female ?Preventive care refers to lifestyle choices and visits with your health care provider that can promote health and wellness. ?What does preventive care include? ?A yearly physical exam. This is also called an annual well check. ?Dental exams once or twice a year. ?Routine eye exams. Ask your health care provider how often you should have your eyes checked. ?Personal lifestyle choices, including: ?Daily care of your teeth and gums. ?Regular physical activity. ?Eating a healthy diet. ?Avoiding tobacco and drug use. ?Limiting alcohol use. ?Practicing safe sex. ?Taking low-dose aspirin every day. ?Taking vitamin and mineral supplements as recommended by your health care provider. ?What happens during an annual well check? ?The services and screenings done by your health care provider during your annual well check will depend on your age, overall health, lifestyle risk factors, and family history of disease. ?Counseling  ?Your health care provider may ask you questions about your: ?Alcohol use. ?Tobacco use. ?Drug use. ?Emotional well-being. ?Home  and relationship well-being. ?Sexual activity. ?Eating habits. ?History of falls. ?Memory and ability to understand (cognition). ?Work and work Statistician. ?Reproductive health. ?Screening  ?You may have the following tests or measurements: ?Height, weight, and BMI. ?Blood pressure. ?Lipid and cholesterol levels. These may be checked every 5 years, or more frequently if you are over 76 years old. ?Skin check. ?Lung cancer screening. You may have this screening every year starting at age 44 if you have a 30-pack-year history of smoking and currently smoke or have quit within the past 15 years. ?Fecal occult blood test (FOBT) of the stool. You may have this test every year starting at age 76. ?Flexible sigmoidoscopy or colonoscopy. You may have a sigmoidoscopy every 5 years or a colonoscopy every 10 years starting at age 40. ?Hepatitis C blood test. ?Hepatitis B blood test. ?Sexually transmitted disease (STD) testing. ?Diabetes screening. This is done by checking your blood sugar (glucose) after you have not eaten for a while (fasting). You may have this done every 1-3 years. ?Bone density scan. This is done to screen for osteoporosis. You may have this done starting at age 30. ?Mammogram. This may be done every 1-2 years. Talk to your health care provider about how often you should have regular mammograms. ?Talk with your health care provider about your test results, treatment options, and if necessary, the need for more tests. ?Vaccines  ?Your health care provider may recommend certain vaccines, such as: ?Influenza vaccine. This is recommended every year. ?Tetanus, diphtheria, and acellular pertussis (Tdap, Td) vaccine. You may need a Td booster every 10 years. ?Zoster vaccine. You may need this after age 49. ?Pneumococcal 13-valent conjugate (PCV13) vaccine. One dose is recommended after age 76. ?Pneumococcal polysaccharide (PPSV23) vaccine.  One dose is recommended after age 53. ?Talk to your health care provider  about which screenings and vaccines you need and how often you need them. ?This information is not intended to replace advice given to you by your health care provider. Make sure you discuss any questions you have with your health care provider. ?Document Released: 04/23/2015 Document Revised: 12/15/2015 Document Reviewed: 01/26/2015 ?Elsevier Interactive Patient Education ? 2017 [place]. ? ?Fall Prevention in the Home ?Falls can cause injuries. They can happen to people of all ages. There are many things you can do to make your home safe and to help prevent falls. ?What can I do on the outside of my home? ?Regularly fix the edges of walkways and driveways and fix any cracks. ?Remove anything that might make you trip as you walk through a door, such as a raised step or threshold. ?Trim any bushes or trees on the path to your home. ?Use bright outdoor lighting. ?Clear any walking paths of anything that might make someone trip, such as rocks or tools. ?Regularly check to see if handrails are loose or broken. Make sure that both sides of any steps have handrails. ?Any raised decks and porches should have guardrails on the edges. ?Have any leaves, snow, or ice cleared regularly. ?Use sand or salt on walking paths during winter. ?Clean up any spills in your garage right away. This includes oil or grease spills. ?What can I do in the bathroom? ?Use night lights. ?Install grab bars by the toilet and in the tub and shower. Do not use towel bars as grab bars. ?Use non-skid mats or decals in the tub or shower. ?If you need to sit down in the shower, use a plastic, non-slip stool. ?Keep the floor dry. Clean up any water that spills on the floor as soon as it happens. ?Remove soap buildup in the tub or shower regularly. ?Attach bath mats securely with double-sided non-slip rug tape. ?Do not have throw rugs and other things on the floor that can make you trip. ?What can I do in the bedroom? ?Use night lights. ?Make sure  that you have a light by your bed that is easy to reach. ?Do not use any sheets or blankets that are too big for your bed. They should not hang down onto the floor. ?Have a firm chair that has side arms. You can use this for support while you get dressed. ?Do not have throw rugs and other things on the floor that can make you trip. ?What can I do in the kitchen? ?Clean up any spills right away. ?Avoid walking on wet floors. ?Keep items that you use a lot in easy-to-reach places. ?If you need to reach something above you, use a strong step stool that has a grab bar. ?Keep electrical cords out of the way. ?Do not use floor polish or wax that makes floors slippery. If you must use wax, use non-skid floor wax. ?Do not have throw rugs and other things on the floor that can make you trip. ?What can I do with my stairs? ?Do not leave any items on the stairs. ?Make sure that there are handrails on both sides of the stairs and use them. Fix handrails that are broken or loose. Make sure that handrails are as long as the stairways. ?Check any carpeting to make sure that it is firmly attached to the stairs. Fix any carpet that is loose or worn. ?Avoid having throw rugs at the top or bottom of  the stairs. If you do have throw rugs, attach them to the floor with carpet tape. ?Make sure that you have a light switch at the top of the stairs and the bottom of the stairs. If you do not have them, ask someone to add them for you. ?What else can I do to help prevent falls? ?Wear shoes that: ?Do not have high heels. ?Have rubber bottoms. ?Are comfortable and fit you well. ?Are closed at the toe. Do not wear sandals. ?If you use a stepladder: ?Make sure that it is fully opened. Do not climb a closed stepladder. ?Make sure that both sides of the stepladder are locked into place. ?Ask someone to hold it for you, if possible. ?Clearly mark and make sure that you can see: ?Any grab bars or handrails. ?First and last steps. ?Where the edge of  each step is. ?Use tools that help you move around (mobility aids) if they are needed. These include: ?Canes. ?Walkers. ?Scooters. ?Crutches. ?Turn on the lights when you go into a dark area. Replace an

## 2021-08-19 ENCOUNTER — Encounter (HOSPITAL_BASED_OUTPATIENT_CLINIC_OR_DEPARTMENT_OTHER): Payer: Self-pay | Admitting: Physical Therapy

## 2021-08-19 ENCOUNTER — Ambulatory Visit (HOSPITAL_BASED_OUTPATIENT_CLINIC_OR_DEPARTMENT_OTHER): Payer: Medicare HMO | Attending: Physical Medicine & Rehabilitation | Admitting: Physical Therapy

## 2021-08-19 DIAGNOSIS — R262 Difficulty in walking, not elsewhere classified: Secondary | ICD-10-CM | POA: Diagnosis not present

## 2021-08-19 DIAGNOSIS — M47816 Spondylosis without myelopathy or radiculopathy, lumbar region: Secondary | ICD-10-CM | POA: Diagnosis not present

## 2021-08-19 DIAGNOSIS — M961 Postlaminectomy syndrome, not elsewhere classified: Secondary | ICD-10-CM | POA: Insufficient documentation

## 2021-08-19 DIAGNOSIS — M6281 Muscle weakness (generalized): Secondary | ICD-10-CM | POA: Diagnosis not present

## 2021-08-19 DIAGNOSIS — M5459 Other low back pain: Secondary | ICD-10-CM | POA: Diagnosis not present

## 2021-08-19 NOTE — Therapy (Signed)
?OUTPATIENT PHYSICAL THERAPY THORACOLUMBAR EVALUATION ? ? ?Patient Name: [name] ?MRN: [id number] ?DOB:02-Apr-1946, 76 y.o., female ?Today's Date: 08/19/2021 ? ? PT End of Session - 08/19/21 1157   ? ? Visit Number 1   ? Number of Visits 18   ? Date for PT Re-Evaluation 11/17/21   ? Authorization Type [employer] MCR   ? PT Start Time 1145   ? PT Stop Time 1230   ? PT Time Calculation (min) 45 min   ? Activity Tolerance Patient tolerated treatment well;Patient limited by pain   ? Behavior During Therapy [facility] for tasks assessed/performed   ? ?  ?  ? ?  ? ? ?Past Medical History:  ?Diagnosis Date  ? Arthritis   ? Cataract   ? both eyes rt. eye was removed  ? Chest pain   ? stress test @ [place] [facility] (-)  ? Colonic polyp   ? bx adenomatous polyps, next scope 2010  ? COPD (chronic obstructive pulmonary disease) ([place])   ? Cornea disorder   ? 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 Fibromyalgia   ? see rheumatology  ? GERD (gastroesophageal reflux disease)   ? PAST HX   ? Hyperlipidemia   ? Hypothyroidism   ? Migraine   ? 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 on doxy  ? Osteoporosis   ? osteopenia  ? Pleuritic chest pain   ? etiology unclear, has beeb eval by pulmonary and rheumatology (autoimmune dz?)  ? Wrist fracture 2018  ? ?Past Surgical History:  ?Procedure Laterality Date  ? ANTERIOR LAT LUMBAR FUSION N/A 12/03/2019  ? Procedure: ANTERIOR LATERAL LUMBAR FUSION L2-4;  Surgeon: Melina Schools, MD;  Location: [facility];  Service: Orthopedics;  Laterality: N/A;  4 hrs  ? COLONOSCOPY    ? COSMETIC SURGERY  04/12/2018  ? L upper eyelid ptosis repair, and trichophytic brow lift  ? ESOPHAGOGASTRODUODENOSCOPY (EGD) WITH PROPOFOL N/A 03/27/2013  ? Procedure: ESOPHAGOGASTRODUODENOSCOPY (EGD) WITH PROPOFOL;  Surgeon: Milus Banister, MD;  Location: [facility];  Service: Endoscopy;  Laterality: N/A;  possible dil  ? EYE SURGERY    ? growth on cornea-bil.  ? plastic surgery face  2013  ? POLYPECTOMY    ? right rotator cuff repair  2010  ?  TONSILLECTOMY    ? UPPER GASTROINTESTINAL ENDOSCOPY    ? ?Patient Active Problem List  ? Diagnosis Date Noted  ? Fusion of lumbar spine 12/03/2019  ? COPD (chronic obstructive pulmonary disease) ([place]) 08/22/2019  ? Spinal stenosis of lumbar region with neurogenic claudication 11/22/2018  ? Coronary artery disease due to lipid rich plaque 10/03/2018  ? Closed fracture of distal end of radius 04/16/2017  ? Chronic back pain 03/15/2017  ? PCP NOTES >>>>>>>>>>>>>>>>>>>> 05/24/2015  ? Numbness and tingling of right arm 05/22/2015  ? Insomnia 09/01/2014  ? GERD (gastroesophageal reflux disease) 03/27/2013  ? Dysphagia, pharyngoesophageal phase 03/27/2013  ? Annual physical exam 06/12/2011  ? Sinusitis, chronic 02/02/2011  ? Osteoporosis 06/08/2010  ? Fibromyalgia, pain mngmt 01/29/2009  ? LIPOMA 09/30/2008  ? Migraine without aura 03/04/2007  ? Hypothyroidism 05/15/2006  ? COLONIC POLYPS, HX OF 05/15/2006  ? ? ?PCP:  Kathlene November, MD ? ?REFERRING PROVIDER:  Alysia Penna, MD ? ?REFERRING DIAG:  ?M47.816 (ICD-10-CM) - Spondylosis without myelopathy or radiculopathy, lumbar region  ?M96.1 (ICD-10-CM) - Lumbar post-laminectomy syndrome  ? ? ?THERAPY DIAG:  ?Other low back pain ? ?Muscle weakness (generalized) ? ?Difficulty walking ? ?ONSET DATE: March 2023 ? ? ?SUBJECTIVE:                                                                                                                                                                                          ? ?  SUBJECTIVE STATEMENT: ? ?Pt states that the pain has gotten worse since last time. The pain is more constant than before. Sleeping is the worst now. She is now getting shooting pains down the leg mostly when sleeping on back. Pt has NT as well down to the big toe. She states it waxes and wanes.  Pt has gotten an epidural for her recent back pain. Her new MRI shows significant degenerative changes as well as stenosis. Pt states she has stopped previous exercises due to  increase in pain. Pt is only waking around the house or in the yard mainly for exercise.  ? ?Pt states she does notice a change in balance when she goes to turn an pivot that has gotten worse.  ? ?Previous Episode:  ? ?Pt has R sided hip and thigh pain and weakness.  Pt describes pain as achey and deep within the hip. Pain does not cross knee. Current 4/10, Best 0/10, Worst 9/10. Pt states the pain started about 6 months ago. She went to PT at [facility] from Oct to Dec in 2021 and did not get better. She stated it started to decline. She then went to [facility] for PT again. She reports it was still getting worse and got a steroid injection. She states she is here for aquatic therapy. Pt denies NT into the R LE. Aggs: stairs, pivoting, sleeping on L side, R LE WB, getting in and out of cars, walking without support, sitting too long; Eases: sitting/resting, ice, meds. Denies pain with cough, sneezing, laughing. Pt states she sits for most of the day ~ 8 hrs. Pt does currently walk for 15 mins a day currently for exercise. Pt states she is not doing previous PT exercises. Pt denies unexpected weight loss but will have night pain after increased activity.  ? ?PERTINENT HISTORY:  ?wrist fx, pleuritic chest pain, osteoporosis, migraine, hypothyroidism, glaucoma, fibromyalgia, R RTC repair, anterior lateral lumbar fusion L2-12 Nov 2019  ? ?PAIN:  ?Are you having pain? Yes: NPRS scale: 2/10 ?Pain location: R  hip, L/S, R SIJ ?Pain description: tightness, strained muscle, pulled muscle ?Aggravating factors: bending, lifting, stairs, sleeping, sitting, standing too long, tighter pants ?Relieving factors: ice, stretching, pain pill ? ? ?PRECAUTIONS: None ? ?WEIGHT BEARING RESTRICTIONS No ? ?FALLS:  ?Has patient fallen in last 6 months? No ? ?LIVING ENVIRONMENT: ?Lives with: lives with their family and lives with their spouse ?Lives in: House/apartment ?Stairs: Yes 2 story home ?Has following equipment at home: None ? ?OCCUPATION:  retired ? ?PLOF: Independent with basic ADLs ? ?PATIENT GOALS : Pt states she would like to be able to reduce constant pain ? ? ?OBJECTIVE:  ? ?DIAGNOSTIC FINDINGS:  ?IMPRESSION: ?1. Interval L2-3 and L3-4 PLIF with probable solid arthrodesis. No ?recurrent impingement. There has been progression of degenerative ?disease at the remaining open lumbar levels. Mild discogenic edema ?at L1-2 and L5-S1. ?2. L1-2 high-grade spinal stenosis and moderate left foraminal ?narrowing. ?3. L4-5 high-grade spinal stenosis. ?4. L5-S1 right more than left subarticular recess narrowing. ?Moderate bilateral foraminal narrowing. ? ?PATIENT SURVEYS:  ?FOTO 45 ?49 at DC ? ?SCREENING FOR RED FLAGS: ?Bowel or bladder incontinence: No ?Spinal tumors: No ?Cauda equina syndrome: No ?Compression fracture: No ? ? ?COGNITION: ? Overall cognitive status: Within functional limits for tasks assessed   ?  ?SENSATION: ?Light touch: Impaired  ? ? ?POSTURE:  ?Fwd flexed, mild kyphosis, decreased lumbar lordosis ? ?PALPATION: ?Hypertonicity of bilat L/S paraspinals with TTP to L3-5 R paraspinals and  QL ? ?LUMBAR ROM:  ? ?Active  A/PROM  ?08/19/2021  ?Flexion 75%  ?Extension 25% p!  ?Right lateral flexion 50%p1  ?Left lateral flexion 50%  ?Right rotation 50%  ?Left rotation 50%  ? (Blank rows = not tested) ? ?LE ROM: moderately limited in all planes with seated marching  & cross/uncrossing due to hip and L/S stiffness/pain ? ?LE MMT:  myotomal weakness of R LE noted at hip, knee, and ankle eversion, 4/5 quickly fatiguing ? ?MMT Right ?08/19/2021 Left ?08/19/2021  ?Hip flexion myotomal weakness 4+/5  ?Hip extension    ?Hip abduction myotomal weakness 4+/5  ?Hip adduction myotomal weakness 4+/5  ?Knee flexion myotomal weakness 4+/5  ?Knee extension myotomal weakness 4+/5  ?Ankle dorsiflexion 4+/5 4+/5  ?Ankle eversion myotomal weakness 4+/5  ? (Blank rows = not tested) ? ?LUMBAR SPECIAL TESTS:  ?Straight leg raise test: Positive, Slump test: Positive, and  FABER test: Positive ? ?FUNCTIONAL TESTS:  ?5 times sit to stand: 19.1s ? ? ?GAIT: ?Distance walked: 48f ?Assistive device utilized: None ?Level of assistance: Complete Independence ?Comments: decreased step leng

## 2021-08-24 ENCOUNTER — Encounter (HOSPITAL_BASED_OUTPATIENT_CLINIC_OR_DEPARTMENT_OTHER): Payer: Self-pay | Admitting: Physical Therapy

## 2021-08-24 ENCOUNTER — Ambulatory Visit (HOSPITAL_BASED_OUTPATIENT_CLINIC_OR_DEPARTMENT_OTHER): Payer: Medicare HMO | Admitting: Physical Therapy

## 2021-08-24 DIAGNOSIS — M5459 Other low back pain: Secondary | ICD-10-CM

## 2021-08-24 DIAGNOSIS — R262 Difficulty in walking, not elsewhere classified: Secondary | ICD-10-CM | POA: Diagnosis not present

## 2021-08-24 DIAGNOSIS — M6281 Muscle weakness (generalized): Secondary | ICD-10-CM | POA: Diagnosis not present

## 2021-08-24 DIAGNOSIS — M961 Postlaminectomy syndrome, not elsewhere classified: Secondary | ICD-10-CM | POA: Diagnosis not present

## 2021-08-24 DIAGNOSIS — M47816 Spondylosis without myelopathy or radiculopathy, lumbar region: Secondary | ICD-10-CM | POA: Diagnosis not present

## 2021-08-24 NOTE — Therapy (Signed)
?OUTPATIENT PHYSICAL THERAPY THORACOLUMBAR EVALUATION ? ? ?Patient Name: [name] ?MRN: [id number] ?DOB:[birth year], 76 y.o., female ?Today's Date: 08/24/2021 ? ? ? ? ?Past Medical History:  ?Diagnosis Date  ? Arthritis   ? Cataract   ? both eyes rt. eye was removed  ? Chest pain   ? stress test @ [place] [facility] (-)  ? Colonic polyp   ? bx adenomatous polyps, next scope 2010  ? COPD (chronic obstructive pulmonary disease) ([place])   ? Cornea disorder   ? 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 Fibromyalgia   ? see rheumatology  ? GERD (gastroesophageal reflux disease)   ? PAST HX   ? Hyperlipidemia   ? Hypothyroidism   ? Migraine   ? 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 on doxy  ? Osteoporosis   ? osteopenia  ? Pleuritic chest pain   ? etiology unclear, has beeb eval by pulmonary and rheumatology (autoimmune dz?)  ? Wrist fracture 2018  ? ?Past Surgical History:  ?Procedure Laterality Date  ? ANTERIOR LAT LUMBAR FUSION N/A 12/03/2019  ? Procedure: ANTERIOR LATERAL LUMBAR FUSION L2-4;  Surgeon: Melina Schools, MD;  Location: [facility];  Service: Orthopedics;  Laterality: N/A;  4 hrs  ? COLONOSCOPY    ? COSMETIC SURGERY  04/12/2018  ? L upper eyelid ptosis repair, and trichophytic brow lift  ? ESOPHAGOGASTRODUODENOSCOPY (EGD) WITH PROPOFOL N/A 03/27/2013  ? Procedure: ESOPHAGOGASTRODUODENOSCOPY (EGD) WITH PROPOFOL;  Surgeon: Milus Banister, MD;  Location: [facility];  Service: Endoscopy;  Laterality: N/A;  possible dil  ? EYE SURGERY    ? growth on cornea-bil.  ? plastic surgery face  2013  ? POLYPECTOMY    ? right rotator cuff repair  2010  ? TONSILLECTOMY    ? UPPER GASTROINTESTINAL ENDOSCOPY    ? ?Patient Active Problem List  ? Diagnosis Date Noted  ? Fusion of lumbar spine 12/03/2019  ? COPD (chronic obstructive pulmonary disease) ([place]) 08/22/2019  ? Spinal stenosis of lumbar region with neurogenic claudication 11/22/2018  ? Coronary artery disease due to lipid rich plaque 10/03/2018  ? Closed fracture of distal end of radius  04/16/2017  ? Chronic back pain 03/15/2017  ? PCP NOTES >>>>>>>>>>>>>>>>>>>> 05/24/2015  ? Numbness and tingling of right arm 05/22/2015  ? Insomnia 09/01/2014  ? GERD (gastroesophageal reflux disease) 03/27/2013  ? Dysphagia, pharyngoesophageal phase 03/27/2013  ? Annual physical exam 06/12/2011  ? Sinusitis, chronic 02/02/2011  ? Osteoporosis 06/08/2010  ? Fibromyalgia, pain mngmt 01/29/2009  ? LIPOMA 09/30/2008  ? Migraine without aura 03/04/2007  ? Hypothyroidism 05/15/2006  ? COLONIC POLYPS, HX OF 05/15/2006  ? ? ?PCP:  Kathlene November, MD ? ?REFERRING PROVIDER:  Alysia Penna, MD ? ?REFERRING DIAG:  ?M47.816 (ICD-10-CM) - Spondylosis without myelopathy or radiculopathy, lumbar region  ?M96.1 (ICD-10-CM) - Lumbar post-laminectomy syndrome  ? ? ?THERAPY DIAG:  ?No diagnosis found. ? ?ONSET DATE: March 2023 ? ? ?SUBJECTIVE:                                                                                                                                                                                          ? ?  SUBJECTIVE STATEMENT: ? ?Pt states that the pain has gotten worse since last time. The pain is more constant than before. Sleeping is the worst now. She is now getting shooting pains down the leg mostly when sleeping on back. Pt has NT as well down to the big toe. She states it waxes and wanes.  Pt has gotten an epidural for her recent back pain. Her new MRI shows significant degenerative changes as well as stenosis. Pt states she has stopped previous exercises due to increase in pain. Pt is only waking around the house or in the yard mainly for exercise.  ? ?Pt states she does notice a change in balance when she goes to turn an pivot that has gotten worse.  ? ?Previous Episode:  ? ?Pt has R sided hip and thigh pain and weakness.  Pt describes pain as achey and deep within the hip. Pain does not cross knee. Current 4/10, Best 0/10, Worst 9/10. Pt states the pain started about 6 months ago. She went to PT at  [facility] from Oct to Dec in 2021 and did not get better. She stated it started to decline. She then went to [place] for PT again. She reports it was still getting worse and got a steroid injection. She states she is here for aquatic therapy. Pt denies NT into the R LE. Aggs: stairs, pivoting, sleeping on L side, R LE WB, getting in and out of cars, walking without support, sitting too long; Eases: sitting/resting, ice, meds. Denies pain with cough, sneezing, laughing. Pt states she sits for most of the day ~ 8 hrs. Pt does currently walk for 15 mins a day currently for exercise. Pt states she is not doing previous PT exercises. Pt denies unexpected weight loss but will have night pain after increased activity.  ? ?PERTINENT HISTORY:  ?wrist fx, pleuritic chest pain, osteoporosis, migraine, hypothyroidism, glaucoma, fibromyalgia, R RTC repair, anterior lateral lumbar fusion L2-12 Nov 2019  ? ?PAIN:  ?Are you having pain? Yes: NPRS scale: 2/10 ?Pain location: R  hip, L/S, R SIJ ?Pain description: tightness, strained muscle, pulled muscle ?Aggravating factors: bending, lifting, stairs, sleeping, sitting, standing too long, tighter pants ?Relieving factors: ice, stretching, pain pill ? ? ?PRECAUTIONS: None ? ?WEIGHT BEARING RESTRICTIONS No ? ?FALLS:  ?Has patient fallen in last 6 months? No ? ?LIVING ENVIRONMENT: ?Lives with: lives with their family and lives with their spouse ?Lives in: House/apartment ?Stairs: Yes 2 story home ?Has following equipment at home: None ? ?OCCUPATION: retired ? ?PLOF: Independent with basic ADLs ? ?PATIENT GOALS : Pt states she would like to be able to reduce constant pain ? ? ?OBJECTIVE:  ? ?DIAGNOSTIC FINDINGS:  ?IMPRESSION: ?1. Interval L2-3 and L3-4 PLIF with probable solid arthrodesis. No ?recurrent impingement. There has been progression of degenerative ?disease at the remaining open lumbar levels. Mild discogenic edema ?at L1-2 and L5-S1. ?2. L1-2 high-grade spinal stenosis and  moderate left foraminal ?narrowing. ?3. L4-5 high-grade spinal stenosis. ?4. L5-S1 right more than left subarticular recess narrowing. ?Moderate bilateral foraminal narrowing. ? ?PATIENT SURVEYS:  ?FOTO 45 ?50 at DC ? ?SCREENING FOR RED FLAGS: ?Bowel or bladder incontinence: No ?Spinal tumors: No ?Cauda equina syndrome: No ?Compression fracture: No ? ? ?COGNITION: ? Overall cognitive status: Within functional limits for tasks assessed   ?  ?SENSATION: ?Light touch: Impaired  ? ? ?POSTURE:  ?Fwd flexed, mild kyphosis, decreased lumbar lordosis ? ?PALPATION: ?Hypertonicity of bilat L/S paraspinals with TTP to L3-5 R paraspinals and  QL ? ?LUMBAR ROM:  ? ?Active  A/PROM  ?08/19/2021  ?Flexion 75%  ?Extension 25% p!  ?Right lateral flexion 50%p1  ?Left lateral flexion 50%  ?Right rotation 50%  ?Left rotation 50%  ? (Blank rows = not tested) ? ?LE ROM: moderately limited in all planes with seated marching  & cross/uncrossing due to hip and L/S stiffness/pain ? ?LE MMT:  myotomal weakness of R LE noted at hip, knee, and ankle eversion, 4/5 quickly fatiguing ? ?MMT Right ?08/19/2021 Left ?08/19/2021  ?Hip flexion myotomal weakness 4+/5  ?Hip extension    ?Hip abduction myotomal weakness 4+/5  ?Hip adduction myotomal weakness 4+/5  ?Knee flexion myotomal weakness 4+/5  ?Knee extension myotomal weakness 4+/5  ?Ankle dorsiflexion 4+/5 4+/5  ?Ankle eversion myotomal weakness 4+/5  ? (Blank rows = not tested) ? ?LUMBAR SPECIAL TESTS:  ?Straight leg raise test: Positive, Slump test: Positive, and FABER test: Positive ? ?FUNCTIONAL TESTS:  ?5 times sit to stand: 19.1s ? ? ?GAIT: ?Distance walked: 9f ?Assistive device utilized: None ?Level of assistance: Complete Independence ?Comments: decreased step length, forward trunk flexion, mild kyphosis ? ? ? ?TODAY'S TREATMENT  ?5/17 ? ?Manual: trigger point release to lumbar spine; reviewed trigger points and how she can work on them using the thera-cane at home.  ? ?LTR x15  ?Gluteal  stretch in supine 2x20 sec hold right  ? ?Supine March  ? ?Eval:  ?Exercises ?- Seated Flexion Stretch with Swiss Minjares  - 2 x daily - 7 x weekly - 1 sets - 10 reps - 10 hold ?- Supine Posterior Pelvic Tilt  - 2 x dai

## 2021-08-26 NOTE — Progress Notes (Signed)
08/29/2021 ABAGAYLE KLUTTS [id number] Oct 20, 1945  Referring provider: Colon Branch, MD Primary GI doctor: Dr. Ardis Hughs  ASSESSMENT AND PLAN:   Dysphagia, unspecified type EGD to evaluate for structural abnormality, tumor, erosive/infectious esophagititis, and EOE.   If the EGD is negative can then proceed to barium swallow or MBS with some issues with liquids.  Can consider GES study but no nausea/fullness Declines medication at this time I discussed risks of EGD with patient today, including risk of sedation, bleeding or perforation.  Patient provides understanding and gave verbal consent to proceed.  Laryngopharyngeal reflux (LPR) Will start the patient on a PPI, will schedule for EGD Lifestyle changes discussed, avoid NSAIDS, ETOH  Constipation due to opioid therapy - Increase fiber/ water intake, decrease caffeine, increase activity level. -Will add on Miralax daily and Benefiber, if not helping can try amitza - Please go to the hospital if you have severe abdominal pain, vomiting, fever, CP, SOB.   Diverticulosis of colon without hemorrhage Will call if any symptoms. Add on fiber supplement, avoid NSAIDS, information given  History of adenomatous polyp of colon 09/03/2020 colonoscopy personal history of adenomatous polyps good prep 3 mm adenomatous polyp in ascending colon, diverticulosis otherwise normal 5 year recall   History of Present Illness:  76 y.o. female  with a past medical history of vitamin D deficiency, osteoporosis, hypothyroidism, hyperlipidemia, reflux, COPD, and others listed below, returns to clinic today for evaluation of dysphagia, dyspepsia. 09/03/2020 colonoscopy personal history of adenomatous polyps good prep 3 mm adenomatous polyp in ascending colon, diverticulosis otherwise normal 5 year recall (2027) 03/27/2013 upper endoscopy for GERD and intermittent dysphagia completely normal  She has been having trouble swallowing for less than a year, has  it intermittent with pills and food but has been increasing lately.  Has cough, hoarseness, throat clearing, denies GERD.  Occ has liquid going down the wrong pipe.  No fullness/nausea/vomiting.  No ETOH.  She is on hydrocodone 384m QID for 10 years, no NSAIDS.  She did not have BM for 1 week, took dulcolax and had a lot of cramping, had the BM eventually.  She has mid AB pain that comes and goes, on miralax every other day with fiber capsules. Right upper AB has pain with straining.  No family history of GB issues, adopted.   Current Medications:   Current Outpatient Medications (Endocrine & Metabolic):    denosumab (PROLIA) 60 MG/ML SOSY injection, Inject 60 mg into the skin every 6 (six) months.   levothyroxine (SYNTHROID) 88 MCG tablet, TAKE 1 TABLET BY MOUTH EVERY DAY BEFORE BREAKFAST  Current Outpatient Medications (Cardiovascular):    hydrochlorothiazide (MICROZIDE) 12.5 MG capsule, Take 1 capsule by mouth daily.   pravastatin (PRAVACHOL) 20 MG tablet, Take by mouth.  Current Outpatient Medications (Respiratory):    fluticasone-salmeterol (ADVAIR) 100-50 MCG/ACT AEPB, Inhale 1 puff into the lungs at bedtime as needed.  Current Outpatient Medications (Analgesics):    aspirin 81 MG EC tablet, Take 81 mg by mouth daily. Swallow whole.   HYDROcodone-acetaminophen (NORCO) 7.5-325 MG tablet, Take 1 tablet by mouth every 6 (six) hours as needed for moderate pain.   Current Outpatient Medications (Other):    ALPRAZolam (XANAX) 0.5 MG tablet, TAKE 1 TABLET BY MOUTH AT BEDTIME AS NEEDED FOR ANXIETY   calcium carbonate (OS-CAL) 1250 (500 CA) MG chewable tablet, Chew 1 tablet by mouth 2 (two) times daily.    Cholecalciferol (VITAMIN D3) 50 MCG (2000 UT) CHEW, Chew 2,000 Units by mouth  daily.    cycloSPORINE (RESTASIS) 0.05 % ophthalmic emulsion, 1 drop 2 (two) times daily.   Estradiol (YUVAFEM) 10 MCG TABS vaginal tablet, PLACE 1 TABLET VAGINALLY 2 TIMES A WEEK.   hydroxypropyl  methylcellulose / hypromellose (ISOPTO TEARS / GONIOVISC) 2.5 % ophthalmic solution, Place 1 drop into both eyes 3 (three) times daily as needed for dry eyes.   Multiple Vitamin (MULTIVITAMIN) capsule, Take 1 capsule by mouth daily.     naloxone (NARCAN) 4 MG/0.1ML LIQD nasal spray kit, Use as directed   Probiotic Product (PROBIOTIC-10) CHEW, Chew 1 tablet by mouth daily.   vitamin E 1000 UNIT capsule, vitamin E  Surgical History:  She  has a past surgical history that includes Tonsillectomy; right rotator cuff repair (2010); Eye surgery; Esophagogastroduodenoscopy (egd) with propofol (N/A, 03/27/2013); plastic surgery face (2013); Polypectomy; Cosmetic surgery (04/12/2018); Anterior lat lumbar fusion (N/A, 12/03/2019); Colonoscopy; and Upper gastrointestinal endoscopy. Family History:  Her family history is not on file. She was adopted. Social History:   reports that she quit smoking about 17 years ago. Her smoking use included cigarettes. She has a 38.50 pack-year smoking history. She has never used smokeless tobacco. She reports that she does not drink alcohol and does not use drugs.  Current Medications, Allergies, Past Medical History, Past Surgical History, Family History and Social History were reviewed in [employer] record.  Physical Exam: BP 124/60   Ht _0  (1.626 m)   Wt 187 lb (84.8 kg)   LMP 04/10/1998 (Approximate)   BMI 32.10 kg/m  General:   Pleasant, well developed female in no acute distress Heart : Regular rate and rhythm; no murmurs Pulm: Clear anteriorly; no wheezing Abdomen:  Soft, Obese AB, Sluggish bowel sounds. mild tenderness in the epigastrium. Without guarding and Without rebound, No organomegaly appreciated. Rectal: Not evaluated Extremities:  without  edema. Neurologic:  Alert and  oriented x4;  No focal deficits.  Psych:  Cooperative. Normal mood and affect.   Vladimir Crofts, PA-C 08/29/21

## 2021-08-28 ENCOUNTER — Telehealth: Payer: Self-pay | Admitting: Internal Medicine

## 2021-08-29 ENCOUNTER — Other Ambulatory Visit (HOSPITAL_BASED_OUTPATIENT_CLINIC_OR_DEPARTMENT_OTHER): Payer: Self-pay

## 2021-08-29 ENCOUNTER — Ambulatory Visit: Payer: Medicare HMO | Admitting: Physician Assistant

## 2021-08-29 ENCOUNTER — Encounter: Payer: Self-pay | Admitting: Physician Assistant

## 2021-08-29 VITALS — BP 124/60 | Ht 64.0 in | Wt 187.0 lb

## 2021-08-29 DIAGNOSIS — K5903 Drug induced constipation: Secondary | ICD-10-CM

## 2021-08-29 DIAGNOSIS — Z86010 Personal history of colonic polyps: Secondary | ICD-10-CM | POA: Diagnosis not present

## 2021-08-29 DIAGNOSIS — K219 Gastro-esophageal reflux disease without esophagitis: Secondary | ICD-10-CM | POA: Diagnosis not present

## 2021-08-29 DIAGNOSIS — K5730 Diverticulosis of large intestine without perforation or abscess without bleeding: Secondary | ICD-10-CM

## 2021-08-29 DIAGNOSIS — R1310 Dysphagia, unspecified: Secondary | ICD-10-CM | POA: Diagnosis not present

## 2021-08-29 MED ORDER — HYDROCODONE-ACETAMINOPHEN 7.5-325 MG PO TABS
1.0000 | ORAL_TABLET | Freq: Four times a day (QID) | ORAL | 0 refills | Status: DC | PRN
  Filled 2021-08-29: qty 120, 30d supply, fill #0

## 2021-08-29 NOTE — Telephone Encounter (Signed)
Pdmp ok, rx sent  ? ?

## 2021-08-29 NOTE — Patient Instructions (Addendum)
You have been scheduled for an endoscopy. Please follow written instructions given to you at your visit today. If you use inhalers (even only as needed), please bring them with you on the day of your procedure.   Miralax is an osmotic laxative.  It only brings more water into the stool.  This is safe to take daily.  Can take up to 17 gram of miralax twice a day.  Mix with juice or coffee.  Start 1 capful at night for 3-4 days and reassess your response in 3-4 days.  You can increase and decrease the dose based on your response.  Remember, it can take up to 3-4 days to take effect OR for the effects to wear off.   I often pair this with benefiber in the morning to help assure the stool is not too loose.    Silent reflux: Not all heartburn burns...Marland KitchenMarland KitchenMarland Kitchen  What is LPR? Laryngopharyngeal reflux (LPR) or silent reflux is a condition in which acid that is made in the stomach travels up the esophagus (swallowing tube) and gets to the throat. Not everyone with reflux has a lot of heartburn or indigestion. In fact, many people with LPR never have heartburn. This is why LPR is called SILENT REFLUX, and the terms "Silent reflux" and "LPR" are often used interchangeably. Because LPR is silent, it is sometimes difficult to diagnose.  How can you tell if you have LPR?  Chronic hoarseness- Some people have hoarseness that comes and goes throat clearing  Cough It can cause shortness of breath and cause asthma like symptoms. a feeling of a lump in the throat  difficulty swallowing a problem with too much nose and throat drainage.  Some people will feel their esophagus spasm which feels like their heart beating hard and fast, this will usually be after a meal, at rest, or lying down at night.    How do I treat this? Treatment for LPR should be individualized, and your doctor will suggest the best treatment for you. Generally there are several treatments for LPR: changing habits and diet to reduce  reflux,  medications to reduce stomach acid, and  surgery to prevent reflux. Most people with LPR need to modify how and when they eat, as well as take some medication, to get well. Sometimes, nonprescription liquid antacids, such as Maalox, Gelucil and Mylanta are recommended. When used, these antacids should be taken four times each day - one tablespoon one hour after each meal and before bedtime. Dietary and lifestyle changes alone are not often enough to control LPR - medications that reduce stomach acid are also usually needed. These must be prescribed by our doctor.   TIPS FOR REDUCING REFLUX AND LPR Control your LIFE-STYLE and your DIET! If you use tobacco, QUIT.  Smoking makes you reflux. After every cigarette you have some LPR.  Don't wear clothing that is too tight, especially around the waist (trousers, corsets, belts).  Do not lie down just after eating...in fact, do not eat within three hours of bedtime.  You should be on a low-fat diet.  Limit your intake of red meat.  Limit your intake of butter.  Avoid fried foods.  Avoid chocolate  Avoid cheese.  Avoid eggs. Specifically avoid caffeine (especially coffee and tea), soda pop (especially cola) and mints.  Avoid alcoholic beverages, particularly in the evening.   Diverticulosis Diverticulosis is a condition that develops when small pouches (diverticula) form in the wall of the large intestine (colon). The colon is  where water is absorbed and stool (feces) is formed. The pouches form when the inside layer of the colon pushes through weak spots in the outer layers of the colon. You may have a few pouches or many of them. The pouches usually do not cause problems unless they become inflamed or infected. When this happens, the condition is called diverticulitis- this is left lower quadrant pain, diarrhea, fever, chills, nausea or vomiting.  If this occurs please call the office or go to the hospital. Sometimes these patches  without inflammation can also have painless bleeding associated with them, if this happens please call the office or go to the hospital. Preventing constipation and increasing fiber can help reduce diverticula and prevent complications. Even if you feel you have a high-fiber diet, suggest getting on Benefiber 2-3 times daily. What increases the risk? The following factors may make you more likely to develop this condition: Being older than age 99. Your risk for this condition increases with age. Diverticulosis is rare among people younger than age 58. By age 65, many people have it. Eating a low-fiber diet. Having frequent constipation. Being overweight. Not getting enough exercise. Smoking. Taking over-the-counter pain medicines, like aspirin and ibuprofen.- can increase a flare.  Having a family history of diverticulosis. How is this diagnosed? Because diverticulosis usually has no symptoms, it is most often diagnosed during an exam for other colon problems. The condition may be diagnosed by: Using a flexible scope to examine the colon (colonoscopy). Taking an X-ray of the colon after dye has been put into the colon (barium enema). Having a CT scan. How is this treated? You may not need treatment for this condition. Your health care provider may recommend treatment to prevent problems. You may need treatment if you have symptoms or if you previously had diverticulitis. Treatment may include: Eating a high-fiber diet. Taking a fiber supplement. Taking a live bacteria supplement (probiotic). Taking medicine to relax your colon. Contact a health care provider if you: Have pain in your abdomen. Have bloating. Have cramps. Have not had a bowel movement in 3 days. Get help right away if: Your pain gets worse. Your bloating becomes very bad. You have a fever or chills, and your symptoms suddenly get worse. You vomit. You have bowel movements that are bloody or black. You have bleeding  from your rectum. Summary Diverticulosis is a condition that develops when small pouches (diverticula) form in the wall of the large intestine (colon). You may have a few pouches or many of them. This condition is most often diagnosed during an exam for other colon problems. Treatment may include increasing the fiber in your diet, taking supplements, or taking medicines. This information is not intended to replace advice given to you by your health care provider. Make sure you discuss any questions you have with your health care provider. Document Revised: 10/24/2018 Document Reviewed: 10/24/2018 Elsevier Patient Education  [place].

## 2021-08-29 NOTE — Telephone Encounter (Signed)
Requesting: hydrocodone 7.5-'325mg'$   Contract: 01/29/20 UDS: 03/24/21 Last Visit: 07/26/21 Next Visit: 11/10/21 Last Refill: 07/28/2021 #120 and 0RF  Please Advise

## 2021-08-30 NOTE — Progress Notes (Signed)
I agree with the above note, plan 

## 2021-09-07 ENCOUNTER — Ambulatory Visit (HOSPITAL_BASED_OUTPATIENT_CLINIC_OR_DEPARTMENT_OTHER): Payer: Medicare HMO | Admitting: Physical Therapy

## 2021-09-07 ENCOUNTER — Encounter (HOSPITAL_BASED_OUTPATIENT_CLINIC_OR_DEPARTMENT_OTHER): Payer: Self-pay | Admitting: Physical Therapy

## 2021-09-07 DIAGNOSIS — M6281 Muscle weakness (generalized): Secondary | ICD-10-CM | POA: Diagnosis not present

## 2021-09-07 DIAGNOSIS — M961 Postlaminectomy syndrome, not elsewhere classified: Secondary | ICD-10-CM | POA: Diagnosis not present

## 2021-09-07 DIAGNOSIS — M5459 Other low back pain: Secondary | ICD-10-CM | POA: Diagnosis not present

## 2021-09-07 DIAGNOSIS — R262 Difficulty in walking, not elsewhere classified: Secondary | ICD-10-CM | POA: Diagnosis not present

## 2021-09-07 DIAGNOSIS — M47816 Spondylosis without myelopathy or radiculopathy, lumbar region: Secondary | ICD-10-CM | POA: Diagnosis not present

## 2021-09-07 NOTE — Therapy (Signed)
OUTPATIENT PHYSICAL THERAPY THORACOLUMBAR TREATMENT   Patient Name: [name] MRN: [id number] DOB:1946/03/04, 76 y.o., female Today's Date: 09/07/2021   PT End of Session - 09/07/21 1256     Visit Number 3    Number of Visits 18    Date for PT Re-Evaluation 11/17/21    Authorization Type Aetna MCR    PT Start Time 1300    PT Stop Time 7793    PT Time Calculation (min) 45 min    Activity Tolerance Patient tolerated treatment well;Patient limited by pain    Behavior During Therapy [facility] for tasks assessed/performed              Past Medical History:  Diagnosis Date   Arthritis    Cataract    both eyes rt. eye was removed   Chest pain    stress test @ [place] [facility] (-)   Colonic polyp    bx adenomatous polyps, next scope 2010   COPD (chronic obstructive pulmonary disease) ([place])    Cornea disorder    NODULES REMOVED FROM CORNEA   Fibromyalgia    see rheumatology   GERD (gastroesophageal reflux disease)    PAST HX    Hyperlipidemia    Hypothyroidism    Migraine    Ocular rosacea 03/2009   on doxy   Osteoporosis    osteopenia   Pleuritic chest pain    etiology unclear, has beeb eval by pulmonary and rheumatology (autoimmune dz?)   Wrist fracture 2018   Past Surgical History:  Procedure Laterality Date   ANTERIOR LAT LUMBAR FUSION N/A 12/03/2019   Procedure: ANTERIOR LATERAL LUMBAR FUSION L2-4;  Surgeon: Melina Schools, MD;  Location: [place];  Service: Orthopedics;  Laterality: N/A;  4 hrs   COLONOSCOPY     COSMETIC SURGERY  04/12/2018   L upper eyelid ptosis repair, and trichophytic brow lift   ESOPHAGOGASTRODUODENOSCOPY (EGD) WITH PROPOFOL N/A 03/27/2013   Procedure: ESOPHAGOGASTRODUODENOSCOPY (EGD) WITH PROPOFOL;  Surgeon: Milus Banister, MD;  Location: [facility];  Service: Endoscopy;  Laterality: N/A;  possible dil   EYE SURGERY     growth on cornea-bil.   plastic surgery face  2013   POLYPECTOMY     right rotator cuff repair  2010    TONSILLECTOMY     UPPER GASTROINTESTINAL ENDOSCOPY     Patient Active Problem List   Diagnosis Date Noted   Fusion of lumbar spine 12/03/2019   COPD (chronic obstructive pulmonary disease) ([place]) 08/22/2019   Spinal stenosis of lumbar region with neurogenic claudication 11/22/2018   Coronary artery disease due to lipid rich plaque 10/03/2018   Closed fracture of distal end of radius 04/16/2017   Chronic back pain 03/15/2017   PCP NOTES >>>>>>>>>>>>>>>>>>>> 05/24/2015   Numbness and tingling of right arm 05/22/2015   Insomnia 09/01/2014   GERD (gastroesophageal reflux disease) 03/27/2013   Dysphagia, pharyngoesophageal phase 03/27/2013   Annual physical exam 06/12/2011   Sinusitis, chronic 02/02/2011   Osteoporosis 06/08/2010   Fibromyalgia, pain mngmt 01/29/2009   LIPOMA 09/30/2008   Migraine without aura 03/04/2007   Hypothyroidism 05/15/2006   COLONIC POLYPS, HX OF 05/15/2006    PCP:  Kathlene November, MD  REFERRING PROVIDER:  Alysia Penna, MD  REFERRING DIAG:  [id number] (ICD-10-CM) - Spondylosis without myelopathy or radiculopathy, lumbar region  M96.1 (ICD-10-CM) - Lumbar post-laminectomy syndrome    THERAPY DIAG:  Other low back pain  Muscle weakness (generalized)  Difficulty walking  ONSET DATE: March 2023   SUBJECTIVE:  SUBJECTIVE STATEMENT:  Pt states she felt some relief after last session that lasted for about a day or two. She is more active at home and could be increasing the pain. She notices that now walking around a store her legs are tired and heavy.    Previous Episode:   Pt has R sided hip and thigh pain and weakness.  Pt describes pain as achey and deep within the hip. Pain does not cross knee. Current 4/10, Best 0/10, Worst 9/10. Pt states the pain started about 6  months ago. She went to PT at [facility] from Oct to Dec in 2021 and did not get better. She stated it started to decline. She then went to [place] for PT again. She reports it was still getting worse and got a steroid injection. She states she is here for aquatic therapy. Pt denies NT into the R LE. Aggs: stairs, pivoting, sleeping on L side, R LE WB, getting in and out of cars, walking without support, sitting too long; Eases: sitting/resting, ice, meds. Denies pain with cough, sneezing, laughing. Pt states she sits for most of the day ~ 8 hrs. Pt does currently walk for 15 mins a day currently for exercise. Pt states she is not doing previous PT exercises. Pt denies unexpected weight loss but will have night pain after increased activity.   PERTINENT HISTORY:  wrist fx, pleuritic chest pain, osteoporosis, migraine, hypothyroidism, glaucoma, fibromyalgia, R RTC repair, anterior lateral lumbar fusion L2-12 Nov 2019   PAIN:  Are you having pain? Yes: NPRS scale: 2/10 Pain location: R  hip, L/S, R SIJ Pain description: tightness, strained muscle, pulled muscle Aggravating factors: bending, lifting, stairs, sleeping, sitting, standing too long, tighter pants Relieving factors: ice, stretching, pain pill   PRECAUTIONS: None  WEIGHT BEARING RESTRICTIONS No  FALLS:  Has patient fallen in last 6 months? No  LIVING ENVIRONMENT: Lives with: lives with their family and lives with their spouse Lives in: House/apartment Stairs: Yes 2 story home Has following equipment at home: None  OCCUPATION: retired  PLOF: Independent with basic ADLs  PATIENT GOALS : Pt states she would like to be able to reduce constant pain   OBJECTIVE:   DIAGNOSTIC FINDINGS:  IMPRESSION: 1. Interval L2-3 and L3-4 PLIF with probable solid arthrodesis. No recurrent impingement. There has been progression of degenerative disease at the remaining open lumbar levels. Mild discogenic edema at L1-2 and L5-S1. 2. L1-2  high-grade spinal stenosis and moderate left foraminal narrowing. 3. L4-5 high-grade spinal stenosis. 4. L5-S1 right more than left subarticular recess narrowing. Moderate bilateral foraminal narrowing.  PATIENT SURVEYS:  FOTO 45 49 at DC  LUMBAR ROM:   Active  A/PROM  08/19/2021  Flexion 75%  Extension 25% p!  Right lateral flexion 50%p1  Left lateral flexion 50%  Right rotation 50%  Left rotation 50%   (Blank rows = not tested)  LE MMT:  myotomal weakness of R LE noted at hip, knee, and ankle eversion, 4/5 quickly fatiguing  MMT Right 08/19/2021 Left 08/19/2021  Hip flexion myotomal weakness 4+/5  Hip extension    Hip abduction myotomal weakness 4+/5  Hip adduction myotomal weakness 4+/5  Knee flexion myotomal weakness 4+/5  Knee extension myotomal weakness 4+/5  Ankle dorsiflexion 4+/5 4+/5  Ankle eversion myotomal weakness 4+/5   (Blank rows = not tested)  FUNCTIONAL TESTS:  5 times sit to stand: 19.1s  eval   TODAY'S TREATMENT   5/31  Manual: STM R lumbar paraspinals and  QL  Supine curl up with ADD squeeze 1s 2x10 Supine ab brace with LE ext 2x10 each side PPT with bridge 2x10  HEP review and update   5/17  Manual: trigger point release to lumbar spine; reviewed trigger points and how she can work on them using the thera-cane at home.   LTR x15  Gluteal stretch in supine 2x20 sec hold right   Supine March   Eval:  Exercises - Seated Flexion Stretch with Swiss [place]  - 2 x daily - 7 x weekly - 1 sets - 10 reps - 10 hold - Supine Posterior Pelvic Tilt  - 2 x daily - 7 x weekly - 2 sets - 10 reps - 2 hold - Seated Quadratus Lumborum Stretch in Chair  - 2 x daily - 7 x weekly - 1 sets - 3 reps - 30 hold   PATIENT EDUCATION:  Education details:  anatomy, abdominal bracing, IA pressure, exercise progression, muscle firing,  envelope of function, HEP, POC  Person educated: Patient Education method: Explanation, Demonstration, Tactile cues, Verbal  cues, and Handouts Education comprehension: verbalized understanding, returned demonstration, verbal cues required, and tactile cues required   HOME EXERCISE PROGRAM: Access Code: [email or web address] URL: [email or web address] Date: 08/19/2021 Prepared by: Daleen Bo   ASSESSMENT:  CLINICAL IMPRESSION:  Pt with improved soft tissue extensibility following STM at today's session. Pt then able to introduce abdominal bracing technique with exercise. Pt required VC and TC for proper bracing technique during supine exercise. Pt had report of improved pain following session. Pt responds well to TrA training and gentle lumbopelvic exercise. Plan for next session to be aquatic in order to progress functional mobility.   Pt would benefit from continued skilled therapy in order to reach goals and maximize functional R hip strength and ROM for prevention of further functional decline.    OBJECTIVE IMPAIRMENTS Abnormal gait, decreased activity tolerance, decreased balance, decreased endurance, decreased knowledge of condition, decreased mobility, difficulty walking, decreased ROM, decreased strength, hypomobility, increased muscle spasms, impaired flexibility, improper body mechanics, postural dysfunction, and pain.   ACTIVITY LIMITATIONS cleaning, community activity, driving, occupation, laundry, yard work, shopping, and exercise .   PERSONAL FACTORS Age, Behavior pattern, Fitness, Past/current experiences, Time since onset of injury/illness/exacerbation, and 1-2 comorbidities:    are also affecting patient's functional outcome.    REHAB POTENTIAL: Fair    CLINICAL DECISION MAKING: Evolving/moderate complexity  EVALUATION COMPLEXITY: Moderate   GOALS:   SHORT TERM GOALS: Target date: 09/30/2021    Pt will become independent with HEP in order to demonstrate synthesis of PT education.  Baseline: Goal status: INITIAL  2.  Pt will be able to demonstrate STS with full hip extension in  order to demonstrate functional improvement in R hip strength and ROM for self-care and house hold duties.  Baseline:  Goal status: INITIAL  3.  Pt will score at least 5 pt increase on FOTO to demonstrate functional improvement in MCII and pt perceived function.   Baseline:  Goal status: INITIAL   LONG TERM GOALS: Target date: 11/11/2021   Pt will become independent with final HEP in order to demonstrate synthesis of PT education.  Baseline:  Goal status: INITIAL  2.  Pt will be able to perform 5XSTS in under 12 in order to demonstrate functional improvement above the cut off score for older adults.  Baseline:  Goal status: INITIAL  3.  Pt will be able to demonstrate ability to perform step to pattern on stairs without UE  and pain in order to demonstrate functional improvement in L/S and LE function for self-care and house hold duties.  Baseline:  Goal status: INITIAL  4.  Pt will score >/= 49 on FOTO to demonstrate improvement in perceived L/S function.  Baseline:  Goal status: INITIAL    PLAN: PT FREQUENCY: 1-2x/week  PT DURATION: 12 weeks (likely D/C by 10 wks)  PLANNED INTERVENTIONS: Therapeutic exercises, Therapeutic activity, Neuromuscular re-education, Balance training, Gait training, Patient/Family education, Joint manipulation, Joint mobilization, Stair training, Orthotic/Fit training, Aquatic Therapy, Dry Needling, Electrical stimulation, Spinal manipulation, Spinal mobilization, Cryotherapy, Moist heat, Compression bandaging, scar mobilization, Taping, Vasopneumatic device, Traction, Ultrasound, Ionotophoresis '4mg'$ /ml Dexamethasone, and Manual therapy.  PLAN FOR NEXT SESSION: review HEP, flexion stretching, sciatic n. Glide, general lumbopelvic stability/strength   Daleen Bo, PT 09/07/2021, 2:01 PM

## 2021-09-09 DIAGNOSIS — Z0289 Encounter for other administrative examinations: Secondary | ICD-10-CM

## 2021-09-10 NOTE — Telephone Encounter (Signed)
Prolia VOB initiated via [email or web address]  Last OV:  Next OV:  Last Prolia inj 04/08/21 Nex Prolia inj due 10/08/21

## 2021-09-12 ENCOUNTER — Ambulatory Visit (HOSPITAL_BASED_OUTPATIENT_CLINIC_OR_DEPARTMENT_OTHER): Payer: Medicare HMO | Attending: Physical Medicine & Rehabilitation | Admitting: Physical Therapy

## 2021-09-12 ENCOUNTER — Encounter (HOSPITAL_BASED_OUTPATIENT_CLINIC_OR_DEPARTMENT_OTHER): Payer: Self-pay | Admitting: Physical Therapy

## 2021-09-12 DIAGNOSIS — M25551 Pain in right hip: Secondary | ICD-10-CM | POA: Insufficient documentation

## 2021-09-12 DIAGNOSIS — M5459 Other low back pain: Secondary | ICD-10-CM | POA: Diagnosis not present

## 2021-09-12 DIAGNOSIS — R262 Difficulty in walking, not elsewhere classified: Secondary | ICD-10-CM | POA: Diagnosis not present

## 2021-09-12 DIAGNOSIS — M6281 Muscle weakness (generalized): Secondary | ICD-10-CM | POA: Insufficient documentation

## 2021-09-12 NOTE — Therapy (Signed)
OUTPATIENT PHYSICAL THERAPY THORACOLUMBAR TREATMENT   Patient Name: [name] MRN: [id number] DOB:1946/01/22, 76 y.o., female Today's Date: 09/12/2021   PT End of Session - 09/12/21 1210     Visit Number 4    Number of Visits 18    Date for PT Re-Evaluation 11/17/21    Authorization Type Aetna MCR    PT Start Time 7001    PT Stop Time 1225    PT Time Calculation (min) 40 min    Activity Tolerance Patient tolerated treatment well;Patient limited by pain    Behavior During Therapy [facility] for tasks assessed/performed               Past Medical History:  Diagnosis Date   Arthritis    Cataract    both eyes rt. 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   on doxy   Osteoporosis    osteopenia   Pleuritic chest pain    etiology unclear, has beeb eval by pulmonary and rheumatology (autoimmune dz?)   Wrist fracture 2018   Past Surgical History:  Procedure Laterality Date   ANTERIOR LAT LUMBAR FUSION N/A 12/03/2019   Procedure: ANTERIOR LATERAL LUMBAR FUSION L2-4;  Surgeon: Melina Schools, MD;  Location: [place];  Service: Orthopedics;  Laterality: N/A;  4 hrs   COLONOSCOPY     COSMETIC SURGERY  04/12/2018   L upper eyelid ptosis repair, and trichophytic brow lift   ESOPHAGOGASTRODUODENOSCOPY (EGD) WITH PROPOFOL N/A 03/27/2013   Procedure: ESOPHAGOGASTRODUODENOSCOPY (EGD) WITH PROPOFOL;  Surgeon: Milus Banister, MD;  Location: [facility];  Service: Endoscopy;  Laterality: N/A;  possible dil   EYE SURGERY     growth on cornea-bil.   plastic surgery face  2013   POLYPECTOMY     right rotator cuff repair  2010    TONSILLECTOMY     UPPER GASTROINTESTINAL ENDOSCOPY     Patient Active Problem List   Diagnosis Date Noted   Fusion of lumbar spine 12/03/2019   COPD (chronic obstructive pulmonary disease) ([place]) 08/22/2019   Spinal stenosis of lumbar region with neurogenic claudication 11/22/2018   Coronary artery disease due to lipid rich plaque 10/03/2018   Closed fracture of distal end of radius 04/16/2017   Chronic back pain 03/15/2017   PCP NOTES >>>>>>>>>>>>>>>>>>>> 05/24/2015   Numbness and tingling of right arm 05/22/2015   Insomnia 09/01/2014   GERD (gastroesophageal reflux disease) 03/27/2013   Dysphagia, pharyngoesophageal phase 03/27/2013   Annual physical exam 06/12/2011   Sinusitis, chronic 02/02/2011   Osteoporosis 06/08/2010   Fibromyalgia, pain mngmt 01/29/2009   LIPOMA 09/30/2008   Migraine without aura 03/04/2007   Hypothyroidism 05/15/2006   COLONIC POLYPS, HX OF 05/15/2006    PCP:  Kathlene November, MD  REFERRING PROVIDER:  Alysia Penna, MD  REFERRING DIAG:  [phone] (ICD-10-CM) - Spondylosis without myelopathy or radiculopathy, lumbar region  M96.1 (ICD-10-CM) - Lumbar post-laminectomy syndrome    THERAPY DIAG:  Other low back pain  Muscle weakness (generalized)  Difficulty walking  Pain in right hip  ONSET  DATE: March 2023   SUBJECTIVE:                                                                                                                                                                                           SUBJECTIVE STATEMENT:  Pt states that she was in a lot of pain this morning 12/10. She was unable to sleep at night. She arrived early and was able to do a few warm up laps of walking fwd and reduced pain to 3/10.    Previous Episode:   Pt has R sided hip and thigh pain and weakness.  Pt describes pain as achey and deep within the hip. Pain does not cross knee. Current 4/10, Best 0/10, Worst 9/10. Pt states the pain started about 6 months ago.  She went to PT at [facility] from Oct to Dec in 2021 and did not get better. She stated it started to decline. She then went to [place] for PT again. She reports it was still getting worse and got a steroid injection. She states she is here for aquatic therapy. Pt denies NT into the R LE. Aggs: stairs, pivoting, sleeping on L side, R LE WB, getting in and out of cars, walking without support, sitting too long; Eases: sitting/resting, ice, meds. Denies pain with cough, sneezing, laughing. Pt states she sits for most of the day ~ 8 hrs. Pt does currently walk for 15 mins a day currently for exercise. Pt states she is not doing previous PT exercises. Pt denies unexpected weight loss but will have night pain after increased activity.   PERTINENT HISTORY:  wrist fx, pleuritic chest pain, osteoporosis, migraine, hypothyroidism, glaucoma, fibromyalgia, R RTC repair, anterior lateral lumbar fusion L2-12 Nov 2019   PAIN:  Are you having pain? Yes: NPRS scale: 2/10 Pain location: R  hip, L/S, R SIJ Pain description: tightness, strained muscle, pulled muscle Aggravating factors: bending, lifting, stairs, sleeping, sitting, standing too long, tighter pants Relieving factors: ice, stretching, pain pill   PRECAUTIONS: None  WEIGHT BEARING RESTRICTIONS No  FALLS:  Has patient fallen in last 6 months? No  LIVING ENVIRONMENT: Lives with: lives with their family and lives with their spouse Lives in: House/apartment Stairs: Yes 2 story home Has following equipment at home: None  OCCUPATION: retired  PLOF: Independent with basic ADLs  PATIENT GOALS : Pt states she would like to be able to reduce constant pain   OBJECTIVE:   DIAGNOSTIC FINDINGS:  IMPRESSION: 1. Interval L2-3 and L3-4 PLIF with probable solid arthrodesis. No recurrent impingement. There has been progression of degenerative disease at the remaining open lumbar levels. Mild  discogenic edema at L1-2 and L5-S1. 2. L1-2 high-grade spinal  stenosis and moderate left foraminal narrowing. 3. L4-5 high-grade spinal stenosis. 4. L5-S1 right more than left subarticular recess narrowing. Moderate bilateral foraminal narrowing.  PATIENT SURVEYS:  FOTO 45 49 at DC  LUMBAR ROM:   Active  A/PROM  08/19/2021  Flexion 75%  Extension 25% p!  Right lateral flexion 50%p1  Left lateral flexion 50%  Right rotation 50%  Left rotation 50%   (Blank rows = not tested)  LE MMT:  myotomal weakness of R LE noted at hip, knee, and ankle eversion, 4/5 quickly fatiguing  MMT Right 08/19/2021 Left 08/19/2021  Hip flexion myotomal weakness 4+/5  Hip extension    Hip abduction myotomal weakness 4+/5  Hip adduction myotomal weakness 4+/5  Knee flexion myotomal weakness 4+/5  Knee extension myotomal weakness 4+/5  Ankle dorsiflexion 4+/5 4+/5  Ankle eversion myotomal weakness 4+/5   (Blank rows = not tested)  FUNCTIONAL TESTS:  5 times sit to stand: 19.1s  eval   TODAY'S TREATMENT   6/5   Pt seen for aquatic therapy today.  Treatment took place in water 3.25-4 ft in depth at the [facility] pool. Temp of water was 92.  Pt entered/exited the pool via stairs (step to) independently with bilat rail.   Warm up: 4x sidestepping   Exercises:  Standing hip hinge at wall 2x10 with ab brace Standing lumbar flexion and rotation with noodle 10x each Standing board press and row with ab brace 2x10 Marching 3x pool width with kickboard support  Standing hip flexion with LAQ 2x10 each Standing mini squat 2x10  Tandem balance 30s 3x each side  Walking laps between exercise for active recovery      Pt requires buoyancy for support and to offload joints with strengthening exercises. Viscosity of the water is needed for resistance of strengthening; water current perturbations provides challenge to standing balance unsupported, requiring increased core activation.  5/31  Manual: STM R lumbar paraspinals and QL  Supine curl up  with ADD squeeze 1s 2x10 Supine ab brace with LE ext 2x10 each side PPT with bridge 2x10  HEP review and update   5/17  Manual: trigger point release to lumbar spine; reviewed trigger points and how she can work on them using the thera-cane at home.   LTR x15  Gluteal stretch in supine 2x20 sec hold right   Supine March   Eval:  Exercises - Seated Flexion Stretch with Swiss [place]  - 2 x daily - 7 x weekly - 1 sets - 10 reps - 10 hold - Supine Posterior Pelvic Tilt  - 2 x daily - 7 x weekly - 2 sets - 10 reps - 2 hold - Seated Quadratus Lumborum Stretch in Chair  - 2 x daily - 7 x weekly - 1 sets - 3 reps - 30 hold   PATIENT EDUCATION:  Education details:  anatomy, abdominal bracing, IA pressure, exercise progression, muscle firing,  envelope of function, HEP, POC  Person educated: Patient Education method: Explanation, Demonstration, Tactile cues, Verbal cues, and Handouts Education comprehension: verbalized understanding, returned demonstration, verbal cues required, and tactile cues required   HOME EXERCISE PROGRAM: Access Code: [email or web address] URL: [email or web address] Date: 08/19/2021 Prepared by: Daleen Bo   ASSESSMENT:  CLINICAL IMPRESSION:  Pt able to tolerate standing exercise and generalized mobility without increasing pain until performing tandem balance exercise. Irritation appeared to be within R hip ER and ABD group, especially with R LE in  rear. Pt did have improvement with mobility following session but did have pain reach up to 5/10. Pt did not appear to have a directional preference at today's session. Plan to assess response to today's session at beginning of next to determine appropriateness of continued aquatic or modified land appts.   Pt would benefit from continued skilled therapy in order to reach goals and maximize functional R hip strength and ROM for prevention of further functional decline.    OBJECTIVE IMPAIRMENTS Abnormal gait,  decreased activity tolerance, decreased balance, decreased endurance, decreased knowledge of condition, decreased mobility, difficulty walking, decreased ROM, decreased strength, hypomobility, increased muscle spasms, impaired flexibility, improper body mechanics, postural dysfunction, and pain.   ACTIVITY LIMITATIONS cleaning, community activity, driving, occupation, laundry, yard work, shopping, and exercise .   PERSONAL FACTORS Age, Behavior pattern, Fitness, Past/current experiences, Time since onset of injury/illness/exacerbation, and 1-2 comorbidities:    are also affecting patient's functional outcome.    REHAB POTENTIAL: Fair    CLINICAL DECISION MAKING: Evolving/moderate complexity  EVALUATION COMPLEXITY: Moderate   GOALS:   SHORT TERM GOALS: Target date: 09/30/2021    Pt will become independent with HEP in order to demonstrate synthesis of PT education.  Baseline: Goal status: INITIAL  2.  Pt will be able to demonstrate STS with full hip extension in order to demonstrate functional improvement in R hip strength and ROM for self-care and house hold duties.  Baseline:  Goal status: INITIAL  3.  Pt will score at least 5 pt increase on FOTO to demonstrate functional improvement in MCII and pt perceived function.   Baseline:  Goal status: INITIAL   LONG TERM GOALS: Target date: 11/11/2021   Pt will become independent with final HEP in order to demonstrate synthesis of PT education.  Baseline:  Goal status: INITIAL  2.  Pt will be able to perform 5XSTS in under 12 in order to demonstrate functional improvement above the cut off score for older adults.  Baseline:  Goal status: INITIAL  3.  Pt will be able to demonstrate ability to perform step to pattern on stairs without UE and pain in order to demonstrate functional improvement in L/S and LE function for self-care and house hold duties.  Baseline:  Goal status: INITIAL  4.  Pt will score >/= 49 on FOTO to demonstrate  improvement in perceived L/S function.  Baseline:  Goal status: INITIAL    PLAN: PT FREQUENCY: 1-2x/week  PT DURATION: 12 weeks (likely D/C by 10 wks)  PLANNED INTERVENTIONS: Therapeutic exercises, Therapeutic activity, Neuromuscular re-education, Balance training, Gait training, Patient/Family education, Joint manipulation, Joint mobilization, Stair training, Orthotic/Fit training, Aquatic Therapy, Dry Needling, Electrical stimulation, Spinal manipulation, Spinal mobilization, Cryotherapy, Moist heat, Compression bandaging, scar mobilization, Taping, Vasopneumatic device, Traction, Ultrasound, Ionotophoresis '4mg'$ /ml Dexamethasone, and Manual therapy.  PLAN FOR NEXT SESSION: review HEP, flexion stretching, sciatic n. Glide, general lumbopelvic stability/strength   Daleen Bo, PT 09/12/2021, 12:36 PM

## 2021-09-15 ENCOUNTER — Encounter (HOSPITAL_BASED_OUTPATIENT_CLINIC_OR_DEPARTMENT_OTHER): Payer: Self-pay | Admitting: Physical Therapy

## 2021-09-15 ENCOUNTER — Ambulatory Visit (HOSPITAL_BASED_OUTPATIENT_CLINIC_OR_DEPARTMENT_OTHER): Payer: Medicare HMO | Admitting: Physical Therapy

## 2021-09-15 DIAGNOSIS — R262 Difficulty in walking, not elsewhere classified: Secondary | ICD-10-CM

## 2021-09-15 DIAGNOSIS — M5459 Other low back pain: Secondary | ICD-10-CM | POA: Diagnosis not present

## 2021-09-15 DIAGNOSIS — M6281 Muscle weakness (generalized): Secondary | ICD-10-CM | POA: Diagnosis not present

## 2021-09-15 DIAGNOSIS — M25551 Pain in right hip: Secondary | ICD-10-CM | POA: Diagnosis not present

## 2021-09-15 NOTE — Therapy (Signed)
OUTPATIENT PHYSICAL THERAPY THORACOLUMBAR TREATMENT   Patient Name: [name] MRN: [id number] DOB:09-Apr-1946, 76 y.o., female Today's Date: 09/15/2021   PT End of Session - 09/15/21 1107     Visit Number 5    Number of Visits 18    Date for PT Re-Evaluation 11/17/21    Authorization Type [employer] MCR    PT Start Time 1107    PT Stop Time 1145    PT Time Calculation (min) 38 min    Activity Tolerance Patient tolerated treatment well;Patient limited by pain    Behavior During Therapy [facility] for tasks assessed/performed               Past Medical History:  Diagnosis Date   Arthritis    Cataract    both eyes rt. 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   Fibromyalgia    see rheumatology   GERD (gastroesophageal reflux disease)    PAST HX    Hyperlipidemia    Hypothyroidism    Migraine    Ocular rosacea 03/2009   on doxy   Osteoporosis    osteopenia   Pleuritic chest pain    etiology unclear, has beeb eval by pulmonary and rheumatology (autoimmune dz?)   Wrist fracture 2018   Past Surgical History:  Procedure Laterality Date   ANTERIOR LAT LUMBAR FUSION N/A 12/03/2019   Procedure: ANTERIOR LATERAL LUMBAR FUSION L2-4;  Surgeon: Melina Schools, MD;  Location: [place];  Service: Orthopedics;  Laterality: N/A;  4 hrs   COLONOSCOPY     COSMETIC SURGERY  04/12/2018   L upper eyelid ptosis repair, and trichophytic brow lift   ESOPHAGOGASTRODUODENOSCOPY (EGD) WITH PROPOFOL N/A 03/27/2013   Procedure: ESOPHAGOGASTRODUODENOSCOPY (EGD) WITH PROPOFOL;  Surgeon: Milus Banister, MD;  Location: [facility];  Service: Endoscopy;  Laterality: N/A;  possible dil   EYE SURGERY     growth on cornea-bil.   plastic surgery face  2013   POLYPECTOMY     right rotator cuff repair  2010    TONSILLECTOMY     UPPER GASTROINTESTINAL ENDOSCOPY     Patient Active Problem List   Diagnosis Date Noted   Fusion of lumbar spine 12/03/2019   COPD (chronic obstructive pulmonary disease) ([place]) 08/22/2019   Spinal stenosis of lumbar region with neurogenic claudication 11/22/2018   Coronary artery disease due to lipid rich plaque 10/03/2018   Closed fracture of distal end of radius 04/16/2017   Chronic back pain 03/15/2017   PCP NOTES >>>>>>>>>>>>>>>>>>>> 05/24/2015   Numbness and tingling of right arm 05/22/2015   Insomnia 09/01/2014   GERD (gastroesophageal reflux disease) 03/27/2013   Dysphagia, pharyngoesophageal phase 03/27/2013   Annual physical exam 06/12/2011   Sinusitis, chronic 02/02/2011   Osteoporosis 06/08/2010   Fibromyalgia, pain mngmt 01/29/2009   LIPOMA 09/30/2008   Migraine without aura 03/04/2007   Hypothyroidism 05/15/2006   COLONIC POLYPS, HX OF 05/15/2006    PCP:  Kathlene November, MD  REFERRING PROVIDER:  Alysia Penna, MD  REFERRING DIAG:  [id number] (ICD-10-CM) - Spondylosis without myelopathy or radiculopathy, lumbar region  M96.1 (ICD-10-CM) - Lumbar post-laminectomy syndrome    THERAPY DIAG:  Other low back pain  Muscle weakness (generalized)  Difficulty walking  ONSET DATE: March 2023  SUBJECTIVE:                                                                                                                                                                                           SUBJECTIVE STATEMENT:  Pt reports only 1 day of soreness after last session without adverse effects or increase in pain. Pt is able and would like to do aquatic therapy today.    Previous Episode:   Pt has R sided hip and thigh pain and weakness.  Pt describes pain as achey and deep within the hip. Pain does not cross knee. Current 4/10, Best 0/10, Worst 9/10. Pt states the pain started about 6 months ago. She went to PT at [facility] from Oct to Dec in 2021 and did not  get better. She stated it started to decline. She then went to [facility] again. She reports it was still getting worse and got a steroid injection. She states she is here for aquatic therapy. Pt denies NT into the R LE. Aggs: stairs, pivoting, sleeping on L side, R LE WB, getting in and out of cars, walking without support, sitting too long; Eases: sitting/resting, ice, meds. Denies pain with cough, sneezing, laughing. Pt states she sits for most of the day ~ 8 hrs. Pt does currently walk for 15 mins a day currently for exercise. Pt states she is not doing previous PT exercises. Pt denies unexpected weight loss but will have night pain after increased activity.   PERTINENT HISTORY:  wrist fx, pleuritic chest pain, osteoporosis, migraine, hypothyroidism, glaucoma, fibromyalgia, R RTC repair, anterior lateral lumbar fusion L2-12 Nov 2019   PAIN:  Are you having pain? Yes: NPRS scale: 2/10 Pain location: R  hip, L/S, R SIJ Pain description: tightness, strained muscle, pulled muscle Aggravating factors: bending, lifting, stairs, sleeping, sitting, standing too long, tighter pants Relieving factors: ice, stretching, pain pill   PRECAUTIONS: None  WEIGHT BEARING RESTRICTIONS No  FALLS:  Has patient fallen in last 6 months? No  LIVING ENVIRONMENT: Lives with: lives with their family and lives with their spouse Lives in: House/apartment Stairs: Yes 2 story home Has following equipment at home: None  OCCUPATION: retired  PLOF: Independent with basic ADLs  PATIENT GOALS : Pt states she would like to be able to reduce constant pain   OBJECTIVE:   DIAGNOSTIC FINDINGS:  IMPRESSION: 1. Interval L2-3 and L3-4 PLIF with probable solid arthrodesis. No recurrent impingement. There has been progression of degenerative disease at the remaining open lumbar levels. Mild discogenic edema at L1-2 and L5-S1. 2. L1-2 high-grade spinal stenosis and moderate left foraminal narrowing. 3. L4-5  high-grade spinal stenosis. 4. L5-S1 right more than left subarticular recess narrowing. Moderate bilateral foraminal narrowing.  PATIENT SURVEYS:  FOTO 45 49 at DC  LUMBAR ROM:   Active  A/PROM  08/19/2021  Flexion 75%  Extension 25% p!  Right lateral flexion 50%p1  Left lateral flexion 50%  Right rotation 50%  Left rotation 50%   (Blank rows = not tested)  LE MMT:  myotomal weakness of R LE noted at hip, knee, and ankle eversion, 4/5 quickly fatiguing  MMT Right 08/19/2021 Left 08/19/2021  Hip flexion myotomal weakness 4+/5  Hip extension    Hip abduction myotomal weakness 4+/5  Hip adduction myotomal weakness 4+/5  Knee flexion myotomal weakness 4+/5  Knee extension myotomal weakness 4+/5  Ankle dorsiflexion 4+/5 4+/5  Ankle eversion myotomal weakness 4+/5   (Blank rows = not tested)  FUNCTIONAL TESTS:  5 times sit to stand: 19.1s  eval   TODAY'S TREATMENT  6/8   Pt seen for aquatic therapy today.  Treatment took place in water 3.25-4 ft in depth at the [facility] pool. Temp of water was 92.  Pt entered/exited the pool via stairs (step to) independently with bilat rail.   Warm up: 3x sidestepping, fwd, and retro   Exercises:  Standing hip hinge at wall 2x10 with ab brace Standing lumbar flexion and rotation with noodle 10x each Standing board press and row with ab brace 2x20 Kickboard resisted trunk and hip rotation 2x10 Marching 3x pool width with kickboard support  Standing hip flexion with LAQ 2x10 each Standing mini squat 2x10    Walking laps between exercise for active recovery    Pt requires buoyancy for support and to offload joints with strengthening exercises. Viscosity of the water is needed for resistance of strengthening; water current perturbations provides challenge to standing balance unsupported, requiring increased core activation.   6/5   Pt seen for aquatic therapy today.  Treatment took place in water 3.25-4 ft in depth  at the [facility] pool. Temp of water was 92.  Pt entered/exited the pool via stairs (step to) independently with bilat rail.   Warm up: 4x sidestepping   Exercises:  Standing hip hinge at wall 2x10 with ab brace Standing lumbar flexion and rotation with noodle 10x each Standing board press and row with ab brace 2x10 Marching 3x pool width with kickboard support  Standing hip flexion with LAQ 2x10 each Standing mini squat 2x10  Tandem balance 30s 3x each side  Walking laps between exercise for active recovery      Pt requires buoyancy for support and to offload joints with strengthening exercises. Viscosity of the water is needed for resistance of strengthening; water current perturbations provides challenge to standing balance unsupported, requiring increased core activation.  5/31  Manual: STM R lumbar paraspinals and QL  Supine curl up with ADD squeeze 1s 2x10 Supine ab brace with LE ext 2x10 each side PPT with bridge 2x10  HEP review and update   5/17  Manual: trigger point release to lumbar spine; reviewed trigger points and how she can work on them using the thera-cane at home.   LTR x15  Gluteal stretch in supine 2x20 sec hold right   Supine March   Eval:  Exercises - Seated Flexion Stretch with Swiss Scrivener  - 2 x daily - 7 x weekly - 1 sets - 10 reps - 10 hold - Supine Posterior Pelvic Tilt  - 2 x daily - 7 x weekly - 2  sets - 10 reps - 2 hold - Seated Quadratus Lumborum Stretch in Chair  - 2 x daily - 7 x weekly - 1 sets - 3 reps - 30 hold   PATIENT EDUCATION:  Education details:  anatomy, abdominal bracing, IA pressure, exercise progression, muscle firing,  envelope of function, HEP, POC  Person educated: Patient Education method: Explanation, Demonstration, Tactile cues, Verbal cues, and Handouts Education comprehension: verbalized understanding, returned demonstration, verbal cues required, and tactile cues required   HOME EXERCISE  PROGRAM: Access Code: [email or web address] URL: [email or web address] Date: 08/19/2021 Prepared by: Daleen Bo   ASSESSMENT:  CLINICAL IMPRESSION:  Pt able to continue with aquatic therapy at today's session without increasing pain. Pt able to progress volume of repetitions as well have greater excursion with hip hinging movement. Pt also able to utilize resisted exercise in the water with increased drag for anti-rotational strength. Pt has improved with functional mobility within the pool and presents with reduced pain today. Plan to continue with aquatic therapy as tolerated as pt appears to be making good progress with functional mobility and pain management in the aquatic setting.  Pt would benefit from continued skilled therapy in order to reach goals and maximize functional R hip strength and ROM for prevention of further functional decline.    OBJECTIVE IMPAIRMENTS Abnormal gait, decreased activity tolerance, decreased balance, decreased endurance, decreased knowledge of condition, decreased mobility, difficulty walking, decreased ROM, decreased strength, hypomobility, increased muscle spasms, impaired flexibility, improper body mechanics, postural dysfunction, and pain.   ACTIVITY LIMITATIONS cleaning, community activity, driving, occupation, laundry, yard work, shopping, and exercise .   PERSONAL FACTORS Age, Behavior pattern, Fitness, Past/current experiences, Time since onset of injury/illness/exacerbation, and 1-2 comorbidities:    are also affecting patient's functional outcome.    REHAB POTENTIAL: Fair    CLINICAL DECISION MAKING: Evolving/moderate complexity  EVALUATION COMPLEXITY: Moderate   GOALS:   SHORT TERM GOALS: Target date: 09/30/2021    Pt will become independent with HEP in order to demonstrate synthesis of PT education.  Baseline: Goal status: INITIAL  2.  Pt will be able to demonstrate STS with full hip extension in order to demonstrate functional  improvement in R hip strength and ROM for self-care and house hold duties.  Baseline:  Goal status: INITIAL  3.  Pt will score at least 5 pt increase on FOTO to demonstrate functional improvement in MCII and pt perceived function.   Baseline:  Goal status: INITIAL   LONG TERM GOALS: Target date: 11/11/2021   Pt will become independent with final HEP in order to demonstrate synthesis of PT education.  Baseline:  Goal status: INITIAL  2.  Pt will be able to perform 5XSTS in under 12 in order to demonstrate functional improvement above the cut off score for older adults.  Baseline:  Goal status: INITIAL  3.  Pt will be able to demonstrate ability to perform step to pattern on stairs without UE and pain in order to demonstrate functional improvement in L/S and LE function for self-care and house hold duties.  Baseline:  Goal status: INITIAL  4.  Pt will score >/= 49 on FOTO to demonstrate improvement in perceived L/S function.  Baseline:  Goal status: INITIAL    PLAN: PT FREQUENCY: 1-2x/week  PT DURATION: 12 weeks (likely D/C by 10 wks)  PLANNED INTERVENTIONS: Therapeutic exercises, Therapeutic activity, Neuromuscular re-education, Balance training, Gait training, Patient/Family education, Joint manipulation, Joint mobilization, Stair training, Orthotic/Fit training, Aquatic Therapy, Dry Needling, Electrical stimulation, Spinal manipulation,  Spinal mobilization, Cryotherapy, Moist heat, Compression bandaging, scar mobilization, Taping, Vasopneumatic device, Traction, Ultrasound, Ionotophoresis '4mg'$ /ml Dexamethasone, and Manual therapy.  PLAN FOR NEXT SESSION: review HEP, flexion stretching, sciatic n. Glide, general lumbopelvic stability/strength   Daleen Bo, PT 09/15/2021, 11:47 AM

## 2021-09-16 ENCOUNTER — Encounter: Payer: Self-pay | Admitting: Physical Medicine & Rehabilitation

## 2021-09-16 ENCOUNTER — Encounter: Payer: Medicare HMO | Attending: Physical Medicine & Rehabilitation | Admitting: Physical Medicine & Rehabilitation

## 2021-09-16 VITALS — BP 122/79 | HR 67 | Ht 64.0 in | Wt 188.4 lb

## 2021-09-16 DIAGNOSIS — M25551 Pain in right hip: Secondary | ICD-10-CM | POA: Diagnosis not present

## 2021-09-16 NOTE — Progress Notes (Signed)
Subjective:    Patient ID: [name], female    DOB: 01/01/1946, 76 y.o.   MRN: [id number]  HPI  Patient feels that overall her pain has improved since attending aquatic therapy She is concerned that she is having some weakness in the right hip.  No new numbness or tingling.  No falls.  No bowel or bladder dysfunction She has some groin pain on the right side that seems to be activity related.  CLINICAL DATA:  Right leg weakness and lower back pain.   EXAM: MRI LUMBAR SPINE WITHOUT AND WITH CONTRAST   TECHNIQUE: Multiplanar and multiecho pulse sequences of the lumbar spine were obtained without and with intravenous contrast.   CONTRAST:  57m MULTIHANCE GADOBENATE DIMEGLUMINE 529 MG/ML IV SOLN   COMPARISON:  07/24/2015   FINDINGS: Segmentation:  5 lumbar type vertebrae as previously numbered   Alignment: L1-2 retrolisthesis measuring 3 mm. Mild anterolisthesis at L5-S1.   Vertebrae: Mild discogenic endplate edema at LW0-9and L5-S1. Remote superior endplate fracture of TW11   Conus medullaris and cauda equina: Conus extends to the L1 level. Conus and cauda equina appear normal.   Paraspinal and other soft tissues: Negative   Disc levels:   T12- L1: Mild facet spurring   L1-L2: Disc narrowing and bulging. Ligamentum flavum thickening and mild facet spurring. High-grade spinal stenosis. Moderate left foraminal narrowing   L2-L3: Interval PLIF. No neural impingement. Probable solid arthrodesis   L3-L4: Is interval PLIF. No neural impingement. Probable solid arthrodesis.   L4-L5: Disc narrowing and bulging. Degenerative facet spurring on both sides. High-grade spinal stenosis. Patent foramina   L5-S1:Disc narrowing and bulging. Degenerative facet spurring on both sides. Mild right subarticular recess narrowing that is progressed. Moderate bilateral foraminal narrowing.   IMPRESSION: 1. Interval L2-3 and L3-4 PLIF with probable solid arthrodesis.  No recurrent impingement. There has been progression of degenerative disease at the remaining open lumbar levels. Mild discogenic edema at L1-2 and L5-S1. 2. L1-2 high-grade spinal stenosis and moderate left foraminal narrowing. 3. L4-5 high-grade spinal stenosis. 4. L5-S1 right more than left subarticular recess narrowing. Moderate bilateral foraminal narrowing.     Electronically Signed   By: JJorje GuildM.D.   On: 05/11/2021 11:29 Pain Inventory Average Pain 6 Pain Right Now 3 My pain is burning and tingling  In the last 24 hours, has pain interfered with the following? General activity 7 Relation with others 1 Enjoyment of life 5 What TIME of day is your pain at its worst? evening Sleep (in general) Poor  Pain is worse with: walking, sitting, and some activites Pain improves with: rest, therapy/exercise, and medication Relief from Meds: 7  Family History  Adopted: Yes  Problem Relation Age of Onset   Cancer Neg Hx    Depression Neg Hx    Diabetes Neg Hx    Stroke Neg Hx    Hypertension Neg Hx    Heart disease Neg Hx    Social History   Socioeconomic History   Marital status: Married    Spouse name: Not on file   Number of children: 0   Years of education: Not on file   Highest education level: Not on file  Occupational History   Occupation: retired, cMedical sales representativework    [employer] farm manager RETIRED  Tobacco Use   Smoking status: Former    Packs/day: 1.00    Years: 38.50    Total pack years: 38.50    Types: Cigarettes    Quit date:  01/13/2004    Years since quitting: 17.6   Smokeless tobacco: Never   Tobacco comments:    quit 2005  Vaping Use   Vaping Use: Never used  Substance and Sexual Activity   Alcohol use: No    Alcohol/week: 0.0 standard drinks of alcohol    Comment: rare   Drug use: No   Sexual activity: Not Currently    Comment: first intercourse >16  Other Topics Concern   Not on file  Social History Narrative   No biological children       Social Determinants of Health   Financial Resource Strain: Low Risk  (08/18/2021)   Overall Financial Resource Strain (CARDIA)    Difficulty of Paying Living Expenses: Not hard at all  Food Insecurity: No Food Insecurity (08/18/2021)   Hunger Vital Sign    Worried About Running Out of Food in the Last Year: Never true    Ran Out of Food in the Last Year: Never true  Transportation Needs: No Transportation Needs (08/18/2021)   PRAPARE - Hydrologist (Medical): No    Lack of Transportation (Non-Medical): No  Physical Activity: Sufficiently Active (08/18/2021)   Exercise Vital Sign    Days of Exercise per Week: 7 days    Minutes of Exercise per Session: 30 min  Stress: No Stress Concern Present (08/18/2021)   [place]    Feeling of Stress : Not at all  Social Connections: Moderately Integrated (08/18/2021)   Social Connection and Isolation Panel [NHANES]    Frequency of Communication with Friends and Family: More than three times a week    Frequency of Social Gatherings with Friends and Family: More than three times a week    Attends Religious Services: Never    Marine scientist or Organizations: Yes    Attends Music therapist: More than 4 times per year    Marital Status: Married   Past Surgical History:  Procedure Laterality Date   ANTERIOR LAT LUMBAR FUSION N/A 12/03/2019   Procedure: ANTERIOR LATERAL LUMBAR FUSION L2-4;  Surgeon: Melina Schools, MD;  Location: [place];  Service: Orthopedics;  Laterality: N/A;  4 hrs   COLONOSCOPY     COSMETIC SURGERY  04/12/2018   L upper eyelid ptosis repair, and trichophytic brow lift   ESOPHAGOGASTRODUODENOSCOPY (EGD) WITH PROPOFOL N/A 03/27/2013   Procedure: ESOPHAGOGASTRODUODENOSCOPY (EGD) WITH PROPOFOL;  Surgeon: Milus Banister, MD;  Location: [facility];  Service: Endoscopy;  Laterality: N/A;  possible dil   EYE SURGERY      growth on cornea-bil.   plastic surgery face  2013   POLYPECTOMY     right rotator cuff repair  2010   TONSILLECTOMY     UPPER GASTROINTESTINAL ENDOSCOPY     Past Surgical History:  Procedure Laterality Date   ANTERIOR LAT LUMBAR FUSION N/A 12/03/2019   Procedure: ANTERIOR LATERAL LUMBAR FUSION L2-4;  Surgeon: Melina Schools, MD;  Location: [place];  Service: Orthopedics;  Laterality: N/A;  4 hrs   COLONOSCOPY     COSMETIC SURGERY  04/12/2018   L upper eyelid ptosis repair, and trichophytic brow lift   ESOPHAGOGASTRODUODENOSCOPY (EGD) WITH PROPOFOL N/A 03/27/2013   Procedure: ESOPHAGOGASTRODUODENOSCOPY (EGD) WITH PROPOFOL;  Surgeon: Milus Banister, MD;  Location: [facility];  Service: Endoscopy;  Laterality: N/A;  possible dil   EYE SURGERY     growth on cornea-bil.   plastic surgery face  2013  POLYPECTOMY     right rotator cuff repair  2010   TONSILLECTOMY     UPPER GASTROINTESTINAL ENDOSCOPY     Past Medical History:  Diagnosis Date   Arthritis    Cataract    both eyes rt. 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   Fibromyalgia    see rheumatology   GERD (gastroesophageal reflux disease)    PAST HX    Hyperlipidemia    Hypothyroidism    Migraine    Ocular rosacea 03/2009   on doxy   Osteoporosis    osteopenia   Pleuritic chest pain    etiology unclear, has beeb eval by pulmonary and rheumatology (autoimmune dz?)   Wrist fracture 2018   BP 122/79   Pulse 67   Ht '5\' 4"'$  (1.626 m)   Wt 188 lb 6.4 oz (85.5 kg)   LMP 04/10/1998 (Approximate)   SpO2 95%   BMI 32.34 kg/m   Opioid Risk Score:   Fall Risk Score:  `1  Depression screen [facility] 2/9     09/16/2021   11:07 AM 08/18/2021    8:30 AM 07/26/2021   10:55 AM 07/22/2021    1:12 PM 06/03/2021   10:08 AM 04/22/2021    10:18 AM 03/24/2021   11:09 AM  Depression screen PHQ 2/9  Decreased Interest 0 0 0 0 0 0 0  Down, Depressed, Hopeless 0 0 0 0 0 0 0  PHQ - 2 Score 0 0 0 0 0 0 0  Altered sleeping   0   0 0  Tired, decreased energy   0   0 0  Change in appetite   0   0 0  Feeling bad or failure about yourself    0   0 0  Trouble concentrating   0   0 0  Moving slowly or fidgety/restless   0   0 0  Suicidal thoughts   0   0 0  PHQ-9 Score   0   0 0  Difficult doing work/chores      Not difficult at all      Review of Systems  Constitutional: Negative.   HENT: Negative.    Eyes: Negative.   Respiratory: Negative.    Cardiovascular: Negative.   Gastrointestinal: Negative.   Endocrine: Negative.   Genitourinary: Negative.   Musculoskeletal:  Positive for back pain.  Skin: Negative.   Allergic/Immunologic: Negative.   Neurological: Negative.   Hematological: Negative.   Psychiatric/Behavioral:  Positive for sleep disturbance.       Objective:   Physical Exam Vitals and nursing note reviewed.  Constitutional:      Appearance: She is obese.  HENT:     Head: Normocephalic and atraumatic.  Eyes:     Extraocular Movements: Extraocular movements intact.     Conjunctiva/sclera: Conjunctivae normal.     Pupils: Pupils are equal, round, and reactive to light.  Musculoskeletal:     Comments: Limited internal rotation right hip pain with external rotation as well. With laying she does have mild flexion contracture at the right hip as well.  Skin:    General: Skin is warm and dry.  Neurological:     Mental Status: She is alert and oriented to person,  place, and time.     Comments: Sensation intact light touch bilateral lower extremities Negative straight leg raising bilaterally Motor strength 3/5 right hip 4/5 left hip 5/5 bilateral knee extensors and ankle dorsiflexor Deep tendon reflexes 1+ bilateral knees trace bilateral ankles   Psychiatric:        Mood and Affect: Mood normal.         Behavior: Behavior normal.   Positive thigh thrust on the right Positive tenderness over the PSIS on the right side        Assessment & Plan:  1.  History of lumbar spinal stenosis status post decompression and instrumentation L2-3 L3-4.  She has had multiple pain complaints including pain below the level of the fusion which is likely facet arthropathy at L4 or 5 L5-S1 versus sacroiliac disorder. She will continue physical therapy Continue hydrocodone as prescribed by primary physician  Right groin pain appears to be joint related on examination.  Will check x-ray.  May need diagnostic injection under fluoroscopic guidance.  Also in the differential would be nerve compression right upper lumbar L1-L2 area although she has no new sensory complaints with this.  I will see her again in 4 to 6 weeks

## 2021-09-17 ENCOUNTER — Other Ambulatory Visit: Payer: Self-pay | Admitting: Internal Medicine

## 2021-09-19 ENCOUNTER — Encounter (HOSPITAL_BASED_OUTPATIENT_CLINIC_OR_DEPARTMENT_OTHER): Payer: Self-pay | Admitting: Physical Therapy

## 2021-09-19 ENCOUNTER — Ambulatory Visit (HOSPITAL_BASED_OUTPATIENT_CLINIC_OR_DEPARTMENT_OTHER): Payer: Medicare HMO | Admitting: Physical Therapy

## 2021-09-19 DIAGNOSIS — R262 Difficulty in walking, not elsewhere classified: Secondary | ICD-10-CM

## 2021-09-19 DIAGNOSIS — M25551 Pain in right hip: Secondary | ICD-10-CM | POA: Diagnosis not present

## 2021-09-19 DIAGNOSIS — M5459 Other low back pain: Secondary | ICD-10-CM

## 2021-09-19 DIAGNOSIS — M6281 Muscle weakness (generalized): Secondary | ICD-10-CM

## 2021-09-19 NOTE — Therapy (Signed)
OUTPATIENT PHYSICAL THERAPY THORACOLUMBAR TREATMENT   Patient Name: [name] MRN: [id number] DOB:07/24/1945, 76 y.o., female Today's Date: 09/19/2021   PT End of Session - 09/19/21 1139     Visit Number 6    Number of Visits 18    Date for PT Re-Evaluation 11/17/21    Authorization Type [employer] MCR    PT Start Time 1100    PT Stop Time 1140    PT Time Calculation (min) 40 min    Activity Tolerance Patient tolerated treatment well;Patient limited by pain    Behavior During Therapy [facility] for tasks assessed/performed                Past Medical History:  Diagnosis Date   Arthritis    Cataract    both eyes rt. 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   Fibromyalgia    see rheumatology   GERD (gastroesophageal reflux disease)    PAST HX    Hyperlipidemia    Hypothyroidism    Migraine    Ocular rosacea 03/2009   on doxy   Osteoporosis    osteopenia   Pleuritic chest pain    etiology unclear, has beeb eval by pulmonary and rheumatology (autoimmune dz?)   Wrist fracture 2018   Past Surgical History:  Procedure Laterality Date   ANTERIOR LAT LUMBAR FUSION N/A 12/03/2019   Procedure: ANTERIOR LATERAL LUMBAR FUSION L2-4;  Surgeon: Melina Schools, MD;  Location: [place];  Service: Orthopedics;  Laterality: N/A;  4 hrs   COLONOSCOPY     COSMETIC SURGERY  04/12/2018   L upper eyelid ptosis repair, and trichophytic brow lift   ESOPHAGOGASTRODUODENOSCOPY (EGD) WITH PROPOFOL N/A 03/27/2013   Procedure: ESOPHAGOGASTRODUODENOSCOPY (EGD) WITH PROPOFOL;  Surgeon: Milus Banister, MD;  Location: [facility];  Service: Endoscopy;  Laterality: N/A;  possible dil   EYE SURGERY     growth on cornea-bil.   plastic surgery face  2013   POLYPECTOMY     right rotator cuff repair  2010    TONSILLECTOMY     UPPER GASTROINTESTINAL ENDOSCOPY     Patient Active Problem List   Diagnosis Date Noted   Fusion of lumbar spine 12/03/2019   COPD (chronic obstructive pulmonary disease) ([place]) 08/22/2019   Spinal stenosis of lumbar region with neurogenic claudication 11/22/2018   Coronary artery disease due to lipid rich plaque 10/03/2018   Closed fracture of distal end of radius 04/16/2017   Chronic back pain 03/15/2017   PCP NOTES >>>>>>>>>>>>>>>>>>>> 05/24/2015   Numbness and tingling of right arm 05/22/2015   Insomnia 09/01/2014   GERD (gastroesophageal reflux disease) 03/27/2013   Dysphagia, pharyngoesophageal phase 03/27/2013   Annual physical exam 06/12/2011   Sinusitis, chronic 02/02/2011   Osteoporosis 06/08/2010   Fibromyalgia, pain mngmt 01/29/2009   LIPOMA 09/30/2008   Migraine without aura 03/04/2007   Hypothyroidism 05/15/2006   COLONIC POLYPS, HX OF 05/15/2006    PCP:  Kathlene November, MD  REFERRING PROVIDER:  Alysia Penna, MD  REFERRING DIAG:  [phone] (ICD-10-CM) - Spondylosis without myelopathy or radiculopathy, lumbar region  M96.1 (ICD-10-CM) - Lumbar post-laminectomy syndrome    THERAPY DIAG:  Other low back pain  Muscle weakness (generalized)  Difficulty walking  ONSET DATE: March 2023  SUBJECTIVE:                                                                                                                                                                                           SUBJECTIVE STATEMENT:  Pt states she is in more pain today after testing on her hip. She states that stairs and her foot hitting the ground, she will feel an initial "jolt" of pain.    Previous Episode:   Pt has R sided hip and thigh pain and weakness.  Pt describes pain as achey and deep within the hip. Pain does not cross knee. Current 4/10, Best 0/10, Worst 9/10. Pt states the pain started about 6 months ago. She went to PT at [facility] from Oct to Dec in 2021 and  did not get better. She stated it started to decline. She then went to [facility] for PT again. She reports it was still getting worse and got a steroid injection. She states she is here for aquatic therapy. Pt denies NT into the R LE. Aggs: stairs, pivoting, sleeping on L side, R LE WB, getting in and out of cars, walking without support, sitting too long; Eases: sitting/resting, ice, meds. Denies pain with cough, sneezing, laughing. Pt states she sits for most of the day ~ 8 hrs. Pt does currently walk for 15 mins a day currently for exercise. Pt states she is not doing previous PT exercises. Pt denies unexpected weight loss but will have night pain after increased activity.   PERTINENT HISTORY:  wrist fx, pleuritic chest pain, osteoporosis, migraine, hypothyroidism, glaucoma, fibromyalgia, R RTC repair, anterior lateral lumbar fusion L2-12 Nov 2019   PAIN:  Are you having pain? Yes: NPRS scale: 7/10 Pain location: R  hip, L/S, R SIJ Pain description: tightness, strained muscle, pulled muscle Aggravating factors: bending, lifting, stairs, sleeping, sitting, standing too long, tighter pants Relieving factors: ice, stretching, pain pill   PRECAUTIONS: None  WEIGHT BEARING RESTRICTIONS No  FALLS:  Has patient fallen in last 6 months? No  LIVING ENVIRONMENT: Lives with: lives with their family and lives with their spouse Lives in: House/apartment Stairs: Yes 2 story home Has following equipment at home: None  OCCUPATION: retired  PLOF: Independent with basic ADLs  PATIENT GOALS : Pt states she would like to be able to reduce constant pain   OBJECTIVE:   DIAGNOSTIC FINDINGS:  IMPRESSION: 1. Interval L2-3 and L3-4 PLIF with probable solid arthrodesis. No recurrent impingement. There has been progression of degenerative disease at the remaining open lumbar levels. Mild discogenic edema at L1-2 and L5-S1. 2. L1-2 high-grade spinal stenosis and moderate left foraminal  narrowing. 3.  L4-5 high-grade spinal stenosis. 4. L5-S1 right more than left subarticular recess narrowing. Moderate bilateral foraminal narrowing.  PATIENT SURVEYS:  FOTO 45 49 at DC  LUMBAR ROM:   Active  A/PROM  08/19/2021  Flexion 75%  Extension 25% p!  Right lateral flexion 50%p1  Left lateral flexion 50%  Right rotation 50%  Left rotation 50%   (Blank rows = not tested)  LE MMT:  myotomal weakness of R LE noted at hip, knee, and ankle eversion, 4/5 quickly fatiguing  MMT Right 08/19/2021 Left 08/19/2021  Hip flexion myotomal weakness 4+/5  Hip extension    Hip abduction myotomal weakness 4+/5  Hip adduction myotomal weakness 4+/5  Knee flexion myotomal weakness 4+/5  Knee extension myotomal weakness 4+/5  Ankle dorsiflexion 4+/5 4+/5  Ankle eversion myotomal weakness 4+/5   (Blank rows = not tested)  FUNCTIONAL TESTS:  5 times sit to stand: 19.1s  eval   TODAY'S TREATMENT   6/12   Pt seen for aquatic therapy today.  Treatment took place in water 3.25-4 ft in depth at the [facility] pool. Temp of water was 92.  Pt entered/exited the pool via stairs (step to) independently with bilat rail.   Warm up: 3x sidestepping, fwd, and retro   Exercises:  SKTC against wall 5s 10x Standing hip hinge at wall 2x10 with ab brace Standing lumbar flexion at wall with noodle 10x each Noodle resisted trunk and hip rotation 2x10 Standing hip ABD 3x10 each marching laps with yellow noodle 3x laps Standing mini squat 2x10  Combined lumbar flexion and SB 10x each   Walking laps between exercise for active recovery   Pt requires buoyancy for support and to offload joints with strengthening exercises. Viscosity of the water is needed for resistance of strengthening; water current perturbations provides challenge to standing balance unsupported, requiring increased core activation. 6/8   Pt seen for aquatic therapy today.  Treatment took place in water 3.25-4 ft in depth at the  [facility] pool. Temp of water was 92.  Pt entered/exited the pool via stairs (step to) independently with bilat rail.   Warm up: 3x sidestepping, fwd, and retro   Exercises:  Standing hip hinge at wall 2x10 with ab brace Standing lumbar flexion and rotation with noodle 10x each Standing board press and row with ab brace 2x20 Kickboard resisted trunk and hip rotation 2x10 Marching 3x pool width with kickboard support  Standing hip flexion with LAQ 2x10 each Standing noodle supported squat 3x10    Walking laps between exercise for active recovery    Pt requires buoyancy for support and to offload joints with strengthening exercises. Viscosity of the water is needed for resistance of strengthening; water current perturbations provides challenge to standing balance unsupported, requiring increased core activation.   6/5   Pt seen for aquatic therapy today.  Treatment took place in water 3.25-4 ft in depth at the [facility] pool. Temp of water was 92.  Pt entered/exited the pool via stairs (step to) independently with bilat rail.   Warm up: 4x sidestepping   Exercises:  Standing hip hinge at wall 2x10 with ab brace Standing lumbar flexion and rotation with noodle 10x each Standing board press and row with ab brace 2x10 Marching 3x pool width with kickboard support  Standing hip flexion with LAQ 2x10 each Standing mini squat 2x10  Tandem balance 30s 3x each side  Walking laps between exercise for active recovery  Pt requires buoyancy for support and to offload joints with strengthening exercises. Viscosity of the water is needed for resistance of strengthening; water current perturbations provides challenge to standing balance unsupported, requiring increased core activation.   PATIENT EDUCATION:  Education details: exercise progression, muscle firing,  envelope of function, HEP, POC  Person educated: Patient Education method: Explanation,  Demonstration, Tactile cues, Verbal cues, and Handouts Education comprehension: verbalized understanding, returned demonstration, verbal cues required, and tactile cues required   HOME EXERCISE PROGRAM: Access Code: [email or web address] URL: [email or web address] Date: 08/19/2021 Prepared by: Daleen Bo   ASSESSMENT:  CLINICAL IMPRESSION:  Despite presenting with increased pain, pt able to continue with functional mobility exercise and progress exercise and stability without increasing pain. Pt does have improvement with combined hip and lumbar flexion. Minimal discomfort with pure hip extension but pain with compensated lumbar extension. Due to history of stenosis and potential lumbar facet joint arthropathy, pt likely to continue with flexion bias. Pt was able to introduce general R LE stability exercise in the aquatic setting, but does require intermittent UE assist due to R hip motor control deficits. Consider progression of functional mobility and STS/step up tasks for R LE strengthening. Pt is progressing with aquatic therapy sessions.  Pt would benefit from continued skilled therapy in order to reach goals and maximize functional R hip strength and ROM for prevention of further functional decline.    OBJECTIVE IMPAIRMENTS Abnormal gait, decreased activity tolerance, decreased balance, decreased endurance, decreased knowledge of condition, decreased mobility, difficulty walking, decreased ROM, decreased strength, hypomobility, increased muscle spasms, impaired flexibility, improper body mechanics, postural dysfunction, and pain.   ACTIVITY LIMITATIONS cleaning, community activity, driving, occupation, laundry, yard work, shopping, and exercise .   PERSONAL FACTORS Age, Behavior pattern, Fitness, Past/current experiences, Time since onset of injury/illness/exacerbation, and 1-2 comorbidities:    are also affecting patient's functional outcome.    REHAB POTENTIAL: Fair    CLINICAL  DECISION MAKING: Evolving/moderate complexity  EVALUATION COMPLEXITY: Moderate   GOALS:   SHORT TERM GOALS: Target date: 09/30/2021    Pt will become independent with HEP in order to demonstrate synthesis of PT education.  Baseline: Goal status: INITIAL  2.  Pt will be able to demonstrate STS with full hip extension in order to demonstrate functional improvement in R hip strength and ROM for self-care and house hold duties.  Baseline:  Goal status: INITIAL  3.  Pt will score at least 5 pt increase on FOTO to demonstrate functional improvement in MCII and pt perceived function.   Baseline:  Goal status: INITIAL   LONG TERM GOALS: Target date: 11/11/2021   Pt will become independent with final HEP in order to demonstrate synthesis of PT education.  Baseline:  Goal status: INITIAL  2.  Pt will be able to perform 5XSTS in under 12 in order to demonstrate functional improvement above the cut off score for older adults.  Baseline:  Goal status: INITIAL  3.  Pt will be able to demonstrate ability to perform step to pattern on stairs without UE and pain in order to demonstrate functional improvement in L/S and LE function for self-care and house hold duties.  Baseline:  Goal status: INITIAL  4.  Pt will score >/= 49 on FOTO to demonstrate improvement in perceived L/S function.  Baseline:  Goal status: INITIAL    PLAN: PT FREQUENCY: 1-2x/week  PT DURATION: 12 weeks (likely D/C by 10 wks)  PLANNED INTERVENTIONS: Therapeutic exercises, Therapeutic activity, Neuromuscular re-education, Balance training, Gait  training, Patient/Family education, Joint manipulation, Joint mobilization, Stair training, Orthotic/Fit training, Aquatic Therapy, Dry Needling, Electrical stimulation, Spinal manipulation, Spinal mobilization, Cryotherapy, Moist heat, Compression bandaging, scar mobilization, Taping, Vasopneumatic device, Traction, Ultrasound, Ionotophoresis '4mg'$ /ml Dexamethasone, and Manual  therapy.  PLAN FOR NEXT SESSION: review HEP, flexion stretching,  general lumbopelvic stability/strength   Daleen Bo, PT 09/19/2021, 11:42 AM

## 2021-09-23 ENCOUNTER — Ambulatory Visit (HOSPITAL_BASED_OUTPATIENT_CLINIC_OR_DEPARTMENT_OTHER): Payer: Medicare HMO | Admitting: Physical Therapy

## 2021-09-23 ENCOUNTER — Encounter (HOSPITAL_BASED_OUTPATIENT_CLINIC_OR_DEPARTMENT_OTHER): Payer: Self-pay | Admitting: Physical Therapy

## 2021-09-23 DIAGNOSIS — M6281 Muscle weakness (generalized): Secondary | ICD-10-CM | POA: Diagnosis not present

## 2021-09-23 DIAGNOSIS — M5459 Other low back pain: Secondary | ICD-10-CM

## 2021-09-23 DIAGNOSIS — R262 Difficulty in walking, not elsewhere classified: Secondary | ICD-10-CM

## 2021-09-23 DIAGNOSIS — M25551 Pain in right hip: Secondary | ICD-10-CM | POA: Diagnosis not present

## 2021-09-23 NOTE — Therapy (Signed)
OUTPATIENT PHYSICAL THERAPY THORACOLUMBAR TREATMENT   Patient Name: [name] MRN: [id number] DOB:[birth year], 76 y.o., female Today's Date: 09/23/2021   PT End of Session - 09/23/21 1102     Visit Number 7    Number of Visits 18    Date for PT Re-Evaluation 11/17/21    Authorization Type Aetna MCR    PT Start Time 1015    PT Stop Time 1055    PT Time Calculation (min) 40 min    Activity Tolerance Patient tolerated treatment well;Patient limited by pain    Behavior During Therapy [facility] for tasks assessed/performed                 Past Medical History:  Diagnosis Date   Arthritis    Cataract    both eyes rt. 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   Fibromyalgia    see rheumatology   GERD (gastroesophageal reflux disease)    PAST HX    Hyperlipidemia    Hypothyroidism    Migraine    Ocular rosacea 03/2009   on doxy   Osteoporosis    osteopenia   Pleuritic chest pain    etiology unclear, has beeb eval by pulmonary and rheumatology (autoimmune dz?)   Wrist fracture 2018   Past Surgical History:  Procedure Laterality Date   ANTERIOR LAT LUMBAR FUSION N/A 12/03/2019   Procedure: ANTERIOR LATERAL LUMBAR FUSION L2-4;  Surgeon: Melina Schools, MD;  Location: [facility];  Service: Orthopedics;  Laterality: N/A;  4 hrs   COLONOSCOPY     COSMETIC SURGERY  04/12/2018   L upper eyelid ptosis repair, and trichophytic brow lift   ESOPHAGOGASTRODUODENOSCOPY (EGD) WITH PROPOFOL N/A 03/27/2013   Procedure: ESOPHAGOGASTRODUODENOSCOPY (EGD) WITH PROPOFOL;  Surgeon: Milus Banister, MD;  Location: [facility];  Service: Endoscopy;  Laterality: N/A;  possible dil   EYE SURGERY     growth on cornea-bil.   plastic surgery face  2013   POLYPECTOMY     right rotator cuff repair  2010    TONSILLECTOMY     UPPER GASTROINTESTINAL ENDOSCOPY     Patient Active Problem List   Diagnosis Date Noted   Fusion of lumbar spine 12/03/2019   COPD (chronic obstructive pulmonary disease) ([place]) 08/22/2019   Spinal stenosis of lumbar region with neurogenic claudication 11/22/2018   Coronary artery disease due to lipid rich plaque 10/03/2018   Closed fracture of distal end of radius 04/16/2017   Chronic back pain 03/15/2017   PCP NOTES >>>>>>>>>>>>>>>>>>>> 05/24/2015   Numbness and tingling of right arm 05/22/2015   Insomnia 09/01/2014   GERD (gastroesophageal reflux disease) 03/27/2013   Dysphagia, pharyngoesophageal phase 03/27/2013   Annual physical exam 06/12/2011   Sinusitis, chronic 02/02/2011   Osteoporosis 06/08/2010   Fibromyalgia, pain mngmt 01/29/2009   LIPOMA 09/30/2008   Migraine without aura 03/04/2007   Hypothyroidism 05/15/2006   COLONIC POLYPS, HX OF 05/15/2006    PCP:  Kathlene November, MD  REFERRING PROVIDER:  Alysia Penna, MD  REFERRING DIAG:  [phone] (ICD-10-CM) - Spondylosis without myelopathy or radiculopathy, lumbar region  M96.1 (ICD-10-CM) - Lumbar post-laminectomy syndrome    THERAPY DIAG:  Other low back pain  Muscle weakness (generalized)  Difficulty walking  Pain in right hip  ONSET DATE: March 2023  SUBJECTIVE:                                                                                                                                                                                           SUBJECTIVE STATEMENT:  Pt states that her hip feels better today. It is not as painful and the last aquatic session helped to reduce her pain. Pt will have to have a 2 wk gap due to scheduling conflicts.    Previous Episode:   Pt has R sided hip and thigh pain and weakness.  Pt describes pain as achey and deep within the hip. Pain does not cross knee. Current 4/10, Best 0/10, Worst 9/10. Pt states the pain started about 6 months ago. She went to  PT at [facility] from Oct to Dec in 2021 and did not get better. She stated it started to decline. She then went to [facility] for PT again. She reports it was still getting worse and got a steroid injection. She states she is here for aquatic therapy. Pt denies NT into the R LE. Aggs: stairs, pivoting, sleeping on L side, R LE WB, getting in and out of cars, walking without support, sitting too long; Eases: sitting/resting, ice, meds. Denies pain with cough, sneezing, laughing. Pt states she sits for most of the day ~ 8 hrs. Pt does currently walk for 15 mins a day currently for exercise. Pt states she is not doing previous PT exercises. Pt denies unexpected weight loss but will have night pain after increased activity.   PERTINENT HISTORY:  wrist fx, pleuritic chest pain, osteoporosis, migraine, hypothyroidism, glaucoma, fibromyalgia, R RTC repair, anterior lateral lumbar fusion L2-12 Nov 2019   PAIN:  Are you having pain? Yes: NPRS scale: 4/10 Pain location: R  hip, L/S, R SIJ Pain description: tightness, strained muscle, pulled muscle Aggravating factors: bending, lifting, stairs, sleeping, sitting, standing too long, tighter pants Relieving factors: ice, stretching, pain pill   PRECAUTIONS: None  WEIGHT BEARING RESTRICTIONS No  FALLS:  Has patient fallen in last 6 months? No  LIVING ENVIRONMENT: Lives with: lives with their family and lives with their spouse Lives in: House/apartment Stairs: Yes 2 story home Has following equipment at home: None  OCCUPATION: retired  PLOF: Independent with basic ADLs  PATIENT GOALS : Pt states she would like to be able to reduce constant pain   OBJECTIVE:   DIAGNOSTIC FINDINGS:  IMPRESSION: 1. Interval L2-3 and L3-4 PLIF with probable solid arthrodesis. No recurrent impingement. There has been progression of degenerative disease at the remaining open lumbar levels. Mild discogenic edema at L1-2 and  L5-S1. 2. L1-2 high-grade spinal stenosis and  moderate left foraminal narrowing. 3. L4-5 high-grade spinal stenosis. 4. L5-S1 right more than left subarticular recess narrowing. Moderate bilateral foraminal narrowing.  PATIENT SURVEYS:  FOTO 45 49 at DC  LUMBAR ROM:   Active  A/PROM  08/19/2021  Flexion 75%  Extension 25% p!  Right lateral flexion 50%p1  Left lateral flexion 50%  Right rotation 50%  Left rotation 50%   (Blank rows = not tested)  LE MMT:  myotomal weakness of R LE noted at hip, knee, and ankle eversion, 4/5 quickly fatiguing  MMT Right 08/19/2021 Left 08/19/2021  Hip flexion myotomal weakness 4+/5  Hip extension    Hip abduction myotomal weakness 4+/5  Hip adduction myotomal weakness 4+/5  Knee flexion myotomal weakness 4+/5  Knee extension myotomal weakness 4+/5  Ankle dorsiflexion 4+/5 4+/5  Ankle eversion myotomal weakness 4+/5   (Blank rows = not tested)  FUNCTIONAL TESTS:  5 times sit to stand: 19.1s  eval   TODAY'S TREATMENT   6/16   Pt seen for aquatic therapy today.  Treatment took place in water 3.25-4 ft in depth at the [facility] pool. Temp of water was 92.  Pt entered/exited the pool via stairs (step to) independently with bilat rail.   Warm up: 3x sidestepping, fwd, and retro   Exercises:  SKTC against wall 5s 10x Standing hip hinge at wall 2x10 with ab brace Standing lumbar flexion at wall with noodle 3s 10x each Noodle resisted trunk and hip rotation 2x10 Standing hip ABD 3x10 each marching laps with yellow noodle 3x laps Standing mini squat 2x10 (focus on ab brace and hip ext)  Combined lumbar flexion and SB 10x each Kickboard press and row 2x20  STS from bench 2x10  Walking laps between exercise for active recovery   Pt requires buoyancy for support and to offload joints with strengthening exercises. Viscosity of the water is needed for resistance of strengthening; water current perturbations provides challenge to standing balance unsupported, requiring  increased core activation.  6/12   Pt seen for aquatic therapy today.  Treatment took place in water 3.25-4 ft in depth at the [facility] pool. Temp of water was 92.  Pt entered/exited the pool via stairs (step to) independently with bilat rail.   Warm up: 3x sidestepping, fwd, and retro   Exercises:  SKTC against wall 5s 10x Standing hip hinge at wall 2x10 with ab brace Standing lumbar flexion at wall with noodle 10x each Noodle resisted trunk and hip rotation 2x10 Standing hip ABD 3x10 each marching laps with yellow noodle 3x laps Standing mini squat 2x10  Combined lumbar flexion and SB 10x each   Walking laps between exercise for active recovery   Pt requires buoyancy for support and to offload joints with strengthening exercises. Viscosity of the water is needed for resistance of strengthening; water current perturbations provides challenge to standing balance unsupported, requiring increased core activation. 6/8   Pt seen for aquatic therapy today.  Treatment took place in water 3.25-4 ft in depth at the [facility] pool. Temp of water was 92.  Pt entered/exited the pool via stairs (step to) independently with bilat rail.   Warm up: 3x sidestepping, fwd, and retro   Exercises:  Standing hip hinge at wall 2x10 with ab brace Standing lumbar flexion and rotation with noodle 10x each Standing board press and row with ab brace 2x20 Kickboard resisted trunk and hip rotation 2x10 Marching 3x pool width with  kickboard support  Standing hip flexion with LAQ 2x10 each Standing noodle supported squat 3x10    Walking laps between exercise for active recovery    Pt requires buoyancy for support and to offload joints with strengthening exercises. Viscosity of the water is needed for resistance of strengthening; water current perturbations provides challenge to standing balance unsupported, requiring increased core activation.   6/5   Pt seen for aquatic  therapy today.  Treatment took place in water 3.25-4 ft in depth at the [facility] pool. Temp of water was 92.  Pt entered/exited the pool via stairs (step to) independently with bilat rail.   Warm up: 4x sidestepping   Exercises:  Standing hip hinge at wall 2x10 with ab brace Standing lumbar flexion and rotation with noodle 10x each Standing board press and row with ab brace 2x10 Marching 3x pool width with kickboard support  Standing hip flexion with LAQ 2x10 each Standing mini squat 2x10  Tandem balance 30s 3x each side  Walking laps between exercise for active recovery      Pt requires buoyancy for support and to offload joints with strengthening exercises. Viscosity of the water is needed for resistance of strengthening; water current perturbations provides challenge to standing balance unsupported, requiring increased core activation.   PATIENT EDUCATION:  Education details: exercise progression, muscle firing,  envelope of function, HEP, POC  Person educated: Patient Education method: Explanation, Demonstration, Tactile cues, Verbal cues, and Handouts Education comprehension: verbalized understanding, returned demonstration, verbal cues required, and tactile cues required   HOME EXERCISE PROGRAM: Access Code: [email or web address] URL: [email or web address] Date: 08/19/2021 Prepared by: Daleen Bo   ASSESSMENT:  CLINICAL IMPRESSION:  Pt able to progress with SL stability exercise a today's session on the pool setting. Pt with better managed pain today with isometric lumbopelvic strengthening and improving step length during gait following session. However, pt still requires VC for pelvic position as well as hip hinging motion with squatting. Pt given VC for R sided hip ext and WB during session likely due to R sided pain inhibition.  Pt able to introduce SL stability today with minimal irritation to the R. Pt is progressing with aquatic therapy sessions and  her functional mobility. Aquatic HEP provided electronically.   Pt would benefit from continued skilled therapy in order to reach goals and maximize functional R hip strength and ROM for prevention of further functional decline.    OBJECTIVE IMPAIRMENTS Abnormal gait, decreased activity tolerance, decreased balance, decreased endurance, decreased knowledge of condition, decreased mobility, difficulty walking, decreased ROM, decreased strength, hypomobility, increased muscle spasms, impaired flexibility, improper body mechanics, postural dysfunction, and pain.   ACTIVITY LIMITATIONS cleaning, community activity, driving, occupation, laundry, yard work, shopping, and exercise .   PERSONAL FACTORS Age, Behavior pattern, Fitness, Past/current experiences, Time since onset of injury/illness/exacerbation, and 1-2 comorbidities:    are also affecting patient's functional outcome.    REHAB POTENTIAL: Fair    CLINICAL DECISION MAKING: Evolving/moderate complexity  EVALUATION COMPLEXITY: Moderate   GOALS:   SHORT TERM GOALS: Target date: 09/30/2021    Pt will become independent with HEP in order to demonstrate synthesis of PT education.  Baseline: Goal status: INITIAL  2.  Pt will be able to demonstrate STS with full hip extension in order to demonstrate functional improvement in R hip strength and ROM for self-care and house hold duties.  Baseline:  Goal status: INITIAL  3.  Pt will score at least 5 pt increase on FOTO to demonstrate functional improvement  in [place] and pt perceived function.   Baseline:  Goal status: INITIAL   LONG TERM GOALS: Target date: 11/11/2021   Pt will become independent with final HEP in order to demonstrate synthesis of PT education.  Baseline:  Goal status: INITIAL  2.  Pt will be able to perform 5XSTS in under 12 in order to demonstrate functional improvement above the cut off score for older adults.  Baseline:  Goal status: INITIAL  3.  Pt will be able  to demonstrate ability to perform step to pattern on stairs without UE and pain in order to demonstrate functional improvement in L/S and LE function for self-care and house hold duties.  Baseline:  Goal status: INITIAL  4.  Pt will score >/= 49 on FOTO to demonstrate improvement in perceived L/S function.  Baseline:  Goal status: INITIAL    PLAN: PT FREQUENCY: 1-2x/week  PT DURATION: 12 weeks (likely D/C by 10 wks)  PLANNED INTERVENTIONS: Therapeutic exercises, Therapeutic activity, Neuromuscular re-education, Balance training, Gait training, Patient/Family education, Joint manipulation, Joint mobilization, Stair training, Orthotic/Fit training, Aquatic Therapy, Dry Needling, Electrical stimulation, Spinal manipulation, Spinal mobilization, Cryotherapy, Moist heat, Compression bandaging, scar mobilization, Taping, Vasopneumatic device, Traction, Ultrasound, Ionotophoresis '4mg'$ /ml Dexamethasone, and Manual therapy.  PLAN FOR NEXT SESSION: review HEP, flexion stretching,  general lumbopelvic stability/strength   Daleen Bo, PT 09/23/2021, 11:03 AM

## 2021-09-26 NOTE — Telephone Encounter (Signed)
Pt ready for scheduling on or after 10/08/21  Out-of-pocket cost due at time of visit: $301  Primary: [employer] [place] co-insurance: 20% (approximately $276) Admin fee co-insurance: 20% (approximately $25)  Secondary: n/a [place] co-insurance:  Admin fee co-insurance:   Deductible: does not apply  Prior Auth: APPROVED PA# [id number] Valid: 04/05/21-04/04/22  ** This summary of benefits is an estimation of the patient's out-of-pocket cost. Exact cost may vary based on individual plan coverage.

## 2021-09-28 ENCOUNTER — Ambulatory Visit (HOSPITAL_BASED_OUTPATIENT_CLINIC_OR_DEPARTMENT_OTHER): Payer: Medicare HMO | Admitting: Physical Therapy

## 2021-09-28 DIAGNOSIS — H18413 Arcus senilis, bilateral: Secondary | ICD-10-CM | POA: Diagnosis not present

## 2021-09-28 DIAGNOSIS — H25042 Posterior subcapsular polar age-related cataract, left eye: Secondary | ICD-10-CM | POA: Diagnosis not present

## 2021-09-28 DIAGNOSIS — H2512 Age-related nuclear cataract, left eye: Secondary | ICD-10-CM | POA: Diagnosis not present

## 2021-09-28 DIAGNOSIS — Z961 Presence of intraocular lens: Secondary | ICD-10-CM | POA: Diagnosis not present

## 2021-09-30 ENCOUNTER — Ambulatory Visit (HOSPITAL_BASED_OUTPATIENT_CLINIC_OR_DEPARTMENT_OTHER)
Admission: RE | Admit: 2021-09-30 | Discharge: 2021-09-30 | Disposition: A | Discharge status: Home / Self Care | Payer: Medicare HMO | Source: Ambulatory Visit | Attending: Physical Medicine & Rehabilitation | Admitting: Physical Medicine & Rehabilitation

## 2021-09-30 DIAGNOSIS — Z981 Arthrodesis status: Secondary | ICD-10-CM | POA: Diagnosis not present

## 2021-09-30 DIAGNOSIS — M25551 Pain in right hip: Secondary | ICD-10-CM | POA: Insufficient documentation

## 2021-09-30 DIAGNOSIS — M1611 Unilateral primary osteoarthritis, right hip: Secondary | ICD-10-CM | POA: Diagnosis not present

## 2021-10-01 ENCOUNTER — Telehealth: Payer: Self-pay | Admitting: Internal Medicine

## 2021-10-03 ENCOUNTER — Other Ambulatory Visit (HOSPITAL_BASED_OUTPATIENT_CLINIC_OR_DEPARTMENT_OTHER): Payer: Self-pay

## 2021-10-03 MED ORDER — HYDROCODONE-ACETAMINOPHEN 7.5-325 MG PO TABS
1.0000 | ORAL_TABLET | Freq: Four times a day (QID) | ORAL | 0 refills | Status: DC | PRN
  Filled 2021-10-03: qty 120, 30d supply, fill #0

## 2021-10-03 NOTE — Telephone Encounter (Signed)
Pdmp ok, rx sent  ? ?

## 2021-10-04 ENCOUNTER — Ambulatory Visit (HOSPITAL_BASED_OUTPATIENT_CLINIC_OR_DEPARTMENT_OTHER): Payer: Medicare HMO | Admitting: Physical Therapy

## 2021-10-05 ENCOUNTER — Ambulatory Visit: Payer: Medicare HMO | Admitting: Internal Medicine

## 2021-10-06 ENCOUNTER — Ambulatory Visit (HOSPITAL_BASED_OUTPATIENT_CLINIC_OR_DEPARTMENT_OTHER): Payer: Medicare HMO | Admitting: Physical Therapy

## 2021-10-06 ENCOUNTER — Encounter (HOSPITAL_BASED_OUTPATIENT_CLINIC_OR_DEPARTMENT_OTHER): Payer: Self-pay | Admitting: Physical Therapy

## 2021-10-06 DIAGNOSIS — R262 Difficulty in walking, not elsewhere classified: Secondary | ICD-10-CM

## 2021-10-06 DIAGNOSIS — M6281 Muscle weakness (generalized): Secondary | ICD-10-CM | POA: Diagnosis not present

## 2021-10-06 DIAGNOSIS — M25551 Pain in right hip: Secondary | ICD-10-CM

## 2021-10-06 DIAGNOSIS — M5459 Other low back pain: Secondary | ICD-10-CM

## 2021-10-06 NOTE — Therapy (Signed)
OUTPATIENT PHYSICAL THERAPY THORACOLUMBAR TREATMENT   Patient Name: [name] MRN: [id number] DOB:1945/12/07, 76 y.o., female Today's Date: 10/06/2021   PT End of Session - 10/06/21 1207     Visit Number 8    Number of Visits 18    Date for PT Re-Evaluation 11/17/21    Authorization Type [employer] MCR    PT Start Time 2671    PT Stop Time 1244    PT Time Calculation (min) 43 min    Activity Tolerance Patient tolerated treatment well    Behavior During Therapy WFL for tasks assessed/performed                 Past Medical History:  Diagnosis Date   Arthritis    Cataract    both eyes rt. 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   Fibromyalgia    see rheumatology   GERD (gastroesophageal reflux disease)    PAST HX    Hyperlipidemia    Hypothyroidism    Migraine    Ocular rosacea 03/2009   on doxy   Osteoporosis    osteopenia   Pleuritic chest pain    etiology unclear, has beeb eval by pulmonary and rheumatology (autoimmune dz?)   Wrist fracture 2018   Past Surgical History:  Procedure Laterality Date   ANTERIOR LAT LUMBAR FUSION N/A 12/03/2019   Procedure: ANTERIOR LATERAL LUMBAR FUSION L2-4;  Surgeon: Melina Schools, MD;  Location: [facility];  Service: Orthopedics;  Laterality: N/A;  4 hrs   COLONOSCOPY     COSMETIC SURGERY  04/12/2018   L upper eyelid ptosis repair, and trichophytic brow lift   ESOPHAGOGASTRODUODENOSCOPY (EGD) WITH PROPOFOL N/A 03/27/2013   Procedure: ESOPHAGOGASTRODUODENOSCOPY (EGD) WITH PROPOFOL;  Surgeon: Milus Banister, MD;  Location: [facility];  Service: Endoscopy;  Laterality: N/A;  possible dil   EYE SURGERY     growth on cornea-bil.   plastic surgery face  2013   POLYPECTOMY     right rotator cuff repair  2010   TONSILLECTOMY     UPPER  GASTROINTESTINAL ENDOSCOPY     Patient Active Problem List   Diagnosis Date Noted   Fusion of lumbar spine 12/03/2019   COPD (chronic obstructive pulmonary disease) ([place]) 08/22/2019   Spinal stenosis of lumbar region with neurogenic claudication 11/22/2018   Coronary artery disease due to lipid rich plaque 10/03/2018   Closed fracture of distal end of radius 04/16/2017   Chronic back pain 03/15/2017   PCP NOTES >>>>>>>>>>>>>>>>>>>> 05/24/2015   Numbness and tingling of right arm 05/22/2015   Insomnia 09/01/2014   GERD (gastroesophageal reflux disease) 03/27/2013   Dysphagia, pharyngoesophageal phase 03/27/2013   Annual physical exam 06/12/2011   Sinusitis, chronic 02/02/2011   Osteoporosis 06/08/2010   Fibromyalgia, pain mngmt 01/29/2009   LIPOMA 09/30/2008   Migraine without aura 03/04/2007   Hypothyroidism 05/15/2006   COLONIC POLYPS, HX OF 05/15/2006    PCP:  Kathlene November, MD  REFERRING PROVIDER:  Alysia Penna, MD  REFERRING DIAG:  [phone] (ICD-10-CM) - Spondylosis without myelopathy or radiculopathy, lumbar region  M96.1 (ICD-10-CM) - Lumbar post-laminectomy syndrome    THERAPY DIAG:  Other low back pain  Muscle weakness (generalized)  Difficulty walking  Pain in right hip  ONSET DATE:  March 2023  SUBJECTIVE:                                                                                                                                                                                           SUBJECTIVE STATEMENT:  Pt states that she learned that she shouldn't go shopping after aquatic therapy; it was too much after a previous session.  She has had Re-Bath in her home the last few days.  She will have to shower upstairs until her bathroom is ready. She slept in bedroom upstairs and slept well on mattress; will be exploring mattress options in future.   PERTINENT HISTORY:  wrist fx, pleuritic chest pain, osteoporosis, migraine, hypothyroidism, glaucoma,  fibromyalgia, R RTC repair, anterior lateral lumbar fusion L2-12 Nov 2019   PAIN:  Are you having pain? Yes: NPRS scale: 4/10 Pain location: R  hip, L/S, R SIJ Pain description: tightness, strained muscle, pulled muscle Aggravating factors: bending, lifting, stairs, sleeping, sitting, standing too long, tighter pants Relieving factors: ice, stretching, pain pill   PRECAUTIONS: None  WEIGHT BEARING RESTRICTIONS No  FALLS:  Has patient fallen in last 6 months? No  LIVING ENVIRONMENT: Lives with: lives with their family and lives with their spouse Lives in: House/apartment Stairs: Yes 2 story home Has following equipment at home: None  OCCUPATION: retired  PLOF: Independent with basic ADLs  PATIENT GOALS : Pt states she would like to be able to reduce constant pain   OBJECTIVE:   DIAGNOSTIC FINDINGS:  IMPRESSION: 1. Interval L2-3 and L3-4 PLIF with probable solid arthrodesis. No recurrent impingement. There has been progression of degenerative disease at the remaining open lumbar levels. Mild discogenic edema at L1-2 and L5-S1. 2. L1-2 high-grade spinal stenosis and moderate left foraminal narrowing. 3. L4-5 high-grade spinal stenosis. 4. L5-S1 right more than left subarticular recess narrowing. Moderate bilateral foraminal narrowing.  PATIENT SURVEYS:  FOTO 45 49 at DC  LUMBAR ROM:   Active  A/PROM  08/19/2021  Flexion 75%  Extension 25% p!  Right lateral flexion 50%p1  Left lateral flexion 50%  Right rotation 50%  Left rotation 50%   (Blank rows = not tested)  LE MMT:  myotomal weakness of R LE noted at hip, knee, and ankle eversion, 4/5 quickly fatiguing  MMT Right 08/19/2021 Left 08/19/2021  Hip flexion myotomal weakness 4+/5  Hip extension    Hip abduction myotomal weakness 4+/5  Hip adduction myotomal weakness 4+/5  Knee flexion myotomal weakness 4+/5  Knee extension myotomal weakness 4+/5  Ankle dorsiflexion 4+/5 4+/5  Ankle eversion myotomal  weakness 4+/5   (Blank rows = not tested)  FUNCTIONAL TESTS:  5 times sit to stand: 19.1s  eval   TODAY'S TREATMENT   6/29 Pt seen for aquatic therapy today.  Treatment took place in water 3.25-4 ft in depth at the [facility] pool. Temp of water was 92.  Pt entered/exited the pool via stairs (step to) independently with bilat rail.   Warm up: 3x sidestepping, fwd, and retro   Exercises:  SKTC against wall 5s 3x each LE Squats x 10 Hip openers x 10 (limited tolerance with RLE) (hip abdct/knee flexion) HIp/knee flexion crossing midline x 12 Standing hip ABD 2x10 each Hip ext x 10 each Stork stance with UE on wall with hip IR/ER Thin sqoodle press down towards thighs x 20  Kick board push / pull  at 10, 12, 2 o'clock in staggered stance (kick board 1/2 submerged)  STS with feet on blue step from bench x10  Seated piriformis stretch x 2 reps each side   Pt requires buoyancy for support and to offload joints with strengthening exercises. Viscosity of the water is needed for resistance of strengthening; water current perturbations provides challenge to standing balance unsupported, requiring increased core activation.  6/16 Pt seen for aquatic therapy today.  Treatment took place in water 3.25-4 ft in depth at the [facility] pool. Temp of water was 92.  Pt entered/exited the pool via stairs (step to) independently with bilat rail.   Warm up: 3x sidestepping, fwd, and retro Exercises:  SKTC against wall 5s 10x Standing hip hinge at wall 2x10 with ab brace Standing lumbar flexion at wall with noodle 3s 10x each Noodle resisted trunk and hip rotation 2x10 Standing hip ABD 3x10 each marching laps with yellow noodle 3x laps Standing mini squat 2x10 (focus on ab brace and hip ext)  Combined lumbar flexion and SB 10x each Kickboard press and row 2x20  STS from bench 2x10  Walking laps between exercise for active recovery   Pt requires buoyancy for support and  to offload joints with strengthening exercises. Viscosity of the water is needed for resistance of strengthening; water current perturbations provides challenge to standing balance unsupported, requiring increased core activation.  6/12 Pt seen for aquatic therapy today.  Treatment took place in water 3.25-4 ft in depth at the [facility] pool. Temp of water was 92.  Pt entered/exited the pool via stairs (step to) independently with bilat rail.   Warm up: 3x sidestepping, fwd, and retro Exercises:  SKTC against wall 5s 10x Standing hip hinge at wall 2x10 with ab brace Standing lumbar flexion at wall with noodle 10x each Noodle resisted trunk and hip rotation 2x10 Standing hip ABD 3x10 each marching laps with yellow noodle 3x laps Standing mini squat 2x10  Combined lumbar flexion and SB 10x each Walking laps between exercise for active recovery   Pt requires buoyancy for support and to offload joints with strengthening exercises. Viscosity of the water is needed for resistance of strengthening; water current perturbations provides challenge to standing balance unsupported, requiring increased core activation.  6/8 Pt seen for aquatic therapy today.  Treatment took place in water 3.25-4 ft in depth at the [facility] pool. Temp of water was 92.  Pt entered/exited the pool via stairs (step to) independently with bilat rail.   Warm up: 3x sidestepping, fwd, and retro Exercises:  Standing hip hinge at wall 2x10 with ab brace Standing lumbar flexion and rotation with noodle 10x each Standing board press and row with ab brace 2x20 Kickboard resisted  trunk and hip rotation 2x10 Marching 3x pool width with kickboard support  Standing hip flexion with LAQ 2x10 each Standing noodle supported squat 3x10  Walking laps between exercise for active recovery Pt requires buoyancy for support and to offload joints with strengthening exercises. Viscosity of the water is needed for  resistance of strengthening; water current perturbations provides challenge to standing balance unsupported, requiring increased core activation.   6/5 Pt seen for aquatic therapy today.  Treatment took place in water 3.25-4 ft in depth at the [facility] pool. Temp of water was 92.  Pt entered/exited the pool via stairs (step to) independently with bilat rail.   Warm up: 4x sidestepping Exercises:  Standing hip hinge at wall 2x10 with ab brace Standing lumbar flexion and rotation with noodle 10x each Standing board press and row with ab brace 2x10 Marching 3x pool width with kickboard support  Standing hip flexion with LAQ 2x10 each Standing mini squat 2x10  Tandem balance 30s 3x each side  Walking laps between exercise for active recovery      Pt requires buoyancy for support and to offload joints with strengthening exercises. Viscosity of the water is needed for resistance of strengthening; water current perturbations provides challenge to standing balance unsupported, requiring increased core activation.   PATIENT EDUCATION:  Education details: exercise progression, muscle firing,  envelope of function, HEP, POC  Person educated: Patient Education method: Explanation, Demonstration, Tactile cues, Verbal cues, and Handouts Education comprehension: verbalized understanding, returned demonstration, verbal cues required, and tactile cues required   HOME EXERCISE PROGRAM: Access Code: [email or web address] URL: [email or web address] Date: 08/19/2021 Prepared by: Daleen Bo   ASSESSMENT:  CLINICAL IMPRESSION: Pt reports increased Rt hip discomfort with Rt side stepping; eased with change in direction. She requires occasional cues for more upright posture.  Limited tolerance for hip abdct with knee flexion. She demonstrated decreased balance while in staggered stance position.  She reported reduction of Rt hip pain while in pool.  Pt seeking facility to complete HEP at  that is close to her home/ within budget, as she reports good relief of symptoms after exercising in pool  Pt progressing towards all goals.   Pt would benefit from continued skilled therapy in order to reach goals and maximize functional R hip strength and ROM for prevention of further functional decline.    OBJECTIVE IMPAIRMENTS Abnormal gait, decreased activity tolerance, decreased balance, decreased endurance, decreased knowledge of condition, decreased mobility, difficulty walking, decreased ROM, decreased strength, hypomobility, increased muscle spasms, impaired flexibility, improper body mechanics, postural dysfunction, and pain.   ACTIVITY LIMITATIONS cleaning, community activity, driving, occupation, laundry, yard work, shopping, and exercise .   PERSONAL FACTORS Age, Behavior pattern, Fitness, Past/current experiences, Time since onset of injury/illness/exacerbation, and 1-2 comorbidities:    are also affecting patient's functional outcome.    REHAB POTENTIAL: Fair    CLINICAL DECISION MAKING: Evolving/moderate complexity  EVALUATION COMPLEXITY: Moderate   GOALS:   SHORT TERM GOALS: Target date: 09/30/2021    Pt will become independent with HEP in order to demonstrate synthesis of PT education.  Baseline: Goal status: INITIAL  2.  Pt will be able to demonstrate STS with full hip extension in order to demonstrate functional improvement in R hip strength and ROM for self-care and house hold duties.  Baseline:  Goal status: INITIAL  3.  Pt will score at least 5 pt increase on FOTO to demonstrate functional improvement in MCII and pt perceived function.   Baseline:  Goal status:  INITIAL   LONG TERM GOALS: Target date: 11/11/2021   Pt will become independent with final HEP in order to demonstrate synthesis of PT education.  Baseline:  Goal status: INITIAL  2.  Pt will be able to perform 5XSTS in under 12 in order to demonstrate functional improvement above the cut off  score for older adults.  Baseline:  Goal status: INITIAL  3.  Pt will be able to demonstrate ability to perform step to pattern on stairs without UE and pain in order to demonstrate functional improvement in L/S and LE function for self-care and house hold duties.  Baseline:  Goal status: INITIAL  4.  Pt will score >/= 49 on FOTO to demonstrate improvement in perceived L/S function.  Baseline:  Goal status: INITIAL    PLAN: PT FREQUENCY: 1-2x/week  PT DURATION: 12 weeks (likely D/C by 10 wks)  PLANNED INTERVENTIONS: Therapeutic exercises, Therapeutic activity, Neuromuscular re-education, Balance training, Gait training, Patient/Family education, Joint manipulation, Joint mobilization, Stair training, Orthotic/Fit training, Aquatic Therapy, Dry Needling, Electrical stimulation, Spinal manipulation, Spinal mobilization, Cryotherapy, Moist heat, Compression bandaging, scar mobilization, Taping, Vasopneumatic device, Traction, Ultrasound, Ionotophoresis '4mg'$ /ml Dexamethasone, and Manual therapy.  PLAN FOR NEXT SESSION: review HEP, flexion stretching,  general lumbopelvic stability/strength.  Assess STGs.   Kerin Perna, PTA 10/06/21 1:00 PM

## 2021-10-07 ENCOUNTER — Ambulatory Visit (AMBULATORY_SURGERY_CENTER): Payer: Medicare HMO | Admitting: Gastroenterology

## 2021-10-07 ENCOUNTER — Encounter: Payer: Self-pay | Admitting: Gastroenterology

## 2021-10-07 VITALS — BP 109/57 | HR 62 | Temp 97.8°F | Resp 10 | Ht 64.0 in | Wt 187.0 lb

## 2021-10-07 DIAGNOSIS — J449 Chronic obstructive pulmonary disease, unspecified: Secondary | ICD-10-CM | POA: Diagnosis not present

## 2021-10-07 DIAGNOSIS — R1310 Dysphagia, unspecified: Secondary | ICD-10-CM | POA: Diagnosis not present

## 2021-10-07 DIAGNOSIS — I10 Essential (primary) hypertension: Secondary | ICD-10-CM | POA: Diagnosis not present

## 2021-10-07 DIAGNOSIS — K209 Esophagitis, unspecified without bleeding: Secondary | ICD-10-CM | POA: Diagnosis not present

## 2021-10-07 MED ORDER — OMEPRAZOLE 40 MG PO CPDR: DELAYED_RELEASE_CAPSULE | ORAL | 3 refills | Status: DC

## 2021-10-07 MED ORDER — SODIUM CHLORIDE 0.9 % IV SOLN: 500.0000 mL | Freq: Once | INTRAVENOUS | Status: DC

## 2021-10-07 NOTE — Progress Notes (Signed)
Report to PACU, RN, vss, BBS= Clear.  

## 2021-10-07 NOTE — Op Note (Signed)
[facility] Patient Name: [name] Procedure Date: 10/07/2021 9:38 AM MRN: [id number] Endoscopist: Milus Banister , MD Age: 76 Referring MD:  Date of Birth: 12/18/1945 Gender: Female Account #: [id number] Procedure:                Upper GI endoscopy Indications:              Dysphagia Medicines:                Monitored Anesthesia Care Procedure:                Pre-Anesthesia Assessment:                           - Prior to the procedure, a History and Physical                            was performed, and patient medications and                            allergies were reviewed. The patient's tolerance of                            previous anesthesia was also reviewed. The risks                            and benefits of the procedure and the sedation                            options and risks were discussed with the patient.                            All questions were answered, and informed consent                            was obtained. Prior Anticoagulants: The patient has                            taken no previous anticoagulant or antiplatelet                            agents. ASA Grade Assessment: II - A patient with                            mild systemic disease. After reviewing the risks                            and benefits, the patient was deemed in                            satisfactory condition to undergo the procedure.                           After obtaining informed consent, the endoscope was  passed under direct vision. Throughout the                            procedure, the patient's blood pressure, pulse, and                            oxygen saturations were monitored continuously. The                            Endoscope was introduced through the mouth, and                            advanced to the second part of duodenum. The upper                            GI endoscopy was accomplished without  difficulty.                            The patient tolerated the procedure well. Scope In: Scope Out: Findings:                 Biopsies were taken with a cold forceps from the                            proximal and distal esophagus to check for                            Eosinophilic Esophagitis.                           The esophagus was normal.                           The stomach was normal.                           The examined duodenum was normal. Complications:            No immediate complications. Estimated blood loss:                            None. Estimated Blood Loss:     Estimated blood loss: none. Impression:               - Normal esophagus.                           - Normal stomach.                           - Normal examined duodenum.                           - Biopsies were taken with a cold forceps from the                            proximal and distal esophagus to check for  Eosinophilic Esophagitis. Recommendation:           - Patient has a contact number available for                            emergencies. The signs and symptoms of potential                            delayed complications were discussed with the                            patient. Return to normal activities tomorrow.                            Written discharge instructions were provided to the                            patient.                           - Resume previous diet.                           - Continue present medications. New prescription                            today for omeprazole '40mg'$  pills, one pill 20-30 min                            before breakfast meal daily. disp 3 months, 3                            refills.                           - Await pathology results. Milus Banister, MD 10/07/2021 9:56:20 AM This report has been signed electronically.

## 2021-10-07 NOTE — Patient Instructions (Addendum)
START Omeprazole 40 mg, one pill 20-30 min before breakfast meal daily.  YOU HAD AN ENDOSCOPIC PROCEDURE TODAY AT [facility]:   Refer to the procedure report that was given to you for any specific questions about what was found during the examination.  If the procedure report does not answer your questions, please call your gastroenterologist to clarify.  If you requested that your care partner not be given the details of your procedure findings, then the procedure report has been included in a sealed envelope for you to review at your convenience later.  YOU SHOULD EXPECT: Some feelings of bloating in the abdomen. Passage of more gas than usual.  Walking can help get rid of the air that was put into your GI tract during the procedure and reduce the bloating. If you had a lower endoscopy (such as a colonoscopy or flexible sigmoidoscopy) you may notice spotting of blood in your stool or on the toilet paper. If you underwent a bowel prep for your procedure, you may not have a normal bowel movement for a few days.  Please Note:  You might notice some irritation and congestion in your nose or some drainage.  This is from the oxygen used during your procedure.  There is no need for concern and it should clear up in a day or so.  SYMPTOMS TO REPORT IMMEDIATELY:  Following upper endoscopy (EGD)  Vomiting of blood or coffee ground material  New chest pain or pain under the shoulder blades  Painful or persistently difficult swallowing  New shortness of breath  Fever of 100F or higher  Black, tarry-looking stools  For urgent or emergent issues, a gastroenterologist can be reached at any hour by calling [phone]. Do not use MyChart messaging for urgent concerns.    DIET:  We do recommend a small meal at first, but then you may proceed to your regular diet.  Drink plenty of fluids but you should avoid alcoholic beverages for 24 hours.  ACTIVITY:  You should plan to take it easy  for the rest of today and you should NOT DRIVE or use heavy machinery until tomorrow (because of the sedation medicines used during the test).    FOLLOW UP: Our staff will call the number listed on your records the next business day following your procedure.  We will call around 7:15- 8:00 am to check on you and address any questions or concerns that you may have regarding the information given to you following your procedure. If we do not reach you, we will leave a message.  If you develop any symptoms (ie: fever, flu-like symptoms, shortness of breath, cough etc.) before then, please call [phone].  If you test positive for Covid 19 in the 2 weeks post procedure, please call and report this information to [place].    If any biopsies were taken you will be contacted by phone or by letter within the next 1-3 weeks.  Please call us at [phone] if you have not heard about the biopsies in 3 weeks.    SIGNATURES/CONFIDENTIALITY: You and/or your care partner have signed paperwork which will be entered into your electronic medical record.  These signatures attest to the fact that that the information above on your After Visit Summary has been reviewed and is understood.  Full responsibility of the confidentiality of this discharge information lies with you and/or your care-partner.

## 2021-10-07 NOTE — Progress Notes (Signed)
Called to room to assist during endoscopic procedure.  Patient ID and intended procedure confirmed with present staff. Received instructions for my participation in the procedure from the performing physician.  

## 2021-10-07 NOTE — Progress Notes (Signed)
HPI: This is a woman with dysphagia   ROS: complete GI ROS as described in HPI, all other review negative.  Constitutional:  No unintentional weight loss   Past Medical History:  Diagnosis Date   Arthritis    Cataract    both eyes rt. 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;  Service: Orthopedics;  Laterality: N/A;  4 hrs   COLONOSCOPY     COSMETIC SURGERY  04/12/2018   L upper eyelid ptosis repair, and trichophytic brow lift   ESOPHAGOGASTRODUODENOSCOPY (EGD) WITH PROPOFOL N/A 03/27/2013   Procedure: ESOPHAGOGASTRODUODENOSCOPY (EGD) WITH PROPOFOL;  Surgeon: Milus Banister, MD;  Location: [facility];  Service: Endoscopy;  Laterality: N/A;  possible dil   EYE SURGERY     growth on cornea-bil.   plastic surgery face  2013   POLYPECTOMY     right rotator cuff repair  2010   TONSILLECTOMY     UPPER GASTROINTESTINAL ENDOSCOPY      Current Outpatient Medications  Medication Sig Dispense Refill   ALPRAZolam (XANAX) 0.5 MG tablet TAKE 1 TABLET BY MOUTH AT BEDTIME AS NEEDED FOR ANXIETY 30 tablet 3   aspirin 81 MG EC tablet Take 81 mg by mouth daily. Swallow whole.     calcium carbonate (OS-CAL) 1250 (500 CA) MG chewable tablet Chew 1 tablet by mouth 2 (two) times  daily.      Cholecalciferol (VITAMIN D3) 50 MCG (2000 UT) CHEW Chew 2,000 Units by mouth daily.      cycloSPORINE (RESTASIS) 0.05 % ophthalmic emulsion 1 drop 2 (two) times daily.     Estradiol (YUVAFEM) 10 MCG TABS vaginal tablet PLACE 1 TABLET VAGINALLY 2 TIMES A WEEK. 8 tablet 11   fluticasone-salmeterol (ADVAIR) 100-50 MCG/ACT AEPB Inhale 1 puff into the lungs at bedtime as needed. 60 each 5   hydrochlorothiazide (MICROZIDE) 12.5 MG capsule Take 1 capsule by mouth daily.     HYDROcodone-acetaminophen (NORCO) 7.5-325 MG tablet Take 1 tablet by mouth every 6 (six) hours as needed for moderate pain. 120 tablet 0   hydroxypropyl methylcellulose / hypromellose (ISOPTO TEARS / GONIOVISC) 2.5 % ophthalmic solution Place 1 drop into both eyes 3 (three) times daily as needed for dry eyes.     levothyroxine (SYNTHROID) 88 MCG tablet TAKE 1 TABLET BY MOUTH EVERY DAY BEFORE BREAKFAST 90 tablet 1   Multiple Vitamin (MULTIVITAMIN) capsule Take 1 capsule by mouth daily.       pravastatin (PRAVACHOL) 20 MG tablet TAKE 1 TABLET BY MOUTH EVERYDAY AT BEDTIME 90 tablet 1   vitamin E 1000 UNIT capsule vitamin E     denosumab (PROLIA) 60  MG/ML SOSY injection Inject 60 mg into the skin every 6 (six) months. 1 mL 0   naloxone (NARCAN) 4 MG/0.1ML LIQD nasal spray kit Use as directed 2 each 0   Probiotic Product (PROBIOTIC-10) CHEW Chew 1 tablet by mouth daily. (Patient not taking: Reported on 10/07/2021)     Current Facility-Administered Medications  Medication Dose Route Frequency Provider Last Rate Last Admin   0.9 %  sodium chloride infusion  500 mL Intravenous Once Milus Banister, MD        Allergies as of 10/07/2021 - Review Complete 10/07/2021  Allergen Reaction Noted   Alendronate sodium Hives 04/27/2006   Bactrim [sulfamethoxazole-trimethoprim]  01/25/2012   Contrast media [iodinated contrast media] Hives 05/22/2015   Iodine Hives 05/22/2015   Milnacipran  08/13/2008   Penicillins Hives and Other  (See Comments) 04/27/2006    Family History  Adopted: Yes  Problem Relation Age of Onset   Cancer Neg Hx    Depression Neg Hx    Diabetes Neg Hx    Stroke Neg Hx    Hypertension Neg Hx    Heart disease Neg Hx     Social History   Socioeconomic History   Marital status: Married    Spouse name: Not on file   Number of children: 0   Years of education: Not on file   Highest education level: Not on file  Occupational History   Occupation: retired, 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  Tobacco Use   Smoking status: Former    Packs/day: 1.00    Years: 38.50    Total pack years: 38.50    Types: Cigarettes    Quit date: 01/13/2004    Years since quitting: 17.7   Smokeless tobacco: Never   Tobacco comments:    quit 2005  Vaping Use   Vaping Use: Never used  Substance and Sexual Activity   Alcohol use: No    Alcohol/week: 0.0 standard drinks of alcohol    Comment: rare   Drug use: No   Sexual activity: Not Currently    Comment: first intercourse >16  Other Topics Concern   Not on file  Social History Narrative   No biological children      Social Determinants of Health   Financial Resource Strain: Low Risk  (08/18/2021)   Overall Financial Resource Strain (CARDIA)    Difficulty of Paying Living Expenses: Not hard at all  Food Insecurity: No Food Insecurity (08/18/2021)   Hunger Vital Sign    Worried About Running Out of Food in the Last Year: Never true    [place] in the Last Year: Never true  Transportation Needs: No Transportation Needs (08/18/2021)   PRAPARE - Hydrologist (Medical): No    Lack of Transportation (Non-Medical): No  Physical Activity: Sufficiently Active (08/18/2021)   Exercise Vital Sign    Days of Exercise per Week: 7 days    Minutes of Exercise per Session: 30 min  Stress: No Stress Concern Present (08/18/2021)   [place]    Feeling of Stress  : Not at all  Social Connections: Moderately Integrated (08/18/2021)   Social Connection and Isolation Panel [NHANES]    Frequency of Communication with Friends and Family: More than three times a week    Frequency of Social Gatherings with Friends and Family: More than three times a week    Attends Religious Services: Never  Active Member of Clubs or Organizations: Yes    Attends Archivist Meetings: More than 4 times per year    Marital Status: Married  Human resources officer Violence: Not At Risk (08/18/2021)   Humiliation, Afraid, Rape, and Kick questionnaire    Fear of Current or Ex-Partner: No    Emotionally Abused: No    Physically Abused: No    Sexually Abused: No     Physical Exam: BP 127/64   Pulse 66   Temp 97.8 F (36.6 C)   Ht 5' 4" (1.626 m)   Wt 187 lb (84.8 kg)   LMP 04/10/1998 (Approximate)   SpO2 98%   BMI 32.10 kg/m  Constitutional: generally well-appearing Psychiatric: alert and oriented x3 Lungs: CTA bilaterally Heart: no MCR  Assessment and plan: 76 y.o. female with dysphagia  EGD today  Care is appropriate for the ambulatory setting.  Owens Loffler, MD [facility] 10/07/2021, 9:35 AM

## 2021-10-10 ENCOUNTER — Telehealth: Payer: Self-pay | Admitting: *Deleted

## 2021-10-10 NOTE — Telephone Encounter (Signed)
No answer on first attempt follow up call. Left message.  ?

## 2021-10-12 ENCOUNTER — Ambulatory Visit: Payer: Medicare HMO

## 2021-10-12 DIAGNOSIS — M810 Age-related osteoporosis without current pathological fracture: Secondary | ICD-10-CM

## 2021-10-12 MED ORDER — DENOSUMAB 60 MG/ML ~~LOC~~ SOSY
60.0000 mg | PREFILLED_SYRINGE | Freq: Once | SUBCUTANEOUS | Status: AC
  Administered 2021-10-12: 60 mg via SUBCUTANEOUS

## 2021-10-12 NOTE — Progress Notes (Signed)
After obtaining consent, and per orders of Dr. Cruzita Lederer, injection of Prolia '60mg'$  given by Erastus Bartolomei L Rafi Kenneth. Patient instructed to remain in clinic for 20 minutes afterwards, and to report any adverse reaction to me immediately.

## 2021-10-13 ENCOUNTER — Ambulatory Visit: Payer: Medicare HMO | Admitting: Internal Medicine

## 2021-10-14 ENCOUNTER — Ambulatory Visit (HOSPITAL_BASED_OUTPATIENT_CLINIC_OR_DEPARTMENT_OTHER): Payer: Medicare HMO | Admitting: Physical Therapy

## 2021-10-18 ENCOUNTER — Encounter (HOSPITAL_BASED_OUTPATIENT_CLINIC_OR_DEPARTMENT_OTHER): Payer: Medicare HMO | Admitting: Physical Therapy

## 2021-10-18 ENCOUNTER — Telehealth: Payer: Self-pay | Admitting: Internal Medicine

## 2021-10-18 NOTE — Telephone Encounter (Signed)
Requesting: alprazolam 0.'5mg'$   Contract: 01/29/20 UDS:  03/24/21 Last Visit: 07/26/21 Next Visit: 11/10/21 Last Refill: 06/21/21 #30 and 3RF  Please Advise

## 2021-10-19 ENCOUNTER — Ambulatory Visit (HOSPITAL_BASED_OUTPATIENT_CLINIC_OR_DEPARTMENT_OTHER): Payer: Medicare HMO | Admitting: Physical Therapy

## 2021-10-19 NOTE — Telephone Encounter (Signed)
PDMP okay, Rx sent 

## 2021-10-21 ENCOUNTER — Ambulatory Visit (HOSPITAL_BASED_OUTPATIENT_CLINIC_OR_DEPARTMENT_OTHER): Payer: Medicare HMO | Attending: Physical Medicine & Rehabilitation | Admitting: Physical Therapy

## 2021-10-21 ENCOUNTER — Encounter: Payer: Self-pay | Admitting: Internal Medicine

## 2021-10-21 ENCOUNTER — Encounter (HOSPITAL_BASED_OUTPATIENT_CLINIC_OR_DEPARTMENT_OTHER): Payer: Self-pay | Admitting: Physical Therapy

## 2021-10-21 ENCOUNTER — Ambulatory Visit: Payer: Medicare HMO | Admitting: Internal Medicine

## 2021-10-21 VITALS — BP 130/82 | HR 69 | Ht 64.0 in | Wt 191.0 lb

## 2021-10-21 DIAGNOSIS — M5459 Other low back pain: Secondary | ICD-10-CM | POA: Insufficient documentation

## 2021-10-21 DIAGNOSIS — M6281 Muscle weakness (generalized): Secondary | ICD-10-CM | POA: Diagnosis not present

## 2021-10-21 DIAGNOSIS — M810 Age-related osteoporosis without current pathological fracture: Secondary | ICD-10-CM | POA: Diagnosis not present

## 2021-10-21 DIAGNOSIS — R262 Difficulty in walking, not elsewhere classified: Secondary | ICD-10-CM | POA: Diagnosis not present

## 2021-10-21 DIAGNOSIS — M25551 Pain in right hip: Secondary | ICD-10-CM | POA: Insufficient documentation

## 2021-10-21 DIAGNOSIS — E039 Hypothyroidism, unspecified: Secondary | ICD-10-CM | POA: Diagnosis not present

## 2021-10-21 LAB — VITAMIN D 25 HYDROXY (VIT D DEFICIENCY, FRACTURES): VITD: 44.87 ng/mL (ref 30.00–100.00)
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
## 2021-10-21 NOTE — Patient Instructions (Addendum)
Please stop at the lab.  Please continue Prolia, vitamin D and calcium.  Continue levothyroxine 88 mcg daily.  Take the thyroid hormone every day, with water, at least 30 minutes before breakfast, separated by at least 4 hours from: - acid reflux medications - calcium - iron - multivitamins  Please come back for a follow-up appointment in 1 year.

## 2021-10-21 NOTE — Therapy (Signed)
OUTPATIENT PHYSICAL THERAPY PROGRESS NOTE Progress Note Reporting Period 08/19/21 to 10/21/21  See note below for Objective Data and Assessment of Progress/Goals.      Patient Name: [name] MRN: [id number] DOB:[birth year], 76 y.o., female Today's Date: 10/21/2021   PT End of Session - 10/21/21 1110     Visit Number 9    Number of Visits 18    Date for PT Re-Evaluation 11/17/21    Authorization Type [employer] MCR    PT Start Time 1100    PT Stop Time 1140    PT Time Calculation (min) 40 min    Activity Tolerance Patient tolerated treatment well    Behavior During Therapy WFL for tasks assessed/performed                  Past Medical History:  Diagnosis Date   Arthritis    Cataract    both eyes rt. eye was removed   Chest pain    stress test @ [place] [facility] (-)   Colonic polyp    bx adenomatous polyps, next scope 2010   COPD (chronic obstructive pulmonary disease) ([place])    Cornea disorder    NODULES REMOVED FROM CORNEA   Fibromyalgia    see rheumatology   GERD (gastroesophageal reflux disease)    PAST HX    Hyperlipidemia    Hypothyroidism    Migraine    Ocular rosacea 03/2009   on doxy   Osteoporosis    osteopenia   Pleuritic chest pain    etiology unclear, has beeb eval by pulmonary and rheumatology (autoimmune dz?)   Wrist fracture 2018   Past Surgical History:  Procedure Laterality Date   ANTERIOR LAT LUMBAR FUSION N/A 12/03/2019   Procedure: ANTERIOR LATERAL LUMBAR FUSION L2-4;  Surgeon: Melina Schools, MD;  Location: [facility];  Service: Orthopedics;  Laterality: N/A;  4 hrs   COLONOSCOPY     COSMETIC SURGERY  04/12/2018   L upper eyelid ptosis repair, and trichophytic brow lift   ESOPHAGOGASTRODUODENOSCOPY (EGD) WITH PROPOFOL N/A 03/27/2013   Procedure: ESOPHAGOGASTRODUODENOSCOPY (EGD) WITH PROPOFOL;  Surgeon: Milus Banister, MD;  Location: [facility];  Service: Endoscopy;  Laterality: N/A;  possible dil   EYE SURGERY     growth on  cornea-bil.   plastic surgery face  2013   POLYPECTOMY     right rotator cuff repair  2010   TONSILLECTOMY     UPPER GASTROINTESTINAL ENDOSCOPY     Patient Active Problem List   Diagnosis Date Noted   Fusion of lumbar spine 12/03/2019   COPD (chronic obstructive pulmonary disease) ([place]) 08/22/2019   Spinal stenosis of lumbar region with neurogenic claudication 11/22/2018   Coronary artery disease due to lipid rich plaque 10/03/2018   Closed fracture of distal end of radius 04/16/2017   Chronic back pain 03/15/2017   PCP NOTES >>>>>>>>>>>>>>>>>>>> 05/24/2015   Numbness and tingling of right arm 05/22/2015   Insomnia 09/01/2014   GERD (gastroesophageal reflux disease) 03/27/2013   Dysphagia, pharyngoesophageal phase 03/27/2013   Annual physical exam 06/12/2011   Sinusitis, chronic 02/02/2011   Osteoporosis 06/08/2010   Fibromyalgia, pain mngmt 01/29/2009   LIPOMA 09/30/2008   Migraine without aura 03/04/2007   Hypothyroidism 05/15/2006   COLONIC POLYPS, HX OF 05/15/2006    PCP:  Kathlene November, MD  REFERRING PROVIDER:  Alysia Penna, MD  REFERRING DIAG:  [phone] (ICD-10-CM) - Spondylosis without myelopathy or radiculopathy, lumbar region  M96.1 (ICD-10-CM) - Lumbar post-laminectomy syndrome  THERAPY DIAG:  Other low back pain  Muscle weakness (generalized)  Difficulty walking  Pain in right hip  ONSET DATE: March 2023  SUBJECTIVE:                                                                                                                                                                                           SUBJECTIVE STATEMENT:  Pt states she has had a rough few weeks. She feels "carrying a ton of bricks around." Pt recently traveled and had house renovations that flared up her back." Pt is now going up and down stairs that she normally doesn't do.   Pt has joined the [employer] to do a [employer] "pool class." She plans to go 2x/week. She states she has had  mixed compliance with land based HEP.   PERTINENT HISTORY:  wrist fx, pleuritic chest pain, osteoporosis, migraine, hypothyroidism, glaucoma, fibromyalgia, R RTC repair, anterior lateral lumbar fusion L2-12 Nov 2019   PAIN:  Are you having pain? Yes: NPRS scale: 5/10 Pain location: R  hip, L/S, R SIJ Pain description: tightness, strained muscle, pulled muscle Aggravating factors: bending, lifting, stairs, sleeping, sitting, standing too long, tighter pants Relieving factors: ice, stretching, pain pill   PRECAUTIONS: None  WEIGHT BEARING RESTRICTIONS No  FALLS:  Has patient fallen in last 6 months? No  LIVING ENVIRONMENT: Lives with: lives with their family and lives with their spouse Lives in: House/apartment Stairs: Yes 2 story home Has following equipment at home: None  OCCUPATION: retired  PLOF: Independent with basic ADLs  PATIENT GOALS : Pt states she would like to be able to reduce constant pain   OBJECTIVE:   DIAGNOSTIC FINDINGS:  IMPRESSION: 1. Interval L2-3 and L3-4 PLIF with probable solid arthrodesis. No recurrent impingement. There has been progression of degenerative disease at the remaining open lumbar levels. Mild discogenic edema at L1-2 and L5-S1. 2. L1-2 high-grade spinal stenosis and moderate left foraminal narrowing. 3. L4-5 high-grade spinal stenosis. 4. L5-S1 right more than left subarticular recess narrowing. Moderate bilateral foraminal narrowing.  PATIENT SURVEYS:  FOTO 45 49 at DC  44 FOTO 7/14  LUMBAR ROM:   Active  A/PROM  08/19/2021 AROM 7/14  Flexion 75% 75%  Extension 25% p! 50% p!  Right lateral flexion 50%p! 75% p!  Left lateral flexion 50% 50% p!  Right rotation 50% 75%  Left rotation 50% 75%   (Blank rows = not tested)  LE MMT:  myotomal weakness of R LE noted at hip, knee, and ankle eversion, 4/5 quickly fatiguing  MMT Right 08/19/2021 Left 08/19/2021 R  7/14 L 7/14  Hip flexion  myotomal weakness 4+/5 4/5  fatiguing 4+/5  Hip abduction myotomal weakness 4+/5 4/5 fatiguing 4+/5  Hip adduction myotomal weakness 4+/5 4/5 fatiguing 4+/5  Knee flexion myotomal weakness 4+/5 4/5 fatiguing 4+/5  Knee extension myotomal weakness 4+/5 4/5 fatiguing 4+/5  Ankle dorsiflexion 4+/5 4+/5 4/5 fatiguing 4+/5  Ankle eversion myotomal weakness 4+/5 4/5 fatiguing 4+/5   (Blank rows = not tested)  FUNCTIONAL TESTS:  5 times sit to stand: 19.1s  eval  7/14 5XSTS 13.1s    TODAY'S TREATMENT   7/14  Full review of HEP, exam findings  Exercises - Seated Flexion Stretch with Swiss Grimley  - 2 x daily - 7 x weekly - 1 sets - 10 reps - 10 hold - Seated Quadratus Lumborum Stretch in Chair  - 2 x daily - 7 x weekly - 1 sets - 3 reps - 30 hold - Supine Posterior Pelvic Tilt  - 1 x daily - 7 x weekly - 2 sets - 10 reps - 2 hold - Supine Bridge  - 1 x daily - 7 x weekly - 2 sets - 10 reps - Supine Transversus Abdominis Bracing with Leg Extension  - 1 x daily - 7 x weekly - 2 sets - 10 reps - Side Stepping  - 1 x daily - 7 x weekly - 1 sets - 3 reps   6/29 Pt seen for aquatic therapy today.  Treatment took place in water 3.25-4 ft in depth at the [facility] pool. Temp of water was 92.  Pt entered/exited the pool via stairs (step to) independently with bilat rail.   Warm up: 3x sidestepping, fwd, and retro   Exercises:  SKTC against wall 5s 3x each LE Squats x 10 Hip openers x 10 (limited tolerance with RLE) (hip abdct/knee flexion) HIp/knee flexion crossing midline x 12 Standing hip ABD 2x10 each Hip ext x 10 each Stork stance with UE on wall with hip IR/ER Thin sqoodle press down towards thighs x 20  Kick board push / pull  at 10, 12, 2 o'clock in staggered stance (kick board 1/2 submerged)  STS with feet on blue step from bench x10  Seated piriformis stretch x 2 reps each side   Pt requires buoyancy for support and to offload joints with strengthening exercises. Viscosity of the water is  needed for resistance of strengthening; water current perturbations provides challenge to standing balance unsupported, requiring increased core activation.  6/16 Pt seen for aquatic therapy today.  Treatment took place in water 3.25-4 ft in depth at the [facility] pool. Temp of water was 92.  Pt entered/exited the pool via stairs (step to) independently with bilat rail.   Warm up: 3x sidestepping, fwd, and retro Exercises:  SKTC against wall 5s 10x Standing hip hinge at wall 2x10 with ab brace Standing lumbar flexion at wall with noodle 3s 10x each Noodle resisted trunk and hip rotation 2x10 Standing hip ABD 3x10 each marching laps with yellow noodle 3x laps Standing mini squat 2x10 (focus on ab brace and hip ext)  Combined lumbar flexion and SB 10x each Kickboard press and row 2x20  STS from bench 2x10  Walking laps between exercise for active recovery   Pt requires buoyancy for support and to offload joints with strengthening exercises. Viscosity of the water is needed for resistance of strengthening; water current perturbations provides challenge to standing balance unsupported, requiring increased core activation.  6/12 Pt seen for aquatic therapy today.  Treatment took place in water  3.25-4 ft in depth at the [facility] pool. Temp of water was 92.  Pt entered/exited the pool via stairs (step to) independently with bilat rail.   Warm up: 3x sidestepping, fwd, and retro Exercises:  SKTC against wall 5s 10x Standing hip hinge at wall 2x10 with ab brace Standing lumbar flexion at wall with noodle 10x each Noodle resisted trunk and hip rotation 2x10 Standing hip ABD 3x10 each marching laps with yellow noodle 3x laps Standing mini squat 2x10  Combined lumbar flexion and SB 10x each Walking laps between exercise for active recovery   Pt requires buoyancy for support and to offload joints with strengthening exercises. Viscosity of the water is needed for  resistance of strengthening; water current perturbations provides challenge to standing balance unsupported, requiring increased core activation.  6/8 Pt seen for aquatic therapy today.  Treatment took place in water 3.25-4 ft in depth at the [facility] pool. Temp of water was 92.  Pt entered/exited the pool via stairs (step to) independently with bilat rail.   Warm up: 3x sidestepping, fwd, and retro Exercises:  Standing hip hinge at wall 2x10 with ab brace Standing lumbar flexion and rotation with noodle 10x each Standing board press and row with ab brace 2x20 Kickboard resisted trunk and hip rotation 2x10 Marching 3x pool width with kickboard support  Standing hip flexion with LAQ 2x10 each Standing noodle supported squat 3x10  Walking laps between exercise for active recovery Pt requires buoyancy for support and to offload joints with strengthening exercises. Viscosity of the water is needed for resistance of strengthening; water current perturbations provides challenge to standing balance unsupported, requiring increased core activation.   6/5 Pt seen for aquatic therapy today.  Treatment took place in water 3.25-4 ft in depth at the [facility] pool. Temp of water was 92.  Pt entered/exited the pool via stairs (step to) independently with bilat rail.   Warm up: 4x sidestepping Exercises:  Standing hip hinge at wall 2x10 with ab brace Standing lumbar flexion and rotation with noodle 10x each Standing board press and row with ab brace 2x10 Marching 3x pool width with kickboard support  Standing hip flexion with LAQ 2x10 each Standing mini squat 2x10  Tandem balance 30s 3x each side  Walking laps between exercise for active recovery      Pt requires buoyancy for support and to offload joints with strengthening exercises. Viscosity of the water is needed for resistance of strengthening; water current perturbations provides challenge to standing balance  unsupported, requiring increased core activation.   PATIENT EDUCATION:  Education details: exam findings, exercise progression, muscle firing,  envelope of function, HEP, POC  Person educated: Patient Education method: Explanation, Demonstration, Tactile cues, Verbal cues, and Handouts Education comprehension: verbalized understanding, returned demonstration, verbal cues required, and tactile cues required   HOME EXERCISE PROGRAM: Access Code: [email or web address] URL: [email or web address] Date: 08/19/2021 Prepared by: Daleen Bo   ASSESSMENT:  CLINICAL IMPRESSION: Pt with improved ROM, R LE strength, and functional strength despite static score with FOTO. Pt is improving significantly in the aquatic environment and now has access to aquatic exercise options. Pt is still limited by R sided hip pain but mobility has improved since starting with PT. Pt is still most limited by R hip pain and strength. However, pt is open to decreasing freq of PT visits in order to establish pool HEP and transition to independent exercise. Plan to progress aquatic exercise weekly for taper from therapy services to independent exercise.  Pt would benefit from continued skilled therapy in order to reach goals and maximize functional R hip strength and ROM for prevention of further functional decline.    OBJECTIVE IMPAIRMENTS Abnormal gait, decreased activity tolerance, decreased balance, decreased endurance, decreased knowledge of condition, decreased mobility, difficulty walking, decreased ROM, decreased strength, hypomobility, increased muscle spasms, impaired flexibility, improper body mechanics, postural dysfunction, and pain.   ACTIVITY LIMITATIONS cleaning, community activity, driving, occupation, laundry, yard work, shopping, and exercise .   PERSONAL FACTORS Age, Behavior pattern, Fitness, Past/current experiences, Time since onset of injury/illness/exacerbation, and 1-2 comorbidities:    are also  affecting patient's functional outcome.    REHAB POTENTIAL: Fair    CLINICAL DECISION MAKING: Evolving/moderate complexity  EVALUATION COMPLEXITY: Moderate   GOALS:   SHORT TERM GOALS: Target date: 09/30/2021    Pt will become independent with HEP in order to demonstrate synthesis of PT education.  Baseline: Goal status: MET  2.  Pt will be able to demonstrate STS with full hip extension in order to demonstrate functional improvement in R hip strength and ROM for self-care and house hold duties.  Baseline:  Goal status: MET  3.  Pt will score at least 5 pt increase on FOTO to demonstrate functional improvement in MCII and pt perceived function.   Baseline:  Goal status: ongoing   LONG TERM GOALS: Target date: 11/11/2021   Pt will become independent with final HEP in order to demonstrate synthesis of PT education.  Baseline:  Goal status: ongoing  2.  Pt will be able to perform 5XSTS in under 12 in order to demonstrate functional improvement above the cut off score for older adults.  Baseline:  Goal status: ongoing  3.  Pt will be able to demonstrate ability to perform step to pattern on stairs without UE and pain in order to demonstrate functional improvement in L/S and LE function for self-care and house hold duties.  Baseline:  Goal status: ongoing  4.  Pt will score >/= 49 on FOTO to demonstrate improvement in perceived L/S function.  Baseline:  Goal status: ongoing    PLAN: PT FREQUENCY: 1-2x/week  PT DURATION: 12 weeks (likely D/C by 10 wks)  PLANNED INTERVENTIONS: Therapeutic exercises, Therapeutic activity, Neuromuscular re-education, Balance training, Gait training, Patient/Family education, Joint manipulation, Joint mobilization, Stair training, Orthotic/Fit training, Aquatic Therapy, Dry Needling, Electrical stimulation, Spinal manipulation, Spinal mobilization, Cryotherapy, Moist heat, Compression bandaging, scar mobilization, Taping, Vasopneumatic  device, Traction, Ultrasound, Ionotophoresis 42m/ml Dexamethasone, and Manual therapy.  PLAN FOR NEXT SESSION:  progress aquatic HEP weekly [facility] and R hip mobility, strength, general conditioning   ADaleen BoPT, DPT 10/21/21 11:52 AM  Addended 10/25/21 538p MStanton Kidney(FTharon Aquas [email or web address]

## 2021-10-23 ENCOUNTER — Other Ambulatory Visit: Payer: Self-pay | Admitting: Internal Medicine

## 2021-10-25 ENCOUNTER — Ambulatory Visit (HOSPITAL_BASED_OUTPATIENT_CLINIC_OR_DEPARTMENT_OTHER): Payer: Medicare HMO | Admitting: Physical Therapy

## 2021-10-25 ENCOUNTER — Encounter (HOSPITAL_BASED_OUTPATIENT_CLINIC_OR_DEPARTMENT_OTHER): Payer: Self-pay | Admitting: Physical Therapy

## 2021-10-25 DIAGNOSIS — M5459 Other low back pain: Secondary | ICD-10-CM

## 2021-10-25 DIAGNOSIS — M6281 Muscle weakness (generalized): Secondary | ICD-10-CM | POA: Diagnosis not present

## 2021-10-25 DIAGNOSIS — R262 Difficulty in walking, not elsewhere classified: Secondary | ICD-10-CM

## 2021-10-25 DIAGNOSIS — M25551 Pain in right hip: Secondary | ICD-10-CM | POA: Diagnosis not present

## 2021-10-25 NOTE — Therapy (Signed)
OUTPATIENT PHYSICAL THERAPY PROGRESS NOTE   Patient Name: [name] MRN: [id number] DOB:[birth year], 76 y.o., female Today's Date: 10/25/2021   PT End of Session - 10/25/21 1738     Visit Number 10    Number of Visits 18    Date for PT Re-Evaluation 11/17/21    Authorization Type [employer] MCR    Progress Note Due on Visit 64    PT Start Time 1615    PT Stop Time 1700    PT Time Calculation (min) 45 min    Activity Tolerance Patient tolerated treatment well    Behavior During Therapy WFL for tasks assessed/performed                   Past Medical History:  Diagnosis Date   Arthritis    Cataract    both eyes rt. 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   Fibromyalgia    see rheumatology   GERD (gastroesophageal reflux disease)    PAST HX    Hyperlipidemia    Hypothyroidism    Migraine    Ocular rosacea 03/2009   on doxy   Osteoporosis    osteopenia   Pleuritic chest pain    etiology unclear, has beeb eval by pulmonary and rheumatology (autoimmune dz?)   Wrist fracture 2018   Past Surgical History:  Procedure Laterality Date   ANTERIOR LAT LUMBAR FUSION N/A 12/03/2019   Procedure: ANTERIOR LATERAL LUMBAR FUSION L2-4;  Surgeon: Melina Schools, MD;  Location: [facility];  Service: Orthopedics;  Laterality: N/A;  4 hrs   COLONOSCOPY     COSMETIC SURGERY  04/12/2018   L upper eyelid ptosis repair, and trichophytic brow lift   ESOPHAGOGASTRODUODENOSCOPY (EGD) WITH PROPOFOL N/A 03/27/2013   Procedure: ESOPHAGOGASTRODUODENOSCOPY (EGD) WITH PROPOFOL;  Surgeon: Milus Banister, MD;  Location: [facility];  Service: Endoscopy;  Laterality: N/A;  possible dil   EYE SURGERY     growth on cornea-bil.   plastic surgery face  2013   POLYPECTOMY     right rotator cuff repair  2010    TONSILLECTOMY     UPPER GASTROINTESTINAL ENDOSCOPY     Patient Active Problem List   Diagnosis Date Noted   Fusion of lumbar spine 12/03/2019   COPD (chronic obstructive pulmonary disease) ([place]) 08/22/2019   Spinal stenosis of lumbar region with neurogenic claudication 11/22/2018   Coronary artery disease due to lipid rich plaque 10/03/2018   Closed fracture of distal end of radius 04/16/2017   Chronic back pain 03/15/2017   PCP NOTES >>>>>>>>>>>>>>>>>>>> 05/24/2015   Numbness and tingling of right arm 05/22/2015   Insomnia 09/01/2014   GERD (gastroesophageal reflux disease) 03/27/2013   Dysphagia, pharyngoesophageal phase 03/27/2013   Annual physical exam 06/12/2011   Sinusitis, chronic 02/02/2011   Osteoporosis 06/08/2010   Fibromyalgia, pain mngmt 01/29/2009   LIPOMA 09/30/2008   Migraine without aura 03/04/2007   Hypothyroidism 05/15/2006   COLONIC POLYPS, HX OF 05/15/2006    PCP:  Kathlene November, MD  REFERRING PROVIDER:  Alysia Penna, MD  REFERRING DIAG:  [phone] (ICD-10-CM) - Spondylosis without myelopathy or radiculopathy, lumbar region  M96.1 (ICD-10-CM) - Lumbar post-laminectomy syndrome    THERAPY DIAG:  Other low back pain  Muscle weakness (generalized)  Difficulty walking  ONSET DATE: March 2023  SUBJECTIVE:                                                                                                                                                                                           SUBJECTIVE STATEMENT:   "Back hurting, felt pretty good after last session, decreases pain then it comes right back."  Pt has joined the [employer] to do a [employer] "pool class." She plans to go 2x/week. She states she has had mixed compliance with land based HEP.   PERTINENT HISTORY:  wrist fx, pleuritic chest pain, osteoporosis, migraine, hypothyroidism, glaucoma, fibromyalgia, R RTC repair, anterior lateral lumbar fusion L2-12 Nov 2019   PAIN:  Are you having  pain? Yes: NPRS scale: 5/10 Pain location: R  hip, L/S, R SIJ Pain description: tightness, strained muscle, pulled muscle Aggravating factors: bending, lifting, stairs, sleeping, sitting, standing too long, tighter pants Relieving factors: ice, stretching, pain pill   PRECAUTIONS: None  WEIGHT BEARING RESTRICTIONS No  FALLS:  Has patient fallen in last 6 months? No  LIVING ENVIRONMENT: Lives with: lives with their family and lives with their spouse Lives in: House/apartment Stairs: Yes 2 story home Has following equipment at home: None  OCCUPATION: retired  PLOF: Independent with basic ADLs  PATIENT GOALS : Pt states she would like to be able to reduce constant pain   OBJECTIVE:   DIAGNOSTIC FINDINGS:  IMPRESSION: 1. Interval L2-3 and L3-4 PLIF with probable solid arthrodesis. No recurrent impingement. There has been progression of degenerative disease at the remaining open lumbar levels. Mild discogenic edema at L1-2 and L5-S1. 2. L1-2 high-grade spinal stenosis and moderate left foraminal narrowing. 3. L4-5 high-grade spinal stenosis. 4. L5-S1 right more than left subarticular recess narrowing. Moderate bilateral foraminal narrowing.  PATIENT SURVEYS:  FOTO 45 49 at DC  44 FOTO 7/14  LUMBAR ROM:   Active  A/PROM  08/19/2021 AROM 7/14  Flexion 75% 75%  Extension 25% p! 50% p!  Right lateral flexion 50%p! 75% p!  Left lateral flexion 50% 50% p!  Right rotation 50% 75%  Left rotation 50% 75%   (Blank rows = not tested)  LE MMT:  myotomal weakness of R LE noted at hip, knee, and ankle eversion, 4/5 quickly fatiguing  MMT Right 08/19/2021 Left 08/19/2021 R  7/14 L 7/14  Hip flexion myotomal weakness 4+/5 4/5 fatiguing 4+/5  Hip abduction myotomal weakness 4+/5 4/5 fatiguing 4+/5  Hip adduction myotomal weakness 4+/5 4/5 fatiguing 4+/5  Knee flexion myotomal weakness 4+/5 4/5 fatiguing 4+/5  Knee extension myotomal weakness 4+/5 4/5 fatiguing 4+/5  Ankle dorsiflexion 4+/5 4+/5 4/5 fatiguing 4+/5  Ankle eversion myotomal weakness 4+/5 4/5 fatiguing 4+/5   (Blank rows = not tested)  FUNCTIONAL TESTS:  5 times sit to stand: 19.1s  eval  7/14 5XSTS 13.1s    TODAY'S TREATMENT    10/25/21 Pt seen for aquatic therapy today.  Treatment took place in water 3.25-4 ft in depth at the [facility] pool. Temp of water was 92.  Pt entered/exited the pool via stairs (step to) independently with bilat rail.   Warm up: 3x sidestepping, fwd, and retro   Exercises:  LB, hamstring and gastroc stretch 2nd step holding to handrails Squats x 10 Hip openers x 10 (limited tolerance with RLE) (hip abdct/knee flexion) Standing hip ABD 2x10 each Hip ext x 10 each Stork stance with UE on wall and on noddle with hip IR/ER Thin sqoodle press down towards thighs x 20  Kick board push / pull  at 10, 12, 2 o'clock in staggered stance (kick board 1/2 submerged)  Straddling noodle 3 trials: cycling x 2 widths, scissoring 2x20, skii 2x20    Pt requires buoyancy for support and to offload joints with strengthening exercises. Viscosity of the water is needed for resistance of strengthening; water current perturbations provides challenge to standing balance unsupported, requiring increased core activation. 7/14  Full review of HEP, exam findings  Exercises - Seated Flexion Stretch with Swiss Kindall  - 2 x daily - 7 x weekly - 1 sets - 10 reps - 10 hold - Seated Quadratus Lumborum Stretch in Chair  - 2 x daily - 7 x weekly - 1 sets - 3 reps - 30 hold - Supine Posterior Pelvic Tilt  - 1 x daily - 7 x weekly - 2 sets - 10 reps - 2 hold - Supine Bridge  - 1 x daily - 7 x weekly - 2 sets - 10 reps - Supine Transversus Abdominis Bracing with Leg Extension  - 1 x daily - 7 x weekly - 2 sets - 10 reps - Side Stepping  - 1 x daily - 7 x weekly - 1 sets - 3 reps   6/29 Pt seen for aquatic therapy today.  Treatment took place in water 3.25-4 ft in depth at  the [facility] pool. Temp of water was 92.  Pt entered/exited the pool via stairs (step to) independently with bilat rail.   Warm up: 3x sidestepping, fwd, and retro   Exercises:  LB, hamstring and gastroc stretch 2nd step holding to handrails Squats x 10 Hip openers x 10 (limited tolerance with RLE) (hip abdct/knee flexion) Standing hip ABD 2x10 each Hip ext x 10 each Stork stance with UE on wall and on noddle with hip IR/ER Thin sqoodle press down towards thighs x 20  Kick board push / pull  at 10, 12, 2 o'clock in staggered stance (kick board 1/2 submerged)  Straddling noodle 3 trials: cycling x 2 widths, scissoring , skii    Pt requires buoyancy for support and to offload joints with strengthening exercises. Viscosity of the water is needed for resistance of strengthening; water current perturbations provides challenge to standing balance unsupported, requiring increased core activation.  6/16 Pt seen for aquatic therapy today.  Treatment took place in water 3.25-4 ft in depth at the [facility] pool. Temp of water was 92.  Pt entered/exited the pool via stairs (step to) independently with bilat rail.   Warm up: 3x sidestepping, fwd, and retro Exercises:  SKTC against wall  5s 10x Standing hip hinge at wall 2x10 with ab brace Standing lumbar flexion at wall with noodle 3s 10x each Noodle resisted trunk and hip rotation 2x10 Standing hip ABD 3x10 each marching laps with yellow noodle 3x laps Standing mini squat 2x10 (focus on ab brace and hip ext)  Combined lumbar flexion and SB 10x each Kickboard press and row 2x20  STS from bench 2x10  Walking laps between exercise for active recovery   Pt requires buoyancy for support and to offload joints with strengthening exercises. Viscosity of the water is needed for resistance of strengthening; water current perturbations provides challenge to standing balance unsupported, requiring increased core  activation.  6/12 Pt seen for aquatic therapy today.  Treatment took place in water 3.25-4 ft in depth at the [facility] pool. Temp of water was 92.  Pt entered/exited the pool via stairs (step to) independently with bilat rail.   Warm up: 3x sidestepping, fwd, and retro Exercises:  SKTC against wall 5s 10x Standing hip hinge at wall 2x10 with ab brace Standing lumbar flexion at wall with noodle 10x each Noodle resisted trunk and hip rotation 2x10 Standing hip ABD 3x10 each marching laps with yellow noodle 3x laps Standing mini squat 2x10  Combined lumbar flexion and SB 10x each Walking laps between exercise for active recovery   Pt requires buoyancy for support and to offload joints with strengthening exercises. Viscosity of the water is needed for resistance of strengthening; water current perturbations provides challenge to standing balance unsupported, requiring increased core activation.  6/8 Pt seen for aquatic therapy today.  Treatment took place in water 3.25-4 ft in depth at the [facility] pool. Temp of water was 92.  Pt entered/exited the pool via stairs (step to) independently with bilat rail.   Warm up: 3x sidestepping, fwd, and retro Exercises:  Standing hip hinge at wall 2x10 with ab brace Standing lumbar flexion and rotation with noodle 10x each Standing board press and row with ab brace 2x20 Kickboard resisted trunk and hip rotation 2x10 Marching 3x pool width with kickboard support  Standing hip flexion with LAQ 2x10 each Standing noodle supported squat 3x10  Walking laps between exercise for active recovery Pt requires buoyancy for support and to offload joints with strengthening exercises. Viscosity of the water is needed for resistance of strengthening; water current perturbations provides challenge to standing balance unsupported, requiring increased core activation.   6/5 Pt seen for aquatic therapy today.  Treatment took place in water  3.25-4 ft in depth at the [facility] pool. Temp of water was 92.  Pt entered/exited the pool via stairs (step to) independently with bilat rail.   Warm up: 4x sidestepping Exercises:  Standing hip hinge at wall 2x10 with ab brace Standing lumbar flexion and rotation with noodle 10x each Standing board press and row with ab brace 2x10 Marching 3x pool width with kickboard support  Standing hip flexion with LAQ 2x10 each Standing mini squat 2x10  Tandem balance 30s 3x each side  Walking laps between exercise for active recovery      Pt requires buoyancy for support and to offload joints with strengthening exercises. Viscosity of the water is needed for resistance of strengthening; water current perturbations provides challenge to standing balance unsupported, requiring increased core activation.   PATIENT EDUCATION:  Education details: exam findings, exercise progression, muscle firing,  envelope of function, HEP, POC  Person educated: Patient Education method: Explanation, Demonstration, Tactile cues, Verbal cues, and Handouts Education  comprehension: verbalized understanding, returned demonstration, verbal cues required, and tactile cues required   HOME EXERCISE PROGRAM: Access Code: [email or web address] URL: [email or web address] Date: 08/19/2021 Prepared by: Daleen Bo   ASSESSMENT:  CLINICAL IMPRESSION: Pt seen in aquatics today.  Completed exercises as instructed.  Added vertically suspended cycling which she has a very good response to decreasing R sided hip pain.  She has not yet gone to [facility] to use pool for aquatic HEP. Her aquatic HEP will be printed and lamented for her for next visit.  Goals ongoing  Pt with improved ROM, R LE strength, and functional strength despite static score with FOTO. Pt is improving significantly in the aquatic environment and now has access to aquatic exercise options. Pt is still limited by R sided hip pain but mobility has  improved since starting with PT. Pt is still most limited by R hip pain and strength. However, pt is open to decreasing freq of PT visits in order to establish pool HEP and transition to independent exercise. Plan to progress aquatic exercise weekly for taper from therapy services to independent exercise.   Pt would benefit from continued skilled therapy in order to reach goals and maximize functional R hip strength and ROM for prevention of further functional decline.    OBJECTIVE IMPAIRMENTS Abnormal gait, decreased activity tolerance, decreased balance, decreased endurance, decreased knowledge of condition, decreased mobility, difficulty walking, decreased ROM, decreased strength, hypomobility, increased muscle spasms, impaired flexibility, improper body mechanics, postural dysfunction, and pain.   ACTIVITY LIMITATIONS cleaning, community activity, driving, occupation, laundry, yard work, shopping, and exercise .   PERSONAL FACTORS Age, Behavior pattern, Fitness, Past/current experiences, Time since onset of injury/illness/exacerbation, and 1-2 comorbidities:    are also affecting patient's functional outcome.    REHAB POTENTIAL: Fair    CLINICAL DECISION MAKING: Evolving/moderate complexity  EVALUATION COMPLEXITY: Moderate   GOALS:   SHORT TERM GOALS: Target date: 09/30/2021    Pt will become independent with HEP in order to demonstrate synthesis of PT education.  Baseline: Goal status: MET  2.  Pt will be able to demonstrate STS with full hip extension in order to demonstrate functional improvement in R hip strength and ROM for self-care and house hold duties.  Baseline:  Goal status: MET  3.  Pt will score at least 5 pt increase on FOTO to demonstrate functional improvement in MCII and pt perceived function.   Baseline:  Goal status: ongoing   LONG TERM GOALS: Target date: 11/11/2021   Pt will become independent with final HEP in order to demonstrate synthesis of PT  education.  Baseline:  Goal status: ongoing  2.  Pt will be able to perform 5XSTS in under 12 in order to demonstrate functional improvement above the cut off score for older adults.  Baseline:  Goal status: ongoing  3.  Pt will be able to demonstrate ability to perform step to pattern on stairs without UE and pain in order to demonstrate functional improvement in L/S and LE function for self-care and house hold duties.  Baseline:  Goal status: ongoing  4.  Pt will score >/= 49 on FOTO to demonstrate improvement in perceived L/S function.  Baseline:  Goal status: ongoing    PLAN: PT FREQUENCY: 1-2x/week  PT DURATION: 12 weeks (likely D/C by 10 wks)  PLANNED INTERVENTIONS: Therapeutic exercises, Therapeutic activity, Neuromuscular re-education, Balance training, Gait training, Patient/Family education, Joint manipulation, Joint mobilization, Stair training, Orthotic/Fit training, Aquatic Therapy, Dry Needling, Electrical stimulation, Spinal manipulation, Spinal mobilization, Cryotherapy,  Moist heat, Compression bandaging, scar mobilization, Taping, Vasopneumatic device, Traction, Ultrasound, Ionotophoresis 108m/ml Dexamethasone, and Manual therapy.  PLAN FOR NEXT SESSION:  progress [facility] and [facility] hip mobility, strength, general conditioning   MAnnamarie Major Lorik Guo MPT 10/25/21 5:41 PM

## 2021-10-26 NOTE — Telephone Encounter (Signed)
Last Prolia inj 10/12/21 Next Prolia inj due 04/15/21

## 2021-10-27 ENCOUNTER — Other Ambulatory Visit: Payer: Self-pay | Admitting: Cardiovascular Disease

## 2021-11-01 ENCOUNTER — Telehealth: Payer: Self-pay | Admitting: Internal Medicine

## 2021-11-01 ENCOUNTER — Other Ambulatory Visit (HOSPITAL_BASED_OUTPATIENT_CLINIC_OR_DEPARTMENT_OTHER): Payer: Self-pay

## 2021-11-01 MED ORDER — HYDROCODONE-ACETAMINOPHEN 7.5-325 MG PO TABS
1.0000 | ORAL_TABLET | Freq: Four times a day (QID) | ORAL | 0 refills | Status: DC | PRN
  Filled 2021-11-01: qty 120, 30d supply, fill #0

## 2021-11-01 NOTE — Telephone Encounter (Signed)
Requesting: hydrocodone 7.5-'325mg'$   Contract: 01/29/20 UDS: 03/24/21 Last Visit: 07/26/21 Next Visit: 11/10/21  Last Refill: 10/03/21 #120 and 0RF  Please Advise

## 2021-11-01 NOTE — Telephone Encounter (Signed)
PDMP reviewed, prescription sent x2

## 2021-11-02 ENCOUNTER — Ambulatory Visit (INDEPENDENT_AMBULATORY_CARE_PROVIDER_SITE_OTHER): Payer: Medicare HMO | Admitting: Bariatrics

## 2021-11-04 ENCOUNTER — Encounter: Payer: Medicare HMO | Admitting: Physical Medicine & Rehabilitation

## 2021-11-10 ENCOUNTER — Encounter: Payer: Self-pay | Admitting: Internal Medicine

## 2021-11-10 ENCOUNTER — Ambulatory Visit (INDEPENDENT_AMBULATORY_CARE_PROVIDER_SITE_OTHER): Payer: Medicare HMO | Admitting: Internal Medicine

## 2021-11-10 VITALS — BP 122/80 | HR 67 | Temp 98.3°F | Resp 18 | Ht 64.0 in | Wt 186.0 lb

## 2021-11-10 DIAGNOSIS — G4700 Insomnia, unspecified: Secondary | ICD-10-CM | POA: Diagnosis not present

## 2021-11-10 DIAGNOSIS — M810 Age-related osteoporosis without current pathological fracture: Secondary | ICD-10-CM | POA: Diagnosis not present

## 2021-11-10 DIAGNOSIS — Z0000 Encounter for general adult medical examination without abnormal findings: Secondary | ICD-10-CM | POA: Diagnosis not present

## 2021-11-10 DIAGNOSIS — Z23 Encounter for immunization: Secondary | ICD-10-CM

## 2021-11-10 DIAGNOSIS — Z0001 Encounter for general adult medical examination with abnormal findings: Secondary | ICD-10-CM

## 2021-11-10 DIAGNOSIS — G8929 Other chronic pain: Secondary | ICD-10-CM | POA: Diagnosis not present

## 2021-11-10 DIAGNOSIS — Z79899 Other long term (current) drug therapy: Secondary | ICD-10-CM | POA: Diagnosis not present

## 2021-11-10 DIAGNOSIS — E785 Hyperlipidemia, unspecified: Secondary | ICD-10-CM

## 2021-11-10 DIAGNOSIS — E039 Hypothyroidism, unspecified: Secondary | ICD-10-CM

## 2021-11-10 DIAGNOSIS — M549 Dorsalgia, unspecified: Secondary | ICD-10-CM | POA: Diagnosis not present

## 2021-11-10 LAB — CBC WITH DIFFERENTIAL/PLATELET
Basophils Absolute: 0 10*3/uL (ref 0.0–0.1)
Basophils Relative: 0.5 % (ref 0.0–3.0)
Eosinophils Absolute: 0.1 10*3/uL (ref 0.0–0.7)
Eosinophils Relative: 2 % (ref 0.0–5.0)
HCT: 40.7 % (ref 36.0–46.0)
Hemoglobin: 13.7 g/dL (ref 12.0–15.0)
Lymphocytes Relative: 26.8 % (ref 12.0–46.0)
Lymphs Abs: 1.8 10*3/uL (ref 0.7–4.0)
MCHC: 33.6 g/dL (ref 30.0–36.0)
MCV: 91 fl (ref 78.0–100.0)
Monocytes Absolute: 0.6 10*3/uL (ref 0.1–1.0)
Monocytes Relative: 9.4 % (ref 3.0–12.0)
Neutro Abs: 4.2 10*3/uL (ref 1.4–7.7)
Neutrophils Relative %: 61.3 % (ref 43.0–77.0)
Platelets: 281 10*3/uL (ref 150.0–400.0)
RBC: 4.47 Mil/uL (ref 3.87–5.11)
RDW: 13.2 % (ref 11.5–15.5)
WBC: 6.8 10*3/uL (ref 4.0–10.5)

## 2021-11-10 LAB — BASIC METABOLIC PANEL
BUN: 14 mg/dL (ref 6–23)
CO2: 32 mEq/L (ref 19–32)
Calcium: 9.2 mg/dL (ref 8.4–10.5)
Chloride: 101 mEq/L (ref 96–112)
Creatinine, Ser: 0.82 mg/dL (ref 0.40–1.20)
GFR: 69.83 mL/min (ref >60.00)
Glucose, Bld: 89 mg/dL (ref 70–99)
Potassium: 3.5 mEq/L (ref 3.5–5.1)
Sodium: 141 mEq/L (ref 135–145)

## 2021-11-10 LAB — TSH: TSH: 1.97 u[IU]/mL (ref 0.35–5.50)

## 2021-11-10 NOTE — Progress Notes (Signed)
Subjective:    Patient ID: [name], female    DOB: [birth year], 76 y.o.   MRN: [id number]  DOS:  11/10/2021 Type of visit - description: CPX  Here for CPX In addition we addressed her chronic medical problems. Chart was reviewed including notes from GI, PT, EGD report etc.   Review of Systems recovering from a URI, no fever or chills. Still has occasional RUQ abdominal pain mostly with constipation and straining. Occasional urinary incontinence, nothing new.  Other than above, a 14 point review of systems is negative    Past Medical History:  Diagnosis Date   Arthritis    Cataract    both eyes rt. eye was removed   Chest pain    stress test @ [place] [facility] (-)   Colonic polyp    bx adenomatous polyps, next scope 2010   COPD (chronic obstructive pulmonary disease) ([place])    Cornea disorder    NODULES REMOVED FROM CORNEA   Fibromyalgia    see rheumatology   GERD (gastroesophageal reflux disease)    PAST HX    Hyperlipidemia    Hypothyroidism    Migraine    Ocular rosacea 03/2009   on doxy   Osteoporosis    osteopenia   Pleuritic chest pain    etiology unclear, has beeb eval by pulmonary and rheumatology (autoimmune dz?)   Wrist fracture 2018    Past Surgical History:  Procedure Laterality Date   ANTERIOR LAT LUMBAR FUSION N/A 12/03/2019   Procedure: ANTERIOR LATERAL LUMBAR FUSION L2-4;  Surgeon: Melina Schools, MD;  Location: [facility];  Service: Orthopedics;  Laterality: N/A;  4 hrs   COLONOSCOPY     COSMETIC SURGERY  04/12/2018   L upper eyelid ptosis repair, and trichophytic brow lift   ESOPHAGOGASTRODUODENOSCOPY (EGD) WITH PROPOFOL N/A 03/27/2013   Procedure: ESOPHAGOGASTRODUODENOSCOPY (EGD) WITH PROPOFOL;  Surgeon: Milus Banister, MD;  [facility];  Service: Endoscopy;  Laterality: N/A;  possible dil   EYE SURGERY     growth on cornea-bil.   plastic surgery face  2013   POLYPECTOMY     right rotator cuff repair  2010   TONSILLECTOMY      UPPER GASTROINTESTINAL ENDOSCOPY     Social History   Socioeconomic History   Marital status: Married    Spouse name: Not on file   Number of children: 0   Years of education: Not on file   Highest education level: Not on file  Occupational History   Occupation: retired, 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  Tobacco Use   Smoking status: Former    Packs/day: 1.00    Years: 38.50    Total pack years: 38.50    Types: Cigarettes    Quit date: 01/13/2004    Years since quitting: 17.8   Smokeless tobacco: Never   Tobacco comments:    quit 2005  Vaping Use   Vaping Use: Never used  Substance and Sexual Activity   Alcohol use: No    Alcohol/week: 0.0 standard drinks of alcohol    Comment: rare   Drug use: No   Sexual activity: Not Currently    Comment: first intercourse >16  Other Topics Concern   Not on file  Social History Narrative   No biological children      Social Determinants of Health   Financial Resource Strain: Low Risk  (08/18/2021)   Overall Financial Resource Strain (CARDIA)    Difficulty of Paying Living Expenses: Not  hard at all  Food Insecurity: No Food Insecurity (08/18/2021)   Hunger Vital Sign    Worried About Running Out of Food in the Last Year: Never true    Ran Out of Food in the Last Year: Never true  Transportation Needs: No Transportation Needs (08/18/2021)   PRAPARE - Hydrologist (Medical): No    Lack of Transportation (Non-Medical): No  Physical Activity: Sufficiently Active (08/18/2021)   Exercise Vital Sign    Days of Exercise per Week: 7 days    Minutes of Exercise per Session: 30 min  Stress: No Stress Concern Present (08/18/2021)   [place]    Feeling of Stress : Not at all  Social Connections: Moderately Integrated (08/18/2021)   Social Connection and Isolation Panel [NHANES]    Frequency of Communication with Friends and Family: More  than three times a week    Frequency of Social Gatherings with Friends and Family: More than three times a week    Attends Religious Services: Never    Marine scientist or Organizations: Yes    Attends Music therapist: More than 4 times per year    Marital Status: Married  Human resources officer Violence: Not At Risk (08/18/2021)   Humiliation, Afraid, Rape, and Kick questionnaire    Fear of Current or Ex-Partner: No    Emotionally Abused: No    Physically Abused: No    Sexually Abused: No     Current Outpatient Medications  Medication Instructions   ALPRAZolam (XANAX) 0.5 MG tablet TAKE 1 TABLET BY MOUTH AT BEDTIME AS NEEDED FOR ANXIETY   aspirin EC 81 mg, Oral, Daily, Swallow whole.   calcium carbonate (OS-CAL) 1250 (500 CA) MG chewable tablet 1 tablet, Oral, 2 times daily   cycloSPORINE (RESTASIS) 0.05 % ophthalmic emulsion 1 drop, 2 times daily   denosumab (PROLIA) 60 mg, Subcutaneous, Every 6 months   Estradiol (YUVAFEM) 10 MCG TABS vaginal tablet PLACE 1 TABLET VAGINALLY 2 TIMES A WEEK.   fluticasone-salmeterol (ADVAIR) 100-50 MCG/ACT AEPB 1 puff, Inhalation, At bedtime PRN   hydrochlorothiazide (MICROZIDE) 12.5 MG capsule Oral, Daily   HYDROcodone-acetaminophen (NORCO) 7.5-325 MG tablet 1 tablet, Oral, Every 6 hours PRN   hydroxypropyl methylcellulose / hypromellose (ISOPTO TEARS / GONIOVISC) 2.5 % ophthalmic solution 1 drop, Both Eyes, 3 times daily PRN   levothyroxine (SYNTHROID) 88 MCG tablet TAKE 1 TABLET BY MOUTH EVERY DAY BEFORE BREAKFAST   Multiple Vitamin (MULTIVITAMIN) capsule 1 capsule, Oral, Daily   naloxone (NARCAN) 4 MG/0.1ML LIQD nasal spray kit Use as directed   omeprazole (PRILOSEC) 40 MG capsule One pill 20-23 min before breakfast meal daily.   pravastatin (PRAVACHOL) 20 MG tablet TAKE 1 TABLET BY MOUTH EVERYDAY AT BEDTIME   Vitamin D3 2,000 Units, Oral, Daily   vitamin E 1000 UNIT capsule vitamin E       Objective:   Physical Exam BP 122/80    Pulse 67   Temp 98.3 F (36.8 C) (Oral)   Resp 18   Ht _0  (1.626 m)   Wt 186 lb (84.4 kg)   LMP 04/10/1998 (Approximate)   SpO2 97%   BMI 31.93 kg/m  General: Well developed, NAD, BMI noted Neck: No  thyromegaly  HEENT:  Normocephalic . Face symmetric, atraumatic Lungs:  CTA B Normal respiratory effort, no intercostal retractions, no accessory muscle use. Heart: RRR,  no murmur.  Abdomen:  Not distended, soft, non-tender.  No rebound or rigidity.   Lower extremities: no pretibial edema bilaterally  Skin: Exposed areas without rash. Not pale. Not jaundice Neurologic:  alert & oriented X3.  Speech normal, gait appropriate for age and unassisted Strength symmetric and appropriate for age.  Psych: Cognition and judgment appear intact.  Cooperative with normal attention span and concentration.  Behavior appropriate. No anxious or depressed appearing.     Assessment      Assessment Hypothyroidism Hyperlipidemia: Started meds 09/2019 Lower extremity edema: On HCTZ GERD Migraines insmonia-- xanax prn COPD: per chest CTs, former heavy smoker, quit 2005 [facility]- Pain mngmt  -- back pain, 2017 had a MRI, saw Dr Durward Fortes, Rx local injection, PT --h/o fibromyalgia --H/o Pleuritic chest pain, on-off, resolved  --spinal stenosis (MRI 05/2018)  Osteopenia:  Per ENDO --T score 2011   -2.6 (intolerant to fosamax, ok w/ Boniva x years) --T score 01-2013   -2.6 --T score 02-2015  -2.3, rx ca and cit D  Glaucoma Ocular Rosacea 2010 CV: LAD  artery disease  per CT scan of the chest, CP:  admitted, stress lest low risk 05-2015.  Control CV RF CAD, aortic sclerosis: Per CT  PLAN: Here for CPX Hypothyroidism: On Synthroid, check TSH, further advised with results. Hyperlipidemia: Last LDL very good, continue Pravachol Edema: On diuretics as needed, check BMP. Osteoporosis: Saw Endo 10/21/2021, note reviewed, at that time vitamin D was normal, was Rx to continue Prolia. GERD: At  the last visit he was having dysphagia, EGD 10/07/2021, exam normal, BX sent, normal. Insomnia: Not completely well controlled on Xanax, still broken sleep, she thinks probably related to pain.  Recommend add over-the-counter melatonin. Pain management, currently take Norco  1 tablet every 8 hours and often times half tablet at midnight when she is awake but the pain.  Encouraged to try to manage pain with only 3 tablets daily.   She thinks the pool PT is helping.  Will see rehab medicine soon. RUQ abdominal pain: As described above, abdominal ultrasound was negative, we agreed on observation for now. RTC 4 to 5 months

## 2021-11-10 NOTE — Patient Instructions (Addendum)
Vaccines I recommend: COVID booster Flu shot this fall  Decrease hydrocodone to every 8 hours.  Add melatonin to Xanax.  HEALTHY SLEEP Sleep hygiene: Basic rules for a good night's sleep  Sleep only as much as you need to feel rested and then get out of bed  Keep a regular sleep schedule  Avoid forcing sleep  Exercise regularly for at least 20 minutes, preferably 4 to 5 hours before bedtime  Avoid caffeinated beverages after lunch  Avoid alcohol near bedtime: no "night cap"  Avoid smoking, especially in the evening  Do not go to bed hungry  Adjust bedroom environment  Avoid prolonged use of light-emitting screens before bedtime   Deal with your worries before bedtime      GO TO THE LAB : Get the blood work     GO TO THE FRONT DESK, [place] back for a checkup in 4 to 5 months

## 2021-11-11 ENCOUNTER — Encounter: Payer: Self-pay | Admitting: Internal Medicine

## 2021-11-11 NOTE — Assessment & Plan Note (Signed)
--  Td 08-2015 - Pneumonia shot 2006 and 2013;  prevnar 2015; PNM 20 today - s/p zostavax, s/p  shingrex -COVID VAX: booster rec  -Had a flu shot   --Cscope 11-2005:  polyps-adenomatous, repeated  colonoscopy 11/2008 , 04-2012, 04/2017, + polyps, cscope 08/2020, next per  GI -- Female care per gyn, MMG 03-2021([facility]), Pap  07-2021 ([facility]) --Palpable aorta-- US neg for AAA 07-2012 --diet: Discussed --Labs: BMP, CBC, TSH, -Lung cancer screening,  did not  qualify  -- advance directives: In the chart

## 2021-11-11 NOTE — Assessment & Plan Note (Signed)
Here for CPX Hypothyroidism: On Synthroid, check TSH, further advised with results. Hyperlipidemia: Last LDL very good, continue Pravachol Edema: On diuretics as needed, check BMP. Osteoporosis: Saw Endo 10/21/2021, note reviewed, at that time vitamin D was normal, was Rx to continue Prolia. GERD: At the last visit he was having dysphagia, EGD 10/07/2021, exam normal, BX sent, normal. Insomnia: Not completely well controlled on Xanax, still broken sleep, she thinks probably related to pain.  Recommend add over-the-counter melatonin. Pain management, currently take Norco  1 tablet every 8 hours and often times half tablet at midnight when she is awake but the pain.  Encouraged to try to manage pain with only 3 tablets daily.   She thinks the pool PT is helping.  Will see rehab medicine soon. RUQ abdominal pain: As described above, abdominal ultrasound was negative, we agreed on observation for now. RTC 4 to 5 months

## 2021-11-15 LAB — DRUG MONITORING PANEL 375977 , URINE
Alcohol Metabolites: NEGATIVE ng/mL (ref <500)
Alphahydroxyalprazolam: 167 ng/mL — ABNORMAL HIGH (ref <25)
Alphahydroxymidazolam: NEGATIVE ng/mL (ref <50)
Alphahydroxytriazolam: NEGATIVE ng/mL (ref <50)
Aminoclonazepam: NEGATIVE ng/mL (ref <25)
Amphetamines: NEGATIVE ng/mL (ref <500)
Barbiturates: NEGATIVE ng/mL (ref <300)
Benzodiazepines: POSITIVE ng/mL — AB (ref <100)
Cocaine Metabolite: NEGATIVE ng/mL (ref <150)
Codeine: NEGATIVE ng/mL (ref <50)
Desmethyltramadol: NEGATIVE ng/mL (ref <100)
Hydrocodone: 1097 ng/mL — ABNORMAL HIGH (ref <50)
Hydromorphone: 627 ng/mL — ABNORMAL HIGH (ref <50)
Hydroxyethylflurazepam: NEGATIVE ng/mL (ref <50)
Lorazepam: NEGATIVE ng/mL (ref <50)
Marijuana Metabolite: NEGATIVE ng/mL (ref <20)
Morphine: NEGATIVE ng/mL (ref <50)
Nordiazepam: NEGATIVE ng/mL (ref <50)
Norhydrocodone: 1875 ng/mL — ABNORMAL HIGH (ref <50)
Opiates: POSITIVE ng/mL — AB (ref <100)
Oxazepam: NEGATIVE ng/mL (ref <50)
Oxycodone: NEGATIVE ng/mL (ref <100)
Temazepam: NEGATIVE ng/mL (ref <50)
Tramadol: NEGATIVE ng/mL (ref <100)

## 2021-11-15 LAB — DM TEMPLATE

## 2021-11-16 ENCOUNTER — Ambulatory Visit (INDEPENDENT_AMBULATORY_CARE_PROVIDER_SITE_OTHER): Payer: Medicare HMO | Admitting: Bariatrics

## 2021-11-16 ENCOUNTER — Encounter (INDEPENDENT_AMBULATORY_CARE_PROVIDER_SITE_OTHER): Payer: Self-pay

## 2021-11-22 ENCOUNTER — Encounter (HOSPITAL_BASED_OUTPATIENT_CLINIC_OR_DEPARTMENT_OTHER): Payer: Self-pay | Admitting: Physical Therapy

## 2021-11-22 ENCOUNTER — Ambulatory Visit (HOSPITAL_BASED_OUTPATIENT_CLINIC_OR_DEPARTMENT_OTHER): Payer: Medicare HMO | Attending: Physical Medicine & Rehabilitation | Admitting: Physical Therapy

## 2021-11-22 DIAGNOSIS — R262 Difficulty in walking, not elsewhere classified: Secondary | ICD-10-CM

## 2021-11-22 DIAGNOSIS — M6281 Muscle weakness (generalized): Secondary | ICD-10-CM

## 2021-11-22 DIAGNOSIS — M25551 Pain in right hip: Secondary | ICD-10-CM | POA: Diagnosis not present

## 2021-11-22 DIAGNOSIS — M5459 Other low back pain: Secondary | ICD-10-CM | POA: Diagnosis not present

## 2021-11-22 NOTE — Therapy (Signed)
OUTPATIENT PHYSICAL THERAPY PROGRESS NOTE         Recert  Patient Name: MAKYA PHILLIS MRN: [id number] DOB:[birth year], 76 y.o., female Today's Date: 11/22/2021   PT End of Session - 11/22/21 0949     Visit Number 11    Number of Visits 21    Date for PT Re-Evaluation 01/17/22    Authorization Type [employer] MCR    Progress Note Due on Visit 51    PT Start Time 0946    PT Stop Time 1030    PT Time Calculation (min) 44 min    Activity Tolerance Patient tolerated treatment well    Behavior During Therapy [facility] for tasks assessed/performed                   Past Medical History:  Diagnosis Date   Arthritis    Cataract    both eyes rt. eye was removed   Chest pain    stress test @ [place] [facility] (-)   Colonic polyp    bx adenomatous polyps, next scope 2010   COPD (chronic obstructive pulmonary disease) ([place])    Cornea disorder    NODULES REMOVED FROM CORNEA   Fibromyalgia    see rheumatology   GERD (gastroesophageal reflux disease)    PAST HX    Hyperlipidemia    Hypothyroidism    Migraine    Ocular rosacea 03/2009   on doxy   Osteoporosis    osteopenia   Pleuritic chest pain    etiology unclear, has beeb eval by pulmonary and rheumatology (autoimmune dz?)   Wrist fracture 2018   Past Surgical History:  Procedure Laterality Date   ANTERIOR LAT LUMBAR FUSION N/A 12/03/2019   Procedure: ANTERIOR LATERAL LUMBAR FUSION L2-4;  Surgeon: Melina Schools, MD;  Location: [facility];  Service: Orthopedics;  Laterality: N/A;  4 hrs   COLONOSCOPY     COSMETIC SURGERY  04/12/2018   L upper eyelid ptosis repair, and trichophytic brow lift   ESOPHAGOGASTRODUODENOSCOPY (EGD) WITH PROPOFOL N/A 03/27/2013   Procedure: ESOPHAGOGASTRODUODENOSCOPY (EGD) WITH PROPOFOL;  Surgeon: Milus Banister, MD;  [facility];  Service: Endoscopy;  Laterality: N/A;  possible dil   EYE SURGERY     growth on cornea-bil.   plastic surgery face  2013   POLYPECTOMY     right rotator  cuff repair  2010   TONSILLECTOMY     UPPER GASTROINTESTINAL ENDOSCOPY     Patient Active Problem List   Diagnosis Date Noted   Fusion of lumbar spine 12/03/2019   COPD (chronic obstructive pulmonary disease) ([place]) 08/22/2019   Spinal stenosis of lumbar region with neurogenic claudication 11/22/2018   Coronary artery disease due to lipid rich plaque 10/03/2018   Closed fracture of distal end of radius 04/16/2017   Chronic back pain 03/15/2017   PCP NOTES >>>>>>>>>>>>>>>>>>>> 05/24/2015   Numbness and tingling of right arm 05/22/2015   Insomnia 09/01/2014   GERD (gastroesophageal reflux disease) 03/27/2013   Dysphagia, pharyngoesophageal phase 03/27/2013   Annual physical exam 06/12/2011   Sinusitis, chronic 02/02/2011   Osteoporosis 06/08/2010   Fibromyalgia, pain mngmt 01/29/2009   LIPOMA 09/30/2008   Migraine without aura 03/04/2007   Hypothyroidism 05/15/2006   COLONIC POLYPS, HX OF 05/15/2006    PCP:  Kathlene November, MD  REFERRING PROVIDER:  Alysia Penna, MD  REFERRING DIAG:  [phone] (ICD-10-CM) - Spondylosis without myelopathy or radiculopathy, lumbar region  M96.1 (ICD-10-CM) - Lumbar post-laminectomy syndrome    THERAPY DIAG:  Other low back pain  Muscle weakness (generalized)  Difficulty walking  Pain in right hip  ONSET DATE: March 2023  SUBJECTIVE:                                                                                                                                                                                           SUBJECTIVE STATEMENT:   "Was sick, flat on my back for 10 days, dog is declining and I have been up and night with her, I am tired. I hate to admit but I can tell that I have digressed since stopping exercising past month, I don't have the stamina I had.  Did get membership to [facility] but have not yet been there."    PERTINENT HISTORY:  wrist fx, pleuritic chest pain, osteoporosis, migraine, hypothyroidism, glaucoma,  fibromyalgia, R RTC repair, anterior lateral lumbar fusion L2-12 Nov 2019   PAIN:  Are you having pain? Yes: NPRS scale: 5/10 Pain location: R  hip, L/S, R SIJ Pain description: tightness, strained muscle, pulled muscle Aggravating factors: bending, lifting, stairs, sleeping, sitting, standing too long, tighter pants Relieving factors: ice, stretching, pain pill   PRECAUTIONS: None  WEIGHT BEARING RESTRICTIONS No  FALLS:  Has patient fallen in last 6 months? No  LIVING ENVIRONMENT: Lives with: lives with their family and lives with their spouse Lives in: House/apartment Stairs: Yes 2 story home Has following equipment at home: None  OCCUPATION: retired  PLOF: Independent with basic ADLs  PATIENT GOALS : Pt states she would like to be able to reduce constant pain   OBJECTIVE:   DIAGNOSTIC FINDINGS:  IMPRESSION: 1. Interval L2-3 and L3-4 PLIF with probable solid arthrodesis. No recurrent impingement. There has been progression of degenerative disease at the remaining open lumbar levels. Mild discogenic edema at L1-2 and L5-S1. 2. L1-2 high-grade spinal stenosis and moderate left foraminal narrowing. 3. L4-5 high-grade spinal stenosis. 4. L5-S1 right more than left subarticular recess narrowing. Moderate bilateral foraminal narrowing.  PATIENT SURVEYS:  FOTO 45 49 at DC  44 FOTO 7/14 40 FOTO 8/15  LUMBAR ROM:   Active  A/PROM  08/19/2021 AROM 7/14  Flexion 75% 75%  Extension 25% p! 50% p!  Right lateral flexion 50%p! 75% p!  Left lateral flexion 50% 50% p!  Right rotation 50% 75%  Left rotation 50% 75%   (Blank rows = not tested)  LE MMT:  myotomal weakness of R LE noted at hip, knee, and ankle eversion, 4/5 quickly fatiguing  MMT Right 08/19/2021 Left 08/19/2021 R  7/14 L 7/14 Right / Left 8/15  Hip flexion myotomal weakness 4+/5 4/5 fatiguing 4+/5  3+/5 - 4/5  Hip abduction myotomal weakness 4+/5 4/5 fatiguing 4+/5 4/5 - 4/5  Hip adduction myotomal  weakness 4+/5 4/5 fatiguing 4+/5 4+/5 - 4+/5  Knee flexion myotomal weakness 4+/5 4/5 fatiguing 4+/5 4/5 - 4/5  Knee extension myotomal weakness 4+/5 4/5 fatiguing 4+/5 4+/5 - 4+/5  Ankle dorsiflexion 4+/5 4+/5 4/5 fatiguing 4+/5 4+/5 - 4+/5  Ankle eversion myotomal weakness 4+/5 4/5 fatiguing 4+/5 4+/5    (Blank rows = not tested)  FUNCTIONAL TESTS:  5 times sit to stand: 19.1s  eval  7/14 5XSTS 13.1s 8/15 5xSTS 14.6    TODAY'S TREATMENT   8/15 Objective testing  Pt seen for aquatic therapy today.  Treatment took place in water 3.25-4 ft in depth at the [facility] pool. Temp of water was 92.  Pt entered/exited the pool via stairs (step to) independently with bilat rail.   Warm up: 4x sidestepping Exercises:  Standing lumbar flexion and rotation with noodle 10x each Standing board press and row with ab brace 2x10 ea Standing hip hinge x10 Marching 3x pool width with kickboard support  Standing hip flexion ; add/abd; df; pf; hip openers; hurdles x 10 Straddling yellow noodle: cylcing and add/abd  Walking laps between exercise for active recovery      Pt requires buoyancy for support and to offload joints with strengthening exercises. Viscosity of the water is needed for resistance of strengthening; water current perturbations provides challenge to standing balance unsupported, requiring increased core activation. 10/25/21 Pt seen for aquatic therapy today.  Treatment took place in water 3.25-4 ft in depth at the [facility] pool. Temp of water was 92.  Pt entered/exited the pool via stairs (step to) independently with bilat rail.   Warm up: 4 widths ea sidestepping, fwd, and retro   Exercises:  LB, hamstring and gastroc stretch 2nd step holding to handrails Squats x 10 Hip openers x 10 (limited tolerance with RLE) (hip abdct/knee flexion) Standing hip ABD 2x10 each Hip ext x 10 each Stork stance with UE on wall and on noddle with hip IR/ER Thin  sqoodle press down towards thighs x 20  Kick board push / pull  at 10, 12, 2 o'clock in staggered stance (kick board 1/2 submerged)  Straddling noodle 3 trials: cycling x 2 widths, scissoring 2x20, skii 2x20    Pt requires buoyancy for support and to offload joints with strengthening exercises. Viscosity of the water is needed for resistance of strengthening; water current perturbations provides challenge to standing balance unsupported, requiring increased core activation. 7/14  Full review of HEP, exam findings  Exercises - Seated Flexion Stretch with [place]  - 2 x daily - 7 x weekly - 1 sets - 10 reps - 10 hold - Seated Quadratus Lumborum Stretch in Chair  - 2 x daily - 7 x weekly - 1 sets - 3 reps - 30 hold - Supine Posterior Pelvic Tilt  - 1 x daily - 7 x weekly - 2 sets - 10 reps - 2 hold - Supine Bridge  - 1 x daily - 7 x weekly - 2 sets - 10 reps - Supine Transversus Abdominis Bracing with Leg Extension  - 1 x daily - 7 x weekly - 2 sets - 10 reps - Side Stepping  - 1 x daily - 7 x weekly - 1 sets - 3 reps       PATIENT EDUCATION:  Education details: exam findings, exercise progression, muscle firing,  envelope of function, HEP, POC  Person educated: Patient Education method: Explanation, Demonstration, Tactile cues, Verbal cues, and Handouts Education comprehension: verbalized understanding, returned demonstration, verbal cues required, and tactile cues required   HOME EXERCISE PROGRAM: Access Code: [email or web address] URL: [email or web address] Date: 08/19/2021 Prepared by: Daleen Bo   ASSESSMENT:  CLINICAL IMPRESSION: Recert: pt has had a slight decline due to extended illness. She continues to show progress since initial evaluation in most areas. Strength, 5 x STS and FOTO testing as noted above.  She continues to be limited by R sided hip pain and strength. We will continue with skilled physical therapy to return pt to gains initially made then start the  taper sessions establishing pool HEP and transition her to indep exercise at [facility]. Pt has cataract surgery scheduled for end of month and will miss~ 1 week of therapy.  Will write orders to accommodate.    Pt would benefit from continued skilled therapy in order to reach goals and maximize functional R hip strength and ROM for prevention of further functional decline.    OBJECTIVE IMPAIRMENTS Abnormal gait, decreased activity tolerance, decreased balance, decreased endurance, decreased knowledge of condition, decreased mobility, difficulty walking, decreased ROM, decreased strength, hypomobility, increased muscle spasms, impaired flexibility, improper body mechanics, postural dysfunction, and pain.   ACTIVITY LIMITATIONS cleaning, community activity, driving, occupation, laundry, yard work, shopping, and exercise .   PERSONAL FACTORS Age, Behavior pattern, Fitness, Past/current experiences, Time since onset of injury/illness/exacerbation, and 1-2 comorbidities:    are also affecting patient's functional outcome.    REHAB POTENTIAL: Fair    CLINICAL DECISION MAKING: Evolving/moderate complexity  EVALUATION COMPLEXITY: Moderate   GOALS:   SHORT TERM GOALS: Target date: 09/30/2021    Pt will become independent with HEP in order to demonstrate synthesis of PT education.  Baseline: Goal status: MET  2.  Pt will be able to demonstrate STS with full hip extension in order to demonstrate functional improvement in R hip strength and ROM for self-care and house hold duties.  Baseline:  Goal status: MET  3.  Pt will score at least 5 pt increase on FOTO to demonstrate functional improvement in MCII and pt perceived function.   Baseline:  Goal status: ongoing   LONG TERM GOALS: Target date: 01/17/2022   Pt will become independent with final HEP in order to demonstrate synthesis of PT education.  Baseline:  Goal status: ongoing  2.  Pt will be able to perform 5XSTS in under 12 in order  to demonstrate functional improvement above the cut off score for older adults.  Baseline:  Goal status: ongoing  3.  Pt will be able to demonstrate ability to perform step to pattern on stairs without UE and pain in order to demonstrate functional improvement in L/S and LE function for self-care and house hold duties.  Baseline:  Goal status: ongoing  4.  Pt will score >/= 49 on FOTO to demonstrate improvement in perceived L/S function.  Baseline:  Goal status: ongoing    PLAN: PT FREQUENCY: 1-2x/week  PT DURATION: 8 weeks  PLANNED INTERVENTIONS: Therapeutic exercises, Therapeutic activity, Neuromuscular re-education, Balance training, Gait training, Patient/Family education, Joint manipulation, Joint mobilization, Stair training, Orthotic/Fit training, Aquatic Therapy, Dry Needling, Electrical stimulation, Spinal manipulation, Spinal mobilization, Cryotherapy, Moist heat, Compression bandaging, scar mobilization, Taping, Vasopneumatic device, Traction, Ultrasound, Ionotophoresis 19m/ml Dexamethasone, and Manual therapy.  PLAN FOR NEXT SESSION:  progress aquatic HEP weekly [facility] and R hip mobility, strength, general conditioning   Brayon Bielefeld (FTharon Aquas ZCampbell HillMPT  11/22/21 3:27 PM

## 2021-11-23 ENCOUNTER — Ambulatory Visit (INDEPENDENT_AMBULATORY_CARE_PROVIDER_SITE_OTHER): Payer: Medicare HMO | Admitting: Bariatrics

## 2021-11-23 ENCOUNTER — Encounter (INDEPENDENT_AMBULATORY_CARE_PROVIDER_SITE_OTHER): Payer: Self-pay | Admitting: Bariatrics

## 2021-11-23 VITALS — BP 114/77 | HR 61 | Temp 98.1°F | Ht 65.0 in | Wt 184.0 lb

## 2021-11-23 DIAGNOSIS — Z6830 Body mass index (BMI) 30.0-30.9, adult: Secondary | ICD-10-CM | POA: Diagnosis not present

## 2021-11-23 DIAGNOSIS — R5383 Other fatigue: Secondary | ICD-10-CM | POA: Diagnosis not present

## 2021-11-23 DIAGNOSIS — E038 Other specified hypothyroidism: Secondary | ICD-10-CM

## 2021-11-23 DIAGNOSIS — E669 Obesity, unspecified: Secondary | ICD-10-CM

## 2021-11-23 DIAGNOSIS — R739 Hyperglycemia, unspecified: Secondary | ICD-10-CM | POA: Diagnosis not present

## 2021-11-23 DIAGNOSIS — Z1331 Encounter for screening for depression: Secondary | ICD-10-CM | POA: Diagnosis not present

## 2021-11-23 DIAGNOSIS — R0602 Shortness of breath: Secondary | ICD-10-CM | POA: Diagnosis not present

## 2021-11-23 DIAGNOSIS — E559 Vitamin D deficiency, unspecified: Secondary | ICD-10-CM | POA: Diagnosis not present

## 2021-11-23 DIAGNOSIS — E538 Deficiency of other specified B group vitamins: Secondary | ICD-10-CM | POA: Diagnosis not present

## 2021-11-23 DIAGNOSIS — E785 Hyperlipidemia, unspecified: Secondary | ICD-10-CM | POA: Diagnosis not present

## 2021-11-23 NOTE — Progress Notes (Signed)
Cardiology Office Note:    Date:  11/30/2021   ID:  [name], DOB 07/06/1945, MRN [id number]  PCP:  Colon Branch, MD  Cardiologist:  Jenkins Rouge, MD   Electrophysiologist:  None   Referring MD: Colon Branch, MD   Chief Complaint:  No chief complaint on file.    Patient Profile:    [name] is a 76 y.o. female with:  Hypothyroidism Hyperlipidemia  Migraine HAs Fibromyalgia  Chest pain, Palpitations   Evaluation in 1/17 >> Rare PVCs on tele; Myoview neg for ischemia  Ex-smoker Coronary artery Ca2+ on CT 09/2018 Myoview 12/2018: low risk  Aortic atherosclerosis COPD  Prior CV studies: Myoview 12/23/2018 EF 74, no ischemia; low risk   History of Present Illness:    76 y.o. history of hypothyroidism, fibromyalgia atypical chest pain with normal myovue in 2017.and again in September of 2020 Benign palpitations with rare PvCls on monitor She is a previous heavy smoker with COPD She has had multiple back surgeries Seen by primary and noted some LE edema with no other congestive symptoms BMET and UA where normal Started on HCTZ with improvement 10/30/21   She is from [place] lived in [place] and worked for [employer] Dependant edema improved with HCTZ she take latter in afternoon     Past Medical History:  Diagnosis Date   Arthritis    Back pain    Back pain    Cataract    both eyes rt. 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   Fibromyalgia    see rheumatology   GERD (gastroesophageal reflux disease)    PAST HX    High blood pressure    High cholesterol    Hyperlipidemia    Hypothyroidism    Joint pain    Migraine    Ocular rosacea 03/2009   on doxy   Osteoporosis    osteopenia   Pleuritic chest pain    etiology unclear, has beeb eval by  pulmonary and rheumatology (autoimmune dz?)   Swelling of lower extremity    Wrist fracture 2018    Current Medications: Current Meds  Medication Sig   ALPRAZolam (XANAX) 0.5 MG tablet TAKE 1 TABLET BY MOUTH AT BEDTIME AS NEEDED FOR ANXIETY   aspirin 81 MG EC tablet Take 81 mg by mouth daily. Swallow whole.   calcium carbonate (OS-CAL) 1250 (500 CA) MG chewable tablet Chew 1 tablet by mouth 2 (two) times daily.    Cholecalciferol (VITAMIN D3) 50 MCG (2000 UT) CHEW Chew 2,000 Units by mouth daily.    cycloSPORINE (RESTASIS) 0.05 % ophthalmic emulsion 1 drop 2 (two) times daily.   denosumab (PROLIA) 60 MG/ML SOSY injection Inject 60 mg into the skin every 6 (six) months.   Estradiol (YUVAFEM) 10 MCG TABS vaginal tablet PLACE 1 TABLET VAGINALLY 2 TIMES A WEEK.   fluticasone-salmeterol (ADVAIR) 100-50 MCG/ACT AEPB Inhale 1 puff into the lungs at bedtime as needed.   hydrochlorothiazide (MICROZIDE) 12.5 MG capsule TAKE 1 CAPSULE BY MOUTH EVERY DAY   HYDROcodone-acetaminophen (NORCO) 7.5-325 MG tablet Take 1 tablet by mouth every 6 (six) hours as needed for moderate pain.   hydroxypropyl methylcellulose / hypromellose (ISOPTO TEARS / GONIOVISC) 2.5 % ophthalmic  solution Place 1 drop into both eyes 3 (three) times daily as needed for dry eyes.   levothyroxine (SYNTHROID) 88 MCG tablet TAKE 1 TABLET BY MOUTH EVERY DAY BEFORE BREAKFAST   Multiple Vitamin (MULTIVITAMIN) capsule Take 1 capsule by mouth daily.     naloxone (NARCAN) 4 MG/0.1ML LIQD nasal spray kit Use as directed   omeprazole (PRILOSEC) 40 MG capsule One pill 20-23 min before breakfast meal daily.   pravastatin (PRAVACHOL) 20 MG tablet TAKE 1 TABLET BY MOUTH EVERYDAY AT BEDTIME   vitamin E 1000 UNIT capsule vitamin E     Allergies:   Alendronate sodium, Bactrim [sulfamethoxazole-trimethoprim], Contrast media [iodinated contrast media], Iodine, Milnacipran, and Penicillins   Social History   Tobacco Use   Smoking status: Former     Packs/day: 1.00    Years: 38.50    Total pack years: 38.50    Types: Cigarettes    Quit date: 01/13/2004    Years since quitting: 17.8   Smokeless tobacco: Never   Tobacco comments:    quit 2005  Vaping Use   Vaping Use: Never used  Substance Use Topics   Alcohol use: No    Alcohol/week: 0.0 standard drinks of alcohol    Comment: rare   Drug use: No     Family Hx: The patient's family history is negative for Cancer, Depression, Diabetes, Stroke, Hypertension, and Heart disease. She was adopted.  ROS   EKGs/Labs/Other Test Reviewed:    EKG:  EKG is  ordered today.  The ekg ordered today demonstrates normal sinus rhythm, heart rate 71, normal axis, no ST-T wave changes, QTC 454  11/30/2021 SR rate 66 normal   Recent Labs: 07/26/2021: ALT 11 11/10/2021: BUN 14; Creatinine, Ser 0.82; Hemoglobin 13.7; Platelets 281.0; Potassium 3.5; Sodium 141; TSH 1.97   Recent Lipid Panel Lab Results  Component Value Date/Time   CHOL 154 07/26/2021 11:06 AM   TRIG 143.0 07/26/2021 11:06 AM   TRIG 133 02/21/2006 09:54 AM   HDL 54.30 07/26/2021 11:06 AM   CHOLHDL 3 07/26/2021 11:06 AM   LDLCALC 71 07/26/2021 11:06 AM   LDLCALC 52 01/29/2020 10:56 AM   LDLDIRECT 92.0 03/04/2007 09:33 AM    Physical Exam:    VS:  BP 110/70 (BP Location: Left Arm, Patient Position: Sitting, Cuff Size: Normal)   Pulse 66   Ht _0  (1.651 m)   Wt 184 lb (83.5 kg)   LMP 04/10/1998 (Approximate)   SpO2 97%   BMI 30.62 kg/m     Wt Readings from Last 3 Encounters:  11/30/21 184 lb (83.5 kg)  11/23/21 184 lb (83.5 kg)  11/10/21 186 lb (84.4 kg)     Affect appropriate Healthy:  appears stated age 51: normal Neck supple with no adenopathy JVP normal no bruits no thyromegaly Lungs clear with no wheezing and good diaphragmatic motion Heart:  S1/S2 no murmur, no rub, gallop or click PMI normal Abdomen: benighn, BS positve, no tenderness, no AAA no bruit.  No HSM or HJR Distal pulses intact with no  bruits Plus one bilateral edema Neuro non-focal Skin warm and dry No muscular weakness     ASSESSMENT & PLAN:    Chest pain: atypical normal myovue x 2 most recently 2020 observe COPD quit smoking around 2006 f/u primary regarding lung cancer CT needs  Thyroid: continue synthroid replacement  HLD:  on statin labs with primary Had some LAD calcium and aortic atherosclerosis noted on lung CT 2020 Edema:  dependant from venous  dx and obesity HCTZ 12.5 mg daily improved   F/U in a year   Medication Adjustments/Labs and Tests Ordered: Current medicines are reviewed at length with the patient today.  Concerns regarding medicines are outlined above.  Tests Ordered: No orders of the defined types were placed in this encounter.   Medication Changes: No orders of the defined types were placed in this encounter.    Signed, Jenkins Rouge, MD  11/30/2021 10:27 AM    [address], [place], [place]  [address] Phone: [phone]; Fax: [phone]

## 2021-11-24 ENCOUNTER — Encounter (INDEPENDENT_AMBULATORY_CARE_PROVIDER_SITE_OTHER): Payer: Self-pay | Admitting: Bariatrics

## 2021-11-24 ENCOUNTER — Ambulatory Visit: Payer: Medicare HMO | Admitting: Cardiovascular Disease

## 2021-11-24 DIAGNOSIS — R7303 Prediabetes: Secondary | ICD-10-CM | POA: Insufficient documentation

## 2021-11-24 LAB — HEMOGLOBIN A1C
Est. average glucose Bld gHb Est-mCnc: 117 mg/dL
Hgb A1c MFr Bld: 5.7 % — ABNORMAL HIGH (ref 4.8–5.6)

## 2021-11-24 LAB — VITAMIN B12: Vitamin B-12: 841 pg/mL (ref 232–1245)

## 2021-11-24 LAB — VITAMIN D 25 HYDROXY (VIT D DEFICIENCY, FRACTURES): Vit D, 25-Hydroxy: 47.1 ng/mL (ref 30.0–100.0)

## 2021-11-24 LAB — INSULIN, RANDOM: INSULIN: 8.7 u[IU]/mL (ref 2.6–24.9)

## 2021-11-29 ENCOUNTER — Other Ambulatory Visit: Payer: Self-pay | Admitting: Internal Medicine

## 2021-11-30 ENCOUNTER — Other Ambulatory Visit (HOSPITAL_BASED_OUTPATIENT_CLINIC_OR_DEPARTMENT_OTHER): Payer: Self-pay

## 2021-11-30 ENCOUNTER — Encounter: Payer: Self-pay | Admitting: Cardiovascular Disease

## 2021-11-30 ENCOUNTER — Ambulatory Visit: Payer: Medicare HMO | Admitting: Cardiovascular Disease

## 2021-11-30 VITALS — BP 110/70 | HR 66 | Ht 65.0 in | Wt 184.0 lb

## 2021-11-30 DIAGNOSIS — R079 Chest pain, unspecified: Secondary | ICD-10-CM

## 2021-11-30 DIAGNOSIS — I493 Ventricular premature depolarization: Secondary | ICD-10-CM

## 2021-11-30 DIAGNOSIS — R609 Edema, unspecified: Secondary | ICD-10-CM | POA: Diagnosis not present

## 2021-11-30 MED ORDER — HYDROCODONE-ACETAMINOPHEN 7.5-325 MG PO TABS
1.0000 | ORAL_TABLET | Freq: Four times a day (QID) | ORAL | 0 refills | Status: DC | PRN
  Filled 2021-11-30: qty 120, 30d supply, fill #0

## 2021-11-30 NOTE — Telephone Encounter (Signed)
Requesting: hydrocodone 7.5-'325mg'$  Contract: 11/10/21 UDS: 11/10/21 Last Visit: 11/10/21 Next Visit: 04/13/22 Last Refill: 11/01/21 #120 and 0RF  Please Advise

## 2021-11-30 NOTE — Patient Instructions (Signed)

## 2021-12-01 ENCOUNTER — Encounter (INDEPENDENT_AMBULATORY_CARE_PROVIDER_SITE_OTHER): Payer: Self-pay | Admitting: Bariatrics

## 2021-12-01 NOTE — Progress Notes (Signed)
Chief Complaint:   OBESITY [name] (MR# [id number]) is a 76 y.o. female who presents for evaluation and treatment of obesity and related comorbidities. Current BMI is Body mass index is 30.62 kg/m. [name] has been struggling with her weight for many years and has been unsuccessful in either losing weight, maintaining weight loss, or reaching her healthy weight goal.  [name] does not like to cook.  She states that she is a picky eater.  [name] is currently in the action stage of change and ready to dedicate time achieving and maintaining a healthier weight. [name] is interested in becoming our patient and working on intensive lifestyle modifications including (but not limited to) diet and exercise for weight loss.  [name]'s habits were reviewed today and are as follows: Her family eats meals together, she thinks her family will eat healthier with her, her desired weight loss is 44 lbs, she started gaining weight gradually, her heaviest weight ever was 200 pounds, she is a picky eater and doesn't like to eat healthier foods, she has significant food cravings issues, she snacks frequently in the evenings, she skips meals frequently, she is frequently drinking liquids with calories, she frequently eats larger portions than normal, and she struggles with emotional eating.  Depression Screen [name]'s Food and Mood (modified PHQ-9) score was 9.     11/23/2021    9:04 AM  Depression screen PHQ 2/9  Decreased Interest 1  Down, Depressed, Hopeless 1  PHQ - 2 Score 2  Altered sleeping 3  Tired, decreased energy 2  Change in appetite 1  Feeling bad or failure about yourself  1  Trouble concentrating 0  Moving slowly or fidgety/restless 0  Suicidal thoughts 0  PHQ-9 Score 9  Difficult doing work/chores Not difficult at all   Subjective:   1. Other fatigue [name] admits to daytime somnolence and admits to waking up still tired. Patient has a history of symptoms of daytime  fatigue and morning fatigue. [name] generally gets 6 hours of sleep per night, and states that she has nightime awakenings. Snoring is present. Apneic episodes are not present. Epworth Sleepiness Score is 5.   2. SOB (shortness of breath) on exertion [name] notes increasing shortness of breath with exercising and seems to be worsening over time with weight gain. She notes getting out of breath sooner with activity than she used to. This has not gotten worse recently. [name] denies shortness of breath at rest or orthopnea.  3. Other specified hypothyroidism [name] is taking levothyroxine, and her levels are controlled.  4. Hyperlipidemia, unspecified hyperlipidemia type [name] is taking Pravachol, and she denies myalgias.  5. Hyperglycemia [name] is not on medications currently.  6. Vitamin D deficiency [name] is currently taking vitamin D and multivitamin Gummies.  7. Vitamin B 12 deficiency [name] is currently taking multivitamin Gummies.  Assessment/Plan:   1. Other fatigue [name] does feel that her weight is causing her energy to be lower than it should be. Fatigue may be related to obesity, depression or many other causes. Labs will be ordered, and in the meanwhile, [name] will focus on self care including making healthy food choices, increasing physical activity and focusing on stress reduction.  2. SOB (shortness of breath) on exertion [name] does feel that she gets out of breath more easily that she used to when she exercises. [name]'s shortness of breath appears to be obesity related and exercise induced. She has agreed to work on weight loss and gradually increase  exercise to treat her exercise induced shortness of breath. Will continue to monitor closely.  3. Other specified hypothyroidism [name] will continue levothyroxine as directed.  4. Hyperlipidemia, unspecified hyperlipidemia type We will check labs today.  [name] will continue Pravachol, she will  eliminate trans fats, limit saturated fats to dairy and unprocessed meat.  - Hemoglobin A1c - Insulin, random  5. Hyperglycemia We will check labs today.  [name] will continue to work on minimizing carbohydrates.  - Hemoglobin A1c - Insulin, random  6. Vitamin D deficiency We will check labs today, and we will follow-up at [facility] next visit.  - VITAMIN D 25 Hydroxy (Vit-D Deficiency, Fractures)  7. Vitamin B 12 deficiency We will check labs today, and we will [facility] at [facility] next visit.  - Vitamin B12  8. Depression screening [name] had a positive depression screening. Depression is commonly associated with obesity and often results in emotional eating behaviors. We will monitor this closely and work on CBT to help improve the non-hunger eating patterns. Referral to Psychology may be required if no improvement is seen as she continues in our clinic.  9. Obesity, Current BMI 30.7 [name] is currently in the action stage of change and her goal is to continue with weight loss efforts. I recommend [name] begin the structured treatment plan as follows:  She has agreed to the Category 1 Plan.  Meal planning and mindful eating were discussed.  Reviewed labs with the patient from 11/10/2021, BMP, GFR, CBC, glucose, and TSH.  Exercise goals: Doing pool therapy.  Behavioral modification strategies: increasing lean protein intake, decreasing simple carbohydrates, increasing vegetables, decreasing eating out, no skipping meals, meal planning and cooking strategies, keeping healthy foods in the home, and planning for success.  She was informed of the importance of frequent follow-up visits to maximize her success with intensive lifestyle modifications for her multiple health conditions. She was informed we would discuss her lab results at her next visit unless there is a critical issue that needs to be addressed sooner. [name] agreed to keep her next visit at the agreed upon time to  discuss these results.  Objective:   Blood pressure 114/77, pulse 61, temperature 98.1 F (36.7 C), height '5\' 5"'$  (1.651 m), weight 184 lb (83.5 kg), last menstrual period 04/10/1998, SpO2 97 %. Body mass index is 30.62 kg/m.  EKG: Normal sinus rhythm, rate (unable to obtain).  Indirect Calorimeter completed today shows a VO2 of 194 and a REE of 1339.  Her calculated basal metabolic rate is 8127 thus her basal metabolic rate is worse than expected.  General: Cooperative, alert, well developed, in no acute distress. HEENT: Conjunctivae and lids unremarkable. Cardiovascular: Regular rhythm.  Lungs: Normal work of breathing. Neurologic: No focal deficits.   Lab Results  Component Value Date   CREATININE 0.82 11/10/2021   BUN 14 11/10/2021   NA 141 11/10/2021   K 3.5 11/10/2021   CL 101 11/10/2021   CO2 32 11/10/2021   Lab Results  Component Value Date   ALT 11 07/26/2021   AST 14 07/26/2021   ALKPHOS 78 10/18/2020   BILITOT 0.6 10/18/2020   Lab Results  Component Value Date   HGBA1C 5.7 (H) 11/23/2021   HGBA1C 5.8 03/24/2021   HGBA1C 5.5 05/22/2015   Lab Results  Component Value Date   INSULIN 8.7 11/23/2021   Lab Results  Component Value Date   TSH 1.97 11/10/2021   Lab Results  Component Value Date   CHOL 154 07/26/2021  HDL 54.30 07/26/2021   LDLCALC 71 07/26/2021   LDLDIRECT 92.0 03/04/2007   TRIG 143.0 07/26/2021   CHOLHDL 3 07/26/2021   Lab Results  Component Value Date   WBC 6.8 11/10/2021   HGB 13.7 11/10/2021   HCT 40.7 11/10/2021   MCV 91.0 11/10/2021   PLT 281.0 11/10/2021   No results found for: "IRON", "TIBC", "FERRITIN"  Attestation Statements:   Reviewed by clinician on day of visit: allergies, medications, problem list, medical history, surgical history, family history, social history, and previous encounter notes.   Wilhemena Durie, am acting as Location manager for [employer], DO.  I have reviewed the above documentation for  accuracy and completeness, and I agree with the above. Jearld Lesch, DO

## 2021-12-05 ENCOUNTER — Ambulatory Visit (HOSPITAL_BASED_OUTPATIENT_CLINIC_OR_DEPARTMENT_OTHER): Payer: Medicare HMO | Admitting: Physical Therapy

## 2021-12-05 ENCOUNTER — Encounter (HOSPITAL_BASED_OUTPATIENT_CLINIC_OR_DEPARTMENT_OTHER): Payer: Self-pay | Admitting: Physical Therapy

## 2021-12-05 DIAGNOSIS — M6281 Muscle weakness (generalized): Secondary | ICD-10-CM | POA: Diagnosis not present

## 2021-12-05 DIAGNOSIS — M5459 Other low back pain: Secondary | ICD-10-CM | POA: Diagnosis not present

## 2021-12-05 DIAGNOSIS — M25551 Pain in right hip: Secondary | ICD-10-CM | POA: Diagnosis not present

## 2021-12-05 DIAGNOSIS — R262 Difficulty in walking, not elsewhere classified: Secondary | ICD-10-CM | POA: Diagnosis not present

## 2021-12-05 NOTE — Therapy (Signed)
OUTPATIENT PHYSICAL THERAPY PROGRESS NOTE           Patient Name: [name] MRN: [id number] DOB:12/27/1945, 76 y.o., female Today's Date: 12/05/2021   PT End of Session - 12/05/21 1153     Visit Number 12    Number of Visits 21    Date for PT Re-Evaluation 01/17/22    Authorization Type [employer] MCR    Progress Note Due on Visit 19    PT Start Time 1115    PT Stop Time 1158    PT Time Calculation (min) 43 min    Activity Tolerance Patient tolerated treatment well    Behavior During Therapy WFL for tasks assessed/performed              Past Medical History:  Diagnosis Date   Arthritis    Back pain    Back pain    Cataract    both eyes rt. 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   Fibromyalgia    see rheumatology   GERD (gastroesophageal reflux disease)    PAST HX    High blood pressure    High cholesterol    Hyperlipidemia    Hypothyroidism    Joint pain    Migraine    Ocular rosacea 03/2009   on doxy   Osteoporosis    osteopenia   Pleuritic chest pain    etiology unclear, has beeb eval by pulmonary and rheumatology (autoimmune dz?)   Swelling of lower extremity    Wrist fracture 2018   Past Surgical History:  Procedure Laterality Date   ANTERIOR LAT LUMBAR FUSION N/A 12/03/2019   Procedure: ANTERIOR LATERAL LUMBAR FUSION L2-4;  Surgeon: Melina Schools, MD;  Location: [place];  Service: Orthopedics;  Laterality: N/A;  4 hrs   COLONOSCOPY     COSMETIC SURGERY  04/12/2018   L upper eyelid ptosis repair, and trichophytic brow lift   ESOPHAGOGASTRODUODENOSCOPY (EGD) WITH PROPOFOL N/A 03/27/2013   Procedure: ESOPHAGOGASTRODUODENOSCOPY (EGD) WITH PROPOFOL;  Surgeon: Milus Banister, MD;  Location: [facility];  Service: Endoscopy;  Laterality: N/A;   possible dil   EYE SURGERY     growth on cornea-bil.   plastic surgery face  2013   POLYPECTOMY     right rotator cuff repair  2010   TONSILLECTOMY     UPPER GASTROINTESTINAL ENDOSCOPY     Patient Active Problem List   Diagnosis Date Noted   Prediabetes 11/24/2021   Other fatigue 11/23/2021   SOB (shortness of breath) on exertion 11/23/2021   Hyperlipidemia 11/23/2021   Hyperglycemia 11/23/2021   Vitamin D deficiency 11/23/2021   Vitamin B 12 deficiency 11/23/2021   Class 1 obesity with serious comorbidity and body mass index (BMI) of 30.0 to 30.9 in adult 11/23/2021   Fusion of lumbar spine 12/03/2019   COPD (chronic obstructive pulmonary disease) ([place]) 08/22/2019   Spinal stenosis of lumbar region with neurogenic claudication 11/22/2018   Coronary artery disease due to lipid rich plaque 10/03/2018   Closed fracture of distal end of radius 04/16/2017   Chronic back pain 03/15/2017   PCP NOTES >>>>>>>>>>>>>>>>>>>> 05/24/2015   Numbness and tingling of right arm 05/22/2015   Insomnia 09/01/2014   GERD (gastroesophageal reflux disease)  03/27/2013   Dysphagia, pharyngoesophageal phase 03/27/2013   Annual physical exam 06/12/2011   Sinusitis, chronic 02/02/2011   Osteoporosis 06/08/2010   Fibromyalgia, pain mngmt 01/29/2009   LIPOMA 09/30/2008   Migraine without aura 03/04/2007   Hypothyroidism 05/15/2006   COLONIC POLYPS, HX OF 05/15/2006    PCP:  Kathlene November, MD  REFERRING PROVIDER:  Alysia Penna, MD  REFERRING DIAG:  [phone] (ICD-10-CM) - Spondylosis without myelopathy or radiculopathy, lumbar region  M96.1 (ICD-10-CM) - Lumbar post-laminectomy syndrome    THERAPY DIAG:  Other low back pain  Muscle weakness (generalized)  Difficulty walking  ONSET DATE: March 2023  SUBJECTIVE:                                                                                                                                                                                            SUBJECTIVE STATEMENT:  "I've been taking it easy lately.  My dog has been failing, so I have been spending a lot of time with that."  I have the membership to [facility] but have not yet been there."    PERTINENT HISTORY:  wrist fx, pleuritic chest pain, osteoporosis, migraine, hypothyroidism, glaucoma, fibromyalgia, R RTC repair, anterior lateral lumbar fusion L2-12 Nov 2019   PAIN:  Are you having pain? Yes: NPRS scale: 4/10 Pain location: R  hip, L/S, R SIJ Pain description: tightness, strained muscle, pulled muscle Aggravating factors: bending, lifting, stairs, sleeping, sitting, standing too long, tighter pants Relieving factors: ice, stretching, pain pill   PRECAUTIONS: None  WEIGHT BEARING RESTRICTIONS No  FALLS:  Has patient fallen in last 6 months? No  LIVING ENVIRONMENT: Lives with: lives with their family and lives with their spouse Lives in: House/apartment Stairs: Yes 2 story home Has following equipment at home: None  OCCUPATION: retired  PLOF: Independent with basic ADLs  PATIENT GOALS : Pt states she would like to be able to reduce constant pain   OBJECTIVE:   DIAGNOSTIC FINDINGS:  IMPRESSION: 1. Interval L2-3 and L3-4 PLIF with probable solid arthrodesis. No recurrent impingement. There has been progression of degenerative disease at the remaining open lumbar levels. Mild discogenic edema at L1-2 and L5-S1. 2. L1-2 high-grade spinal stenosis and moderate left foraminal narrowing. 3. L4-5 high-grade spinal stenosis. 4. L5-S1 right more than left subarticular recess narrowing. Moderate bilateral foraminal narrowing.  PATIENT SURVEYS:  FOTO 45 49 at DC  44 FOTO 7/14 40 FOTO 8/15  LUMBAR ROM:   Active  A/PROM  08/19/2021 AROM 7/14  Flexion 75% 75%  Extension 25% p! 50% p!  Right lateral flexion 50%p! 75% p!  Left lateral flexion 50% 50%  p!  Right rotation 50% 75%  Left rotation 50% 75%   (Blank rows = not tested)  LE MMT:  myotomal  weakness of R LE noted at hip, knee, and ankle eversion, 4/5 quickly fatiguing  MMT Right 08/19/2021 Left 08/19/2021 R  7/14 L 7/14 Right / Left 8/15  Hip flexion myotomal weakness 4+/5 4/5 fatiguing 4+/5 3+/5 - 4/5  Hip abduction myotomal weakness 4+/5 4/5 fatiguing 4+/5 4/5 - 4/5  Hip adduction myotomal weakness 4+/5 4/5 fatiguing 4+/5 4+/5 - 4+/5  Knee flexion myotomal weakness 4+/5 4/5 fatiguing 4+/5 4/5 - 4/5  Knee extension myotomal weakness 4+/5 4/5 fatiguing 4+/5 4+/5 - 4+/5  Ankle dorsiflexion 4+/5 4+/5 4/5 fatiguing 4+/5 4+/5 - 4+/5  Ankle eversion myotomal weakness 4+/5 4/5 fatiguing 4+/5 4+/5    (Blank rows = not tested)  FUNCTIONAL TESTS:  5 times sit to stand: 19.1s  eval  7/14 5XSTS 13.1s 8/15 5xSTS 14.6    TODAY'S TREATMENT  Pt seen for aquatic therapy today.  Treatment took place in water 3.25-4 ft in depth at the [facility] pool. Temp of water was 90.  Pt entered/exited the pool via stairs (step to) independently with bilat rail.   Warm up: forward/ backward walking;  sidestepping Holding wall:  hip openers;  stork stance with hip IR/ER Standing kick board press (wide stance) and row (in staggered stance)with ab brace 10 each High knee marching forward/backward with reciprocal arm swing Squats pushing yellow hand buoy under water, core engaged x 20 Straddling yellow noodle, holding yellow hand buoys: cycling and add/abd; ski Holding yellow hand buoys:  3 way leg kick x 5 reps each LE x 2; hell raises x 10 Walking forward/backward for recovery LB, hamstring and gastroc stretch 2nd step holding to handrails   Pt requires buoyancy for support and to offload joints with strengthening exercises. Viscosity of the water is needed for resistance of strengthening; water current perturbations provides challenge to standing balance unsupported, requiring increased core activation.  PATIENT EDUCATION:  Education details:  exercise progression Person educated:  Patient Education method: Explanation, Demonstration, Tactile cues, Verbal cues, and Handouts Education comprehension: verbalized understanding, returned demonstration, verbal cues required, and tactile cues required   HOME EXERCISE PROGRAM: Access Code: [email or web address] URL: [email or web address] Date: 08/19/2021 Prepared by: Daleen Bo   ASSESSMENT:  CLINICAL IMPRESSION: Pt reported increased pain in Rt hip with hip abdct/add while suspended on noodle (up to 6/10); tolerated all other exercises well.  We will continue with skilled physical therapy to return pt to gains initially made then start the taper sessions establishing pool HEP and transition her to indep exercise at [facility]. Pt has cataract surgery scheduled for tomorrow and will miss~ 1 week of therapy.   Pt would benefit from continued skilled therapy in order to reach goals and maximize functional R hip strength and ROM for prevention of further functional decline.    OBJECTIVE IMPAIRMENTS Abnormal gait, decreased activity tolerance, decreased balance, decreased endurance, decreased knowledge of condition, decreased mobility, difficulty walking, decreased ROM, decreased strength, hypomobility, increased muscle spasms, impaired flexibility, improper body mechanics, postural dysfunction, and pain.   ACTIVITY LIMITATIONS cleaning, community activity, driving, occupation, laundry, yard work, shopping, and exercise .   PERSONAL FACTORS Age, Behavior pattern, Fitness, Past/current experiences, Time since onset of injury/illness/exacerbation, and 1-2 comorbidities:    are also affecting patient's functional outcome.    REHAB POTENTIAL: Fair    CLINICAL DECISION MAKING: Evolving/moderate complexity  EVALUATION COMPLEXITY: Moderate   GOALS:  SHORT TERM GOALS: Target date: 09/30/2021    Pt will become independent with HEP in order to demonstrate synthesis of PT education.  Baseline: Goal status: MET  2.  Pt will be able  to demonstrate STS with full hip extension in order to demonstrate functional improvement in R hip strength and ROM for self-care and house hold duties.  Baseline:  Goal status: MET  3.  Pt will score at least 5 pt increase on FOTO to demonstrate functional improvement in MCII and pt perceived function.   Baseline:  Goal status: ongoing   LONG TERM GOALS: Target date: 01/17/2022   Pt will become independent with final HEP in order to demonstrate synthesis of PT education.  Baseline:  Goal status: ongoing  2.  Pt will be able to perform 5XSTS in under 12 in order to demonstrate functional improvement above the cut off score for older adults.  Baseline:  Goal status: ongoing  3.  Pt will be able to demonstrate ability to perform step to pattern on stairs without UE and pain in order to demonstrate functional improvement in L/S and LE function for self-care and house hold duties.  Baseline:  Goal status: ongoing  4.  Pt will score >/= 49 on FOTO to demonstrate improvement in perceived L/S function.  Baseline:  Goal status: ongoing    PLAN: PT FREQUENCY: 1-2x/week  PT DURATION: 8 weeks  PLANNED INTERVENTIONS: Therapeutic exercises, Therapeutic activity, Neuromuscular re-education, Balance training, Gait training, Patient/Family education, Joint manipulation, Joint mobilization, Stair training, Orthotic/Fit training, Aquatic Therapy, Dry Needling, Electrical stimulation, Spinal manipulation, Spinal mobilization, Cryotherapy, Moist heat, Compression bandaging, scar mobilization, Taping, Vasopneumatic device, Traction, Ultrasound, Ionotophoresis 53m/ml Dexamethasone, and Manual therapy.  PLAN FOR NEXT SESSION:  progress aquatic HEP weekly [facility] and R hip mobility, strength, general conditioning. Add 3 way leg kick to HEP, if continues to tolerate.  JKerin Perna PTA 12/05/21 11:53 AM

## 2021-12-06 DIAGNOSIS — H2512 Age-related nuclear cataract, left eye: Secondary | ICD-10-CM | POA: Diagnosis not present

## 2021-12-06 DIAGNOSIS — H269 Unspecified cataract: Secondary | ICD-10-CM | POA: Diagnosis not present

## 2021-12-07 ENCOUNTER — Encounter (INDEPENDENT_AMBULATORY_CARE_PROVIDER_SITE_OTHER): Payer: Self-pay | Admitting: Bariatrics

## 2021-12-07 ENCOUNTER — Ambulatory Visit (INDEPENDENT_AMBULATORY_CARE_PROVIDER_SITE_OTHER): Payer: Medicare HMO | Admitting: Bariatrics

## 2021-12-07 VITALS — BP 122/74 | HR 74 | Temp 99.2°F | Ht 65.0 in | Wt 178.0 lb

## 2021-12-07 DIAGNOSIS — R7303 Prediabetes: Secondary | ICD-10-CM | POA: Diagnosis not present

## 2021-12-07 DIAGNOSIS — Z6829 Body mass index (BMI) 29.0-29.9, adult: Secondary | ICD-10-CM | POA: Diagnosis not present

## 2021-12-07 DIAGNOSIS — E785 Hyperlipidemia, unspecified: Secondary | ICD-10-CM | POA: Diagnosis not present

## 2021-12-07 DIAGNOSIS — E669 Obesity, unspecified: Secondary | ICD-10-CM

## 2021-12-13 ENCOUNTER — Encounter: Payer: Self-pay | Admitting: Physical Medicine & Rehabilitation

## 2021-12-13 ENCOUNTER — Encounter: Payer: Medicare HMO | Attending: Physical Medicine & Rehabilitation | Admitting: Physical Medicine & Rehabilitation

## 2021-12-13 VITALS — BP 106/74 | HR 70 | Ht 65.0 in | Wt 181.2 lb

## 2021-12-13 DIAGNOSIS — M47816 Spondylosis without myelopathy or radiculopathy, lumbar region: Secondary | ICD-10-CM | POA: Diagnosis not present

## 2021-12-13 DIAGNOSIS — M1611 Unilateral primary osteoarthritis, right hip: Secondary | ICD-10-CM | POA: Diagnosis not present

## 2021-12-13 NOTE — Progress Notes (Signed)
Subjective:    Patient ID: [name], female    DOB: 04/07/1946, 76 y.o.   MRN: [id number] 76 year old female with history of L2-3 L3-4 anterior fusion who returns today with complaints of right-sided low back pain.  The patient had another MRI and this was compared to prior MRI report from [facility] performed on 10/30/2019 which was performed preoperatively.  Her current MRI shows worsening of degenerative stenosis at L1-L2 as well as L4-5 and L5-S1.  Facet degenerative changes were seen mainly at L4-5 L5-S1. The patient still has symptoms occasionally going to the right big toe at night but this is not frequent.  Pain scores are in the moderate range pain worsens with walking bending sitting standing improving with rest ice and medication. She has had lumbar injections in the past with Dr. Herma Mering however she does not remember any details she does remember that 1 time she turned very red in the face and this lasted a day or 2.  The patient did have an injection using dexamethasone in June 2022 by Dr. Nelva Bush for the diagnosis of lumbar radiculopathy.  She denies having any procedures such as radiofrequency neurotomy.  HPI RIght groin pain is her main complaint.  She had an exacerbation today following using her right foot to move rags on the ground following a spill at home.  No falls.  She does continue to have low back pain as well although the hip has been more problematic as of late. Room reviewed hip x-rays looked over the actual films with the patient and her husband we compared to the right hip findings of the left hip findings this included decreased joint space in the right femoral acetabular joint as well as increased marginal osteophytes and some cortical irregularity of the femoral head.  There was subchondral sclerosis on the left side greater than right side as well.  Pain Inventory Average Pain 5 Pain Right Now 7 My pain is constant and aching  In the last 24 hours, has pain  interfered with the following? General activity 7 Relation with others 3 Enjoyment of life 2 What TIME of day is your pain at its worst? night Sleep (in general) Poor  Pain is worse with: walking, sitting, standing, and some activites Pain improves with: heat/ice, therapy/exercise, pacing activities, and medication Relief from Meds: 7  Family History  Adopted: Yes  Problem Relation Age of Onset   Cancer Neg Hx    Depression Neg Hx    Diabetes Neg Hx    Stroke Neg Hx    Hypertension Neg Hx    Heart disease Neg Hx    Social History   Socioeconomic History   Marital status: Married    Spouse name: Not on file   Number of children: 0   Years of education: Not on file   Highest education level: Not on file  Occupational History   Occupation: retired, 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  Tobacco Use   Smoking status: Former    Packs/day: 1.00    Years: 38.50    Total pack years: 38.50    Types: Cigarettes    Quit date: 01/13/2004    Years since quitting: 17.9   Smokeless tobacco: Never   Tobacco comments:    quit 2005  Vaping Use   Vaping Use: Never used  Substance and Sexual Activity   Alcohol use: No    Alcohol/week: 0.0 standard drinks of alcohol    Comment: rare   Drug  use: No   Sexual activity: Not Currently    Comment: first intercourse >16  Other Topics Concern   Not on file  Social History Narrative   No biological children      Social Determinants of Health   Financial Resource Strain: Low Risk  (08/18/2021)   Overall Financial Resource Strain (CARDIA)    Difficulty of Paying Living Expenses: Not hard at all  Food Insecurity: No Food Insecurity (08/18/2021)   Hunger Vital Sign    Worried About Running Out of Food in the Last Year: Never true    Ran Out of Food in the Last Year: Never true  Transportation Needs: No Transportation Needs (08/18/2021)   PRAPARE - Hydrologist (Medical): No    Lack of Transportation  (Non-Medical): No  Physical Activity: Sufficiently Active (08/18/2021)   Exercise Vital Sign    Days of Exercise per Week: 7 days    Minutes of Exercise per Session: 30 min  Stress: No Stress Concern Present (08/18/2021)   [place]    Feeling of Stress : Not at all  Social Connections: Moderately Integrated (08/18/2021)   Social Connection and Isolation Panel [NHANES]    Frequency of Communication with Friends and Family: More than three times a week    Frequency of Social Gatherings with Friends and Family: More than three times a week    Attends Religious Services: Never    Marine scientist or Organizations: Yes    Attends Music therapist: More than 4 times per year    Marital Status: Married   Past Surgical History:  Procedure Laterality Date   ANTERIOR LAT LUMBAR FUSION N/A 12/03/2019   Procedure: ANTERIOR LATERAL LUMBAR FUSION L2-4;  Surgeon: Melina Schools, MD;  Location: [place];  Service: Orthopedics;  Laterality: N/A;  4 hrs   COLONOSCOPY     COSMETIC SURGERY  04/12/2018   L upper eyelid ptosis repair, and trichophytic brow lift   ESOPHAGOGASTRODUODENOSCOPY (EGD) WITH PROPOFOL N/A 03/27/2013   Procedure: ESOPHAGOGASTRODUODENOSCOPY (EGD) WITH PROPOFOL;  Surgeon: Milus Banister, MD;  [facility];  Service: Endoscopy;  Laterality: N/A;  possible dil   EYE SURGERY     growth on cornea-bil.   plastic surgery face  2013   POLYPECTOMY     right rotator cuff repair  2010   TONSILLECTOMY     UPPER GASTROINTESTINAL ENDOSCOPY     Past Surgical History:  Procedure Laterality Date   ANTERIOR LAT LUMBAR FUSION N/A 12/03/2019   Procedure: ANTERIOR LATERAL LUMBAR FUSION L2-4;  Surgeon: Melina Schools, MD;  Location: [place];  Service: Orthopedics;  Laterality: N/A;  4 hrs   COLONOSCOPY     COSMETIC SURGERY  04/12/2018   L upper eyelid ptosis repair, and trichophytic brow lift    ESOPHAGOGASTRODUODENOSCOPY (EGD) WITH PROPOFOL N/A 03/27/2013   Procedure: ESOPHAGOGASTRODUODENOSCOPY (EGD) WITH PROPOFOL;  Surgeon: Milus Banister, MD;  [facility];  Service: Endoscopy;  Laterality: N/A;  possible dil   EYE SURGERY     growth on cornea-bil.   plastic surgery face  2013   POLYPECTOMY     right rotator cuff repair  2010   TONSILLECTOMY     UPPER GASTROINTESTINAL ENDOSCOPY     Past Medical History:  Diagnosis Date   Arthritis    Back pain    Back pain    Cataract    both eyes rt. eye was  removed   Chest pain    stress test @ [place] [facility] (-)   Colonic polyp    bx adenomatous polyps, next scope 2010   Constipation    COPD (chronic obstructive pulmonary disease) ([place])    Cornea disorder    NODULES REMOVED FROM CORNEA   Fibromyalgia    see rheumatology   GERD (gastroesophageal reflux disease)    PAST HX    High blood pressure    High cholesterol    Hyperlipidemia    Hypothyroidism    Joint pain    Migraine    Ocular rosacea 03/2009   on doxy   Osteoporosis    osteopenia   Pleuritic chest pain    etiology unclear, has beeb eval by pulmonary and rheumatology (autoimmune dz?)   Swelling of lower extremity    Wrist fracture 2018   BP 106/74   Pulse 70   Ht '5\' 5"'$  (1.651 m)   Wt 181 lb 3.2 oz (82.2 kg)   LMP 04/10/1998 (Approximate)   SpO2 95%   BMI 30.15 kg/m   Opioid Risk Score:   Fall Risk Score:  `1  Depression screen [facility] 2/9     11/23/2021    9:04 AM 11/10/2021    9:58 AM 09/16/2021   11:07 AM 08/18/2021    8:30 AM 07/26/2021   10:55 AM 07/22/2021    1:12 PM 06/03/2021   10:08 AM  Depression screen PHQ 2/9  Decreased Interest 1 0 0 0 0 0 0  Down, Depressed, Hopeless 1 0 0 0 0 0 0  PHQ - 2 Score 2 0 0 0 0 0 0  Altered sleeping 3 0   0    Tired, decreased energy 2 0   0    Change in appetite 1 0   0    Feeling bad or failure about yourself  1 0   0    Trouble concentrating 0 0   0    Moving slowly or fidgety/restless 0 0    0    Suicidal thoughts 0 0   0    PHQ-9 Score 9 0   0    Difficult doing work/chores Not difficult at all          Review of Systems  Musculoskeletal:  Positive for back pain.       Right hip pain  All other systems reviewed and are negative.      Objective:   Physical Exam Vitals and nursing note reviewed.  Constitutional:      Appearance: She is obese.  HENT:     Head: Normocephalic and atraumatic.  Eyes:     Extraocular Movements: Extraocular movements intact.     Conjunctiva/sclera: Conjunctivae normal.     Pupils: Pupils are equal, round, and reactive to light.  Musculoskeletal:     Comments: There is tenderness palpation along the lumbar paraspinal area particular on the right side there is pain with left lateral leaning but the pain is on the right side. There is pain with lumbar extension but no pain with lumbar flexion. Negative thigh thrust test bilaterally Negative Faber's bilaterally There is pain with internal rotation of the right hip as well as reduced internal rotation range of motion on the right side.  Skin:    General: Skin is warm and dry.  Neurological:     Mental Status: She is alert and oriented to person, place, and time.     Comments: Sensation normal to light  touch bilateral L3-4-5 dermatomal distribution Negative straight leg raise bilaterally Motor strength is 5/5 bilateral hip flexor knee extensor ankle dorsiflexor Gait without evidence of toe drag or knee instability does not require assistive device.  Psychiatric:        Mood and Affect: Mood normal.        Behavior: Behavior normal.           Assessment & Plan:   #1.  Chronic right groin pain reviewed MRI results of the lumbar spine demonstrating L1-2 stenosis and nerve root impingement which could cause groin pain however x-ray findings of moderate DJD as well as physical gait exam findings of decreased internal rotation suggest that the groin pain is most likely right hip joint.  We  discussed additional diagnostic/therapeutic injection of the right femoral acetabular joint under fluoroscopic guidance.  We also discussed her iodine allergy and that she has received Omnipaque in 2007 for CT scan without incident.  She was premedicated with oral diphenhydramine 50 mg we discussed this would be an optional thing for her but not really required given she has no dye allergy with noniodinated contrast 2.  Chronic low back pain) left side lower lumbar area corresponds to L4-L5 L5-S1 facet arthropathy.  Corresponding exam findings.  Physical therapy including aquatic therapy have not been helpful.  She is not a good candidate for NSAIDs given her age as well as GERD we discussed she can try some Tylenol continue with her aquatic exercise.  Next step would be medial branch blocks which were denied by insurance before.  Patient does not have any signs of radiculopathy on examination.  Patient has axial back pain  Discussed recommendations with patient and her husband she will decide whether she wishes to proceed with right hip intra-articular injection under fluoroscopic guidance.  She will call

## 2021-12-19 ENCOUNTER — Encounter (HOSPITAL_BASED_OUTPATIENT_CLINIC_OR_DEPARTMENT_OTHER): Payer: Medicare HMO | Admitting: Physical Therapy

## 2021-12-19 NOTE — Progress Notes (Unsigned)
Chief Complaint:   OBESITY [name] is here to discuss her progress with her obesity treatment plan along with follow-up of her obesity related diagnoses. [name] is on the Category 1 Plan and states she is following her eating plan approximately 95% of the time. [name] states she is doing 0 minutes 0 times per week.  Today's visit was #: 2 Starting weight: 184 lbs Starting date: 11/23/2021 Today's weight: 178 lbs Today's date: 12/07/2021 Total lbs lost to date: 6 Total lbs lost since last in-office visit: 6  Interim History: Marcha is down an additional 6 pounds since her first visit.  Subjective:   1. Prediabetes [name] states her A1c fluctuates.  Her last A1c was 0.7.  2. Hyperlipidemia, unspecified hyperlipidemia type [name] is taking Pravachol.  Assessment/Plan:   1. Prediabetes Handouts on insulin resistance and prediabetes were given to the patient today.  2. Hyperlipidemia, unspecified hyperlipidemia type [name] will continue Pravachol as directed.  3. Obesity, current BMI 29.8 [name] is currently in the action stage of change. As such, her goal is to continue with weight loss efforts. She has agreed to the Category 1 Plan.   Meal planning and intentional eating were discussed.  Review labs with the patient from 11/23/2021, A1c, insulin, glucose, vitamin D, and B12.  Smart fruit options and protein equivalents were given.  Exercise goals: Pool exercise.  Behavioral modification strategies: increasing lean protein intake, decreasing simple carbohydrates, increasing vegetables, increasing water intake, decreasing eating out, no skipping meals, meal planning and cooking strategies, keeping healthy foods in the home, and planning for success.  [name] has agreed to follow-up with our clinic in 2 to 3 weeks. She was informed of the importance of frequent follow-up visits to maximize her success with intensive lifestyle modifications for her multiple health  conditions.   Objective:   Blood pressure 122/74, pulse 74, temperature 99.2 F (37.3 C), height '5\' 5"'$  (1.651 m), weight 178 lb (80.7 kg), last menstrual period 04/10/1998, SpO2 97 %. Body mass index is 29.62 kg/m.  General: Cooperative, alert, well developed, in no acute distress. HEENT: Conjunctivae and lids unremarkable. Cardiovascular: Regular rhythm.  Lungs: Normal work of breathing. Neurologic: No focal deficits.   Lab Results  Component Value Date   CREATININE 0.82 11/10/2021   BUN 14 11/10/2021   NA 141 11/10/2021   K 3.5 11/10/2021   CL 101 11/10/2021   CO2 32 11/10/2021   Lab Results  Component Value Date   ALT 11 07/26/2021   AST 14 07/26/2021   ALKPHOS 78 10/18/2020   BILITOT 0.6 10/18/2020   Lab Results  Component Value Date   HGBA1C 5.7 (H) 11/23/2021   HGBA1C 5.8 03/24/2021   HGBA1C 5.5 05/22/2015   Lab Results  Component Value Date   INSULIN 8.7 11/23/2021   Lab Results  Component Value Date   TSH 1.97 11/10/2021   Lab Results  Component Value Date   CHOL 154 07/26/2021   HDL 54.30 07/26/2021   LDLCALC 71 07/26/2021   LDLDIRECT 92.0 03/04/2007   TRIG 143.0 07/26/2021   CHOLHDL 3 07/26/2021   Lab Results  Component Value Date   VD25OH 47.1 11/23/2021   VD25OH 44.87 10/21/2021   VD25OH 46.9 10/05/2020   Lab Results  Component Value Date   WBC 6.8 11/10/2021   HGB 13.7 11/10/2021   HCT 40.7 11/10/2021   MCV 91.0 11/10/2021   PLT 281.0 11/10/2021   No results found for: "IRON", "TIBC", "FERRITIN"  Attestation Statements:  Reviewed by clinician on day of visit: allergies, medications, problem list, medical history, surgical history, family history, social history, and previous encounter notes.   [name], am acting as Location manager for [employer], DO.  I have reviewed the above documentation for accuracy and completeness, and I agree with the above. Michelle Lesch, DO

## 2021-12-21 ENCOUNTER — Encounter (INDEPENDENT_AMBULATORY_CARE_PROVIDER_SITE_OTHER): Payer: Self-pay | Admitting: Bariatrics

## 2021-12-21 ENCOUNTER — Other Ambulatory Visit: Payer: Self-pay | Admitting: Internal Medicine

## 2021-12-22 ENCOUNTER — Ambulatory Visit (HOSPITAL_BASED_OUTPATIENT_CLINIC_OR_DEPARTMENT_OTHER): Payer: Medicare HMO | Admitting: Physical Therapy

## 2021-12-27 ENCOUNTER — Ambulatory Visit (HOSPITAL_BASED_OUTPATIENT_CLINIC_OR_DEPARTMENT_OTHER): Payer: Medicare HMO | Admitting: Physical Therapy

## 2021-12-27 DIAGNOSIS — Z0100 Encounter for examination of eyes and vision without abnormal findings: Secondary | ICD-10-CM | POA: Diagnosis not present

## 2021-12-29 ENCOUNTER — Other Ambulatory Visit (HOSPITAL_BASED_OUTPATIENT_CLINIC_OR_DEPARTMENT_OTHER): Payer: Self-pay

## 2021-12-29 ENCOUNTER — Ambulatory Visit (HOSPITAL_BASED_OUTPATIENT_CLINIC_OR_DEPARTMENT_OTHER): Payer: Medicare HMO | Admitting: Physical Therapy

## 2021-12-29 ENCOUNTER — Other Ambulatory Visit: Payer: Self-pay | Admitting: Family Medicine

## 2021-12-29 MED ORDER — HYDROCODONE-ACETAMINOPHEN 7.5-325 MG PO TABS
1.0000 | ORAL_TABLET | Freq: Four times a day (QID) | ORAL | 0 refills | Status: DC | PRN
  Filled 2021-12-29: qty 120, 30d supply, fill #0

## 2021-12-29 MED ORDER — HYDROCODONE-ACETAMINOPHEN 7.5-325 MG PO TABS
1.0000 | ORAL_TABLET | Freq: Four times a day (QID) | ORAL | 0 refills | Status: DC | PRN
  Filled 2021-12-29 – 2022-01-30 (×2): qty 120, 30d supply, fill #0

## 2021-12-29 NOTE — Telephone Encounter (Signed)
PDMP okay, Rx sent to your pharmacy

## 2021-12-29 NOTE — Telephone Encounter (Signed)
Requesting: hydrocodone 7.5-'325mg'$   Contract: 11/10/21 UDS: 11/10/21 Last Visit: 11/10/21 Next Visit: 04/13/22 Last Refill: 11/30/21 #120 and 0RF  Please Advise

## 2022-01-03 ENCOUNTER — Ambulatory Visit (HOSPITAL_BASED_OUTPATIENT_CLINIC_OR_DEPARTMENT_OTHER): Payer: Medicare HMO | Admitting: Physical Therapy

## 2022-01-05 ENCOUNTER — Ambulatory Visit: Payer: Medicare HMO | Admitting: Internal Medicine

## 2022-01-05 ENCOUNTER — Ambulatory Visit (HOSPITAL_BASED_OUTPATIENT_CLINIC_OR_DEPARTMENT_OTHER): Payer: Medicare HMO | Admitting: Physical Therapy

## 2022-01-09 ENCOUNTER — Ambulatory Visit (HOSPITAL_BASED_OUTPATIENT_CLINIC_OR_DEPARTMENT_OTHER): Payer: Medicare HMO | Admitting: Physical Therapy

## 2022-01-12 ENCOUNTER — Ambulatory Visit (HOSPITAL_BASED_OUTPATIENT_CLINIC_OR_DEPARTMENT_OTHER): Payer: Medicare HMO | Attending: Physical Medicine & Rehabilitation | Admitting: Physical Therapy

## 2022-01-12 ENCOUNTER — Encounter (HOSPITAL_BASED_OUTPATIENT_CLINIC_OR_DEPARTMENT_OTHER): Payer: Self-pay | Admitting: Physical Therapy

## 2022-01-12 DIAGNOSIS — M25551 Pain in right hip: Secondary | ICD-10-CM | POA: Diagnosis not present

## 2022-01-12 DIAGNOSIS — R262 Difficulty in walking, not elsewhere classified: Secondary | ICD-10-CM

## 2022-01-12 DIAGNOSIS — M5459 Other low back pain: Secondary | ICD-10-CM | POA: Diagnosis not present

## 2022-01-12 DIAGNOSIS — M6281 Muscle weakness (generalized): Secondary | ICD-10-CM

## 2022-01-12 NOTE — Therapy (Addendum)
OUTPATIENT PHYSICAL THERAPY PROGRESS NOTE           Patient Name: [name] MRN: [id number] DOB:Jul 31, 1945, 76 y.o., female Today's Date: 01/12/2022   PT End of Session - 01/12/22 1109     Visit Number 13    Number of Visits 21    Date for PT Re-Evaluation 04/12/22    Authorization Type [employer] MCR    Progress Note Due on Visit 19    PT Start Time 1100    PT Stop Time 1135    PT Time Calculation (min) 35 min    Activity Tolerance Patient tolerated treatment well    Behavior During Therapy WFL for tasks assessed/performed               Past Medical History:  Diagnosis Date   Arthritis    Back pain    Back pain    Cataract    both eyes rt. 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   Fibromyalgia    see rheumatology   GERD (gastroesophageal reflux disease)    PAST HX    High blood pressure    High cholesterol    Hyperlipidemia    Hypothyroidism    Joint pain    Migraine    Ocular rosacea 03/2009   on doxy   Osteoporosis    osteopenia   Pleuritic chest pain    etiology unclear, has beeb eval by pulmonary and rheumatology (autoimmune dz?)   Swelling of lower extremity    Wrist fracture 2018   Past Surgical History:  Procedure Laterality Date   ANTERIOR LAT LUMBAR FUSION N/A 12/03/2019   Procedure: ANTERIOR LATERAL LUMBAR FUSION L2-4;  Surgeon: Melina Schools, MD;  Location: [facility];  Service: Orthopedics;  Laterality: N/A;  4 hrs   COLONOSCOPY     COSMETIC SURGERY  04/12/2018   L upper eyelid ptosis repair, and trichophytic brow lift   ESOPHAGOGASTRODUODENOSCOPY (EGD) WITH PROPOFOL N/A 03/27/2013   Procedure: ESOPHAGOGASTRODUODENOSCOPY (EGD) WITH PROPOFOL;  Surgeon: Milus Banister, MD;  Location: [facility];  Service: Endoscopy;  Laterality: N/A;   possible dil   EYE SURGERY     growth on cornea-bil.   plastic surgery face  2013   POLYPECTOMY     right rotator cuff repair  2010   TONSILLECTOMY     UPPER GASTROINTESTINAL ENDOSCOPY     Patient Active Problem List   Diagnosis Date Noted   Primary osteoarthritis of right hip 12/13/2021   Spondylosis without myelopathy or radiculopathy, lumbar region 12/13/2021   Prediabetes 11/24/2021   Other fatigue 11/23/2021   SOB (shortness of breath) on exertion 11/23/2021   Hyperlipidemia 11/23/2021   Hyperglycemia 11/23/2021   Vitamin D deficiency 11/23/2021   Vitamin B 12 deficiency 11/23/2021   Class 1 obesity with serious comorbidity and body mass index (BMI) of 30.0 to 30.9 in adult 11/23/2021   Fusion of lumbar spine 12/03/2019   COPD (chronic obstructive pulmonary disease) ([place]) 08/22/2019   Spinal stenosis of lumbar region with neurogenic claudication 11/22/2018   Coronary artery disease due to lipid rich plaque 10/03/2018   Closed fracture of distal end of radius 04/16/2017   Chronic back pain 03/15/2017   PCP NOTES >>>>>>>>>>>>>>>>>>>> 05/24/2015  Numbness and tingling of right arm 05/22/2015   Insomnia 09/01/2014   GERD (gastroesophageal reflux disease) 03/27/2013   Dysphagia, pharyngoesophageal phase 03/27/2013   Annual physical exam 06/12/2011   Sinusitis, chronic 02/02/2011   Osteoporosis 06/08/2010   Fibromyalgia, pain mngmt 01/29/2009   LIPOMA 09/30/2008   Migraine without aura 03/04/2007   Hypothyroidism 05/15/2006   COLONIC POLYPS, HX OF 05/15/2006    PCP:  Kathlene November, MD  REFERRING PROVIDER:  Alysia Penna, MD  REFERRING DIAG:  [phone] (ICD-10-CM) - Spondylosis without myelopathy or radiculopathy, lumbar region  M96.1 (ICD-10-CM) - Lumbar post-laminectomy syndrome    THERAPY DIAG:  Other low back pain  Muscle weakness (generalized)  Difficulty walking  Pain in right hip  ONSET DATE: March 2023  SUBJECTIVE:                                                                                                                                                                                            SUBJECTIVE STATEMENT:  Pt has not been doing home therapy recently due to surgery and recently having to put down her dog. Pt is likely going to have a injection into the R hip after finding hip OA. Pt states the back pain has not been bad but maybe due to resting the hip. She is considering potential R THA at some point.  Pt has not been [facility] self [facility]. Pt is going back to eye MD for report of eye pain.     PERTINENT HISTORY:  wrist fx, pleuritic chest pain, osteoporosis, migraine, hypothyroidism, glaucoma, fibromyalgia, R RTC repair, anterior lateral lumbar fusion L2-12 Nov 2019   PAIN:  Are you having pain? Yes: NPRS scale: 4-5/10 Pain location: R  hip Pain description: tightness, strained muscle, pulled muscle Aggravating factors: bending, lifting, stairs, sleeping, sitting, standing too long, tighter pants Relieving factors: ice, stretching, pain pill   PRECAUTIONS: None  WEIGHT BEARING RESTRICTIONS No  FALLS:  Has patient fallen in last 6 months? No  LIVING ENVIRONMENT: Lives with: lives with their family and lives with their spouse Lives in: House/apartment Stairs: Yes 2 story home Has following equipment at home: None  OCCUPATION: retired  PLOF: Independent with basic ADLs  PATIENT GOALS : Pt states she would like to be able to reduce constant pain   OBJECTIVE:   DIAGNOSTIC FINDINGS:  IMPRESSION: 1. Interval L2-3 and L3-4 PLIF with probable solid arthrodesis. No recurrent impingement. There has been progression of degenerative disease at the remaining open lumbar levels. Mild discogenic edema at L1-2 and L5-S1. 2. L1-2 high-grade spinal stenosis and moderate left foraminal narrowing.  3. L4-5 high-grade spinal stenosis. 4. L5-S1 right more than left subarticular recess narrowing. Moderate  bilateral foraminal narrowing.  PATIENT SURVEYS:  FOTO 45 49 at DC  44 FOTO 7/14 40 FOTO 8/15 40 FOTO 10/5  LUMBAR ROM:   Active  A/PROM  08/19/2021 AROM 7/14 10/5  Flexion 75% 75% 75%  Extension 25% p! 50% p! 50%  Right lateral flexion 50%p! 75% p! 75%  Left lateral flexion 50% 50% p! 75%  Right rotation 50% 75% 75%  Left rotation 50% 75% 75%   (Blank rows = not tested)  LE MMT:  myotomal weakness of R LE noted at hip, knee, and ankle eversion, 4/5 quickly fatiguing  MMT Right 08/19/2021 Left 08/19/2021 R  7/14 L 7/14 Right / Left 8/15 Right / Left 10/5  Hip flexion myotomal weakness 4+/5 4/5 fatiguing 4+/5 3+/5 - 4/5 3+/5 - 4/5  Hip abduction myotomal weakness 4+/5 4/5 fatiguing 4+/5 4/5 - 4/5 4/5 - 4/5  Hip adduction myotomal weakness 4+/5 4/5 fatiguing 4+/5 4+/5 - 4+/5 4+/5 - 4+/5  Knee flexion myotomal weakness 4+/5 4/5 fatiguing 4+/5 4/5 - 4/5 4/5 - 4/5  Knee extension myotomal weakness 4+/5 4/5 fatiguing 4+/5 4+/5 - 4+/5 4+/5 - 4+/5  Ankle dorsiflexion 4+/5 4+/5 4/5 fatiguing 4+/5 4+/5 - 4+/5 4+/5 - 4+/5  Ankle eversion myotomal weakness 4+/5 4/5 fatiguing 4+/5 4+/5  4+/5    (Blank rows = not tested)  FUNCTIONAL TESTS:  5 times sit to stand: 19.1s  eval 7/14 5XSTS 13.1s 8/15 5xSTS 14.6 10/5 5XSTS: 13.2s    TODAY'S TREATMENT   10/5  Review of HEP, trimming for better compliance and consistency, exercise at [facility], exam results  Exercises - Seated Quadratus Lumborum Stretch in Chair  - 2 x daily - 7 x weekly - 1 sets - 3 reps - 30 hold - Sit to Stand with Resistance Around Legs  - 1 x daily - 7 x weekly - 3 sets - 5 reps - Supine Bridge  - 1 x daily - 7 x weekly - 2 sets - 10 reps - single knee to chest (perform in pool with back against wall)  - 1 x daily - 7 x weekly - 1 sets - 10 reps - 5 hold - Squat  - 1 x daily - 7 x weekly - 2 sets - 10 reps - Side Stepping  - 1 x daily - 7 x weekly - 1 sets - 3 reps - Backward Walking  - 1 x daily - 7 x weekly - 1  sets - 3 reps - Forward Walking  - 1 x daily - 7 x weekly - 1 sets - 3 reps - Standing March at [employer]  - 1 x daily - 7 x weekly - 2 sets - 10 reps - Cat Cow in Shallow Water with Pool Noodle  - 1 x daily - 7 x weekly - 1 sets - 10 reps - Forward March  - 1 x daily - 7 x weekly - 1 sets - 1 reps   Previous: Pt seen for aquatic therapy today.  Treatment took place in water 3.25-4 ft in depth at the [facility] pool. Temp of water was 90.  Pt entered/exited the pool via stairs (step to) independently with bilat rail.   Warm up: forward/ backward walking;  sidestepping Holding wall:  hip openers;  stork stance with hip IR/ER Standing kick board press (wide stance) and row (in staggered stance)with ab brace 10 each High  knee marching forward/backward with reciprocal arm swing Squats pushing yellow hand buoy under water, core engaged x 20 Straddling yellow noodle, holding yellow hand buoys: cycling and add/abd; ski Holding yellow hand buoys:  3 way leg kick x 5 reps each LE x 2; hell raises x 10 Walking forward/backward for recovery LB, hamstring and gastroc stretch 2nd step holding to handrails   Pt requires buoyancy for support and to offload joints with strengthening exercises. Viscosity of the water is needed for resistance of strengthening; water current perturbations provides challenge to standing balance unsupported, requiring increased core activation.  PATIENT EDUCATION:  Education details:  anatomy, exercise progression, HEP, POC Person educated: Patient Education method: Explanation, Demonstration, Tactile cues, Verbal cues, and Handouts Education comprehension: verbalized understanding, returned demonstration, verbal cues required, and tactile cues required   HOME EXERCISE PROGRAM: Access Code: [email or web address] URL: [email or web address] Date: 08/19/2021 Prepared by: Daleen Bo   ASSESSMENT:  CLINICAL IMPRESSION:  Pt return to therapy today after gap  due to eye surgery and recent death of her family dog. Pt functionally has not regressed but is still limited due to R hip pain. Pt's back pain and ROM has improved but the recent imaging of R hip does show progressive hip OA which will effect functional mobility. Pt to finish out her current visits and then plan to reassess need, should pt progress towards having a THA. Pt has would benefit from finalizing aquatic therapy and HEP in order to improve functional capacity, should she and surgeon decide on THA. From a back perspective, pt's pain has been better managed recently. Plan to continue with R hip and hip strength to tolerance.   Pt would benefit from continued skilled therapy in order to reach goals and maximize functional R hip strength and ROM for prevention of further functional decline.    OBJECTIVE IMPAIRMENTS Abnormal gait, decreased activity tolerance, decreased balance, decreased endurance, decreased knowledge of condition, decreased mobility, difficulty walking, decreased ROM, decreased strength, hypomobility, increased muscle spasms, impaired flexibility, improper body mechanics, postural dysfunction, and pain.   ACTIVITY LIMITATIONS cleaning, community activity, driving, occupation, laundry, yard work, shopping, and exercise .   PERSONAL FACTORS Age, Behavior pattern, Fitness, Past/current experiences, Time since onset of injury/illness/exacerbation, and 1-2 comorbidities:    are also affecting patient's functional outcome.    REHAB POTENTIAL: Fair    CLINICAL DECISION MAKING: Evolving/moderate complexity  EVALUATION COMPLEXITY: Moderate   GOALS:   SHORT TERM GOALS: Target date: 09/30/2021    Pt will become independent with HEP in order to demonstrate synthesis of PT education.  Baseline: Goal status: MET  2.  Pt will be able to demonstrate STS with full hip extension in order to demonstrate functional improvement in R hip strength and ROM for self-care and house hold  duties.  Baseline:  Goal status: MET  3.  Pt will score at least 5 pt increase on FOTO to demonstrate functional improvement in MCII and pt perceived function.   Baseline:  Goal status: ongoing   LONG TERM GOALS: Target date: 01/17/2022   Pt will become independent with final HEP in order to demonstrate synthesis of PT education.  Baseline:  Goal status: ongoing  2.  Pt will be able to perform 5XSTS in under 12 in order to demonstrate functional improvement above the cut off score for older adults.  Baseline:  Goal status: ongoing  3.  Pt will be able to demonstrate ability to perform step to pattern on stairs without UE and pain in order  to demonstrate functional improvement in L/S and LE function for self-care and house hold duties.  Baseline:  Goal status: ongoing  4.  Pt will score >/= 49 on FOTO to demonstrate improvement in perceived L/S function.  Baseline:  Goal status: ongoing    PLAN: PT FREQUENCY: 1-2x/week  PT DURATION: 8 weeks  PLANNED INTERVENTIONS: Therapeutic exercises, Therapeutic activity, Neuromuscular re-education, Balance training, Gait training, Patient/Family education, Joint manipulation, Joint mobilization, Stair training, Orthotic/Fit training, Aquatic Therapy, Dry Needling, Electrical stimulation, Spinal manipulation, Spinal mobilization, Cryotherapy, Moist heat, Compression bandaging, scar mobilization, Taping, Vasopneumatic device, Traction, Ultrasound, Ionotophoresis 38m/ml Dexamethasone, and Manual therapy.  PLAN FOR NEXT SESSION:  progress aquatic HEP weekly [facility] and R hip mobility, strength, general conditioning. Add 3 way leg kick to HEP, if continues to tolerate.  ADaleen BoPT, DPT 01/12/22 12:38 PM

## 2022-01-13 DIAGNOSIS — H04129 Dry eye syndrome of unspecified lacrimal gland: Secondary | ICD-10-CM | POA: Diagnosis not present

## 2022-01-13 DIAGNOSIS — H18452 Nodular corneal degeneration, left eye: Secondary | ICD-10-CM | POA: Diagnosis not present

## 2022-01-13 DIAGNOSIS — Z961 Presence of intraocular lens: Secondary | ICD-10-CM | POA: Diagnosis not present

## 2022-01-13 DIAGNOSIS — H02889 Meibomian gland dysfunction of unspecified eye, unspecified eyelid: Secondary | ICD-10-CM | POA: Diagnosis not present

## 2022-01-16 ENCOUNTER — Ambulatory Visit (HOSPITAL_BASED_OUTPATIENT_CLINIC_OR_DEPARTMENT_OTHER): Payer: Medicare HMO | Admitting: Physical Therapy

## 2022-01-16 ENCOUNTER — Encounter: Payer: Self-pay | Admitting: Bariatrics

## 2022-01-16 ENCOUNTER — Ambulatory Visit (INDEPENDENT_AMBULATORY_CARE_PROVIDER_SITE_OTHER): Payer: Medicare HMO | Admitting: Bariatrics

## 2022-01-16 ENCOUNTER — Encounter (HOSPITAL_BASED_OUTPATIENT_CLINIC_OR_DEPARTMENT_OTHER): Payer: Self-pay | Admitting: Physical Therapy

## 2022-01-16 VITALS — BP 108/69 | HR 69 | Temp 98.0°F | Ht 65.0 in | Wt 170.0 lb

## 2022-01-16 DIAGNOSIS — R262 Difficulty in walking, not elsewhere classified: Secondary | ICD-10-CM | POA: Diagnosis not present

## 2022-01-16 DIAGNOSIS — Z6828 Body mass index (BMI) 28.0-28.9, adult: Secondary | ICD-10-CM | POA: Diagnosis not present

## 2022-01-16 DIAGNOSIS — M6281 Muscle weakness (generalized): Secondary | ICD-10-CM

## 2022-01-16 DIAGNOSIS — E7849 Other hyperlipidemia: Secondary | ICD-10-CM | POA: Diagnosis not present

## 2022-01-16 DIAGNOSIS — E669 Obesity, unspecified: Secondary | ICD-10-CM

## 2022-01-16 DIAGNOSIS — M5459 Other low back pain: Secondary | ICD-10-CM | POA: Diagnosis not present

## 2022-01-16 DIAGNOSIS — R7303 Prediabetes: Secondary | ICD-10-CM | POA: Diagnosis not present

## 2022-01-16 DIAGNOSIS — M25551 Pain in right hip: Secondary | ICD-10-CM

## 2022-01-16 NOTE — Therapy (Signed)
OUTPATIENT PHYSICAL THERAPY PROGRESS NOTE           Patient Name: [name] MRN: [id number] DOB:29-Jan-1946, 76 y.o., female Today's Date: 01/16/2022   PT End of Session - 01/16/22 1120     Visit Number 14    Number of Visits 21    Date for PT Re-Evaluation 04/12/22    Authorization Type [employer]    Authorization Time Period 01/12/22-04/12/22    Progress Note Due on Visit 19    PT Start Time 1117    PT Stop Time 1158    PT Time Calculation (min) 41 min    Activity Tolerance Patient tolerated treatment well    Behavior During Therapy WFL for tasks assessed/performed               Past Medical History:  Diagnosis Date   Arthritis    Back pain    Back pain    Cataract    both eyes rt. eye was removed   Chest pain    stress test @ [place] [facility] (-)   Colonic polyp    bx adenomatous polyps, next scope 2010   Constipation    COPD (chronic obstructive pulmonary disease) ([place])    Cornea disorder    NODULES REMOVED FROM CORNEA   Fibromyalgia    see rheumatology   GERD (gastroesophageal reflux disease)    PAST HX    High blood pressure    High cholesterol    Hyperlipidemia    Hypothyroidism    Joint pain    Migraine    Ocular rosacea 03/2009   on doxy   Osteoporosis    osteopenia   Pleuritic chest pain    etiology unclear, has beeb eval by pulmonary and rheumatology (autoimmune dz?)   Swelling of lower extremity    Wrist fracture 2018   Past Surgical History:  Procedure Laterality Date   ANTERIOR LAT LUMBAR FUSION N/A 12/03/2019   Procedure: ANTERIOR LATERAL LUMBAR FUSION L2-4;  Surgeon: Melina Schools, MD;  Location: [place];  Service: Orthopedics;  Laterality: N/A;  4 hrs   COLONOSCOPY     COSMETIC SURGERY  04/12/2018   L upper eyelid ptosis repair, and trichophytic brow lift   ESOPHAGOGASTRODUODENOSCOPY (EGD) WITH PROPOFOL N/A 03/27/2013   Procedure: ESOPHAGOGASTRODUODENOSCOPY (EGD) WITH PROPOFOL;  Surgeon: Milus Banister, MD;  Location: [facility]  ENDOSCOPY;  Service: Endoscopy;  Laterality: N/A;  possible dil   EYE SURGERY     growth on cornea-bil.   plastic surgery face  2013   POLYPECTOMY     right rotator cuff repair  2010   TONSILLECTOMY     UPPER GASTROINTESTINAL ENDOSCOPY     Patient Active Problem List   Diagnosis Date Noted   Primary osteoarthritis of right hip 12/13/2021   Spondylosis without myelopathy or radiculopathy, lumbar region 12/13/2021   Prediabetes 11/24/2021   Other fatigue 11/23/2021   SOB (shortness of breath) on exertion 11/23/2021   Hyperlipidemia 11/23/2021   Hyperglycemia 11/23/2021   Vitamin D deficiency 11/23/2021   Vitamin B 12 deficiency 11/23/2021   Class 1 obesity with serious comorbidity and body mass index (BMI) of 30.0 to 30.9 in adult 11/23/2021   Fusion of lumbar spine 12/03/2019   COPD (chronic obstructive pulmonary disease) ([place]) 08/22/2019   Spinal stenosis of lumbar region with neurogenic claudication 11/22/2018   Coronary artery disease due to lipid rich plaque 10/03/2018   Closed fracture of distal end of radius 04/16/2017   Chronic back pain  03/15/2017   PCP NOTES >>>>>>>>>>>>>>>>>>>> 05/24/2015   Numbness and tingling of right arm 05/22/2015   Insomnia 09/01/2014   GERD (gastroesophageal reflux disease) 03/27/2013   Dysphagia, pharyngoesophageal phase 03/27/2013   Annual physical exam 06/12/2011   Sinusitis, chronic 02/02/2011   Osteoporosis 06/08/2010   Fibromyalgia, pain mngmt 01/29/2009   LIPOMA 09/30/2008   Migraine without aura 03/04/2007   Hypothyroidism 05/15/2006   COLONIC POLYPS, HX OF 05/15/2006    PCP:  Kathlene November, MD  REFERRING PROVIDER:  Alysia Penna, MD  REFERRING DIAG:  [phone] (ICD-10-CM) - Spondylosis without myelopathy or radiculopathy, lumbar region  M96.1 (ICD-10-CM) - Lumbar post-laminectomy syndrome    THERAPY DIAG:  Other low back pain  Pain in right hip  Muscle weakness (generalized)  Difficulty walking  ONSET DATE: March  2023  SUBJECTIVE:                                                                                                                                                                                           SUBJECTIVE STATEMENT:  Pt reports she is going to make appt with Dr. Ninfa Linden to get second opinion about hip replacement.  She has a R hip injection scheduled for 11/9.  She's made an effort to sleep on her back instead of her side.     PERTINENT HISTORY:  wrist fx, pleuritic chest pain, osteoporosis, migraine, hypothyroidism, glaucoma, fibromyalgia, R RTC repair, anterior lateral lumbar fusion L2-12 Nov 2019   PAIN:  Are you having pain? Yes: NPRS scale: 3/10 Pain location: R  hip Pain description: tightness, ache Aggravating factors: bending, lifting, stairs, sleeping, sitting, standing too long,  Relieving factors: ice, stretching, pain pill   PRECAUTIONS: None  WEIGHT BEARING RESTRICTIONS No  FALLS:  Has patient fallen in last 6 months? No  LIVING ENVIRONMENT: Lives with: lives with their family and lives with their spouse Lives in: House/apartment Stairs: Yes 2 story home Has following equipment at home: None  OCCUPATION: retired  PLOF: Independent with basic ADLs  PATIENT GOALS : Pt states she would like to be able to reduce constant pain   OBJECTIVE:   DIAGNOSTIC FINDINGS:  IMPRESSION: 1. Interval L2-3 and L3-4 PLIF with probable solid arthrodesis. No recurrent impingement. There has been progression of degenerative disease at the remaining open lumbar levels. Mild discogenic edema at L1-2 and L5-S1. 2. L1-2 high-grade spinal stenosis and moderate left foraminal narrowing. 3. L4-5 high-grade spinal stenosis. 4. L5-S1 right more than left subarticular recess narrowing. Moderate bilateral foraminal narrowing.  PATIENT SURVEYS:  FOTO 45 49 at [place] 7/14 40 FOTO 8/15 40 FOTO 10/5  LUMBAR ROM:   Active  A/PROM  08/19/2021 AROM 7/14 10/5   Flexion 75% 75% 75%  Extension 25% p! 50% p! 50%  Right lateral flexion 50%p! 75% p! 75%  Left lateral flexion 50% 50% p! 75%  Right rotation 50% 75% 75%  Left rotation 50% 75% 75%   (Blank rows = not tested)  LE MMT:  myotomal weakness of R LE noted at hip, knee, and ankle eversion, 4/5 quickly fatiguing  MMT Right 08/19/2021 Left 08/19/2021 R  7/14 L 7/14 Right / Left 8/15 Right / Left 10/5  Hip flexion myotomal weakness 4+/5 4/5 fatiguing 4+/5 3+/5 - 4/5 3+/5 - 4/5  Hip abduction myotomal weakness 4+/5 4/5 fatiguing 4+/5 4/5 - 4/5 4/5 - 4/5  Hip adduction myotomal weakness 4+/5 4/5 fatiguing 4+/5 4+/5 - 4+/5 4+/5 - 4+/5  Knee flexion myotomal weakness 4+/5 4/5 fatiguing 4+/5 4/5 - 4/5 4/5 - 4/5  Knee extension myotomal weakness 4+/5 4/5 fatiguing 4+/5 4+/5 - 4+/5 4+/5 - 4+/5  Ankle dorsiflexion 4+/5 4+/5 4/5 fatiguing 4+/5 4+/5 - 4+/5 4+/5 - 4+/5  Ankle eversion myotomal weakness 4+/5 4/5 fatiguing 4+/5 4+/5  4+/5    (Blank rows = not tested)  FUNCTIONAL TESTS:  5 times sit to stand: 19.1s  eval 7/14 5XSTS 13.1s 8/15 5xSTS 14.6 10/5 5XSTS: 13.2s    TODAY'S TREATMENT  Pt seen for aquatic therapy today.  Treatment took place in water 3.25-4 ft in depth at the [facility] pool. Temp of water was 93.  Pt entered/exited the pool via stairs (step to) independently with bilat rail.   Warm up: forward/ backward walking (painful in glute);  sidestepping Holding wall:  hip openers; 3 way leg kick (painful in R abdct) ; stork stance with clam; heel raises x 10; squats x 10 Straddling yellow noodle, holding yellow hand buoys: cycling and add/abd; ski - 3 rounds  High knee marching forward/backward with reciprocal arm swing LB, hamstring, fig 4, and gastroc stretch 2nd step holding to handrails Return to riding yellow noodle cycling     Pt requires buoyancy for support and to offload joints with strengthening exercises. Viscosity of the water is needed for resistance of  strengthening; water current perturbations provides challenge to standing balance unsupported, requiring increased core activation.  PATIENT EDUCATION:  Education details:  exercise progression Person educated: Patient Education method: Explanation, Demonstration, Tactile cues, Verbal cues, and Handouts Education comprehension: verbalized understanding, returned demonstration, verbal cues required, and tactile cues required   HOME EXERCISE PROGRAM: Access Code: [email or web address] URL: [email or web address] Date: 08/19/2021 Prepared by: Daleen Bo   ASSESSMENT:  CLINICAL IMPRESSION:  Pt reported increased pain in Rt lateral/posterior hip with backwards walking and standing hip abdct.  With legs suspended in deeper water, pt reported greater ease with hip abdct.  Pain eliminated with suspended cycling.  Plan to continue with R hip and hip strength to tolerance.   Pt would benefit from continued skilled therapy in order to reach goals and maximize functional R hip strength and ROM for prevention of further functional decline.    OBJECTIVE IMPAIRMENTS Abnormal gait, decreased activity tolerance, decreased balance, decreased endurance, decreased knowledge of condition, decreased mobility, difficulty walking, decreased ROM, decreased strength, hypomobility, increased muscle spasms, impaired flexibility, improper body mechanics, postural dysfunction, and pain.   ACTIVITY LIMITATIONS cleaning, community activity, driving, occupation, laundry, yard work, shopping, and exercise .   PERSONAL FACTORS Age, Behavior pattern, Fitness, Past/current experiences, Time since onset of injury/illness/exacerbation, and 1-2 comorbidities:  are also affecting patient's functional outcome.    REHAB POTENTIAL: Fair    CLINICAL DECISION MAKING: Evolving/moderate complexity  EVALUATION COMPLEXITY: Moderate   GOALS:   SHORT TERM GOALS: Target date: 09/30/2021    Pt will become independent with HEP  in order to demonstrate synthesis of PT education.  Baseline: Goal status: MET  2.  Pt will be able to demonstrate STS with full hip extension in order to demonstrate functional improvement in R hip strength and ROM for self-care and house hold duties.  Baseline:  Goal status: MET  3.  Pt will score at least 5 pt increase on FOTO to demonstrate functional improvement in MCII and pt perceived function.   Baseline:  Goal status: ongoing   LONG TERM GOALS: Target date: 01/17/2022   Pt will become independent with final HEP in order to demonstrate synthesis of PT education.  Baseline:  Goal status: ongoing  2.  Pt will be able to perform 5XSTS in under 12 in order to demonstrate functional improvement above the cut off score for older adults.  Baseline:  Goal status: ongoing  3.  Pt will be able to demonstrate ability to perform step to pattern on stairs without UE and pain in order to demonstrate functional improvement in L/S and LE function for self-care and house hold duties.  Baseline:  Goal status: ongoing  4.  Pt will score >/= 49 on FOTO to demonstrate improvement in perceived L/S function.  Baseline:  Goal status: ongoing    PLAN: PT FREQUENCY: 1-2x/week  PT DURATION: 8 weeks  PLANNED INTERVENTIONS: Therapeutic exercises, Therapeutic activity, Neuromuscular re-education, Balance training, Gait training, Patient/Family education, Joint manipulation, Joint mobilization, Stair training, Orthotic/Fit training, Aquatic Therapy, Dry Needling, Electrical stimulation, Spinal manipulation, Spinal mobilization, Cryotherapy, Moist heat, Compression bandaging, scar mobilization, Taping, Vasopneumatic device, Traction, Ultrasound, Ionotophoresis 32m/ml Dexamethasone, and Manual therapy.  PLAN FOR NEXT SESSION:  progress [facility]P [facility] and [facility] mobility, strength, general conditioning. Add 3 way leg kick to HEP, if continues to  tolerate.  JKerin Perna PTA 01/16/22 12:12 PM [facility] [address]GB[address] Castle N[place] [address]Phone: [phone]  Fax:  [phone]

## 2022-01-18 ENCOUNTER — Other Ambulatory Visit (HOSPITAL_BASED_OUTPATIENT_CLINIC_OR_DEPARTMENT_OTHER): Payer: Self-pay | Admitting: Internal Medicine

## 2022-01-18 DIAGNOSIS — Z1231 Encounter for screening mammogram for malignant neoplasm of breast: Secondary | ICD-10-CM

## 2022-01-19 DIAGNOSIS — H18452 Nodular corneal degeneration, left eye: Secondary | ICD-10-CM | POA: Diagnosis not present

## 2022-01-19 DIAGNOSIS — H04129 Dry eye syndrome of unspecified lacrimal gland: Secondary | ICD-10-CM | POA: Diagnosis not present

## 2022-01-19 DIAGNOSIS — H02889 Meibomian gland dysfunction of unspecified eye, unspecified eyelid: Secondary | ICD-10-CM | POA: Diagnosis not present

## 2022-01-19 NOTE — Therapy (Signed)
OUTPATIENT PHYSICAL THERAPY PROGRESS NOTE           Patient Name: [name] MRN: [id number] DOB:Aug 17, 1945, 76 y.o., female Today's Date: 01/20/2022   PT End of Session - 01/20/22 1200     Visit Number 15    Number of Visits 21    Date for PT Re-Evaluation 04/12/22    Authorization Type [employer] [facility]    Authorization Time Period 01/12/22-04/12/22    Progress Note Due on Visit 19    PT Start Time 1201    PT Stop Time 1245    PT Time Calculation (min) 44 min    Activity Tolerance Patient tolerated treatment well    Behavior During Therapy WFL for tasks assessed/performed                Past Medical History:  Diagnosis Date   Arthritis    Back pain    Back pain    Cataract    both eyes rt. 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   on doxy   Osteoporosis    osteopenia   Pleuritic chest pain    etiology unclear, has beeb eval by pulmonary and rheumatology (autoimmune dz?)   Swelling of lower extremity    Wrist fracture 2018   Past Surgical History:  Procedure Laterality Date   ANTERIOR LAT LUMBAR FUSION N/A 12/03/2019   Procedure: ANTERIOR LATERAL LUMBAR FUSION L2-4;  Surgeon: Melina Schools, MD;  Location: [place];  Service: Orthopedics;  Laterality: N/A;  4 hrs   COLONOSCOPY     COSMETIC SURGERY  04/12/2018   L upper eyelid ptosis repair, and trichophytic brow lift   ESOPHAGOGASTRODUODENOSCOPY (EGD) WITH PROPOFOL N/A 03/27/2013   Procedure: ESOPHAGOGASTRODUODENOSCOPY (EGD) WITH PROPOFOL;  Surgeon: Milus Banister, MD;  Location[facility]  ENDOSCOPY;  Service: Endoscopy;  Laterality: N/A;  possible dil   EYE SURGERY     growth on cornea-bil.   plastic surgery face  2013   POLYPECTOMY     right rotator cuff repair  2010   TONSILLECTOMY     UPPER GASTROINTESTINAL ENDOSCOPY     Patient Active Problem List   Diagnosis Date Noted   Primary osteoarthritis of right hip 12/13/2021   Spondylosis without myelopathy or radiculopathy, lumbar region 12/13/2021   Prediabetes 11/24/2021   Other fatigue 11/23/2021   SOB (shortness of breath) on exertion 11/23/2021   Hyperlipidemia 11/23/2021   Hyperglycemia 11/23/2021   Vitamin D deficiency 11/23/2021   Vitamin B 12 deficiency 11/23/2021   Class 1 obesity with serious comorbidity and body mass index (BMI) of 30.0 to 30.9 in adult 11/23/2021   Fusion of lumbar spine 12/03/2019   COPD (chronic obstructive pulmonary disease) ([place]) 08/22/2019   Spinal stenosis of lumbar region with neurogenic claudication 11/22/2018   Coronary artery disease due to lipid rich plaque 10/03/2018   Closed fracture of distal end of radius 04/16/2017   Chronic back  pain 03/15/2017   PCP NOTES >>>>>>>>>>>>>>>>>>>> 05/24/2015   Numbness and tingling of right arm 05/22/2015   Insomnia 09/01/2014   GERD (gastroesophageal reflux disease) 03/27/2013   Dysphagia, pharyngoesophageal phase 03/27/2013   Annual physical exam 06/12/2011   Sinusitis, chronic 02/02/2011   Osteoporosis 06/08/2010   Fibromyalgia, pain mngmt 01/29/2009   LIPOMA 09/30/2008   Migraine without aura 03/04/2007   Hypothyroidism 05/15/2006   COLONIC POLYPS, HX OF 05/15/2006    PCP:  Kathlene November, MD  REFERRING PROVIDER:  Alysia Penna, MD  REFERRING DIAG:  [phone] (ICD-10-CM) - Spondylosis without myelopathy or radiculopathy, lumbar region  M96.1 (ICD-10-CM) - Lumbar post-laminectomy syndrome    THERAPY DIAG:  Other low back pain  Pain in right hip  Muscle weakness (generalized)  Difficulty walking  ONSET DATE: March  2023  SUBJECTIVE:                                                                                                                                                                                           SUBJECTIVE STATEMENT: Back is better, no pain today slight hip right pain 2/10.  I have lost 15 LB. See Dr Ninfa Linden soon for assessment of right hip (THR??)  Pt reports she is going to make appt with Dr. Ninfa Linden to get second opinion about hip replacement.  She has a R hip injection scheduled for 11/9.  She's made an effort to sleep on her back instead of her side.     PERTINENT HISTORY:  wrist fx, pleuritic chest pain, osteoporosis, migraine, hypothyroidism, glaucoma, fibromyalgia, R RTC repair, anterior lateral lumbar fusion L2-12 Nov 2019   PAIN:  Are you having pain? Yes: NPRS scale: 3/10 Pain location: R  hip Pain description: tightness, ache Aggravating factors: bending, lifting, stairs, sleeping, sitting, standing too long,  Relieving factors: ice, stretching, pain pill   PRECAUTIONS: None  WEIGHT BEARING RESTRICTIONS No  FALLS:  Has patient fallen in last 6 months? No  LIVING ENVIRONMENT: Lives with: lives with their family and lives with their spouse Lives in: House/apartment Stairs: Yes 2 story home Has following equipment at home: None  OCCUPATION: retired  PLOF: Independent with basic ADLs  PATIENT GOALS : Pt states she would like to be able to reduce constant pain   OBJECTIVE:   DIAGNOSTIC FINDINGS:  IMPRESSION: 1. Interval L2-3 and L3-4 PLIF with probable solid arthrodesis. No recurrent impingement. There has been progression of degenerative disease at the remaining open lumbar levels. Mild discogenic edema at L1-2 and L5-S1. 2. L1-2 high-grade spinal stenosis and moderate left foraminal narrowing. 3. L4-5 high-grade spinal stenosis. 4. L5-S1 right more than  left subarticular recess narrowing. Moderate bilateral foraminal narrowing.  PATIENT  SURVEYS:  FOTO 45 49 at [place]  44 FOTO 7/14 40 FOTO 8/15 40 FOTO 10/5  LUMBAR ROM:   Active  A/PROM  08/19/2021 AROM 7/14 10/5  Flexion 75% 75% 75%  Extension 25% p! 50% p! 50%  Right lateral flexion 50%p! 75% p! 75%  Left lateral flexion 50% 50% p! 75%  Right rotation 50% 75% 75%  Left rotation 50% 75% 75%   (Blank rows = not tested)  LE MMT:  myotomal weakness of R LE noted at hip, knee, and ankle eversion, 4/5 quickly fatiguing  MMT Right 08/19/2021 Left 08/19/2021 R  7/14 L 7/14 Right / Left 8/15 Right / Left 10/5  Hip flexion myotomal weakness 4+/5 4/5 fatiguing 4+/5 3+/5 - 4/5 3+/5 - 4/5  Hip abduction myotomal weakness 4+/5 4/5 fatiguing 4+/5 4/5 - 4/5 4/5 - 4/5  Hip adduction myotomal weakness 4+/5 4/5 fatiguing 4+/5 4+/5 - 4+/5 4+/5 - 4+/5  Knee flexion myotomal weakness 4+/5 4/5 fatiguing 4+/5 4/5 - 4/5 4/5 - 4/5  Knee extension myotomal weakness 4+/5 4/5 fatiguing 4+/5 4+/5 - 4+/5 4+/5 - 4+/5  Ankle dorsiflexion 4+/5 4+/5 4/5 fatiguing 4+/5 4+/5 - 4+/5 4+/5 - 4+/5  Ankle eversion myotomal weakness 4+/5 4/5 fatiguing 4+/5 4+/5  4+/5    (Blank rows = not tested)  FUNCTIONAL TESTS:  5 times sit to stand: 19.1s  eval 7/14 5XSTS 13.1s 8/15 5xSTS 14.6 10/5 5XSTS: 13.2s    TODAY'S TREATMENT  Pt seen for aquatic therapy today.  Treatment took place in water 3.25-4 ft in depth at the [facility] pool. Temp of water was 93.  Pt entered/exited the pool via stairs (step to) independently with bilat rail.   Warm up: forward/ backward walking (painful in glute);  sidestepping Holding wall:  hip openers; 3 way leg kick (painful in R abdct) ; stork stance with clam; heel raises x 10; squats x 10 Straddling yellow noodle, holding yellow hand buoys: cycling and add/abd; ski - 3 rounds  High knee marching forward/backward with reciprocal arm swing Hamstring, fig 4, and gastroc stretch 2nd step holding to handrails L stretch hands on wall Standing leaning against  wall IT band stretch Straddling yellow noodle cycling; add/abd [employer] row le staggered R/L leading 2x15    Pt requires buoyancy for support and to offload joints with strengthening exercises. Viscosity of the water is needed for resistance of strengthening; water current perturbations provides challenge to standing balance unsupported, requiring increased core activation.  PATIENT EDUCATION:  Education details:  exercise progression Person educated: Patient Education method: Explanation, Demonstration, Tactile cues, Verbal cues, and Handouts Education comprehension: verbalized understanding, returned demonstration, verbal cues required, and tactile cues required   HOME EXERCISE PROGRAM: Access Code: [email or web address] URL: [email or web address] Date: 08/19/2021 Prepared by: Daleen Bo   ASSESSMENT:  CLINICAL IMPRESSION: Pt without LBP today, slight right hip pain 2/10. Stretched bilat IT bands both tight. Progressed core strengthening with some irritation to left LB buttock area which subsided with water walking warm down.  Discussed potential return to land based therapy going forward after upcoming appointments with MD's.  She is progressing well towards goals.   Pt would benefit from continued skilled therapy in order to reach goals and maximize functional R hip strength and ROM for prevention of further functional decline.    OBJECTIVE IMPAIRMENTS Abnormal gait, decreased activity tolerance, decreased balance, decreased endurance, decreased knowledge of condition, decreased mobility, difficulty walking, decreased ROM,  decreased strength, hypomobility, increased muscle spasms, impaired flexibility, improper body mechanics, postural dysfunction, and pain.   ACTIVITY LIMITATIONS cleaning, community activity, driving, occupation, laundry, yard work, shopping, and exercise .   PERSONAL FACTORS Age, Behavior pattern, Fitness, Past/current experiences, Time since onset of  injury/illness/exacerbation, and 1-2 comorbidities:    are also affecting patient's functional outcome.    REHAB POTENTIAL: Fair    CLINICAL DECISION MAKING: Evolving/moderate complexity  EVALUATION COMPLEXITY: Moderate   GOALS:   SHORT TERM GOALS: Target date: 09/30/2021    Pt will become independent with HEP in order to demonstrate synthesis of PT education.  Baseline: Goal status: MET  2.  Pt will be able to demonstrate STS with full hip extension in order to demonstrate functional improvement in R hip strength and ROM for self-care and house hold duties.  Baseline:  Goal status: MET  3.  Pt will score at least 5 pt increase on FOTO to demonstrate functional improvement in MCII and pt perceived function.   Baseline:  Goal status: ongoing   LONG TERM GOALS: Target date: 04/12/2021   Pt will become independent with final HEP in order to demonstrate synthesis of PT education.  Baseline:  Goal status: ongoing  2.  Pt will be able to perform 5XSTS in under 12 in order to demonstrate functional improvement above the cut off score for older adults.  Baseline:  Goal status: ongoing  3.  Pt will be able to demonstrate ability to perform step to pattern on stairs without UE and pain in order to demonstrate functional improvement in L/S and LE function for self-care and house hold duties.  Baseline:  Goal status: ongoing  4.  Pt will score >/= 49 on FOTO to demonstrate improvement in perceived L/S function.  Baseline:  Goal status: ongoing    PLAN: PT FREQUENCY: 1-2x/week  PT DURATION: 8 weeks  PLANNED INTERVENTIONS: Therapeutic exercises, Therapeutic activity, Neuromuscular re-education, Balance training, Gait training, Patient/Family education, Joint manipulation, Joint mobilization, Stair training, Orthotic/Fit training, Aquatic Therapy, Dry Needling, Electrical stimulation, Spinal manipulation, Spinal mobilization, Cryotherapy, Moist heat, Compression bandaging, scar  mobilization, Taping, Vasopneumatic device, Traction, Ultrasound, Ionotophoresis 29m/ml Dexamethasone, and Manual therapy.  PLAN FOR NEXT SESSION:  progress aquatic HEP [facility] and R hip mobility, strength, general conditioning. Add 3 way leg kick to HEP, if continues to tolerate.  MStanton Kidney([place] Saadiq Poche MPT 01/20/22 12:03 PM [facility] [address] [address]Phone: [phone]  Fax:  [phone]

## 2022-01-20 ENCOUNTER — Ambulatory Visit (HOSPITAL_BASED_OUTPATIENT_CLINIC_OR_DEPARTMENT_OTHER): Payer: Medicare HMO | Admitting: Physical Therapy

## 2022-01-20 ENCOUNTER — Encounter (HOSPITAL_BASED_OUTPATIENT_CLINIC_OR_DEPARTMENT_OTHER): Payer: Self-pay | Admitting: Physical Therapy

## 2022-01-20 DIAGNOSIS — R262 Difficulty in walking, not elsewhere classified: Secondary | ICD-10-CM

## 2022-01-20 DIAGNOSIS — M25551 Pain in right hip: Secondary | ICD-10-CM | POA: Diagnosis not present

## 2022-01-20 DIAGNOSIS — M6281 Muscle weakness (generalized): Secondary | ICD-10-CM | POA: Diagnosis not present

## 2022-01-20 DIAGNOSIS — M5459 Other low back pain: Secondary | ICD-10-CM

## 2022-01-21 ENCOUNTER — Other Ambulatory Visit: Payer: Self-pay | Admitting: Cardiovascular Disease

## 2022-01-23 ENCOUNTER — Encounter: Payer: Self-pay | Admitting: Bariatrics

## 2022-01-23 NOTE — Progress Notes (Signed)
Chief Complaint:   OBESITY [name] is here to discuss her progress with her obesity treatment plan along with follow-up of her obesity related diagnoses. [name] is on the Category 1 Plan and states she is following her eating plan approximately 90% of the time. [name] states she is doing pool therapy for 45 minutes 2 times per week.  Today's visit was #: 3 Starting weight: 184 lbs Starting date: 11/23/2021 Today's weight: 170 lbs Today's date: 01/16/2022 Total lbs lost to date: 14 Total lbs lost since last in-office visit: 8  Interim History: [name] is down 8 lbs since her last visit. She hates vegetables and she misses potatoes. She wants to talk about sweets.   Subjective:   1. Other hyperlipidemia [name] is taking Pravachol.   2. Prediabetes [name] has been working on decreasing carbohydrates.   Assessment/Plan:   1. Other hyperlipidemia [name] will continue her medications. She will increase PUFA's and MUFA's.   2. Prediabetes [name] will continue to minimize sweets and starches.   3. Obesity, current BMI 28.4 [name] is currently in the action stage of change. As such, her goal is to continue with weight loss efforts. She has agreed to the Category 1 Plan.   Meal planning and intentional eating were discussed. Keep protein high. Keto cookies were discussed. Recipes handout was given.   Exercise goals: As is.   Behavioral modification strategies: increasing lean protein intake, decreasing simple carbohydrates, increasing vegetables, increasing water intake, decreasing eating out, no skipping meals, meal planning and cooking strategies, keeping healthy foods in the home, and planning for success.  [name] has agreed to follow-up with our clinic in 2 weeks. She was informed of the importance of frequent follow-up visits to maximize her success with intensive lifestyle modifications for her multiple health conditions.   Objective:   Blood pressure 108/69,  pulse 69, temperature 98 F (36.7 C), height '5\' 5"'$  (1.651 m), weight 170 lb (77.1 kg), last menstrual period 04/10/1998, SpO2 96 %. Body mass index is 28.29 kg/m.  General: Cooperative, alert, well developed, in no acute distress. HEENT: Conjunctivae and lids unremarkable. Cardiovascular: Regular rhythm.  Lungs: Normal work of breathing. Neurologic: No focal deficits.   Lab Results  Component Value Date   CREATININE 0.82 11/10/2021   BUN 14 11/10/2021   NA 141 11/10/2021   K 3.5 11/10/2021   CL 101 11/10/2021   CO2 32 11/10/2021   Lab Results  Component Value Date   ALT 11 07/26/2021   AST 14 07/26/2021   ALKPHOS 78 10/18/2020   BILITOT 0.6 10/18/2020   Lab Results  Component Value Date   HGBA1C 5.7 (H) 11/23/2021   HGBA1C 5.8 03/24/2021   HGBA1C 5.5 05/22/2015   Lab Results  Component Value Date   INSULIN 8.7 11/23/2021   Lab Results  Component Value Date   TSH 1.97 11/10/2021   Lab Results  Component Value Date   CHOL 154 07/26/2021   HDL 54.30 07/26/2021   LDLCALC 71 07/26/2021   LDLDIRECT 92.0 03/04/2007   TRIG 143.0 07/26/2021   CHOLHDL 3 07/26/2021   Lab Results  Component Value Date   VD25OH 47.1 11/23/2021   VD25OH 44.87 10/21/2021   VD25OH 46.9 10/05/2020   Lab Results  Component Value Date   WBC 6.8 11/10/2021   HGB 13.7 11/10/2021   HCT 40.7 11/10/2021   MCV 91.0 11/10/2021   PLT 281.0 11/10/2021   No results found for: "IRON", "TIBC", "FERRITIN"  Attestation Statements:  Reviewed by clinician on day of visit: allergies, medications, problem list, medical history, surgical history, family history, social history, and previous encounter notes.   [name], am acting as Location manager for [employer], DO.  I have reviewed the above documentation for accuracy and completeness, and I agree with the above. Michelle Lesch, DO

## 2022-01-24 ENCOUNTER — Ambulatory Visit (HOSPITAL_BASED_OUTPATIENT_CLINIC_OR_DEPARTMENT_OTHER): Payer: Medicare HMO | Admitting: Physical Therapy

## 2022-01-24 DIAGNOSIS — M6281 Muscle weakness (generalized): Secondary | ICD-10-CM | POA: Diagnosis not present

## 2022-01-24 DIAGNOSIS — M25551 Pain in right hip: Secondary | ICD-10-CM | POA: Diagnosis not present

## 2022-01-24 DIAGNOSIS — R262 Difficulty in walking, not elsewhere classified: Secondary | ICD-10-CM | POA: Diagnosis not present

## 2022-01-24 DIAGNOSIS — M5459 Other low back pain: Secondary | ICD-10-CM

## 2022-01-24 NOTE — Therapy (Signed)
OUTPATIENT PHYSICAL THERAPY PROGRESS NOTE           Patient Name: [name] MRN: [id number] DOB:04/13/1945, 76 y.o., female Today's Date: 01/24/2022   PT End of Session - 01/24/22 1126     Visit Number 16    Number of Visits 21    Date for PT Re-Evaluation 04/12/22    Authorization Type [place]    Authorization Time Period 01/12/22-04/12/22    Progress Note Due on Visit 19    PT Start Time 1119   pt arrived late   PT Stop Time 1200    PT Time Calculation (min) 41 min    Activity Tolerance Patient tolerated treatment well    Behavior During Therapy WFL for tasks assessed/performed                Past Medical History:  Diagnosis Date   Arthritis    Back pain    Back pain    Cataract    both eyes rt. 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   Fibromyalgia    see rheumatology   GERD (gastroesophageal reflux disease)    PAST HX    High blood pressure    High cholesterol    Hyperlipidemia    Hypothyroidism    Joint pain    Migraine    Ocular rosacea 03/2009   on doxy   Osteoporosis    osteopenia   Pleuritic chest pain    etiology unclear, has beeb eval by pulmonary and rheumatology (autoimmune dz?)   Swelling of lower extremity    Wrist fracture 2018   Past Surgical History:  Procedure Laterality Date   ANTERIOR LAT LUMBAR FUSION N/A 12/03/2019   Procedure: ANTERIOR LATERAL LUMBAR FUSION L2-4;  Surgeon: Melina Schools, MD;  Location: [place];  Service: Orthopedics;  Laterality: N/A;  4 hrs   COLONOSCOPY     COSMETIC SURGERY  04/12/2018   L upper eyelid ptosis repair, and trichophytic brow lift   ESOPHAGOGASTRODUODENOSCOPY (EGD) WITH PROPOFOL N/A 03/27/2013   Procedure: ESOPHAGOGASTRODUODENOSCOPY (EGD) WITH PROPOFOL;  Surgeon: Milus Banister,  MD;  Location: [facility];  Service: Endoscopy;  Laterality: N/A;  possible dil   EYE SURGERY     growth on cornea-bil.   plastic surgery face  2013   POLYPECTOMY     right rotator cuff repair  2010   TONSILLECTOMY     UPPER GASTROINTESTINAL ENDOSCOPY     Patient Active Problem List   Diagnosis Date Noted   Primary osteoarthritis of right hip 12/13/2021   Spondylosis without myelopathy or radiculopathy, lumbar region 12/13/2021   Prediabetes 11/24/2021   Other fatigue 11/23/2021   SOB (shortness of breath) on exertion 11/23/2021   Hyperlipidemia 11/23/2021   Hyperglycemia 11/23/2021   Vitamin D deficiency 11/23/2021   Vitamin B 12 deficiency 11/23/2021   Class 1 obesity with serious comorbidity and body mass index (BMI) of 30.0 to 30.9 in adult 11/23/2021   Fusion of lumbar spine 12/03/2019   COPD (chronic obstructive pulmonary disease) ([place]) 08/22/2019   Spinal stenosis of lumbar region with neurogenic claudication 11/22/2018   Coronary artery disease due to lipid rich plaque 10/03/2018   Closed fracture of distal end of radius 04/16/2017  Chronic back pain 03/15/2017   PCP NOTES >>>>>>>>>>>>>>>>>>>> 05/24/2015   Numbness and tingling of right arm 05/22/2015   Insomnia 09/01/2014   GERD (gastroesophageal reflux disease) 03/27/2013   Dysphagia, pharyngoesophageal phase 03/27/2013   Annual physical exam 06/12/2011   Sinusitis, chronic 02/02/2011   Osteoporosis 06/08/2010   Fibromyalgia, pain mngmt 01/29/2009   LIPOMA 09/30/2008   Migraine without aura 03/04/2007   Hypothyroidism 05/15/2006   COLONIC POLYPS, HX OF 05/15/2006    PCP:  Kathlene November, MD  REFERRING PROVIDER:  Alysia Penna, MD  REFERRING DIAG:  [phone] (ICD-10-CM) - Spondylosis without myelopathy or radiculopathy, lumbar region  M96.1 (ICD-10-CM) - Lumbar post-laminectomy syndrome    THERAPY DIAG:  Other low back pain  Pain in right hip  Muscle weakness (generalized)  ONSET DATE: March  2023  SUBJECTIVE:                                                                                                                                                                                           SUBJECTIVE STATEMENT: "I'm not feeling so good"  Pt reports she has a new Sleep Number bed and it didn't provide enough support last night.  She sees Dr Ninfa Linden on 10/23.   PERTINENT HISTORY:  wrist fx, pleuritic chest pain, osteoporosis, migraine, hypothyroidism, glaucoma, fibromyalgia, R RTC repair, anterior lateral lumbar fusion L2-12 Nov 2019   PAIN:  Are you having pain? Yes: NPRS scale: 4/10 Pain location: back and R  hip Pain description: tightness, ache Aggravating factors: bending, lifting, stairs, sleeping, sitting, standing too long,  Relieving factors: ice, stretching, pain pill   PRECAUTIONS: None  WEIGHT BEARING RESTRICTIONS No  FALLS:  Has patient fallen in last 6 months? No  LIVING ENVIRONMENT: Lives with: lives with their family and lives with their spouse Lives in: House/apartment Stairs: Yes 2 story home Has following equipment at home: None  OCCUPATION: retired  PLOF: Independent with basic ADLs  PATIENT GOALS : Pt states she would like to be able to reduce constant pain   OBJECTIVE:   DIAGNOSTIC FINDINGS:  IMPRESSION: 1. Interval L2-3 and L3-4 PLIF with probable solid arthrodesis. No recurrent impingement. There has been progression of degenerative disease at the remaining open lumbar levels. Mild discogenic edema at L1-2 and L5-S1. 2. L1-2 high-grade spinal stenosis and moderate left foraminal narrowing. 3. L4-5 high-grade spinal stenosis. 4. L5-S1 right more than left subarticular recess narrowing. Moderate bilateral foraminal narrowing.  PATIENT SURVEYS:  FOTO 45 49 at DC  44 FOTO 7/14 40 FOTO 8/15 40 FOTO 10/5  LUMBAR ROM:   Active  A/PROM  08/19/2021 AROM 7/14 10/5  Flexion 75% 75% 75%  Extension 25% p! 50% p! 50%  Right  lateral flexion 50%p! 75% p! 75%  Left lateral flexion 50% 50% p! 75%  Right rotation 50% 75% 75%  Left rotation 50% 75% 75%   (Blank rows = not tested)  LE MMT:  myotomal weakness of R LE noted at hip, knee, and ankle eversion, 4/5 quickly fatiguing  MMT Right 08/19/2021 Left 08/19/2021 R  7/14 L 7/14 Right / Left 8/15 Right / Left 10/5  Hip flexion myotomal weakness 4+/5 4/5 fatiguing 4+/5 3+/5 - 4/5 3+/5 - 4/5  Hip abduction myotomal weakness 4+/5 4/5 fatiguing 4+/5 4/5 - 4/5 4/5 - 4/5  Hip adduction myotomal weakness 4+/5 4/5 fatiguing 4+/5 4+/5 - 4+/5 4+/5 - 4+/5  Knee flexion myotomal weakness 4+/5 4/5 fatiguing 4+/5 4/5 - 4/5 4/5 - 4/5  Knee extension myotomal weakness 4+/5 4/5 fatiguing 4+/5 4+/5 - 4+/5 4+/5 - 4+/5  Ankle dorsiflexion 4+/5 4+/5 4/5 fatiguing 4+/5 4+/5 - 4+/5 4+/5 - 4+/5  Ankle eversion myotomal weakness 4+/5 4/5 fatiguing 4+/5 4+/5  4+/5    (Blank rows = not tested)  FUNCTIONAL TESTS:  5 times sit to stand: 19.1s  eval 7/14 5XSTS 13.1s 8/15 5xSTS 14.6 10/5 5XSTS: 13.2s    TODAY'S TREATMENT  Pt seen for aquatic therapy today.  Treatment took place in water 3.25-4 ft in depth at the [facility] pool. Temp of water was 93.  Pt entered/exited the pool via stairs (step to) independently with bilat rail.   - Warm up: forward/ backward walking;  sidestepping  (painful in R lateral hip moving to R) - Holding wall:  hip openers (wide march);  heel raises x 10; squats x 10; Leg swings front to/from back x 10 each; single LE clams - High knee marching forward/backward with reciprocal arm swing - Kick board row le staggered R/L leading x 15 - Straddling yellow noodle, breast stroke arms/holding yellow hand buoys : cycling and add/abd; ski - 3 rounds  - 3 way leg stretch with thin square noodle, back against wall RLE x 2 reps, LLE x 1 rep   Pt requires buoyancy for support and to offload joints with strengthening exercises. Viscosity of the water is  needed for resistance of strengthening; water current perturbations provides challenge to standing balance unsupported, requiring increased core activation.  PATIENT EDUCATION:  Education details:  exercise progression Person educated: Patient Education method: Explanation, Demonstration, Tactile cues, Verbal cues Education comprehension: verbalized understanding, returned demonstration, verbal cues required, and tactile cues required   HOME EXERCISE PROGRAM: Access Code: [email or web address] URL: [email or web address] Date: 08/19/2021 Prepared by: Daleen Bo   ASSESSMENT:  CLINICAL IMPRESSION: Continued R hip pain with hip abdct motions in pool. Rt ant hip appears tight as well. She reported reduction of tightness in Rt hip at end of session; pain level only slightly reduced.   She is progressing towards goals.  Pt would benefit from continued skilled therapy in order to reach goals and maximize functional R hip strength and ROM for prevention of further functional decline.    OBJECTIVE IMPAIRMENTS Abnormal gait, decreased activity tolerance, decreased balance, decreased endurance, decreased knowledge of condition, decreased mobility, difficulty walking, decreased ROM, decreased strength, hypomobility, increased muscle spasms, impaired flexibility, improper body mechanics, postural dysfunction, and pain.   ACTIVITY LIMITATIONS cleaning, community activity, driving, occupation, laundry, yard work, shopping, and exercise .   PERSONAL FACTORS Age, Behavior pattern, Fitness, Past/current experiences, Time since onset of injury/illness/exacerbation, and 1-2 comorbidities:  are also affecting patient's functional outcome.    REHAB POTENTIAL: Fair    CLINICAL DECISION MAKING: Evolving/moderate complexity  EVALUATION COMPLEXITY: Moderate   GOALS:   SHORT TERM GOALS: Target date: 09/30/2021    Pt will become independent with HEP in order to demonstrate synthesis of PT education.   Baseline: Goal status: MET  2.  Pt will be able to demonstrate STS with full hip extension in order to demonstrate functional improvement in R hip strength and ROM for self-care and house hold duties.  Baseline:  Goal status: MET  3.  Pt will score at least 5 pt increase on FOTO to demonstrate functional improvement in MCII and pt perceived function.   Baseline:  Goal status: ongoing   LONG TERM GOALS: Target date: 04/12/2021   Pt will become independent with final HEP in order to demonstrate synthesis of PT education.  Baseline:  Goal status: ongoing  2.  Pt will be able to perform 5XSTS in under 12 in order to demonstrate functional improvement above the cut off score for older adults.  Baseline:  Goal status: ongoing  3.  Pt will be able to demonstrate ability to perform step to pattern on stairs without UE and pain in order to demonstrate functional improvement in L/S and LE function for self-care and house hold duties.  Baseline:  Goal status: ongoing  4.  Pt will score >/= 49 on FOTO to demonstrate improvement in perceived L/S function.  Baseline:  Goal status: ongoing    PLAN: PT FREQUENCY: 1-2x/week  PT DURATION: 8 weeks  PLANNED INTERVENTIONS: Therapeutic exercises, Therapeutic activity, Neuromuscular re-education, Balance training, Gait training, Patient/Family education, Joint manipulation, Joint mobilization, Stair training, Orthotic/Fit training, Aquatic Therapy, Dry Needling, Electrical stimulation, Spinal manipulation, Spinal mobilization, Cryotherapy, Moist heat, Compression bandaging, scar mobilization, Taping, Vasopneumatic device, Traction, Ultrasound, Ionotophoresis 29m/ml Dexamethasone, and Manual therapy.  PLAN FOR NEXT SESSION:  progress aquatic HEP [facility], strength, general conditioning. Add 3 way leg kick to HEP, if continues to tolerate.  JKerin Perna PTA 01/24/22 1:44 PM [facility] [address] [address]Phone: [phone]  Fax:  [phone]

## 2022-01-27 ENCOUNTER — Ambulatory Visit (HOSPITAL_BASED_OUTPATIENT_CLINIC_OR_DEPARTMENT_OTHER): Payer: Medicare HMO | Admitting: Physical Therapy

## 2022-01-27 ENCOUNTER — Encounter (HOSPITAL_BASED_OUTPATIENT_CLINIC_OR_DEPARTMENT_OTHER): Payer: Self-pay | Admitting: Physical Therapy

## 2022-01-27 DIAGNOSIS — M6281 Muscle weakness (generalized): Secondary | ICD-10-CM

## 2022-01-27 DIAGNOSIS — M5459 Other low back pain: Secondary | ICD-10-CM | POA: Diagnosis not present

## 2022-01-27 DIAGNOSIS — R262 Difficulty in walking, not elsewhere classified: Secondary | ICD-10-CM | POA: Diagnosis not present

## 2022-01-27 DIAGNOSIS — M25551 Pain in right hip: Secondary | ICD-10-CM | POA: Diagnosis not present

## 2022-01-27 NOTE — Therapy (Signed)
OUTPATIENT PHYSICAL THERAPY PROGRESS NOTE           Patient Name: [name] MRN: [id number] DOB:[birth year], 76 y.o., female Today's Date: 01/27/2022   PT End of Session - 01/27/22 1206     Visit Number 17    Number of Visits 21    Date for PT Re-Evaluation 04/12/22    Authorization Type [employer] MCR    Authorization Time Period 01/12/22-04/12/22    Progress Note Due on Visit 19    PT Start Time 1204    PT Stop Time 1245    PT Time Calculation (min) 41 min    Activity Tolerance Patient tolerated treatment well    Behavior During Therapy WFL for tasks assessed/performed                Past Medical History:  Diagnosis Date   Arthritis    Back pain    Back pain    Cataract    both eyes rt. 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   Fibromyalgia    see rheumatology   GERD (gastroesophageal reflux disease)    PAST HX    High blood pressure    High cholesterol    Hyperlipidemia    Hypothyroidism    Joint pain    Migraine    Ocular rosacea 03/2009   on doxy   Osteoporosis    osteopenia   Pleuritic chest pain    etiology unclear, has beeb eval by pulmonary and rheumatology (autoimmune dz?)   Swelling of lower extremity    Wrist fracture 2018   Past Surgical History:  Procedure Laterality Date   ANTERIOR LAT LUMBAR FUSION N/A 12/03/2019   Procedure: ANTERIOR LATERAL LUMBAR FUSION L2-4;  Surgeon: Melina Schools, MD;  Location: [facility];  Service: Orthopedics;  Laterality: N/A;  4 hrs   COLONOSCOPY     COSMETIC SURGERY  04/12/2018   L upper eyelid ptosis repair, and trichophytic brow lift   ESOPHAGOGASTRODUODENOSCOPY (EGD) WITH PROPOFOL N/A 03/27/2013   Procedure: ESOPHAGOGASTRODUODENOSCOPY (EGD) WITH PROPOFOL;  Surgeon: Milus Banister, MD;  Location: [facility];  Service: Endoscopy;  Laterality: N/A;  possible dil   EYE SURGERY     growth on cornea-bil.   plastic surgery face  2013   POLYPECTOMY     right rotator cuff repair  2010   TONSILLECTOMY     UPPER GASTROINTESTINAL ENDOSCOPY     Patient Active Problem List   Diagnosis Date Noted   Primary osteoarthritis of right hip 12/13/2021   Spondylosis without myelopathy or radiculopathy, lumbar region 12/13/2021   Prediabetes 11/24/2021   Other fatigue 11/23/2021   SOB (shortness of breath) on exertion 11/23/2021   Hyperlipidemia 11/23/2021   Hyperglycemia 11/23/2021   Vitamin D deficiency 11/23/2021   Vitamin B 12 deficiency 11/23/2021   Class 1 obesity with serious comorbidity and body mass index (BMI) of 30.0 to 30.9 in adult 11/23/2021   Fusion of lumbar spine 12/03/2019   COPD (chronic obstructive pulmonary disease) ([place]) 08/22/2019   Spinal stenosis of lumbar region with neurogenic claudication 11/22/2018   Coronary artery disease due to lipid rich plaque 10/03/2018   Closed fracture of distal end of radius 04/16/2017   Chronic back  pain 03/15/2017   PCP NOTES >>>>>>>>>>>>>>>>>>>> 05/24/2015   Numbness and tingling of right arm 05/22/2015   Insomnia 09/01/2014   GERD (gastroesophageal reflux disease) 03/27/2013   Dysphagia, pharyngoesophageal phase 03/27/2013   Annual physical exam 06/12/2011   Sinusitis, chronic 02/02/2011   Osteoporosis 06/08/2010   Fibromyalgia, pain mngmt 01/29/2009   LIPOMA 09/30/2008   Migraine without aura 03/04/2007   Hypothyroidism 05/15/2006   COLONIC POLYPS, HX OF 05/15/2006    PCP:  Kathlene November, MD  REFERRING PROVIDER:  Alysia Penna, MD  REFERRING DIAG:  [phone] (ICD-10-CM) - Spondylosis without myelopathy or radiculopathy, lumbar region  M96.1 (ICD-10-CM) - Lumbar post-laminectomy syndrome    THERAPY DIAG:  Other low back pain  Pain in right hip  Muscle weakness (generalized)  ONSET DATE: March 2023  SUBJECTIVE:                                                                                                                                                                                            SUBJECTIVE STATEMENT: "II have been increasing the "number" on my bed to sleep more comfortably She sees Dr Ninfa Linden on 10/23.   PERTINENT HISTORY:  wrist fx, pleuritic chest pain, osteoporosis, migraine, hypothyroidism, glaucoma, fibromyalgia, R RTC repair, anterior lateral lumbar fusion L2-12 Nov 2019   PAIN:  Are you having pain? Yes: NPRS scale: 5/10 Pain location: back and R  hip Pain description: tightness, ache Aggravating factors: bending, lifting, stairs, sleeping, sitting, standing too long,  Relieving factors: ice, stretching, pain pill   PRECAUTIONS: None  WEIGHT BEARING RESTRICTIONS No  FALLS:  Has patient fallen in last 6 months? No  LIVING ENVIRONMENT: Lives with: lives with their family and lives with their spouse Lives in: House/apartment Stairs: Yes 2 story home Has following equipment at home: None  OCCUPATION: retired  PLOF: Independent with basic ADLs  PATIENT GOALS : Pt states she would like to be able to reduce constant pain   OBJECTIVE:   DIAGNOSTIC FINDINGS:  IMPRESSION: 1. Interval L2-3 and L3-4 PLIF with probable solid arthrodesis. No recurrent impingement. There has been progression of degenerative disease at the remaining open lumbar levels. Mild discogenic edema at L1-2 and L5-S1. 2. L1-2 high-grade spinal stenosis and moderate left foraminal narrowing. 3. L4-5 high-grade spinal stenosis. 4. L5-S1 right more than left subarticular recess narrowing. Moderate bilateral foraminal narrowing.  PATIENT SURVEYS:  FOTO 45 49 at DC  44 FOTO 7/14 40 FOTO 8/15 40 FOTO 10/5  LUMBAR ROM:   Active  A/PROM  08/19/2021 AROM 7/14 10/5  Flexion 75% 75% 75%  Extension 25% p! 50% p! 50%  Right  lateral flexion 50%p! 75% p! 75%  Left lateral flexion 50% 50% p! 75%   Right rotation 50% 75% 75%  Left rotation 50% 75% 75%   (Blank rows = not tested)  LE MMT:  myotomal weakness of R LE noted at hip, knee, and ankle eversion, 4/5 quickly fatiguing  MMT Right 08/19/2021 Left 08/19/2021 R  7/14 L 7/14 Right / Left 8/15 Right / Left 10/5  Hip flexion myotomal weakness 4+/5 4/5 fatiguing 4+/5 3+/5 - 4/5 3+/5 - 4/5  Hip abduction myotomal weakness 4+/5 4/5 fatiguing 4+/5 4/5 - 4/5 4/5 - 4/5  Hip adduction myotomal weakness 4+/5 4/5 fatiguing 4+/5 4+/5 - 4+/5 4+/5 - 4+/5  Knee flexion myotomal weakness 4+/5 4/5 fatiguing 4+/5 4/5 - 4/5 4/5 - 4/5  Knee extension myotomal weakness 4+/5 4/5 fatiguing 4+/5 4+/5 - 4+/5 4+/5 - 4+/5  Ankle dorsiflexion 4+/5 4+/5 4/5 fatiguing 4+/5 4+/5 - 4+/5 4+/5 - 4+/5  Ankle eversion myotomal weakness 4+/5 4/5 fatiguing 4+/5 4+/5  4+/5    (Blank rows = not tested)  FUNCTIONAL TESTS:  5 times sit to stand: 19.1s  eval 7/14 5XSTS 13.1s 8/15 5xSTS 14.6 10/5 5XSTS: 13.2s    TODAY'S TREATMENT  Pt seen for aquatic therapy today.  Treatment took place in water 3.25-4 ft in depth at the [facility] pool. Temp of water was 93.  Pt entered/exited the pool via stairs (step to) independently with bilat rail.   - Warm up: forward/ backward walking;  sidestepping  (painful in R lateral hip moving to R) -seated on sm blue squoodle: ant/post pelvic tilts; hip hiking R/L and rotation - Holding wall:  hip openers (wide march); hurdles;  heel raises; squats; Leg swings front to/from back each; single LE clams x12 - High knee marching forward/backward with reciprocal arm swing - Kick board row le staggered R/L leading x 15 - Straddling yellow noodle, breast stroke arms/holding yellow hand buoys : cycling and add/abd; ski - 3 rounds  - 3 way tap ue support on yellow noodle   Pt requires buoyancy for support and to offload joints with strengthening exercises. Viscosity of the water is needed for resistance of strengthening; water  current perturbations provides challenge to standing balance unsupported, requiring increased core activation.  PATIENT EDUCATION:  Education details:  exercise progression Person educated: Patient Education method: Explanation, Demonstration, Tactile cues, Verbal cues Education comprehension: verbalized understanding, returned demonstration, verbal cues required, and tactile cues required   HOME EXERCISE PROGRAM: Access Code: [email or web address] URL: [email or web address] Date: 08/19/2021 Prepared by: Daleen Bo   ASSESSMENT:  CLINICAL IMPRESSION: Added lumbopelvic ROM seated on square noodle which she tolerates very well. Pt requires vc and demo for proper execution.  Some right hip discomfort but pt reports good stretch. R hip pain does continue throughout session with slight increase by end of session prior to cycling which "calms it down".  She is looking forward to appt with Ortho-Dr. Ninfa Linden on Monday. Also has appointment for a hip injection Dr Letta Pate middle of Nov.  Goals ongoing     OBJECTIVE IMPAIRMENTS Abnormal gait, decreased activity tolerance, decreased balance, decreased endurance, decreased knowledge of condition, decreased mobility, difficulty walking, decreased ROM, decreased strength, hypomobility, increased muscle spasms, impaired flexibility, improper body mechanics, postural dysfunction, and pain.   ACTIVITY LIMITATIONS cleaning, community activity, driving, occupation, laundry, yard work, shopping, and exercise .   PERSONAL FACTORS Age, Behavior pattern, Fitness, Past/current experiences, Time since onset of injury/illness/exacerbation, and 1-2 comorbidities:    are also affecting  patient's functional outcome.    REHAB POTENTIAL: Fair    CLINICAL DECISION MAKING: Evolving/moderate complexity  EVALUATION COMPLEXITY: Moderate   GOALS:   SHORT TERM GOALS: Target date: 09/30/2021    Pt will become independent with HEP in order to demonstrate  synthesis of PT education.  Baseline: Goal status: MET  2.  Pt will be able to demonstrate STS with full hip extension in order to demonstrate functional improvement in R hip strength and ROM for self-care and house hold duties.  Baseline:  Goal status: MET  3.  Pt will score at least 5 pt increase on FOTO to demonstrate functional improvement in MCII and pt perceived function.   Baseline:  Goal status: ongoing   LONG TERM GOALS: Target date: 04/12/2021   Pt will become independent with final HEP in order to demonstrate synthesis of PT education.  Baseline:  Goal status: ongoing  2.  Pt will be able to perform 5XSTS in under 12 in order to demonstrate functional improvement above the cut off score for older adults.  Baseline:  Goal status: ongoing  3.  Pt will be able to demonstrate ability to perform step to pattern on stairs without UE and pain in order to demonstrate functional improvement in L/S and LE function for self-care and house hold duties.  Baseline:  Goal status: ongoing  4.  Pt will score >/= 49 on FOTO to demonstrate improvement in perceived L/S function.  Baseline:  Goal status: ongoing    PLAN: PT FREQUENCY: 1-2x/week  PT DURATION: 8 weeks  PLANNED INTERVENTIONS: Therapeutic exercises, Therapeutic activity, Neuromuscular re-education, Balance training, Gait training, Patient/Family education, Joint manipulation, Joint mobilization, Stair training, Orthotic/Fit training, Aquatic Therapy, Dry Needling, Electrical stimulation, Spinal manipulation, Spinal mobilization, Cryotherapy, Moist heat, Compression bandaging, scar mobilization, Taping, Vasopneumatic device, Traction, Ultrasound, Ionotophoresis 65m/ml Dexamethasone, and Manual therapy.  PLAN FOR NEXT SESSION:  progress aquatic HEP weekly [facility] and R hip mobility, strength, general conditioning. Add 3 way leg kick to HEP, if continues to tolerate.   MStanton Kidney([place] Esli Clements  MPT 01/27/22 12:09 PM [facility] [address] [address]Phone: [phone]  Fax:  [phone]

## 2022-01-29 ENCOUNTER — Other Ambulatory Visit: Payer: Self-pay | Admitting: Internal Medicine

## 2022-01-30 ENCOUNTER — Encounter: Payer: Self-pay | Admitting: Orthopaedic Surgery

## 2022-01-30 ENCOUNTER — Ambulatory Visit (INDEPENDENT_AMBULATORY_CARE_PROVIDER_SITE_OTHER): Payer: Medicare HMO | Admitting: Orthopaedic Surgery

## 2022-01-30 ENCOUNTER — Other Ambulatory Visit (HOSPITAL_BASED_OUTPATIENT_CLINIC_OR_DEPARTMENT_OTHER): Payer: Self-pay

## 2022-01-30 VITALS — Ht 65.0 in | Wt 170.0 lb

## 2022-01-30 DIAGNOSIS — M7061 Trochanteric bursitis, right hip: Secondary | ICD-10-CM

## 2022-01-30 DIAGNOSIS — M1611 Unilateral primary osteoarthritis, right hip: Secondary | ICD-10-CM

## 2022-01-30 DIAGNOSIS — M25551 Pain in right hip: Secondary | ICD-10-CM | POA: Diagnosis not present

## 2022-01-30 MED ORDER — FLUAD QUADRIVALENT 0.5 ML IM PRSY
PREFILLED_SYRINGE | INTRAMUSCULAR | 0 refills | Status: DC
  Filled 2022-01-30: qty 0.5, 1d supply, fill #0

## 2022-01-30 MED ORDER — LIDOCAINE HCL 1 % IJ SOLN
3.0000 mL | INTRAMUSCULAR | Status: AC | PRN
  Administered 2022-01-30: 3 mL

## 2022-01-30 MED ORDER — METHYLPREDNISOLONE ACETATE 40 MG/ML IJ SUSP
40.0000 mg | INTRAMUSCULAR | Status: AC | PRN
  Administered 2022-01-30: 40 mg via INTRA_ARTICULAR

## 2022-01-30 NOTE — Progress Notes (Signed)
Office Visit Note   Patient: [name]           Date of Birth: 1946/02/17           MRN: [id number] Visit Date: 01/30/2022              Requested by: [name], [address] [place],  [place] [address] PCP: Michelle Branch, MD   Assessment & Plan: Visit Diagnoses: No diagnosis found.  Plan: I do feel the component of her pain is back related.  However she is experiencing enough hip pain but I recommended a steroid injection today over the right hip trochanteric area and then when she sees Dr. Providence Black in later November he will provide an intra-articular steroid injection in her right hip joint.  I will then see her back in about 6 weeks and we can put it together in terms of which injection helped for the most and what our next recommendations would be.  She states that she would like to avoid any type of surgical intervention so hopefully these types of less invasive interventions will help.  All questions and concerns were answered and addressed.  Follow-Up Instructions: Return in about 6 weeks (around 03/13/2022).   Orders:  No orders of the defined types were placed in this encounter.  No orders of the defined types were placed in this encounter.     Procedures: Large Joint Inj: R greater trochanter on 01/30/2022 11:06 AM Indications: pain and diagnostic evaluation Details: 22 G 1.5 in needle, lateral approach  Arthrogram: No  Medications: 3 mL lidocaine 1 %; 40 mg methylPREDNISolone acetate 40 MG/ML Outcome: tolerated well, no immediate complications Procedure, treatment alternatives, risks and benefits explained, specific risks discussed. Consent was given by the patient. Immediately prior to procedure a time out was called to verify the correct patient, procedure, equipment, support staff and site/side marked as required. Patient was prepped and draped in the usual sterile fashion.       Clinical Data: No additional  findings.   Subjective: Chief Complaint  Patient presents with   Right Hip - Pain  The patient is sometimes seen for the first time.  She is seen and evaluate and treat right hip pain.  She has an extensive history in terms of a significant lumbar spine fusion.  She has been developing right hip pain for some time now she states.  She has been going to physical therapy.  She has seen Dr. Providence Black who she said is planning to perform an intra-articular injection in her right hip joint in early November.  She reports pain over the lateral aspect of her right hip and in her low back to the right side.  [facility] few years ago.  She denies any radicular symptoms going down her leg and into her feet.  She is not a diabetic.  HPI  Review of Systems There is no listed fever, chills, nausea, vomiting  Objective: Vital Signs: Ht '5\' 5"'$  (1.651 m)   Wt 170 lb (77.1 kg)   LMP 04/10/1998 (Approximate)   BMI 28.29 kg/m   Physical Exam She is alert and orient x3 and in no acute distress Ortho Exam On exam she is slow to get up from a seated position and slow to get on the exam table.  Her right hip has smooth range of motion but there is pain over the trochanteric area on the  extremes of range of motion of her right hip.  There is just some slight stiffness in the right hip.  Her left hip exam is entirely normal.  There is still complaining of low back pain as well. Specialty Comments:  No specialty comments available.  Imaging: No results found. X-rays independently reviewed of the canopy system of right hip and pelvis showed moderate arthritic changes with superior lateral joint space narrowing and osteophytes around the right hip.  There are sclerotic changes as well.  PMFS History: Patient Active Problem List   Diagnosis Date Noted   Primary osteoarthritis of right hip 12/13/2021   Spondylosis without myelopathy or radiculopathy, lumbar region  12/13/2021   Prediabetes 11/24/2021   Other fatigue 11/23/2021   SOB (shortness of breath) on exertion 11/23/2021   Hyperlipidemia 11/23/2021   Hyperglycemia 11/23/2021   Vitamin D deficiency 11/23/2021   Vitamin B 12 deficiency 11/23/2021   Class 1 obesity with serious comorbidity and body mass index (BMI) of 30.0 to 30.9 in adult 11/23/2021   Fusion of lumbar spine 12/03/2019   COPD (chronic obstructive pulmonary disease) ([place]) 08/22/2019   Spinal stenosis of lumbar region with neurogenic claudication 11/22/2018   Coronary artery disease due to lipid rich plaque 10/03/2018   Closed fracture of distal end of radius 04/16/2017   Chronic back pain 03/15/2017   PCP NOTES >>>>>>>>>>>>>>>>>>>> 05/24/2015   Numbness and tingling of right arm 05/22/2015   Insomnia 09/01/2014   GERD (gastroesophageal reflux disease) 03/27/2013   Dysphagia, pharyngoesophageal phase 03/27/2013   Annual physical exam 06/12/2011   Sinusitis, chronic 02/02/2011   Osteoporosis 06/08/2010   Fibromyalgia, pain mngmt 01/29/2009   LIPOMA 09/30/2008   Migraine without aura 03/04/2007   Hypothyroidism 05/15/2006   COLONIC POLYPS, HX OF 05/15/2006   Past Medical History:  Diagnosis Date   Arthritis    Back pain    Back pain    Cataract    both eyes rt. eye was removed   Chest pain    stress test @ [place] [facility] (-)   Colonic polyp    bx adenomatous polyps, next scope 2010   Constipation    COPD (chronic obstructive pulmonary disease) ([place])    Cornea disorder    NODULES REMOVED FROM CORNEA   Fibromyalgia    see rheumatology   GERD (gastroesophageal reflux disease)    PAST HX    High blood pressure    High cholesterol    Hyperlipidemia    Hypothyroidism    Joint pain    Migraine    Ocular rosacea 03/2009   on doxy   Osteoporosis    osteopenia   Pleuritic chest pain    etiology unclear, has beeb eval by pulmonary and rheumatology (autoimmune dz?)   Swelling of lower extremity    Wrist  fracture 2018    Family History  Adopted: Yes  Problem Relation Age of Onset   Cancer Neg Hx    Depression Neg Hx    Diabetes Neg Hx    Stroke Neg Hx    Hypertension Neg Hx    Heart disease Neg Hx     Past Surgical History:  Procedure Laterality Date   ANTERIOR LAT LUMBAR FUSION N/A 12/03/2019   Procedure: ANTERIOR LATERAL LUMBAR FUSION L2-4;  Surgeon: Melina Schools, MD;  Location: [facility];  Service: Orthopedics;  Laterality: N/A;  4 hrs   COLONOSCOPY     COSMETIC SURGERY  04/12/2018   L upper eyelid ptosis repair, and trichophytic brow  lift   ESOPHAGOGASTRODUODENOSCOPY (EGD) WITH PROPOFOL N/A 03/27/2013   Procedure: ESOPHAGOGASTRODUODENOSCOPY (EGD) WITH PROPOFOL;  Surgeon: Milus Banister, MD;  Location: [facility];  Service: Endoscopy;  Laterality: N/A;  possible dil   EYE SURGERY     growth on cornea-bil.   plastic surgery face  2013   POLYPECTOMY     right rotator cuff repair  2010   TONSILLECTOMY     UPPER GASTROINTESTINAL ENDOSCOPY     Social History   Occupational History   Occupation: retired, 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  Tobacco Use   Smoking status: Former    Packs/day: 1.00    Years: 38.50    Total pack years: 38.50    Types: Cigarettes    Quit date: 01/13/2004    Years since quitting: 18.0   Smokeless tobacco: Never   Tobacco comments:    quit 2005  Vaping Use   Vaping Use: Never used  Substance and Sexual Activity   Alcohol use: No    Alcohol/week: 0.0 standard drinks of alcohol    Comment: rare   Drug use: No   Sexual activity: Not Currently    Comment: first intercourse >16

## 2022-01-31 ENCOUNTER — Ambulatory Visit (HOSPITAL_BASED_OUTPATIENT_CLINIC_OR_DEPARTMENT_OTHER): Payer: Medicare HMO | Admitting: Physical Therapy

## 2022-01-31 ENCOUNTER — Encounter: Payer: Self-pay | Admitting: Internal Medicine

## 2022-02-01 ENCOUNTER — Ambulatory Visit (INDEPENDENT_AMBULATORY_CARE_PROVIDER_SITE_OTHER): Payer: Medicare HMO | Admitting: Bariatrics

## 2022-02-01 ENCOUNTER — Encounter: Payer: Self-pay | Admitting: Bariatrics

## 2022-02-01 VITALS — BP 110/71 | HR 59 | Temp 98.2°F | Ht 65.0 in | Wt 167.0 lb

## 2022-02-01 DIAGNOSIS — K5909 Other constipation: Secondary | ICD-10-CM

## 2022-02-01 DIAGNOSIS — Z6827 Body mass index (BMI) 27.0-27.9, adult: Secondary | ICD-10-CM

## 2022-02-01 DIAGNOSIS — E669 Obesity, unspecified: Secondary | ICD-10-CM | POA: Diagnosis not present

## 2022-02-01 DIAGNOSIS — H18452 Nodular corneal degeneration, left eye: Secondary | ICD-10-CM | POA: Diagnosis not present

## 2022-02-02 NOTE — Therapy (Signed)
OUTPATIENT PHYSICAL THERAPY PROGRESS NOTE          PHYSICAL THERAPY DISCHARGE SUMMARY  Visits from Start of Care: 18  Current functional level related to goals / functional outcomes: Pt is safe and indep with all ADL's and functional mobility   Remaining deficits:Strength and ROM limited by pain   Education / Equipment: Management of condition; HEP   Patient agrees to discharge. Patient goals were partially met. Patient is being discharged due to the patient's request.  Patient Name: [name] MRN: [id number] DOB:12/16/1945, 76 y.o., female Today's Date: 02/03/2022   PT End of Session - 02/03/22 1312     Visit Number 18    Number of Visits 21    Date for PT Re-Evaluation 04/12/22    Authorization Type [place] MCR    Authorization Time Period 01/12/22-04/12/22    Progress Note Due on Visit 19    PT Start Time 1202    PT Stop Time 1240    PT Time Calculation (min) 38 min    Activity Tolerance Patient tolerated treatment well    Behavior During Therapy WFL for tasks assessed/performed                 Past Medical History:  Diagnosis Date   Arthritis    Back pain    Back pain    Cataract    both eyes rt. 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   Fibromyalgia    see rheumatology   GERD (gastroesophageal reflux disease)    PAST HX    High blood pressure    High cholesterol    Hyperlipidemia    Hypothyroidism    Joint pain    Migraine    Ocular rosacea 03/2009   on doxy   Osteoporosis    osteopenia   Pleuritic chest pain    etiology unclear, has beeb eval by pulmonary and rheumatology (autoimmune dz?)   Swelling of lower extremity    Wrist fracture 2018   Past Surgical History:  Procedure Laterality Date   ANTERIOR LAT LUMBAR FUSION N/A  12/03/2019   Procedure: ANTERIOR LATERAL LUMBAR FUSION L2-4;  Surgeon: Melina Schools, MD;  Location: [facility];  Service: Orthopedics;  Laterality: N/A;  4 hrs   COLONOSCOPY     COSMETIC SURGERY  04/12/2018   L upper eyelid ptosis repair, and trichophytic brow lift   ESOPHAGOGASTRODUODENOSCOPY (EGD) WITH PROPOFOL N/A 03/27/2013   Procedure: ESOPHAGOGASTRODUODENOSCOPY (EGD) WITH PROPOFOL;  Surgeon: Milus Banister, MD;  Location: [facility];  Service: Endoscopy;  Laterality: N/A;  possible dil   EYE SURGERY     growth on cornea-bil.   plastic surgery face  2013   POLYPECTOMY     right rotator cuff repair  2010   TONSILLECTOMY     UPPER GASTROINTESTINAL ENDOSCOPY     Patient Active Problem List   Diagnosis Date Noted   Other constipation 02/01/2022   Primary osteoarthritis of right hip 12/13/2021   Spondylosis without myelopathy or radiculopathy, lumbar region 12/13/2021   Prediabetes 11/24/2021   Other fatigue 11/23/2021   SOB (shortness of breath) on exertion 11/23/2021   Hyperlipidemia 11/23/2021   Hyperglycemia 11/23/2021   Vitamin D deficiency 11/23/2021   Vitamin  B 12 deficiency 11/23/2021   Class 1 obesity with serious comorbidity and body mass index (BMI) of 30.0 to 30.9 in adult 11/23/2021   Fusion of lumbar spine 12/03/2019   COPD (chronic obstructive pulmonary disease) ([place]) 08/22/2019   Spinal stenosis of lumbar region with neurogenic claudication 11/22/2018   Coronary artery disease due to lipid rich plaque 10/03/2018   Closed fracture of distal end of radius 04/16/2017   Chronic back pain 03/15/2017   PCP NOTES >>>>>>>>>>>>>>>>>>>> 05/24/2015   Numbness and tingling of right arm 05/22/2015   Insomnia 09/01/2014   GERD (gastroesophageal reflux disease) 03/27/2013   Dysphagia, pharyngoesophageal phase 03/27/2013   Annual physical exam 06/12/2011   Sinusitis, chronic 02/02/2011   Osteoporosis 06/08/2010   Fibromyalgia, pain mngmt 01/29/2009   LIPOMA 09/30/2008    Migraine without aura 03/04/2007   Hypothyroidism 05/15/2006   COLONIC POLYPS, HX OF 05/15/2006    PCP:  Kathlene November, MD  REFERRING PROVIDER:  Alysia Penna, MD  REFERRING DIAG:  [phone] (ICD-10-CM) - Spondylosis without myelopathy or radiculopathy, lumbar region  M96.1 (ICD-10-CM) - Lumbar post-laminectomy syndrome    THERAPY DIAG:  Other low back pain  Pain in right hip  Muscle weakness (generalized)  Difficulty walking  ONSET DATE: March 2023  SUBJECTIVE:                                                                                                                                                                                           SUBJECTIVE STATEMENT: "pain is still there.  There really hasn't been any improvements,I don't really think the therapy is helping"   PERTINENT HISTORY:  wrist fx, pleuritic chest pain, osteoporosis, migraine, hypothyroidism, glaucoma, fibromyalgia, R RTC repair, anterior lateral lumbar fusion L2-12 Nov 2019   PAIN:  Are you having pain? Yes: NPRS scale: 5/10 Pain location: back and R  hip Pain description: tightness, ache Aggravating factors: bending, lifting, stairs, sleeping, sitting, standing too long,  Relieving factors: ice, stretching, pain pill   PRECAUTIONS: None  WEIGHT BEARING RESTRICTIONS No  FALLS:  Has patient fallen in last 6 months? No  LIVING ENVIRONMENT: Lives with: lives with their family and lives with their spouse Lives in: House/apartment Stairs: Yes 2 story home Has following equipment at home: None  OCCUPATION: retired  PLOF: Independent with basic ADLs  PATIENT GOALS : Pt states she would like to be able to reduce constant pain   OBJECTIVE:   DIAGNOSTIC FINDINGS:  IMPRESSION: 1. Interval L2-3 and L3-4 PLIF with probable solid arthrodesis. No recurrent impingement. There has been progression of degenerative disease at the remaining open lumbar levels. Mild discogenic edema  at L1-2 and  L5-S1. 2. L1-2 high-grade spinal stenosis and moderate left foraminal narrowing. 3. L4-5 high-grade spinal stenosis. 4. L5-S1 right more than left subarticular recess narrowing. Moderate bilateral foraminal narrowing.  PATIENT SURVEYS:  FOTO 45 49 at DC  44 FOTO 7/14 40 FOTO 8/15 40 FOTO 10/5  LUMBAR ROM:   Active  A/PROM  08/19/2021 AROM 7/14 10/5  Flexion 75% 75% 75%  Extension 25% p! 50% p! 50%  Right lateral flexion 50%p! 75% p! 75%  Left lateral flexion 50% 50% p! 75%  Right rotation 50% 75% 75%  Left rotation 50% 75% 75%   (Blank rows = not tested)  LE MMT:  myotomal weakness of R LE noted at hip, knee, and ankle eversion, 4/5 quickly fatiguing  MMT Right 08/19/2021 Left 08/19/2021 R  7/14 L 7/14 Right / Left 8/15 Right / Left 10/5  Hip flexion myotomal weakness 4+/5 4/5 fatiguing 4+/5 3+/5 - 4/5 3+/5 - 4/5  Hip abduction myotomal weakness 4+/5 4/5 fatiguing 4+/5 4/5 - 4/5 4/5 - 4/5  Hip adduction myotomal weakness 4+/5 4/5 fatiguing 4+/5 4+/5 - 4+/5 4+/5 - 4+/5  Knee flexion myotomal weakness 4+/5 4/5 fatiguing 4+/5 4/5 - 4/5 4/5 - 4/5  Knee extension myotomal weakness 4+/5 4/5 fatiguing 4+/5 4+/5 - 4+/5 4+/5 - 4+/5  Ankle dorsiflexion 4+/5 4+/5 4/5 fatiguing 4+/5 4+/5 - 4+/5 4+/5 - 4+/5  Ankle eversion myotomal weakness 4+/5 4/5 fatiguing 4+/5 4+/5  4+/5    (Blank rows = not tested)  FUNCTIONAL TESTS:  5 times sit to stand: 19.1s  eval 7/14 5XSTS 13.1s 8/15 5xSTS 14.6 10/5 5XSTS: 13.2s    TODAY'S TREATMENT  Pt seen for aquatic therapy today.  Treatment took place in water 3.25-4 ft in depth at the [facility] pool. Temp of water was 93.  Pt entered/exited the pool via stairs (step to) independently with bilat rail.   HEP issued and reviewed as we complete throughout session   - Warm up: forward/ backward walking;  sidestepping  (painful in R lateral hip moving to R) -seated on sm blue squoodle: ant/post pelvic tilts; hip hiking R/L and  rotation - Holding wall:  hip circles;  heel raises; squats; Leg swings front to/from back each; single LE clams x12, add/abd    Pt requires buoyancy for support and to offload joints with strengthening exercises. Viscosity of the water is needed for resistance of strengthening; water current perturbations provides challenge to standing balance unsupported, requiring increased core activation.  PATIENT EDUCATION:  Education details:  exercise progression Person educated: Patient Education method: Explanation, Demonstration, Tactile cues, Verbal cues Education comprehension: verbalized understanding, returned demonstration, verbal cues required, and tactile cues required   HOME EXERCISE PROGRAM: Access Code: [email or web address] URL: [email or web address] Date: 08/19/2021 Prepared by: Daleen Bo   ASSESSMENT:  CLINICAL IMPRESSION: Pt had cortizone sehot R hip via Dr Rush Farmer.  She reports decreased pain for 3 days, then pivoted wrong and it returned. She is debating over getting next injection from Dr Letta Pate as she isn't sure it will help.  After discussion she and I have decided to discontinue threapy at this time as she is not seeing any benefit.  She is encouraged to gain access to a pool and complete laminated HEP issued today.  She will return after 1st of year if appropriate after possible other medical interventions.     OBJECTIVE IMPAIRMENTS Abnormal gait, decreased activity tolerance, decreased balance, decreased endurance, decreased knowledge of condition, decreased mobility, difficulty walking, decreased ROM,  decreased strength, hypomobility, increased muscle spasms, impaired flexibility, improper body mechanics, postural dysfunction, and pain.   ACTIVITY LIMITATIONS cleaning, community activity, driving, occupation, laundry, yard work, shopping, and exercise .   PERSONAL FACTORS Age, Behavior pattern, Fitness, Past/current experiences, Time since onset of  injury/illness/exacerbation, and 1-2 comorbidities:    are also affecting patient's functional outcome.    REHAB POTENTIAL: Fair    CLINICAL DECISION MAKING: Evolving/moderate complexity  EVALUATION COMPLEXITY: Moderate   GOALS:   SHORT TERM GOALS: Target date: 09/30/2021    Pt will become independent with HEP in order to demonstrate synthesis of PT education.  Baseline: Goal status: MET  2.  Pt will be able to demonstrate STS with full hip extension in order to demonstrate functional improvement in R hip strength and ROM for self-care and house hold duties.  Baseline:  Goal status: MET  3.  Pt will score at least 5 pt increase on FOTO to demonstrate functional improvement in MCII and pt perceived function.   Baseline:  Goal status: Not met   LONG TERM GOALS: Target date: 04/12/2021   Pt will become independent with final HEP in order to demonstrate synthesis of PT education.  Baseline:  Goal status: MET  2.  Pt will be able to perform 5XSTS in under 12 in order to demonstrate functional improvement above the cut off score for older adults.  Baseline:  Goal status:not met  3.  Pt will be able to demonstrate ability to perform step to pattern on stairs without UE and pain in order to demonstrate functional improvement in L/S and LE function for self-care and house hold duties.  Baseline:  Goal status: Not met  4.  Pt will score >/= 49 on FOTO to demonstrate improvement in perceived L/S function.  Baseline:  Goal status: Not met    PLAN: PT FREQUENCY: 1-2x/week  PT DURATION: 8 weeks  PLANNED INTERVENTIONS: Therapeutic exercises, Therapeutic activity, Neuromuscular re-education, Balance training, Gait training, Patient/Family education, Joint manipulation, Joint mobilization, Stair training, Orthotic/Fit training, Aquatic Therapy, Dry Needling, Electrical stimulation, Spinal manipulation, Spinal mobilization, Cryotherapy, Moist heat, Compression bandaging, scar  mobilization, Taping, Vasopneumatic device, Traction, Ultrasound, Ionotophoresis 51m/ml Dexamethasone, and Manual therapy.  PLAN FOR NEXT SESSION:  progress aquatic [facility]P [facility] mobility, strength, general conditioning. Add 3 way leg kick to HEP, if continues to tolerate.   [address] Nico Rogness MPT 02/03/22 1:13 PM [facility] [address] [address]Phone: [phone]  Fax:  [phone]

## 2022-02-03 ENCOUNTER — Encounter (HOSPITAL_BASED_OUTPATIENT_CLINIC_OR_DEPARTMENT_OTHER): Payer: Self-pay | Admitting: Physical Therapy

## 2022-02-03 ENCOUNTER — Ambulatory Visit (HOSPITAL_BASED_OUTPATIENT_CLINIC_OR_DEPARTMENT_OTHER): Payer: Medicare HMO | Admitting: Physical Therapy

## 2022-02-03 DIAGNOSIS — R262 Difficulty in walking, not elsewhere classified: Secondary | ICD-10-CM | POA: Diagnosis not present

## 2022-02-03 DIAGNOSIS — M5459 Other low back pain: Secondary | ICD-10-CM | POA: Diagnosis not present

## 2022-02-03 DIAGNOSIS — M25551 Pain in right hip: Secondary | ICD-10-CM | POA: Diagnosis not present

## 2022-02-03 DIAGNOSIS — M6281 Muscle weakness (generalized): Secondary | ICD-10-CM | POA: Diagnosis not present

## 2022-02-06 NOTE — Progress Notes (Signed)
Chief Complaint:   OBESITY [name] is here to discuss her progress with her obesity treatment plan along with follow-up of her obesity related diagnoses. [name] is on the [employer] and states she is following her eating plan approximately 80 % of the time. [name] states she is doing 0 for 0 minutes 0 times per week.  Today's visit was #: 4 Starting weight: 184 lbs Starting date: 11/23/2021 Today's weight: 167 lbs Today's date: 02/01/2022 Total lbs lost to date: 17 lbs Total lbs lost since last in-office visit: 3 lbs  Interim History: [name] is down 3 lbs since the her last visit.  Subjective:   1. Other constipation [name] is taking Miralax, Stool softener, Probiotic gummies and Dulcolax.   Assessment/Plan:   1. Other constipation [name] has chronic constipation. We may consider Linzess in the future. She will continue Fleet suppositories as need, Milk of Magnesia 300 mg OTC nightly as needed. She was given Fiber handout sheet today.    2. Obesity, current BMI 27.8 [name] is currently in the action stage of change. As such, her goal is to continue with weight loss efforts. She has agreed to keeping a food journal and adhering to recommended goals of 1200 calories and 80 grams of protein and the [place].   Intentional eating was discussed today and she journal as needed.   Exercise goals: No exercise has been prescribed at this time.  Behavioral modification strategies: increasing lean protein intake, decreasing simple carbohydrates, increasing vegetables, increasing water intake, decreasing eating out, no skipping meals, meal planning and cooking strategies, keeping healthy foods in the home, and planning for success.  [name] has agreed to follow-up with our clinic in 3 weeks. She was informed of the importance of frequent follow-up visits to maximize her success with intensive lifestyle modifications for her multiple health conditions.   Objective:    Blood pressure 110/71, pulse (!) 59, temperature 98.2 F (36.8 C), height '5\' 5"'$  (1.651 m), weight 167 lb (75.8 kg), last menstrual period 04/10/1998, SpO2 99 %. Body mass index is 27.79 kg/m.  General: Cooperative, alert, well developed, in no acute distress. HEENT: Conjunctivae and lids unremarkable. Cardiovascular: Regular rhythm.  Lungs: Normal work of breathing. Neurologic: No focal deficits.   Lab Results  Component Value Date   CREATININE 0.82 11/10/2021   BUN 14 11/10/2021   NA 141 11/10/2021   K 3.5 11/10/2021   CL 101 11/10/2021   CO2 32 11/10/2021   Lab Results  Component Value Date   ALT 11 07/26/2021   AST 14 07/26/2021   ALKPHOS 78 10/18/2020   BILITOT 0.6 10/18/2020   Lab Results  Component Value Date   HGBA1C 5.7 (H) 11/23/2021   HGBA1C 5.8 03/24/2021   HGBA1C 5.5 05/22/2015   Lab Results  Component Value Date   INSULIN 8.7 11/23/2021   Lab Results  Component Value Date   TSH 1.97 11/10/2021   Lab Results  Component Value Date   CHOL 154 07/26/2021   HDL 54.30 07/26/2021   LDLCALC 71 07/26/2021   LDLDIRECT 92.0 03/04/2007   TRIG 143.0 07/26/2021   CHOLHDL 3 07/26/2021   Lab Results  Component Value Date   VD25OH 47.1 11/23/2021   VD25OH 44.87 10/21/2021   VD25OH 46.9 10/05/2020   Lab Results  Component Value Date   WBC 6.8 11/10/2021   HGB 13.7 11/10/2021   HCT 40.7 11/10/2021   MCV 91.0 11/10/2021   PLT 281.0 11/10/2021   No results  found for: "IRON", "TIBC", "FERRITIN"  Attestation Statements:   Reviewed by clinician on day of visit: allergies, medications, problem list, medical history, surgical history, family history, social history, and previous encounter notes.   Graylon Good, am acting as Location manager for [employer], DO.  I have reviewed the above documentation for accuracy and completeness, and I agree with the above. Jearld Lesch, DO

## 2022-02-08 DIAGNOSIS — H18453 Nodular corneal degeneration, bilateral: Secondary | ICD-10-CM | POA: Diagnosis not present

## 2022-02-08 DIAGNOSIS — H5052 Exophoria: Secondary | ICD-10-CM | POA: Diagnosis not present

## 2022-02-16 ENCOUNTER — Ambulatory Visit: Payer: Medicare HMO | Admitting: Physical Medicine & Rehabilitation

## 2022-02-16 ENCOUNTER — Telehealth: Payer: Self-pay | Admitting: Internal Medicine

## 2022-02-16 NOTE — Telephone Encounter (Signed)
PDMP okay, Rx sent 

## 2022-02-16 NOTE — Telephone Encounter (Signed)
Requesting: alprazolam 0.'5mg'$   Contract: 11/10/21 UDS: 11/10/21 Last Visit: 11/10/21 Next Visit: 04/13/22 Last Refill: 10/19/21 #30 and 3RF   Please Advise

## 2022-02-20 ENCOUNTER — Encounter: Payer: Self-pay | Admitting: Bariatrics

## 2022-02-20 ENCOUNTER — Ambulatory Visit (INDEPENDENT_AMBULATORY_CARE_PROVIDER_SITE_OTHER): Payer: Medicare HMO | Admitting: Bariatrics

## 2022-02-20 VITALS — BP 108/70 | HR 64 | Temp 97.9°F | Ht 65.0 in | Wt 164.0 lb

## 2022-02-20 DIAGNOSIS — Z6827 Body mass index (BMI) 27.0-27.9, adult: Secondary | ICD-10-CM

## 2022-02-20 DIAGNOSIS — E038 Other specified hypothyroidism: Secondary | ICD-10-CM | POA: Diagnosis not present

## 2022-02-20 DIAGNOSIS — R7303 Prediabetes: Secondary | ICD-10-CM | POA: Diagnosis not present

## 2022-02-20 DIAGNOSIS — E669 Obesity, unspecified: Secondary | ICD-10-CM

## 2022-02-21 ENCOUNTER — Encounter: Payer: Medicare HMO | Attending: Physical Medicine & Rehabilitation | Admitting: Physical Medicine & Rehabilitation

## 2022-02-21 DIAGNOSIS — M25551 Pain in right hip: Secondary | ICD-10-CM | POA: Insufficient documentation

## 2022-02-28 ENCOUNTER — Telehealth: Payer: Self-pay | Admitting: Internal Medicine

## 2022-03-01 ENCOUNTER — Other Ambulatory Visit (HOSPITAL_BASED_OUTPATIENT_CLINIC_OR_DEPARTMENT_OTHER): Payer: Self-pay

## 2022-03-01 MED ORDER — HYDROCODONE-ACETAMINOPHEN 7.5-325 MG PO TABS
1.0000 | ORAL_TABLET | Freq: Four times a day (QID) | ORAL | 0 refills | Status: DC | PRN
  Filled 2022-03-01: qty 120, 30d supply, fill #0

## 2022-03-01 NOTE — Telephone Encounter (Signed)
Requesting: hydrocodone 7.5-'325mg'$  Contract: 11/10/21 UDS: 11/10/21 Last Visit: 11/10/21 Next Visit: 04/13/22 Last Refill: 12/29/21 #120 and 0RF (x2)  Please Advise

## 2022-03-01 NOTE — Telephone Encounter (Signed)
PDMP okay, Rx sent 

## 2022-03-06 ENCOUNTER — Encounter: Payer: Self-pay | Admitting: Internal Medicine

## 2022-03-06 ENCOUNTER — Encounter: Payer: Self-pay | Admitting: Bariatrics

## 2022-03-06 NOTE — Progress Notes (Signed)
Chief Complaint:   OBESITY [name] is here to discuss her progress with her obesity treatment plan along with follow-up of her obesity related diagnoses. [name] is on keeping a food journal and adhering to recommended goals of 1200 calories and 80 grams of protein and the [place] and states she is following her eating plan approximately 80% of the time. [name] states she is doing 0 minutes 0 times per week.  Today's visit was #: 5 Starting weight: 184 lbs Starting date: 11/23/2021 Today's weight: 164 lbs Today's date: 02/20/2022 Total lbs lost to date: 20 Total lbs lost since last in-office visit: 3  Interim History: [name] is down an additional 3 pounds and she has done well overall.  Subjective:   1. Prediabetes [name] is not on medications currently.  2. Other specified hypothyroidism [name] is taking Synthroid currently.  Assessment/Plan:   1. Prediabetes [name] will work on minimizing all carbohydrates (sweets and starches).   2. Other specified hypothyroidism [name] will continue Synthroid as directed.  3. Obesity, current BMI 27.4 [name] is currently in the action stage of change. As such, her goal is to continue with weight loss efforts. She has agreed to keeping a food journal and adhering to recommended goals of 1200 calories and 80 grams of protein and the [place].   She will continue to adhere closely to the plan 80-90%.  Increase protein and decrease carbohydrates.  Exercise goals: Continue exercise.   Behavioral modification strategies: increasing lean protein intake, decreasing simple carbohydrates, increasing vegetables, increasing water intake, decreasing eating out, no skipping meals, meal planning and cooking strategies, keeping healthy foods in the home, and planning for success.  [name] has agreed to follow-up with our clinic in 3 weeks. She was informed of the importance of frequent follow-up visits to maximize her  success with intensive lifestyle modifications for her multiple health conditions.    Objective:   Blood pressure 108/70, pulse 64, temperature 97.9 F (36.6 C), height '5\' 5"'$  (1.651 m), weight 164 lb (74.4 kg), last menstrual period 04/10/1998, SpO2 100 %. Body mass index is 27.29 kg/m.  General: Cooperative, alert, well developed, in no acute distress. HEENT: Conjunctivae and lids unremarkable. Cardiovascular: Regular rhythm.  Lungs: Normal work of breathing. Neurologic: No focal deficits.   Lab Results  Component Value Date   CREATININE 0.82 11/10/2021   BUN 14 11/10/2021   NA 141 11/10/2021   K 3.5 11/10/2021   CL 101 11/10/2021   CO2 32 11/10/2021   Lab Results  Component Value Date   ALT 11 07/26/2021   AST 14 07/26/2021   ALKPHOS 78 10/18/2020   BILITOT 0.6 10/18/2020   Lab Results  Component Value Date   HGBA1C 5.7 (H) 11/23/2021   HGBA1C 5.8 03/24/2021   HGBA1C 5.5 05/22/2015   Lab Results  Component Value Date   INSULIN 8.7 11/23/2021   Lab Results  Component Value Date   TSH 1.97 11/10/2021   Lab Results  Component Value Date   CHOL 154 07/26/2021   HDL 54.30 07/26/2021   LDLCALC 71 07/26/2021   LDLDIRECT 92.0 03/04/2007   TRIG 143.0 07/26/2021   CHOLHDL 3 07/26/2021   Lab Results  Component Value Date   VD25OH 47.1 11/23/2021   VD25OH 44.87 10/21/2021   VD25OH 46.9 10/05/2020   Lab Results  Component Value Date   WBC 6.8 11/10/2021   HGB 13.7 11/10/2021   HCT 40.7 11/10/2021   MCV 91.0 11/10/2021   PLT 281.0  11/10/2021   No results found for: "IRON", "TIBC", "FERRITIN"  Attestation Statements:   Reviewed by clinician on day of visit: allergies, medications, problem list, medical history, surgical history, family history, social history, and previous encounter notes.   Wilhemena Durie, am acting as Location manager for [employer], DO.  I have reviewed the above documentation for accuracy and completeness, and I agree with the  above. Jearld Lesch, DO

## 2022-03-07 ENCOUNTER — Encounter: Payer: Self-pay | Admitting: Physical Medicine & Rehabilitation

## 2022-03-07 ENCOUNTER — Encounter (HOSPITAL_BASED_OUTPATIENT_CLINIC_OR_DEPARTMENT_OTHER): Payer: Medicare HMO | Admitting: Physical Medicine & Rehabilitation

## 2022-03-07 VITALS — BP 100/70 | HR 71 | Ht 65.0 in | Wt 168.0 lb

## 2022-03-07 DIAGNOSIS — M25551 Pain in right hip: Secondary | ICD-10-CM

## 2022-03-07 MED ORDER — BETAMETHASONE SOD PHOS & ACET 6 (3-3) MG/ML IJ SUSP
12.0000 mg | Freq: Once | INTRAMUSCULAR | Status: AC
  Administered 2022-03-07: 12 mg via INTRAMUSCULAR

## 2022-03-07 MED ORDER — LIDOCAINE HCL (PF) 1 % IJ SOLN
4.0000 mL | Freq: Once | INTRAMUSCULAR | Status: AC
  Administered 2022-03-07: 4 mL

## 2022-03-07 MED ORDER — LIDOCAINE HCL 1 % IJ SOLN
10.0000 mL | Freq: Once | INTRAMUSCULAR | Status: AC
  Administered 2022-03-07: 10 mL

## 2022-03-07 NOTE — Progress Notes (Signed)
hip intra-articular injection under fluro guidance  Indication osteoarthritis unresponsive to conservative care including exercise and oral medications  Informed consent was obtained after describing risks and benefits of the procedure, this includes bleeding bruising and infection. The patient elected to proceed and has given written consent Pt with hx steroid flush after R hip troch bursa injection with depo medrol '40mg'$  ~5wks ago.pt has hx shrimp allergy but has tolerated omnipaque for ESI and CT scan  Patient placed supine on exam table. GE C arm utilized Femur was identified distally and followed proximally to the femoral neck. Area was marked and prepped with Betadine. 25-gauge 1.5 inch needle was utilized to anesthetize the skin and subcutaneous tissue with 3 cc of 1% lidocaine. Then a 22-gauge 3.5 in spinal quincke needle was inserted under direct fluoro visualization, targeting the junction of the femoral head and femoral neck. Bone contact was made. Omnipaque 180 x 38m injected to confirm intra articular location Then a solution containing 0.5 mL of 6 mg per mL/celestone and 3.5 ML of 1% lidocaine were injected after negative drawback for blood. Patient tolerated procedure well. Post procedure instructions given.

## 2022-03-07 NOTE — Progress Notes (Signed)
  PROCEDURE RECORD [facility]   Name: [name] DOB:[birth year] MRN: [id number]  Date:03/07/2022  Physician: Alysia Penna, MD    Nurse/CMA: Truman Hayward, CMA  Allergies:  Allergies  Allergen Reactions   Alendronate Sodium Hives   Bactrim [Sulfamethoxazole-Trimethoprim]     Upset stomach   Contrast Media [Iodinated Contrast Media] Hives   Iodine Hives   Milnacipran     REACTION: increased bp, increased hr   Penicillins Hives and Other (See Comments)    REACTION: urticaria (hives)   Prednisone Rash    Consent Signed: Yes.    Is patient diabetic? No.  CBG today? .  Pregnant: No. LMP: Patient's last menstrual period was 04/10/1998 (approximate). (age 12-55)  Anticoagulants: no Anti-inflammatory: no Antibiotics: no  Procedure: Right Hip Injection  Position: Supine Start Time: 11:57 am  End Time: 12:03 pm  Fluoro Time: 53  RN/CMA Truman Hayward, CMA Holliday Sheaffer, CMA    Time 11:45 am 12:05 pm    BP 100/70  109/78    Pulse 71 63    Respirations 16 16    O2 Sat 96 96    S/S 6 6    Pain Level 6/10 2/10     D/C home with husband, patient A & O X 3, D/C instructions reviewed, and sits independently.

## 2022-03-07 NOTE — Patient Instructions (Signed)
Please call to schedule another Right hip injection with fluoro guidance if recommended by Dr Ninfa Linden

## 2022-03-13 ENCOUNTER — Other Ambulatory Visit: Payer: Self-pay | Admitting: Internal Medicine

## 2022-03-13 ENCOUNTER — Ambulatory Visit: Payer: Medicare HMO | Admitting: Orthopaedic Surgery

## 2022-03-14 ENCOUNTER — Other Ambulatory Visit (HOSPITAL_BASED_OUTPATIENT_CLINIC_OR_DEPARTMENT_OTHER): Payer: Self-pay

## 2022-03-14 MED ORDER — COVID-19 MRNA 2023-2024 VACCINE (COMIRNATY) 0.3 ML INJECTION
0.3000 mL | Freq: Once | INTRAMUSCULAR | 0 refills | Status: AC
  Filled 2022-03-14: qty 0.3, 1d supply, fill #0

## 2022-03-15 ENCOUNTER — Encounter: Payer: Self-pay | Admitting: Bariatrics

## 2022-03-15 ENCOUNTER — Ambulatory Visit (INDEPENDENT_AMBULATORY_CARE_PROVIDER_SITE_OTHER): Payer: Medicare HMO | Admitting: Bariatrics

## 2022-03-15 VITALS — BP 102/68 | HR 69 | Temp 98.0°F | Ht 65.0 in | Wt 160.0 lb

## 2022-03-15 DIAGNOSIS — Z6826 Body mass index (BMI) 26.0-26.9, adult: Secondary | ICD-10-CM

## 2022-03-15 DIAGNOSIS — E669 Obesity, unspecified: Secondary | ICD-10-CM

## 2022-03-15 DIAGNOSIS — R7303 Prediabetes: Secondary | ICD-10-CM

## 2022-03-15 DIAGNOSIS — E039 Hypothyroidism, unspecified: Secondary | ICD-10-CM

## 2022-03-21 ENCOUNTER — Ambulatory Visit: Payer: Self-pay | Admitting: Licensed Clinical Social Worker

## 2022-03-21 DIAGNOSIS — H04123 Dry eye syndrome of bilateral lacrimal glands: Secondary | ICD-10-CM | POA: Diagnosis not present

## 2022-03-21 DIAGNOSIS — H18453 Nodular corneal degeneration, bilateral: Secondary | ICD-10-CM | POA: Diagnosis not present

## 2022-03-21 NOTE — Patient Outreach (Unsigned)
  Care Coordination   Initial Visit Note   03/21/2022 Name: [name] MRN: [id number] DOB: 1945/12/16  [name] is a 76 y.o. year old female who sees Larose Kells, Alda Berthold, MD for primary care. I spoke with  [name] 's husband by phone today.  What matters to the patients health and wellness today?    Patient's husband scheduled phone appointment to obtain additional information about the Care Coordination program..     SDOH assessments and interventions completed:  No{THN Tip this will not be part of the note when signed-REQUIRED REPORT FIELD DO NOT DELETE (Optional):27901}   Care Coordination Interventions:  No, not indicated {THN Tip this will not be part of the note when signed-REQUIRED REPORT FIELD DO NOT DELETE (Optional):27901}  Follow up plan: Follow up call scheduled for 03/22/22    Encounter Outcome:  Pt. Scheduled {THN Tip this will not be part of the note when signed-REQUIRED REPORT FIELD DO NOT DELETE (Optional):27901}  Casimer Lanius, [place] [phone]

## 2022-03-22 ENCOUNTER — Ambulatory Visit: Payer: Self-pay | Admitting: Licensed Clinical Social Worker

## 2022-03-22 NOTE — Patient Outreach (Signed)
  Care Coordination  Initial Visit Note   03/22/2022 Name: GYSELLE MATTHEW MRN: [id number] DOB: 11-Dec-1945  MARCHELE DECOCK is a 76 y.o. year old female who sees Larose Kells, Alda Berthold, MD for primary care. I spoke with  [name] by phone today.  What matters to the patients health and wellness today?   Patient reports no concerns or needs from Care Coordination team with health and wellness related to physical or mental heath. .    Goals Addressed             This Visit's Progress    COMPLETED: Care Coordination Activites No Follow up Required       Care Coordination Interventions: Reviewed Care Coordination Services: Declined Medicare Annual Wellness Visit: Completed Concerns with obtaining required medications: No Assessed Social Determinants of Health Solution-Focused Strategies employed:  Active listening / Reflection utilized  Quality of sleep assessed & Sleep Hygiene techniques promoted  Provided EMMI education information on :Sleep Hygiene            SDOH assessments and interventions completed:  Yes  SDOH Interventions Today    Flowsheet Row Most Recent Value  SDOH Interventions   Food Insecurity Interventions Intervention Not Indicated  Housing Interventions Intervention Not Indicated  Transportation Interventions Intervention Not Indicated  Utilities Interventions Intervention Not Indicated  Stress Interventions Intervention Not Indicated        Care Coordination Interventions:  Yes, provided   Follow up plan: No further intervention required.   Encounter Outcome:  Pt. Visit Completed   Casimer Lanius, [place] [phone]

## 2022-03-22 NOTE — Patient Instructions (Signed)
Visit Information  Thank you for taking time to visit with me today. Please don't hesitate to contact me if I can be of assistance to you.   Following are the goals we discussed today:   Goals Addressed             This Visit's Progress    COMPLETED: Care Coordination Activites No Follow up Required       Care Coordination Interventions: Reviewed Care Coordination Services: Declined Medicare Annual Wellness Visit: Completed Concerns with obtaining required medications: No Assessed Social Determinants of Health Solution-Focused Strategies employed:  Active listening / Reflection utilized  Quality of sleep assessed & Sleep Hygiene techniques promoted  Provided EMMI education information on :Sleep Hygiene            Please call the care guide team at [phone] if you need to cancel or reschedule your appointment.    Patient verbalizes understanding of instructions and care plan provided today and agrees to view in [place]. Active MyChart status and patient understanding of how to access instructions and care plan via MyChart confirmed with patient.     No further follow up required: By Care Coordination at this time   Care Coordination provides support specific to your health needs that extend beyond exceptional routine office care you already receive from your primary care doctor.    If you are eligible for standard Care Coordination, there is no cost to you.  The Care Coordination team is made up of the following team members: Registered Nurse Care Guide: disease management, health education, care coordination and complex case management Clinical Social Work: Complex Care Coordination including coordination of level of care needs, mental and behavioral health assessment and recommendations, and connection to long-term mental health support Clinical Pharmacist: medication management, assistance and disease management Community Resource Care Guides: Radio producer Team: dedicated team of scheduling professionals to support patient and clinical team scheduling needs  Please call [phone] if you would like to schedule a phone appointment with one of the team members.    Casimer Lanius, [place] [phone]

## 2022-03-29 ENCOUNTER — Encounter: Payer: Self-pay | Admitting: Bariatrics

## 2022-03-29 ENCOUNTER — Telehealth: Payer: Self-pay | Admitting: Internal Medicine

## 2022-03-29 ENCOUNTER — Other Ambulatory Visit (HOSPITAL_BASED_OUTPATIENT_CLINIC_OR_DEPARTMENT_OTHER): Payer: Self-pay

## 2022-03-29 DIAGNOSIS — H18413 Arcus senilis, bilateral: Secondary | ICD-10-CM | POA: Diagnosis not present

## 2022-03-29 DIAGNOSIS — Z961 Presence of intraocular lens: Secondary | ICD-10-CM | POA: Diagnosis not present

## 2022-03-29 DIAGNOSIS — I10 Essential (primary) hypertension: Secondary | ICD-10-CM | POA: Diagnosis not present

## 2022-03-29 DIAGNOSIS — H18453 Nodular corneal degeneration, bilateral: Secondary | ICD-10-CM | POA: Diagnosis not present

## 2022-03-29 MED ORDER — HYDROCODONE-ACETAMINOPHEN 7.5-325 MG PO TABS
1.0000 | ORAL_TABLET | Freq: Four times a day (QID) | ORAL | 0 refills | Status: DC | PRN
  Filled 2022-03-29: qty 120, 30d supply, fill #0

## 2022-03-29 NOTE — Telephone Encounter (Signed)
PDMP okay, Rx sent 

## 2022-03-29 NOTE — Telephone Encounter (Signed)
Requesting: hydrocodone 7.5-'325mg'$   Contract:11/10/21 UDS:11/10/21 Last Visit: 11/10/21 Next Visit: 04/13/22 Last Refill: 03/01/22 #120 and 0RF   Please Advise

## 2022-03-29 NOTE — Progress Notes (Signed)
Chief Complaint:   OBESITY [name] is here to discuss her progress with her obesity treatment plan along with follow-up of her obesity related diagnoses. [name] is on keeping a food journal and adhering to recommended goals of 1200 calories and 80 grams of protein and the [place] and states she is following her eating plan approximately 80% of the time. [name] states she is doing 0 minutes 0 times per week.  Today's visit was #: 6 Starting weight: 184 lbs Starting date: 11/23/2021 Today's weight: 160 lbs Today's date: 03/15/2022 Total lbs lost to date: 24 Total lbs lost since last in-office visit: 4  Interim History: [name] is down 4 pounds since her last visit, but she is doing well overall.  She is getting adequate protein.  Subjective:   1. Prediabetes [name] is not on medications currently.  2. Hypothyroidism, unspecified type [name] is taking Synthroid as directed.  Assessment/Plan:   1. Prediabetes [name] will continue working on minimizing all starches and sweets.  2. Hypothyroidism, unspecified type [name] will continue Synthroid as directed.  3. Obesity, current BMI 26.7 [name] is currently in the action stage of change. As such, her goal is to continue with weight loss efforts. She has agreed to keeping a food journal and adhering to recommended goals of 1200 calories and 80 grams of protein.   She will adhere to the plan 80-90%.  Keep her protein intake high.  She will use her air [name].  Protein shake handout was given.  Exercise goals: No exercise has been prescribed at this time.  Behavioral modification strategies: increasing lean protein intake, decreasing simple carbohydrates, increasing vegetables, increasing water intake, decreasing eating out, no skipping meals, meal planning and cooking strategies, keeping healthy foods in the home, and planning for success.  [name] has agreed to follow-up with our clinic in 3 to 4 weeks. She was  informed of the importance of frequent follow-up visits to maximize her success with intensive lifestyle modifications for her multiple health conditions.   Objective:   Blood pressure 102/68, pulse 69, temperature 98 F (36.7 C), height '5\' 5"'$  (1.651 m), weight 160 lb (72.6 kg), last menstrual period 04/10/1998, SpO2 100 %. Body mass index is 26.63 kg/m.  General: Cooperative, alert, well developed, in no acute distress. HEENT: Conjunctivae and lids unremarkable. Cardiovascular: Regular rhythm.  Lungs: Normal work of breathing. Neurologic: No focal deficits.   Lab Results  Component Value Date   CREATININE 0.82 11/10/2021   BUN 14 11/10/2021   NA 141 11/10/2021   K 3.5 11/10/2021   CL 101 11/10/2021   CO2 32 11/10/2021   Lab Results  Component Value Date   ALT 11 07/26/2021   AST 14 07/26/2021   ALKPHOS 78 10/18/2020   BILITOT 0.6 10/18/2020   Lab Results  Component Value Date   HGBA1C 5.7 (H) 11/23/2021   HGBA1C 5.8 03/24/2021   HGBA1C 5.5 05/22/2015   Lab Results  Component Value Date   INSULIN 8.7 11/23/2021   Lab Results  Component Value Date   TSH 1.97 11/10/2021   Lab Results  Component Value Date   CHOL 154 07/26/2021   HDL 54.30 07/26/2021   LDLCALC 71 07/26/2021   LDLDIRECT 92.0 03/04/2007   TRIG 143.0 07/26/2021   CHOLHDL 3 07/26/2021   Lab Results  Component Value Date   VD25OH 47.1 11/23/2021   VD25OH 44.87 10/21/2021   VD25OH 46.9 10/05/2020   Lab Results  Component Value Date   WBC 6.8  11/10/2021   HGB 13.7 11/10/2021   HCT 40.7 11/10/2021   MCV 91.0 11/10/2021   PLT 281.0 11/10/2021   No results found for: "IRON", "TIBC", "FERRITIN"  Attestation Statements:   Reviewed by clinician on day of visit: allergies, medications, problem list, medical history, surgical history, family history, social history, and previous encounter notes.   Wilhemena Durie, am acting as Location manager for [employer], DO.  I have reviewed the above  documentation for accuracy and completeness, and I agree with the above. Jearld Lesch, DO

## 2022-04-04 NOTE — Telephone Encounter (Signed)
Prolia VOB initiated via [email or web address]  Last Prolia inj 10/12/21 Next Prolia inj due 04/15/21   Scheduled 04/25/22

## 2022-04-05 ENCOUNTER — Encounter (HOSPITAL_BASED_OUTPATIENT_CLINIC_OR_DEPARTMENT_OTHER): Payer: Self-pay

## 2022-04-05 ENCOUNTER — Ambulatory Visit (HOSPITAL_BASED_OUTPATIENT_CLINIC_OR_DEPARTMENT_OTHER)
Admission: RE | Admit: 2022-04-05 | Discharge: 2022-04-05 | Disposition: A | Discharge status: Home / Self Care | Payer: Medicare HMO | Source: Ambulatory Visit | Attending: Internal Medicine | Admitting: Internal Medicine

## 2022-04-05 DIAGNOSIS — Z1231 Encounter for screening mammogram for malignant neoplasm of breast: Secondary | ICD-10-CM | POA: Diagnosis not present

## 2022-04-06 ENCOUNTER — Telehealth: Payer: Self-pay | Admitting: Internal Medicine

## 2022-04-07 ENCOUNTER — Ambulatory Visit: Payer: Medicare HMO | Admitting: Physical Medicine & Rehabilitation

## 2022-04-07 MED ORDER — FLUTICASONE-SALMETEROL 100-50 MCG/ACT IN AEPB: 1.0000 | INHALATION_SPRAY | Freq: Two times a day (BID) | RESPIRATORY_TRACT | 5 refills | Status: DC

## 2022-04-07 NOTE — Telephone Encounter (Signed)
PA initiated via Covermymeds; KEY: [id number]. Awaiting determination.

## 2022-04-07 NOTE — Telephone Encounter (Signed)
PA approved. Effective 04/10/21 to 04/09/22.

## 2022-04-13 ENCOUNTER — Telehealth: Payer: Self-pay

## 2022-04-13 ENCOUNTER — Encounter: Payer: Self-pay | Admitting: Internal Medicine

## 2022-04-13 ENCOUNTER — Ambulatory Visit (INDEPENDENT_AMBULATORY_CARE_PROVIDER_SITE_OTHER): Payer: Medicare HMO | Admitting: Internal Medicine

## 2022-04-13 VITALS — BP 138/82 | HR 68 | Temp 97.8°F | Resp 16 | Ht 65.0 in | Wt 162.1 lb

## 2022-04-13 DIAGNOSIS — Z122 Encounter for screening for malignant neoplasm of respiratory organs: Secondary | ICD-10-CM | POA: Diagnosis not present

## 2022-04-13 DIAGNOSIS — E039 Hypothyroidism, unspecified: Secondary | ICD-10-CM

## 2022-04-13 DIAGNOSIS — R490 Dysphonia: Secondary | ICD-10-CM

## 2022-04-13 DIAGNOSIS — J438 Other emphysema: Secondary | ICD-10-CM

## 2022-04-13 DIAGNOSIS — Z87891 Personal history of nicotine dependence: Secondary | ICD-10-CM | POA: Diagnosis not present

## 2022-04-13 DIAGNOSIS — K5909 Other constipation: Secondary | ICD-10-CM | POA: Diagnosis not present

## 2022-04-13 LAB — TSH: TSH: 0.58 u[IU]/mL (ref 0.35–5.50)

## 2022-04-13 MED ORDER — LINACLOTIDE 72 MCG PO CAPS: 72.0000 ug | ORAL_CAPSULE | Freq: Every day | ORAL | 2 refills | Status: DC

## 2022-04-13 NOTE — Assessment & Plan Note (Signed)
Hypothyroidism: Well-controlled, continue Synthroid, patient request TSH.  Will do Hyperlipidemia: Last FLP satisfactory.  Continue pravastatin. GERD: Currently well-controlled with Prilosec. Insomnia: Still has occasional difficulty with sleep, she is already taking hydrocodone and Xanax, advised again introducing  another medication for insomnia, she agreed. D/w pt  good sleep habits, rec melatonin daily. Pain management: Since she had a right hip injection last year she is walking better.  Back pain about the same, continue hydrocodone. Constipation: Chronic issue, possibly due to pain medication, already doing MiraLAX and fiber.  Would like to trial with Linzess, I think is appropriate, prescription sent.  See AVS COPD: Will run a Advair PA if needed Lung cancer screening: Previously test was not covered, pt still very interested, will try again. Hoarseness: Former smoker, has hoarseness on and off, GERD well-controlled at this time with Prilosec.  ENT referral. Vaccine advice provided RTC 4 months

## 2022-04-13 NOTE — Progress Notes (Signed)
Subjective:    Patient ID: [name], female    DOB: 09-16-1945, 77 y.o.   MRN: [id number]  DOS:  04/13/2022 Type of visit - description: f/u  Routine visit, has multiple issues to discuss: Insomnia Hoarseness Constipation Lung cancer screening Vaccines  Denies runny nose itchy eyes or sneezing No chest pain no difficulty breathing C/o constipation but no nausea vomiting or blood in the stools. Denies cough, sputum production or hemoptysis   Wt Readings from Last 3 Encounters:  04/13/22 162 lb 2 oz (73.5 kg)  03/15/22 160 lb (72.6 kg)  03/07/22 168 lb (76.2 kg)    Review of Systems See above   Past Medical History:  Diagnosis Date   Arthritis    Back pain    Back pain    Cataract    both eyes rt. 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   Fibromyalgia    see rheumatology   GERD (gastroesophageal reflux disease)    PAST HX    High blood pressure    High cholesterol    Hyperlipidemia    Hypothyroidism    Joint pain    Migraine    Ocular rosacea 03/2009   on doxy   Osteoporosis    osteopenia   Pleuritic chest pain    etiology unclear, has beeb eval by pulmonary and rheumatology (autoimmune dz?)   Swelling of lower extremity    Wrist fracture 2018    Past Surgical History:  Procedure Laterality Date   ANTERIOR LAT LUMBAR FUSION N/A 12/03/2019   Procedure: ANTERIOR LATERAL LUMBAR FUSION L2-4;  Surgeon: Melina Schools, MD;  Location: [facility];  Service: Orthopedics;  Laterality: N/A;  4 hrs   COLONOSCOPY     COSMETIC SURGERY  04/12/2018   L upper eyelid ptosis repair, and trichophytic brow lift   ESOPHAGOGASTRODUODENOSCOPY (EGD) WITH PROPOFOL N/A 03/27/2013   Procedure: ESOPHAGOGASTRODUODENOSCOPY (EGD) WITH PROPOFOL;  Surgeon: Milus Banister, MD;   Location: [facility];  Service: Endoscopy;  Laterality: N/A;  possible dil   EYE SURGERY     growth on cornea-bil.   plastic surgery face  2013   POLYPECTOMY     right rotator cuff repair  2010   TONSILLECTOMY     UPPER GASTROINTESTINAL ENDOSCOPY      Current Outpatient Medications  Medication Instructions   ALPRAZolam (XANAX) 0.5 MG tablet TAKE 1 TABLET BY MOUTH AT BEDTIME AS NEEDED FOR ANXIETY   aspirin EC 81 mg, Oral, Daily, Swallow whole.   calcium carbonate (OS-CAL) 1250 (500 CA) MG chewable tablet 1 tablet, Oral, 2 times daily   cycloSPORINE (RESTASIS) 0.05 % ophthalmic emulsion 1 drop, 2 times daily   denosumab (PROLIA) 60 mg, Subcutaneous, Every 6 months   Estradiol (YUVAFEM) 10 MCG TABS vaginal tablet PLACE 1 TABLET VAGINALLY 2 TIMES A WEEK.   fluticasone-salmeterol (ADVAIR) 100-50 MCG/ACT AEPB 1 puff, Inhalation, 2 times daily   hydrochlorothiazide (MICROZIDE) 12.5 MG capsule Oral, Daily   HYDROcodone-acetaminophen (NORCO) 7.5-325 MG tablet 1 tablet, Oral, Every 6 hours PRN   HYDROcodone-acetaminophen (NORCO) 7.5-325 MG tablet 1 tablet, Oral, Every 6 hours PRN   hydroxypropyl methylcellulose / hypromellose (ISOPTO TEARS / GONIOVISC) 2.5 % ophthalmic solution 1 drop, Both Eyes, 3  times daily PRN   levothyroxine (SYNTHROID) 88 MCG tablet TAKE 1 TABLET BY MOUTH EVERY DAY BEFORE BREAKFAST   linaclotide (LINZESS) 72 mcg, Oral, Daily before breakfast   Magnesium 300 MG CAPS Oral   Multiple Vitamin (MULTIVITAMIN) capsule 1 capsule, Oral, Daily   naloxone (NARCAN) 4 MG/0.1ML LIQD nasal spray kit Use as directed   omeprazole (PRILOSEC) 40 MG capsule One pill 20-23 min before breakfast meal daily.   pravastatin (PRAVACHOL) 20 MG tablet TAKE 1 TABLET BY MOUTH EVERYDAY AT BEDTIME   Vitamin D3 2,000 Units, Oral, Daily   vitamin E 1000 UNIT capsule vitamin E       Objective:   Physical Exam BP 138/82   Pulse 68   Temp 97.8 F (36.6 C) (Oral)   Resp 16   Ht _0  (1.651 m)    Wt 162 lb 2 oz (73.5 kg)   LMP 04/10/1998 (Approximate)   SpO2 98%   BMI 26.98 kg/m  General:   Well developed, NAD, BMI noted.  HEENT:  Normocephalic . Face symmetric, atraumatic Neck: No thyromegaly, no lymphadenopathies at the neck or supraclavicular areas Lungs:  CTA B Normal respiratory effort, no intercostal retractions, no accessory muscle use. Heart: RRR,  no murmur.  Abdomen:  Not distended, soft, non-tender. No rebound or rigidity.   Skin: Not pale. Not jaundice Lower extremities: no pretibial edema bilaterally  Neurologic:  alert & oriented X3.  Speech normal, gait appropriate for age and unassisted Psych--  Cognition and judgment appear intact.  Cooperative with normal attention span and concentration.  Behavior appropriate. No anxious or depressed appearing.     Assessment    Assessment Hypothyroidism Hyperlipidemia: Started meds 09/2019 Lower extremity edema: On HCTZ GERD Migraines insmonia-- xanax prn COPD: per chest CTs, former heavy smoker, quit 2005 [facility]- Pain mngmt  -- back pain, 2017 had a MRI, saw Dr Durward Fortes, Rx local injection, PT --h/o fibromyalgia --H/o Pleuritic chest pain, on-off, resolved  --spinal stenosis (MRI 05/2018)  Osteopenia:  Per ENDO --T score 2011   -2.6 (intolerant to fosamax, ok w/ Boniva x years) --T score 01-2013   -2.6 --T score 02-2015  -2.3, rx ca and cit D  Glaucoma Ocular Rosacea 2010 CV: LAD  artery disease  per CT scan of the chest, CP:  admitted, stress lest low risk 05-2015.  Control CV RF CAD, aortic sclerosis: Per CT  PLAN: Hypothyroidism: Well-controlled, continue Synthroid, patient request TSH.  Will do Hyperlipidemia: Last FLP satisfactory.  Continue pravastatin. GERD: Currently well-controlled with Prilosec. Insomnia: Still has occasional difficulty with sleep, she is already taking hydrocodone and Xanax, advised again introducing  another medication for insomnia, she agreed. D/w pt  good sleep habits, rec  melatonin daily. Pain management: Since she had a right hip injection last year she is walking better.  Back pain about the same, continue hydrocodone. Constipation: Chronic issue, possibly due to pain medication, already doing MiraLAX and fiber.  Would like to trial with Linzess, I think is appropriate, prescription sent.  See AVS COPD: Will run a Advair PA if needed Lung cancer screening: Previously test was not covered, pt still very interested, will try again. Hoarseness: Former smoker, has hoarseness on and off, GERD well-controlled at this time with Prilosec.  ENT referral. Vaccine advice provided RTC 4 months

## 2022-04-13 NOTE — Telephone Encounter (Signed)
PA initiated via Covermymeds; KEY: [id number]    The patient currently has access to the requested medication and a Prior Authorization is not needed for the patient/medication.

## 2022-04-13 NOTE — Patient Instructions (Addendum)
Vaccines I recommend:  RSV vaccine  Try Linzess 72 mg daily. Continue with good hydration, MiraLAX as needed, fiber supplements (always with fluids). We could go up on the dose if needed You may need to slow down MiraLAX      GO TO THE LAB : Get the blood work     [place], [place] back for a checkup in 3 months

## 2022-04-17 ENCOUNTER — Ambulatory Visit (INDEPENDENT_AMBULATORY_CARE_PROVIDER_SITE_OTHER): Payer: Medicare HMO | Admitting: Bariatrics

## 2022-04-17 VITALS — BP 104/68 | HR 65 | Temp 97.8°F | Ht 65.0 in | Wt 159.0 lb

## 2022-04-17 DIAGNOSIS — E038 Other specified hypothyroidism: Secondary | ICD-10-CM

## 2022-04-17 DIAGNOSIS — R7303 Prediabetes: Secondary | ICD-10-CM

## 2022-04-17 DIAGNOSIS — Z6826 Body mass index (BMI) 26.0-26.9, adult: Secondary | ICD-10-CM

## 2022-04-17 DIAGNOSIS — E669 Obesity, unspecified: Secondary | ICD-10-CM | POA: Diagnosis not present

## 2022-04-18 NOTE — Telephone Encounter (Signed)
Prior auth renewal required for [employer]  PA PROCESS DETAILS: Precertification is required. Call [phone] or complete the Precertification form available at [email or web address]

## 2022-04-25 ENCOUNTER — Ambulatory Visit: Payer: Medicare HMO

## 2022-04-25 VITALS — BP 116/72 | Ht 65.0 in | Wt 164.0 lb

## 2022-04-25 DIAGNOSIS — M810 Age-related osteoporosis without current pathological fracture: Secondary | ICD-10-CM | POA: Diagnosis not present

## 2022-04-25 MED ORDER — DENOSUMAB 60 MG/ML ~~LOC~~ SOSY
60.0000 mg | PREFILLED_SYRINGE | Freq: Once | SUBCUTANEOUS | Status: AC
  Administered 2022-04-25: 60 mg via SUBCUTANEOUS

## 2022-04-25 NOTE — Progress Notes (Signed)
Patient verbally confirmed name, date of birth, and correct medication to be administered. Prolia injection administered and pt tolerated well.  

## 2022-04-27 ENCOUNTER — Telehealth: Payer: Self-pay | Admitting: Internal Medicine

## 2022-04-27 ENCOUNTER — Other Ambulatory Visit (HOSPITAL_BASED_OUTPATIENT_CLINIC_OR_DEPARTMENT_OTHER): Payer: Self-pay

## 2022-04-27 MED ORDER — HYDROCODONE-ACETAMINOPHEN 7.5-325 MG PO TABS
1.0000 | ORAL_TABLET | Freq: Four times a day (QID) | ORAL | 0 refills | Status: DC | PRN
  Filled 2022-04-27 – 2022-04-28 (×2): qty 120, 30d supply, fill #0

## 2022-04-27 NOTE — Telephone Encounter (Signed)
PDMP okay, Rx sent 

## 2022-04-27 NOTE — Progress Notes (Signed)
Chief Complaint:   OBESITY [name] is here to discuss her progress with her obesity treatment plan along with follow-up of her obesity related diagnoses. [name] is on keeping a food journal and adhering to recommended goals of 1200 calories and 80 grams protein and states she is following her eating plan approximately 80% of the time. Zylpha states she is not currently exercising.  Today's visit was #: 7 Starting weight: 184 lbs Starting date: 11/23/2021 Today's weight: 159 lbs Today's date: 04/17/2022 Total lbs lost to date: 25 Total lbs lost since last in-office visit: 1  Interim History: [name] is down 1 lb since her last visit. She was out of town and had more poor choices. She is doing okay with fruits and vegetables.  Subjective:   1. Prediabetes [name] is not on medication.  2. Other specified hypothyroidism She is taking Synthroid.  Assessment/Plan:   1. Prediabetes Pt will minimize all shakes and sweets.  2. Other specified hypothyroidism Continue Synthroid as directed.  3. Obesity, current BMI 26.6 [name] is currently in the action stage of change. As such, her goal is to continue with weight loss efforts. She has agreed to keeping a food journal and adhering to recommended goals of 1200 calories and 80 grams protein.   Pt will adhere to the plan 85-95% of the time. Meal planning discussed. Keep protein high. Pt will get back on track.  Exercise goals:  As is  Behavioral modification strategies: increasing lean protein intake, decreasing simple carbohydrates, increasing vegetables, increasing water intake, decreasing eating out, no skipping meals, meal planning and cooking strategies, keeping healthy foods in the home, and planning for success.  [name] has agreed to follow-up with our clinic in 3-4 weeks. She was informed of the importance of frequent follow-up visits to maximize her success with intensive lifestyle modifications for her multiple health  conditions.   Objective:   Blood pressure 104/68, pulse 65, temperature 97.8 F (36.6 C), height '5\' 5"'$  (1.651 m), weight 159 lb (72.1 kg), last menstrual period 04/10/1998, SpO2 98 %. Body mass index is 26.46 kg/m.  General: Cooperative, alert, well developed, in no acute distress. HEENT: Conjunctivae and lids unremarkable. Cardiovascular: Regular rhythm.  Lungs: Normal work of breathing. Neurologic: No focal deficits.   Lab Results  Component Value Date   CREATININE 0.82 11/10/2021   BUN 14 11/10/2021   NA 141 11/10/2021   K 3.5 11/10/2021   CL 101 11/10/2021   CO2 32 11/10/2021   Lab Results  Component Value Date   ALT 11 07/26/2021   AST 14 07/26/2021   ALKPHOS 78 10/18/2020   BILITOT 0.6 10/18/2020   Lab Results  Component Value Date   HGBA1C 5.7 (H) 11/23/2021   HGBA1C 5.8 03/24/2021   HGBA1C 5.5 05/22/2015   Lab Results  Component Value Date   INSULIN 8.7 11/23/2021   Lab Results  Component Value Date   TSH 0.58 04/13/2022   Lab Results  Component Value Date   CHOL 154 07/26/2021   HDL 54.30 07/26/2021   LDLCALC 71 07/26/2021   LDLDIRECT 92.0 03/04/2007   TRIG 143.0 07/26/2021   CHOLHDL 3 07/26/2021   Lab Results  Component Value Date   VD25OH 47.1 11/23/2021   VD25OH 44.87 10/21/2021   VD25OH 46.9 10/05/2020   Lab Results  Component Value Date   WBC 6.8 11/10/2021   HGB 13.7 11/10/2021   HCT 40.7 11/10/2021   MCV 91.0 11/10/2021   PLT 281.0 11/10/2021  Attestation Statements:   Reviewed by clinician on day of visit: allergies, medications, problem list, medical history, surgical history, family history, social history, and previous encounter notes.  I, Kathlene November, BS, CMA, am acting as transcriptionist for [employer], DO.  I have reviewed the above documentation for accuracy and completeness, and I agree with the above. Jearld Lesch, DO

## 2022-04-27 NOTE — Telephone Encounter (Signed)
Prior Authorization initiated for [facility] via [employer] Case ID: [id number]

## 2022-04-27 NOTE — Telephone Encounter (Signed)
Requesting: hydrocodone 7.5-'325mg'$   Contract:  11/10/21 UDS: 11/10/21 Last Visit: 04/13/22 Next Visit: 11/15/22 Last Refill: 03/29/22 #120 and 0RF   Please Advise

## 2022-04-28 ENCOUNTER — Other Ambulatory Visit (HOSPITAL_BASED_OUTPATIENT_CLINIC_OR_DEPARTMENT_OTHER): Payer: Self-pay

## 2022-05-01 ENCOUNTER — Encounter: Payer: Self-pay | Admitting: Bariatrics

## 2022-05-03 NOTE — Telephone Encounter (Signed)
PA#: [id number] Valid: 04/27/22-04/28/23

## 2022-05-03 NOTE — Telephone Encounter (Signed)
Last Prolia inj 04/25/22 Next Prolia inj due 10/25/22

## 2022-05-09 DIAGNOSIS — J383 Other diseases of vocal cords: Secondary | ICD-10-CM | POA: Diagnosis not present

## 2022-05-09 DIAGNOSIS — R490 Dysphonia: Secondary | ICD-10-CM | POA: Diagnosis not present

## 2022-05-15 ENCOUNTER — Encounter: Payer: Self-pay | Admitting: Bariatrics

## 2022-05-15 ENCOUNTER — Ambulatory Visit (INDEPENDENT_AMBULATORY_CARE_PROVIDER_SITE_OTHER): Payer: Medicare HMO | Admitting: Bariatrics

## 2022-05-15 VITALS — BP 111/70 | HR 60 | Temp 97.4°F | Ht 65.0 in | Wt 154.0 lb

## 2022-05-15 DIAGNOSIS — Z6825 Body mass index (BMI) 25.0-25.9, adult: Secondary | ICD-10-CM

## 2022-05-15 DIAGNOSIS — E038 Other specified hypothyroidism: Secondary | ICD-10-CM | POA: Diagnosis not present

## 2022-05-15 DIAGNOSIS — R7303 Prediabetes: Secondary | ICD-10-CM | POA: Diagnosis not present

## 2022-05-15 DIAGNOSIS — E669 Obesity, unspecified: Secondary | ICD-10-CM | POA: Diagnosis not present

## 2022-05-17 DIAGNOSIS — M1990 Unspecified osteoarthritis, unspecified site: Secondary | ICD-10-CM | POA: Diagnosis not present

## 2022-05-17 DIAGNOSIS — M810 Age-related osteoporosis without current pathological fracture: Secondary | ICD-10-CM | POA: Diagnosis not present

## 2022-05-17 DIAGNOSIS — E039 Hypothyroidism, unspecified: Secondary | ICD-10-CM | POA: Diagnosis not present

## 2022-05-17 DIAGNOSIS — I10 Essential (primary) hypertension: Secondary | ICD-10-CM | POA: Diagnosis not present

## 2022-05-17 DIAGNOSIS — H04129 Dry eye syndrome of unspecified lacrimal gland: Secondary | ICD-10-CM | POA: Diagnosis not present

## 2022-05-17 DIAGNOSIS — M792 Neuralgia and neuritis, unspecified: Secondary | ICD-10-CM | POA: Diagnosis not present

## 2022-05-17 DIAGNOSIS — R32 Unspecified urinary incontinence: Secondary | ICD-10-CM | POA: Diagnosis not present

## 2022-05-17 DIAGNOSIS — Z79891 Long term (current) use of opiate analgesic: Secondary | ICD-10-CM | POA: Diagnosis not present

## 2022-05-17 DIAGNOSIS — K5900 Constipation, unspecified: Secondary | ICD-10-CM | POA: Diagnosis not present

## 2022-05-17 DIAGNOSIS — E785 Hyperlipidemia, unspecified: Secondary | ICD-10-CM | POA: Diagnosis not present

## 2022-05-17 DIAGNOSIS — K219 Gastro-esophageal reflux disease without esophagitis: Secondary | ICD-10-CM | POA: Diagnosis not present

## 2022-05-17 DIAGNOSIS — Z008 Encounter for other general examination: Secondary | ICD-10-CM | POA: Diagnosis not present

## 2022-05-17 DIAGNOSIS — R609 Edema, unspecified: Secondary | ICD-10-CM | POA: Diagnosis not present

## 2022-05-21 ENCOUNTER — Other Ambulatory Visit: Payer: Self-pay | Admitting: Internal Medicine

## 2022-05-22 ENCOUNTER — Other Ambulatory Visit (HOSPITAL_BASED_OUTPATIENT_CLINIC_OR_DEPARTMENT_OTHER): Payer: Self-pay

## 2022-05-22 ENCOUNTER — Encounter: Payer: Self-pay | Admitting: Internal Medicine

## 2022-05-22 NOTE — Telephone Encounter (Signed)
  Got a 30-day supply 04/28/2022, recommend to call in few days for a refill.

## 2022-05-22 NOTE — Telephone Encounter (Signed)
Requesting: hydrocodone 7.5-325mg Contract: 11/10/21 UDS: 11/10/21 Last Visit: 04/13/22 Next Visit: 07/14/22 Last Refill: 04/27/22 #120 and 0RF    Please Advise

## 2022-05-27 NOTE — Progress Notes (Unsigned)
Chief Complaint:   OBESITY [name] is here to discuss her progress with her obesity treatment plan along with follow-up of her obesity related diagnoses. [name] is on keeping a food journal and adhering to recommended goals of 1200 calories and 80 grams protein and states she is following her eating plan approximately 75% of the time. [name] states she is not currently exercising.  Today's visit was #: 8 Starting weight: 184 lbs Starting date: 11/23/2021 Today's weight: 154 lbs Today's date: 05/15/2022 Total lbs lost to date: 30 Total lbs lost since last in-office visit: 5  Interim History: [name] is down another 5 lbs since her last visit. She was getting more protein. Her goal is about 145 lbs.  Subjective:   1. Other specified hypothyroidism [name] is taking levothyroxine.  2. Prediabetes She is not on medication.  Assessment/Plan:   1. Other specified hypothyroidism Continue levothyroxine.  2. Prediabetes Will keep all carbohydrates low (sweets and starches).  3. Generalized obesity 4. BMI 25.0-25.9,adult Will adhere closely to the plan 80-90% of the time. Meal planning Increase water intake Seasoning handout given  [name] is currently in the action stage of change. As such, her goal is to continue with weight loss efforts. She has agreed to keeping a food journal and adhering to recommended goals of 1200 calories and 80 grams protein.   Exercise goals:  increase activity.  Behavioral modification strategies: increasing lean protein intake, decreasing simple carbohydrates, increasing vegetables, increasing water intake, decreasing eating out, no skipping meals, meal planning and cooking strategies, keeping healthy foods in the home, and planning for success.  [name] has agreed to follow-up with our clinic in 4 weeks. She was informed of the importance of frequent follow-up visits to maximize her success with intensive lifestyle modifications for her multiple  health conditions.   Objective:   Blood pressure 111/70, pulse 60, temperature (!) 97.4 F (36.3 C), height 5' 5"$  (1.651 m), weight 154 lb (69.9 kg), last menstrual period 04/10/1998, SpO2 99 %. Body mass index is 25.63 kg/m.  General: Cooperative, alert, well developed, in no acute distress. HEENT: Conjunctivae and lids unremarkable. Cardiovascular: Regular rhythm.  Lungs: Normal work of breathing. Neurologic: No focal deficits.   Lab Results  Component Value Date   CREATININE 0.82 11/10/2021   BUN 14 11/10/2021   NA 141 11/10/2021   K 3.5 11/10/2021   CL 101 11/10/2021   CO2 32 11/10/2021   Lab Results  Component Value Date   ALT 11 07/26/2021   AST 14 07/26/2021   ALKPHOS 78 10/18/2020   BILITOT 0.6 10/18/2020   Lab Results  Component Value Date   HGBA1C 5.7 (H) 11/23/2021   HGBA1C 5.8 03/24/2021   HGBA1C 5.5 05/22/2015   Lab Results  Component Value Date   INSULIN 8.7 11/23/2021   Lab Results  Component Value Date   TSH 0.58 04/13/2022   Lab Results  Component Value Date   CHOL 154 07/26/2021   HDL 54.30 07/26/2021   LDLCALC 71 07/26/2021   LDLDIRECT 92.0 03/04/2007   TRIG 143.0 07/26/2021   CHOLHDL 3 07/26/2021   Lab Results  Component Value Date   VD25OH 47.1 11/23/2021   VD25OH 44.87 10/21/2021   VD25OH 46.9 10/05/2020   Lab Results  Component Value Date   WBC 6.8 11/10/2021   HGB 13.7 11/10/2021   HCT 40.7 11/10/2021   MCV 91.0 11/10/2021   PLT 281.0 11/10/2021   Attestation Statements:   Reviewed by clinician on day  of visit: allergies, medications, problem list, medical history, surgical history, family history, social history, and previous encounter notes.  I, Kathlene November, BS, CMA, am acting as transcriptionist for [employer], DO.  I have reviewed the above documentation for accuracy and completeness, and I agree with the above. Jearld Lesch, DO

## 2022-05-28 ENCOUNTER — Telehealth: Payer: Self-pay | Admitting: Internal Medicine

## 2022-05-29 ENCOUNTER — Other Ambulatory Visit (HOSPITAL_BASED_OUTPATIENT_CLINIC_OR_DEPARTMENT_OTHER): Payer: Self-pay

## 2022-05-29 MED ORDER — HYDROCODONE-ACETAMINOPHEN 7.5-325 MG PO TABS
1.0000 | ORAL_TABLET | Freq: Four times a day (QID) | ORAL | 0 refills | Status: DC | PRN
  Filled 2022-05-29: qty 120, 30d supply, fill #0

## 2022-05-29 NOTE — Telephone Encounter (Signed)
PDMP okay, Rx sent 

## 2022-05-29 NOTE — Telephone Encounter (Signed)
Requesting: hydrocodone 7.5-325mg Contract: 11/10/21 UDS: 11/10/21 Last Visit: 04/13/22 Next Visit: 07/14/22 Last Refill: 04/27/22 #120 and 0RF    Please Advise

## 2022-05-30 ENCOUNTER — Encounter: Payer: Self-pay | Admitting: Bariatrics

## 2022-06-12 ENCOUNTER — Encounter: Payer: Self-pay | Admitting: Bariatrics

## 2022-06-12 ENCOUNTER — Ambulatory Visit (INDEPENDENT_AMBULATORY_CARE_PROVIDER_SITE_OTHER): Payer: Medicare HMO | Admitting: Bariatrics

## 2022-06-12 VITALS — BP 94/61 | HR 68 | Temp 97.7°F | Ht 65.0 in | Wt 153.0 lb

## 2022-06-12 DIAGNOSIS — E669 Obesity, unspecified: Secondary | ICD-10-CM | POA: Diagnosis not present

## 2022-06-12 DIAGNOSIS — R7303 Prediabetes: Secondary | ICD-10-CM

## 2022-06-12 DIAGNOSIS — Z6825 Body mass index (BMI) 25.0-25.9, adult: Secondary | ICD-10-CM | POA: Diagnosis not present

## 2022-06-17 ENCOUNTER — Telehealth: Payer: Self-pay | Admitting: Internal Medicine

## 2022-06-19 NOTE — Telephone Encounter (Signed)
PDMP okay, Rx sent 

## 2022-06-19 NOTE — Telephone Encounter (Signed)
Requesting: alprazolam 0.'5mg'$   Contract: 11/10/21 UDS: 11/10/21 Last Visit: 04/13/22 Next Visit: 07/14/22 Last Refill: 02/16/22 #30 and 3RF   Please Advise

## 2022-06-21 NOTE — Progress Notes (Unsigned)
Chief Complaint:   OBESITY [name] is here to discuss her progress with her obesity treatment plan along with follow-up of her obesity related diagnoses. [name] is on keeping a food journal with goal of 1200 calories and 80 grams of protein daily and states she is following her eating plan approximately 50% of the time. [name] states she is walking the dog for 5-10 minutes 5 times per week.  Today's visit was #: 9 Starting weight: 184 lbs Starting date: 11/23/21 Today's weight: 153 lbs Today's date: 06/12/22 Total lbs lost to date: 31 Total lbs lost since last in-office visit: -1  Interim History: She is down 1 additional pound since her last visit.  She "fell off the wagon".  She has been doing some "stress eating".  Subjective:   1. Prediabetes No medications.  Assessment/Plan:   1. Prediabetes 1.  Will minimize all carbohydrates (sweets and starches).  2. Generalized obesity BMI 25.0-25.9,adult 1.  Meal planning. 2.  Will adhere closely to the meal plan. 3.  Increase protein (grilling).  [name] is currently in the action stage of change. As such, her goal is to continue with weight loss efforts. She has agreed to keeping a food journal with goal of 1200 calories and 80 grams of protein daily.  Exercise goals: Walk the dog and go back to swimming and stretching.  Behavioral modification strategies: increasing lean protein intake, decreasing simple carbohydrates, increasing vegetables, increasing water intake, decreasing eating out, no skipping meals, meal planning and cooking strategies, keeping healthy foods in the home, and planning for success.  [name] has agreed to follow-up with our clinic in 4 weeks. She was informed of the importance of frequent follow-up visits to maximize her success with intensive lifestyle modifications for her multiple health conditions.   Objective:   Blood pressure 94/61, pulse 68, temperature 97.7 F (36.5 C), height '5\' 5"'$  (1.651  m), weight 153 lb (69.4 kg), last menstrual period 04/10/1998, SpO2 99 %. Body mass index is 25.46 kg/m.  General: Cooperative, alert, well developed, in no acute distress. HEENT: Conjunctivae and lids unremarkable. Cardiovascular: Regular rhythm.  Lungs: Normal work of breathing. Neurologic: No focal deficits.   Lab Results  Component Value Date   CREATININE 0.82 11/10/2021   BUN 14 11/10/2021   NA 141 11/10/2021   K 3.5 11/10/2021   CL 101 11/10/2021   CO2 32 11/10/2021   Lab Results  Component Value Date   ALT 11 07/26/2021   AST 14 07/26/2021   ALKPHOS 78 10/18/2020   BILITOT 0.6 10/18/2020   Lab Results  Component Value Date   HGBA1C 5.7 (H) 11/23/2021   HGBA1C 5.8 03/24/2021   HGBA1C 5.5 05/22/2015   Lab Results  Component Value Date   INSULIN 8.7 11/23/2021   Lab Results  Component Value Date   TSH 0.58 04/13/2022   Lab Results  Component Value Date   CHOL 154 07/26/2021   HDL 54.30 07/26/2021   LDLCALC 71 07/26/2021   LDLDIRECT 92.0 03/04/2007   TRIG 143.0 07/26/2021   CHOLHDL 3 07/26/2021   Lab Results  Component Value Date   VD25OH 47.1 11/23/2021   VD25OH 44.87 10/21/2021   VD25OH 46.9 10/05/2020   Lab Results  Component Value Date   WBC 6.8 11/10/2021   HGB 13.7 11/10/2021   HCT 40.7 11/10/2021   MCV 91.0 11/10/2021   PLT 281.0 11/10/2021   No results found for: "IRON", "TIBC", "FERRITIN"  Attestation Statements:   Reviewed by clinician  on day of visit: allergies, medications, problem list, medical history, surgical history, family history, social history, and previous encounter notes.  I, Dawn Whitmire, FNP-C, am acting as transcriptionist for Dr. Jearld Lesch.  I have reviewed the above documentation for accuracy and completeness, and I agree with the above. Jearld Lesch, DO

## 2022-06-22 ENCOUNTER — Encounter: Payer: Self-pay | Admitting: Bariatrics

## 2022-06-25 ENCOUNTER — Telehealth: Payer: Self-pay | Admitting: Internal Medicine

## 2022-06-26 ENCOUNTER — Other Ambulatory Visit (HOSPITAL_BASED_OUTPATIENT_CLINIC_OR_DEPARTMENT_OTHER): Payer: Self-pay

## 2022-06-26 MED ORDER — HYDROCODONE-ACETAMINOPHEN 7.5-325 MG PO TABS
1.0000 | ORAL_TABLET | Freq: Four times a day (QID) | ORAL | 0 refills | Status: DC | PRN
  Filled 2022-06-26: qty 120, 30d supply, fill #0

## 2022-06-26 NOTE — Telephone Encounter (Signed)
Requesting: hydrocodone 7.5-325mg   Contract: 11/10/21 UDS: 11/10/21 Last Visit: 05/15/22 Next Visit: 07/14/22 Last Refill: 05/29/22 #120 and 0RF   Please Advise

## 2022-06-26 NOTE — Telephone Encounter (Signed)
PDMP okay, Rx sent 

## 2022-07-14 ENCOUNTER — Ambulatory Visit (INDEPENDENT_AMBULATORY_CARE_PROVIDER_SITE_OTHER): Payer: Medicare HMO | Admitting: Internal Medicine

## 2022-07-14 ENCOUNTER — Encounter: Payer: Self-pay | Admitting: Internal Medicine

## 2022-07-14 VITALS — BP 122/68 | HR 68 | Temp 98.2°F | Resp 18 | Ht 65.0 in | Wt 152.4 lb

## 2022-07-14 DIAGNOSIS — M549 Dorsalgia, unspecified: Secondary | ICD-10-CM

## 2022-07-14 DIAGNOSIS — G8929 Other chronic pain: Secondary | ICD-10-CM

## 2022-07-14 DIAGNOSIS — E038 Other specified hypothyroidism: Secondary | ICD-10-CM | POA: Diagnosis not present

## 2022-07-14 DIAGNOSIS — R609 Edema, unspecified: Secondary | ICD-10-CM

## 2022-07-14 DIAGNOSIS — E7849 Other hyperlipidemia: Secondary | ICD-10-CM

## 2022-07-14 DIAGNOSIS — E669 Obesity, unspecified: Secondary | ICD-10-CM

## 2022-07-14 LAB — BASIC METABOLIC PANEL
BUN: 13 mg/dL (ref 6–23)
CO2: 32 mEq/L (ref 19–32)
Calcium: 9.2 mg/dL (ref 8.4–10.5)
Chloride: 100 mEq/L (ref 96–112)
Creatinine, Ser: 0.8 mg/dL (ref 0.40–1.20)
GFR: 71.59 mL/min (ref >60.00)
Glucose, Bld: 85 mg/dL (ref 70–99)
Potassium: 3.1 mEq/L — ABNORMAL LOW (ref 3.5–5.1)
Sodium: 141 mEq/L (ref 135–145)

## 2022-07-14 LAB — ALT: ALT: 11 U/L (ref 0–35)

## 2022-07-14 LAB — TSH: TSH: 0.36 u[IU]/mL (ref 0.35–5.50)

## 2022-07-14 LAB — AST: AST: 14 U/L (ref 0–37)

## 2022-07-14 LAB — LIPID PANEL
Cholesterol: 117 mg/dL (ref 0–200)
HDL: 46.6 mg/dL (ref >39.00)
LDL Cholesterol: 55 mg/dL (ref 0–99)
NonHDL: 70.78
Total CHOL/HDL Ratio: 3
Triglycerides: 79 mg/dL (ref 0.0–149.0)
VLDL: 15.8 mg/dL (ref 0.0–40.0)

## 2022-07-14 MED ORDER — HYDROCODONE-ACETAMINOPHEN 7.5-325 MG PO TABS: 1.0000 | ORAL_TABLET | Freq: Three times a day (TID) | ORAL | 0 refills | Status: DC | PRN

## 2022-07-14 NOTE — Progress Notes (Unsigned)
Subjective:    Patient ID: [name], female    DOB: 03/06/1946, 77 y.o.   MRN: [id number]  DOS:  07/14/2022 Type of visit - description: f/u  Routine follow-up. Chronic medical problems were addressed. + Weight loss, working with the wellness clinic. Currently taking hydrocodone ~ 3.5 tablets a day. Main pain issue at this point is still hip pain.  Wt Readings from Last 3 Encounters:  07/14/22 152 lb 6 oz (69.1 kg)  06/12/22 153 lb (69.4 kg)  05/15/22 154 lb (69.9 kg)   Review of Systems See above   Past Medical History:  Diagnosis Date   Arthritis    Back pain    Back pain    Cataract    both eyes rt. 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   Fibromyalgia    see rheumatology   GERD (gastroesophageal reflux disease)    PAST HX    High blood pressure    High cholesterol    Hyperlipidemia    Hypothyroidism    Joint pain    Migraine    Ocular rosacea 03/2009   on doxy   Osteoporosis    osteopenia   Pleuritic chest pain    etiology unclear, has beeb eval by pulmonary and rheumatology (autoimmune dz?)   Swelling of lower extremity    Wrist fracture 2018    Past Surgical History:  Procedure Laterality Date   ANTERIOR LAT LUMBAR FUSION N/A 12/03/2019   Procedure: ANTERIOR LATERAL LUMBAR FUSION L2-4;  Surgeon: Venita Lick, MD;  Location: [facility] OR;  Service: Orthopedics;  Laterality: N/A;  4 hrs   COLONOSCOPY     COSMETIC SURGERY  04/12/2018   L upper eyelid ptosis repair, and trichophytic brow lift   ESOPHAGOGASTRODUODENOSCOPY (EGD) WITH PROPOFOL N/A 03/27/2013   Procedure: ESOPHAGOGASTRODUODENOSCOPY (EGD) WITH PROPOFOL;  Surgeon: Rachael Fee, MD;  Location: [facility];  Service: Endoscopy;  Laterality: N/A;  possible dil   EYE SURGERY     growth on  cornea-bil.   plastic surgery face  2013   POLYPECTOMY     right rotator cuff repair  2010   TONSILLECTOMY     UPPER GASTROINTESTINAL ENDOSCOPY      Current Outpatient Medications  Medication Instructions   ALPRAZolam (XANAX) 0.5 MG tablet TAKE 1 TABLET BY MOUTH AT BEDTIME AS NEEDED FOR ANXIETY   aspirin EC 81 mg, Oral, Daily, Swallow whole.   calcium carbonate (OS-CAL) 1250 (500 CA) MG chewable tablet 1 tablet, Oral, 2 times daily   cycloSPORINE (RESTASIS) 0.05 % ophthalmic emulsion 1 drop, 2 times daily   denosumab (PROLIA) 60 mg, Subcutaneous, Every 6 months   Estradiol (YUVAFEM) 10 MCG TABS vaginal tablet PLACE 1 TABLET VAGINALLY 2 TIMES A WEEK.   fluticasone-salmeterol (ADVAIR) 100-50 MCG/ACT AEPB 1 puff, Inhalation, 2 times daily   hydrochlorothiazide (MICROZIDE) 12.5 MG capsule Oral, Daily   HYDROcodone-acetaminophen (NORCO) 7.5-325 MG tablet 1 tablet, Oral, 3 times daily PRN   hydroxypropyl methylcellulose / hypromellose (ISOPTO TEARS / GONIOVISC) 2.5 % ophthalmic solution 1 drop, Both Eyes, 3 times daily PRN   levothyroxine (SYNTHROID) 88 MCG tablet TAKE 1 TABLET BY MOUTH EVERY DAY BEFORE BREAKFAST   Magnesium 300 MG CAPS Oral   Multiple  Vitamin (MULTIVITAMIN) capsule 1 capsule, Oral, Daily   naloxone (NARCAN) 4 MG/0.1ML LIQD nasal spray kit Use as directed   omeprazole (PRILOSEC) 40 MG capsule One pill 20-23 min before breakfast meal daily.   pravastatin (PRAVACHOL) 20 MG tablet TAKE 1 TABLET BY MOUTH EVERYDAY AT BEDTIME   Vitamin D3 2,000 Units, Oral, Daily   vitamin E 1000 UNIT capsule vitamin E       Objective:   Physical Exam BP 122/68   Pulse 68   Temp 98.2 F (36.8 C) (Oral)   Resp 18   Ht 5\' 5"  (1.651 m)   Wt 152 lb 6 oz (69.1 kg)   LMP 04/10/1998 (Approximate)   SpO2 98%   BMI 25.36 kg/m  General:   Well developed, NAD, BMI noted. HEENT:  Normocephalic . Face symmetric, atraumatic Lungs:  CTA B Normal respiratory effort, no intercostal retractions,  no accessory muscle use. Heart: RRR,  no murmur.  Lower extremities: no pretibial edema bilaterally  Skin: Not pale. Not jaundice Neurologic:  alert & oriented X3.  Speech normal, gait appropriate for age and unassisted Psych--  Cognition and judgment appear intact.  Cooperative with normal attention span and concentration.  Behavior appropriate. No anxious or depressed appearing.      Assessment     Assessment Hypothyroidism Hyperlipidemia: Started meds 09/2019 Lower extremity edema: On HCTZ GERD Migraines insmonia-- xanax prn COPD: per chest CTs, former heavy smoker, quit 2005 [facility]- Pain mngmt  -- back pain, 2017 had a MRI, saw Dr Cleophas DunkerWhitfield, Rx local injection, PT --h/o fibromyalgia --H/o Pleuritic chest pain, on-off, resolved  --spinal stenosis (MRI 05/2018)  Osteopenia:  Per ENDO --T score 2011   -2.6 (intolerant to fosamax, ok w/ Boniva x years) --T score 01-2013   -2.6 --T score 02-2015  -2.3, rx ca and cit D  Glaucoma Ocular Rosacea 2010 CV: LAD  artery disease  per CT scan of the chest, CP:  admitted, stress lest low risk 05-2015.  Control CV RF CAD, aortic sclerosis: Per CT Chronic hoarseness: ENT OV 04-2022, RTC prn  PLAN: Hypothyroidism: Good med compliance, check TSH. Hyperlipidemia: On Pravachol, check FLP AST ALT.  Doing great with lifestyle.  See next Obesity: Under the care of the wellness clinic, significant weight loss, overall feels better. Praised  Insomnia: On Xanax as needed Pain management: I addressed the issue with the patient in an effort to decrease the amount of medication she needs.  Current pain is at the hip, had a couple of local injections by ortho  and Dr. Wynn BankerKirsteins. Plan: We agreed to reduce hydrocodone to 3 tablets daily, okay to complement pain mngmt w/  cetaminophen.  See AVS.    Edema: On HCTZ.  Check BMP Chronic hoarseness saw ENT had fiberoptic laryngoscopy, Dx age-related vocal fold atrophy.  RTC prn Lung cancer screening: Test was  denied, patient not interested in any longer  RTC 4 months CPX

## 2022-07-14 NOTE — Patient Instructions (Addendum)
Vaccines I recommend:  RSV vaccine  Will decrease hydrocodone to 1 tablet 3 times a day.  I sent the prescription to you come pick up by April 18. You can complement your pain management with one  plain Tylenol 500 mg twice daily. Also a heating pad or ice pack.    GO TO THE LAB : Get the blood work     GO TO THE FRONT DESK, PLEASE SCHEDULE YOUR APPOINTMENTS Come back for physical exam by 11-2022

## 2022-07-15 NOTE — Assessment & Plan Note (Signed)
Hypothyroidism: Good med compliance, check TSH. Hyperlipidemia: On Pravachol, check FLP AST ALT.  Doing great with lifestyle.  See next Obesity: Under the care of the wellness clinic, significant weight loss, overall feels better. Praised  Insomnia: On Xanax as needed Pain management: I addressed the issue with the patient in an effort to decrease the amount of medication she needs.  Current pain is at the hip, had a couple of local injections by ortho  and Dr. Wynn Banker. Plan: We agreed to reduce hydrocodone to 3 tablets daily, okay to complement pain mngmt w/  cetaminophen.  See AVS.    Edema: On HCTZ.  Check BMP Chronic hoarseness saw ENT had fiberoptic laryngoscopy, Dx age-related vocal fold atrophy.  RTC prn Lung cancer screening: Test was denied, patient not interested in any longer  RTC 4 months CPX

## 2022-07-17 ENCOUNTER — Encounter: Payer: Self-pay | Admitting: Internal Medicine

## 2022-07-17 ENCOUNTER — Ambulatory Visit: Payer: Medicare HMO | Admitting: Bariatrics

## 2022-07-17 MED ORDER — POTASSIUM CHLORIDE CRYS ER 10 MEQ PO TBCR: 10.0000 meq | EXTENDED_RELEASE_TABLET | Freq: Every day | ORAL | 0 refills | Status: DC

## 2022-07-17 NOTE — Addendum Note (Signed)
Addended byConrad Bentley D on: 07/17/2022 07:58 AM   Modules accepted: Orders

## 2022-07-26 ENCOUNTER — Encounter: Payer: Self-pay | Admitting: Bariatrics

## 2022-07-26 ENCOUNTER — Ambulatory Visit (INDEPENDENT_AMBULATORY_CARE_PROVIDER_SITE_OTHER): Payer: Medicare HMO | Admitting: Bariatrics

## 2022-07-26 VITALS — BP 105/71 | HR 65 | Temp 97.7°F | Ht 65.0 in | Wt 149.0 lb

## 2022-07-26 DIAGNOSIS — Z6824 Body mass index (BMI) 24.0-24.9, adult: Secondary | ICD-10-CM | POA: Insufficient documentation

## 2022-07-26 DIAGNOSIS — E669 Obesity, unspecified: Secondary | ICD-10-CM

## 2022-07-26 DIAGNOSIS — E7849 Other hyperlipidemia: Secondary | ICD-10-CM | POA: Diagnosis not present

## 2022-07-27 ENCOUNTER — Other Ambulatory Visit: Payer: Self-pay | Admitting: Internal Medicine

## 2022-07-28 ENCOUNTER — Other Ambulatory Visit (HOSPITAL_BASED_OUTPATIENT_CLINIC_OR_DEPARTMENT_OTHER): Payer: Self-pay

## 2022-07-28 MED ORDER — HYDROCODONE-ACETAMINOPHEN 7.5-325 MG PO TABS
1.0000 | ORAL_TABLET | Freq: Three times a day (TID) | ORAL | 0 refills | Status: DC | PRN
  Filled 2022-07-28: qty 90, 30d supply, fill #0

## 2022-07-28 NOTE — Addendum Note (Signed)
Addended by: Abbe Amsterdam C on: 07/28/2022 04:05 PM   Modules accepted: Orders

## 2022-08-01 NOTE — Progress Notes (Signed)
Chief Complaint:   OBESITY [name] is here to discuss her progress with her obesity treatment plan along with follow-up of her obesity related diagnoses. [name] is on keeping a food journal and adhering to recommended goals of 1200 calories and 80 grams of protein and states she is following her eating plan approximately 70% of the time. [name] states she is walking for 30 minutes 3 times per week.  Today's visit was #: 10 Starting weight: 184 lbs Starting date: 11/23/2021 Today's weight: 149 lbs Today's date: 07/26/2022 Total lbs lost to date: 35 Total lbs lost since last in-office visit: 4  Interim History: [name] is down 4 pounds and her BMI is now 24.79, and need to discuss maintenance.  She is able to walk better and is more active.  Subjective:   1. Other hyperlipidemia [name] is taking Pravachol.  Assessment/Plan:   1. Other hyperlipidemia [name] will continue her medications, and she will keep her water and protein intake high.  Dietary supplement handout was provided.  2. Generalized obesity  3. BMI 24.0-24.9, adult [name] is currently in the action stage of change. As such, her goal is to continue with weight loss efforts. She has agreed to keeping a food journal and adhering to recommended goals of 1200 calories and 80 grams of protein.   No internal medicines.  Mindful eating was discussed.  Has a regular scale (149).  Patient is still weighing herself-critical weight.  Weight 145/150.  Consider fiber wraps.  Exercise goals: As is.  Behavioral modification strategies: increasing lean protein intake, decreasing simple carbohydrates, increasing vegetables, increasing water intake, decreasing eating out, no skipping meals, meal planning and cooking strategies, keeping healthy foods in the home, and planning for success.  [name] has agreed to follow-up with our clinic in 4 weeks. She was informed of the importance of frequent follow-up visits to maximize her  success with intensive lifestyle modifications for her multiple health conditions.   Objective:   Blood pressure 105/71, pulse 65, temperature 97.7 F (36.5 C), height  (1.651 m), weight 149 lb (67.6 kg), last menstrual period 04/10/1998, SpO2 99 %. Body mass index is 24.79 kg/m.  General: Cooperative, alert, well developed, in no acute distress. HEENT: Conjunctivae and lids unremarkable. Cardiovascular: Regular rhythm.  Lungs: Normal work of breathing. Neurologic: No focal deficits.   Lab Results  Component Value Date   CREATININE 0.80 07/14/2022   BUN 13 07/14/2022   NA 141 07/14/2022   K 3.1 (L) 07/14/2022   CL 100 07/14/2022   CO2 32 07/14/2022   Lab Results  Component Value Date   ALT 11 07/14/2022   AST 14 07/14/2022   ALKPHOS 78 10/18/2020   BILITOT 0.6 10/18/2020   Lab Results  Component Value Date   HGBA1C 5.7 (H) 11/23/2021   HGBA1C 5.8 03/24/2021   HGBA1C 5.5 05/22/2015   Lab Results  Component Value Date   INSULIN 8.7 11/23/2021   Lab Results  Component Value Date   TSH 0.36 07/14/2022   Lab Results  Component Value Date   CHOL 117 07/14/2022   HDL 46.60 07/14/2022   LDLCALC 55 07/14/2022   LDLDIRECT 92.0 03/04/2007   TRIG 79.0 07/14/2022   CHOLHDL 3 07/14/2022   Lab Results  Component Value Date   VD25OH 47.1 11/23/2021   VD25OH 44.87 10/21/2021   VD25OH 46.9 10/05/2020   Lab Results  Component Value Date   WBC 6.8 11/10/2021   HGB 13.7 11/10/2021   HCT 40.7  11/10/2021   MCV 91.0 11/10/2021   PLT 281.0 11/10/2021   No results found for: "IRON", "TIBC", "FERRITIN"  Attestation Statements:   Reviewed by clinician on day of visit: allergies, medications, problem list, medical history, surgical history, family history, social history, and previous encounter notes.   Trude Mcburney, am acting as Energy manager for [employer], DO.  I have reviewed the above documentation for accuracy and completeness, and I agree with the  above. Corinna Capra, DO

## 2022-08-22 ENCOUNTER — Ambulatory Visit (INDEPENDENT_AMBULATORY_CARE_PROVIDER_SITE_OTHER): Payer: Medicare HMO | Admitting: *Deleted

## 2022-08-22 VITALS — BP 100/67 | HR 64 | Ht 65.0 in | Wt 152.0 lb

## 2022-08-22 DIAGNOSIS — Z0000 Encounter for general adult medical examination without abnormal findings: Secondary | ICD-10-CM | POA: Diagnosis not present

## 2022-08-22 NOTE — Progress Notes (Signed)
Subjective:   [name] is a 77 y.o. female who presents for Medicare Annual (Subsequent) preventive examination.  Review of Systems     Cardiac Risk Factors include: advanced age (>4men, >84 women);dyslipidemia;hypertension     Objective:    Today's Vitals   08/22/22 0825  BP: 100/67  Pulse: 64  Weight: 152 lb (68.9 kg)  Height: 5\' 5"  (1.651 m)   Body mass index is 25.29 kg/m.     08/22/2022    8:28 AM 08/18/2021    8:28 AM 06/03/2021   10:09 AM 10/28/2020   11:06 AM 08/12/2020    8:34 AM 08/10/2020   10:17 AM 12/04/2019    8:00 AM  Advanced Directives  Does Patient Have a Medical Advance Directive? Yes Yes Yes Yes Yes Yes No  Type of Estate agent of [place];Living will Healthcare Power of [place];Out of facility DNR (pink MOST or yellow form);Living will Healthcare Power of [place];Living will;Out of facility DNR (pink MOST or yellow form) Healthcare Power of [place];Living will Healthcare Power of [place];Living will    Does patient want to make changes to medical advance directive? No - Patient declined     No - Patient declined   Copy of Healthcare Power of Attorney in Chart? Yes - validated most recent copy scanned in chart (See row information) Yes - validated most recent copy scanned in chart (See row information)  No - copy requested Yes - validated most recent copy scanned in chart (See row information)    Would patient like information on creating a medical advance directive?       No - Patient declined    Current Medications (verified) Outpatient Encounter Medications as of 08/22/2022  Medication Sig   ALPRAZolam (XANAX) 0.5 MG tablet TAKE 1 TABLET BY MOUTH AT BEDTIME AS NEEDED FOR ANXIETY   aspirin 81 MG EC tablet Take 81 mg by mouth daily. Swallow whole.   calcium carbonate (OS-CAL) 1250 (500 CA) MG chewable tablet Chew 1 tablet by mouth 2 (two) times daily.    Cholecalciferol (VITAMIN D3) 50 MCG (2000 UT) CHEW Chew 2,000 Units by  mouth daily.    cycloSPORINE (RESTASIS) 0.05 % ophthalmic emulsion 1 drop 2 (two) times daily.   denosumab (PROLIA) 60 MG/ML SOSY injection Inject 60 mg into the skin every 6 (six) months.   Estradiol (YUVAFEM) 10 MCG TABS vaginal tablet PLACE 1 TABLET VAGINALLY 2 TIMES A WEEK.   fluticasone-salmeterol (ADVAIR) 100-50 MCG/ACT AEPB Inhale 1 puff into the lungs 2 (two) times daily.   hydrochlorothiazide (MICROZIDE) 12.5 MG capsule TAKE 1 CAPSULE BY MOUTH EVERY DAY   HYDROcodone-acetaminophen (NORCO) 7.5-325 MG tablet Take 1 tablet by mouth 3 (three) times daily as needed for moderate pain.   hydroxypropyl methylcellulose / hypromellose (ISOPTO TEARS / GONIOVISC) 2.5 % ophthalmic solution Place 1 drop into both eyes 3 (three) times daily as needed for dry eyes.   levothyroxine (SYNTHROID) 88 MCG tablet TAKE 1 TABLET BY MOUTH EVERY DAY BEFORE BREAKFAST   Magnesium 300 MG CAPS Take by mouth.   Multiple Vitamin (MULTIVITAMIN) capsule Take 1 capsule by mouth daily.     naloxone (NARCAN) 4 MG/0.1ML LIQD nasal spray kit Use as directed   omeprazole (PRILOSEC) 40 MG capsule One pill 20-23 min before breakfast meal daily.   potassium chloride (KLOR-CON M) 10 MEQ tablet Take 1 tablet (10 mEq total) by mouth daily.   pravastatin (PRAVACHOL) 20 MG tablet TAKE 1 TABLET BY MOUTH EVERYDAY AT BEDTIME  vitamin E 1000 UNIT capsule vitamin E   No facility-administered encounter medications on file as of 08/22/2022.    Allergies (verified) Alendronate sodium, Bactrim [sulfamethoxazole-trimethoprim], Contrast media [iodinated contrast media], Iodine, Milnacipran, Penicillins, and Prednisone   History: Past Medical History:  Diagnosis Date   Allergy see chart   Anxiety 06/2020   back pain   Arthritis    Back pain    Back pain    Cataract    both eyes rt. 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   Fibromyalgia    see rheumatology   GERD (gastroesophageal reflux disease)    PAST HX    High blood pressure    High cholesterol    Hyperlipidemia    Hypothyroidism    Joint pain    Migraine    Ocular rosacea 03/2009   on doxy   Osteoporosis    osteopenia   Pleuritic chest pain    etiology unclear, has beeb eval by pulmonary and rheumatology (autoimmune dz?)   Swelling of lower extremity    Wrist fracture 2018   Past Surgical History:  Procedure Laterality Date   ANTERIOR LAT LUMBAR FUSION N/A 12/03/2019   Procedure: ANTERIOR LATERAL LUMBAR FUSION L2-4;  Surgeon: Venita Lick, MD;  Location: [facility] OR;  Service: Orthopedics;  Laterality: N/A;  4 hrs   COLONOSCOPY     COSMETIC SURGERY  04/12/2018   L upper eyelid ptosis repair, and trichophytic brow lift   ESOPHAGOGASTRODUODENOSCOPY (EGD) WITH PROPOFOL N/A 03/27/2013   Procedure: ESOPHAGOGASTRODUODENOSCOPY (EGD) WITH PROPOFOL;  Surgeon: Rachael Fee, MD;  Location: [facility];  Service: Endoscopy;  Laterality: N/A;  possible dil   EYE SURGERY     growth on cornea-bil.   FRACTURE SURGERY  hand 2017   plastic surgery face  2013   POLYPECTOMY     right rotator cuff repair  2010   SPINE SURGERY     TONSILLECTOMY     UPPER GASTROINTESTINAL ENDOSCOPY     Family History  Adopted: Yes  Problem Relation Age of Onset   Cancer Neg Hx    Depression Neg Hx    Diabetes Neg Hx    Stroke Neg Hx    Hypertension Neg Hx    Heart disease Neg Hx    Social History   Socioeconomic History   Marital status: Married    Spouse name: Not on file   Number of children: 0   Years of education: Not on file   Highest education level: Some college, no degree  Occupational History   Occupation: retired, Warehouse manager work    Associate Professor: RETIRED  Tobacco Use   Smoking status: Former    Packs/day: 1.00    Years: 38.50    Additional pack years:  0.00    Total pack years: 38.50    Types: Cigarettes    Quit date: 01/13/2004    Years since quitting: 18.6   Smokeless tobacco: Never   Tobacco comments:    quit 2005  Vaping Use   Vaping Use: Never used  Substance and Sexual Activity   Alcohol use: No    Alcohol/week: 0.0 standard drinks of alcohol    Comment: rare   Drug use: No   Sexual  activity: Not Currently    Comment: first intercourse >16  Other Topics Concern   Not on file  Social History Narrative   No biological children      Social Determinants of Health   Financial Resource Strain: Low Risk  (07/09/2022)   Overall Financial Resource Strain (CARDIA)    Difficulty of Paying Living Expenses: Not hard at all  Food Insecurity: No Food Insecurity (07/09/2022)   Hunger Vital Sign    Worried About Running Out of Food in the Last Year: Never true    Ran Out of Food in the Last Year: Never true  Transportation Needs: No Transportation Needs (07/09/2022)   PRAPARE - Administrator, Civil Service (Medical): No    Lack of Transportation (Non-Medical): No  Physical Activity: Insufficiently Active (07/09/2022)   Exercise Vital Sign    Days of Exercise per Week: 3 days    Minutes of Exercise per Session: 20 min  Stress: No Stress Concern Present (07/09/2022)   [employer] of Occupational Health - Occupational Stress Questionnaire    Feeling of Stress : Not at all  Social Connections: Moderately Integrated (07/09/2022)   Social Connection and Isolation Panel [NHANES]    Frequency of Communication with Friends and Family: Three times a week    Frequency of Social Gatherings with Friends and Family: Once a week    Attends Religious Services: Never    Database administrator or Organizations: Yes    Attends Engineer, structural: More than 4 times per year    Marital Status: Married    Tobacco Counseling Counseling given: Not Answered Tobacco comments: quit 2005   Clinical Intake:  Pre-visit  preparation completed: Yes  Pain : No/denies pain  BMI - recorded: 25.29 Nutritional Status: BMI 25 -29 Overweight Nutritional Risks: None  How often do you need to have someone help you when you read instructions, pamphlets, or other written materials from your doctor or pharmacy?: 1 - Never   Activities of Daily Living    08/22/2022    8:38 AM  In your present state of health, do you have any difficulty performing the following activities:  Hearing? 1  Comment wears hearing aids  Vision? 0  Difficulty concentrating or making decisions? 0  Walking or climbing stairs? 0  Dressing or bathing? 0  Doing errands, shopping? 0  Preparing Food and eating ? N  Using the Toilet? N  In the past six months, have you accidently leaked urine? Y  Comment a little stress incontinence  Do you have problems with loss of bowel control? N  Managing your Medications? N  Managing your Finances? N  Housekeeping or managing your Housekeeping? N    Patient Care Team: Wanda Plump, MD as PCP - General Wendall Stade, MD as PCP - Cardiology (Cardiology) Patton Salles, MD as Consulting Physician (Obstetrics and Gynecology) Etta Quill, MD as Attending Physician (Ophthalmology) Venita Lick, MD as Consulting Physician (Orthopedic Surgery)  Indicate any recent Medical Services you may have received from other than [facility] providers in the past year (date may be approximate).     Assessment:   This is a routine wellness examination for [place].  Hearing/Vision screen No results found.  Dietary issues and exercise activities discussed: Current Exercise Habits: The patient does not participate in regular exercise at present   Goals Addressed   None    Depression Screen    08/22/2022    8:35 AM  07/14/2022    9:26 AM 04/13/2022    9:46 AM 12/13/2021    2:52 PM 11/23/2021    9:04 AM 11/10/2021    9:58 AM 09/16/2021   11:07 AM  PHQ 2/9 Scores  PHQ - 2 Score 0 0 0 0 2 0 0  PHQ- 9  Score     9 0     Fall Risk    08/22/2022    8:35 AM 07/14/2022    9:26 AM 03/07/2022   11:30 AM 12/13/2021    2:52 PM 11/10/2021    9:28 AM  Fall Risk   Falls in the past year? 0 0 0 0 0  Number falls in past yr: 0 0  0 0  Injury with Fall? 0 0  0 0  Risk for fall due to : No Fall Risks      Follow up Falls evaluation completed Falls evaluation completed   Falls evaluation completed    FALL RISK PREVENTION PERTAINING TO THE HOME:  Any stairs in or around the home? Yes  If so, are there any without handrails? No  Home free of loose throw rugs in walkways, pet beds, electrical cords, etc? Yes  Adequate lighting in your home to reduce risk of falls? Yes   ASSISTIVE DEVICES UTILIZED TO PREVENT FALLS:  Life alert? No  Use of a cane, walker or w/c? No  Grab bars in the bathroom? Yes  Shower chair or bench in shower? No  Elevated toilet seat or a handicapped toilet? Yes   TIMED UP AND GO:  Was the test performed? Yes .  Length of time to ambulate 10 feet: 6 sec.   Gait steady and fast without use of assistive device  Cognitive Function:    08/22/2022    9:01 AM 12/01/2015   10:01 AM  MMSE - Mini Mental State Exam  Not completed: Unable to complete   Orientation to time  5  Orientation to Place  5  Registration  3  Attention/ Calculation  5  Recall  2  Language- name 2 objects  2  Language- repeat  1  Language- follow 3 step command  3  Language- read & follow direction  1  Write a sentence  1  Copy design  1  Total score  29        08/18/2021    8:35 AM  6CIT Screen  What Year? 0 points  What month? 0 points  What time? 0 points  Count back from 20 0 points  Months in reverse 0 points  Repeat phrase 0 points  Total Score 0 points    Immunizations Immunization History  Administered Date(s) Administered   COVID-19, mRNA, vaccine(Comirnaty)12 years and older 03/14/2022   Fluad Quad(high Dose 65+) 12/13/2018, 01/30/2022   H1N1 04/09/2008   Influenza Split  02/28/2012   Influenza Whole 03/04/2007, 01/29/2009, 02/07/2010   Influenza, High Dose Seasonal PF 01/07/2015, 01/04/2016, 01/10/2017, 01/19/2020, 12/26/2020   Influenza,inj,Quad PF,6+ Mos 01/07/2013, 02/10/2014   Influenza,inj,quad, With Preservative 01/14/2018   PFIZER(Purple Top)SARS-COV-2 Vaccination 05/24/2019, 06/15/2019, 01/24/2020   PNEUMOCOCCAL CONJUGATE-20 11/10/2021   Pfizer Covid-19 Vaccine Bivalent Booster 50yrs & up 01/19/2021   Pneumococcal Conjugate-13 08/08/2013   Pneumococcal Polysaccharide-23 05/11/2004, 06/12/2011   Td 02/08/2006, 09/08/2015   Zoster Recombinat (Shingrix) 11/21/2018, 03/13/2019   Zoster, Live 06/16/2010    TDAP status: Up to date  Flu Vaccine status: Up to date  Pneumococcal vaccine status: Up to date  Covid-19 vaccine status: Information provided  on how to obtain vaccines.   Qualifies for Shingles Vaccine? Yes   Zostavax completed Yes   Shingrix Completed?: Yes  Screening Tests Health Maintenance  Topic Date Due   Medicare Annual Wellness (AWV)  08/19/2022   COVID-19 Vaccine (6 - 2023-24 season) 12/07/2022 (Originally 05/09/2022)   INFLUENZA VACCINE  11/09/2022   COLONOSCOPY (Pts 45-60yrs Insurance coverage will need to be confirmed)  09/03/2025   DTaP/Tdap/Td (3 - Tdap) 09/07/2025   Pneumonia Vaccine 21+ Years old  Completed   DEXA SCAN  Completed   Hepatitis C Screening  Completed   Zoster Vaccines- Shingrix  Completed   HPV VACCINES  Aged Out    Health Maintenance  Health Maintenance Due  Topic Date Due   Medicare Annual Wellness (AWV)  08/19/2022    Colorectal cancer screening: Type of screening: Colonoscopy. Completed 09/03/20. Repeat every 5 years  Mammogram status: Completed 04/06/22. Repeat every year  Bone Density status: Completed 03/29/21. Results reflect: Bone density results: OSTEOPOROSIS. Repeat every 2 years.  Lung Cancer Screening: (Low Dose CT Chest recommended if Age 27-80 years, 30 pack-year currently  smoking OR have quit w/in 15years.) does not qualify.   Additional Screening:  Hepatitis C Screening: does qualify; Completed 09/08/15  Vision Screening: Recommended annual ophthalmology exams for early detection of glaucoma and other disorders of the eye. Is the patient up to date with their annual eye exam?  Yes  Who is the provider or what is the name of the office in which the patient attends annual eye exams? Dr. Martha Clan If pt is not established with a provider, would they like to be referred to a provider to establish care? No .   Dental Screening: Recommended annual dental exams for proper oral hygiene  Community Resource Referral / Chronic Care Management: CRR required this visit?  No   CCM required this visit?  No      Plan:     I have personally reviewed and noted the following in the patient's chart:   Medical and social history Use of alcohol, tobacco or illicit drugs  Current medications and supplements including opioid prescriptions. Patient is currently taking opioid prescriptions. Information provided to patient regarding non-opioid alternatives. Patient advised to discuss non-opioid treatment plan with their provider. Functional ability and status Nutritional status Physical activity Advanced directives List of other physicians Hospitalizations, surgeries, and ER visits in previous 12 months Vitals Screenings to include cognitive, depression, and falls Referrals and appointments  In addition, I have reviewed and discussed with patient certain preventive protocols, quality metrics, and best practice recommendations. A written personalized care plan for preventive services as well as general preventive health recommendations were provided to patient.     Donne Anon, [place]   08/22/2022   Nurse Notes: None

## 2022-08-22 NOTE — Patient Instructions (Signed)
[name] , Thank you for taking time to come for your Medicare Wellness Visit. I appreciate your ongoing commitment to your health goals. Please review the following plan we discussed and let me know if I can assist you in the future.     This is a list of the screening recommended for you and due dates:  Health Maintenance  Topic Date Due   COVID-19 Vaccine (6 - 2023-24 season) 12/07/2022*   Flu Shot  11/09/2022   Medicare Annual Wellness Visit  08/22/2023   Colon Cancer Screening  09/03/2025   DTaP/Tdap/Td vaccine (3 - Tdap) 09/07/2025   Pneumonia Vaccine  Completed   DEXA scan (bone density measurement)  Completed   Hepatitis C Screening: USPSTF Recommendation to screen - Ages 19-79 yo.  Completed   Zoster (Shingles) Vaccine  Completed   HPV Vaccine  Aged Out  *Topic was postponed. The date shown is not the original due date.    Next appointment: Follow up in one year for your annual wellness visit.   Preventive Care 20 Years and Older, Female Preventive care refers to lifestyle choices and visits with your health care provider that can promote health and wellness. What does preventive care include? A yearly physical exam. This is also called an annual well check. Dental exams once or twice a year. Routine eye exams. Ask your health care provider how often you should have your eyes checked. Personal lifestyle choices, including: Daily care of your teeth and gums. Regular physical activity. Eating a healthy diet. Avoiding tobacco and drug use. Limiting alcohol use. Practicing safe sex. Taking low-dose aspirin every day. Taking vitamin and mineral supplements as recommended by your health care provider. What happens during an annual well check? The services and screenings done by your health care provider during your annual well check will depend on your age, overall health, lifestyle risk factors, and family history of disease. Counseling  Your health care provider may  ask you questions about your: Alcohol use. Tobacco use. Drug use. Emotional well-being. Home and relationship well-being. Sexual activity. Eating habits. History of falls. Memory and ability to understand (cognition). Work and work Astronomer. Reproductive health. Screening  You may have the following tests or measurements: Height, weight, and BMI. Blood pressure. Lipid and cholesterol levels. These may be checked every 5 years, or more frequently if you are over 62 years old. Skin check. Lung cancer screening. You may have this screening every year starting at age 86 if you have a 30-pack-year history of smoking and currently smoke or have quit within the past 15 years. Fecal occult blood test (FOBT) of the stool. You may have this test every year starting at age 42. Flexible sigmoidoscopy or colonoscopy. You may have a sigmoidoscopy every 5 years or a colonoscopy every 10 years starting at age 49. Hepatitis C blood test. Hepatitis B blood test. Sexually transmitted disease (STD) testing. Diabetes screening. This is done by checking your blood sugar (glucose) after you have not eaten for a while (fasting). You may have this done every 1-3 years. Bone density scan. This is done to screen for osteoporosis. You may have this done starting at age 14. Mammogram. This may be done every 1-2 years. Talk to your health care provider about how often you should have regular mammograms. Talk with your health care provider about your test results, treatment options, and if necessary, the need for more tests. Vaccines  Your health care provider may recommend certain vaccines, such as: Influenza  vaccine. This is recommended every year. Tetanus, diphtheria, and acellular pertussis (Tdap, Td) vaccine. You may need a Td booster every 10 years. Zoster vaccine. You may need this after age 69. Pneumococcal 13-valent conjugate (PCV13) vaccine. One dose is recommended after age 78. Pneumococcal  polysaccharide (PPSV23) vaccine. One dose is recommended after age 26. Talk to your health care provider about which screenings and vaccines you need and how often you need them. This information is not intended to replace advice given to you by your health care provider. Make sure you discuss any questions you have with your health care provider. Document Released: 04/23/2015 Document Revised: 12/15/2015 Document Reviewed: 01/26/2015 Elsevier Interactive Patient Education  2017 [employer].  Fall Prevention in the Home Falls can cause injuries. They can happen to people of all ages. There are many things you can do to make your home safe and to help prevent falls. What can I do on the outside of my home? Regularly fix the edges of walkways and driveways and fix any cracks. Remove anything that might make you trip as you walk through a door, such as a raised step or threshold. Trim any bushes or trees on the path to your home. Use bright outdoor lighting. Clear any walking paths of anything that might make someone trip, such as rocks or tools. Regularly check to see if handrails are loose or broken. Make sure that both sides of any steps have handrails. Any raised decks and porches should have guardrails on the edges. Have any leaves, snow, or ice cleared regularly. Use sand or salt on walking paths during winter. Clean up any spills in your garage right away. This includes oil or grease spills. What can I do in the bathroom? Use night lights. Install grab bars by the toilet and in the tub and shower. Do not use towel bars as grab bars. Use non-skid mats or decals in the tub or shower. If you need to sit down in the shower, use a plastic, non-slip stool. Keep the floor dry. Clean up any water that spills on the floor as soon as it happens. Remove soap buildup in the tub or shower regularly. Attach bath mats securely with double-sided non-slip rug tape. Do not have throw rugs and other  things on the floor that can make you trip. What can I do in the bedroom? Use night lights. Make sure that you have a light by your bed that is easy to reach. Do not use any sheets or blankets that are too big for your bed. They should not hang down onto the floor. Have a firm chair that has side arms. You can use this for support while you get dressed. Do not have throw rugs and other things on the floor that can make you trip. What can I do in the kitchen? Clean up any spills right away. Avoid walking on wet floors. Keep items that you use a lot in easy-to-reach places. If you need to reach something above you, use a strong step stool that has a grab bar. Keep electrical cords out of the way. Do not use floor polish or wax that makes floors slippery. If you must use wax, use non-skid floor wax. Do not have throw rugs and other things on the floor that can make you trip. What can I do with my stairs? Do not leave any items on the stairs. Make sure that there are handrails on both sides of the stairs and use them. Fix  handrails that are broken or loose. Make sure that handrails are as long as the stairways. Check any carpeting to make sure that it is firmly attached to the stairs. Fix any carpet that is loose or worn. Avoid having throw rugs at the top or bottom of the stairs. If you do have throw rugs, attach them to the floor with carpet tape. Make sure that you have a light switch at the top of the stairs and the bottom of the stairs. If you do not have them, ask someone to add them for you. What else can I do to help prevent falls? Wear shoes that: Do not have high heels. Have rubber bottoms. Are comfortable and fit you well. Are closed at the toe. Do not wear sandals. If you use a stepladder: Make sure that it is fully opened. Do not climb a closed stepladder. Make sure that both sides of the stepladder are locked into place. Ask someone to hold it for you, if possible. Clearly  mark and make sure that you can see: Any grab bars or handrails. First and last steps. Where the edge of each step is. Use tools that help you move around (mobility aids) if they are needed. These include: Canes. Walkers. Scooters. Crutches. Turn on the lights when you go into a dark area. Replace any light bulbs as soon as they burn out. Set up your furniture so you have a clear path. Avoid moving your furniture around. If any of your floors are uneven, fix them. If there are any pets around you, be aware of where they are. Review your medicines with your doctor. Some medicines can make you feel dizzy. This can increase your chance of falling. Ask your doctor what other things that you can do to help prevent falls. This information is not intended to replace advice given to you by your health care provider. Make sure you discuss any questions you have with your health care provider. Document Released: 01/21/2009 Document Revised: 09/02/2015 Document Reviewed: 05/01/2014 Elsevier Interactive Patient Education  2017 [employer].

## 2022-08-23 ENCOUNTER — Encounter: Payer: Self-pay | Admitting: Bariatrics

## 2022-08-23 ENCOUNTER — Ambulatory Visit (INDEPENDENT_AMBULATORY_CARE_PROVIDER_SITE_OTHER): Payer: Medicare HMO | Admitting: Bariatrics

## 2022-08-23 VITALS — BP 111/72 | HR 63 | Ht 65.0 in | Wt 148.0 lb

## 2022-08-23 DIAGNOSIS — R7303 Prediabetes: Secondary | ICD-10-CM

## 2022-08-23 DIAGNOSIS — E669 Obesity, unspecified: Secondary | ICD-10-CM

## 2022-08-23 DIAGNOSIS — Z6824 Body mass index (BMI) 24.0-24.9, adult: Secondary | ICD-10-CM

## 2022-08-25 ENCOUNTER — Telehealth: Payer: Self-pay | Admitting: Family Medicine

## 2022-08-28 ENCOUNTER — Other Ambulatory Visit (HOSPITAL_BASED_OUTPATIENT_CLINIC_OR_DEPARTMENT_OTHER): Payer: Self-pay

## 2022-08-28 MED ORDER — HYDROCODONE-ACETAMINOPHEN 7.5-325 MG PO TABS
1.0000 | ORAL_TABLET | Freq: Three times a day (TID) | ORAL | 0 refills | Status: DC | PRN
  Filled 2022-08-28 – 2022-10-02 (×2): qty 90, 30d supply, fill #0

## 2022-08-28 MED ORDER — HYDROCODONE-ACETAMINOPHEN 7.5-325 MG PO TABS
1.0000 | ORAL_TABLET | Freq: Three times a day (TID) | ORAL | 0 refills | Status: DC | PRN
  Filled 2022-08-28 – 2022-08-29 (×2): qty 90, 30d supply, fill #0

## 2022-08-28 NOTE — Telephone Encounter (Signed)
Requesting: hydrocodone 7.5-325mg   Contract: 11/10/21 UDS: 11/10/21 Last Visit: 07/14/22 Next Visit: 11/15/22 Last Refill: 07/28/22 #90 and 0RF   Please Advise

## 2022-08-28 NOTE — Telephone Encounter (Signed)
PDMP okay, Rx sent 

## 2022-08-28 NOTE — Progress Notes (Unsigned)
Chief Complaint:   OBESITY [name] is here to discuss her progress with her obesity treatment plan along with follow-up of her obesity related diagnoses. [name] is on keeping a food journal and adhering to recommended goals of 1200 calories and 80 grams of protein and states she is following her eating plan approximately 50% of the time. [name] states she is walking the dog for 15 minutes 7 times per week.  Today's visit was #: 11 Starting weight: 184 lbs Starting date: 11/23/2021 Today's weight: 148 lbs Today's date: 08/23/2022 Total lbs lost to date: 36 Total lbs lost since last in-office visit: 1  Interim History: [name] is down 1 pound and she is doing very well.  She is doing well with her protein and water intake.  Subjective:   1. Prediabetes [name] is not on medications currently.  Assessment/Plan:   1. Prediabetes [name] will continue to keep all carbohydrates low (sweets and starches), and healthy snacks were discussed.  2. Generalized obesity  3. BMI 24.0-24.9, adult [name] is currently in the action stage of change. As such, her goal is to continue with weight loss efforts. She has agreed to keeping a food journal and adhering to recommended goals of 1200 calories and 80 grams of protein.   She will work on weight maintenance.  She will purchase a good scale.  Critical weight and healthy carbohydrates were discussed.  We will recheck fasting IC at her next visit, and she is to arrive 30 minutes prior to her visit.  Exercise goals: As is.   Behavioral modification strategies: increasing lean protein intake, decreasing simple carbohydrates, increasing vegetables, increasing water intake, decreasing eating out, no skipping meals, meal planning and cooking strategies, keeping healthy foods in the home, and planning for success.  [name] has agreed to follow-up with our clinic in 6 to 8 weeks. She was informed of the importance of frequent follow-up visits to  maximize her success with intensive lifestyle modifications for her multiple health conditions.   Objective:   Blood pressure 111/72, pulse 63, height 5\' 5"  (1.651 m), weight 148 lb (67.1 kg), last menstrual period 04/10/1998, SpO2 100 %. Body mass index is 24.63 kg/m.  General: Cooperative, alert, well developed, in no acute distress. HEENT: Conjunctivae and lids unremarkable. Cardiovascular: Regular rhythm.  Lungs: Normal work of breathing. Neurologic: No focal deficits.   Lab Results  Component Value Date   CREATININE 0.80 07/14/2022   BUN 13 07/14/2022   NA 141 07/14/2022   K 3.1 (L) 07/14/2022   CL 100 07/14/2022   CO2 32 07/14/2022   Lab Results  Component Value Date   ALT 11 07/14/2022   AST 14 07/14/2022   ALKPHOS 78 10/18/2020   BILITOT 0.6 10/18/2020   Lab Results  Component Value Date   HGBA1C 5.7 (H) 11/23/2021   HGBA1C 5.8 03/24/2021   HGBA1C 5.5 05/22/2015   Lab Results  Component Value Date   INSULIN 8.7 11/23/2021   Lab Results  Component Value Date   TSH 0.36 07/14/2022   Lab Results  Component Value Date   CHOL 117 07/14/2022   HDL 46.60 07/14/2022   LDLCALC 55 07/14/2022   LDLDIRECT 92.0 03/04/2007   TRIG 79.0 07/14/2022   CHOLHDL 3 07/14/2022   Lab Results  Component Value Date   VD25OH 47.1 11/23/2021   VD25OH 44.87 10/21/2021   VD25OH 46.9 10/05/2020   Lab Results  Component Value Date   WBC 6.8 11/10/2021   HGB 13.7 11/10/2021  HCT 40.7 11/10/2021   MCV 91.0 11/10/2021   PLT 281.0 11/10/2021   No results found for: "IRON", "TIBC", "FERRITIN"  Attestation Statements:   Reviewed by clinician on day of visit: allergies, medications, problem list, medical history, surgical history, family history, social history, and previous encounter notes.   Trude Mcburney, am acting as Energy manager for [employer], DO.  I have reviewed the above documentation for accuracy and completeness, and I agree with the above. Corinna Capra, DO

## 2022-08-29 ENCOUNTER — Other Ambulatory Visit: Payer: Self-pay

## 2022-08-29 ENCOUNTER — Other Ambulatory Visit (HOSPITAL_BASED_OUTPATIENT_CLINIC_OR_DEPARTMENT_OTHER): Payer: Self-pay

## 2022-08-30 ENCOUNTER — Encounter: Payer: Self-pay | Admitting: Bariatrics

## 2022-09-01 ENCOUNTER — Other Ambulatory Visit: Payer: Self-pay | Admitting: Internal Medicine

## 2022-09-05 ENCOUNTER — Other Ambulatory Visit: Payer: Self-pay | Admitting: Internal Medicine

## 2022-09-10 NOTE — Telephone Encounter (Signed)
Prolia VOB initiated via [email or web address]  Last OV: 10/21/21 Next OV: 10/25/22 Last Prolia inj: 04/25/22 Next Prolia inj DUE: 10/25/22

## 2022-09-27 NOTE — Telephone Encounter (Addendum)
Prior Auth required for PROLIA  PA PROCESS DETAILS: PA is required. Call [phone] or complete the PA form available at [email or web address] Fax completed form to [phone]. Or submit PA request online at [email or web address]  

## 2022-09-28 ENCOUNTER — Other Ambulatory Visit: Payer: Self-pay | Admitting: Internal Medicine

## 2022-10-02 ENCOUNTER — Other Ambulatory Visit: Payer: Self-pay | Admitting: Internal Medicine

## 2022-10-02 ENCOUNTER — Other Ambulatory Visit (HOSPITAL_BASED_OUTPATIENT_CLINIC_OR_DEPARTMENT_OTHER): Payer: Self-pay

## 2022-10-03 ENCOUNTER — Other Ambulatory Visit (HOSPITAL_BASED_OUTPATIENT_CLINIC_OR_DEPARTMENT_OTHER): Payer: Self-pay

## 2022-10-08 NOTE — Telephone Encounter (Signed)
PA#: [id number] Valid: 04/27/22-04/28/23   

## 2022-10-08 NOTE — Telephone Encounter (Signed)
Pt ready for scheduling on or after 10/25/22  Out-of-pocket cost due at time of visit: $327  Primary: [employer] Medicare Adv PPO Prolia co-insurance: 20% (approximately $302) Admin fee co-insurance: 20% (approximately $25)  Deductible: does not apply  Prior Auth: APPROVED  PA#: [id number] Valid: 04/27/22-04/28/23  Secondary: N/A Prolia co-insurance:  Admin fee co-insurance:  Deductible:  Prior Auth:  PA# Valid:   ** This summary of benefits is an estimation of the patient's out-of-pocket cost. Exact cost may vary based on individual plan coverage.

## 2022-10-10 ENCOUNTER — Other Ambulatory Visit: Payer: Self-pay

## 2022-10-10 MED ORDER — ESTRADIOL 10 MCG VA TABS: ORAL_TABLET | VAGINAL | 2 refills | Status: DC

## 2022-10-10 NOTE — Telephone Encounter (Signed)
Medication refill request: yuvafem vaginal insert Last OV:  08-06-21 Next AEX: 11-27-22 with BS Last MMG (if hormonal medication request): 04-05-22 birads 1:neg Refill authorized: please approve if appropriate

## 2022-10-17 DIAGNOSIS — H04123 Dry eye syndrome of bilateral lacrimal glands: Secondary | ICD-10-CM | POA: Diagnosis not present

## 2022-10-17 DIAGNOSIS — H5052 Exophoria: Secondary | ICD-10-CM | POA: Diagnosis not present

## 2022-10-17 DIAGNOSIS — H43393 Other vitreous opacities, bilateral: Secondary | ICD-10-CM | POA: Diagnosis not present

## 2022-10-17 DIAGNOSIS — Z0100 Encounter for examination of eyes and vision without abnormal findings: Secondary | ICD-10-CM | POA: Diagnosis not present

## 2022-10-17 DIAGNOSIS — H18453 Nodular corneal degeneration, bilateral: Secondary | ICD-10-CM | POA: Diagnosis not present

## 2022-10-18 ENCOUNTER — Encounter: Payer: Self-pay | Admitting: Nurse Practitioner

## 2022-10-18 ENCOUNTER — Ambulatory Visit: Payer: Medicare HMO | Admitting: Bariatrics

## 2022-10-18 ENCOUNTER — Ambulatory Visit (INDEPENDENT_AMBULATORY_CARE_PROVIDER_SITE_OTHER): Payer: Medicare HMO | Admitting: Nurse Practitioner

## 2022-10-18 VITALS — BP 104/65 | HR 64 | Temp 98.3°F | Ht 65.0 in | Wt 144.0 lb

## 2022-10-18 DIAGNOSIS — R0602 Shortness of breath: Secondary | ICD-10-CM

## 2022-10-18 DIAGNOSIS — E7849 Other hyperlipidemia: Secondary | ICD-10-CM | POA: Diagnosis not present

## 2022-10-18 DIAGNOSIS — E669 Obesity, unspecified: Secondary | ICD-10-CM

## 2022-10-18 DIAGNOSIS — Z6823 Body mass index (BMI) 23.0-23.9, adult: Secondary | ICD-10-CM

## 2022-10-18 NOTE — Progress Notes (Signed)
Office: [phone]  /  Fax: [phone]  WEIGHT SUMMARY AND BIOMETRICS  Weight Lost Since Last Visit: 4lb  Weight Gained Since Last Visit: 0lb   Vitals Temp: 98.3 F (36.8 C) BP: 104/65 Pulse Rate: 64 SpO2: 98 %   Anthropometric Measurements Height: 5\' 5"  (1.651 m) Weight: 144 lb (65.3 kg) BMI (Calculated): 23.96 Weight at Last Visit: 148lb Weight Lost Since Last Visit: 4lb Weight Gained Since Last Visit: 0lb Starting Weight: 184lb Total Weight Loss (lbs): 40 lb (18.1 kg)   Body Composition  Body Fat %: 36.2 % Fat Mass (lbs): 52.2 lbs Muscle Mass (lbs): 87.6 lbs Total Body Water (lbs): 61.4 lbs Visceral Fat Rating : 10   Other Clinical Data RMR: 1066 Fasting: Yes Labs: No Today's Visit #: 12 Starting Date: 11/23/21     HPI  Chief Complaint: OBESITY  [name] is here to discuss her progress with her obesity treatment plan. She is on the keeping a food journal and adhering to recommended goals of 1200 calories and 80 protein and states she is following her eating plan approximately 60 % of the time. She states she is exercising 20 minutes 5 days per week.   Interval History:  Since last office visit she she has lost 4 pounds. She is not skipping meals.  She is mindful of what she eats and tries to make healthier choices. She is averaging around 1300-1400 calories and 50 grams of protein. Snacks:  peanut butter crackers, popcorn, oreo thin cookies.  She drinks water and 6 diet sodas daily.  Her highest weight was 200 lbs.  Denies hunger and cravings.  Rarely eats out.     Pharmacotherapy for weight loss: She is not currently taking medications  for medical weight loss.    Previous pharmacotherapy for medical weight loss:  none  Bariatric surgery:  Patient has not had bariatric surgery.   Hyperlipidemia Medication(s): Pravastatin 20mg . Denies side effects.   FH: unknown-patient was adopted  Lab Results  Component Value Date   CHOL 117 07/14/2022    HDL 46.60 07/14/2022   LDLCALC 55 07/14/2022   LDLDIRECT 92.0 03/04/2007   TRIG 79.0 07/14/2022   CHOLHDL 3 07/14/2022   Lab Results  Component Value Date   ALT 11 07/14/2022   AST 14 07/14/2022   ALKPHOS 78 10/18/2020   BILITOT 0.6 10/18/2020   The ASCVD Risk score (Arnett DK, et al., 2019) failed to calculate for the following reasons:   The valid total cholesterol range is 130 to 320 mg/dL   PHYSICAL EXAM:  Blood pressure 104/65, pulse 64, temperature 98.3 F (36.8 C), height 5\' 5"  (1.651 m), weight 144 lb (65.3 kg), last menstrual period 04/10/1998, SpO2 98 %. Body mass index is 23.96 kg/m.  General: She is overweight, cooperative, alert, well developed, and in no acute distress. PSYCH: Has normal mood, affect and thought process.   Extremities: No edema.  Neurologic: No gross sensory or motor deficits. No tremors or fasciculations noted.    DIAGNOSTIC DATA REVIEWED:  BMET    Component Value Date/Time   NA 141 07/14/2022 1001   NA 137 12/16/2020 1505   K 3.1 (L) 07/14/2022 1001   CL 100 07/14/2022 1001   CO2 32 07/14/2022 1001   GLUCOSE 85 07/14/2022 1001   GLUCOSE 129 10/12/2009 0000   BUN 13 07/14/2022 1001   BUN 10 12/16/2020 1505   CREATININE 0.80 07/14/2022 1001   CREATININE 0.79 01/29/2020 1056   CALCIUM 9.2 07/14/2022 1001  GFRNONAA >60 12/04/2019 0559   GFRAA >60 12/04/2019 0559   Lab Results  Component Value Date   HGBA1C 5.7 (H) 11/23/2021   HGBA1C 5.5 05/22/2015   Lab Results  Component Value Date   INSULIN 8.7 11/23/2021   Lab Results  Component Value Date   TSH 0.36 07/14/2022   CBC    Component Value Date/Time   WBC 6.8 11/10/2021 1003   RBC 4.47 11/10/2021 1003   HGB 13.7 11/10/2021 1003   HCT 40.7 11/10/2021 1003   PLT 281.0 11/10/2021 1003   PLT 61 10/12/2009 0000   MCV 91.0 11/10/2021 1003   MCH 30.2 12/04/2019 0559   MCHC 33.6 11/10/2021 1003   RDW 13.2 11/10/2021 1003   Iron Studies No results found for: "IRON",  "TIBC", "FERRITIN", "IRONPCTSAT" Lipid Panel     Component Value Date/Time   CHOL 117 07/14/2022 1001   TRIG 79.0 07/14/2022 1001   TRIG 133 02/21/2006 0954   HDL 46.60 07/14/2022 1001   CHOLHDL 3 07/14/2022 1001   VLDL 15.8 07/14/2022 1001   LDLCALC 55 07/14/2022 1001   LDLCALC 52 01/29/2020 1056   LDLDIRECT 92.0 03/04/2007 0933   Hepatic Function Panel     Component Value Date/Time   PROT 6.8 10/18/2020 1327   ALBUMIN 4.4 10/18/2020 1327   AST 14 07/14/2022 1001   ALT 11 07/14/2022 1001   ALKPHOS 78 10/18/2020 1327   BILITOT 0.6 10/18/2020 1327   BILIDIR 0.1 05/26/2009 0945   IBILI 0.2 12/06/2007 2108      Component Value Date/Time   TSH 0.36 07/14/2022 1001   Nutritional Lab Results  Component Value Date   VD25OH 47.1 11/23/2021   VD25OH 44.87 10/21/2021   VD25OH 46.9 10/05/2020     ASSESSMENT AND PLAN  TREATMENT PLAN FOR OBESITY:  Recommended Dietary Goals  Deyani is currently in the action stage of change. As such, her goal is to continue weight management plan. She has agreed to keeping a food journal and adhering to recommended goals of 1200-1300 calories and 75 grams protein.  Behavioral Intervention  We discussed the following Behavioral Modification Strategies today: increasing lean protein intake, decreasing simple carbohydrates , increasing vegetables, increasing lower glycemic fruits, increasing water intake, work on meal planning and preparation, reading food labels , keeping healthy foods at home, identifying sources and decreasing liquid calories, continue to practice mindfulness when eating, and planning for success.  Additional resources provided today: NA  Recommended Physical Activity Goals  Samreen has been advised to work up to 150 minutes of moderate intensity aerobic activity a week and strengthening exercises 2-3 times per week for cardiovascular health, weight loss maintenance and preservation of muscle mass.   She has agreed to  Continue current level of physical activity , Think about ways to increase daily physical activity and overcoming barriers to exercise, and Increase physical activity in their day and reduce sedentary time (increase NEAT).   ASSOCIATED CONDITIONS ADDRESSED TODAY  Action/Plan  Other hyperlipidemia Continue to follow up with PCP.  Continue meds as directed  SOB (shortness of breath) With exertion  Last IC was 1339 Today IC was 1066-worsening  Discussed the importance of adequate protein intake, adequate water intake and exercising.    Generalized obesity  BMI 23.0-23.9, adult      She is scheduled for labs in August    Return in about 2 months (around 12/19/2022).Marland Kitchen She was informed of the importance of frequent follow up visits to maximize her success with intensive  lifestyle modifications for her multiple health conditions.   ATTESTASTION STATEMENTS:  Reviewed by clinician on day of visit: allergies, medications, problem list, medical history, surgical history, family history, social history, and previous encounter notes.   Time spent on visit including pre-visit chart review and post-visit care and charting was 30 minutes.    Theodis Sato. Pratt Bress FNP-C

## 2022-10-23 ENCOUNTER — Ambulatory Visit: Payer: Medicare HMO | Admitting: Internal Medicine

## 2022-10-25 ENCOUNTER — Encounter: Payer: Self-pay | Admitting: Internal Medicine

## 2022-10-25 ENCOUNTER — Ambulatory Visit: Payer: Medicare HMO | Admitting: Internal Medicine

## 2022-10-25 VITALS — BP 120/76 | HR 58 | Ht 65.0 in | Wt 148.6 lb

## 2022-10-25 DIAGNOSIS — E039 Hypothyroidism, unspecified: Secondary | ICD-10-CM

## 2022-10-25 DIAGNOSIS — M810 Age-related osteoporosis without current pathological fracture: Secondary | ICD-10-CM | POA: Diagnosis not present

## 2022-10-25 LAB — VITAMIN D 25 HYDROXY (VIT D DEFICIENCY, FRACTURES): VITD: 54.29 ng/mL (ref 30.00–100.00)

## 2022-10-25 LAB — TSH: TSH: 0.89 u[IU]/mL (ref 0.35–5.50)

## 2022-10-25 LAB — T4, FREE: Free T4: 1.21 ng/dL (ref 0.60–1.60)

## 2022-10-25 MED ORDER — DENOSUMAB 60 MG/ML ~~LOC~~ SOSY
60.0000 mg | PREFILLED_SYRINGE | Freq: Once | SUBCUTANEOUS | Status: AC
Start: 2022-10-25 — End: 2022-10-25
  Administered 2022-10-25: 60 mg via SUBCUTANEOUS

## 2022-10-25 MED ORDER — LEVOTHYROXINE SODIUM 88 MCG PO TABS: 88.0000 ug | ORAL_TABLET | Freq: Every day | ORAL | 3 refills | Status: DC
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
## 2022-10-25 NOTE — Patient Instructions (Signed)
Please stop at the lab.  Please continue Prolia, vitamin D and calcium.  Continue levothyroxine 88 mcg daily.  Take the thyroid hormone every day, with water, at least 30 minutes before breakfast, separated by at least 4 hours from: - acid reflux medications - calcium - iron - multivitamins  Please come back for a follow-up appointment in 1 year.

## 2022-10-28 NOTE — Telephone Encounter (Signed)
Last Prolia inj 10/25/22 Next Prolia inj due 04/28/23

## 2022-11-05 ENCOUNTER — Telehealth: Payer: Self-pay | Admitting: Internal Medicine

## 2022-11-06 ENCOUNTER — Other Ambulatory Visit (HOSPITAL_BASED_OUTPATIENT_CLINIC_OR_DEPARTMENT_OTHER): Payer: Self-pay

## 2022-11-06 MED ORDER — HYDROCODONE-ACETAMINOPHEN 7.5-325 MG PO TABS
1.0000 | ORAL_TABLET | Freq: Three times a day (TID) | ORAL | 0 refills | Status: DC | PRN
  Filled 2022-11-06: qty 90, 30d supply, fill #0

## 2022-11-06 NOTE — Telephone Encounter (Signed)
PDMP okay, Rx sent 

## 2022-11-06 NOTE — Telephone Encounter (Signed)
Requesting: hydrocodone 7.5-325mg  Contract: 11/10/21 UDS: 11/10/21 Last Visit: 07/14/22 Next Visit: 11/15/22 Last Refill: 08/28/22 #90 and 0RF (x2)  Please Advise

## 2022-11-13 NOTE — Progress Notes (Unsigned)
77 y.o. G0P0000 Married Caucasian female here for annual exam.   She is followed for vaginal atrophy.   No GYN concerns.  Constipation.  Up at night to void.   Lost 40 pounds through [facility] with diet and exercise.  Feels good.   Patient's last menstrual period was 04/10/1998 (approximate).           Sexually active: No.  The current method of family planning is post menopausal status.    Exercising: Yes.     walking Smoker:  former  Health Maintenance: Pap:  07/27/21 neg: HR HPV neg, 01/15/19 neg History of abnormal Pap:  no MMG:  04/05/22 Breast Density Cat A, BI-RADS CAT 1 neg Colonoscopy:  10/07/21 BMD:   03/29/21  Result  osteoporotic.  On Prolia through Dr. Elvera Lennox.  TDaP:  2017 Gardasil:   no HIV: unsure Hep C: 09/08/15 neg Screening Labs:  PCP and endocrinology.   reports that she quit smoking about 18 years ago. Her smoking use included cigarettes. She started smoking about 57 years ago. She has a 38.5 pack-year smoking history. She has never used smokeless tobacco. She reports that she does not drink alcohol and does not use drugs.  Past Medical History:  Diagnosis Date   Allergy see chart   Anxiety 06/2020   back pain   Arthritis    Back pain    Back pain    Cataract    both eyes rt. 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   Fibromyalgia    see rheumatology   GERD (gastroesophageal reflux disease)    PAST HX    High blood pressure    High cholesterol    Hyperlipidemia    Hypothyroidism    Joint pain    Migraine    Ocular rosacea 03/2009   on doxy   Osteoporosis    osteopenia   Pleuritic chest pain    etiology unclear, has beeb eval by pulmonary and rheumatology (autoimmune dz?)   Swelling of lower extremity    Wrist fracture 2018     Past Surgical History:  Procedure Laterality Date   ANTERIOR LAT LUMBAR FUSION N/A 12/03/2019   Procedure: ANTERIOR LATERAL LUMBAR FUSION L2-4;  Surgeon: Venita Lick, MD;  Location: [facility] OR;  Service: Orthopedics;  Laterality: N/A;  4 hrs   COLONOSCOPY     COSMETIC SURGERY  04/12/2018   L upper eyelid ptosis repair, and trichophytic brow lift   ESOPHAGOGASTRODUODENOSCOPY (EGD) WITH PROPOFOL N/A 03/27/2013   Procedure: ESOPHAGOGASTRODUODENOSCOPY (EGD) WITH PROPOFOL;  Surgeon: Rachael Fee, MD;  Location: [facility];  Service: Endoscopy;  Laterality: N/A;  possible dil   EYE SURGERY     growth on cornea-bil.   FRACTURE SURGERY  hand 2017   plastic surgery face  2013   POLYPECTOMY     right rotator cuff repair  2010   SPINE SURGERY     TONSILLECTOMY     UPPER GASTROINTESTINAL ENDOSCOPY      Current Outpatient Medications  Medication Sig Dispense Refill   ALPRAZolam (XANAX) 0.5 MG tablet TAKE 1 TABLET BY MOUTH AT BEDTIME AS NEEDED FOR ANXIETY 30 tablet 5   aspirin 81 MG EC tablet Take 81 mg by mouth daily.  Swallow whole.     calcium carbonate (OS-CAL) 1250 (500 CA) MG chewable tablet Chew 1 tablet by mouth 2 (two) times daily.      Cholecalciferol (VITAMIN D3) 50 MCG (2000 UT) CHEW Chew 2,000 Units by mouth daily.      cycloSPORINE (RESTASIS) 0.05 % ophthalmic emulsion 1 drop 2 (two) times daily.     denosumab (PROLIA) 60 MG/ML SOSY injection Inject 60 mg into the skin every 6 (six) months. 1 mL 0   Doxepin HCl 6 MG TABS Take 1 tablet (6 mg total) by mouth at bedtime as needed. 30 tablet 0   Estradiol (YUVAFEM) 10 MCG TABS vaginal tablet PLACE 1 TABLET VAGINALLY 2 TIMES A WEEK. 8 tablet 2   fluticasone-salmeterol (ADVAIR) 100-50 MCG/ACT AEPB Inhale 1 puff into the lungs 2 (two) times daily. 60 each 5   hydrochlorothiazide (MICROZIDE) 12.5 MG capsule TAKE 1 CAPSULE BY MOUTH EVERY DAY 90 capsule 3   HYDROcodone-acetaminophen (NORCO) 7.5-325 MG tablet Take 1 tablet by mouth 3  (three) times daily as needed for moderate pain. 90 tablet 0   hydroxypropyl methylcellulose / hypromellose (ISOPTO TEARS / GONIOVISC) 2.5 % ophthalmic solution Place 1 drop into both eyes 3 (three) times daily as needed for dry eyes.     levothyroxine (SYNTHROID) 88 MCG tablet Take 1 tablet (88 mcg total) by mouth daily before breakfast. 90 tablet 3   Multiple Vitamin (MULTIVITAMIN) capsule Take 1 capsule by mouth daily.       naloxone (NARCAN) 4 MG/0.1ML LIQD nasal spray kit Use as directed 2 each 0   omeprazole (PRILOSEC) 40 MG capsule One pill 20-23 min before breakfast meal daily. 90 capsule 3   potassium chloride (KLOR-CON M) 10 MEQ tablet Take 1 tablet (10 mEq total) by mouth daily. 10 tablet 0   pravastatin (PRAVACHOL) 20 MG tablet Take 1 tablet (20 mg total) by mouth at bedtime. 90 tablet 1   vitamin E 1000 UNIT capsule      No current facility-administered medications for this visit.    Family History  Adopted: Yes  Problem Relation Age of Onset   Cancer Neg Hx    Depression Neg Hx    Diabetes Neg Hx    Stroke Neg Hx    Hypertension Neg Hx    Heart disease Neg Hx     Review of Systems  All other systems reviewed and are negative.   Exam:   BP 124/80 (BP Location: Left Arm, Patient Position: Sitting, Cuff Size: Normal)   Pulse 67   Ht 5' 4.5" (1.638 m)   Wt 146 lb (66.2 kg)   LMP 04/10/1998 (Approximate)   SpO2 97%   BMI 24.67 kg/m     General appearance: alert, cooperative and appears stated age Head: normocephalic, without obvious abnormality, atraumatic Neck: no adenopathy, supple, symmetrical, trachea midline and thyroid normal to inspection and palpation Lungs: clear to auscultation bilaterally Breasts: normal appearance, no masses or tenderness, No nipple retraction or dimpling, No nipple discharge or bleeding, No axillary adenopathy Heart: regular rate and rhythm Abdomen: soft, non-tender; no masses, no organomegaly Extremities: extremities normal,  atraumatic, no cyanosis or edema Skin: skin color, texture, turgor normal. No rashes or lesions Lymph nodes: cervical, supraclavicular, and axillary nodes normal. Neurologic: grossly normal  Pelvic: External genitalia:  no lesions              No abnormal inguinal nodes palpated.  Urethra:  normal appearing urethra with no masses, tenderness or lesions              Bartholins and Skenes: normal                 Vagina: normal appearing vagina with normal color and discharge, no lesions              Cervix: no lesions              Pap taken:  no Bimanual Exam:  Uterus:  normal size, contour, position, consistency, mobility, non-tender              Adnexa: no mass, fullness, tenderness              Rectal exam: yes.  Confirms.              Anus:  normal sphincter tone, no lesions  Chaperone was present for exam:  Warren Lacy, CMA  Assessment:   Well woman visit with gynecologic exam. Vaginal atrophy. Medication monitoring encounter.  Osteoporosis.  On Prolia.  Followed by endocrinology.  Plan: Mammogram screening discussed. Self breast awareness reviewed. Pap and HR HPV 2028. Guidelines for Calcium, Vitamin D, regular exercise program including cardiovascular and weight bearing exercise. Refill of Vagifem.  I discussed potential effect on breast cancer.  Follow up annually and prn.   20 min  total time was spent for this patient encounter, including preparation, face-to-face counseling with the patient, coordination of care, and documentation of the encounter in addition to doing breast and pelvic exam.

## 2022-11-15 ENCOUNTER — Encounter: Payer: Self-pay | Admitting: Internal Medicine

## 2022-11-15 ENCOUNTER — Ambulatory Visit (INDEPENDENT_AMBULATORY_CARE_PROVIDER_SITE_OTHER): Payer: Medicare HMO | Admitting: Internal Medicine

## 2022-11-15 VITALS — BP 104/66 | HR 67 | Temp 98.2°F | Ht 65.0 in | Wt 146.0 lb

## 2022-11-15 DIAGNOSIS — J438 Other emphysema: Secondary | ICD-10-CM

## 2022-11-15 DIAGNOSIS — Z0001 Encounter for general adult medical examination with abnormal findings: Secondary | ICD-10-CM

## 2022-11-15 DIAGNOSIS — G4700 Insomnia, unspecified: Secondary | ICD-10-CM | POA: Diagnosis not present

## 2022-11-15 DIAGNOSIS — R7303 Prediabetes: Secondary | ICD-10-CM

## 2022-11-15 DIAGNOSIS — R600 Localized edema: Secondary | ICD-10-CM | POA: Diagnosis not present

## 2022-11-15 DIAGNOSIS — Z0000 Encounter for general adult medical examination without abnormal findings: Secondary | ICD-10-CM

## 2022-11-15 LAB — BASIC METABOLIC PANEL
BUN: 15 mg/dL (ref 6–23)
CO2: 33 mEq/L — ABNORMAL HIGH (ref 19–32)
Calcium: 9.1 mg/dL (ref 8.4–10.5)
Chloride: 98 mEq/L (ref 96–112)
Creatinine, Ser: 0.8 mg/dL (ref 0.40–1.20)
GFR: 71.42 mL/min (ref >60.00)
Glucose, Bld: 83 mg/dL (ref 70–99)
Potassium: 3.4 mEq/L — ABNORMAL LOW (ref 3.5–5.1)
Sodium: 140 mEq/L (ref 135–145)

## 2022-11-15 LAB — CBC WITH DIFFERENTIAL/PLATELET
Basophils Absolute: 0 10*3/uL (ref 0.0–0.1)
Basophils Relative: 0.6 % (ref 0.0–3.0)
Eosinophils Absolute: 0.1 10*3/uL (ref 0.0–0.7)
Eosinophils Relative: 1.9 % (ref 0.0–5.0)
HCT: 41.5 % (ref 36.0–46.0)
Hemoglobin: 13.5 g/dL (ref 12.0–15.0)
Lymphocytes Relative: 26.3 % (ref 12.0–46.0)
Lymphs Abs: 1.6 10*3/uL (ref 0.7–4.0)
MCHC: 32.4 g/dL (ref 30.0–36.0)
MCV: 93.3 fl (ref 78.0–100.0)
Monocytes Absolute: 0.6 10*3/uL (ref 0.1–1.0)
Monocytes Relative: 8.9 % (ref 3.0–12.0)
Neutro Abs: 3.9 10*3/uL (ref 1.4–7.7)
Neutrophils Relative %: 62.3 % (ref 43.0–77.0)
Platelets: 241 10*3/uL (ref 150.0–400.0)
RBC: 4.45 Mil/uL (ref 3.87–5.11)
RDW: 13.6 % (ref 11.5–15.5)
WBC: 6.2 10*3/uL (ref 4.0–10.5)

## 2022-11-15 LAB — HEMOGLOBIN A1C: Hgb A1c MFr Bld: 5.5 % (ref 4.6–6.5)

## 2022-11-15 MED ORDER — DOXEPIN HCL 3 MG PO TABS: 3.0000 mg | ORAL_TABLET | Freq: Every evening | ORAL | 0 refills | Status: DC | PRN

## 2022-11-15 NOTE — Patient Instructions (Addendum)
Vaccines I recommend  RSV.  COVID booster if not done recently.  Flu shot (fall)  Insomnia: Stop Xanax Try doxepin 1 or 2 tablets at bedtime.  This is a low dose, we could go up if needed. Good sleep habits, see below.   GO TO THE LAB : Get the blood work     GO TO THE FRONT DESK, PLEASE SCHEDULE YOUR APPOINTMENTS Come back for a checkup in 6 months.  HEALTHY SLEEP Sleep hygiene: Basic rules for a good night's sleep  Sleep only as much as you need to feel rested and then get out of bed  Keep a regular sleep schedule  Avoid forcing sleep  Exercise regularly for at least 20 minutes, preferably 4 to 5 hours before bedtime  Avoid caffeinated beverages after lunch  Avoid alcohol near bedtime: no "night cap"  Avoid smoking, especially in the evening  Do not go to bed hungry  Adjust bedroom environment  Avoid prolonged use of light-emitting screens before bedtime   Deal with your worries before bedtime

## 2022-11-15 NOTE — Assessment & Plan Note (Signed)
Here for CPX Hyperglycemia: Check A1c, doing great with diet Insomnia: See comments on the HPI, hold Xanax, trial with doxepin which could help better. COPD: On Wixela, she used to take a different inhaler and like it  better, we talked about Trelegy, she elected to stay on Wixela for now. Edema, on diuretics, last potassium low, check labs. RTC 6 months

## 2022-11-15 NOTE — Assessment & Plan Note (Signed)
Here for CPX - Td 08-2015 - Pneumonia shot 2006 and 2013;  prevnar 2015; PNM 20: 2023 - s/p zostavax, s/p  shingrex -Vaccines I recommend -- RSV.  COVID booster if not done recently.  Flu shot (fall) --Cscope 11-2005:  polyps-adenomatous, repeated  colonoscopy 11/2008 , 04-2012, 04/2017, + polyps, cscope 08/2020, next per  GI -- Female care per gyn, MMG 03-2022, Pap  07-2021 (K PN) --Palpable aorta-- US neg for AAA 07-2012 -- Diet: [facility], making great progress. --Labs: BMP CBC A1c -Lung cancer screening,  did not  qualify  -- advance directives: filed

## 2022-11-15 NOTE — Progress Notes (Signed)
Subjective:    Patient ID: [name], female    DOB: Oct 24, 1945, 77 y.o.   MRN: [id number]  DOS:  11/15/2022 Type of visit - description: CPX  Here for CPX, chronic medical problems addressed.  On Wixela for COPD, although she does not like it as much as previous inhaler, has minimal cough, no shortness of breath with ADLs or when she walks a dog.  Insomnia: She has a monitor that tells her that out of the 8 hours at nighttime she gets restless  up to 1.5 hours at night No snoring, she feels rested in the mornings once she is awake for an hour. Change insomnia medication?Marland Kitchen  Would like A1c to recheck  Left ear ache for 3 days, no URI  Review of Systems  Other than above, a 14 point review of systems is negative     Past Medical History:  Diagnosis Date   Allergy see chart   Anxiety 06/2020   back pain   Arthritis    Back pain    Back pain    Cataract    both eyes rt. 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   Fibromyalgia    see rheumatology   GERD (gastroesophageal reflux disease)    PAST HX    High blood pressure    High cholesterol    Hyperlipidemia    Hypothyroidism    Joint pain    Migraine    Ocular rosacea 03/2009   on doxy   Osteoporosis    osteopenia   Pleuritic chest pain    etiology unclear, has beeb eval by pulmonary and rheumatology (autoimmune dz?)   Swelling of lower extremity    Wrist fracture 2018    Past Surgical History:  Procedure Laterality Date   ANTERIOR LAT LUMBAR FUSION N/A 12/03/2019   Procedure: ANTERIOR LATERAL LUMBAR FUSION L2-4;  Surgeon: Venita Lick, MD;  Location: [facility] OR;  Service: Orthopedics;  Laterality: N/A;  4 hrs   COLONOSCOPY     COSMETIC SURGERY  04/12/2018   L upper eyelid ptosis repair, and  trichophytic brow lift   ESOPHAGOGASTRODUODENOSCOPY (EGD) WITH PROPOFOL N/A 03/27/2013   Procedure: ESOPHAGOGASTRODUODENOSCOPY (EGD) WITH PROPOFOL;  Surgeon: Rachael Fee, MD;  Location: [facility];  Service: Endoscopy;  Laterality: N/A;  possible dil   EYE SURGERY     growth on cornea-bil.   FRACTURE SURGERY  hand 2017   plastic surgery face  2013   POLYPECTOMY     right rotator cuff repair  2010   SPINE SURGERY     TONSILLECTOMY     UPPER GASTROINTESTINAL ENDOSCOPY     Social History   Social History Narrative   No biological children       Current Outpatient Medications  Medication Instructions   ALPRAZolam (XANAX) 0.5 MG tablet TAKE 1 TABLET BY MOUTH AT BEDTIME AS NEEDED FOR ANXIETY   aspirin EC 81 mg, Oral, Daily, Swallow whole.   calcium carbonate (OS-CAL) 1250 (500 CA) MG chewable tablet 1 tablet, Oral, 2 times daily   cycloSPORINE (RESTASIS) 0.05 % ophthalmic emulsion 1 drop, 2 times daily   denosumab (PROLIA) 60 mg, Subcutaneous, Every 6 months   Estradiol (YUVAFEM) 10 MCG TABS vaginal  tablet PLACE 1 TABLET VAGINALLY 2 TIMES A WEEK.   fluticasone-salmeterol (ADVAIR) 100-50 MCG/ACT AEPB 1 puff, Inhalation, 2 times daily   hydrochlorothiazide (MICROZIDE) 12.5 MG capsule Oral, Daily   HYDROcodone-acetaminophen (NORCO) 7.5-325 MG tablet 1 tablet, Oral, 3 times daily PRN   HYDROcodone-acetaminophen (NORCO) 7.5-325 MG tablet 1 tablet, Oral, 3 times daily PRN   hydroxypropyl methylcellulose / hypromellose (ISOPTO TEARS / GONIOVISC) 2.5 % ophthalmic solution 1 drop, Both Eyes, 3 times daily PRN   levothyroxine (SYNTHROID) 88 mcg, Oral, Daily before breakfast   Magnesium 300 MG CAPS Take by mouth.   Multiple Vitamin (MULTIVITAMIN) capsule 1 capsule, Oral, Daily   naloxone (NARCAN) 4 MG/0.1ML LIQD nasal spray kit Use as directed   omeprazole (PRILOSEC) 40 MG capsule One pill 20-23 min before breakfast meal daily.   potassium chloride (KLOR-CON M) 10 MEQ tablet 10 mEq, Oral,  Daily   pravastatin (PRAVACHOL) 20 mg, Oral, Daily at bedtime   Vitamin D3 2,000 Units, Oral, Daily   vitamin E 1000 UNIT capsule        Objective:   Physical Exam BP 104/66 (BP Location: Left Arm, Patient Position: Sitting, Cuff Size: Normal)   Pulse 67   Temp 98.2 F (36.8 C) (Oral)   Ht 5\' 5"  (1.651 m)   Wt 146 lb (66.2 kg)   LMP 04/10/1998 (Approximate)   SpO2 100%   BMI 24.30 kg/m  General: Well developed, NAD, BMI noted Neck: No  thyromegaly  HEENT:  Normocephalic . Face symmetric, atraumatic.  TMs normal bilaterally. Lungs:  CTA B Normal respiratory effort, no intercostal retractions, no accessory muscle use. Heart: RRR,  no murmur.  Abdomen:  Not distended, soft, non-tender. No rebound or rigidity.   Lower extremities: no pretibial edema bilaterally  Skin: Exposed areas without rash. Not pale. Not jaundice Neurologic:  alert & oriented X3.  Speech normal, gait appropriate for age and unassisted Strength symmetric and appropriate for age.  Psych: Cognition and judgment appear intact.  Cooperative with normal attention span and concentration.  Behavior appropriate. No anxious or depressed appearing.     Assessment     Assessment Hypothyroidism Hyperlipidemia: Started meds 09/2019 Lower extremity edema: On HCTZ GERD Migraines insmonia-- xanax prn COPD: per chest CTs, former heavy smoker, quit 2005 [facility]- [facility] mngmt  -- back pain, 2017 had a MRI, saw Dr Cleophas Dunker, Rx local injection, PT --h/o fibromyalgia --H/o Pleuritic chest pain, on-off, resolved  --spinal stenosis (MRI 05/2018)  Osteopenia:  Per ENDO --T score 2011   -2.6 (intolerant to fosamax, ok w/ Boniva x years) --T score 01-2013   -2.6 --T score 02-2015  -2.3, rx ca and cit D  Glaucoma Ocular Rosacea 2010 CV: LAD  artery disease  per CT scan of the chest, CP:  admitted, stress lest low risk 05-2015.  Control CV RF CAD, aortic sclerosis: Per CT Chronic hoarseness: ENT OV 04-2022, RTC  prn  PLAN: Here for CPX - Td 08-2015 - Pneumonia shot 2006 and 2013;  prevnar 2015; PNM 20: 2023 - s/p zostavax, s/p  shingrex -Vaccines I recommend -- RSV.  COVID booster if not done recently.  Flu shot (fall) --Cscope 11-2005:  polyps-adenomatous, repeated  colonoscopy 11/2008 , 04-2012, 04/2017, + polyps, cscope 08/2020, next per  GI -- Female care per gyn, [facility] 03-2022, Pap  07-2021 ([facility]) --Palpable aorta-- US neg for AAA 07-2012 -- Diet: [facility], making great progress. --Labs: BMP CBC A1c -Lung cancer screening,  did not  qualify  --  advance directives: filed  Hyperglycemia: Check A1c, doing great with diet Insomnia: See comments on the HPI, hold Xanax, trial with doxepin which could help better. COPD: On Wixela, she used to take a different inhaler and like it  better, we talked about Trelegy, she elected to stay on Wixela for now. Edema, on diuretics, last potassium low, check labs. RTC 6 months

## 2022-11-17 ENCOUNTER — Other Ambulatory Visit: Payer: Self-pay | Admitting: Internal Medicine

## 2022-11-17 NOTE — Telephone Encounter (Signed)
Pharmacy comment: Alternative Requested:NO TCOVERED FOR UP TO 2 TABS PER DAY, CAN YOU SEND IN 2 SEPARATE PRESCRIPTIONS? ONE FOR A 3 MG AND ONE FOR THE 6 MG? OTHERWISE $100 OUT OF [place].

## 2022-11-20 ENCOUNTER — Encounter: Payer: Self-pay | Admitting: Internal Medicine

## 2022-11-20 MED ORDER — DOXEPIN HCL 6 MG PO TABS: 6.0000 mg | ORAL_TABLET | Freq: Every evening | ORAL | 0 refills | Status: DC | PRN

## 2022-11-20 NOTE — Telephone Encounter (Signed)
Advised to start with doxepin 6 mg, if needed she can take 2 tablets at nighttime

## 2022-11-27 ENCOUNTER — Encounter: Payer: Self-pay | Admitting: Obstetrics and Gynecology

## 2022-11-27 ENCOUNTER — Ambulatory Visit (INDEPENDENT_AMBULATORY_CARE_PROVIDER_SITE_OTHER): Payer: Medicare HMO | Admitting: Obstetrics and Gynecology

## 2022-11-27 VITALS — BP 124/80 | HR 67 | Ht 64.5 in | Wt 146.0 lb

## 2022-11-27 DIAGNOSIS — Z9189 Other specified personal risk factors, not elsewhere classified: Secondary | ICD-10-CM

## 2022-11-27 DIAGNOSIS — Z5181 Encounter for therapeutic drug level monitoring: Secondary | ICD-10-CM | POA: Diagnosis not present

## 2022-11-27 DIAGNOSIS — N952 Postmenopausal atrophic vaginitis: Secondary | ICD-10-CM

## 2022-11-27 DIAGNOSIS — Z01419 Encounter for gynecological examination (general) (routine) without abnormal findings: Secondary | ICD-10-CM

## 2022-11-27 MED ORDER — ESTRADIOL 10 MCG VA TABS: ORAL_TABLET | VAGINAL | 3 refills | Status: DC

## 2022-11-27 NOTE — Patient Instructions (Signed)

## 2022-12-01 ENCOUNTER — Other Ambulatory Visit: Payer: Self-pay | Admitting: Gastroenterology

## 2022-12-01 NOTE — Telephone Encounter (Signed)
Dr Leone Payor,  This is a patient of Dr Christella Hartigan.  Please advise if OK to refill omeprazole as you are DOD am.  Thank you

## 2022-12-01 NOTE — Telephone Encounter (Signed)
I refilled it for her  If she is doing well on it (assume so) she can ask PCP to do more refills and see [place] prn

## 2022-12-03 ENCOUNTER — Telehealth: Payer: Self-pay | Admitting: Internal Medicine

## 2022-12-03 ENCOUNTER — Encounter: Payer: Self-pay | Admitting: Internal Medicine

## 2022-12-04 ENCOUNTER — Other Ambulatory Visit (HOSPITAL_BASED_OUTPATIENT_CLINIC_OR_DEPARTMENT_OTHER): Payer: Self-pay

## 2022-12-04 MED ORDER — HYDROCODONE-ACETAMINOPHEN 7.5-325 MG PO TABS
1.0000 | ORAL_TABLET | Freq: Three times a day (TID) | ORAL | 0 refills | Status: DC | PRN
Start: 2022-12-04 — End: 2023-01-02
  Filled 2022-12-04: qty 90, 30d supply, fill #0

## 2022-12-04 MED ORDER — HYDROCODONE-ACETAMINOPHEN 7.5-325 MG PO TABS
1.0000 | ORAL_TABLET | Freq: Three times a day (TID) | ORAL | 0 refills | Status: DC | PRN
  Filled 2022-12-04 – 2023-01-04 (×2): qty 90, 30d supply, fill #0

## 2022-12-04 NOTE — Telephone Encounter (Signed)
PDMP okay, Rx sent 

## 2022-12-04 NOTE — Telephone Encounter (Signed)
Requesting: hydrocodone 7.5-325mg   Contract: 11/23/22 UDS: 11/10/21 Last Visit: 11/15/22 Next Visit:  05/21/23 Last Refill: 11/06/22 #90 and 0RF  Please Advise

## 2022-12-05 ENCOUNTER — Other Ambulatory Visit (HOSPITAL_BASED_OUTPATIENT_CLINIC_OR_DEPARTMENT_OTHER): Payer: Self-pay

## 2022-12-05 ENCOUNTER — Ambulatory Visit (INDEPENDENT_AMBULATORY_CARE_PROVIDER_SITE_OTHER): Payer: Medicare HMO | Admitting: Internal Medicine

## 2022-12-05 ENCOUNTER — Encounter: Payer: Self-pay | Admitting: Internal Medicine

## 2022-12-05 VITALS — BP 116/62 | HR 62 | Temp 97.5°F | Resp 16 | Ht 64.5 in | Wt 146.0 lb

## 2022-12-05 DIAGNOSIS — G4700 Insomnia, unspecified: Secondary | ICD-10-CM

## 2022-12-05 MED ORDER — ZOLPIDEM TARTRATE 10 MG PO TABS
5.0000 mg | ORAL_TABLET | Freq: Every evening | ORAL | 0 refills | Status: DC | PRN
Start: 2022-12-05 — End: 2023-01-04
  Filled 2022-12-05: qty 30, 30d supply, fill #0

## 2022-12-05 NOTE — Patient Instructions (Addendum)
Stop doxepin Start Ambien (zolpidem): Take half tablet when you go to bed, you can take an additional half tablet if you wake in the middle of the night. Is okay to take pain medication if you wake up in the middle of the night with back pain Be sure you have a good sleep environment Do not drink much fluids after your dinner No screen time at least 2-hour before bedtime Please see the recommendation of good sleep habits below.   HEALTHY SLEEP Sleep hygiene: Basic rules for a good night's sleep  Sleep only as much as you need to feel rested and then get out of bed  Keep a regular sleep schedule  Avoid forcing sleep  Exercise regularly for at least 20 minutes, preferably 4 to 5 hours before bedtime  Avoid caffeinated beverages after lunch  Avoid alcohol near bedtime: no "night cap"  Avoid smoking, especially in the evening  Do not go to bed hungry  Adjust bedroom environment  Avoid prolonged use of light-emitting screens before bedtime   Deal with your worries before bedtime     Vaccines I recommend: Covid booster- new this fall Flu shot this fall RSV vaccine

## 2022-12-05 NOTE — Progress Notes (Unsigned)
Subjective:    Patient ID: [name], female    DOB: 10/07/1945, 77 y.o.   MRN: [id number]  DOS:  12/05/2022 Type of visit - description: Acute  Here for insomnia management. The factors that are affecting her sleep and back pain nocturia without any other LUTS.  Review of Systems See above   Past Medical History:  Diagnosis Date   Allergy see chart   Anxiety 06/2020   back pain   Arthritis    Back pain    Back pain    Cataract    both eyes rt. 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   Fibromyalgia    see rheumatology   GERD (gastroesophageal reflux disease)    PAST HX    High blood pressure    High cholesterol    Hyperlipidemia    Hypothyroidism    Joint pain    Migraine    Ocular rosacea 03/2009   on doxy   Osteoporosis    osteopenia   Pleuritic chest pain    etiology unclear, has beeb eval by pulmonary and rheumatology (autoimmune dz?)   Swelling of lower extremity    Wrist fracture 2018    Past Surgical History:  Procedure Laterality Date   ANTERIOR LAT LUMBAR FUSION N/A 12/03/2019   Procedure: ANTERIOR LATERAL LUMBAR FUSION L2-4;  Surgeon: Venita Lick, MD;  Location: [facility];  Service: Orthopedics;  Laterality: N/A;  4 hrs   COLONOSCOPY     COSMETIC SURGERY  04/12/2018   L upper eyelid ptosis repair, and trichophytic brow lift   ESOPHAGOGASTRODUODENOSCOPY (EGD) WITH PROPOFOL N/A 03/27/2013   Procedure: ESOPHAGOGASTRODUODENOSCOPY (EGD) WITH PROPOFOL;  Surgeon: Rachael Fee, MD;  Location: [facility];  Service: Endoscopy;  Laterality: N/A;  possible dil   EYE SURGERY     growth on cornea-bil.   FRACTURE SURGERY  hand 2017   plastic surgery face  2013   POLYPECTOMY     right rotator cuff repair  2010   SPINE SURGERY     TONSILLECTOMY      UPPER GASTROINTESTINAL ENDOSCOPY      Current Outpatient Medications  Medication Instructions   ALPRAZolam (XANAX) 0.5 MG tablet TAKE 1 TABLET BY MOUTH AT BEDTIME AS NEEDED FOR ANXIETY   aspirin EC 81 mg, Oral, Daily, Swallow whole.   calcium carbonate (OS-CAL) 1250 (500 CA) MG chewable tablet 1 tablet, Oral, 2 times daily   cycloSPORINE (RESTASIS) 0.05 % ophthalmic emulsion 1 drop, 2 times daily   denosumab (PROLIA) 60 mg, Subcutaneous, Every 6 months   Estradiol (YUVAFEM) 10 MCG TABS vaginal tablet PLACE 1 TABLET VAGINALLY 2 TIMES A WEEK.   fluticasone-salmeterol (ADVAIR) 100-50 MCG/ACT AEPB 1 puff, Inhalation, 2 times daily   hydrochlorothiazide (MICROZIDE) 12.5 MG capsule Oral, Daily   HYDROcodone-acetaminophen (NORCO) 7.5-325 MG tablet 1 tablet, Oral, 3 times daily PRN   HYDROcodone-acetaminophen (NORCO) 7.5-325 MG tablet 1 tablet, Oral, 3 times daily PRN   hydroxypropyl methylcellulose / hypromellose (ISOPTO TEARS / GONIOVISC) 2.5 % ophthalmic solution 1 drop, Both Eyes, 3 times daily PRN   levothyroxine (SYNTHROID) 88 mcg, Oral, Daily before breakfast   Multiple Vitamin (MULTIVITAMIN) capsule 1 capsule, Oral, Daily   naloxone (NARCAN) 4 MG/0.1ML LIQD  nasal spray kit Use as directed   omeprazole (PRILOSEC) 40 MG capsule TAKE 1 CAPSULE 30 MIN BEFORE BREAKFAST MEAL DAILY.   potassium chloride (KLOR-CON M) 10 MEQ tablet 10 mEq, Oral, Daily   pravastatin (PRAVACHOL) 20 mg, Oral, Daily at bedtime   Vitamin D3 2,000 Units, Oral, Daily   vitamin E 1000 UNIT capsule    zolpidem (AMBIEN) 5-10 mg, Oral, At bedtime PRN       Objective:   Physical Exam BP 116/62   Pulse 62   Temp (!) 97.5 F (36.4 C) (Oral)   Resp 16   Ht 5' 4.5" (1.638 m)   Wt 146 lb (66.2 kg)   LMP 04/10/1998 (Approximate)   SpO2 98%   BMI 24.67 kg/m  General:   Well developed, NAD, BMI noted. HEENT:  Normocephalic . Face symmetric, atraumatic   Skin: Not pale. Not jaundice Neurologic:  alert &  oriented X3.  Speech normal, gait appropriate for age and unassisted Psych--  Cognition and judgment appear intact.  Cooperative with normal attention span and concentration.  Behavior appropriate. No anxious or depressed appearing.      Assessment     Assessment Hypothyroidism Hyperlipidemia: Started meds 09/2019 Lower extremity edema: On HCTZ GERD Migraines insmonia-- xanax prn COPD: per chest CTs, former heavy smoker, quit 2005 [facility]- Pain m[facility]  -- back pain, 2017 had a MRI, saw Dr Cleophas Dunker, Rx local injection, PT --h/o fibromyalgia --H/o Pleuritic chest pain, on-off, resolved  --spinal stenosis (MRI 05/2018)  Osteopenia:  Per ENDO --T score 2011   -2.6 (intolerant to fosamax, ok w/ Boniva x years) --T score 01-2013   -2.6 --T score 02-2015  -2.3, rx ca and cit D  Glaucoma Ocular Rosacea 2010 CV: LAD  artery disease  per CT scan of the chest, CP:  admitted, stress lest low risk 05-2015.  Control CV RF CAD, aortic sclerosis: Per CT Chronic hoarseness: ENT OV 04-2022, RTC prn  PLAN: Insomnia: Chronic issue, for years, has been taking Xanax for a while with suboptimal results. Admits to nocturia but also she drinks plenty of fluids before bedtime. Chronic back pain wakes her up at times. Multiple options discussed, reports that Ambien worked well for her before. Plan: PDMP okay, Ambien prescription sent.  This will instructions for the patient: Stop doxepin Start Ambien (zolpidem): Take half tablet when you go to bed, you can take an additional half tablet if you wake in the middle of the night. Is okay to take pain medication if you wake up in the middle of the night with back pain Be sure you have a good sleep environment Do not drink much fluids after your dinner No screen time at least 2-hour before bedtime Please see the recommendation of good sleep habits below.

## 2022-12-06 NOTE — Assessment & Plan Note (Signed)
Insomnia: Chronic issue, for years, has been taking Xanax for a while with suboptimal results. Admits to nocturia but also she drinks plenty of fluids before bedtime. Chronic back pain wakes her up at times. Multiple options discussed, reports that Ambien worked well for her before. Plan: PDMP okay, Ambien prescription sent.  This will instructions for the patient: Stop doxepin Start Ambien (zolpidem): Take half tablet when you go to bed, you can take an additional half tablet if you wake in the middle of the night. Is okay to take pain medication if you wake up in the middle of the night with back pain Be sure you have a good sleep environment Do not drink much fluids after your dinner No screen time at least 2-hour before bedtime Please see the recommendation of good sleep habits below.

## 2022-12-18 ENCOUNTER — Encounter: Payer: Self-pay | Admitting: Nurse Practitioner

## 2022-12-18 ENCOUNTER — Ambulatory Visit (INDEPENDENT_AMBULATORY_CARE_PROVIDER_SITE_OTHER): Payer: Medicare HMO | Admitting: Nurse Practitioner

## 2022-12-18 VITALS — BP 112/73 | HR 65 | Temp 97.9°F | Ht 65.0 in | Wt 143.0 lb

## 2022-12-18 DIAGNOSIS — Z6823 Body mass index (BMI) 23.0-23.9, adult: Secondary | ICD-10-CM

## 2022-12-18 DIAGNOSIS — E669 Obesity, unspecified: Secondary | ICD-10-CM

## 2022-12-18 DIAGNOSIS — R7303 Prediabetes: Secondary | ICD-10-CM

## 2022-12-18 NOTE — Progress Notes (Signed)
Office: [phone]  /  Fax: [phone]  WEIGHT SUMMARY AND BIOMETRICS  Weight Lost Since Last Visit: 1 lb  Weight Gained Since Last Visit: 0   Vitals Temp: 97.9 F (36.6 C) BP: 112/73 Pulse Rate: 65 SpO2: 99 %   Anthropometric Measurements Height: 5\' 5"  (1.651 m) Weight: 143 lb (64.9 kg) BMI (Calculated): 23.8 Weight at Last Visit: 144 lb Weight Lost Since Last Visit: 1 lb Weight Gained Since Last Visit: 0 Starting Weight: 184 lb Total Weight Loss (lbs): 41 lb (18.6 kg)   Body Composition  Body Fat %: 36.4 % Fat Mass (lbs): 52.2 lbs Muscle Mass (lbs): 86.4 lbs Total Body Water (lbs): 61.4 lbs Visceral Fat Rating : 10   Other Clinical Data Fasting: No Labs: No Today's Visit #: 13     HPI  Chief Complaint: OBESITY  [name] is here to discuss her progress with her obesity treatment plan. She is on the the [facility] and states she is following her eating plan approximately 60 % of the time. She states she is exercising by walking for 30 minutes 5 days per week.   Interval History:  Since last office visit she has lost 1 pound.  She is eating 3 meals and 1 snack daily. She is eating protein with 2 meals per day.  Lunch:  peanut butter crackers or cheese with crackers.  Snacks:  biscotti.  She is working on drinking more water.    Her highest weight was 200 lbs.    Pharmacotherapy for weight loss: She is not currently taking medications  for medical weight loss.   Previous pharmacotherapy for medical weight loss:  none  Bariatric surgery:  Patient has not had bariatric surgery   Prediabetes Last A1c was 5.5-looked better  Medication(s): not on meds Polyphagia:No Lab Results  Component Value Date   HGBA1C 5.5 11/15/2022   HGBA1C 5.7 (H) 11/23/2021   HGBA1C 5.8 03/24/2021   HGBA1C 5.5 05/22/2015   Lab Results  Component Value Date   INSULIN 8.7 11/23/2021    PHYSICAL EXAM:  Blood pressure 112/73, pulse 65, temperature 97.9 F  (36.6 C), height 5\' 5"  (1.651 m), weight 143 lb (64.9 kg), last menstrual period 04/10/1998, SpO2 99%. Body mass index is 23.8 kg/m.  General: She is overweight, cooperative, alert, well developed, and in no acute distress. PSYCH: Has normal mood, affect and thought process.   Extremities: No edema.  Neurologic: No gross sensory or motor deficits. No tremors or fasciculations noted.    DIAGNOSTIC DATA REVIEWED:  BMET    Component Value Date/Time   NA 140 11/15/2022 0906   NA 137 12/16/2020 1505   K 3.4 (L) 11/15/2022 0906   CL 98 11/15/2022 0906   CO2 33 (H) 11/15/2022 0906   GLUCOSE 83 11/15/2022 0906   GLUCOSE 129 10/12/2009 0000   BUN 15 11/15/2022 0906   BUN 10 12/16/2020 1505   CREATININE 0.80 11/15/2022 0906   CREATININE 0.79 01/29/2020 1056   CALCIUM 9.1 11/15/2022 0906   GFRNONAA >60 12/04/2019 0559   GFRAA >60 12/04/2019 0559   Lab Results  Component Value Date   HGBA1C 5.5 11/15/2022   HGBA1C 5.5 05/22/2015   Lab Results  Component Value Date   INSULIN 8.7 11/23/2021   Lab Results  Component Value Date   TSH 0.89 10/25/2022   CBC    Component Value Date/Time   WBC 6.2 11/15/2022 0906   RBC 4.45 11/15/2022 0906   HGB 13.5 11/15/2022 0906  HCT 41.5 11/15/2022 0906   PLT 241.0 11/15/2022 0906   PLT 61 10/12/2009 0000   MCV 93.3 11/15/2022 0906   MCH 30.2 12/04/2019 0559   MCHC 32.4 11/15/2022 0906   RDW 13.6 11/15/2022 0906   Iron Studies No results found for: "IRON", "TIBC", "FERRITIN", "IRONPCTSAT" Lipid Panel     Component Value Date/Time   CHOL 117 07/14/2022 1001   TRIG 79.0 07/14/2022 1001   TRIG 133 02/21/2006 0954   HDL 46.60 07/14/2022 1001   CHOLHDL 3 07/14/2022 1001   VLDL 15.8 07/14/2022 1001   LDLCALC 55 07/14/2022 1001   LDLCALC 52 01/29/2020 1056   LDLDIRECT 92.0 03/04/2007 0933   Hepatic Function Panel     Component Value Date/Time   PROT 6.8 10/18/2020 1327   ALBUMIN 4.4 10/18/2020 1327   AST 14 07/14/2022 1001    ALT 11 07/14/2022 1001   ALKPHOS 78 10/18/2020 1327   BILITOT 0.6 10/18/2020 1327   BILIDIR 0.1 05/26/2009 0945   IBILI 0.2 12/06/2007 2108      Component Value Date/Time   TSH 0.89 10/25/2022 1031   Nutritional Lab Results  Component Value Date   VD25OH 54.29 10/25/2022   VD25OH 47.1 11/23/2021   VD25OH 44.87 10/21/2021     ASSESSMENT AND PLAN  TREATMENT PLAN FOR OBESITY:  Recommended Dietary Goals  Karolee is currently in the action stage of change. As such, her goal is to continue weight maintenance. She has agreed to the [employer].    Behavioral Intervention  We discussed the following Behavioral Modification Strategies today: increasing lean protein intake, decreasing simple carbohydrates , increasing vegetables, increasing lower glycemic fruits, increasing water intake, work on meal planning and preparation, reading food labels , keeping healthy foods at home, continue to practice mindfulness when eating, and planning for success.  Additional resources provided today: NA  Recommended Physical Activity Goals  Neyda has been advised to work up to 150 minutes of moderate intensity aerobic activity a week and strengthening exercises 2-3 times per week for cardiovascular health, weight loss maintenance and preservation of muscle mass.   She has agreed to Continue current level of physical activity , Think about ways to increase daily physical activity and overcoming barriers to exercise, and Increase physical activity in their day and reduce sedentary time (increase NEAT).   ASSOCIATED CONDITIONS ADDRESSED TODAY  Action/Plan  Prediabetes Last A1c looked better.  Will continue to monitor  Lachrisha will continue to work on exercise and decreasing simple carbohydrates to help decrease the risk of diabetes.    Generalized obesity  BMI 23.0-23.9, adult      She is scheduled for a DEXA in December.     Return in about 8 weeks (around 02/12/2023).Marland Kitchen She was  informed of the importance of frequent follow up visits to maximize her success with intensive lifestyle modifications for her multiple health conditions.   ATTESTASTION STATEMENTS:  Reviewed by clinician on day of visit: allergies, medications, problem list, medical history, surgical history, family history, social history, and previous encounter notes.   Time spent on visit including pre-visit chart review and post-visit care and charting was 30 minutes.    Theodis Sato. Hartlee Amedee FNP-C

## 2022-12-26 ENCOUNTER — Telehealth: Payer: Self-pay | Admitting: Physician Assistant

## 2022-12-26 NOTE — Telephone Encounter (Signed)
Inbound call from. Patient confirmed 9/24 appointment. 12/11 appointment has been cancelled.

## 2022-12-26 NOTE — Telephone Encounter (Signed)
Inbound call from patient stating she recently started experiencing diarrhea and sever abdominal pain. Patient is scheduled for next available 12/11. Patient requesting a call back to discuss what could be done in the meantime. Please advise, thank you.

## 2022-12-26 NOTE — Telephone Encounter (Signed)
Noted  

## 2022-12-26 NOTE — Telephone Encounter (Signed)
Pt scheduled to see Alcide Evener NP 01/02/23 at 9:30am. Left message for pt regarding appt and requested she call back to let us know if she can keep this appt.

## 2022-12-27 ENCOUNTER — Ambulatory Visit (INDEPENDENT_AMBULATORY_CARE_PROVIDER_SITE_OTHER): Payer: Medicare HMO | Admitting: Internal Medicine

## 2022-12-27 ENCOUNTER — Encounter: Payer: Self-pay | Admitting: Internal Medicine

## 2022-12-27 VITALS — BP 122/68 | HR 64 | Temp 98.1°F | Resp 16 | Ht 65.0 in | Wt 145.4 lb

## 2022-12-27 DIAGNOSIS — R109 Unspecified abdominal pain: Secondary | ICD-10-CM

## 2022-12-27 DIAGNOSIS — R197 Diarrhea, unspecified: Secondary | ICD-10-CM | POA: Diagnosis not present

## 2022-12-27 NOTE — Patient Instructions (Addendum)
Instructions for today.  You probably have enteritis.  This most likely will continue to resolve by itself.  Follow-up bland diet: Chicken soup, crackers, some fruits if they do not upset you. Gradually increase your diet.  Keep the appointment with a gastroenterologist next week.  In the meantime, if you get worse, have fever, increasing stomach pain, blood in the stools, the pain goes to the right side of the abdomen: Go to the ER.  Vaccines I recommend: Covid booster- new this fall Flu shot this fall RSV vaccine

## 2022-12-27 NOTE — Progress Notes (Unsigned)
Subjective:    Patient ID: [name], female    DOB: [birth year], 77 y.o.   MRN: [id number]  DOS:  12/27/2022 Type of visit - description: Acute visit  Symptoms started 6 days ago suddenly: Violent diarrhea.  Stools were watery at first and then soft.  No blood in the stools, no unusual colors. Diarrhea gradually resolved and is now having normal bowel movements.  She also developed mid abdominal pain, described as sharp, not burning, not crampy. Pain increases with eating. Lately she has been able to tolerate cantaloupe and grapes.  Denies fever or chills. No GERD symptoms. No recent NSAIDs.  Wt Readings from Last 3 Encounters:  12/27/22 145 lb 6 oz (65.9 kg)  12/18/22 143 lb (64.9 kg)  12/05/22 146 lb (66.2 kg)     Review of Systems See above   Past Medical History:  Diagnosis Date   Allergy see chart   Anxiety 06/2020   back pain   Arthritis    Back pain    Back pain    Cataract    both eyes rt. 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   Fibromyalgia    see rheumatology   GERD (gastroesophageal reflux disease)    PAST HX    High blood pressure    High cholesterol    Hyperlipidemia    Hypothyroidism    Joint pain    Migraine    Ocular rosacea 03/2009   on doxy   Osteoporosis    osteopenia   Pleuritic chest pain    etiology unclear, has beeb eval by pulmonary and rheumatology (autoimmune dz?)   Swelling of lower extremity    Wrist fracture 2018    Past Surgical History:  Procedure Laterality Date   ANTERIOR LAT LUMBAR FUSION N/A 12/03/2019   Procedure: ANTERIOR LATERAL LUMBAR FUSION L2-4;  Surgeon: Venita Lick, MD;  Location: [facility];  Service: Orthopedics;  Laterality: N/A;  4 hrs   COLONOSCOPY     COSMETIC SURGERY  04/12/2018   L  upper eyelid ptosis repair, and trichophytic brow lift   ESOPHAGOGASTRODUODENOSCOPY (EGD) WITH PROPOFOL N/A 03/27/2013   Procedure: ESOPHAGOGASTRODUODENOSCOPY (EGD) WITH PROPOFOL;  Surgeon: Rachael Fee, MD;  Location: [facility];  Service: Endoscopy;  Laterality: N/A;  possible dil   EYE SURGERY     growth on cornea-bil.   FRACTURE SURGERY  hand 2017   plastic surgery face  2013   POLYPECTOMY     right rotator cuff repair  2010   SPINE SURGERY     TONSILLECTOMY     UPPER GASTROINTESTINAL ENDOSCOPY      Current Outpatient Medications  Medication Instructions   ALPRAZolam (XANAX) 0.5 MG tablet TAKE 1 TABLET BY MOUTH AT BEDTIME AS NEEDED FOR ANXIETY   aspirin EC 81 mg, Daily   calcium carbonate (OS-CAL) 1250 (500 CA) MG chewable tablet 1 tablet, 2 times daily   cycloSPORINE (RESTASIS) 0.05 % ophthalmic emulsion 1 drop, 2 times daily   denosumab (PROLIA) 60 mg, Subcutaneous, Every 6 months   Estradiol (YUVAFEM) 10 MCG TABS vaginal tablet PLACE 1 TABLET VAGINALLY 2 TIMES A WEEK.   fluticasone-salmeterol (ADVAIR) 100-50 MCG/ACT AEPB 1 puff, Inhalation, 2 times daily   hydrochlorothiazide (  MICROZIDE) 12.5 MG capsule Oral, Daily   HYDROcodone-acetaminophen (NORCO) 7.5-325 MG tablet 1 tablet, Oral, 3 times daily PRN   HYDROcodone-acetaminophen (NORCO) 7.5-325 MG tablet 1 tablet, Oral, 3 times daily PRN   hydroxypropyl methylcellulose / hypromellose (ISOPTO TEARS / GONIOVISC) 2.5 % ophthalmic solution 1 drop, 3 times daily PRN   levothyroxine (SYNTHROID) 88 mcg, Oral, Daily before breakfast   Multiple Vitamin (MULTIVITAMIN) capsule 1 capsule, Daily   naloxone (NARCAN) 4 MG/0.1ML LIQD nasal spray kit Use as directed   omeprazole (PRILOSEC) 40 MG capsule TAKE 1 CAPSULE 30 MIN BEFORE BREAKFAST MEAL DAILY.   potassium chloride (KLOR-CON M) 10 MEQ tablet 10 mEq, Oral, Daily   pravastatin (PRAVACHOL) 20 mg, Oral, Daily at bedtime   Vitamin D3 2,000 Units, Daily   vitamin E 1000 UNIT capsule     zolpidem (AMBIEN) 5-10 mg, Oral, At bedtime PRN       Objective:   Physical Exam Abdominal:       Comments: Mild diffuse tenderness mostly at the upper left side.  See graphic. No mass, no rebound    BP 122/68   Pulse 64   Temp 98.1 F (36.7 C) (Oral)   Resp 16   Ht 5\' 5"  (1.651 m)   Wt 145 lb 6 oz (65.9 kg)   LMP 04/10/1998 (Approximate)   SpO2 98%   BMI 24.19 kg/m  General:   Well developed, NAD, BMI noted.  HEENT:  Normocephalic . Face symmetric, atraumatic.  Not pale Lungs:  CTA B Normal respiratory effort, no intercostal retractions, no accessory muscle use. Heart: RRR,  no murmur.  Abdomen:  Not distended, soft, tender, see graphic.  Palpable aorta. Not tender at McBurney's, no tenderness to deep right upper quadrant palpation. Skin: Not pale. Not jaundice Lower extremities: no pretibial edema bilaterally  Neurologic:  alert & oriented X3.  Speech normal, gait appropriate for age and unassisted Psych--  Cognition and judgment appear intact.  Cooperative with normal attention span and concentration.  Behavior appropriate. No anxious or depressed appearing.     Assessment     Assessment Hypothyroidism Hyperlipidemia: Started meds 09/2019 Lower extremity edema: On HCTZ GERD Migraines insmonia-- xanax prn COPD: per chest CTs, former heavy smoker, quit 2005 [facility]- [facility]  -- back pain, 2017 had a MRI, saw Dr Cleophas Dunker, Rx local injection, PT --h/o fibromyalgia --H/o Pleuritic chest pain, on-off, resolved  --spinal stenosis (MRI 05/2018)  Osteopenia:  Per ENDO --T score 2011   -2.6 (intolerant to fosamax, ok w/ Boniva x years) --T score 01-2013   -2.6 --T score 02-2015  -2.3, rx ca and cit D  Glaucoma Ocular Rosacea 2010 CV: LAD  artery disease  per CT scan of the chest, CP:  admitted, stress lest low risk 05-2015.  Control CV RF CAD, aortic sclerosis: Per CT Chronic hoarseness: ENT OV 04-2022, RTC prn  PLAN: Abdominal pain, diarrhea: Sudden  onset 6 days ago, diarrhea improving, abdominal exam with some tenderness but otherwise negative. Chart reviewed Abdominal ultrasound 2022: Normal aorta, no gallbladder stones. Had a EGD 10/07/2021: EGD and biopsy negative Suspect this is a self-limited enteritis, plan: Good hydration, bland diet, ER if red flags, see AVS. Keep appointment with GI next week. She is holding some of her medications, okay to go back on them gradually.   8-27 Insomnia: Chronic issue, for years, has been taking Xanax for a while with suboptimal results. Admits to nocturia but also she drinks plenty of fluids before bedtime.  Insomnia chronic  Chronic back pain wakes her up at times. Multiple options discussed, reports that Ambien worked well for her before. Plan: PDMP okay, Ambien prescription sent.  This will instructions for the patient: Stop doxepin Start Ambien (zolpidem): Take half tablet when you go to bed, you can take an additional half tablet if you wake in the middle of the night. Is okay to take pain medication if you wake up in the middle of the night with back pain Be sure you have a good sleep environment Do not drink much fluids after your dinner No screen time at least 2-hour before bedtime Please see the recommendation of good sleep habits below.

## 2022-12-28 NOTE — Assessment & Plan Note (Signed)
Abdominal pain, diarrhea: Sudden onset 6 days ago, diarrhea improving, abdominal exam with some tenderness but otherwise negative. Chart reviewed Abdominal ultrasound 2022: Normal aorta, no gallbladder stones. Had a EGD 10/07/2021: EGD and biopsy negative Suspect this is a self-limited enteritis, plan: Good hydration, bland diet, ER if red flags, see AVS. Keep appointment with GI next week. She is holding some of her medications, okay to go back on them gradually.

## 2023-01-02 ENCOUNTER — Other Ambulatory Visit (INDEPENDENT_AMBULATORY_CARE_PROVIDER_SITE_OTHER): Payer: Medicare HMO

## 2023-01-02 ENCOUNTER — Ambulatory Visit: Payer: Medicare HMO | Admitting: Nurse Practitioner

## 2023-01-02 ENCOUNTER — Encounter: Payer: Self-pay | Admitting: Nurse Practitioner

## 2023-01-02 VITALS — BP 118/70 | HR 61 | Ht 65.0 in | Wt 142.0 lb

## 2023-01-02 DIAGNOSIS — R197 Diarrhea, unspecified: Secondary | ICD-10-CM | POA: Diagnosis not present

## 2023-01-02 DIAGNOSIS — R1010 Upper abdominal pain, unspecified: Secondary | ICD-10-CM

## 2023-01-02 LAB — CBC WITH DIFFERENTIAL/PLATELET
Basophils Absolute: 0 10*3/uL (ref 0.0–0.1)
Basophils Relative: 0.7 % (ref 0.0–3.0)
Eosinophils Absolute: 0.1 10*3/uL (ref 0.0–0.7)
Eosinophils Relative: 1 % (ref 0.0–5.0)
HCT: 41.9 % (ref 36.0–46.0)
Hemoglobin: 13.7 g/dL (ref 12.0–15.0)
Lymphocytes Relative: 21.4 % (ref 12.0–46.0)
Lymphs Abs: 1.2 10*3/uL (ref 0.7–4.0)
MCHC: 32.6 g/dL (ref 30.0–36.0)
MCV: 93.4 fl (ref 78.0–100.0)
Monocytes Absolute: 0.6 10*3/uL (ref 0.1–1.0)
Monocytes Relative: 10 % (ref 3.0–12.0)
Neutro Abs: 3.8 10*3/uL (ref 1.4–7.7)
Neutrophils Relative %: 66.9 % (ref 43.0–77.0)
Platelets: 248 10*3/uL (ref 150.0–400.0)
RBC: 4.49 Mil/uL (ref 3.87–5.11)
RDW: 13.4 % (ref 11.5–15.5)
WBC: 5.7 10*3/uL (ref 4.0–10.5)

## 2023-01-02 LAB — C-REACTIVE PROTEIN: CRP: <1 mg/dL (ref 0.5–20.0)

## 2023-01-02 NOTE — Progress Notes (Signed)
01/02/2023 [name] [id number] 05/05/1945   Chief Complaint: Diarrhea, abdominal pain   History of Present Illness: [name] is a 77 year old female with a past medical history of anxiety, fibromyalgia, osteoporosis, vitamin D deficiency, hypertension, hyperlipidemia, COPD, hypothyroidism, GERD, diverticulosis and colon polyps. Previously known by Dr. Christella Hartigan. She presents today for further evaluation regarding abdominal pain and diarrhea. On 12/22/2022, she developed extreme epigastric pain which radiated down to to the central lower abdomen with explosive nonbloody diarrhea which occurred 2 to 3 times daily. Eating worsened her abdominal pain. She saw her PCP on Tuesday 12/26/2022 who recommended supportive care. She continues to have mud like stools once daily, sometimes soft stools, no watery diarrhea. She often does not feel emptied after passing a BM and has to wipe a lot. She initially went on a bland diet and is slowly advancing her diet. Her abdominal pain has lessened. No fevers. No antibiotic use within the past few months. She underwent an EGD 10/08/2019 which shoed chronic nonspecific esophagitis. She underwent a colonoscopy 09/03/2020 which showed one 3mm tubular adenomatous polyp removed from the ascending colon and diverticulosis to the left colon.      Latest Ref Rng & Units 11/15/2022    9:06 AM 11/10/2021   10:03 AM 03/24/2021   11:40 AM  CBC  WBC 4.0 - 10.5 K/uL 6.2  6.8  7.5   Hemoglobin 12.0 - 15.0 g/dL 38.7  56.4  33.2   Hematocrit 36.0 - 46.0 % 41.5  40.7  40.7   Platelets 150.0 - 400.0 K/uL 241.0  281.0  266.0        Latest Ref Rng & Units 11/15/2022    9:06 AM 07/14/2022   10:01 AM 11/10/2021   10:03 AM  CMP  Glucose 70 - 99 mg/dL 83  85  89   BUN 6 - 23 mg/dL 15  13  14    Creatinine 0.40 - 1.20 mg/dL 9.51  8.84  1.66   Sodium 135 - 145 mEq/L 140  141  141   Potassium 3.5 - 5.1 mEq/L 3.4  3.1  3.5   Chloride 96 - 112 mEq/L 98  100  101   CO2 19 - 32  mEq/L 33  32  32   Calcium 8.4 - 10.5 mg/dL 9.1  9.2  9.2   AST 0 - 37 U/L  14    ALT 0 - 35 U/L  11      MOST RECENT GI PROCEDURES:  EGD 10/07/2021 by Dr. Christella Hartigan: - Normal esophagus.  - Normal stomach.  - Normal examined duodenum.  - Biopsies were taken with a cold forceps from the proximal and distal esophagus to check for Eosinophilic Esophagitis. 1. Surgical [P], distal esophagus - MILD CHRONIC NONSPECIFIC ESOPHAGITIS - NEGATIVE FOR INCREASED INTRAEPITHELIAL EOSINOPHILS 2. Surgical [P], proximal 1 esophagus - MILD CHRONIC NONSPECIFIC ESOPHAGITIS - NEGATIVE FOR INCREASED INTRAEPITHELIAL EOSINOPHILS  Colonoscopy 09/03/2020 by Dr. Christella Hartigan: - One 3 mm polyp in the ascending colon, removed with a cold snare. Resected and retrieved.  - Diverticulosis in the left colon.  - The examination was otherwise normal on direct and retroflexion views.  - 5 year recall colonoscopy  - TUBULAR ADENOMA - NEGATIVE FOR HIGH-GRADE DYSPLASIA OR MALIGNANCY - MULTIPLE STEP SECTIONS WERE EXAMINED  Past Medical History:  Diagnosis Date   Allergy see chart   Anxiety 06/2020   back pain   Arthritis    Back pain    Back  pain    Cataract    both eyes rt. 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   Fibromyalgia    see rheumatology   GERD (gastroesophageal reflux disease)    PAST HX    High blood pressure    High cholesterol    Hyperlipidemia    Hypothyroidism    Joint pain    Migraine    Ocular rosacea 03/2009   on doxy   Osteoporosis    osteopenia   Pleuritic chest pain    etiology unclear, has beeb eval by pulmonary and rheumatology (autoimmune dz?)   Swelling of lower extremity    Wrist fracture 2018   Past Surgical History:  Procedure Laterality Date   ANTERIOR LAT LUMBAR  FUSION N/A 12/03/2019   Procedure: ANTERIOR LATERAL LUMBAR FUSION L2-4;  Surgeon: Venita Lick, MD;  Location: [facility] OR;  Service: Orthopedics;  Laterality: N/A;  4 hrs   COLONOSCOPY     COSMETIC SURGERY  04/12/2018   L upper eyelid ptosis repair, and trichophytic brow lift   ESOPHAGOGASTRODUODENOSCOPY (EGD) WITH PROPOFOL N/A 03/27/2013   Procedure: ESOPHAGOGASTRODUODENOSCOPY (EGD) WITH PROPOFOL;  Surgeon: Rachael Fee, MD;  Location: [facility];  Service: Endoscopy;  Laterality: N/A;  possible dil   EYE SURGERY     growth on cornea-bil.   FRACTURE SURGERY  hand 2017   plastic surgery face  2013   POLYPECTOMY     right rotator cuff repair  2010   SPINE SURGERY     TONSILLECTOMY     UPPER GASTROINTESTINAL ENDOSCOPY     Current Outpatient Medications on File Prior to Visit  Medication Sig Dispense Refill   cycloSPORINE (RESTASIS) 0.05 % ophthalmic emulsion 1 drop 2 (two) times daily.     denosumab (PROLIA) 60 MG/ML SOSY injection Inject 60 mg into the skin every 6 (six) months. 1 mL 0   Estradiol (YUVAFEM) 10 MCG TABS vaginal tablet PLACE 1 TABLET VAGINALLY 2 TIMES A WEEK. 24 tablet 3   fluticasone-salmeterol (ADVAIR) 100-50 MCG/ACT AEPB Inhale 1 puff into the lungs 2 (two) times daily. 60 each 5   hydrochlorothiazide (MICROZIDE) 12.5 MG capsule TAKE 1 CAPSULE BY MOUTH EVERY DAY 90 capsule 3   HYDROcodone-acetaminophen (NORCO) 7.5-325 MG tablet Take 1 tablet by mouth 3 (three) times daily as needed for moderate pain. 90 tablet 0   hydroxypropyl methylcellulose / hypromellose (ISOPTO TEARS / GONIOVISC) 2.5 % ophthalmic solution Place 1 drop into both eyes 3 (three) times daily as needed for dry eyes.     levothyroxine (SYNTHROID) 88 MCG tablet Take 1 tablet (88 mcg total) by mouth daily before breakfast. 90 tablet 3   naloxone (NARCAN) 4 MG/0.1ML LIQD nasal spray kit Use as directed 2 each 0   omeprazole (PRILOSEC) 40 MG capsule TAKE 1 CAPSULE 30 MIN BEFORE BREAKFAST MEAL DAILY. 90  capsule 3   zolpidem (AMBIEN) 10 MG tablet Take 0.5-1 tablets (5-10 mg total) by mouth at bedtime as needed for sleep. 30 tablet 0   ALPRAZolam (XANAX) 0.5 MG tablet TAKE 1 TABLET BY MOUTH AT BEDTIME AS NEEDED FOR ANXIETY (Patient not taking: Reported on 12/27/2022) 30 tablet 5   aspirin 81 MG EC tablet Take 81 mg by mouth daily.  Swallow whole. (Patient not taking: Reported on 12/27/2022)     calcium carbonate (OS-CAL) 1250 (500 CA) MG chewable tablet Chew 1 tablet by mouth 2 (two) times daily.  (Patient not taking: Reported on 12/27/2022)     Cholecalciferol (VITAMIN D3) 50 MCG (2000 UT) CHEW Chew 2,000 Units by mouth daily.  (Patient not taking: Reported on 12/27/2022)     Multiple Vitamin (MULTIVITAMIN) capsule Take 1 capsule by mouth daily.   (Patient not taking: Reported on 12/27/2022)     potassium chloride (KLOR-CON M) 10 MEQ tablet Take 1 tablet (10 mEq total) by mouth daily. (Patient not taking: Reported on 12/27/2022) 10 tablet 0   pravastatin (PRAVACHOL) 20 MG tablet Take 1 tablet (20 mg total) by mouth at bedtime. (Patient not taking: Reported on 12/27/2022) 90 tablet 1   vitamin E 1000 UNIT capsule  (Patient not taking: Reported on 12/27/2022)     No current facility-administered medications on file prior to visit.   Allergies  Allergen Reactions   Alendronate Sodium Hives   Bactrim [Sulfamethoxazole-Trimethoprim]     Upset stomach   Contrast Media [Iodinated Contrast Media] Hives   Iodine Hives   Milnacipran     REACTION: increased bp, increased hr   Penicillins Hives and Other (See Comments)    REACTION: urticaria (hives)   Prednisone Rash   Current Medications, Allergies, Past Medical History, Past Surgical History, Family History and Social History were reviewed in [employer] record.  Review of Systems:   Constitutional: Negative for fever, sweats, chills or weight loss.  Respiratory: Negative for shortness of breath.   Cardiovascular: Negative for  chest pain, palpitations and leg swelling.  Gastrointestinal: See HPI.  Musculoskeletal: Negative for back pain or muscle aches.  Neurological: Negative for dizziness, headaches or paresthesias.    Physical Exam: BP 118/70   Pulse 61   Ht 5\' 5"  (1.651 m)   Wt 142 lb (64.4 kg)   LMP 04/10/1998 (Approximate)   BMI 23.63 kg/m   General: 77 year old female in no acute distress. Head: Normocephalic and atraumatic. Eyes: No scleral icterus. Conjunctiva pink . Ears: Normal auditory acuity. Mouth: Dentition intact. No ulcers or lesions.  Lungs: Clear throughout to auscultation. Heart: Regular rate and rhythm, no murmur. Abdomen: Soft, nondistended. Mild tenderness to the epigastric and RLQ without rebound or guarding. No masses or hepatomegaly. Normal bowel sounds x 4 quadrants.  Rectal: Deferred.  Musculoskeletal: Symmetrical with no gross deformities. Extremities: No edema. Neurological: Alert oriented x 4. No focal deficits.  Psychological: Alert and cooperative. Normal mood and affect  Assessment and Recommendations:  77 year old female with acute presumed infectious diarrhea with epigastric pain which radiated to the central lower abdomen, lingering symptoms. Possible post infectious IBS. -[facility] GI panel to include C. Diff -CBC, CRP -Advance diet as tolerated  -Benefiber 1 tablespoon daily to bulk up stool -Famotidine 20mg  every day -CTAP with abdominal pain persists or worsens   GERD -See plan above   History of colon polyps. One small tubular adenomatous polyp removed from the ascending colon per colonoscopy 08/2020. -Next colonoscopy due 08/2025 if medically appropriate as patient will be 77 years old at that time

## 2023-01-02 NOTE — Patient Instructions (Addendum)
Your provider has ordered "[facility]" stool testing for you. You have received a kit from our office today containing all necessary supplies to complete this test. Please carefully read the stool collection instructions provided in the kit before opening the accompanying materials. In addition, be sure to place the label from the top left corner of the laboratory request sheet onto the "puritan opti-swab" tube that is supplied in the kit. This label should include your full name and date of birth. After completing the test, you should secure the purtian tube into the specimen biohazard bag. The laboratory request information sheet (including date and time of specimen collection) should be placed into the outside pocket of the specimen biohazard bag and returned to the [facility] lab with 2 days of collection.   Benefiber- take 1 tablespoon daily as tolerated to bulk up stool  Contact our office if your symptoms worsen.  May take Pepcid 20 mg (over the counter)- 1 tablet daily as needed  Due to recent changes in healthcare laws, you may see the results of your imaging and laboratory studies on MyChart before your provider has had a chance to review them.  We understand that in some cases there may be results that are confusing or concerning to you. Not all laboratory results come back in the same time frame and the provider may be waiting for multiple results in order to interpret others.  Please give [place] 48 hours in order for your provider to thoroughly review all the results before contacting the office for clarification of your results.   Thank you for trusting me with your gastrointestinal care!   Alcide Evener, CRNP

## 2023-01-03 NOTE — Progress Notes (Signed)
Attending Physician's Attestation   I have reviewed the chart.   I agree with the Advanced Practitioner's note, impression, and recommendations with any updates as below.    Emersyn Wyss Mansouraty, MD [facility] Office # [phone]  

## 2023-01-04 ENCOUNTER — Other Ambulatory Visit (HOSPITAL_BASED_OUTPATIENT_CLINIC_OR_DEPARTMENT_OTHER): Payer: Self-pay

## 2023-01-04 ENCOUNTER — Encounter: Payer: Self-pay | Admitting: Physician Assistant

## 2023-01-04 ENCOUNTER — Telehealth: Payer: Self-pay | Admitting: Internal Medicine

## 2023-01-04 ENCOUNTER — Ambulatory Visit: Payer: Medicare HMO | Admitting: Physician Assistant

## 2023-01-04 VITALS — BP 118/68 | HR 68 | Temp 98.2°F | Resp 16 | Ht 65.0 in | Wt 143.2 lb

## 2023-01-04 DIAGNOSIS — H6502 Acute serous otitis media, left ear: Secondary | ICD-10-CM | POA: Diagnosis not present

## 2023-01-04 MED ORDER — HYDROCODONE-ACETAMINOPHEN 7.5-325 MG PO TABS
1.0000 | ORAL_TABLET | Freq: Three times a day (TID) | ORAL | 0 refills | Status: DC | PRN
  Filled 2023-01-04: qty 90, 30d supply, fill #0

## 2023-01-04 MED ORDER — ZOLPIDEM TARTRATE 10 MG PO TABS
5.0000 mg | ORAL_TABLET | Freq: Every evening | ORAL | 0 refills | Status: DC | PRN
  Filled 2023-01-04: qty 30, 30d supply, fill #0

## 2023-01-04 MED ORDER — LORATADINE 10 MG PO TABS
10.0000 mg | ORAL_TABLET | Freq: Every day | ORAL | 11 refills | Status: DC
Start: 2023-01-04 — End: 2023-02-15
  Filled 2023-01-04: qty 30, 30d supply, fill #0

## 2023-01-04 MED ORDER — FLUTICASONE PROPIONATE 50 MCG/ACT NA SUSP
2.0000 | Freq: Every day | NASAL | 6 refills | Status: DC
Start: 2023-01-04 — End: 2023-08-28
  Filled 2023-01-04: qty 16, 30d supply, fill #0

## 2023-01-04 NOTE — Telephone Encounter (Signed)
Refilled by Lillia Abed today during viist.

## 2023-01-04 NOTE — Progress Notes (Signed)
Established patient visit   Patient: [name]   DOB: November 25, 1945   77 y.o. Female  MRN: [id number] Visit Date: 01/04/2023  Today's healthcare provider: Alfredia Ferguson, PA-C   Cc. Left ear pain  Subjective    HPI   Pt reports left sided ear pain, slight left sided head/sinus tenderness/pressure x 10 days. Denies fever, chills, cough.   Medications: Outpatient Medications Prior to Visit  Medication Sig   ALPRAZolam (XANAX) 0.5 MG tablet TAKE 1 TABLET BY MOUTH AT BEDTIME AS NEEDED FOR ANXIETY   aspirin 81 MG EC tablet Take 81 mg by mouth daily. Swallow whole.   calcium carbonate (OS-CAL) 1250 (500 CA) MG chewable tablet Chew 1 tablet by mouth 2 (two) times daily.   Cholecalciferol (VITAMIN D3) 50 MCG (2000 UT) CHEW Chew 2,000 Units by mouth daily.   cycloSPORINE (RESTASIS) 0.05 % ophthalmic emulsion 1 drop 2 (two) times daily.   denosumab (PROLIA) 60 MG/ML SOSY injection Inject 60 mg into the skin every 6 (six) months.   Estradiol (YUVAFEM) 10 MCG TABS vaginal tablet PLACE 1 TABLET VAGINALLY 2 TIMES A WEEK.   fluticasone-salmeterol (ADVAIR) 100-50 MCG/ACT AEPB Inhale 1 puff into the lungs 2 (two) times daily.   hydrochlorothiazide (MICROZIDE) 12.5 MG capsule TAKE 1 CAPSULE BY MOUTH EVERY DAY   hydroxypropyl methylcellulose / hypromellose (ISOPTO TEARS / GONIOVISC) 2.5 % ophthalmic solution Place 1 drop into both eyes 3 (three) times daily as needed for dry eyes.   levothyroxine (SYNTHROID) 88 MCG tablet Take 1 tablet (88 mcg total) by mouth daily before breakfast.   Multiple Vitamin (MULTIVITAMIN) capsule Take 1 capsule by mouth daily.   omeprazole (PRILOSEC) 40 MG capsule TAKE 1 CAPSULE 30 MIN BEFORE BREAKFAST MEAL DAILY.   potassium chloride (KLOR-CON M) 10 MEQ tablet Take 1 tablet (10 mEq total) by mouth daily.   pravastatin (PRAVACHOL) 20 MG tablet Take 1 tablet (20 mg total) by mouth at bedtime.   vitamin E 1000 UNIT capsule    [DISCONTINUED]  HYDROcodone-acetaminophen (NORCO) 7.5-325 MG tablet Take 1 tablet by mouth 3 (three) times daily as needed for moderate pain.   [DISCONTINUED] zolpidem (AMBIEN) 10 MG tablet Take 0.5-1 tablets (5-10 mg total) by mouth at bedtime as needed for sleep.   naloxone (NARCAN) 4 MG/0.1ML LIQD nasal spray kit Use as directed (Patient not taking: Reported on 01/04/2023)   No facility-administered medications prior to visit.    Review of Systems  Constitutional:  Negative for fatigue and fever.  HENT:  Positive for ear pain.   Respiratory:  Negative for cough and shortness of breath.   Cardiovascular:  Negative for chest pain and leg swelling.  Gastrointestinal:  Negative for abdominal pain.  Neurological:  Negative for dizziness and headaches.      Objective    BP 118/68   Pulse 68   Temp 98.2 F (36.8 C) (Oral)   Resp 16   Ht 5\' 5"  (1.651 m)   Wt 143 lb 4 oz (65 kg)   LMP 04/10/1998 (Approximate)   SpO2 97%   BMI 23.84 kg/m Blood pressure 118/68, pulse 68, temperature 98.2 F (36.8 C), temperature source Oral, resp. rate 16, height 5\' 5"  (1.651 m), weight 143 lb 4 oz (65 kg), last menstrual period 04/10/1998, SpO2 97%.   Physical Exam Vitals reviewed.  Constitutional:      Appearance: She is not ill-appearing.  HENT:     Head: Normocephalic.     Right Ear: Tympanic membrane  normal.     Left Ear: Tympanic membrane normal.     Ears:     Comments: L Tm with some serous fluid, but no erythema, bulging Eyes:     Conjunctiva/sclera: Conjunctivae normal.  Cardiovascular:     Rate and Rhythm: Normal rate.  Pulmonary:     Effort: Pulmonary effort is normal. No respiratory distress.  Neurological:     General: No focal deficit present.     Mental Status: She is alert and oriented to person, place, and time.  Psychiatric:        Mood and Affect: Mood normal.        Behavior: Behavior normal.      No results found for any visits on 01/04/23.  Assessment & Plan     1.  Non-recurrent acute serous otitis media of left ear Recommending antihistamines, flonase nasal sprays  - loratadine (CLARITIN) 10 MG tablet; Take 1 tablet (10 mg total) by mouth daily.  Dispense: 30 tablet; Refill: 11 - fluticasone (FLONASE) 50 MCG/ACT nasal spray; Place 2 sprays into both nostrils daily.  Dispense: 16 g; Refill: 6   Return if symptoms worsen or fail to improve.      I, Alfredia Ferguson, PA-C have reviewed all documentation for this visit. The documentation on  01/04/23   for the exam, diagnosis, procedures, and orders are all accurate and complete.    Alfredia Ferguson, PA-C  [facility] [phone] (phone) [phone] (fax)  [facility]

## 2023-01-04 NOTE — Telephone Encounter (Signed)
Requesting: Ambien 10mg   Contract:11/23/22 UDS: 11/10/21 Last Visit: 12/27/22 Next Visit: 05/21/23 Last Refill: 12/05/22 #30 and 0RF   Please Advise

## 2023-01-11 ENCOUNTER — Other Ambulatory Visit: Payer: Self-pay | Admitting: Cardiovascular Disease

## 2023-01-13 NOTE — Progress Notes (Signed)
Cardiology Office Note:    Date:  01/19/2023   ID:  TIFANI TUEY, DOB [birth year], MRN [id number]  PCP:  Wanda Plump, MD  Cardiologist:  Charlton Haws, MD   Electrophysiologist:  None   Referring MD: Wanda Plump, MD   Chief Complaint:  No chief complaint on file.    Patient Profile:    [name] is a 77 y.o. female with:  Hypothyroidism Hyperlipidemia  Migraine HAs Fibromyalgia  Chest pain, Palpitations   Evaluation in 1/17 >> Rare PVCs on tele; Myoview neg for ischemia  Ex-smoker Coronary artery Ca2+ on CT 09/2018 Myoview 12/2018: low risk  Aortic atherosclerosis COPD  Prior CV studies: Myoview 12/23/2018 EF 74, no ischemia; low risk   History of Present Illness:    77 y.o. history of hypothyroidism, fibromyalgia atypical chest pain with normal myovue in 2017.and again in September of 2020 Benign palpitations with rare PvCls on monitor She is a previous heavy smoker with COPD She has had multiple back surgeries Seen by primary and noted some LE edema with no other congestive symptoms BMET and UA where normal Started on HCTZ with improvement 10/30/21   She is from [place] lived in [place] and worked for [employer] Dependant edema improved with HCTZ she take latter in afternoon   She has lost 40 lbs using [employer]     Past Medical History:  Diagnosis Date   Allergy see chart   Anxiety 06/2020   back pain   Arthritis    Back pain    Back pain    Cataract    both eyes rt. 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 Swallow whole.   calcium carbonate (OS-CAL) 1250 (500 CA) MG chewable tablet Chew 1 tablet by mouth 2 (two) times daily.   Cholecalciferol (VITAMIN D3) 50 MCG (2000 UT) CHEW Chew 2,000 Units by mouth daily.   cycloSPORINE (RESTASIS) 0.05 % ophthalmic emulsion 1 drop 2 (two) times daily.   denosumab (PROLIA) 60 MG/ML SOSY injection Inject 60 mg into the skin every 6 (six) months.   Estradiol (YUVAFEM) 10 MCG TABS vaginal tablet PLACE 1 TABLET VAGINALLY 2 TIMES A WEEK.   fluticasone (FLONASE) 50 MCG/ACT nasal spray Place 2 sprays into both nostrils daily.   fluticasone-salmeterol (ADVAIR) 100-50 MCG/ACT AEPB Inhale 1 puff into the lungs 2 (two) times daily.   hydrochlorothiazide (MICROZIDE) 12.5 MG capsule TAKE 1 CAPSULE BY MOUTH EVERY DAY   HYDROcodone-acetaminophen (NORCO) 7.5-325 MG tablet Take 1 tablet by mouth 3 (three)  times daily as needed for moderate pain.   hydroxypropyl methylcellulose / hypromellose (ISOPTO TEARS / GONIOVISC) 2.5 % ophthalmic solution Place 1 drop into both eyes 3 (three) times daily as needed for dry eyes.   levothyroxine (SYNTHROID) 88 MCG tablet Take 1 tablet (88 mcg total) by mouth daily before breakfast.   Multiple Vitamin (MULTIVITAMIN) capsule Take 1 capsule by mouth daily.   naloxone (NARCAN) 4 MG/0.1ML LIQD nasal spray kit Use as directed   omeprazole (PRILOSEC) 40 MG capsule TAKE 1 CAPSULE 30 MIN BEFORE BREAKFAST MEAL DAILY.   potassium chloride (KLOR-CON M) 10 MEQ tablet Take 1 tablet (10 mEq total) by mouth daily.   pravastatin (PRAVACHOL) 20 MG tablet Take 1 tablet (20 mg total) by mouth at bedtime.   vitamin E 1000 UNIT capsule    zolpidem  (AMBIEN) 10 MG tablet Take 0.5-1 tablets (5-10 mg total) by mouth at bedtime as needed for sleep.     Allergies:   Alendronate sodium, Bactrim [sulfamethoxazole-trimethoprim], Contrast media [iodinated contrast media], Iodine, Milnacipran, Penicillins, and Prednisone   Social History   Tobacco Use   Smoking status: Former    Current packs/day: 0.00    Average packs/day: 1 pack/day for 38.5 years (38.5 ttl pk-yrs)    Types: Cigarettes    Start date: 07/14/1965    Quit date: 01/13/2004    Years since quitting: 19.0   Smokeless tobacco: Never   Tobacco comments:    quit 2005  Vaping Use   Vaping status: Never Used  Substance Use Topics   Alcohol use: No    Alcohol/week: 0.0 standard drinks of alcohol    Comment: rare   Drug use: No     Family Hx: The patient's family history is negative for Cancer, Depression, Diabetes, Stroke, Hypertension, and Heart disease. She was adopted.  ROS   EKGs/Labs/Other Test Reviewed:    EKG:  EKG  11/30/21 SR rate 66 nonspecific ST changes   Recent Labs: 07/14/2022: ALT 11 10/25/2022: TSH 0.89 11/15/2022: BUN 15; Creatinine, Ser 0.80; Potassium 3.4; Sodium 140 01/02/2023: Hemoglobin 13.7; Platelets 248.0   Recent Lipid Panel Lab Results  Component Value Date/Time   CHOL 117 07/14/2022 10:01 AM   TRIG 79.0 07/14/2022 10:01 AM   TRIG 133 02/21/2006 09:54 AM   HDL 46.60 07/14/2022 10:01 AM   CHOLHDL 3 07/14/2022 10:01 AM   LDLCALC 55 07/14/2022 10:01 AM   LDLCALC 52 01/29/2020 10:56 AM   LDLDIRECT 92.0 03/04/2007 09:33 AM    Physical Exam:    VS:  BP 122/76   Pulse (!) 59   Ht 5\' 5"  (1.651 m)   Wt 143 lb 3.2 oz (65 kg)   LMP 04/10/1998 (Approximate)   SpO2 97%   BMI 23.83 kg/m     Wt Readings from Last 3 Encounters:  01/19/23 143 lb 3.2 oz (65 kg)  01/04/23 143 lb 4 oz (65 kg)  01/02/23 142 lb (64.4 kg)     Affect appropriate Healthy:  appears stated age HEENT: normal Neck supple with no adenopathy JVP normal no bruits no  thyromegaly Lungs clear with no wheezing and good diaphragmatic motion Heart:  S1/S2 no murmur, no rub, gallop or click PMI normal Abdomen: benighn, BS positve, no tenderness, no AAA no bruit.  No HSM or HJR Distal pulses intact with no bruits Plus one bilateral edema Neuro non-focal Skin warm and dry No muscular weakness     ASSESSMENT & PLAN:    Chest pain: atypical normal myovue x  2 most recently 2020 observe COPD quit smoking around 2006 f/u primary regarding lung cancer CT needs  Thyroid: continue synthroid replacement TSH normal 10/25/22  HLD:  on statin labs with primary Had some LAD calcium and aortic atherosclerosis noted on lung CT 2020  LDL at goal 55 07/14/22  Edema:  dependant from venous dx and obesity HCTZ 12.5 mg daily improved Cr 0.8 and K 3.4 11/15/22 on Klor Con 10 meq  F/U in a year   Medication Adjustments/Labs and Tests Ordered: Current medicines are reviewed at length with the patient today.  Concerns regarding medicines are outlined above.  Tests Ordered: Orders Placed This Encounter  Procedures   EKG 12-Lead    Medication Changes: No orders of the defined types were placed in this encounter.    Signed, Charlton Haws, MD  01/19/2023 10:24 AM    [facility] [address] [place], [place], [place]  [address] Phone: [phone]; Fax: [phone]

## 2023-01-15 DIAGNOSIS — H6122 Impacted cerumen, left ear: Secondary | ICD-10-CM | POA: Diagnosis not present

## 2023-01-15 DIAGNOSIS — H90A32 Mixed conductive and sensorineural hearing loss, unilateral, left ear with restricted hearing on the contralateral side: Secondary | ICD-10-CM | POA: Diagnosis not present

## 2023-01-15 DIAGNOSIS — H6993 Unspecified Eustachian tube disorder, bilateral: Secondary | ICD-10-CM | POA: Diagnosis not present

## 2023-01-19 ENCOUNTER — Encounter: Payer: Self-pay | Admitting: Cardiovascular Disease

## 2023-01-19 ENCOUNTER — Ambulatory Visit: Payer: Medicare HMO | Attending: Cardiovascular Disease | Admitting: Cardiovascular Disease

## 2023-01-19 VITALS — BP 122/76 | HR 59 | Ht 65.0 in | Wt 143.2 lb

## 2023-01-19 DIAGNOSIS — R600 Localized edema: Secondary | ICD-10-CM | POA: Diagnosis not present

## 2023-01-19 DIAGNOSIS — R079 Chest pain, unspecified: Secondary | ICD-10-CM | POA: Diagnosis not present

## 2023-01-19 DIAGNOSIS — I493 Ventricular premature depolarization: Secondary | ICD-10-CM

## 2023-01-19 NOTE — Patient Instructions (Signed)

## 2023-01-29 ENCOUNTER — Telehealth: Payer: Self-pay | Admitting: Physician Assistant

## 2023-01-30 ENCOUNTER — Other Ambulatory Visit (HOSPITAL_BASED_OUTPATIENT_CLINIC_OR_DEPARTMENT_OTHER): Payer: Self-pay

## 2023-01-30 MED ORDER — HYDROCODONE-ACETAMINOPHEN 7.5-325 MG PO TABS
1.0000 | ORAL_TABLET | Freq: Three times a day (TID) | ORAL | 0 refills | Status: DC | PRN
  Filled 2023-01-30 – 2023-02-01 (×2): qty 90, 30d supply, fill #0

## 2023-01-30 MED ORDER — ZOLPIDEM TARTRATE 10 MG PO TABS
5.0000 mg | ORAL_TABLET | Freq: Every evening | ORAL | 3 refills | Status: DC | PRN
Start: 2023-01-30 — End: 2023-06-08
  Filled 2023-01-30 – 2023-02-01 (×2): qty 30, 30d supply, fill #0
  Filled 2023-03-01: qty 30, 30d supply, fill #1
  Filled 2023-03-28 – 2023-03-30 (×2): qty 30, 30d supply, fill #2
  Filled 2023-04-27: qty 30, 30d supply, fill #3

## 2023-01-30 NOTE — Telephone Encounter (Signed)
PDMP okay, Rx sent 

## 2023-01-30 NOTE — Telephone Encounter (Signed)
Requesting: hydrocodone 7.5-325mg  and Ambien 10mg   Contract: 11/23/22 UDS: 11/10/21 Last Visit: 12/27/22 Next Visit: 05/21/23 Last Refill: 01/04/23 #90 and 0RF (hydrocodone) Last Refill 01/04/23 #30 and 0RF (Ambien)  Please Advise

## 2023-02-01 ENCOUNTER — Other Ambulatory Visit (HOSPITAL_BASED_OUTPATIENT_CLINIC_OR_DEPARTMENT_OTHER): Payer: Self-pay

## 2023-02-01 MED ORDER — INFLUENZA VAC A&B SURF ANT ADJ 0.5 ML IM SUSY
0.5000 mL | PREFILLED_SYRINGE | Freq: Once | INTRAMUSCULAR | 0 refills | Status: AC
  Filled 2023-02-01: qty 0.5, 1d supply, fill #0

## 2023-02-02 ENCOUNTER — Other Ambulatory Visit: Payer: Self-pay | Admitting: Internal Medicine

## 2023-02-02 ENCOUNTER — Telehealth: Payer: Self-pay | Admitting: Internal Medicine

## 2023-02-02 DIAGNOSIS — M810 Age-related osteoporosis without current pathological fracture: Secondary | ICD-10-CM

## 2023-02-02 NOTE — Telephone Encounter (Addendum)
Patient is calling to say that it is time for her to have a bone density done and she needs orders put in to the system.  [facility] [place] [place] for imaging.

## 2023-02-05 NOTE — Telephone Encounter (Signed)
Pt has been notified and voices understanding.  

## 2023-02-12 ENCOUNTER — Other Ambulatory Visit (HOSPITAL_BASED_OUTPATIENT_CLINIC_OR_DEPARTMENT_OTHER): Payer: Self-pay | Admitting: Obstetrics and Gynecology

## 2023-02-12 DIAGNOSIS — Z1231 Encounter for screening mammogram for malignant neoplasm of breast: Secondary | ICD-10-CM

## 2023-02-15 ENCOUNTER — Encounter: Payer: Self-pay | Admitting: Bariatrics

## 2023-02-15 ENCOUNTER — Ambulatory Visit: Payer: Medicare HMO | Admitting: Bariatrics

## 2023-02-15 VITALS — BP 117/74 | HR 63 | Temp 98.1°F | Ht 65.0 in | Wt 139.0 lb

## 2023-02-15 DIAGNOSIS — Z6823 Body mass index (BMI) 23.0-23.9, adult: Secondary | ICD-10-CM

## 2023-02-15 DIAGNOSIS — R7303 Prediabetes: Secondary | ICD-10-CM | POA: Diagnosis not present

## 2023-02-15 DIAGNOSIS — E669 Obesity, unspecified: Secondary | ICD-10-CM | POA: Diagnosis not present

## 2023-02-15 NOTE — Progress Notes (Signed)
WEIGHT SUMMARY AND BIOMETRICS  Weight Lost Since Last Visit: 4lb  Weight Gained Since Last Visit: 0   Vitals Temp: 98.1 F (36.7 C) BP: 117/74 Pulse Rate: 63 SpO2: 97 %   Anthropometric Measurements Height: 5\' 5"  (1.651 m) Weight: 139 lb (63 kg) BMI (Calculated): 23.13 Weight at Last Visit: 143lb Weight Lost Since Last Visit: 4lb Weight Gained Since Last Visit: 0 Starting Weight: 184lb Total Weight Loss (lbs): 45 lb (20.4 kg)   Body Composition  Body Fat %: 35.8 % Fat Mass (lbs): 50 lbs Muscle Mass (lbs): 85.2 lbs Total Body Water (lbs): 62.4 lbs Visceral Fat Rating : 9   Other Clinical Data Fasting: no Labs: no Today's Visit #: 14 Starting Date: 11/23/21    OBESITY [name] is here to discuss her progress with her obesity treatment plan along with follow-up of her obesity related diagnoses.    Nutrition Plan: the pescatarian plan - 50% adherence.  Current exercise: walking  Interim History:  She is down another 4 lbs since her last visit.  Eating all of the food on the plan., Protein intake is as prescribed, Is not skipping meals, and Water intake is adequate. She will continue to do her journaling.    Hunger is moderately controlled.  Cravings are moderately controlled.  Assessment/Plan:   Prediabetes Last A1c was 5.5  Medication(s): none None  Lab Results  Component Value Date   HGBA1C 5.5 11/15/2022   HGBA1C 5.7 (H) 11/23/2021   HGBA1C 5.8 03/24/2021   HGBA1C 5.5 05/22/2015   Lab Results  Component Value Date   INSULIN 8.7 11/23/2021    Plan: Will minimize all refined carbohydrates both sweets and starches.  Will work on the plan and exercise.  Consider both aerobic and resistance training.  Will keep protein, water, and fiber intake high.  Aim for 7 to 9 hours of sleep nightly.  Will weight self in the am. Set a goal  for a critical weight.  Will add weights to her exercise regimen.      Generalized Obesity: Current BMI BMI (Calculated): 23.13   Original BMI 32.   [name] is currently in the action stage of change. As such, her goal is to maintain weight for now.  She has agreed to the pescatarian plan.  Exercise goals: Older adults should determine their level of effort for physical activity relative to their level of fitness.   Behavioral modification strategies: increasing lean protein intake, decreasing simple carbohydrates , no meal skipping, meal planning , better snacking choices, planning for success, increasing vegetables, avoiding temptations, and mindful eating.  [name] has agreed to follow-up with our clinic in 4 weeks.    Medications Discontinued During This Encounter  Medication Reason   ALPRAZolam (XANAX) 0.5 MG tablet Patient Preference   loratadine (CLARITIN) 10 MG tablet Patient Preference       Objective:  VITALS: Per patient if applicable, see vitals. GENERAL: Alert and in no acute distress. CARDIOPULMONARY: No increased WOB. Speaking in clear sentences.  PSYCH: Pleasant and cooperative. Speech normal rate and rhythm. Affect is appropriate. Insight and judgement are appropriate. Attention is focused, linear, and appropriate.  NEURO: Oriented as arrived to appointment on time with no prompting.   Attestation Statements:   This was prepared with the assistance of Engineer, civil (consulting).  Occasional wrong-word or sound-a-like substitutions may have occurred due to the inherent limitations of voice recognition   Michelle Capra, DO

## 2023-02-16 ENCOUNTER — Other Ambulatory Visit: Payer: Self-pay | Admitting: Internal Medicine

## 2023-02-20 DIAGNOSIS — M5451 Vertebrogenic low back pain: Secondary | ICD-10-CM | POA: Diagnosis not present

## 2023-02-27 DIAGNOSIS — H903 Sensorineural hearing loss, bilateral: Secondary | ICD-10-CM | POA: Diagnosis not present

## 2023-02-27 DIAGNOSIS — H6993 Unspecified Eustachian tube disorder, bilateral: Secondary | ICD-10-CM | POA: Diagnosis not present

## 2023-03-01 ENCOUNTER — Telehealth: Payer: Self-pay | Admitting: Internal Medicine

## 2023-03-02 ENCOUNTER — Other Ambulatory Visit: Payer: Self-pay

## 2023-03-02 ENCOUNTER — Other Ambulatory Visit (HOSPITAL_BASED_OUTPATIENT_CLINIC_OR_DEPARTMENT_OTHER): Payer: Self-pay

## 2023-03-02 MED ORDER — HYDROCODONE-ACETAMINOPHEN 7.5-325 MG PO TABS
1.0000 | ORAL_TABLET | Freq: Three times a day (TID) | ORAL | 0 refills | Status: DC | PRN
  Filled 2023-03-02: qty 90, 30d supply, fill #0

## 2023-03-02 MED ORDER — HYDROCODONE-ACETAMINOPHEN 7.5-325 MG PO TABS
1.0000 | ORAL_TABLET | Freq: Three times a day (TID) | ORAL | 0 refills | Status: DC | PRN
  Filled 2023-03-02 – 2023-03-30 (×2): qty 90, 30d supply, fill #0

## 2023-03-02 NOTE — Telephone Encounter (Signed)
Requesting: hydrocodone 7.5-325mg   Contract: 11/23/22 UDS: 11/10/21 Last Visit: 12/27/22 Next Visit: 05/21/23 Last Refill: 01/30/23 #90 and 0RF   Please Advise

## 2023-03-02 NOTE — Telephone Encounter (Signed)
PDMP okay, Rx sent 

## 2023-03-07 ENCOUNTER — Ambulatory Visit: Payer: Medicare HMO | Admitting: Orthopaedic Surgery

## 2023-03-07 ENCOUNTER — Other Ambulatory Visit (INDEPENDENT_AMBULATORY_CARE_PROVIDER_SITE_OTHER): Payer: Medicare HMO

## 2023-03-07 DIAGNOSIS — M25551 Pain in right hip: Secondary | ICD-10-CM

## 2023-03-07 NOTE — Progress Notes (Signed)
The patient is someone of seen in the past.  When I saw her last year in June I did place a steroid injection around the trochanteric area of her right hip.  With time she is starting develop some pain in her right hip around the groin area.  She fell about a month ago landing directly on her spine.  She has a history of a lumbar spine fusion and she did see her spine specialist and was told that the spine was fine and that the hip pain she was experiencing was likely related to her hip and not her spine.  She reports a weight loss of 43 pounds.  She walks without assist device.  She is 77 years old and she does state the pain is waking her up at night.  On exam there are no blocks or rotation of either hip but the right hip definitely hurts in the groin on the extremes of internal and external rotation.  She does describe having do lift that leg up using her arms and hands when she is getting in and out of cars or doing other activities due to some perceived weakness and pain with that right hip.  Again the left hip is entirely normal on exam.  Standing AP pelvis and lateral of the right hip does show slightly worsening arthritic changes that is becoming more moderate to severe with time.  There is superior lateral joint space narrowing with osteophytes around the acetabulum and lateral femoral head.  There is also an inferior osteophyte of the lateral femoral head and acetabulum.  This is worsened when compared to previous films.  The left hip has mild arthritis.  She states that she would like to try a steroid injection in her right hip.  We can have her schedule appointment with Dr. Shon Baton for that injection under ultrasound guidance.  He can then get her back to me about a month after that injection.  She did let me know that she does get a rash with prednisone and that 1 time she did have a steroid injection in her lumbar spine that did cause her to have redness in her face and a rash in her face.  She  said she can take Benadryl for that and would still like to consider steroid injection in her right hip.  We will work on getting her in with Dr. Shon Baton sometime hopefully in the near future and again he can get her back to me about a month after that injection.  She agrees with this treatment plan.  All questions and concerns were addressed and answered.

## 2023-03-21 ENCOUNTER — Ambulatory Visit: Payer: Medicare HMO | Admitting: Physician Assistant

## 2023-03-27 NOTE — Telephone Encounter (Signed)
Prolia VOB initiated via [email or web address]  Next Prolia inj DUE: 04/28/23

## 2023-03-28 ENCOUNTER — Other Ambulatory Visit: Payer: Self-pay | Admitting: Internal Medicine

## 2023-03-29 ENCOUNTER — Other Ambulatory Visit: Payer: Self-pay

## 2023-03-29 ENCOUNTER — Ambulatory Visit: Payer: Medicare HMO | Admitting: Sports Medicine

## 2023-03-29 ENCOUNTER — Other Ambulatory Visit (HOSPITAL_BASED_OUTPATIENT_CLINIC_OR_DEPARTMENT_OTHER): Payer: Self-pay

## 2023-03-29 ENCOUNTER — Encounter: Payer: Self-pay | Admitting: Sports Medicine

## 2023-03-29 DIAGNOSIS — M1611 Unilateral primary osteoarthritis, right hip: Secondary | ICD-10-CM

## 2023-03-29 DIAGNOSIS — M25551 Pain in right hip: Secondary | ICD-10-CM | POA: Diagnosis not present

## 2023-03-29 MED ORDER — LIDOCAINE HCL 1 % IJ SOLN
4.0000 mL | INTRAMUSCULAR | Status: AC | PRN
  Administered 2023-03-29: 4 mL

## 2023-03-29 MED ORDER — METHYLPREDNISOLONE ACETATE 40 MG/ML IJ SUSP
40.0000 mg | INTRAMUSCULAR | Status: AC | PRN
  Administered 2023-03-29: 40 mg via INTRA_ARTICULAR

## 2023-03-29 NOTE — Progress Notes (Signed)
Office & Procedure Note  Patient: [name]             Date of Birth: [birth year]           MRN: [id number]             Visit Date: 03/29/2023  HPI: [name] is a pleasant 77 year-old female who presents with chronic right hip pain with known osteoarthritis.  She has seen Dr. Magnus Ivan about the hip and he discussed considering a steroid injection would be advantageous. She does have a history of having some redness in her face after previous steroid injection (GT bursa), but no systemic symptoms. Has done well with Benadryl.  She also discussed working on postural changes to help bending forward when standing/walking.  PE: - Right hip: No redness swelling or effusion.  There is limited internal logroll and to a lesser degree mild restriction with external logroll.  Positive Stinchfield test.  Imaging:  XR HIP UNILAT W OR W/O PELVIS 1V RIGHT An AP pelvis and lateral right hip shows moderate to significant arthritic  findings of the right hip.  There is superior lateral joint space  narrowing.  There is osteophytes of the superior lateral acetabulum and  femoral head and inferior osteophytes of the femoral head.  There is mild  arthritic changes in the left hip on the AP view.  Visit Diagnoses:  1. Unilateral primary osteoarthritis, right hip   2. Pain of right hip    Procedures:  Large Joint Inj: R hip joint on 03/29/2023 9:23 AM Indications: pain Details: 22 G 3.5 in needle, ultrasound-guided anterior approach Medications: 4 mL lidocaine 1 %; 40 mg methylPREDNISolone acetate 40 MG/ML Outcome: tolerated well, no immediate complications  Procedure: US-guided intra-articular hip injection, Right After discussion on risks/benefits/indications and informed verbal consent was obtained, a timeout was performed. Patient was lying supine on exam table. The hip was cleaned with betadine and alcohol swabs. Then utilizing ultrasound guidance, the patient's femoral head and neck junction  was identified and subsequently injected with 4:1 lidocaine:depomedrol via an in-plane approach with ultrasound visualization of the injectate administered into the hip joint. Patient tolerated procedure well without immediate complications.     Procedure, treatment alternatives, risks and benefits explained, specific risks discussed. Consent was given by the patient. Immediately prior to procedure a time out was called to verify the correct patient, procedure, equipment, support staff and site/side marked as required. Patient was prepped and draped in the usual sterile fashion.      Plan:  -Discussed with [name] moving forward with diagnostic and hopefully therapeutic ultrasound-guided right hip injection, we did perform this today. May use ice/heat or tylenol for any post-injection pain -Evaluated her 15 minutes after her injection and she was walking with less pain and had no adverse effects.  Did recommend she can try taking Benadryl when she gets home to help prevent some of the redness/flushing she may receive -We will see what sort of benefit she gets moving forward, recommended modified rest/activity for the next 48 hours and then may return to activity.  Could benefit from PT/HEP for the hip as well as her posture, this is something she can address with me later or with Dr. Magnus Ivan when she sees him back in 1 month  Madelyn Brunner, DO Primary Care Sports Medicine Physician  [facility]  This note was dictated using Dragon naturally speaking software and may contain errors in syntax, spelling, or content which have  not been identified prior to signing this note.

## 2023-03-30 ENCOUNTER — Other Ambulatory Visit (HOSPITAL_BASED_OUTPATIENT_CLINIC_OR_DEPARTMENT_OTHER): Payer: Self-pay

## 2023-04-09 ENCOUNTER — Encounter (HOSPITAL_BASED_OUTPATIENT_CLINIC_OR_DEPARTMENT_OTHER): Payer: Self-pay

## 2023-04-09 ENCOUNTER — Ambulatory Visit (HOSPITAL_BASED_OUTPATIENT_CLINIC_OR_DEPARTMENT_OTHER)
Admission: RE | Admit: 2023-04-09 | Discharge: 2023-04-09 | Disposition: A | Discharge status: Home / Self Care | Payer: Medicare HMO | Source: Ambulatory Visit | Attending: Internal Medicine | Admitting: Internal Medicine

## 2023-04-09 ENCOUNTER — Ambulatory Visit (HOSPITAL_BASED_OUTPATIENT_CLINIC_OR_DEPARTMENT_OTHER)
Admission: RE | Admit: 2023-04-09 | Discharge: 2023-04-09 | Disposition: A | Discharge status: Home / Self Care | Payer: Medicare HMO | Source: Ambulatory Visit | Attending: Obstetrics and Gynecology | Admitting: Obstetrics and Gynecology

## 2023-04-09 DIAGNOSIS — Z1231 Encounter for screening mammogram for malignant neoplasm of breast: Secondary | ICD-10-CM

## 2023-04-09 DIAGNOSIS — M810 Age-related osteoporosis without current pathological fracture: Secondary | ICD-10-CM | POA: Diagnosis not present

## 2023-04-10 DIAGNOSIS — D1801 Hemangioma of skin and subcutaneous tissue: Secondary | ICD-10-CM | POA: Diagnosis not present

## 2023-04-10 DIAGNOSIS — L821 Other seborrheic keratosis: Secondary | ICD-10-CM | POA: Diagnosis not present

## 2023-04-15 ENCOUNTER — Other Ambulatory Visit: Payer: Self-pay | Admitting: Cardiovascular Disease

## 2023-04-17 ENCOUNTER — Other Ambulatory Visit (HOSPITAL_BASED_OUTPATIENT_CLINIC_OR_DEPARTMENT_OTHER): Payer: Self-pay

## 2023-04-17 NOTE — Telephone Encounter (Signed)
 Update insurance

## 2023-04-22 NOTE — Telephone Encounter (Signed)
 Insurance updated from [employer] to [employer]   VOB resubmitted.

## 2023-04-23 ENCOUNTER — Telehealth: Payer: Self-pay

## 2023-04-23 NOTE — Telephone Encounter (Signed)
 Patient called to update insurance information from [employer] to [employer]  ID: [id number] for Prior Auth.

## 2023-04-26 ENCOUNTER — Other Ambulatory Visit: Payer: Self-pay | Admitting: Internal Medicine

## 2023-04-27 ENCOUNTER — Telehealth: Payer: Self-pay | Admitting: Internal Medicine

## 2023-04-27 ENCOUNTER — Other Ambulatory Visit: Payer: Self-pay

## 2023-04-27 ENCOUNTER — Other Ambulatory Visit (HOSPITAL_BASED_OUTPATIENT_CLINIC_OR_DEPARTMENT_OTHER): Payer: Self-pay

## 2023-04-27 MED ORDER — HYDROCODONE-ACETAMINOPHEN 7.5-325 MG PO TABS
1.0000 | ORAL_TABLET | Freq: Three times a day (TID) | ORAL | 0 refills | Status: DC | PRN
  Filled 2023-04-27 – 2023-06-11 (×2): qty 90, 30d supply, fill #0

## 2023-04-27 MED ORDER — HYDROCODONE-ACETAMINOPHEN 7.5-325 MG PO TABS
1.0000 | ORAL_TABLET | Freq: Three times a day (TID) | ORAL | 0 refills | Status: DC | PRN
  Filled 2023-04-27: qty 90, 30d supply, fill #0

## 2023-04-27 NOTE — Telephone Encounter (Signed)
PDMP okay, Rx sent 

## 2023-04-27 NOTE — Telephone Encounter (Signed)
Requesting: hydrocodone Contract:  11/10/21 UDS: 11/10/21 Last Visit: 12/27/22 Next Visit: 05/21/23 Last Refill: see med list   Please Advise

## 2023-04-29 NOTE — Telephone Encounter (Signed)
Medical Buy and Bill  Prior Authorization NOT required for PROLIA   COVERAGE DETAILS: Prolia will be subject to a 20.0% coinsurance up to a $3,150.00 out of pocket max  ($0.00 met). Administration will be covered at 100%. No deductible applies. The benefits provided on this  Verification of Benefits form are Medical Benefits and are the patient's In-Network benefits for Prolia. If you  would like Pharmacy Benefits for Prolia, please call [phone].

## 2023-04-29 NOTE — Telephone Encounter (Signed)
Patient is ready for scheduling on or after 04/28/23 BUY AND BILL  Out-of-pocket cost due at time of visit: $356.87  Primary: [employer] [place] [employer] Adv Enhanced HMO POS Prolia co-insurance: 20% (approximately $331.87) Admin fee co-insurance: 20% (approximately $25)  Deductible: does not apply  Prior Auth: NOT required  Secondary: N/A Prolia co-insurance:  Admin fee co-insurance:  Deductible:  Prior Auth:  PA# Valid:   ** This summary of benefits is an estimation of the patient's out-of-pocket cost. Exact cost may vary based on individual plan coverage.

## 2023-05-01 ENCOUNTER — Ambulatory Visit: Payer: Medicare Other

## 2023-05-01 DIAGNOSIS — M810 Age-related osteoporosis without current pathological fracture: Secondary | ICD-10-CM

## 2023-05-01 MED ORDER — DENOSUMAB 60 MG/ML ~~LOC~~ SOSY
60.0000 mg | PREFILLED_SYRINGE | Freq: Once | SUBCUTANEOUS | Status: AC
  Administered 2023-11-07: 60 mg via SUBCUTANEOUS

## 2023-05-01 MED ORDER — DENOSUMAB 60 MG/ML ~~LOC~~ SOSY
60.0000 mg | PREFILLED_SYRINGE | Freq: Once | SUBCUTANEOUS | Status: AC
  Administered 2023-05-01: 60 mg via SUBCUTANEOUS

## 2023-05-01 NOTE — Progress Notes (Signed)
After obtaining consent, and per orders of Dr. Elvera Lennox, injection of Prolia 60 mg given by Pollie Meyer. Patient instructed to remain in clinic for 20 minutes afterwards, and to report any adverse reaction to me immediately.

## 2023-05-07 ENCOUNTER — Ambulatory Visit: Payer: Medicare Other | Admitting: Orthopaedic Surgery

## 2023-05-07 ENCOUNTER — Encounter: Payer: Self-pay | Admitting: Orthopaedic Surgery

## 2023-05-07 DIAGNOSIS — M25551 Pain in right hip: Secondary | ICD-10-CM

## 2023-05-07 DIAGNOSIS — M7061 Trochanteric bursitis, right hip: Secondary | ICD-10-CM

## 2023-05-07 DIAGNOSIS — M1611 Unilateral primary osteoarthritis, right hip: Secondary | ICD-10-CM

## 2023-05-07 NOTE — Progress Notes (Signed)
The patient is following up about 6 weeks after having a steroid injection in her right hip joint under ultrasound by Dr. Shon Baton.  This is to treat moderate arthritis of her hip.  She says the injection took about a week to work but now it is still doing well for her.  She is an active 78 year old female.  On exam I can easily put her right hip and left hip the range of motion and they feel equal.  She is not exhibiting much pain at all.  Since she is doing so well we will have her follow-up as needed.  I told her to wait at least 5 to 6 months between intra-articular steroid injections in the hip.  If he gets to where she would like to try that again around May or June of this year or later, she can call our office to have an appoint with Dr. Shon Baton for this injection.  All questions and concerns were addressed and answered.

## 2023-05-12 NOTE — Telephone Encounter (Signed)
 Last Prolia inj 05/01/23 Next Prolia inj due 10/30/23

## 2023-05-21 ENCOUNTER — Encounter: Payer: Self-pay | Admitting: Internal Medicine

## 2023-05-21 ENCOUNTER — Ambulatory Visit (INDEPENDENT_AMBULATORY_CARE_PROVIDER_SITE_OTHER): Payer: Medicare Other | Admitting: Internal Medicine

## 2023-05-21 VITALS — BP 116/80 | HR 73 | Temp 97.8°F | Resp 16 | Ht 65.0 in | Wt 147.4 lb

## 2023-05-21 DIAGNOSIS — M549 Dorsalgia, unspecified: Secondary | ICD-10-CM | POA: Diagnosis not present

## 2023-05-21 DIAGNOSIS — E785 Hyperlipidemia, unspecified: Secondary | ICD-10-CM

## 2023-05-21 DIAGNOSIS — E038 Other specified hypothyroidism: Secondary | ICD-10-CM

## 2023-05-21 DIAGNOSIS — K5909 Other constipation: Secondary | ICD-10-CM

## 2023-05-21 DIAGNOSIS — G4700 Insomnia, unspecified: Secondary | ICD-10-CM | POA: Diagnosis not present

## 2023-05-21 DIAGNOSIS — R609 Edema, unspecified: Secondary | ICD-10-CM

## 2023-05-21 DIAGNOSIS — Z79899 Other long term (current) drug therapy: Secondary | ICD-10-CM | POA: Diagnosis not present

## 2023-05-21 DIAGNOSIS — G8929 Other chronic pain: Secondary | ICD-10-CM | POA: Diagnosis not present

## 2023-05-21 LAB — TSH: TSH: 2.05 u[IU]/mL (ref 0.35–5.50)

## 2023-05-21 LAB — BASIC METABOLIC PANEL
BUN: 13 mg/dL (ref 6–23)
CO2: 32 meq/L (ref 19–32)
Calcium: 8.9 mg/dL (ref 8.4–10.5)
Chloride: 101 meq/L (ref 96–112)
Creatinine, Ser: 0.74 mg/dL (ref 0.40–1.20)
GFR: 78.15 mL/min (ref >60.00)
Glucose, Bld: 66 mg/dL — ABNORMAL LOW (ref 70–99)
Potassium: 3.7 meq/L (ref 3.5–5.1)
Sodium: 142 meq/L (ref 135–145)

## 2023-05-21 LAB — LIPID PANEL
Cholesterol: 141 mg/dL (ref 0–200)
HDL: 66.5 mg/dL (ref >39.00)
LDL Cholesterol: 62 mg/dL (ref 0–99)
NonHDL: 74.74
Total CHOL/HDL Ratio: 2
Triglycerides: 62 mg/dL (ref 0.0–149.0)
VLDL: 12.4 mg/dL (ref 0.0–40.0)

## 2023-05-21 NOTE — Progress Notes (Signed)
 Subjective:    Patient ID: [name], female    DOB: 1945/08/25, 78 y.o.   MRN: [id number]  DOS:  05/21/2023 Type of visit - description: Follow-up  Here for follow-up, no new concerns. Managed chronic medical problems. Has a long history of constipation.  Requests advise.  Review of Systems See above   Past Medical History:  Diagnosis Date   Allergy see chart   Anxiety 06/2020   back pain   Arthritis    Back pain    Back pain    Cataract    both eyes rt. 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   Fibromyalgia    see rheumatology   GERD (gastroesophageal reflux disease)    PAST HX    High blood pressure    High cholesterol    Hyperlipidemia    Hypothyroidism    Joint pain    Migraine    Ocular rosacea 03/2009   on doxy   Osteoporosis    osteopenia   Pleuritic chest pain    etiology unclear, has beeb eval by pulmonary and rheumatology (autoimmune dz?)   Swelling of lower extremity    Wrist fracture 2018    Past Surgical History:  Procedure Laterality Date   ANTERIOR LAT LUMBAR FUSION N/A 12/03/2019   Procedure: ANTERIOR LATERAL LUMBAR FUSION L2-4;  Surgeon: Mort Ards, MD;  Location: [facility] OR;  Service: Orthopedics;  Laterality: N/A;  4 hrs   COLONOSCOPY     COSMETIC SURGERY  04/12/2018   L upper eyelid ptosis repair, and trichophytic brow lift   ESOPHAGOGASTRODUODENOSCOPY (EGD) WITH PROPOFOL  N/A 03/27/2013   Procedure: ESOPHAGOGASTRODUODENOSCOPY (EGD) WITH PROPOFOL ;  Surgeon: Janel Medford, MD;  Location: [facility];  Service: Endoscopy;  Laterality: N/A;  possible dil   EYE SURGERY     growth on cornea-bil.   FRACTURE SURGERY  hand 2017   plastic surgery face  2013   POLYPECTOMY     right rotator cuff repair  2010   SPINE SURGERY      TONSILLECTOMY     UPPER GASTROINTESTINAL ENDOSCOPY      Current Outpatient Medications  Medication Instructions   aspirin  EC 81 mg, Daily   calcium  carbonate (OS-CAL) 1250 (500 CA) MG chewable tablet 1 tablet, 2 times daily   cycloSPORINE  (RESTASIS ) 0.05 % ophthalmic emulsion 1 drop, 2 times daily   denosumab  (PROLIA ) 60 mg, Subcutaneous, Every 6 months   Estradiol  (YUVAFEM ) 10 MCG TABS vaginal tablet PLACE 1 TABLET VAGINALLY 2 TIMES A WEEK.   fluticasone  (FLONASE ) 50 MCG/ACT nasal spray 2 sprays, Each Nare, Daily   fluticasone -salmeterol (WIXELA INHUB ) 100-50 MCG/ACT AEPB 1 puff, Inhalation, 2 times daily   hydrochlorothiazide  (MICROZIDE ) 12.5 MG capsule Oral, Daily   HYDROcodone -acetaminophen  (NORCO) 7.5-325 MG tablet 1 tablet, Oral, 3 times daily PRN   HYDROcodone -acetaminophen  (NORCO) 7.5-325 MG tablet 1 tablet, Oral, 3 times daily PRN   hydroxypropyl methylcellulose / hypromellose (ISOPTO TEARS / GONIOVISC) 2.5 % ophthalmic solution 1 drop, 3 times daily PRN   levothyroxine  (SYNTHROID ) 88 mcg, Oral, Daily before breakfast   Multiple Vitamin (MULTIVITAMIN) capsule 1 capsule, Daily   naloxone  (NARCAN ) 4 MG/0.1ML LIQD nasal spray kit Use as directed   omeprazole  (PRILOSEC ) 40  MG capsule TAKE 1 CAPSULE 30 MIN BEFORE BREAKFAST MEAL DAILY.   potassium chloride  (KLOR-CON  M) 10 MEQ tablet 10 mEq, Oral, Daily   pravastatin  (PRAVACHOL ) 20 mg, Oral, Daily at bedtime   Vitamin D3 2,000 Units, Daily   vitamin E 1000 UNIT capsule    zolpidem  (AMBIEN ) 5-10 mg, Oral, At bedtime PRN       Objective:   Physical Exam BP 116/80   Pulse 73   Temp 97.8 F (36.6 C) (Oral)   Resp 16   Ht 5\' 5"  (1.651 m)   Wt 147 lb 6 oz (66.8 kg)   LMP 04/10/1998 (Approximate)   SpO2 97%   BMI 24.52 kg/m  General:   Well developed, NAD, BMI noted. HEENT:  Normocephalic . Face symmetric, atraumatic Lungs:  CTA B Normal respiratory effort, no intercostal retractions, no accessory muscle use. Heart: RRR,   no murmur.  Lower extremities: no pretibial edema bilaterally  Skin: Not pale. Not jaundice Neurologic:  alert & oriented X3.  Speech normal, gait appropriate for age and unassisted Psych--  Cognition and judgment appear intact.  Cooperative with normal attention span and concentration.  Behavior appropriate. No anxious or depressed appearing.      Assessment    Problem list Hypothyroidism Hyperlipidemia: Started meds 09/2019 Lower extremity edema: On HCTZ GERD Migraines insmonia-- xanax  prn COPD: per chest CTs, former heavy smoker, quit 2005 [facility]- Pain mngmt  -- back pain, 2017 had a MRI, saw Dr Aviva Lemmings, Rx local injection, PT --h/o fibromyalgia --H/o Pleuritic chest pain, on-off, resolved  --spinal stenosis (MRI 05/2018)  Osteopenia:  Per ENDO --T score 2011   -2.6 (intolerant to fosamax, ok w/ Boniva x years) --T score 01-2013   -2.6 --T score 02-2015  -2.3, rx ca and cit D  Glaucoma Ocular Rosacea 2010 CV: LAD  artery disease  per CT scan of the chest, CP:  admitted, stress lest low risk 05-2015.  Control CV RF CAD, aortic sclerosis: Per CT Chronic hoarseness: ENT OV 04-2022, RTC prn  PLAN: Hypothyroidism: Last TSH was over 6 months ago, on levothyroxine , check TSH. Hyperlipidemia: On pravastatin , check FLP. Edema: On HCTZ, KCl, check BMP. COPD: On Wixela, essentially asymptomatic. CAD:  Saw cardiology October 2024, no further eval was needed. MSK, pain management: Has DJD, sees Ortho regularly, currently the right hip is giving her a lot of problems.  Takes hydrocodone  3 times a day.  Check a UDS Abdominal pain, diarrhea: See LOV, resolved Chronic constipation: Long history of constipation, her current strategy is a stool softener qd, fiber and Dulcolax on average every 3 days, she could switch Dulcolax for MiraLAX  instead.  Encouraged a diet rich in fruits and vegetables. Vaccine advice: Recommend COVID booster. RTC 11-2023 CPX

## 2023-05-21 NOTE — Patient Instructions (Addendum)
 Vaccines I recommend: Covid booster    GO TO THE LAB : Get the blood work     Next visit with me by 11-2023, physical exam. Please schedule it at the front desk

## 2023-05-21 NOTE — Assessment & Plan Note (Signed)
 Hypothyroidism: Last TSH was over 6 months ago, on levothyroxine , check TSH. Hyperlipidemia: On pravastatin , check FLP. Edema: On HCTZ, KCl, check BMP. COPD: On Wixela, essentially asymptomatic. CAD:  Saw cardiology October 2024, no further eval was needed. MSK, pain management: Has DJD, sees Ortho regularly, currently the right hip is giving her a lot of problems.  Takes hydrocodone  3 times a day.  Check a UDS Abdominal pain, diarrhea: See LOV, resolved Chronic constipation: Long history of constipation, her current strategy is a stool softener qd, fiber and Dulcolax on average every 3 days, she could switch Dulcolax for MiraLAX  instead.  Encouraged a diet rich in fruits and vegetables. Vaccine advice: Recommend COVID booster. RTC 11-2023 CPX

## 2023-05-23 ENCOUNTER — Ambulatory Visit (INDEPENDENT_AMBULATORY_CARE_PROVIDER_SITE_OTHER): Payer: Self-pay | Admitting: Bariatrics

## 2023-05-23 ENCOUNTER — Encounter: Payer: Self-pay | Admitting: Bariatrics

## 2023-05-23 ENCOUNTER — Encounter: Payer: Self-pay | Admitting: Internal Medicine

## 2023-05-23 VITALS — BP 123/76 | HR 64 | Temp 97.5°F | Ht 65.0 in | Wt 140.0 lb

## 2023-05-23 DIAGNOSIS — R7303 Prediabetes: Secondary | ICD-10-CM

## 2023-05-23 DIAGNOSIS — E669 Obesity, unspecified: Secondary | ICD-10-CM | POA: Diagnosis not present

## 2023-05-23 DIAGNOSIS — Z6823 Body mass index (BMI) 23.0-23.9, adult: Secondary | ICD-10-CM | POA: Diagnosis not present

## 2023-05-23 LAB — DRUG MONITORING PANEL 375977 , URINE
Alcohol Metabolites: NEGATIVE ng/mL (ref <500)
Amphetamines: NEGATIVE ng/mL (ref <500)
Barbiturates: NEGATIVE ng/mL (ref <300)
Benzodiazepines: NEGATIVE ng/mL (ref <100)
Cocaine Metabolite: NEGATIVE ng/mL (ref <150)
Codeine: NEGATIVE ng/mL (ref <50)
Desmethyltramadol: NEGATIVE ng/mL (ref <100)
Hydrocodone: 786 ng/mL — ABNORMAL HIGH (ref <50)
Hydromorphone: 393 ng/mL — ABNORMAL HIGH (ref <50)
Marijuana Metabolite: NEGATIVE ng/mL (ref <20)
Morphine: NEGATIVE ng/mL (ref <50)
Norhydrocodone: 1414 ng/mL — ABNORMAL HIGH (ref <50)
Opiates: POSITIVE ng/mL — AB (ref <100)
Oxycodone: NEGATIVE ng/mL (ref <100)
Tramadol: NEGATIVE ng/mL (ref <100)

## 2023-05-23 LAB — DM TEMPLATE

## 2023-05-23 NOTE — Progress Notes (Signed)
WEIGHT SUMMARY AND BIOMETRICS  Weight Lost Since Last Visit: 0  Weight Gained Since Last Visit: 1lb   Vitals Temp: (!) 97.5 F (36.4 C) BP: 123/76 Pulse Rate: 64 SpO2: 100 %   Anthropometric Measurements Height: 5\' 5"  (1.651 m) Weight: 140 lb (63.5 kg) BMI (Calculated): 23.3 Weight at Last Visit: 139lb Weight Lost Since Last Visit: 0 Weight Gained Since Last Visit: 1lb Starting Weight: 184lb Total Weight Loss (lbs): 44 lb (20 kg)   Body Composition  Body Fat %: 36.1 % Fat Mass (lbs): 50.6 lbs Muscle Mass (lbs): 85 lbs Total Body Water (lbs): 61.8 lbs Visceral Fat Rating : 10   Other Clinical Data Fasting: no Labs: no Today's Visit #: 15 Starting Date: 11/23/21    OBESITY [name] is here to discuss her progress with her obesity treatment plan along with follow-up of her obesity related diagnoses.    Nutrition Plan: the pescatarian plan - 30% adherence.  Current exercise: none  Interim History:  She is up 1 lb since her last visit.  Eating all of the food on the plan., Protein intake is as prescribed, Water intake is adequate., and Denies polyphagia   Hunger is moderately controlled.  Cravings are moderately controlled.  Assessment/Plan:   Prediabetes Last A1c was 5.5  Medication(s): none Lab Results  Component Value Date   HGBA1C 5.5 11/15/2022   HGBA1C 5.7 (H) 11/23/2021   HGBA1C 5.8 03/24/2021   HGBA1C 5.5 05/22/2015   Lab Results  Component Value Date   INSULIN 8.7 11/23/2021    Plan: Will minimize all refined carbohydrates both sweets and starches.  Will work on the plan and exercise.  Consider both aerobic and resistance training.  Will keep protein, water, and fiber intake high.  Increase Polyunsaturated and Monounsaturated fats to increase satiety and encourage weight loss.  Aim for 7 to 9 hours of sleep nightly.  She  will continue to eat most of her meals at home and avoid eating out too much.    Generalized Obesity: Current BMI BMI (Calculated): 23.3    [name] is currently in the action stage of change. As such, her goal is to maintain weight for now.  She has agreed to the Category 3 plan.  Exercise goals: She will add in some weights.  She is limited somewhat secondary to having arthritis in her right hip, but will continue to walk as tolerated.  Behavioral modification strategies: increasing lean protein intake, no meal skipping, decrease eating out, meal planning , increase water intake, planning for success, avoiding temptations, and keep healthy foods in the home.  [name] has agreed to follow-up with our clinic in 3 months.     Objective:   VITALS: Per patient if applicable, see vitals. GENERAL: Alert and in no acute distress. CARDIOPULMONARY: No increased WOB. Speaking in clear sentences.  PSYCH: Pleasant and cooperative. Speech normal  rate and rhythm. Affect is appropriate. Insight and judgement are appropriate. Attention is focused, linear, and appropriate.  NEURO: Oriented as arrived to appointment on time with no prompting.   Attestation Statements:   This was prepared with the assistance of Engineer, civil (consulting).  Occasional wrong-word or sound-a-like substitutions may have occurred due to the inherent limitations of voice recognition   Michelle Capra, DO

## 2023-05-24 ENCOUNTER — Telehealth: Payer: Self-pay | Admitting: Internal Medicine

## 2023-05-24 MED ORDER — HYDROCODONE-ACETAMINOPHEN 7.5-325 MG PO TABS: 1.0000 | ORAL_TABLET | Freq: Three times a day (TID) | ORAL | 0 refills | Status: DC | PRN

## 2023-05-24 MED ORDER — HYDROCODONE-ACETAMINOPHEN 7.5-325 MG PO TABS
1.0000 | ORAL_TABLET | Freq: Three times a day (TID) | ORAL | 0 refills | Status: DC | PRN
Start: 2023-05-24 — End: 2023-08-06

## 2023-05-24 NOTE — Telephone Encounter (Signed)
Requesting: hydrocodone 7.5-325mg   Contract: 11/23/22 UDS: 05/21/23 Last Visit: 05/21/23 Next Visit: 12/04/23 Last Refill: 04/27/23 #90 and 0RF   Please Advise

## 2023-05-24 NOTE — Telephone Encounter (Signed)
Copied from CRM [id number]. Topic: Clinical - Medication Refill >> May 24, 2023  1:46 PM Florestine Avers wrote: Most Recent Primary Care Visit:  Provider: Willow Ora E  Department: [facility]  Visit Type: OFFICE VISIT  Date: 05/21/2023  Medication: HYDROcodone-acetaminophen (NORCO) 7.5-325 MG tablet  Has the patient contacted their pharmacy? Yes (Agent: If no, request that the patient contact the pharmacy for the refill. If patient does not wish to contact the pharmacy document the reason why and proceed with request.) (Agent: If yes, when and what did the pharmacy advise?)  Is this the correct pharmacy for this prescription? Yes If no, delete pharmacy and type the correct one.  This is the patient's preferred pharmacy:  [facility] [id number] - [place] - [address] [place] [address] Phone: [phone] Fax: [phone]   Has the prescription been filled recently? No  Is the patient out of the medication? Yes  Has the patient been seen for an appointment in the last year OR does the patient have an upcoming appointment? Yes  Can we respond through MyChart? Yes  Agent: Please be advised that Rx refills may take up to 3 business days. We ask that you follow-up with your pharmacy.

## 2023-05-24 NOTE — Telephone Encounter (Signed)
PDMP okay, Rx sent

## 2023-05-27 ENCOUNTER — Encounter: Payer: Self-pay | Admitting: Bariatrics

## 2023-05-28 ENCOUNTER — Other Ambulatory Visit: Payer: Self-pay | Admitting: Bariatrics

## 2023-05-28 MED ORDER — LINACLOTIDE 145 MCG PO CAPS: 145.0000 ug | ORAL_CAPSULE | Freq: Every day | ORAL | 2 refills | Status: DC

## 2023-06-08 ENCOUNTER — Other Ambulatory Visit: Payer: Self-pay | Admitting: Internal Medicine

## 2023-06-08 ENCOUNTER — Other Ambulatory Visit (HOSPITAL_BASED_OUTPATIENT_CLINIC_OR_DEPARTMENT_OTHER): Payer: Self-pay

## 2023-06-08 MED ORDER — ZOLPIDEM TARTRATE 10 MG PO TABS
5.0000 mg | ORAL_TABLET | Freq: Every evening | ORAL | 3 refills | Status: DC | PRN
Start: 2023-06-08 — End: 2023-10-02
  Filled 2023-06-08: qty 30, 30d supply, fill #0
  Filled 2023-07-08: qty 30, 30d supply, fill #1
  Filled 2023-08-05: qty 30, 30d supply, fill #2
  Filled 2023-09-04: qty 30, 30d supply, fill #3

## 2023-06-08 NOTE — Telephone Encounter (Signed)
Pdmp ok rx sent  

## 2023-06-11 ENCOUNTER — Other Ambulatory Visit (HOSPITAL_BASED_OUTPATIENT_CLINIC_OR_DEPARTMENT_OTHER): Payer: Self-pay

## 2023-06-11 ENCOUNTER — Other Ambulatory Visit: Payer: Self-pay | Admitting: Internal Medicine

## 2023-06-11 MED ORDER — OMEPRAZOLE 40 MG PO CPDR
40.0000 mg | DELAYED_RELEASE_CAPSULE | Freq: Every day | ORAL | 2 refills | Status: AC
  Filled 2023-12-31: qty 90, 90d supply, fill #0
  Filled 2024-03-31: qty 90, 90d supply, fill #1

## 2023-06-11 MED ORDER — LINACLOTIDE 145 MCG PO CAPS: 145.0000 ug | ORAL_CAPSULE | Freq: Every day | ORAL | 2 refills | Status: DC

## 2023-06-11 MED ORDER — LEVOTHYROXINE SODIUM 88 MCG PO TABS
88.0000 ug | ORAL_TABLET | Freq: Every morning | ORAL | 2 refills | Status: DC
  Filled 2023-07-29: qty 90, 90d supply, fill #0
  Filled 2023-10-25: qty 90, 90d supply, fill #1

## 2023-06-12 ENCOUNTER — Other Ambulatory Visit (HOSPITAL_BASED_OUTPATIENT_CLINIC_OR_DEPARTMENT_OTHER): Payer: Self-pay

## 2023-06-12 MED ORDER — PRAVASTATIN SODIUM 20 MG PO TABS
20.0000 mg | ORAL_TABLET | Freq: Every day | ORAL | 1 refills | Status: DC
  Filled 2023-06-12 – 2023-07-29 (×2): qty 90, 90d supply, fill #0
  Filled ????-??-??: fill #0

## 2023-06-12 MED ORDER — FLUTICASONE-SALMETEROL 100-50 MCG/ACT IN AEPB
1.0000 | INHALATION_SPRAY | Freq: Two times a day (BID) | RESPIRATORY_TRACT | 5 refills | Status: DC
  Filled 2023-06-12 – 2023-10-25 (×2): qty 60, 30d supply, fill #0
  Filled 2023-11-29: qty 60, 30d supply, fill #1
  Filled 2023-12-31: qty 60, 30d supply, fill #2
  Filled 2024-01-28: qty 60, 30d supply, fill #3
  Filled 2024-02-27: qty 60, 30d supply, fill #4
  Filled 2024-03-31: qty 60, 30d supply, fill #5

## 2023-06-12 MED ORDER — HYDROCHLOROTHIAZIDE 12.5 MG PO CAPS
12.5000 mg | ORAL_CAPSULE | Freq: Every day | ORAL | 1 refills | Status: DC
  Filled 2023-06-12 – 2023-07-29 (×2): qty 90, 90d supply, fill #0
  Filled 2023-10-25: qty 90, 90d supply, fill #1
  Filled ????-??-??: fill #0

## 2023-06-15 ENCOUNTER — Other Ambulatory Visit (HOSPITAL_BASED_OUTPATIENT_CLINIC_OR_DEPARTMENT_OTHER): Payer: Self-pay

## 2023-06-20 ENCOUNTER — Telehealth: Payer: Self-pay | Admitting: Internal Medicine

## 2023-06-20 NOTE — Telephone Encounter (Signed)
 Copied from CRM [id number]. Topic: Medicare AWV >> Jun 20, 2023  2:38 PM Payton Doughty wrote: Reason for CRM: Called LVM 06/20/2023 to schedule AWV. Please schedule office or virtual visits  Verlee Rossetti; Care Guide Ambulatory Clinical Support [place] [facility] Direct Dial: [phone]

## 2023-07-08 ENCOUNTER — Telehealth: Payer: Self-pay | Admitting: Internal Medicine

## 2023-07-09 ENCOUNTER — Other Ambulatory Visit: Payer: Self-pay

## 2023-07-09 ENCOUNTER — Other Ambulatory Visit (HOSPITAL_BASED_OUTPATIENT_CLINIC_OR_DEPARTMENT_OTHER): Payer: Self-pay

## 2023-07-09 MED ORDER — HYDROCODONE-ACETAMINOPHEN 7.5-325 MG PO TABS
1.0000 | ORAL_TABLET | Freq: Three times a day (TID) | ORAL | 0 refills | Status: DC | PRN
  Filled 2023-07-09: qty 90, 30d supply, fill #0

## 2023-07-09 NOTE — Telephone Encounter (Signed)
 Requesting: hydrocodone/apap 7.5-325mg  Contract: No UDS: 05/21/23 Last Visit: 05/21/2023 Next Visit: 12/04/2023 Last Refill: 05/24/23  Please Advise

## 2023-07-09 NOTE — Telephone Encounter (Signed)
 PDMP okay, Rx sent

## 2023-07-10 ENCOUNTER — Other Ambulatory Visit (HOSPITAL_BASED_OUTPATIENT_CLINIC_OR_DEPARTMENT_OTHER): Payer: Self-pay

## 2023-07-10 DIAGNOSIS — H18453 Nodular corneal degeneration, bilateral: Secondary | ICD-10-CM | POA: Diagnosis not present

## 2023-07-10 MED ORDER — FLUOROMETHOLONE 0.1 % OP SUSP
OPHTHALMIC | 0 refills | Status: AC
  Filled 2023-07-10: qty 5, 28d supply, fill #0

## 2023-07-30 ENCOUNTER — Other Ambulatory Visit (HOSPITAL_BASED_OUTPATIENT_CLINIC_OR_DEPARTMENT_OTHER): Payer: Self-pay

## 2023-08-01 ENCOUNTER — Other Ambulatory Visit: Payer: Self-pay | Admitting: Internal Medicine

## 2023-08-05 ENCOUNTER — Telehealth: Payer: Self-pay | Admitting: Internal Medicine

## 2023-08-05 ENCOUNTER — Other Ambulatory Visit: Payer: Self-pay | Admitting: Bariatrics

## 2023-08-06 ENCOUNTER — Other Ambulatory Visit (HOSPITAL_BASED_OUTPATIENT_CLINIC_OR_DEPARTMENT_OTHER): Payer: Self-pay

## 2023-08-06 ENCOUNTER — Other Ambulatory Visit: Payer: Self-pay

## 2023-08-06 MED ORDER — HYDROCODONE-ACETAMINOPHEN 7.5-325 MG PO TABS
1.0000 | ORAL_TABLET | Freq: Three times a day (TID) | ORAL | 0 refills | Status: DC | PRN
  Filled 2023-08-06: qty 90, 30d supply, fill #0

## 2023-08-06 MED ORDER — HYDROCODONE-ACETAMINOPHEN 7.5-325 MG PO TABS
1.0000 | ORAL_TABLET | Freq: Three times a day (TID) | ORAL | 0 refills | Status: DC | PRN
  Filled 2023-08-06 – 2023-08-07 (×2): qty 90, 30d supply, fill #0

## 2023-08-06 NOTE — Telephone Encounter (Signed)
 PDMP okay, Rx sent

## 2023-08-06 NOTE — Telephone Encounter (Signed)
 Requesting: hydrocodone  7.5-325mg   Contract:11/23/22 UDS: 05/21/23 Last Visit: 05/21/23 Next Visit: 12/04/23 Last Refill: 05/24/23 #90 and 0RF (x2)  Please Advise

## 2023-08-07 ENCOUNTER — Other Ambulatory Visit (HOSPITAL_BASED_OUTPATIENT_CLINIC_OR_DEPARTMENT_OTHER): Payer: Self-pay

## 2023-08-08 ENCOUNTER — Other Ambulatory Visit (HOSPITAL_BASED_OUTPATIENT_CLINIC_OR_DEPARTMENT_OTHER): Payer: Self-pay

## 2023-08-16 ENCOUNTER — Ambulatory Visit: Admitting: Physician Assistant

## 2023-08-16 ENCOUNTER — Encounter: Payer: Self-pay | Admitting: Physician Assistant

## 2023-08-16 ENCOUNTER — Ambulatory Visit (INDEPENDENT_AMBULATORY_CARE_PROVIDER_SITE_OTHER): Admitting: Physician Assistant

## 2023-08-16 ENCOUNTER — Other Ambulatory Visit (HOSPITAL_BASED_OUTPATIENT_CLINIC_OR_DEPARTMENT_OTHER): Payer: Self-pay

## 2023-08-16 VITALS — BP 112/80 | HR 67 | Temp 97.7°F | Ht 65.0 in | Wt 144.6 lb

## 2023-08-16 DIAGNOSIS — J441 Chronic obstructive pulmonary disease with (acute) exacerbation: Secondary | ICD-10-CM

## 2023-08-16 MED ORDER — METHYLPREDNISOLONE 4 MG PO TBPK
ORAL_TABLET | ORAL | 0 refills | Status: AC
  Filled 2023-08-16: qty 21, 6d supply, fill #0

## 2023-08-16 MED ORDER — ALBUTEROL SULFATE HFA 108 (90 BASE) MCG/ACT IN AERS
2.0000 | INHALATION_SPRAY | Freq: Four times a day (QID) | RESPIRATORY_TRACT | 2 refills | Status: AC | PRN
  Filled 2023-08-16: qty 6.7, 25d supply, fill #0
  Filled 2024-03-31: qty 6.7, 25d supply, fill #1

## 2023-08-16 MED ORDER — BENZONATATE 100 MG PO CAPS
100.0000 mg | ORAL_CAPSULE | Freq: Two times a day (BID) | ORAL | 0 refills | Status: DC | PRN
  Filled 2023-08-16: qty 30, 15d supply, fill #0

## 2023-08-16 NOTE — Progress Notes (Signed)
 Established patient visit   Patient: [name]   DOB: 1945/05/17   78 y.o. Female  MRN: [id number] Visit Date: 08/16/2023  Today's healthcare provider: Trenton Frock, PA-C   Cc. Cough, congestion  Subjective     Pt reports cough, congestion x 1 week, she has taken mucinex DM over the counter. Reports chest tightness and sob at night. Does not have an albuterol  inhaler.  Reports her husband has been sick recently as well.   History of COPD, uses wixela daily. Medications: Outpatient Medications Prior to Visit  Medication Sig   aspirin  81 MG EC tablet Take 81 mg by mouth daily. Swallow whole.   calcium  carbonate (OS-CAL) 1250 (500 CA) MG chewable tablet Chew 1 tablet by mouth 2 (two) times daily.   Cholecalciferol  (VITAMIN D3) 50 MCG (2000 UT) CHEW Chew 2,000 Units by mouth daily.   cycloSPORINE  (RESTASIS ) 0.05 % ophthalmic emulsion 1 drop 2 (two) times daily.   denosumab  (PROLIA ) 60 MG/ML SOSY injection Inject 60 mg into the skin every 6 (six) months.   Estradiol  (YUVAFEM ) 10 MCG TABS vaginal tablet PLACE 1 TABLET VAGINALLY 2 TIMES A WEEK.   fluticasone  (FLONASE ) 50 MCG/ACT nasal spray Place 2 sprays into both nostrils daily.   fluticasone -salmeterol (WIXELA INHUB ) 100-50 MCG/ACT AEPB Inhale 1 puff into the lungs 2 (two) times daily.   hydrochlorothiazide  (MICROZIDE ) 12.5 MG capsule Take 1 capsule (12.5 mg total) by mouth daily.   HYDROcodone -acetaminophen  (NORCO) 7.5-325 MG tablet Take 1 tablet by mouth 3 (three) times daily as needed for moderate pain (pain score 4-6).   hydroxypropyl methylcellulose / hypromellose (ISOPTO TEARS / GONIOVISC) 2.5 % ophthalmic solution Place 1 drop into both eyes 3 (three) times daily as needed for dry eyes.   levothyroxine  (SYNTHROID ) 88 MCG tablet Take 1 tablet (88 mcg total) by mouth every morning before breakfast.   linaclotide  (LINZESS ) 145 MCG CAPS capsule Take 1 capsule (145 mcg total) by mouth daily before breakfast.   Multiple  Vitamin (MULTIVITAMIN) capsule Take 1 capsule by mouth daily.   naloxone  (NARCAN ) 4 MG/0.1ML LIQD nasal spray kit Use as directed   omeprazole  (PRILOSEC ) 40 MG capsule Take 1 capsule (40 mg total) by mouth daily 30 mins. before breakfast.   potassium chloride  (KLOR-CON  M) 10 MEQ tablet Take 1 tablet (10 mEq total) by mouth daily.   pravastatin  (PRAVACHOL ) 20 MG tablet Take 1 tablet (20 mg total) by mouth at bedtime.   vitamin E 1000 UNIT capsule    zolpidem  (AMBIEN ) 10 MG tablet Take 0.5-1 tablets (5-10 mg total) by mouth at bedtime as needed for sleep.   [DISCONTINUED] HYDROcodone -acetaminophen  (NORCO) 7.5-325 MG tablet Take 1 tablet by mouth 3 (three) times daily as needed for moderate pain (pain score 4-6).   [DISCONTINUED] levothyroxine  (SYNTHROID ) 88 MCG tablet Take 1 tablet (88 mcg total) by mouth daily before breakfast.   [DISCONTINUED] linaclotide  (LINZESS ) 145 MCG CAPS capsule Take 1 capsule (145 mcg total) by mouth daily before breakfast.   [DISCONTINUED] omeprazole  (PRILOSEC ) 40 MG capsule TAKE 1 CAPSULE 30 MIN BEFORE BREAKFAST MEAL DAILY.   Facility-Administered Medications Prior to Visit  Medication Dose Route Frequency Provider   [START ON 10/29/2023] denosumab  (PROLIA ) injection 60 mg  60 mg Subcutaneous Once Gherghe, Cristina, MD    Review of Systems  Constitutional:  Negative for fatigue and fever.  Respiratory:  Positive for cough, shortness of breath and wheezing.   Cardiovascular:  Negative for chest pain and leg swelling.  Gastrointestinal:  Negative for abdominal pain.  Neurological:  Negative for dizziness and headaches.       Objective    BP 112/80   Pulse 67   Temp 97.7 F (36.5 C)   Ht 5\' 5"  (1.651 m)   Wt 144 lb 9.6 oz (65.6 kg)   LMP 04/10/1998 (Approximate)   SpO2 99%   BMI 24.06 kg/m    Physical Exam Constitutional:      General: She is awake.     Appearance: She is well-developed.  HENT:     Head: Normocephalic.     Right Ear: Tympanic  membrane normal.     Left Ear: Tympanic membrane normal.     Mouth/Throat:     Pharynx: Posterior oropharyngeal erythema present.  Eyes:     Conjunctiva/sclera: Conjunctivae normal.  Cardiovascular:     Rate and Rhythm: Normal rate and regular rhythm.     Heart sounds: Normal heart sounds.  Pulmonary:     Effort: Pulmonary effort is normal.     Breath sounds: Normal breath sounds. No wheezing, rhonchi or rales.  Skin:    General: Skin is warm.  Neurological:     Mental Status: She is alert and oriented to person, place, and time.  Psychiatric:        Attention and Perception: Attention normal.        Mood and Affect: Mood normal.        Speech: Speech normal.        Behavior: Behavior is cooperative.     No results found for any visits on 08/16/23.  Assessment & Plan    COPD exacerbation (HCC) -     Benzonatate ; Take 1 capsule (100 mg total) by mouth 2 (two) times daily as needed for cough.  Dispense: 30 capsule; Refill: 0 -     Albuterol  Sulfate HFA; Inhale 2 puffs into the lungs every 6 (six) hours as needed for wheezing or shortness of breath.  Dispense: 6.7 g; Refill: 2 -     methylPREDNISolone ; Take 6 tablets (24 mg total) by mouth daily for 1 day, THEN 5 tablets (20 mg total) daily for 1 day, THEN 4 tablets (16 mg total) daily for 1 day, THEN 3 tablets (12 mg total) daily for 1 day, THEN 2 tablets (8 mg total) daily for 1 day, THEN 1 tablet (4 mg total) daily for 1 day.  Dispense: 21 tablet; Refill: 0   Recommending rest, hydration, rx tessalon , albuterol  inhaler. Pt reports a flushing/red reaction to prednisone  historically. Rx medrol  dose pack, but if having adverse reaction to d/c .  If symptoms persist despite tx f/b with office for further recommendations.  Return if symptoms worsen or fail to improve.       Trenton Frock, PA-C  [facility] [phone] (phone) [phone] (fax)  [facility]

## 2023-08-17 ENCOUNTER — Encounter: Payer: Self-pay | Admitting: Bariatrics

## 2023-08-17 ENCOUNTER — Ambulatory Visit: Admitting: Internal Medicine

## 2023-08-20 ENCOUNTER — Ambulatory Visit: Payer: Medicare Other | Admitting: Bariatrics

## 2023-08-28 ENCOUNTER — Encounter: Payer: Self-pay | Admitting: Bariatrics

## 2023-08-28 ENCOUNTER — Ambulatory Visit (INDEPENDENT_AMBULATORY_CARE_PROVIDER_SITE_OTHER): Admitting: Bariatrics

## 2023-08-28 VITALS — BP 108/72 | HR 63 | Temp 97.5°F | Ht 65.0 in | Wt 138.0 lb

## 2023-08-28 DIAGNOSIS — E669 Obesity, unspecified: Secondary | ICD-10-CM

## 2023-08-28 DIAGNOSIS — Z6822 Body mass index (BMI) 22.0-22.9, adult: Secondary | ICD-10-CM | POA: Diagnosis not present

## 2023-08-28 DIAGNOSIS — R7303 Prediabetes: Secondary | ICD-10-CM

## 2023-08-28 NOTE — Progress Notes (Signed)
 WEIGHT SUMMARY AND BIOMETRICS  Weight Lost Since Last Visit: 2lb  Weight Gained Since Last Visit: 0   Vitals Temp: (!) 97.5 F (36.4 C) BP: 108/72 Pulse Rate: 63 SpO2: 99 %   Anthropometric Measurements Height: 5\' 5"  (1.651 m) Weight: 138 lb (62.6 kg) BMI (Calculated): 22.96 Weight at Last Visit: 140lb Weight Lost Since Last Visit: 2lb Weight Gained Since Last Visit: 0 Starting Weight: 184lb Total Weight Loss (lbs): 46 lb (20.9 kg)   Body Composition  Body Fat %: 36.4 % Fat Mass (lbs): 50.4 lbs Muscle Mass (lbs): 83.6 lbs Total Body Water (lbs): 58.8 lbs Visceral Fat Rating : 10   Other Clinical Data Fasting: no Labs: no Today's Visit #: 16 Starting Date: 11/23/21    OBESITY [name] is here to discuss her progress with her obesity treatment plan along with follow-up of her obesity related diagnoses.    Nutrition Plan: the pescatarian plan - 40% adherence.  Current exercise: walking  Interim History:  She is 2 lbs down since her last visit.  She has been able to maintain her weight well and she is at her goal. Eating all of the food on the plan., Protein intake is as prescribed, Is not skipping meals, and Water intake is adequate.  Hunger is moderately controlled.  Cravings are moderately controlled.  Assessment/Plan:   Prediabetes Last A1c was 5.5  Medication(s): none  Lab Results  Component Value Date   HGBA1C 5.5 11/15/2022   HGBA1C 5.7 (H) 11/23/2021   HGBA1C 5.8 03/24/2021   HGBA1C 5.5 05/22/2015   Lab Results  Component Value Date   INSULIN  8.7 11/23/2021    Plan: Will minimize all refined carbohydrates both sweets and starches.  Will work on the plan and exercise.  Will do  both aerobic and resistance training.  Will keep protein, water, and fiber intake high.  Increase Polyunsaturated and Monounsaturated fats to  increase satiety and encourage weight loss.  Aim for 7 to 9 hours of sleep nightly.  She will continue to weigh herself periodically and will set a goal weight and not begin beyond that. She will continue to make good choices and to watch her portion size.    Generalized Obesity: Current BMI BMI (Calculated): 22.96  [name] is currently in the action stage of change. As such, her goal is to maintain weight for now.  She has agreed to the pescatarian plan.  Exercise goals: Older adults should follow the adult guidelines. When older adults cannot meet the adult guidelines, they should be as physically active as their abilities and conditions will allow.   Behavioral modification strategies: increasing lean protein intake, no meal skipping, meal planning , better snacking choices, planning for success, increasing vegetables, avoiding temptations, keep healthy foods in the home, increase frequency of journaling, and mindful eating.  [name] has agreed to follow-up with our clinic in  3 months.      Medications Discontinued During This Encounter  Medication Reason   fluticasone  (FLONASE ) 50 MCG/ACT nasal spray Patient Preference     No orders of the defined types were placed in this encounter.     Objective:   VITALS: Per patient if applicable, see vitals. GENERAL: Alert and in no acute distress. CARDIOPULMONARY: No increased WOB. Speaking in clear sentences.  PSYCH: Pleasant and cooperative. Speech normal rate and rhythm. Affect is appropriate. Insight and judgement are appropriate. Attention is focused, linear, and appropriate.  NEURO: Oriented as arrived to appointment on time with no prompting.   Attestation Statements:   This was prepared with the assistance of Engineer, civil (consulting).  Occasional wrong-word or sound-a-like substitutions may have occurred due to the inherent limitations of voice recognition   Kirk Peper, DO

## 2023-09-04 ENCOUNTER — Other Ambulatory Visit: Payer: Self-pay

## 2023-09-04 ENCOUNTER — Ambulatory Visit (INDEPENDENT_AMBULATORY_CARE_PROVIDER_SITE_OTHER): Admitting: Family

## 2023-09-04 ENCOUNTER — Other Ambulatory Visit: Payer: Self-pay | Admitting: Bariatrics

## 2023-09-04 ENCOUNTER — Encounter: Payer: Self-pay | Admitting: Family

## 2023-09-04 ENCOUNTER — Telehealth: Payer: Self-pay | Admitting: Internal Medicine

## 2023-09-04 ENCOUNTER — Other Ambulatory Visit (HOSPITAL_BASED_OUTPATIENT_CLINIC_OR_DEPARTMENT_OTHER): Payer: Self-pay

## 2023-09-04 VITALS — BP 124/72 | HR 63 | Temp 97.5°F | Ht 65.0 in | Wt 146.0 lb

## 2023-09-04 DIAGNOSIS — L03312 Cellulitis of back [any part except buttock]: Secondary | ICD-10-CM

## 2023-09-04 MED ORDER — HYDROCODONE-ACETAMINOPHEN 7.5-325 MG PO TABS
1.0000 | ORAL_TABLET | Freq: Three times a day (TID) | ORAL | 0 refills | Status: DC | PRN
  Filled 2023-09-04: qty 90, 30d supply, fill #0

## 2023-09-04 MED ORDER — DOXYCYCLINE HYCLATE 100 MG PO TABS
100.0000 mg | ORAL_TABLET | Freq: Two times a day (BID) | ORAL | 0 refills | Status: DC
  Filled 2023-09-04: qty 14, 7d supply, fill #0

## 2023-09-04 MED ORDER — TRIAMCINOLONE ACETONIDE 0.1 % EX CREA
1.0000 | TOPICAL_CREAM | Freq: Two times a day (BID) | CUTANEOUS | 0 refills | Status: DC
Start: 2023-09-04 — End: 2023-11-07
  Filled 2023-09-04: qty 30, 15d supply, fill #0

## 2023-09-04 MED ORDER — LINACLOTIDE 145 MCG PO CAPS
145.0000 ug | ORAL_CAPSULE | Freq: Every day | ORAL | 2 refills | Status: DC
Start: 2023-09-04 — End: 2024-02-27
  Filled 2023-09-04: qty 30, 30d supply, fill #0
  Filled 2023-10-30 – 2023-10-31 (×2): qty 30, 30d supply, fill #1
  Filled 2023-12-31: qty 30, 30d supply, fill #2

## 2023-09-04 NOTE — Progress Notes (Signed)
 [name] is a 78 y.o. female with the following history as recorded in [employer]:  Patient Active Problem List   Diagnosis Date Noted   BMI 24.0-24.9, adult 07/26/2022   Generalized obesity 05/15/2022   BMI 25.0-25.9,adult 05/15/2022   Other constipation 02/01/2022   Primary osteoarthritis of right hip 12/13/2021   Spondylosis without myelopathy or radiculopathy, lumbar region 12/13/2021   Prediabetes 11/24/2021   Other fatigue 11/23/2021   SOB (shortness of breath) on exertion 11/23/2021   Hyperlipidemia 11/23/2021   Hyperglycemia 11/23/2021   Vitamin D  deficiency 11/23/2021   Vitamin B 12 deficiency 11/23/2021   Class 1 obesity with serious comorbidity and body mass index (BMI) of 30.0 to 30.9 in adult 11/23/2021   Fusion of lumbar spine 12/03/2019   COPD (chronic obstructive pulmonary disease) (HCC) 08/22/2019   Spinal stenosis of lumbar region with neurogenic claudication 11/22/2018   Coronary artery disease due to lipid rich plaque 10/03/2018   Chronic back pain 03/15/2017   PCP NOTES >>>>>>>>>>>>>>>>>>>> 05/24/2015   Insomnia 09/01/2014   GERD (gastroesophageal reflux disease) 03/27/2013   Dysphagia, pharyngoesophageal phase 03/27/2013   Annual physical exam 06/12/2011   Sinusitis, chronic 02/02/2011   Osteoporosis 06/08/2010   Lipoma 09/30/2008   Migraine without aura 03/04/2007   Hypothyroidism 05/15/2006   History of colonic polyps 05/15/2006    Current Outpatient Medications  Medication Sig Dispense Refill   albuterol  (VENTOLIN  HFA) 108 (90 Base) MCG/ACT inhaler Inhale 2 puffs into the lungs every 6 (six) hours as needed for wheezing or shortness of breath. 6.7 g 2   aspirin  81 MG EC tablet Take 81 mg by mouth daily. Swallow whole.     benzonatate  (TESSALON ) 100 MG capsule Take 1 capsule (100 mg total) by mouth 2 (two) times daily as needed for cough. 30 capsule 0   calcium  carbonate (OS-CAL) 1250 (500 CA) MG chewable tablet Chew 1 tablet by mouth 2 (two) times  daily.     Cholecalciferol  (VITAMIN D3) 50 MCG (2000 UT) CHEW Chew 2,000 Units by mouth daily.     cycloSPORINE  (RESTASIS ) 0.05 % ophthalmic emulsion 1 drop 2 (two) times daily.     denosumab  (PROLIA ) 60 MG/ML SOSY injection Inject 60 mg into the skin every 6 (six) months. 1 mL 0   doxycycline  (VIBRA -TABS) 100 MG tablet Take 1 tablet (100 mg total) by mouth 2 (two) times daily. 14 tablet 0   Estradiol  (YUVAFEM ) 10 MCG TABS vaginal tablet PLACE 1 TABLET VAGINALLY 2 TIMES A WEEK. 24 tablet 3   fluticasone -salmeterol (WIXELA INHUB ) 100-50 MCG/ACT AEPB Inhale 1 puff into the lungs 2 (two) times daily. 60 each 5   hydrochlorothiazide  (MICROZIDE ) 12.5 MG capsule Take 1 capsule (12.5 mg total) by mouth daily. 90 capsule 1   HYDROcodone -acetaminophen  (NORCO) 7.5-325 MG tablet Take 1 tablet by mouth 3 (three) times daily as needed for moderate pain (pain score 4-6). 90 tablet 0   hydroxypropyl methylcellulose / hypromellose (ISOPTO TEARS / GONIOVISC) 2.5 % ophthalmic solution Place 1 drop into both eyes 3 (three) times daily as needed for dry eyes.     levothyroxine  (SYNTHROID ) 88 MCG tablet Take 1 tablet (88 mcg total) by mouth every morning before breakfast. 90 tablet 2   linaclotide  (LINZESS ) 145 MCG CAPS capsule Take 1 capsule (145 mcg total) by mouth daily before breakfast. 30 capsule 2   Multiple Vitamin (MULTIVITAMIN) capsule Take 1 capsule by mouth daily.     naloxone  (NARCAN ) 4 MG/0.1ML LIQD nasal spray kit Use as  directed 2 each 0   omeprazole  (PRILOSEC ) 40 MG capsule Take 1 capsule (40 mg total) by mouth daily 30 mins. before breakfast. 90 capsule 2   potassium chloride  (KLOR-CON  M) 10 MEQ tablet Take 1 tablet (10 mEq total) by mouth daily. 10 tablet 0   pravastatin  (PRAVACHOL ) 20 MG tablet Take 1 tablet (20 mg total) by mouth at bedtime. 90 tablet 1   triamcinolone  cream (KENALOG ) 0.1 % Apply 1 Application topically 2 (two) times daily. 30 g 0   vitamin E 1000 UNIT capsule      zolpidem   (AMBIEN ) 10 MG tablet Take 0.5-1 tablets (5-10 mg total) by mouth at bedtime as needed for sleep. 30 tablet 3   Current Facility-Administered Medications  Medication Dose Route Frequency Provider Last Rate Last Admin   [START ON 10/29/2023] denosumab  (PROLIA ) injection 60 mg  60 mg Subcutaneous Once Gherghe, Cristina, MD        Allergies: Alendronate sodium, Bactrim  [sulfamethoxazole -trimethoprim ], Contrast media [iodinated contrast media], Iodine, Milnacipran, Penicillins, and Prednisone   Past Medical History:  Diagnosis Date   Allergy see chart   Anxiety 06/2020   back pain   Arthritis    Back pain    Back pain    Cataract    both eyes rt. 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   Fibromyalgia    see rheumatology   GERD (gastroesophageal reflux disease)    PAST HX    High blood pressure    High cholesterol    Hyperlipidemia    Hypothyroidism    Joint pain    Migraine    Ocular rosacea 03/2009   on doxy   Osteoporosis    osteopenia   Pleuritic chest pain    etiology unclear, has beeb eval by pulmonary and rheumatology (autoimmune dz?)   Swelling of lower extremity    Wrist fracture 2018    Past Surgical History:  Procedure Laterality Date   ANTERIOR LAT LUMBAR FUSION N/A 12/03/2019   Procedure: ANTERIOR LATERAL LUMBAR FUSION L2-4;  Surgeon: Mort Ards, MD;  Location: [facility] OR;  Service: Orthopedics;  Laterality: N/A;  4 hrs   COLONOSCOPY     COSMETIC SURGERY  04/12/2018   L upper eyelid ptosis repair, and trichophytic brow lift   ESOPHAGOGASTRODUODENOSCOPY (EGD) WITH PROPOFOL  N/A 03/27/2013   Procedure: ESOPHAGOGASTRODUODENOSCOPY (EGD) WITH PROPOFOL ;  Surgeon: Janel Medford, MD;  Location: [facility];  Service: Endoscopy;  Laterality: N/A;  possible dil    EYE SURGERY     growth on cornea-bil.   FRACTURE SURGERY  hand 2017   plastic surgery face  2013   POLYPECTOMY     right rotator cuff repair  2010   SPINE SURGERY     TONSILLECTOMY     UPPER GASTROINTESTINAL ENDOSCOPY      Family History  Adopted: Yes  Problem Relation Age of Onset   Cancer Neg Hx    Depression Neg Hx    Diabetes Neg Hx    Stroke Neg Hx    Hypertension Neg Hx    Heart disease Neg Hx     Social History   Tobacco Use   Smoking status: Former    Current packs/day: 0.00    Average packs/day:  1 pack/day for 38.5 years (38.5 ttl pk-yrs)    Types: Cigarettes    Start date: 07/14/1965    Quit date: 01/13/2004    Years since quitting: 19.6   Smokeless tobacco: Never   Tobacco comments:    quit 2005  Substance Use Topics   Alcohol use: No    Alcohol/week: 0.0 standard drinks of alcohol    Comment: rare    Subjective:   Concern about localized area of redness of right lower back; notes that area does itch slightly and does burn; has had shingles vaccine; has tried Neosporin with limited relief; denies any new soaps, foods, detergents or medications.    Objective:  Vitals:   09/04/23 1120  BP: 124/72  Pulse: 63  Temp: (!) 97.5 F (36.4 C)  TempSrc: Oral  SpO2: 96%  Weight: 146 lb (66.2 kg)  Height: 5\' 5"  (1.651 m)    General: Well developed, well nourished, in no acute distress  Skin : Warm and dry. Localized area of redness with surrounding erythema; no vesicular lesions noted;  Head: Normocephalic and atraumatic  Eyes: Sclera and conjunctiva clear; pupils round and reactive to light; extraocular movements intact  Lungs: Respirations unlabored;  Neurologic: Alert and oriented; speech intact; face symmetrical; moves all extremities well; CNII-XII intact without focal deficit   Assessment:  1. Cellulitis of back except buttock     Plan:  Presentation is more consistent with insect bite as opposed to shingles; Rx for Doxycyline and Triamcinolone ;  follow up worse, no better.   No follow-ups on file.  No orders of the defined types were placed in this encounter.   Requested Prescriptions   Signed Prescriptions Disp Refills   doxycycline  (VIBRA -TABS) 100 MG tablet 14 tablet 0    Sig: Take 1 tablet (100 mg total) by mouth 2 (two) times daily.   triamcinolone  cream (KENALOG ) 0.1 % 30 g 0    Sig: Apply 1 Application topically 2 (two) times daily.

## 2023-09-04 NOTE — Telephone Encounter (Signed)
 PDMP okay, Rx sent

## 2023-09-04 NOTE — Telephone Encounter (Signed)
 Requesting: hydrocodone  7.5-325mg   Contract: 11/23/22 UDS: 05/21/23 Last Visit: 05/21/23 Next Visit: 12/04/23 Last Refill: 08/06/23 #90 and 0RF   Please Advise

## 2023-09-11 ENCOUNTER — Ambulatory Visit (INDEPENDENT_AMBULATORY_CARE_PROVIDER_SITE_OTHER)

## 2023-09-11 VITALS — Ht 65.0 in | Wt 146.0 lb

## 2023-09-11 DIAGNOSIS — Z0000 Encounter for general adult medical examination without abnormal findings: Secondary | ICD-10-CM

## 2023-09-11 NOTE — Progress Notes (Signed)
 Subjective:   [name] is a 78 y.o. who presents for a Medicare Wellness preventive visit.  As a reminder, Annual Wellness Visits don't include a physical exam, and some assessments may be limited, especially if this visit is performed virtually. We may recommend an in-person follow-up visit with your provider if needed.  Visit Complete: Virtual I connected with  [name] on 09/11/23 by a audio enabled telemedicine application and verified that I am speaking with the correct person using two identifiers.  Patient Location: Home  Provider Location: Home Office  I discussed the limitations of evaluation and management by telemedicine. The patient expressed understanding and agreed to proceed.  Vital Signs: Because this visit was a virtual/telehealth visit, some criteria may be missing or patient reported. Any vitals not documented were not able to be obtained and vitals that have been documented are patient reported.  VideoDeclined- This patient declined Librarian, academic. Therefore the visit was completed with audio only.  Persons Participating in Visit: Patient.  AWV Questionnaire: Yes: Patient Medicare AWV questionnaire was completed by the patient on 09/04/23; I have confirmed that all information answered by patient is correct and no changes since this date.  Cardiac Risk Factors include: advanced age (>65men, >54 women);dyslipidemia;hypertension;sedentary lifestyle     Objective:     Today's Vitals   09/11/23 0945  Weight: 146 lb (66.2 kg)  Height: 5\' 5"  (1.651 m)   Body mass index is 24.3 kg/m.     09/11/2023    9:50 AM 08/22/2022    8:28 AM 08/18/2021    8:28 AM 06/03/2021   10:09 AM 10/28/2020   11:06 AM 08/12/2020    8:34 AM 08/10/2020   10:17 AM  Advanced Directives  Does Patient Have a Medical Advance Directive? Yes Yes Yes Yes Yes Yes Yes  Type of Estate agent of [place];Living will Healthcare Power of  [place];Living will Healthcare Power of [place];Out of facility DNR (pink MOST or yellow form);Living will Healthcare Power of [place];Living will;Out of facility DNR (pink MOST or yellow form) Healthcare Power of [place];Living will Healthcare Power of [place];Living will   Does patient want to make changes to medical advance directive? No - Patient declined No - Patient declined     No - Patient declined  Copy of Healthcare Power of Attorney in Chart? Yes - validated most recent copy scanned in chart (See row information) Yes - validated most recent copy scanned in chart (See row information) Yes - validated most recent copy scanned in chart (See row information)  No - copy requested Yes - validated most recent copy scanned in chart (See row information)     Current Medications (verified) Outpatient Encounter Medications as of 09/11/2023  Medication Sig   albuterol  (VENTOLIN  HFA) 108 (90 Base) MCG/ACT inhaler Inhale 2 puffs into the lungs every 6 (six) hours as needed for wheezing or shortness of breath.   aspirin  81 MG EC tablet Take 81 mg by mouth daily. Swallow whole.   benzonatate  (TESSALON ) 100 MG capsule Take 1 capsule (100 mg total) by mouth 2 (two) times daily as needed for cough.   calcium  carbonate (OS-CAL) 1250 (500 CA) MG chewable tablet Chew 1 tablet by mouth 2 (two) times daily.   Cholecalciferol  (VITAMIN D3) 50 MCG (2000 UT) CHEW Chew 2,000 Units by mouth daily.   cycloSPORINE  (RESTASIS ) 0.05 % ophthalmic emulsion 1 drop 2 (two) times daily.   denosumab  (PROLIA ) 60 MG/ML SOSY injection Inject 60  mg into the skin every 6 (six) months.   doxycycline  (VIBRA -TABS) 100 MG tablet Take 1 tablet (100 mg total) by mouth 2 (two) times daily.   Estradiol  (YUVAFEM ) 10 MCG TABS vaginal tablet PLACE 1 TABLET VAGINALLY 2 TIMES A WEEK.   fluticasone -salmeterol (WIXELA INHUB ) 100-50 MCG/ACT AEPB Inhale 1 puff into the lungs 2 (two) times daily.   hydrochlorothiazide  (MICROZIDE ) 12.5 MG capsule  Take 1 capsule (12.5 mg total) by mouth daily.   HYDROcodone -acetaminophen  (NORCO) 7.5-325 MG tablet Take 1 tablet by mouth 3 (three) times daily as needed for moderate pain (pain score 4-6).   hydroxypropyl methylcellulose / hypromellose (ISOPTO TEARS / GONIOVISC) 2.5 % ophthalmic solution Place 1 drop into both eyes 3 (three) times daily as needed for dry eyes.   levothyroxine  (SYNTHROID ) 88 MCG tablet Take 1 tablet (88 mcg total) by mouth every morning before breakfast.   linaclotide  (LINZESS ) 145 MCG CAPS capsule Take 1 capsule (145 mcg total) by mouth daily before breakfast.   Multiple Vitamin (MULTIVITAMIN) capsule Take 1 capsule by mouth daily.   naloxone  (NARCAN ) 4 MG/0.1ML LIQD nasal spray kit Use as directed   omeprazole  (PRILOSEC ) 40 MG capsule Take 1 capsule (40 mg total) by mouth daily 30 mins. before breakfast.   potassium chloride  (KLOR-CON  M) 10 MEQ tablet Take 1 tablet (10 mEq total) by mouth daily.   pravastatin  (PRAVACHOL ) 20 MG tablet Take 1 tablet (20 mg total) by mouth at bedtime.   triamcinolone  cream (KENALOG ) 0.1 % Apply 1 Application topically 2 (two) times daily.   vitamin E 1000 UNIT capsule    zolpidem  (AMBIEN ) 10 MG tablet Take 0.5-1 tablets (5-10 mg total) by mouth at bedtime as needed for sleep.   Facility-Administered Encounter Medications as of 09/11/2023  Medication   [START ON 10/29/2023] denosumab  (PROLIA ) injection 60 mg    Allergies (verified) Alendronate sodium, Bactrim  [sulfamethoxazole -trimethoprim ], Contrast media [iodinated contrast media], Iodine, Milnacipran, Penicillins, and Prednisone    History: Past Medical History:  Diagnosis Date   Allergy see chart   Anxiety 06/2020   back pain   Arthritis    Back pain    Back pain    Cataract    both eyes rt. 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   Fibromyalgia    see rheumatology   GERD (gastroesophageal reflux disease)    PAST HX    High blood pressure    High cholesterol    Hyperlipidemia    Hypothyroidism    Joint pain    Migraine    Ocular rosacea 03/2009   on doxy   Osteoporosis    osteopenia   Pleuritic chest pain    etiology unclear, has beeb eval by pulmonary and rheumatology (autoimmune dz?)   Swelling of lower extremity    Wrist fracture 2018   Past Surgical History:  Procedure Laterality Date   ANTERIOR LAT LUMBAR FUSION N/A 12/03/2019   Procedure: ANTERIOR LATERAL LUMBAR FUSION L2-4;  Surgeon: Mort Ards, MD;  Location: [facility] OR;  Service: Orthopedics;  Laterality: N/A;  4 hrs   COLONOSCOPY     COSMETIC SURGERY  04/12/2018   L upper eyelid ptosis repair, and trichophytic brow lift   ESOPHAGOGASTRODUODENOSCOPY (EGD) WITH PROPOFOL  N/A 03/27/2013  Procedure: ESOPHAGOGASTRODUODENOSCOPY (EGD) WITH PROPOFOL ;  Surgeon: Janel Medford, MD;  Location: [facility];  Service: Endoscopy;  Laterality: N/A;  possible dil   EYE SURGERY     growth on cornea-bil.   FRACTURE SURGERY  hand 2017   plastic surgery face  2013   POLYPECTOMY     right rotator cuff repair  2010   SPINE SURGERY     TONSILLECTOMY     UPPER GASTROINTESTINAL ENDOSCOPY     Family History  Adopted: Yes  Problem Relation Age of Onset   Cancer Neg Hx    Depression Neg Hx    Diabetes Neg Hx    Stroke Neg Hx    Hypertension Neg Hx    Heart disease Neg Hx    Social History   Socioeconomic History   Marital status: Married    Spouse name: Andy Bannister   Number of children: 0   Years of education: Not on file   Highest education level: Some college, no degree  Occupational History   Occupation: retired, Warehouse manager work    Associate Professor: RETIRED  Tobacco Use   Smoking status: Former    Current packs/day: 0.00    Average packs/day: 1 pack/day for 38.5 years (38.5 ttl pk-yrs)    Types:  Cigarettes    Start date: 07/14/1965    Quit date: 01/13/2004    Years since quitting: 19.6   Smokeless tobacco: Never   Tobacco comments:    quit 2005  Vaping Use   Vaping status: Never Used  Substance and Sexual Activity   Alcohol use: No    Comment: rare   Drug use: No   Sexual activity: Not Currently    Comment: first intercourse >16, more than 5 partners  Other Topics Concern   Not on file  Social History Narrative   No biological children      Social Drivers of Health   Financial Resource Strain: Low Risk  (09/11/2023)   Overall Financial Resource Strain (CARDIA)    Difficulty of Paying Living Expenses: Not hard at all  Food Insecurity: No Food Insecurity (09/11/2023)   Hunger Vital Sign    Worried About Running Out of Food in the Last Year: Never true    Ran Out of Food in the Last Year: Never true  Transportation Needs: No Transportation Needs (09/11/2023)   PRAPARE - Administrator, Civil Service (Medical): No    Lack of Transportation (Non-Medical): No  Physical Activity: Insufficiently Active (09/11/2023)   Exercise Vital Sign    Days of Exercise per Week: 2 days    Minutes of Exercise per Session: 20 min  Stress: No Stress Concern Present (09/11/2023)   [employer] of Occupational Health - Occupational Stress Questionnaire    Feeling of Stress : Not at all  Social Connections: Moderately Integrated (09/11/2023)   Social Connection and Isolation Panel [NHANES]    Frequency of Communication with Friends and Family: More than three times a week    Frequency of Social Gatherings with Friends and Family: Twice a week    Attends Religious Services: Never    Database administrator or Organizations: Yes    Attends Engineer, structural: More than 4 times per year    Marital Status: Married    Tobacco Counseling Counseling given: Not Answered Tobacco comments: quit 2005    Clinical Intake:  Pre-visit preparation completed: Yes  Pain :  No/denies pain     Diabetes: No  Lab Results  Component Value Date   HGBA1C 5.5 11/15/2022   HGBA1C 5.7 (H) 11/23/2021   HGBA1C 5.8 03/24/2021     How often do you need to have someone help you when you read instructions, pamphlets, or other written materials from your doctor or pharmacy?: 1 - Never  Interpreter Needed?: No  Information entered by :: Seabron Cypress LPN   Activities of Daily Living     09/04/2023    8:35 AM  In your present state of health, do you have any difficulty performing the following activities:  Hearing? 0  Vision? 0  Difficulty concentrating or making decisions? 0  Walking or climbing stairs? 0  Dressing or bathing? 0  Doing errands, shopping? 0  Preparing Food and eating ? N  Using the Toilet? N  In the past six months, have you accidently leaked urine? N  Do you have problems with loss of bowel control? N  Managing your Medications? N  Managing your Finances? N  Housekeeping or managing your Housekeeping? N    Patient Care Team: Ezell Hollow, MD as PCP - General Loyde Rule, MD as PCP - Cardiology (Cardiology) Jorie Newness, Blondie Burke, MD as Consulting Physician (Obstetrics and Gynecology) Zaldivar, Renzo A, MD as Attending Physician (Ophthalmology) Mort Ards, MD as Consulting Physician (Orthopedic Surgery) Keenan Pastor (Optometry) Emilie Harden, MD as Consulting Physician (Internal Medicine) Cherylin Corrigan, MD as Referring Physician (Dermatology)  I have updated your Care Teams any recent Medical Services you may have received from other providers in the past year.     Assessment:    This is a routine wellness examination for [place].  Hearing/Vision screen Hearing Screening - Comments:: Denies hearing difficulties   Vision Screening - Comments:: Wears rx glasses - up to date with routine eye exams with Dr. Johnetta Nab    Goals Addressed             This Visit's Progress    DIET - INCREASE WATER INTAKE   On track       Depression Screen     09/11/2023    9:48 AM 05/21/2023   10:28 AM 01/04/2023    1:23 PM 12/27/2022    1:06 PM 12/05/2022   10:29 AM 11/15/2022    8:13 AM 08/22/2022    8:35 AM  PHQ 2/9 Scores  PHQ - 2 Score 0 0 0 0 0 0 0    Fall Risk     09/04/2023    8:35 AM 05/21/2023   10:28 AM 01/04/2023    1:23 PM 12/27/2022    1:06 PM 12/05/2022   10:28 AM  Fall Risk   Falls in the past year? 0 0 0 0 0  Number falls in past yr: 0 0 0 0 0  Injury with Fall? 0 0 0 0 0  Risk for fall due to : No Fall Risks      Follow up Falls prevention discussed;Education provided;Falls evaluation completed Falls evaluation completed;Education provided Falls evaluation completed Falls evaluation completed Falls evaluation completed    MEDICARE RISK AT HOME:  Medicare Risk at Home Any stairs in or around the home?: (Patient-Rptd) Yes If so, are there any without handrails?: (Patient-Rptd) No Home free of loose throw rugs in walkways, pet beds, electrical cords, etc?: (Patient-Rptd) No Adequate lighting in your home to reduce risk of falls?: (Patient-Rptd) Yes Life alert?: (Patient-Rptd) No Use of a cane, walker or w/c?: (Patient-Rptd) No Grab bars in the bathroom?: (Patient-Rptd) Yes Shower chair or  bench in shower?: (Patient-Rptd) No Elevated toilet seat or a handicapped toilet?: (Patient-Rptd) Yes  TIMED UP AND GO:  Was the test performed?  No  Cognitive Function: 6CIT completed    08/22/2022    9:01 AM 12/01/2015   10:01 AM  MMSE - Mini Mental State Exam  Not completed: Unable to complete   Orientation to time  5  Orientation to Place  5  Registration  3  Attention/ Calculation  5  Recall  2  Language- name 2 objects  2  Language- repeat  1  Language- follow 3 step command  3  Language- read & follow direction  1  Write a sentence  1  Copy design  1  Total score  29        09/11/2023    9:50 AM 08/18/2021    8:35 AM  6CIT Screen  What Year? 0 points 0 points  What month? 0 points 0  points  What time? 0 points 0 points  Count back from 20 0 points 0 points  Months in reverse 0 points 0 points  Repeat phrase 0 points 0 points  Total Score 0 points 0 points    Immunizations Immunization History  Administered Date(s) Administered   Fluad Quad(high Dose 65+) 12/13/2018, 01/30/2022   Fluad Trivalent(High Dose 65+) 02/01/2023   H1N1 04/09/2008   Influenza Split 02/28/2012   Influenza Whole 03/04/2007, 01/29/2009, 02/07/2010   Influenza, High Dose Seasonal PF 01/07/2015, 01/04/2016, 01/10/2017, 01/19/2020, 12/26/2020   Influenza,inj,Quad PF,6+ Mos 01/07/2013, 02/10/2014   Influenza,inj,quad, With Preservative 01/14/2018   PFIZER(Purple Top)SARS-COV-2 Vaccination 05/24/2019, 06/15/2019, 01/24/2020   PNEUMOCOCCAL CONJUGATE-20 11/10/2021   Pfizer Covid-19 Vaccine Bivalent Booster 82yrs & up 01/19/2021   Pfizer(Comirnaty )Fall Seasonal Vaccine 12 years and older 03/14/2022   Pneumococcal Conjugate-13 08/08/2013   Pneumococcal Polysaccharide-23 05/11/2004, 06/12/2011   Td 02/08/2006, 09/08/2015   Zoster Recombinant(Shingrix ) 11/21/2018, 03/13/2019   Zoster, Live 06/16/2010    Screening Tests Health Maintenance  Topic Date Due   COVID-19 Vaccine (6 - 2024-25 season) 12/10/2022   INFLUENZA VACCINE  11/09/2023   Medicare Annual Wellness (AWV)  09/10/2024   Colonoscopy  09/03/2025   DTaP/Tdap/Td (3 - Tdap) 09/07/2025   Pneumonia Vaccine 24+ Years old  Completed   DEXA SCAN  Completed   Hepatitis C Screening  Completed   Zoster Vaccines- Shingrix   Completed   HPV VACCINES  Aged Out   Meningococcal B Vaccine  Aged Out    Health Maintenance  Health Maintenance Due  Topic Date Due   COVID-19 Vaccine (6 - 2024-25 season) 12/10/2022    Additional Screening:  Vision Screening: Recommended annual ophthalmology exams for early detection of glaucoma and other disorders of the eye. Would you like a referral to an eye doctor? No    Dental Screening: Recommended  annual dental exams for proper oral hygiene  Community Resource Referral / Chronic Care Management: CRR required this visit?  No   CCM required this visit?  No   Plan:    I have personally reviewed and noted the following in the patient's chart:   Medical and social history Use of alcohol, tobacco or illicit drugs  Current medications and supplements including opioid prescriptions. Patient is currently taking opioid prescriptions. Information provided to patient regarding non-opioid alternatives. Patient advised to discuss non-opioid treatment plan with their provider. Functional ability and status Nutritional status Physical activity Advanced directives List of other physicians Hospitalizations, surgeries, and ER visits in previous 12 months Vitals Screenings to include  cognitive, depression, and falls Referrals and appointments  In addition, I have reviewed and discussed with patient certain preventive protocols, quality metrics, and best practice recommendations. A written personalized care plan for preventive services as well as general preventive health recommendations were provided to patient.   Seabron Cypress [place], [place]   4/0/9811   After Visit Summary: (MyChart) Due to this being a telephonic visit, the after visit summary with patients personalized plan was offered to patient via MyChart   Notes: Nothing significant to report at this time.

## 2023-09-11 NOTE — Patient Instructions (Signed)
 Ms. Nephew , Thank you for taking time out of your busy schedule to complete your Annual Wellness Visit with me. I enjoyed our conversation and look forward to speaking with you again next year. I, as well as your care team,  appreciate your ongoing commitment to your health goals. Please review the following plan we discussed and let me know if I can assist you in the future. Your Game plan/ To Do List     Follow up Visits: Next Medicare AWV with our clinical staff: In 1 year    Have you seen your provider in the last 6 months (3 months if uncontrolled diabetes)? Yes Next Office Visit with your provider: 12/04/23 @ 9:00  Clinician Recommendations:  Aim for 30 minutes of exercise or brisk walking, 6-8 glasses of water, and 5 servings of fruits and vegetables each day.       This is a list of the screening recommended for you and due dates:  Health Maintenance  Topic Date Due   COVID-19 Vaccine (6 - 2024-25 season) 12/10/2022   Flu Shot  11/09/2023   Medicare Annual Wellness Visit  09/10/2024   Colon Cancer Screening  09/03/2025   DTaP/Tdap/Td vaccine (3 - Tdap) 09/07/2025   Pneumonia Vaccine  Completed   DEXA scan (bone density measurement)  Completed   Hepatitis C Screening  Completed   Zoster (Shingles) Vaccine  Completed   HPV Vaccine  Aged Out   Meningitis B Vaccine  Aged Out    Advanced directives: (In Chart) A copy of your advanced directives are scanned into your chart should your provider ever need it.  Advance Care Planning is important because it:  [x]  Makes sure you receive the medical care that is consistent with your values, goals, and preferences  [x]  It provides guidance to your family and loved ones and reduces their decisional burden about whether or not they are making the right decisions based on your wishes.  Follow the link provided in your after visit summary or read over the paperwork we have mailed to you to help you started getting your Advance Directives in  place. If you need assistance in completing these, please reach out to us  so that we can help you!  See attachments for Preventive Care and Fall Prevention Tips.

## 2023-09-20 ENCOUNTER — Ambulatory Visit: Admitting: Sports Medicine

## 2023-09-20 ENCOUNTER — Other Ambulatory Visit: Payer: Self-pay

## 2023-09-20 ENCOUNTER — Encounter: Payer: Self-pay | Admitting: Sports Medicine

## 2023-09-20 DIAGNOSIS — M1611 Unilateral primary osteoarthritis, right hip: Secondary | ICD-10-CM | POA: Diagnosis not present

## 2023-09-20 DIAGNOSIS — M25551 Pain in right hip: Secondary | ICD-10-CM

## 2023-09-20 DIAGNOSIS — M810 Age-related osteoporosis without current pathological fracture: Secondary | ICD-10-CM | POA: Diagnosis not present

## 2023-09-20 MED ORDER — LIDOCAINE HCL 1 % IJ SOLN
4.0000 mL | INTRAMUSCULAR | Status: AC | PRN
  Administered 2023-09-20: 4 mL

## 2023-09-20 MED ORDER — METHYLPREDNISOLONE ACETATE 40 MG/ML IJ SUSP
40.0000 mg | INTRAMUSCULAR | Status: AC | PRN
  Administered 2023-09-20: 40 mg via INTRA_ARTICULAR

## 2023-09-20 NOTE — Progress Notes (Signed)
 [name] - 78 y.o. female MRN [id number]  Date of birth: [birth year]  Office Visit Note: Visit Date: 09/20/2023 PCP: Ezell Hollow, MD Referred by: Ezell Hollow, MD  Subjective: Chief Complaint  Patient presents with   Right Hip - Pain   HPI: [name] is a pleasant 78 y.o. female who presents today for acute on chronic right hip pain with known OA.  In the past, we did perform an ultrasound-guided hip injection back in December of last year which gave her very good relief for at least 4 months and then pain has started to slowly return.  Her pain is at the point now that she is having difficulties with sleeping as well as walking and other vigorous activity.  She is managed on Norco 7.5-325 mg which she takes once to twice daily depending on her pain and activity level.  Pertinent ROS were reviewed with the patient and found to be negative unless otherwise specified above in HPI.   Assessment & Plan: Visit Diagnoses:  1. Unilateral primary osteoarthritis, right hip   2. Pain of right hip   3. Age-related osteoporosis without current pathological fracture    Plan: Impression is exacerbation of chronic right hip pain with moderate osteoarthritis.  She received excellent relief of her pain for at least 4 months from previous ultrasound-guided intra-articular injection.  Given her concomitant osteoporosis, would favor waiting about 6 months between injections, which she has done today.  Through shared decision making, we did repeat ultrasound-guided right hip injection, patient tolerated well.  Advised on postinjection protocol.  She may use ice/heat or Tylenol  for any postinjection pain.  She is managed on chronic pain medication with Norco 7.5-325 mg which she will take once to twice daily as needed for her hip pain and chronic pain.  We did have a discussion today regarding hip replacement and when this should be considered if she is no longer having good or prolonged relief from  injection therapy.  She will follow-up with Dr. Lucienne Ryder and/or myself as needed.  Did discuss the role of infrequent hip injections.   Follow-up: Return if symptoms worsen or fail to improve, for with Lucienne Ryder or Vaughn Georges.   Meds & Orders: No orders of the defined types were placed in this encounter.   Orders Placed This Encounter  Procedures   Large Joint Inj   US  Guided Needle Placement - No Linked Charges     Procedures: Large Joint Inj: R hip joint on 09/20/2023 9:41 AM Indications: pain Details: 22 G 3.5 in needle, ultrasound-guided anterior approach Medications: 4 mL lidocaine  1 %; 40 mg methylPREDNISolone  acetate 40 MG/ML Outcome: tolerated well, no immediate complications  Procedure: US -guided intra-articular hip injection, Right After discussion on risks/benefits/indications and informed verbal consent was obtained, a timeout was performed. Patient was lying supine on exam table. The hip was cleaned with betadine and alcohol swabs. Then utilizing ultrasound guidance, the patient's femoral head and neck junction was identified and subsequently injected with 4:1 lidocaine :depomedrol via an in-plane approach with ultrasound visualization of the injectate administered into the hip joint. Patient tolerated procedure well without immediate complications.  Procedure, treatment alternatives, risks and benefits explained, specific risks discussed. Consent was given by the patient. Immediately prior to procedure a time out was called to verify the correct patient, procedure, equipment, support staff and site/side marked as required. Patient was prepped and draped in the usual sterile fashion.          Clinical  History: No specialty comments available.  She reports that she quit smoking about 19 years ago. Her smoking use included cigarettes. She started smoking about 58 years ago. She has a 38.5 pack-year smoking history. She has never used smokeless tobacco.  Recent Labs     11/15/22 0906  HGBA1C 5.5    Objective:   Vital Signs: LMP 04/10/1998 (Approximate)   Physical Exam  Gen: Well-appearing, in no acute distress; non-toxic CV: Well-perfused. Warm.  Resp: Breathing unlabored on room air; no wheezing. Psych: Fluid speech in conversation; appropriate affect; normal thought process  Ortho Exam - Right hip: There is no redness swelling or effusion about the hip.  There is mild TTP with deep palpation over the anterior joint recess.  Positive FADIR testing.  Imaging:  *Independent review and interpretation of right hip x-ray from 03/07/2023 shows moderate osteoarthritic change with joint space narrowing.  There is associated osteophytosis over the acetabulum and the superior lateral femoral head with a small drop osteophyte.  There is no femoral head or neck collapse.  There is evidence of early diminished BMD.   -03/07/2023: An AP pelvis and lateral right hip shows moderate to significant arthritic  findings of the right hip.  There is superior lateral joint space  narrowing.  There is osteophytes of the superior lateral acetabulum and  femoral head and inferior osteophytes of the femoral head.  There is mild  arthritic changes in the left hip on the AP view   Past Medical/Family/Surgical/Social History: Medications & Allergies reviewed per EMR, new medications updated. Patient Active Problem List   Diagnosis Date Noted   BMI 24.0-24.9, adult 07/26/2022   Generalized obesity 05/15/2022   BMI 25.0-25.9,adult 05/15/2022   Other constipation 02/01/2022   Primary osteoarthritis of right hip 12/13/2021   Spondylosis without myelopathy or radiculopathy, lumbar region 12/13/2021   Prediabetes 11/24/2021   Other fatigue 11/23/2021   SOB (shortness of breath) on exertion 11/23/2021   Hyperlipidemia 11/23/2021   Hyperglycemia 11/23/2021   Vitamin D  deficiency 11/23/2021   Vitamin B 12 deficiency 11/23/2021   Class 1 obesity with serious comorbidity and  body mass index (BMI) of 30.0 to 30.9 in adult 11/23/2021   Fusion of lumbar spine 12/03/2019   COPD (chronic obstructive pulmonary disease) (HCC) 08/22/2019   Spinal stenosis of lumbar region with neurogenic claudication 11/22/2018   Coronary artery disease due to lipid rich plaque 10/03/2018   Chronic back pain 03/15/2017   PCP NOTES >>>>>>>>>>>>>>>>>>>> 05/24/2015   Insomnia 09/01/2014   GERD (gastroesophageal reflux disease) 03/27/2013   Dysphagia, pharyngoesophageal phase 03/27/2013   Annual physical exam 06/12/2011   Sinusitis, chronic 02/02/2011   Osteoporosis 06/08/2010   Lipoma 09/30/2008   Migraine without aura 03/04/2007   Hypothyroidism 05/15/2006   History of colonic polyps 05/15/2006   Past Medical History:  Diagnosis Date   Allergy see chart   Anxiety 06/2020   back pain   Arthritis    Back pain    Back pain    Cataract    both eyes rt. 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   Fibromyalgia    see rheumatology   GERD (gastroesophageal reflux disease)    PAST HX    High blood pressure  High cholesterol    Hyperlipidemia    Hypothyroidism    Joint pain    Migraine    Ocular rosacea 03/2009   on doxy   Osteoporosis    osteopenia   Pleuritic chest pain    etiology unclear, has beeb eval by pulmonary and rheumatology (autoimmune dz?)   Swelling of lower extremity    Wrist fracture 2018   Family History  Adopted: Yes  Problem Relation Age of Onset   Cancer Neg Hx    Depression Neg Hx    Diabetes Neg Hx    Stroke Neg Hx    Hypertension Neg Hx    Heart disease Neg Hx    Past Surgical History:  Procedure Laterality Date   ANTERIOR LAT LUMBAR FUSION N/A 12/03/2019   Procedure: ANTERIOR LATERAL LUMBAR FUSION L2-4;  Surgeon: Mort Ards,  MD;  Location: [facility] OR;  Service: Orthopedics;  Laterality: N/A;  4 hrs   COLONOSCOPY     COSMETIC SURGERY  04/12/2018   L upper eyelid ptosis repair, and trichophytic brow lift   ESOPHAGOGASTRODUODENOSCOPY (EGD) WITH PROPOFOL  N/A 03/27/2013   Procedure: ESOPHAGOGASTRODUODENOSCOPY (EGD) WITH PROPOFOL ;  Surgeon: Janel Medford, MD;  Location: [facility];  Service: Endoscopy;  Laterality: N/A;  possible dil   EYE SURGERY     growth on cornea-bil.   FRACTURE SURGERY  hand 2017   plastic surgery face  2013   POLYPECTOMY     right rotator cuff repair  2010   SPINE SURGERY     TONSILLECTOMY     UPPER GASTROINTESTINAL ENDOSCOPY     Social History   Occupational History   Occupation: retired, Warehouse manager work    Associate Professor: RETIRED  Tobacco Use   Smoking status: Former    Current packs/day: 0.00    Average packs/day: 1 pack/day for 38.5 years (38.5 ttl pk-yrs)    Types: Cigarettes    Start date: 07/14/1965    Quit date: 01/13/2004    Years since quitting: 19.6   Smokeless tobacco: Never   Tobacco comments:    quit 2005  Vaping Use   Vaping status: Never Used  Substance and Sexual Activity   Alcohol use: No    Comment: rare   Drug use: No   Sexual activity: Not Currently    Comment: first intercourse >16, more than 5 partners

## 2023-09-20 NOTE — Progress Notes (Signed)
 Patient got about 4 months of good relief from the injection. She was advised to wait about 6 months before repeating the injection, so she is interested in doing that today. She denies any new injury, and says that she takes Hydrocodone  when her pain gets really bad.

## 2023-10-02 ENCOUNTER — Telehealth: Payer: Self-pay | Admitting: Internal Medicine

## 2023-10-02 NOTE — Telephone Encounter (Signed)
 Prolia  VOB initiated via [email or web address]  Next Prolia  inj DUE: 10/30/23

## 2023-10-03 ENCOUNTER — Other Ambulatory Visit (HOSPITAL_BASED_OUTPATIENT_CLINIC_OR_DEPARTMENT_OTHER): Payer: Self-pay

## 2023-10-03 MED ORDER — HYDROCODONE-ACETAMINOPHEN 7.5-325 MG PO TABS
1.0000 | ORAL_TABLET | Freq: Three times a day (TID) | ORAL | 0 refills | Status: DC | PRN
  Filled 2023-10-03: qty 90, 30d supply, fill #0

## 2023-10-03 MED ORDER — ZOLPIDEM TARTRATE 10 MG PO TABS
5.0000 mg | ORAL_TABLET | Freq: Every evening | ORAL | 3 refills | Status: DC | PRN
  Filled 2023-10-03: qty 30, 30d supply, fill #0
  Filled 2023-10-30 – 2023-10-31 (×2): qty 30, 30d supply, fill #1
  Filled 2023-11-28: qty 30, 30d supply, fill #2
  Filled 2023-12-26 – 2023-12-28 (×2): qty 30, 30d supply, fill #3

## 2023-10-03 NOTE — Telephone Encounter (Signed)
 Requesting: hydrocodone  and Ambien   Contract: 11/23/22 UDS: 05/21/23 Last Visit: 05/21/23 Next Visit: 12/04/23 Last Refill: see med list   Please Advise

## 2023-10-03 NOTE — Telephone Encounter (Signed)
 PDMP okay, Rx sent

## 2023-10-10 ENCOUNTER — Other Ambulatory Visit (HOSPITAL_BASED_OUTPATIENT_CLINIC_OR_DEPARTMENT_OTHER): Payer: Self-pay

## 2023-10-10 ENCOUNTER — Other Ambulatory Visit (HOSPITAL_COMMUNITY): Payer: Self-pay

## 2023-10-10 DIAGNOSIS — H04202 Unspecified epiphora, left lacrimal gland: Secondary | ICD-10-CM | POA: Diagnosis not present

## 2023-10-10 MED ORDER — PREDNISOLONE ACETATE 1 % OP SUSP
OPHTHALMIC | 0 refills | Status: AC
  Filled 2023-10-10: qty 5, 21d supply, fill #0

## 2023-10-10 MED ORDER — FML FORTE 0.25 % OP SUSP
OPHTHALMIC | 0 refills | Status: AC
  Filled 2023-10-10: qty 10, 21d supply, fill #0

## 2023-10-11 ENCOUNTER — Other Ambulatory Visit (HOSPITAL_BASED_OUTPATIENT_CLINIC_OR_DEPARTMENT_OTHER): Payer: Self-pay

## 2023-10-15 ENCOUNTER — Other Ambulatory Visit (HOSPITAL_BASED_OUTPATIENT_CLINIC_OR_DEPARTMENT_OTHER): Payer: Self-pay

## 2023-10-16 ENCOUNTER — Other Ambulatory Visit (HOSPITAL_BASED_OUTPATIENT_CLINIC_OR_DEPARTMENT_OTHER): Payer: Self-pay

## 2023-10-16 DIAGNOSIS — K080 Exfoliation of teeth due to systemic causes: Secondary | ICD-10-CM | POA: Diagnosis not present

## 2023-10-16 NOTE — Telephone Encounter (Signed)
 [facility] [employer] [facility] [employer]  Patient is ready for scheduling on or after 10/30/23  Out-of-pocket cost due at time of visit: $331.87  Primary: [employer] [place] [employer] HMO-POS Prolia  co-insurance: 20% (approximately $331.87) Admin fee co-insurance: n/a  Deductible: does not apply  Prior Auth: NOT required  Secondary: N/A Prolia  co-insurance:  Admin fee co-insurance:  Deductible:  Prior Auth:  PA# Valid:   ** This summary of benefits is an estimation of the patient's out-of-pocket cost. Exact cost may vary based on individual plan coverage.

## 2023-10-16 NOTE — Telephone Encounter (Signed)
 [employer] - Prior Authorization NOT required for [employer]

## 2023-10-17 ENCOUNTER — Other Ambulatory Visit (HOSPITAL_BASED_OUTPATIENT_CLINIC_OR_DEPARTMENT_OTHER): Payer: Self-pay

## 2023-10-23 DIAGNOSIS — H43393 Other vitreous opacities, bilateral: Secondary | ICD-10-CM | POA: Diagnosis not present

## 2023-10-23 DIAGNOSIS — H18453 Nodular corneal degeneration, bilateral: Secondary | ICD-10-CM | POA: Diagnosis not present

## 2023-10-25 ENCOUNTER — Ambulatory Visit: Payer: Medicare HMO | Admitting: Internal Medicine

## 2023-10-25 MED FILL — Pravastatin Sodium Tab 20 MG: ORAL | 90 days supply | Qty: 90 | Fill #0 | Status: AC

## 2023-10-26 ENCOUNTER — Other Ambulatory Visit (HOSPITAL_BASED_OUTPATIENT_CLINIC_OR_DEPARTMENT_OTHER): Payer: Self-pay

## 2023-10-26 ENCOUNTER — Other Ambulatory Visit: Payer: Self-pay

## 2023-10-26 ENCOUNTER — Other Ambulatory Visit: Payer: Self-pay | Admitting: Internal Medicine

## 2023-10-26 MED ORDER — LEVOTHYROXINE SODIUM 88 MCG PO TABS
88.0000 ug | ORAL_TABLET | Freq: Every morning | ORAL | 2 refills | Status: DC
  Filled 2023-10-26: qty 30, 30d supply, fill #0

## 2023-10-30 ENCOUNTER — Other Ambulatory Visit (HOSPITAL_BASED_OUTPATIENT_CLINIC_OR_DEPARTMENT_OTHER): Payer: Self-pay

## 2023-10-30 ENCOUNTER — Telehealth: Payer: Self-pay | Admitting: Internal Medicine

## 2023-10-30 ENCOUNTER — Other Ambulatory Visit: Payer: Self-pay

## 2023-10-30 DIAGNOSIS — H43393 Other vitreous opacities, bilateral: Secondary | ICD-10-CM | POA: Diagnosis not present

## 2023-10-30 MED ORDER — HYDROCODONE-ACETAMINOPHEN 7.5-325 MG PO TABS
1.0000 | ORAL_TABLET | Freq: Three times a day (TID) | ORAL | 0 refills | Status: DC | PRN
  Filled 2023-10-30 – 2023-10-31 (×2): qty 90, 30d supply, fill #0

## 2023-10-30 NOTE — Telephone Encounter (Signed)
 Requesting: hydrocodone  7.5-325mg   Contract: 11/23/22 UDS: 05/21/23 Last Visit: 05/21/23 Next Visit: 12/04/23 Last Refill: 10/03/23 #90 and 0RF  Several days early   Please Advise

## 2023-10-30 NOTE — Telephone Encounter (Signed)
 PDMP: A couple of days early, prescription sent

## 2023-10-31 ENCOUNTER — Other Ambulatory Visit (HOSPITAL_BASED_OUTPATIENT_CLINIC_OR_DEPARTMENT_OTHER): Payer: Self-pay

## 2023-10-31 ENCOUNTER — Other Ambulatory Visit: Payer: Self-pay

## 2023-11-07 ENCOUNTER — Ambulatory Visit: Payer: Medicare HMO | Admitting: Internal Medicine

## 2023-11-07 ENCOUNTER — Encounter: Payer: Self-pay | Admitting: Internal Medicine

## 2023-11-07 ENCOUNTER — Other Ambulatory Visit (HOSPITAL_BASED_OUTPATIENT_CLINIC_OR_DEPARTMENT_OTHER): Payer: Self-pay

## 2023-11-07 VITALS — BP 120/72 | HR 67 | Ht 65.0 in | Wt 144.8 lb

## 2023-11-07 DIAGNOSIS — E039 Hypothyroidism, unspecified: Secondary | ICD-10-CM

## 2023-11-07 DIAGNOSIS — M810 Age-related osteoporosis without current pathological fracture: Secondary | ICD-10-CM

## 2023-11-07 LAB — VITAMIN D 25 HYDROXY (VIT D DEFICIENCY, FRACTURES): Vit D, 25-Hydroxy: 75 ng/mL (ref 30–100)

## 2023-11-07 MED ORDER — DENOSUMAB 60 MG/ML ~~LOC~~ SOSY: 60.0000 mg | PREFILLED_SYRINGE | Freq: Once | SUBCUTANEOUS | Status: AC

## 2023-11-07 MED ORDER — LEVOTHYROXINE SODIUM 88 MCG PO TABS
88.0000 ug | ORAL_TABLET | Freq: Every morning | ORAL | 3 refills | Status: AC
  Filled 2023-11-07 – 2023-11-28 (×2): qty 90, 90d supply, fill #0
  Filled 2024-02-24: qty 90, 90d supply, fill #1

## 2023-11-07 NOTE — Patient Instructions (Signed)
Please stop at the lab.  Please continue Prolia, vitamin D and calcium.  Continue levothyroxine 88 mcg daily.  Take the thyroid hormone every day, with water, at least 30 minutes before breakfast, separated by at least 4 hours from: - acid reflux medications - calcium - iron - multivitamins  Please come back for a follow-up appointment in 1 year.
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
## 2023-11-07 NOTE — Addendum Note (Signed)
 Addended by: CLEOTILDE ROLIN RAMAN on: 11/07/2023 11:38 AM   Modules accepted: Orders

## 2023-11-08 ENCOUNTER — Ambulatory Visit: Payer: Self-pay | Admitting: Internal Medicine

## 2023-11-10 NOTE — Telephone Encounter (Signed)
 Last Prolia  inj 11/07/23 Next Prolia  inj due 05/10/24

## 2023-11-27 DIAGNOSIS — K080 Exfoliation of teeth due to systemic causes: Secondary | ICD-10-CM | POA: Diagnosis not present

## 2023-11-28 ENCOUNTER — Encounter: Payer: Self-pay | Admitting: Bariatrics

## 2023-11-28 ENCOUNTER — Other Ambulatory Visit (HOSPITAL_BASED_OUTPATIENT_CLINIC_OR_DEPARTMENT_OTHER): Payer: Self-pay

## 2023-11-28 ENCOUNTER — Other Ambulatory Visit: Payer: Self-pay

## 2023-11-28 ENCOUNTER — Ambulatory Visit (INDEPENDENT_AMBULATORY_CARE_PROVIDER_SITE_OTHER): Admitting: Bariatrics

## 2023-11-28 ENCOUNTER — Telehealth: Payer: Self-pay | Admitting: Internal Medicine

## 2023-11-28 VITALS — BP 127/76 | HR 63 | Temp 97.7°F | Ht 65.0 in | Wt 140.0 lb

## 2023-11-28 DIAGNOSIS — Z6823 Body mass index (BMI) 23.0-23.9, adult: Secondary | ICD-10-CM | POA: Diagnosis not present

## 2023-11-28 DIAGNOSIS — E669 Obesity, unspecified: Secondary | ICD-10-CM | POA: Diagnosis not present

## 2023-11-28 DIAGNOSIS — R7303 Prediabetes: Secondary | ICD-10-CM

## 2023-11-28 MED ORDER — HYDROCODONE-ACETAMINOPHEN 7.5-325 MG PO TABS
1.0000 | ORAL_TABLET | Freq: Three times a day (TID) | ORAL | 0 refills | Status: DC | PRN
  Filled 2023-11-28: qty 90, 30d supply, fill #0

## 2023-11-28 NOTE — Progress Notes (Signed)
 WEIGHT SUMMARY AND BIOMETRICS  Weight Lost Since Last Visit: 0  Weight Gained Since Last Visit: 2lb   Vitals Temp: 97.7 F (36.5 C) BP: 127/76 Pulse Rate: 63 SpO2: 98 %   Anthropometric Measurements Height: 5' 5 (1.651 m) Weight: 140 lb (63.5 kg) BMI (Calculated): 23.3 Weight at Last Visit: 138lb Weight Lost Since Last Visit: 0 Weight Gained Since Last Visit: 2lb Starting Weight: 184lb Total Weight Loss (lbs): 44 lb (20 kg)   Body Composition  Body Fat %: 36.2 % Fat Mass (lbs): 50.6 lbs Muscle Mass (lbs): 84.8 lbs Total Body Water (lbs): 61.6 lbs Visceral Fat Rating : 10   Other Clinical Data Fasting: no Labs: no Today's Visit #: 17 Starting Date: 11/23/21    OBESITY Landyn is here to discuss her progress with her obesity treatment plan along with follow-up of her obesity related diagnoses.    Nutrition Plan: the pescatarian plan - 40% adherence.  Current exercise: walking  Interim History:  She is up 2 lbs since her last visit.  Eating all of the food on the plan. and Water intake is adequate. Some craving in the evenings.   Hunger is moderately controlled.  Cravings are moderately controlled.  Assessment/Plan:   Prediabetes Last A1c was 5.5  Medication(s):  Lab Results  Component Value Date   HGBA1C 5.5 11/15/2022   HGBA1C 5.7 (H) 11/23/2021   HGBA1C 5.8 03/24/2021   HGBA1C 5.5 05/22/2015   Lab Results  Component Value Date   INSULIN  8.7 11/23/2021    Plan: Will minimize all refined carbohydrates both sweets and starches.  Will work on the plan and exercise.  Consider both aerobic and resistance training.  Will keep protein, water, and fiber intake high.  Increase Polyunsaturated and Monounsaturated fats to increase satiety and encourage weight loss.  Will work on her sleep hygiene. Will plan on doing some PT for her hip.   Will continue to read labels.  Will eat out less.    Generalized Obesity: Current BMI BMI (Calculated): 23.3   Pharmacotherapy Plan No anti-obesity medications.   Lynix is currently in the action stage of change. As such, her goal is to maintain weight for now.  She has agreed to the pescatarian plan.  Exercise goals: Older adults should follow the adult guidelines. When older adults cannot meet the adult guidelines, they should be as physically active as their abilities and conditions will allow.   Behavioral modification strategies: increasing lean protein intake, decreasing simple carbohydrates , no meal skipping, decrease eating out, meal planning , increase water intake, better snacking choices, planning for success, avoiding temptations, and mindful eating.  Jalexus has agreed to follow-up with our clinic in 3 months.    Objective:   VITALS: Per patient if applicable, see vitals. GENERAL: Alert and in no acute distress. CARDIOPULMONARY: No increased WOB. Speaking in clear sentences.  PSYCH: Pleasant and cooperative. Speech normal rate and rhythm. Affect is appropriate. Insight and judgement are appropriate. Attention is focused, linear, and appropriate.  NEURO: Oriented as arrived to appointment on time with no prompting.   Attestation Statements:   This was prepared with the assistance of Engineer, civil (consulting).  Occasional wrong-word or sound-a-like substitutions may have occurred due to the inherent limitations of voice recognition   Clayborne Daring, DO

## 2023-11-28 NOTE — Telephone Encounter (Signed)
 Requesting: hydrocodone  7.5-325mg   Contract:11/23/22 UDS: 05/21/23 Last Visit: 05/21/23 Next Visit: 12/04/23 Last Refill: 10/30/23 #90 and 0RF   Please Advise

## 2023-11-28 NOTE — Telephone Encounter (Signed)
 PDMP okay, Rx sent

## 2023-11-29 ENCOUNTER — Ambulatory Visit: Payer: Medicare HMO | Admitting: Obstetrics and Gynecology

## 2023-11-29 ENCOUNTER — Encounter: Payer: Self-pay | Admitting: Obstetrics and Gynecology

## 2023-11-29 ENCOUNTER — Other Ambulatory Visit: Payer: Self-pay

## 2023-11-29 ENCOUNTER — Other Ambulatory Visit (HOSPITAL_BASED_OUTPATIENT_CLINIC_OR_DEPARTMENT_OTHER): Payer: Self-pay

## 2023-11-29 VITALS — BP 114/76 | HR 66 | Ht 65.0 in | Wt 145.0 lb

## 2023-11-29 DIAGNOSIS — N952 Postmenopausal atrophic vaginitis: Secondary | ICD-10-CM

## 2023-11-29 DIAGNOSIS — K5909 Other constipation: Secondary | ICD-10-CM

## 2023-11-29 DIAGNOSIS — Z01419 Encounter for gynecological examination (general) (routine) without abnormal findings: Secondary | ICD-10-CM

## 2023-11-29 DIAGNOSIS — Z9189 Other specified personal risk factors, not elsewhere classified: Secondary | ICD-10-CM | POA: Diagnosis not present

## 2023-11-29 DIAGNOSIS — Z5181 Encounter for therapeutic drug level monitoring: Secondary | ICD-10-CM

## 2023-11-29 MED ORDER — ESTRADIOL 10 MCG VA TABS
10.0000 ug | ORAL_TABLET | VAGINAL | 11 refills | Status: AC
  Filled 2023-11-29 – 2024-01-25 (×3): qty 8, 28d supply, fill #0
  Filled 2024-02-20: qty 8, 28d supply, fill #1
  Filled 2024-03-19: qty 8, 28d supply, fill #2
  Filled 2024-04-21: qty 8, 28d supply, fill #3

## 2023-11-29 NOTE — Progress Notes (Signed)
 78 y.o. G41P0000 Married Caucasian female here for a breast and pelvic exam.    The patient is also followed for vaginal atrophy and use of Vagifem . Not sexually active.  No vaginal bleeding or spotting.   Has difficultly to have bowel movements.   She has chronic constipation lifelong.  Presses abdominally to help with her bowel movements.   Taking Linzess  twice a week due to constipation.    PCP: Amon Aloysius BRAVO, MD   Patient's last menstrual period was 04/10/1998 (approximate).           Sexually active: No.  The current method of family planning is post menopausal status.    Menopausal hormone therapy:  Yuvafem  Exercising: Yes.    Walking Smoker:  Former   OB History     Gravida  0   Para  0   Term  0   Preterm  0   AB  0   Living  0      SAB  0   IAB  0   Ectopic  0   Multiple  0   Live Births              HEALTH MAINTENANCE: Last 2 paps: 07/27/21 neg HR HPV neg, 01/15/19 neg  History of abnormal Pap or positive HPV:  no Mammogram:  04/09/23 Breast Density Cat A, BIRADS Cat 1 neg  Colonoscopy:  09/03/20 Bone Density:  04/09/23  Result: osteoporosis left forearm, osteopenia of hip.  On Prolia .  Immunization History  Administered Date(s) Administered   Fluad  Quad(high Dose 65+) 12/13/2018, 01/30/2022   Fluad  Trivalent(High Dose 65+) 02/01/2023   H1N1 04/09/2008   Influenza Split 02/28/2012   Influenza Whole 03/04/2007, 01/29/2009, 02/07/2010   Influenza, High Dose Seasonal PF 01/07/2015, 01/04/2016, 01/10/2017, 01/19/2020, 12/26/2020   Influenza,inj,Quad PF,6+ Mos 01/07/2013, 02/10/2014   Influenza,inj,quad, With Preservative 01/14/2018   PFIZER(Purple Top)SARS-COV-2 Vaccination 05/24/2019, 06/15/2019, 01/24/2020   PNEUMOCOCCAL CONJUGATE-20 11/10/2021   Pfizer Covid-19 Vaccine Bivalent Booster 59yrs & up 01/19/2021   Pfizer(Comirnaty )Fall Seasonal Vaccine 12 years and older 03/14/2022   Pneumococcal Conjugate-13 08/08/2013   Pneumococcal  Polysaccharide-23 05/11/2004, 06/12/2011   Td 02/08/2006, 09/08/2015   Zoster Recombinant(Shingrix ) 11/21/2018, 03/13/2019   Zoster, Live 06/16/2010      reports that she quit smoking about 19 years ago. Her smoking use included cigarettes. She started smoking about 58 years ago. She has a 38.5 pack-year smoking history. She has never used smokeless tobacco. She reports that she does not drink alcohol and does not use drugs.  Past Medical History:  Diagnosis Date   Allergy see chart   Anxiety 06/2020   back pain   Arthritis    Back pain    Back pain    Cataract    both eyes rt. 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   Fibromyalgia    see rheumatology   GERD (gastroesophageal reflux disease)    PAST HX    High blood pressure    High cholesterol    Hyperlipidemia    Hypothyroidism    Joint pain    Migraine    Ocular rosacea 03/2009   on doxy   Osteoporosis    osteopenia   Pleuritic chest pain  etiology unclear, has beeb eval by pulmonary and rheumatology (autoimmune dz?)   Swelling of lower extremity    Wrist fracture 2018    Past Surgical History:  Procedure Laterality Date   ANTERIOR LAT LUMBAR FUSION N/A 12/03/2019   Procedure: ANTERIOR LATERAL LUMBAR FUSION L2-4;  Surgeon: Burnetta Aures, MD;  Location: [facility] OR;  Service: Orthopedics;  Laterality: N/A;  4 hrs   COLONOSCOPY     COSMETIC SURGERY  04/12/2018   L upper eyelid ptosis repair, and trichophytic brow lift   ESOPHAGOGASTRODUODENOSCOPY (EGD) WITH PROPOFOL  N/A 03/27/2013   Procedure: ESOPHAGOGASTRODUODENOSCOPY (EGD) WITH PROPOFOL ;  Surgeon: Toribio SHAUNNA Cedar, MD;  Location: [facility];  Service: Endoscopy;  Laterality: N/A;  possible dil   EYE SURGERY     growth on cornea-bil.   FRACTURE SURGERY   hand 2017   plastic surgery face  2013   POLYPECTOMY     right rotator cuff repair  2010   SPINE SURGERY     TONSILLECTOMY     UPPER GASTROINTESTINAL ENDOSCOPY      Current Outpatient Medications  Medication Sig Dispense Refill   albuterol  (VENTOLIN  HFA) 108 (90 Base) MCG/ACT inhaler Inhale 2 puffs into the lungs every 6 (six) hours as needed for wheezing or shortness of breath. 6.7 g 2   aspirin  81 MG EC tablet Take 81 mg by mouth daily. Swallow whole.     calcium  carbonate (OS-CAL) 1250 (500 CA) MG chewable tablet Chew 1 tablet by mouth 2 (two) times daily.     Cholecalciferol  (VITAMIN D3) 50 MCG (2000 UT) CHEW Chew 2,000 Units by mouth daily.     cycloSPORINE  (RESTASIS ) 0.05 % ophthalmic emulsion 1 drop 2 (two) times daily.     denosumab  (PROLIA ) 60 MG/ML SOSY injection Inject 60 mg into the skin every 6 (six) months. 1 mL 0   Estradiol  (YUVAFEM ) 10 MCG TABS vaginal tablet PLACE 1 TABLET VAGINALLY 2 TIMES A WEEK. 24 tablet 3   fluticasone -salmeterol (WIXELA INHUB ) 100-50 MCG/ACT AEPB Inhale 1 puff into the lungs 2 (two) times daily. 60 each 5   hydrochlorothiazide  (MICROZIDE ) 12.5 MG capsule Take 1 capsule (12.5 mg total) by mouth daily. 90 capsule 1   HYDROcodone -acetaminophen  (NORCO) 7.5-325 MG tablet Take 1 tablet by mouth 3 (three) times daily as needed for moderate pain (pain score 4-6). 90 tablet 0   hydroxypropyl methylcellulose / hypromellose (ISOPTO TEARS / GONIOVISC) 2.5 % ophthalmic solution Place 1 drop into both eyes 3 (three) times daily as needed for dry eyes.     levothyroxine  (SYNTHROID ) 88 MCG tablet Take 1 tablet (88 mcg total) by mouth every morning before breakfast. 90 tablet 3   linaclotide  (LINZESS ) 145 MCG CAPS capsule Take 1 capsule (145 mcg total) by mouth daily before breakfast. 30 capsule 2   Multiple Vitamin (MULTIVITAMIN) capsule Take 1 capsule by mouth daily.     naloxone  (NARCAN ) 4 MG/0.1ML LIQD nasal spray kit Use as directed 2 each 0   omeprazole   (PRILOSEC ) 40 MG capsule Take 1 capsule (40 mg total) by mouth daily 30 mins. before breakfast. 90 capsule 2   potassium chloride  (KLOR-CON  M) 10 MEQ tablet Take 1 tablet (10 mEq total) by mouth daily. 10 tablet 0   pravastatin  (PRAVACHOL ) 20 MG tablet Take 1 tablet (20 mg total) by mouth at bedtime. 90 tablet 1   vitamin E 1000 UNIT capsule      zolpidem  (AMBIEN ) 10 MG tablet Take 0.5-1 tablets (5-10 mg total) by mouth at bedtime  as needed for sleep. 30 tablet 3   doxycycline  (VIBRA -TABS) 100 MG tablet Take 1 tablet (100 mg total) by mouth 2 (two) times daily. (Patient not taking: Reported on 11/29/2023) 14 tablet 0   Current Facility-Administered Medications  Medication Dose Route Frequency Provider Last Rate Last Admin   [START ON 05/09/2024] denosumab  (PROLIA ) injection 60 mg  60 mg Subcutaneous Once Gherghe, Cristina, MD        ALLERGIES: Alendronate sodium, Bactrim  [sulfamethoxazole -trimethoprim ], Contrast media [iodinated contrast media], Iodine, Milnacipran, Penicillins, and Prednisone   Family History  Adopted: Yes  Problem Relation Age of Onset   Cancer Neg Hx    Depression Neg Hx    Diabetes Neg Hx    Stroke Neg Hx    Hypertension Neg Hx    Heart disease Neg Hx     Review of Systems  All other systems reviewed and are negative.   PHYSICAL EXAM:  BP 114/76 (BP Location: Left Arm, Patient Position: Sitting)   Pulse 66   Ht 5' 5 (1.651 m)   Wt 145 lb (65.8 kg)   LMP 04/10/1998 (Approximate)   SpO2 100%   BMI 24.13 kg/m     General appearance: alert, cooperative and appears stated age Head: normocephalic, without obvious abnormality, atraumatic Neck: no adenopathy, supple, symmetrical, trachea midline and thyroid  normal to inspection and palpation Lungs: clear to auscultation bilaterally Breasts: normal appearance, no masses or tenderness, No nipple retraction or dimpling, No nipple discharge or bleeding, No axillary adenopathy Heart: regular rate and rhythm Abdomen:  soft, non-tender; no masses, no organomegaly Extremities: extremities normal, atraumatic, no cyanosis or edema Skin: skin color, texture, turgor normal. No rashes or lesions Lymph nodes: cervical, supraclavicular, and axillary nodes normal. Neurologic: grossly normal  Pelvic: External genitalia:  no lesions              No abnormal inguinal nodes palpated.              Urethra:  normal appearing urethra with no masses, tenderness or lesions              Bartholins and Skenes: normal                 Vagina: normal appearing vagina with normal color and discharge, no lesions              Cervix:  no lesions              Pap taken: no Bimanual Exam:  Uterus:  normal size, contour, position, consistency, mobility, non-tender              Adnexa: no mass, fullness, tenderness              Rectal exam: yes.  Confirms.              Anus:  normal sphincter tone, no lesions  Chaperone was present for exam:  Kari HERO, CMA  ASSESSMENT: Encounter for breast and pelvic exam.  Other specified personal risk factors.  Vaginal atrophy. Medication monitoring encounter.  Osteoporosis.  On Prolia .  Followed by endocrinology. Chronic constipation.   PLAN: Mammogram screening discussed. Self breast awareness reviewed. Pap and HRV collected:  no, due in 2028.  Guidelines for Calcium , Vitamin D , regular exercise program including cardiovascular and weight bearing exercise. Medication refills:  Vagifem .  I discussed potential effect on breast cancer. Declines pelvic floor therapy currently.  Follow up:  1 year and prn.     Additional counseling given.  yes. 20 min  total time was spent for this patient encounter, including preparation, face-to-face counseling with the patient, coordination of care, and documentation of the encounter in addition to doing the breast and pelvic exam.

## 2023-11-29 NOTE — Patient Instructions (Signed)

## 2023-11-30 ENCOUNTER — Other Ambulatory Visit (HOSPITAL_BASED_OUTPATIENT_CLINIC_OR_DEPARTMENT_OTHER): Payer: Self-pay

## 2023-12-04 ENCOUNTER — Other Ambulatory Visit (HOSPITAL_BASED_OUTPATIENT_CLINIC_OR_DEPARTMENT_OTHER): Payer: Self-pay

## 2023-12-04 ENCOUNTER — Ambulatory Visit (INDEPENDENT_AMBULATORY_CARE_PROVIDER_SITE_OTHER): Payer: Medicare Other | Admitting: Internal Medicine

## 2023-12-04 ENCOUNTER — Encounter: Payer: Self-pay | Admitting: Internal Medicine

## 2023-12-04 ENCOUNTER — Other Ambulatory Visit: Payer: Self-pay

## 2023-12-04 VITALS — BP 116/84 | HR 63 | Temp 97.6°F | Resp 16 | Ht 65.0 in | Wt 145.0 lb

## 2023-12-04 DIAGNOSIS — G8929 Other chronic pain: Secondary | ICD-10-CM

## 2023-12-04 DIAGNOSIS — Z0001 Encounter for general adult medical examination with abnormal findings: Secondary | ICD-10-CM

## 2023-12-04 DIAGNOSIS — R739 Hyperglycemia, unspecified: Secondary | ICD-10-CM

## 2023-12-04 DIAGNOSIS — E7800 Pure hypercholesterolemia, unspecified: Secondary | ICD-10-CM

## 2023-12-04 DIAGNOSIS — I2510 Atherosclerotic heart disease of native coronary artery without angina pectoris: Secondary | ICD-10-CM | POA: Diagnosis not present

## 2023-12-04 DIAGNOSIS — E038 Other specified hypothyroidism: Secondary | ICD-10-CM | POA: Diagnosis not present

## 2023-12-04 DIAGNOSIS — R600 Localized edema: Secondary | ICD-10-CM | POA: Diagnosis not present

## 2023-12-04 DIAGNOSIS — I2583 Coronary atherosclerosis due to lipid rich plaque: Secondary | ICD-10-CM

## 2023-12-04 DIAGNOSIS — M549 Dorsalgia, unspecified: Secondary | ICD-10-CM

## 2023-12-04 DIAGNOSIS — J438 Other emphysema: Secondary | ICD-10-CM | POA: Diagnosis not present

## 2023-12-04 DIAGNOSIS — G4700 Insomnia, unspecified: Secondary | ICD-10-CM

## 2023-12-04 LAB — BASIC METABOLIC PANEL WITH GFR
BUN: 13 mg/dL (ref 6–23)
CO2: 34 meq/L — ABNORMAL HIGH (ref 19–32)
Calcium: 9.3 mg/dL (ref 8.4–10.5)
Chloride: 100 meq/L (ref 96–112)
Creatinine, Ser: 0.75 mg/dL (ref 0.40–1.20)
GFR: 76.61 mL/min (ref >60.00)
Glucose, Bld: 83 mg/dL (ref 70–99)
Potassium: 3.9 meq/L (ref 3.5–5.1)
Sodium: 144 meq/L (ref 135–145)

## 2023-12-04 LAB — CBC WITH DIFFERENTIAL/PLATELET
Basophils Absolute: 0 K/uL (ref 0.0–0.1)
Basophils Relative: 0.6 % (ref 0.0–3.0)
Eosinophils Absolute: 0.1 K/uL (ref 0.0–0.7)
Eosinophils Relative: 1.6 % (ref 0.0–5.0)
HCT: 41.8 % (ref 36.0–46.0)
Hemoglobin: 13.8 g/dL (ref 12.0–15.0)
Lymphocytes Relative: 24 % (ref 12.0–46.0)
Lymphs Abs: 1.3 K/uL (ref 0.7–4.0)
MCHC: 32.9 g/dL (ref 30.0–36.0)
MCV: 93.7 fl (ref 78.0–100.0)
Monocytes Absolute: 0.6 K/uL (ref 0.1–1.0)
Monocytes Relative: 11 % (ref 3.0–12.0)
Neutro Abs: 3.4 K/uL (ref 1.4–7.7)
Neutrophils Relative %: 62.8 % (ref 43.0–77.0)
Platelets: 231 K/uL (ref 150.0–400.0)
RBC: 4.46 Mil/uL (ref 3.87–5.11)
RDW: 13.4 % (ref 11.5–15.5)
WBC: 5.5 K/uL (ref 4.0–10.5)

## 2023-12-04 LAB — HEMOGLOBIN A1C: Hgb A1c MFr Bld: 5.6 % (ref 4.6–6.5)

## 2023-12-04 LAB — TSH: TSH: 0.68 u[IU]/mL (ref 0.35–5.50)

## 2023-12-04 MED ORDER — FLUOROMETHOLONE 0.1 % OP SUSP
OPHTHALMIC | 1 refills | Status: AC
  Filled 2023-12-04: qty 5, 19d supply, fill #0

## 2023-12-04 NOTE — Patient Instructions (Signed)
 Vaccines I recommend RSV Flu shot COVID booster   Check the  blood pressure regularly Blood pressure goal:  between 110/65 and  135/85. If it is consistently higher or lower, let me know     GO TO THE LAB :  Get the blood work   Your results will be posted on MyChart with my comments  Go to the front desk for the checkout Please make an appointment for a checkup in 4 months

## 2023-12-04 NOTE — Progress Notes (Unsigned)
 Subjective:    Patient ID: [name], female    DOB: 09-01-1945, 78 y.o.   MRN: [id number]  DOS:  12/04/2023 Type of visit - description: CPX  Here for CPX Chronic medical problems addressed. In general feeling well although feels tired frequently.  Denies chest pain or difficulty breathing.  No palpitations No lower extremity edema No cough or wheezing. Denies nausea vomiting.  No blood in the stools.   Review of Systems  Other than above, a 14 point review of systems is negative      Past Medical History:  Diagnosis Date   Allergy see chart   Anxiety 06/2020   back pain   Arthritis    Back pain    Back pain    Cataract    both eyes rt. 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   Fibromyalgia    see rheumatology   GERD (gastroesophageal reflux disease)    PAST HX    High blood pressure    High cholesterol    Hyperlipidemia    Hypothyroidism    Joint pain    Migraine    Ocular rosacea 03/2009   on doxy   Osteoporosis    osteopenia   Pleuritic chest pain    etiology unclear, has beeb eval by pulmonary and rheumatology (autoimmune dz?)   Swelling of lower extremity    Wrist fracture 2018    Past Surgical History:  Procedure Laterality Date   ANTERIOR LAT LUMBAR FUSION N/A 12/03/2019   Procedure: ANTERIOR LATERAL LUMBAR FUSION L2-4;  Surgeon: Burnetta Aures, MD;  Location: [facility] OR;  Service: Orthopedics;  Laterality: N/A;  4 hrs   COLONOSCOPY     COSMETIC SURGERY  04/12/2018   L upper eyelid ptosis repair, and trichophytic brow lift   ESOPHAGOGASTRODUODENOSCOPY (EGD) WITH PROPOFOL  N/A 03/27/2013   Procedure: ESOPHAGOGASTRODUODENOSCOPY (EGD) WITH PROPOFOL ;  Surgeon: Toribio SHAUNNA Cedar, MD;  Location: [facility];  Service: Endoscopy;  Laterality:  N/A;  possible dil   EYE SURGERY     growth on cornea-bil.   FRACTURE SURGERY  hand 2017   plastic surgery face  2013   POLYPECTOMY     right rotator cuff repair  2010   SPINE SURGERY     TONSILLECTOMY     UPPER GASTROINTESTINAL ENDOSCOPY      Current Outpatient Medications  Medication Instructions   albuterol  (VENTOLIN  HFA) 108 (90 Base) MCG/ACT inhaler 2 puffs, Inhalation, Every 6 hours PRN   aspirin  EC 81 mg, Daily   calcium  carbonate (OS-CAL) 1250 (500 CA) MG chewable tablet 1 tablet, 2 times daily   cycloSPORINE  (RESTASIS ) 0.05 % ophthalmic emulsion 1 drop, 2 times daily   denosumab  (PROLIA ) 60 mg, Subcutaneous, Every 6 months   Estradiol  (YUVAFEM ) 10 mcg, Vaginal, 2 times weekly   fluorometholone  (FML) 0.1 % ophthalmic suspension Place 1 drop into both eyes 4 (four) times daily for 7 days, THEN 1 drop 3 (three) times daily for 7 days, THEN 1 drop 2 (two) times daily for 7 days, THEN 1 drop daily for 7 days.   fluticasone -salmeterol (WIXELA INHUB ) 100-50 MCG/ACT AEPB 1 puff, Inhalation, 2 times daily   hydrochlorothiazide  (MICROZIDE ) 12.5 mg, Oral, Daily   HYDROcodone -acetaminophen  (NORCO)  7.5-325 MG tablet 1 tablet, Oral, 3 times daily PRN   hydroxypropyl methylcellulose / hypromellose (ISOPTO TEARS / GONIOVISC) 2.5 % ophthalmic solution 1 drop, 3 times daily PRN   levothyroxine  (SYNTHROID ) 88 MCG tablet Take 1 tablet (88 mcg total) by mouth every morning before breakfast.   Linzess  145 mcg, Oral, Daily before breakfast   Multiple Vitamin (MULTIVITAMIN) capsule 1 capsule, Daily   naloxone  (NARCAN ) 4 MG/0.1ML LIQD nasal spray kit Use as directed   omeprazole  (PRILOSEC ) 40 MG capsule Take 1 capsule (40 mg total) by mouth daily 30 mins. before breakfast.   potassium chloride  (KLOR-CON  M) 10 MEQ tablet 10 mEq, Oral, Daily   pravastatin  (PRAVACHOL ) 20 mg, Oral, Daily at bedtime   Vitamin D3 2,000 Units, Daily   vitamin E 1000 UNIT capsule    zolpidem  (AMBIEN ) 5-10 mg, Oral, At  bedtime PRN       Objective:   Physical Exam BP 116/84   Pulse 63   Temp 97.6 F (36.4 C) (Oral)   Resp 16   Ht 5' 5 (1.651 m)   Wt 145 lb (65.8 kg)   LMP 04/10/1998 (Approximate)   SpO2 95%   BMI 24.13 kg/m  General: Well developed, NAD, BMI noted Neck: No  thyromegaly  HEENT:  Normocephalic . Face symmetric, atraumatic Lungs:  CTA B Normal respiratory effort, no intercostal retractions, no accessory muscle use. Heart: RRR,  no murmur.  Abdomen:  Not distended, soft, non-tender. No rebound or rigidity.   Lower extremities: no pretibial edema bilaterally  Skin: Exposed areas without rash. Not pale. Not jaundice Neurologic:  alert & oriented X3.  Speech normal, gait appropriate for age and unassisted Strength symmetric and appropriate for age.  Psych: Cognition and judgment appear intact.  Cooperative with normal attention span and concentration.  Behavior appropriate. No anxious or depressed appearing.     Assessment   Problem list Prediabetes  Hypothyroidism Hyperlipidemia: Started meds 09/2019 obesity Lower extremity edema: On HCTZ GERD Migraines insmonia-- xanax  prn COPD: per chest CTs, centrilobular former heavy smoker, quit 2005 [facility]- Pain mngmt  -- back pain, 2017 had a MRI, saw Dr Anderson, Rx local injection, PT --h/o fibromyalgia --H/o Pleuritic chest pain, on-off, resolved  --spinal stenosis (MRI 05/2018)  Osteopenia:  Per ENDO --T score 2011   -2.6 (intolerant to fosamax, ok w/ Boniva x years) --T score 01-2013   -2.6 --T score 02-2015  -2.3, rx ca and cit D  Glaucoma Ocular Rosacea 2010 CV: LAD  artery disease  per CT scan of the chest, CP:  admitted, stress lest low risk 05-2015.  Control CV RF CAD, aortic sclerosis: Per CT Chronic hoarseness: ENT OV 04-2022, RTC prn  PLAN: Here for CPX - Td 08-2015 - Pneumonia shot 2006 and 2013;  prevnar 2015; PNM 20: 2023 - s/p zostavax, s/p  shingrex -Vaccines I recommend: RSV, COVID booster, flu  shot. --Cscope 11-2005:  polyps-adenomatous, repeated  colonoscopy 11/2008 , 04-2012, 04/2017, + polyps, cscope 08/2020, next 5 years per GI letter. -- Female care per gyn, [facility] 03-2023, Pap  07-2021 ([facility]) --Palpable aorta-- us  neg for AAA 07-2012 -- Diet: Doing well, stable, apparently weight management clinic --Labs: BMP CBC A1c TSH -Lung cancer screening,  does not  qualify  -- advance directives: filed  Other issues addressed Prediabetes: Check A1c. Hypothyroidism: on synthroid , check TSH. Hyperlipidemia: Last LDL 62, continue present care Lower extremity edema, on HCTZ.  Controlled.  Check labs Insomnia: Still having issues, on zolpidem  nightly, occasionally  takes half Xanax  (has a leftover).Sleep is disturbed by pain. COPD: Dx by CT, centrilobular.  On Wixela. Pain management: Mostly back pain, and hydrocodone .  Offered a referral to pain management if she feels it is better controlled, declined for now.  Parking permit provided CAD, aortic sclerosis: Asymptomatic.  Mild fatigue, checking labs.  Likely multifactorial. RTC 4 months.

## 2023-12-05 ENCOUNTER — Ambulatory Visit: Payer: Self-pay | Admitting: Internal Medicine

## 2023-12-05 ENCOUNTER — Encounter: Payer: Self-pay | Admitting: Internal Medicine

## 2023-12-05 NOTE — Assessment & Plan Note (Signed)
 Here for CPX - Td 08-2015 - Pneumonia shot 2006 and 2013;  prevnar 2015; PNM 20: 2023 - s/p zostavax, s/p  shingrex -Vaccines I recommend: RSV, COVID booster, flu shot. --Cscope 11-2005:  polyps-adenomatous, repeated  colonoscopy 11/2008 , 04-2012, 04/2017, + polyps, cscope 08/2020, next 5 years per GI letter. -- Female care per gyn, [facility] 03-2023, Pap  07-2021 ([facility]) --Palpable aorta-- us  neg for AAA 07-2012 -- Diet: Doing well, stable, apparently weight management clinic --Labs: BMP CBC A1c TSH -Lung cancer screening,  does not  qualify  -- advance directives: filed

## 2023-12-05 NOTE — Assessment & Plan Note (Signed)
 Here for CPX Other issues addressed Prediabetes: Check A1c. Hypothyroidism: on synthroid , check TSH. Hyperlipidemia: Last LDL 62, continue present care Lower extremity edema, on HCTZ.  Controlled.  Check labs Insomnia: Still having issues, on zolpidem  nightly, occasionally takes half Xanax  (has a leftover).Sleep is disturbed by pain. COPD: Dx by CT, centrilobular.  On Wixela. Pain management: Mostly back pain, and hydrocodone .  Offered a referral to pain management if she feels it is better controlled, declined for now.  Parking permit provided CAD, aortic sclerosis: Asymptomatic.  Mild fatigue, checking labs.  Likely multifactorial. RTC 4 months.

## 2023-12-26 ENCOUNTER — Telehealth: Payer: Self-pay | Admitting: Internal Medicine

## 2023-12-26 ENCOUNTER — Other Ambulatory Visit (HOSPITAL_BASED_OUTPATIENT_CLINIC_OR_DEPARTMENT_OTHER): Payer: Self-pay

## 2023-12-26 MED ORDER — HYDROCODONE-ACETAMINOPHEN 7.5-325 MG PO TABS
1.0000 | ORAL_TABLET | Freq: Three times a day (TID) | ORAL | 0 refills | Status: DC | PRN
  Filled 2023-12-26: qty 90, 30d supply, fill #0

## 2023-12-26 NOTE — Telephone Encounter (Signed)
 PDMP okay, Rx sent

## 2023-12-26 NOTE — Telephone Encounter (Signed)
 Requesting: hydrocodone  7.5-325mg   Contract:11/23/22 UDS:05/21/23 Last Visit: 12/04/23 Next Visit: 03/28/24 Last Refill: 11/28/23 #90 and 0RF  Please Advise

## 2023-12-27 ENCOUNTER — Other Ambulatory Visit (HOSPITAL_BASED_OUTPATIENT_CLINIC_OR_DEPARTMENT_OTHER): Payer: Self-pay

## 2023-12-28 ENCOUNTER — Other Ambulatory Visit: Payer: Self-pay

## 2023-12-31 ENCOUNTER — Other Ambulatory Visit (HOSPITAL_BASED_OUTPATIENT_CLINIC_OR_DEPARTMENT_OTHER): Payer: Self-pay

## 2023-12-31 ENCOUNTER — Other Ambulatory Visit: Payer: Self-pay

## 2024-01-01 ENCOUNTER — Other Ambulatory Visit (HOSPITAL_BASED_OUTPATIENT_CLINIC_OR_DEPARTMENT_OTHER): Payer: Self-pay

## 2024-01-02 ENCOUNTER — Other Ambulatory Visit (HOSPITAL_BASED_OUTPATIENT_CLINIC_OR_DEPARTMENT_OTHER): Payer: Self-pay

## 2024-01-03 ENCOUNTER — Other Ambulatory Visit (HOSPITAL_BASED_OUTPATIENT_CLINIC_OR_DEPARTMENT_OTHER): Payer: Self-pay

## 2024-01-03 DIAGNOSIS — H16142 Punctate keratitis, left eye: Secondary | ICD-10-CM | POA: Diagnosis not present

## 2024-01-03 DIAGNOSIS — H18453 Nodular corneal degeneration, bilateral: Secondary | ICD-10-CM | POA: Diagnosis not present

## 2024-01-03 MED ORDER — POLYMYXIN B-TRIMETHOPRIM 10000-0.1 UNIT/ML-% OP SOLN
1.0000 [drp] | Freq: Four times a day (QID) | OPHTHALMIC | 0 refills | Status: AC
  Filled 2024-01-03: qty 10, 50d supply, fill #0

## 2024-01-07 ENCOUNTER — Other Ambulatory Visit (HOSPITAL_BASED_OUTPATIENT_CLINIC_OR_DEPARTMENT_OTHER): Payer: Self-pay

## 2024-01-07 MED ORDER — FLUZONE HIGH-DOSE 0.5 ML IM SUSY
0.5000 mL | PREFILLED_SYRINGE | Freq: Once | INTRAMUSCULAR | 0 refills | Status: AC
  Filled 2024-01-07: qty 0.5, 1d supply, fill #0

## 2024-01-07 MED ORDER — RSVPREF3 VAC RECOMB ADJUVANTED 120 MCG/0.5ML IM SUSR
0.5000 mL | Freq: Once | INTRAMUSCULAR | 0 refills | Status: AC
Start: 2024-01-07 — End: 2024-01-08
  Filled 2024-01-07: qty 0.5, 1d supply, fill #0

## 2024-01-23 ENCOUNTER — Other Ambulatory Visit: Payer: Self-pay | Admitting: Internal Medicine

## 2024-01-23 ENCOUNTER — Other Ambulatory Visit: Payer: Self-pay

## 2024-01-23 ENCOUNTER — Other Ambulatory Visit (HOSPITAL_BASED_OUTPATIENT_CLINIC_OR_DEPARTMENT_OTHER): Payer: Self-pay

## 2024-01-23 ENCOUNTER — Telehealth: Payer: Self-pay | Admitting: Internal Medicine

## 2024-01-23 MED ORDER — HYDROCHLOROTHIAZIDE 12.5 MG PO CAPS
12.5000 mg | ORAL_CAPSULE | Freq: Every day | ORAL | 1 refills | Status: AC
  Filled 2024-01-23: qty 90, 90d supply, fill #0
  Filled 2024-04-21: qty 90, 90d supply, fill #1

## 2024-01-23 MED FILL — Pravastatin Sodium Tab 20 MG: ORAL | 90 days supply | Qty: 90 | Fill #1 | Status: AC

## 2024-01-23 NOTE — Telephone Encounter (Signed)
 Requesting: hydrocodone  7.5-325mg   Contract:11/23/22 UDS:05/21/23 Last Visit: 12/04/23 Next Visit:04/14/24 Last Refill: 12/26/23 #90 and 0RF   Please Advise

## 2024-01-24 ENCOUNTER — Other Ambulatory Visit (HOSPITAL_BASED_OUTPATIENT_CLINIC_OR_DEPARTMENT_OTHER): Payer: Self-pay

## 2024-01-24 ENCOUNTER — Other Ambulatory Visit: Payer: Self-pay

## 2024-01-24 MED ORDER — HYDROCODONE-ACETAMINOPHEN 7.5-325 MG PO TABS
1.0000 | ORAL_TABLET | Freq: Three times a day (TID) | ORAL | 0 refills | Status: DC | PRN
  Filled 2024-01-24: qty 90, 30d supply, fill #0

## 2024-01-24 NOTE — Progress Notes (Signed)
 Cardiology Office Note:    Date:  01/31/2024   ID:  [name], DOB [birth year], MRN [id number]  PCP:  Amon Aloysius BRAVO, MD  Cardiologist:  Maude Emmer, MD   Electrophysiologist:  None   Referring MD: Amon Aloysius BRAVO, MD      Patient Profile:    [name] is a 78 y.o. female with:  Hypothyroidism Hyperlipidemia  Migraine HAs Fibromyalgia  Chest pain, Palpitations   Evaluation in 1/17 >> Rare PVCs on tele; Myoview  neg for ischemia  Ex-smoker Coronary artery Ca2+ on CT 09/2018 Myoview  12/2018: low risk  Aortic atherosclerosis COPD  Prior CV studies: Myoview  12/23/2018 EF 74, no ischemia; low risk   History of Present Illness:    78 y.o. history of hypothyroidism, fibromyalgia atypical chest pain with normal myovue in 2017.and again in September of 2020 Benign palpitations with rare PvCls on monitor She is a previous heavy smoker with COPD She has had multiple back surgeries Seen by primary and noted some LE edema with no other congestive symptoms BMET and UA where normal Started on HCTZ with improvement 10/30/21   She is from [place] lived in [place]  and worked for [employer] Dependant edema improved with HCTZ she take latter in afternoon   She has lost 40 lbs using [employer]   Chronic back pain on Norco. Dr Burnetta has operated on her in past     Past Medical History:  Diagnosis Date   Allergy see chart   Anxiety 06/2020   back pain   Arthritis    Back pain    Back pain    Cataract    both eyes rt. 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   Fibromyalgia    see rheumatology   GERD (gastroesophageal reflux disease)    PAST HX    High blood pressure    High cholesterol    Hyperlipidemia    Hypothyroidism    Joint pain    Migraine     Ocular rosacea 03/2009   on doxy   Osteoporosis    osteopenia   Pleuritic chest pain    etiology unclear, has beeb eval by pulmonary and rheumatology (autoimmune dz?)   Swelling of lower extremity    Wrist fracture 2018    Current Medications: Current Meds  Medication Sig   albuterol  (VENTOLIN  HFA) 108 (90 Base) MCG/ACT inhaler Inhale 2 puffs into the lungs every 6 (six) hours as needed for wheezing or shortness of breath.   aspirin  81 MG EC tablet Take 81 mg by mouth daily. Swallow whole.   calcium  carbonate (OS-CAL) 1250 (500 CA) MG chewable tablet Chew 1 tablet by mouth 2 (two) times daily.   Cholecalciferol  (VITAMIN D3) 50 MCG (2000 UT) CHEW Chew 2,000 Units by mouth daily.   denosumab  (PROLIA ) 60 MG/ML SOSY injection Inject 60 mg into the skin every 6 (six) months.   Estradiol  (YUVAFEM ) 10 MCG TABS vaginal tablet Place 1 tablet (10 mcg total) vaginally 2 (two) times a week.   fluticasone -salmeterol (WIXELA INHUB ) 100-50 MCG/ACT AEPB Inhale 1 puff into the lungs 2 (two) times daily.   hydrochlorothiazide  (MICROZIDE ) 12.5 MG capsule Take 1 capsule (12.5 mg total) by mouth daily.  HYDROcodone -acetaminophen  (NORCO) 7.5-325 MG tablet Take 1 tablet by mouth 3 (three) times daily as needed for moderate pain (pain score 4-6).   hydroxypropyl methylcellulose / hypromellose (ISOPTO TEARS / GONIOVISC) 2.5 % ophthalmic solution Place 1 drop into both eyes 3 (three) times daily as needed for dry eyes.   levothyroxine  (SYNTHROID ) 88 MCG tablet Take 1 tablet (88 mcg total) by mouth every morning before breakfast.   linaclotide  (LINZESS ) 145 MCG CAPS capsule Take 1 capsule (145 mcg total) by mouth daily before breakfast.   Multiple Vitamin (MULTIVITAMIN) capsule Take 1 capsule by mouth daily.   naloxone  (NARCAN ) 4 MG/0.1ML LIQD nasal spray kit Use as directed   omeprazole  (PRILOSEC ) 40 MG capsule Take 1 capsule (40 mg total) by mouth daily 30 mins. before breakfast.   potassium chloride   (KLOR-CON  M) 10 MEQ tablet Take 1 tablet (10 mEq total) by mouth daily.   pravastatin  (PRAVACHOL ) 20 MG tablet Take 1 tablet (20 mg total) by mouth at bedtime.   trimethoprim -polymyxin b  (POLYTRIM ) ophthalmic solution Place 1 drop into the left eye 4 (four) times daily as directed.   vitamin E 1000 UNIT capsule    zolpidem  (AMBIEN ) 10 MG tablet Take 0.5-1 tablets (5-10 mg total) by mouth at bedtime as needed for sleep.   Current Facility-Administered Medications for the 01/31/24 encounter (Office Visit) with Alainna Stawicki C, MD  Medication   [START ON 05/09/2024] denosumab  (PROLIA ) injection 60 mg     Allergies:   Alendronate sodium, Bactrim  [sulfamethoxazole -trimethoprim ], Contrast media [iodinated contrast media], Iodine, Milnacipran, Penicillins, and Prednisone    Social History   Tobacco Use   Smoking status: Former    Current packs/day: 0.00    Average packs/day: 1 pack/day for 38.5 years (38.5 ttl pk-yrs)    Types: Cigarettes    Start date: 07/14/1965    Quit date: 01/13/2004    Years since quitting: 20.0   Smokeless tobacco: Never   Tobacco comments:    quit 2005  Vaping Use   Vaping status: Never Used  Substance Use Topics   Alcohol use: No    Comment: rare   Drug use: No     Family Hx: The patient's family history is negative for Cancer, Depression, Diabetes, Stroke, Hypertension, and Heart disease. She was adopted.  ROS   EKGs/Labs/Other Test Reviewed:    EKG:  EKG  11/30/21 SR rate 66 nonspecific ST changes   Recent Labs: 12/04/2023: BUN 13; Creatinine, Ser 0.75; Hemoglobin 13.8; Platelets 231.0; Potassium 3.9; Sodium 144; TSH 0.68   Recent Lipid Panel Lab Results  Component Value Date/Time   CHOL 141 05/21/2023 10:55 AM   TRIG 62.0 05/21/2023 10:55 AM   TRIG 133 02/21/2006 09:54 AM   HDL 66.50 05/21/2023 10:55 AM   CHOLHDL 2 05/21/2023 10:55 AM   LDLCALC 62 05/21/2023 10:55 AM   LDLCALC 52 01/29/2020 10:56 AM   LDLDIRECT 92.0 03/04/2007 09:33 AM     Physical Exam:    VS:  BP (!) 151/77 (BP Location: Left Arm, Patient Position: Sitting, Cuff Size: Normal)   Pulse 70   Resp 16   Ht 5' 5 (1.651 m)   Wt 146 lb 6.4 oz (66.4 kg)   LMP 04/10/1998 (Approximate)   SpO2 99%   BMI 24.36 kg/m     Wt Readings from Last 3 Encounters:  01/31/24 146 lb 6.4 oz (66.4 kg)  12/04/23 145 lb (65.8 kg)  11/29/23 145 lb (65.8 kg)     Affect appropriate Healthy:  appears stated  age HEENT: normal Neck supple with no adenopathy JVP normal no bruits no thyromegaly Lungs clear with no wheezing and good diaphragmatic motion Heart:  S1/S2 no murmur, no rub, gallop or click PMI normal Abdomen: benighn, BS positve, no tenderness, no AAA no bruit.  No HSM or HJR Distal pulses intact with no bruits Plus one bilateral edema Neuro non-focal Skin warm and dry No muscular weakness     ASSESSMENT & PLAN:    Chest pain: atypical normal myovue x 2 most recently 2020 observe COPD quit smoking around 2006 f/u primary regarding lung cancer CT needs On Wixela  Thyroid : continue synthroid  replacement TSH normal 12/04/23 HLD:  on statin labs with primary Had some LAD calcium  and aortic atherosclerosis noted on lung CT 2020  LDL at goal 55 07/14/22  Edema:  dependant from venous dx and obesity HCTZ 12.5 mg daily improved Cr 0.75 and K 3.9 12/04/23   F/U in a year   Medication Adjustments/Labs and Tests Ordered: Current medicines are reviewed at length with the patient today.  Concerns regarding medicines are outlined above.  Tests Ordered: Orders Placed This Encounter  Procedures   EKG 12-Lead    Medication Changes: No orders of the defined types were placed in this encounter.    Signed, Maude Emmer, MD  01/31/2024 10:23 AM    [facility] [address] [place], [place], [place]  [address] Phone: [phone]; Fax: [phone]

## 2024-01-24 NOTE — Telephone Encounter (Signed)
PDMP okay, prescription sent 

## 2024-01-25 ENCOUNTER — Other Ambulatory Visit (HOSPITAL_BASED_OUTPATIENT_CLINIC_OR_DEPARTMENT_OTHER): Payer: Self-pay

## 2024-01-28 ENCOUNTER — Other Ambulatory Visit (HOSPITAL_BASED_OUTPATIENT_CLINIC_OR_DEPARTMENT_OTHER): Payer: Self-pay

## 2024-01-28 ENCOUNTER — Other Ambulatory Visit: Payer: Self-pay

## 2024-01-29 ENCOUNTER — Other Ambulatory Visit (HOSPITAL_BASED_OUTPATIENT_CLINIC_OR_DEPARTMENT_OTHER): Payer: Self-pay

## 2024-01-31 ENCOUNTER — Encounter: Payer: Self-pay | Admitting: Cardiovascular Disease

## 2024-01-31 ENCOUNTER — Ambulatory Visit: Attending: Internal Medicine | Admitting: Cardiovascular Disease

## 2024-01-31 VITALS — BP 151/77 | HR 70 | Resp 16 | Ht 65.0 in | Wt 146.4 lb

## 2024-01-31 DIAGNOSIS — I493 Ventricular premature depolarization: Secondary | ICD-10-CM

## 2024-01-31 DIAGNOSIS — R079 Chest pain, unspecified: Secondary | ICD-10-CM | POA: Diagnosis not present

## 2024-01-31 NOTE — Patient Instructions (Signed)
 Medication Instructions:   NO CHANGES *If you need a refill on your cardiac medications before your next appointment, please call your pharmacy*   Lab Work: NOT NEEDED If you have labs (blood work) drawn today and your tests are completely normal, you will receive your results only by: MyChart Message (if you have MyChart) OR A paper copy in the mail If you have any lab test that is abnormal or we need to change your treatment, we will call you to review the results.   Testing/Procedures:  NOT NEEDED  Follow-Up: At [facility], you and your health needs are our priority.  As part of our continuing mission to provide you with exceptional heart care, we have created designated Provider Care Teams.  These Care Teams include your primary Cardiologist (physician) and Advanced Practice Providers (APPs -  Physician Assistants and Nurse Practitioners) who all work together to provide you with the care you need, when you need it.     Your next appointment:   12 month(s)  The format for your next appointment:   In Person  Provider:   Maude Emmer, MD

## 2024-02-01 ENCOUNTER — Other Ambulatory Visit (HOSPITAL_COMMUNITY): Payer: Self-pay

## 2024-02-05 ENCOUNTER — Other Ambulatory Visit (HOSPITAL_BASED_OUTPATIENT_CLINIC_OR_DEPARTMENT_OTHER): Payer: Self-pay

## 2024-02-11 ENCOUNTER — Encounter: Payer: Self-pay | Admitting: Radiology

## 2024-02-18 ENCOUNTER — Other Ambulatory Visit: Payer: Self-pay | Admitting: Internal Medicine

## 2024-02-19 ENCOUNTER — Other Ambulatory Visit (HOSPITAL_BASED_OUTPATIENT_CLINIC_OR_DEPARTMENT_OTHER): Payer: Self-pay

## 2024-02-19 ENCOUNTER — Encounter (HOSPITAL_BASED_OUTPATIENT_CLINIC_OR_DEPARTMENT_OTHER): Payer: Self-pay

## 2024-02-19 MED ORDER — HYDROCODONE-ACETAMINOPHEN 7.5-325 MG PO TABS
1.0000 | ORAL_TABLET | Freq: Three times a day (TID) | ORAL | 0 refills | Status: DC | PRN
  Filled 2024-02-19 – 2024-02-22 (×2): qty 90, 30d supply, fill #0
  Filled ????-??-??: fill #0

## 2024-02-19 NOTE — Telephone Encounter (Signed)
  Patient comment: I am leaving for [place]  on Thursday11/13 and won't return to 11/18. I was wondering if this refill can be done earlier than usual. Thanks     See above.   Requesting: hydrocodone  7.5-325mg   Contract:11/23/22 UDS:05/21/23 Last Visit: 12/04/23 Next Visit: 04/14/24 Last Refill: 01/24/24 #90 and 0RF   Please Advise

## 2024-02-22 ENCOUNTER — Other Ambulatory Visit (HOSPITAL_BASED_OUTPATIENT_CLINIC_OR_DEPARTMENT_OTHER): Payer: Self-pay

## 2024-02-26 ENCOUNTER — Other Ambulatory Visit: Payer: Self-pay

## 2024-02-26 ENCOUNTER — Other Ambulatory Visit (HOSPITAL_COMMUNITY): Payer: Self-pay

## 2024-02-27 ENCOUNTER — Other Ambulatory Visit (HOSPITAL_BASED_OUTPATIENT_CLINIC_OR_DEPARTMENT_OTHER): Payer: Self-pay

## 2024-02-27 ENCOUNTER — Encounter: Payer: Self-pay | Admitting: Bariatrics

## 2024-02-27 ENCOUNTER — Ambulatory Visit: Admitting: Bariatrics

## 2024-02-27 ENCOUNTER — Other Ambulatory Visit: Payer: Self-pay | Admitting: Internal Medicine

## 2024-02-27 VITALS — BP 129/77 | HR 61 | Ht 65.0 in | Wt 142.0 lb

## 2024-02-27 DIAGNOSIS — K5901 Slow transit constipation: Secondary | ICD-10-CM | POA: Diagnosis not present

## 2024-02-27 DIAGNOSIS — R7303 Prediabetes: Secondary | ICD-10-CM | POA: Diagnosis not present

## 2024-02-27 DIAGNOSIS — Z6823 Body mass index (BMI) 23.0-23.9, adult: Secondary | ICD-10-CM

## 2024-02-27 DIAGNOSIS — E669 Obesity, unspecified: Secondary | ICD-10-CM | POA: Diagnosis not present

## 2024-02-27 MED ORDER — LINACLOTIDE 145 MCG PO CAPS
145.0000 ug | ORAL_CAPSULE | Freq: Every day | ORAL | 2 refills | Status: AC
  Filled 2024-02-27: qty 30, 30d supply, fill #0
  Filled 2024-03-31: qty 30, 30d supply, fill #1
  Filled 2024-05-11: qty 30, 30d supply, fill #2

## 2024-02-27 MED ORDER — ZOLPIDEM TARTRATE 10 MG PO TABS
5.0000 mg | ORAL_TABLET | Freq: Every evening | ORAL | 3 refills | Status: AC | PRN
  Filled 2024-02-27: qty 30, 30d supply, fill #0
  Filled 2024-03-31: qty 30, 30d supply, fill #1

## 2024-02-27 NOTE — Progress Notes (Signed)
 WEIGHT SUMMARY AND BIOMETRICS  Weight Lost Since Last Visit: 0  Weight Gained Since Last Visit: 2lb   Vitals BP: 129/77 Pulse Rate: 61 SpO2: 100 %   Anthropometric Measurements Height: 5' 5 (1.651 m) Weight: 142 lb (64.4 kg) BMI (Calculated): 23.63 Weight at Last Visit: 140lb Weight Lost Since Last Visit: 0 Weight Gained Since Last Visit: 2lb Starting Weight: 184lb Total Weight Loss (lbs): 42 lb (19.1 kg)   Body Composition  Body Fat %: 37.6 % Fat Mass (lbs): 53.6 lbs Muscle Mass (lbs): 84.6 lbs Total Body Water (lbs): 63 lbs Visceral Fat Rating : 10   Other Clinical Data Fasting: no Labs: no Today's Visit #: 18 Starting Date: 11/23/21    OBESITY Dalila is here to discuss her progress with her obesity treatment plan along with follow-up of her obesity related diagnoses.    Nutrition Plan: the pescatarian plan - 20% adherence.  Current exercise: none  Interim History:  She is up 2 lbs since her last visit.  The bioimpedance scale shows that she is up approximately 1-1/2 pounds of water.  She has been traveling and states that she has been eating slightly more leading to bigger portions Eating all of the food on the plan., Protein intake is as prescribed, Is not skipping meals, and Water intake is adequate.   Pharmacotherapy: [place] is on not on any medications.  Adverse side effects: Constipation Hunger is moderately controlled.  Cravings are moderately controlled.  Assessment/Plan:   Prediabetes Last A1c was 5.6  Medication(s): no medications Lab Results  Component Value Date   HGBA1C 5.6 12/04/2023   HGBA1C 5.5 11/15/2022   HGBA1C 5.7 (H) 11/23/2021   HGBA1C 5.8 03/24/2021   HGBA1C 5.5 05/22/2015   Lab Results  Component Value Date   INSULIN  8.7 11/23/2021    Plan: Will minimize all refined carbohydrates both sweets and  starches.  Will work on the plan and exercise.  Consider both aerobic and resistance training.  Will keep protein, water, and fiber intake high.  Increase Polyunsaturated and Monounsaturated fats to increase satiety and encourage weight loss.  Discussed Holiday strategies.   Constipation (slow transit): Jeff notes constipation.   Her symptoms are chronic, but may have been exacerbated by travel and dietary changes.  She has been taking Linzess  . Constipation is moderately controlled.   Plan: Increase fiber up to 25 to 30 grams of fiber.   Increase water intake to at least 64 ounces daily.  Add MiraLAX  once daily.  May take twice a day or every other day based on results. Rx: Linzess  145 mg by mouth daily, # 30 with 2 refills.   Generalized Obesity: Current BMI BMI (Calculated): 23.63   Pharmacotherapy Plan   Mareesa is currently in the action stage of change. As such, her goal is to continue with weight loss efforts.  She has agreed  to the pescatarian plan.  Exercise goals: All adults should avoid inactivity. Some physical activity is better than none, and adults who participate in any amount of physical activity gain some health benefits. She will continue to do both cardio and resistance.   Behavioral modification strategies: increasing lean protein intake, no meal skipping, meal planning , increase water intake, better snacking choices, planning for success, increasing vegetables, weigh protein portions, measure portion sizes, work on smaller portions, and mindful eating.  Thara has agreed to follow-up with our clinic in 4 weeks.    Medications Discontinued During This Encounter  Medication Reason   potassium chloride  (KLOR-CON  M) 10 MEQ tablet Patient Preference     No orders of the defined types were placed in this encounter.     Objective:   VITALS: Per patient if applicable, see vitals. GENERAL: Alert and in no acute distress. CARDIOPULMONARY: No increased  WOB. Speaking in clear sentences.  PSYCH: Pleasant and cooperative. Speech normal rate and rhythm. Affect is appropriate. Insight and judgement are appropriate. Attention is focused, linear, and appropriate.  NEURO: Oriented as arrived to appointment on time with no prompting.   Attestation Statements:   This was prepared with the assistance of Engineer, Civil (consulting).  Occasional wrong-word or sound-a-like substitutions may have occurred due to the inherent limitations of voice recognition.  Clayborne Daring, DO

## 2024-02-27 NOTE — Telephone Encounter (Signed)
 Requesting: Ambien  10mg  Contract:11/23/22 UDS:05/21/23 Last Visit: 12/04/23 Next Visit:04/14/24 Last Refill: 10/03/23 #30 and 0RF   Please Advise

## 2024-02-28 ENCOUNTER — Other Ambulatory Visit: Payer: Self-pay

## 2024-02-28 ENCOUNTER — Other Ambulatory Visit (HOSPITAL_BASED_OUTPATIENT_CLINIC_OR_DEPARTMENT_OTHER): Payer: Self-pay

## 2024-03-10 DIAGNOSIS — H16141 Punctate keratitis, right eye: Secondary | ICD-10-CM | POA: Diagnosis not present

## 2024-03-12 ENCOUNTER — Other Ambulatory Visit (HOSPITAL_BASED_OUTPATIENT_CLINIC_OR_DEPARTMENT_OTHER): Payer: Self-pay

## 2024-03-12 DIAGNOSIS — H16223 Keratoconjunctivitis sicca, not specified as Sjogren's, bilateral: Secondary | ICD-10-CM | POA: Diagnosis not present

## 2024-03-12 MED ORDER — FLUOROMETHOLONE 0.1 % OP SUSP
1.0000 [drp] | Freq: Four times a day (QID) | OPHTHALMIC | 3 refills | Status: AC
  Filled 2024-03-12: qty 5, 25d supply, fill #0
  Filled 2024-04-08: qty 5, 25d supply, fill #1

## 2024-03-13 DIAGNOSIS — K080 Exfoliation of teeth due to systemic causes: Secondary | ICD-10-CM | POA: Diagnosis not present

## 2024-03-17 ENCOUNTER — Other Ambulatory Visit (HOSPITAL_BASED_OUTPATIENT_CLINIC_OR_DEPARTMENT_OTHER): Payer: Self-pay | Admitting: Internal Medicine

## 2024-03-17 DIAGNOSIS — Z1231 Encounter for screening mammogram for malignant neoplasm of breast: Secondary | ICD-10-CM

## 2024-03-19 ENCOUNTER — Other Ambulatory Visit (HOSPITAL_BASED_OUTPATIENT_CLINIC_OR_DEPARTMENT_OTHER): Payer: Self-pay

## 2024-03-19 ENCOUNTER — Other Ambulatory Visit: Payer: Self-pay | Admitting: Family

## 2024-03-19 MED ORDER — HYDROCODONE-ACETAMINOPHEN 7.5-325 MG PO TABS
1.0000 | ORAL_TABLET | Freq: Three times a day (TID) | ORAL | 0 refills | Status: DC | PRN
  Filled 2024-03-19 – 2024-03-24 (×2): qty 90, 30d supply, fill #0
  Filled 2024-03-24: qty 21, 7d supply, fill #0

## 2024-03-19 NOTE — Telephone Encounter (Signed)
 Requesting: hydrocodone  7.5-325mg   Contract:11/18/22 UDS:05/21/23 Last Visit: 12/04/23 Next Visit: 04/14/24 Last Refill: 02/19/24 #90 and 0RF   Please Advise

## 2024-03-20 ENCOUNTER — Other Ambulatory Visit (HOSPITAL_BASED_OUTPATIENT_CLINIC_OR_DEPARTMENT_OTHER): Payer: Self-pay

## 2024-03-20 ENCOUNTER — Other Ambulatory Visit: Payer: Self-pay

## 2024-03-24 ENCOUNTER — Other Ambulatory Visit: Payer: Self-pay

## 2024-03-24 ENCOUNTER — Other Ambulatory Visit (HOSPITAL_BASED_OUTPATIENT_CLINIC_OR_DEPARTMENT_OTHER): Payer: Self-pay

## 2024-03-25 ENCOUNTER — Ambulatory Visit: Admitting: Sports Medicine

## 2024-03-25 ENCOUNTER — Other Ambulatory Visit: Payer: Self-pay

## 2024-03-25 ENCOUNTER — Encounter: Payer: Self-pay | Admitting: Sports Medicine

## 2024-03-25 DIAGNOSIS — M25551 Pain in right hip: Secondary | ICD-10-CM | POA: Diagnosis not present

## 2024-03-25 DIAGNOSIS — M1611 Unilateral primary osteoarthritis, right hip: Secondary | ICD-10-CM

## 2024-03-25 DIAGNOSIS — G8929 Other chronic pain: Secondary | ICD-10-CM | POA: Diagnosis not present

## 2024-03-25 MED ORDER — METHYLPREDNISOLONE ACETATE 40 MG/ML IJ SUSP
60.0000 mg | INTRAMUSCULAR | Status: AC | PRN
  Administered 2024-03-25: 11:00:00 60 mg via INTRA_ARTICULAR

## 2024-03-25 MED ORDER — LIDOCAINE HCL 1 % IJ SOLN
4.0000 mL | INTRAMUSCULAR | Status: AC | PRN
  Administered 2024-03-25: 11:00:00 4 mL

## 2024-03-25 NOTE — Progress Notes (Signed)
 Patient says that she did well with the last injection for about 3 months. She is interested in repeating that xray today, and discussing hip replacement. She would also like to begin physical therapy after January 1st, as she would like to strengthen her hip before possible hip replacement.

## 2024-03-25 NOTE — Progress Notes (Signed)
 [name] - 78 y.o. female MRN [id number]  Date of birth: [birth year]  Office Visit Note: Visit Date: 03/25/2024 PCP: Amon Aloysius BRAVO, MD Referred by: Amon Aloysius BRAVO, MD  Subjective: Chief Complaint  Patient presents with   Right Hip - Pain   HPI: [name] is a pleasant 78 y.o. female who presents today for acute on chronic right hip pain with advanced OA.  Last injection was 09/20/23 - worked very well for 3 months, but in past had given her 6 months of relief or more. [name] states that she did well with the last injection for about 3 months. She is interested in repeating that xray today, and discussing hip replacement. She would also like to begin physical therapy after January 1st, as she would like to strengthen her hip before possible hip replacement. She is managed on Norco 7.5-325 mg which she takes up to 3x daily depending on her pain and activity level.   Pertinent ROS were reviewed with the patient and found to be negative unless otherwise specified above in HPI.   Assessment & Plan: Visit Diagnoses:  1. Primary osteoarthritis of right hip   2. Chronic right hip pain    Plan: Impression is exacerbation of chronic right hip pain with moderate to severe osteoarthritis.  In the past she has been receiving 4 to 6 months of very good relief from intra-articular injection but these are beginning to wane in duration of relief.  Her last back in June gave her very good relief for 3 months and then started to return after her pain returned.  She is rather flared up at this point and has pain with motion.  Shared decision making, we did repeat ultrasound-guided intra-articular injection, patient tolerated well.  Advised on postinjection protocol. She may use ice/heat or Tylenol  for any postinjection pain. She is managed on chronic pain medication with Norco 7.5-325 mg which she will take twice to three times daily as needed for her hip pain and chronic pain.  Given the advancement of  her arthritis and the fact that corticosteroid injections are certainly still helpful but are not lasting as long, I do think she would benefit following up with Dr. Vernetta to discuss possible total hip arthroplasty.  She is interested in maximizing her function and calming down the surrounding musculature with physical therapy in the interim, which I believe is a great idea.  Referral sent to med Center at [facility] for her to begin.  She will see Dr. Vernetta in about 6 weeks to discuss possible THA.   Follow-up: Return in about 6 weeks (around 05/06/2024) for Dr. Vernetta for hip replacement discussion, Right.   Meds & Orders: No orders of the defined types were placed in this encounter.   Orders Placed This Encounter  Procedures   Large Joint Inj   US  Guided Needle Placement - No Linked Charges   Ambulatory referral to Physical Therapy     Procedures: Large Joint Inj: R hip joint on 03/25/2024 10:41 AM Indications: pain Details: 22 G 3.5 in needle, ultrasound-guided anterior approach Medications: 4 mL lidocaine  1 %; 60 mg methylPREDNISolone  acetate 40 MG/ML Outcome: tolerated well, no immediate complications  Procedure: US -guided intra-articular hip injection, Right After discussion on risks/benefits/indications and informed verbal consent was obtained, a timeout was performed. Patient was lying supine on exam table. The hip was cleaned with betadine and alcohol swabs. Then utilizing ultrasound guidance, the patient's femoral head and neck junction was identified  and subsequently injected with 4:1.5 lidocaine :depomedrol via an in-plane approach with ultrasound visualization of the injectate administered into the hip joint. Patient tolerated procedure well without immediate complications.  Procedure, treatment alternatives, risks and benefits explained, specific risks discussed. Consent was given by the patient. Immediately prior to procedure a time out was called to verify the correct  patient, procedure, equipment, support staff and site/side marked as required. Patient was prepped and draped in the usual sterile fashion.          Clinical History: No specialty comments available.  She reports that she quit smoking about 20 years ago. Her smoking use included cigarettes. She started smoking about 58 years ago. She has a 38.5 pack-year smoking history. She has never used smokeless tobacco.  Recent Labs    12/04/23 0939  HGBA1C 5.6    Objective:   Vital Signs: LMP 04/10/1998   Physical Exam  Gen: Well-appearing, in no acute distress; non-toxic CV: Well-perfused. Warm.  Resp: Breathing unlabored on room air; no wheezing. Psych: Fluid speech in conversation; appropriate affect; normal thought process  Ortho Exam - Right hip: Trace to small effusion of the right hip.  Patient has pain and limitation with internal and external logroll.  Positive FADIR test, positive Stinchfield test.  Patient has to assist with flexion bringing the hip onto the table with lying supine.  Imaging:  03/07/23: An AP pelvis and lateral right hip shows moderate to significant arthritic  findings of the right hip.  There is superior lateral joint space  narrowing.  There is osteophytes of the superior lateral acetabulum and  femoral head and inferior osteophytes of the femoral head.  There is mild  arthritic changes in the left hip on the AP view.   Past Medical/Family/Surgical/Social History: Medications & Allergies reviewed per EMR, new medications updated. Patient Active Problem List   Diagnosis Date Noted   BMI 24.0-24.9, adult 07/26/2022   Other constipation 02/01/2022   Primary osteoarthritis of right hip 12/13/2021   Spondylosis without myelopathy or radiculopathy, lumbar region 12/13/2021   Other fatigue 11/23/2021   Hyperlipidemia 11/23/2021   Hyperglycemia 11/23/2021   Vitamin D  deficiency 11/23/2021   Vitamin B 12 deficiency 11/23/2021   COPD (chronic obstructive  pulmonary disease) (HCC) 08/22/2019   Spinal stenosis of lumbar region with neurogenic claudication 11/22/2018   Coronary artery disease due to lipid rich plaque 10/03/2018   Chronic back pain 03/15/2017   PCP NOTES >>>>>>>>>>>>>>>>>>>> 05/24/2015   Insomnia 09/01/2014   GERD (gastroesophageal reflux disease) 03/27/2013   Annual physical exam 06/12/2011   Sinusitis, chronic 02/02/2011   Osteoporosis 06/08/2010   Migraine without aura 03/04/2007   Hypothyroidism 05/15/2006   History of colonic polyps 05/15/2006   Past Medical History:  Diagnosis Date   Allergy see chart   Anxiety 06/2020   back pain   Arthritis    Back pain    Back pain    Cataract    both eyes rt. eye was removed   Chest pain    stress test @ [facility] (-)   Colonic polyp    bx adenomatous polyps, next scope 2010   Constipation    COPD (chronic obstructive pulmonary disease) (HCC)    Cornea disorder    NODULES REMOVED FROM CORNEA   Fibromyalgia    see rheumatology   GERD (gastroesophageal reflux disease)    PAST HX    High blood pressure    High cholesterol    Hyperlipidemia    Hypothyroidism  Joint pain    Migraine    Ocular rosacea 03/2009   on doxy   Osteoporosis    osteopenia   Pleuritic chest pain    etiology unclear, has beeb eval by pulmonary and rheumatology (autoimmune dz?)   Swelling of lower extremity    Wrist fracture 2018   Family History  Adopted: Yes  Problem Relation Age of Onset   Cancer Neg Hx    Depression Neg Hx    Diabetes Neg Hx    Stroke Neg Hx    Hypertension Neg Hx    Heart disease Neg Hx    Past Surgical History:  Procedure Laterality Date   ANTERIOR LAT LUMBAR FUSION N/A 12/03/2019   Procedure: ANTERIOR LATERAL LUMBAR FUSION L2-4;  Surgeon: Burnetta Aures, MD;  Location: [facility] OR;  Service: Orthopedics;  Laterality: N/A;  4 hrs   COLONOSCOPY     COSMETIC SURGERY  04/12/2018   L upper eyelid ptosis repair, and trichophytic brow lift    ESOPHAGOGASTRODUODENOSCOPY (EGD) WITH PROPOFOL  N/A 03/27/2013   Procedure: ESOPHAGOGASTRODUODENOSCOPY (EGD) WITH PROPOFOL ;  Surgeon: Toribio SHAUNNA Cedar, MD;  Location: [facility];  Service: Endoscopy;  Laterality: N/A;  possible dil   EYE SURGERY     growth on cornea-bil.   FRACTURE SURGERY  hand 2017   plastic surgery face  2013   POLYPECTOMY     right rotator cuff repair  2010   SPINE SURGERY     TONSILLECTOMY     UPPER GASTROINTESTINAL ENDOSCOPY     Social History   Occupational History   Occupation: retired, warehouse manager work    Associate Professor: RETIRED  Tobacco Use   Smoking status: Former    Current packs/day: 0.00    Average packs/day: 1 pack/day for 38.5 years (38.5 ttl pk-yrs)    Types: Cigarettes    Start date: 07/14/1965    Quit date: 01/13/2004    Years since quitting: 20.2   Smokeless tobacco: Never   Tobacco comments:    quit 2005  Vaping Use   Vaping status: Never Used  Substance and Sexual Activity   Alcohol use: No    Comment: rare   Drug use: No   Sexual activity: Not Currently    Birth control/protection: Post-menopausal    Comment: first intercourse >16, more than 5 partners

## 2024-03-28 ENCOUNTER — Ambulatory Visit: Admitting: Internal Medicine

## 2024-03-30 NOTE — Telephone Encounter (Signed)
 Prolia  VOB initiated via [email or web address]  Next Prolia  inj DUE: 05/10/24

## 2024-04-01 ENCOUNTER — Other Ambulatory Visit (HOSPITAL_BASED_OUTPATIENT_CLINIC_OR_DEPARTMENT_OTHER): Payer: Self-pay

## 2024-04-01 ENCOUNTER — Other Ambulatory Visit: Payer: Self-pay

## 2024-04-02 ENCOUNTER — Other Ambulatory Visit (HOSPITAL_BASED_OUTPATIENT_CLINIC_OR_DEPARTMENT_OTHER): Payer: Self-pay

## 2024-04-14 ENCOUNTER — Ambulatory Visit (INDEPENDENT_AMBULATORY_CARE_PROVIDER_SITE_OTHER): Admitting: Internal Medicine

## 2024-04-14 ENCOUNTER — Encounter (HOSPITAL_BASED_OUTPATIENT_CLINIC_OR_DEPARTMENT_OTHER): Payer: Self-pay

## 2024-04-14 ENCOUNTER — Ambulatory Visit (HOSPITAL_BASED_OUTPATIENT_CLINIC_OR_DEPARTMENT_OTHER)
Admission: RE | Admit: 2024-04-14 | Discharge: 2024-04-14 | Disposition: A | Discharge status: Home / Self Care | Source: Ambulatory Visit | Attending: Internal Medicine | Admitting: Internal Medicine

## 2024-04-14 ENCOUNTER — Other Ambulatory Visit (HOSPITAL_BASED_OUTPATIENT_CLINIC_OR_DEPARTMENT_OTHER): Payer: Self-pay

## 2024-04-14 ENCOUNTER — Encounter: Payer: Self-pay | Admitting: Internal Medicine

## 2024-04-14 VITALS — BP 126/74 | HR 75 | Temp 98.0°F | Resp 16 | Ht 65.0 in | Wt 146.2 lb

## 2024-04-14 DIAGNOSIS — E038 Other specified hypothyroidism: Secondary | ICD-10-CM | POA: Diagnosis not present

## 2024-04-14 DIAGNOSIS — Z1231 Encounter for screening mammogram for malignant neoplasm of breast: Secondary | ICD-10-CM | POA: Insufficient documentation

## 2024-04-14 DIAGNOSIS — G8929 Other chronic pain: Secondary | ICD-10-CM

## 2024-04-14 DIAGNOSIS — Z79899 Other long term (current) drug therapy: Secondary | ICD-10-CM | POA: Diagnosis not present

## 2024-04-14 DIAGNOSIS — M549 Dorsalgia, unspecified: Secondary | ICD-10-CM | POA: Diagnosis not present

## 2024-04-14 DIAGNOSIS — Z09 Encounter for follow-up examination after completed treatment for conditions other than malignant neoplasm: Secondary | ICD-10-CM | POA: Diagnosis not present

## 2024-04-14 DIAGNOSIS — R739 Hyperglycemia, unspecified: Secondary | ICD-10-CM | POA: Diagnosis not present

## 2024-04-14 DIAGNOSIS — J441 Chronic obstructive pulmonary disease with (acute) exacerbation: Secondary | ICD-10-CM | POA: Diagnosis not present

## 2024-04-14 DIAGNOSIS — G4700 Insomnia, unspecified: Secondary | ICD-10-CM

## 2024-04-14 LAB — TSH: TSH: 1.31 u[IU]/mL (ref 0.35–5.50)

## 2024-04-14 LAB — BASIC METABOLIC PANEL WITH GFR
BUN: 19 mg/dL (ref 6–23)
CO2: 32 meq/L (ref 19–32)
Calcium: 9.1 mg/dL (ref 8.4–10.5)
Chloride: 98 meq/L (ref 96–112)
Creatinine, Ser: 0.75 mg/dL (ref 0.40–1.20)
GFR: 76.41 mL/min
Glucose, Bld: 63 mg/dL — ABNORMAL LOW (ref 70–99)
Potassium: 3.2 meq/L — ABNORMAL LOW (ref 3.5–5.1)
Sodium: 139 meq/L (ref 135–145)

## 2024-04-14 MED ORDER — BENZONATATE 200 MG PO CAPS
200.0000 mg | ORAL_CAPSULE | Freq: Three times a day (TID) | ORAL | 0 refills | Status: AC | PRN
  Filled 2024-04-14: qty 21, 7d supply, fill #0

## 2024-04-14 NOTE — Progress Notes (Signed)
 "  Subjective:    Patient ID: [name], female    DOB: 12-15-1945, 79 y.o.   MRN: [id number]  DOS:  04/14/2024 Follow-up  Discussed the use of AI scribe software for clinical note transcription with the patient, who gave verbal consent to proceed.  History of Present Illness [name] is a 79 year old female    Right jaw swelling and pain - Onset three days ago with right jaw swelling and pain - Pain worsened with eating - Swelling and pain improved after two days of self rx  doxycycline  - Symptoms have since decreased  Upper respiratory symptoms - Onset approximately one week ago with flu-like symptoms - Fever up to 102F - Persistent cough with green then brown sputum, no hemoptysis - Intermittent chest heaviness - Used Mucinex DM and albuterol  as needed for chest symptoms - Continued regular use of Wixela  Chronic hip pain - Chronic hip pain managed with hydrocodone  three times daily - Pain improved with recent decreased activity - Pain worsens with normal activity    Review of Systems See above   Past Medical History:  Diagnosis Date   Allergy see chart   Anxiety 06/2020   back pain   Arthritis    Back pain    Back pain    Cataract    both eyes rt. 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   Fibromyalgia    see rheumatology   GERD (gastroesophageal reflux disease)    PAST HX    High blood pressure    High cholesterol    Hyperlipidemia    Hypothyroidism    Joint pain    Migraine    Ocular rosacea 03/2009   on doxy   Osteoporosis    osteopenia   Pleuritic chest pain    etiology unclear, has beeb eval by pulmonary and rheumatology (autoimmune dz?)   Swelling of lower extremity    Wrist fracture 2018    Past Surgical History:   Procedure Laterality Date   ANTERIOR LAT LUMBAR FUSION N/A 12/03/2019   Procedure: ANTERIOR LATERAL LUMBAR FUSION L2-4;  Surgeon: Burnetta Aures, MD;  Location: [facility];  Service: Orthopedics;  Laterality: N/A;  4 hrs   COLONOSCOPY     COSMETIC SURGERY  04/12/2018   L upper eyelid ptosis repair, and trichophytic brow lift   ESOPHAGOGASTRODUODENOSCOPY (EGD) WITH PROPOFOL  N/A 03/27/2013   Procedure: ESOPHAGOGASTRODUODENOSCOPY (EGD) WITH PROPOFOL ;  Surgeon: Toribio SHAUNNA Cedar, MD;  Location: [facility];  Service: Endoscopy;  Laterality: N/A;  possible dil   EYE SURGERY     growth on cornea-bil.   FRACTURE SURGERY  hand 2017   plastic surgery face  2013   POLYPECTOMY     right rotator cuff repair  2010   SPINE SURGERY     TONSILLECTOMY     UPPER GASTROINTESTINAL ENDOSCOPY      Current Outpatient Medications  Medication Instructions   albuterol  (VENTOLIN  HFA) 108 (90 Base) MCG/ACT inhaler 2 puffs, Inhalation, Every 6 hours PRN   aspirin  EC 81 mg, Daily   benzonatate  (TESSALON ) 200 mg, Oral, 3 times daily PRN   calcium  carbonate (OS-CAL) 1250 (500 CA) MG chewable tablet 1 tablet, 2 times daily  denosumab  (PROLIA ) 60 mg, Subcutaneous, Every 6 months   Estradiol  (YUVAFEM ) 10 mcg, Vaginal, 2 times weekly   fluorometholone  (FML) 0.1 % ophthalmic suspension 1 drop, Both Eyes, 4 times daily   fluticasone -salmeterol (WIXELA INHUB ) 100-50 MCG/ACT AEPB 1 puff, Inhalation, 2 times daily   hydrochlorothiazide  (MICROZIDE ) 12.5 mg, Oral, Daily   HYDROcodone -acetaminophen  (NORCO) 7.5-325 MG tablet 1 tablet, Oral, 3 times daily PRN   hydroxypropyl methylcellulose / hypromellose (ISOPTO TEARS / GONIOVISC) 2.5 % ophthalmic solution 1 drop, 3 times daily PRN   levothyroxine  (SYNTHROID ) 88 MCG tablet Take 1 tablet (88 mcg total) by mouth every morning before breakfast.   Linzess  145 mcg, Oral, Daily before breakfast   Multiple Vitamin (MULTIVITAMIN) capsule 1 capsule, Daily   naloxone  (NARCAN ) 4 MG/0.1ML  LIQD nasal spray kit Use as directed   omeprazole  (PRILOSEC ) 40 MG capsule Take 1 capsule (40 mg total) by mouth daily 30 mins. before breakfast.   pravastatin  (PRAVACHOL ) 20 mg, Oral, Daily at bedtime   trimethoprim -polymyxin b  (POLYTRIM ) ophthalmic solution Place 1 drop into the left eye 4 (four) times daily as directed.   Vitamin D3 2,000 Units, Daily   vitamin E 1000 UNIT capsule    zolpidem  (AMBIEN ) 5-10 mg, Oral, At bedtime PRN       Objective:   Physical Exam BP 126/74   Pulse 75   Temp 98 F (36.7 C) (Oral)   Resp 16   Ht 5' 5 (1.651 m)   Wt 146 lb 4 oz (66.3 kg)   LMP 04/10/1998   SpO2 99%   BMI 24.34 kg/m  General:   Well developed, NAD, BMI noted. HEENT:  Normocephalic . Face symmetric, atraumatic Neck inspection normal.  Upon palpation of the head and neck the parotid glands are symmetric, nontender.  Throat symmetric and nontender. Lungs:  Frequent cough noted but no wheezing or rhonchi on exam.  Normal respiratory effort, no intercostal retractions, no accessory muscle use. Heart: RRR,  no murmur.  Lower extremities: no pretibial edema bilaterally  Skin: Not pale. Not jaundice Neurologic:  alert & oriented X3.  Speech normal, gait appropriate for age and unassisted Psych--  Cognition and judgment appear intact.  Cooperative with normal attention span and concentration.  Behavior appropriate. No anxious or depressed appearing.      Assessment   Problem list Prediabetes  Hypothyroidism Hyperlipidemia: Started meds 09/2019 obesity Lower extremity edema: On HCTZ GERD Migraines insmonia-- xanax  prn COPD: per chest CTs, centrilobular former heavy smoker, quit 2005 [facility]- Pain mngmt  -- back pain, 2017 had a MRI, saw Dr Anderson, Rx local injection, PT --h/o fibromyalgia --H/o Pleuritic chest pain, on-off, resolved  --spinal stenosis (MRI 05/2018)  Osteopenia:  Per ENDO --T score 2011   -2.6 (intolerant to fosamax, ok w/ Boniva x years) --T score  01-2013   -2.6 --T score 02-2015  -2.3, rx ca and cit D  Glaucoma Ocular Rosacea 2010 CV: LAD  artery disease  per CT scan of the chest, CP:  admitted, stress lest low risk 05-2015.  Control CV RF CAD, aortic sclerosis: Per CT Chronic hoarseness: ENT OV 04-2022, RTC prn  Assessment & Plan COPD with acute exacerbation Self diagnosed with a viral syndrome few days ago, was not tested for flu-covid, she is still coughing but overall improved.  No significant wheezing or chest congestion. - Continue Wixela inhaler twice daily. - Use albuterol  inhaler as needed for chest heaviness. - Continue Mucinex DM for mucus management.  Rx Tessalon  Perles, call if  not better Pain management  Discussed reducing hydrocodone , but she is not ready to decrease the dose. Plans to start physical therapy to strengthen hip muscles. Parotitis: Had pain and swelling at the right parotid area, exam is now benign, likely had resolved parotitis.  She will let me know if symptoms recur. Hypothyroidism Managed with Synthroid . Check TSH [facility] 01/31/2024: - Atypical chest pain with previous negative stress test they recommend observation. -Edema felt to be dependent. Prediabetes Last A1c was very good , diet controlled  RTC 4 months     "

## 2024-04-14 NOTE — Patient Instructions (Addendum)
 THE FOLLOWING IS A SUMMARY OF YOUR INSTRUCTION PLEASE REVIEW THEM BEFORE YOU LEAVE THE OFFICE AND LET US  KNOW IF YOU HAVE QUESTIONS    GO TO THE LAB :  Get the blood work    Go to the front desk for the checkout Please make an appointment       RIGHT JAW SWELLING AND PAIN: You experienced swelling and pain in your right jaw that started three days ago but has improved after taking doxycycline . -Continue monitoring the area for any changes or worsening symptoms.  UPPER RESPIRATORY SYMPTOMS: You had flu-like symptoms with a fever, cough, and chest heaviness that started about a week ago. Your symptoms have improved. -Continue using Mucinex DM for mucus management. -Use your albuterol  inhaler as needed for chest congestion and cough -Continue using your Wixela inhaler twice daily. - Also take benzonatate  3 times a day if needed for persistent cough  CHRONIC HIP PAIN: You have chronic hip pain that is managed with hydrocodone . The pain has improved with decreased activity but worsens with normal activity. -Start physical therapy to strengthen your hip muscles. -Continue taking hydrocodone  as prescribed.    HYPOTHYROIDISM: Your hypothyroidism is managed with Synthroid . -Continue taking Synthroid  as prescribed.  PREDIABETES: Your last A1c test was very good, and there are no changes needed at this time. -Continue your current management and monitoring.

## 2024-04-15 NOTE — Assessment & Plan Note (Signed)
 COPD with acute exacerbation Self diagnosed with a viral syndrome few days ago, was not tested for flu-covid, she is still coughing but overall improved.  No significant wheezing or chest congestion. - Continue Wixela inhaler twice daily. - Use albuterol  inhaler as needed for chest heaviness. - Continue Mucinex DM for mucus management.  Rx Tessalon  Perles, call if not better Pain management  Discussed reducing hydrocodone , but she is not ready to decrease the dose. Plans to start physical therapy to strengthen hip muscles. Parotitis: Had pain and swelling at the right parotid area, exam is now benign, likely had resolved parotitis.  She will let me know if symptoms recur. Hypothyroidism Managed with Synthroid . Check TSH [facility] 01/31/2024: - Atypical chest pain with previous negative stress test they recommend observation. -Edema felt to be dependent. Prediabetes Last A1c was very good , diet controlled  RTC 4 months

## 2024-04-15 NOTE — Therapy (Incomplete)
 " OUTPATIENT PHYSICAL THERAPY LOWER EXTREMITY EVALUATION   Patient Name: [name] MRN: [id number] DOB:06/28/1945, 79 y.o., female Today's Date: 04/15/2024   END OF SESSION:   Past Medical History:  Diagnosis Date   Allergy see chart   Anxiety 06/2020   back pain   Arthritis    Back pain    Back pain    Cataract    both eyes rt. 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   Fibromyalgia    see rheumatology   GERD (gastroesophageal reflux disease)    PAST HX    High blood pressure    High cholesterol    Hyperlipidemia    Hypothyroidism    Joint pain    Migraine    Ocular rosacea 03/2009   on doxy   Osteoporosis    osteopenia   Pleuritic chest pain    etiology unclear, has beeb eval by pulmonary and rheumatology (autoimmune dz?)   Swelling of lower extremity    Wrist fracture 2018   Past Surgical History:  Procedure Laterality Date   ANTERIOR LAT LUMBAR FUSION N/A 12/03/2019   Procedure: ANTERIOR LATERAL LUMBAR FUSION L2-4;  Surgeon: Burnetta Aures, MD;  Location: [facility];  Service: Orthopedics;  Laterality: N/A;  4 hrs   COLONOSCOPY     COSMETIC SURGERY  04/12/2018   L upper eyelid ptosis repair, and trichophytic brow lift   ESOPHAGOGASTRODUODENOSCOPY (EGD) WITH PROPOFOL  N/A 03/27/2013   Procedure: ESOPHAGOGASTRODUODENOSCOPY (EGD) WITH PROPOFOL ;  Surgeon: Toribio SHAUNNA Cedar, MD;  Location[facility];  Service: Endoscopy;  Laterality: N/A;  possible dil   EYE SURGERY     growth on cornea-bil.   FRACTURE SURGERY  hand 2017   plastic surgery face  2013   POLYPECTOMY     right rotator cuff repair  2010   SPINE SURGERY     TONSILLECTOMY     UPPER GASTROINTESTINAL ENDOSCOPY     Patient Active Problem List   Diagnosis Date Noted   BMI 24.0-24.9, adult  07/26/2022   Other constipation 02/01/2022   Primary osteoarthritis of right hip 12/13/2021   Spondylosis without myelopathy or radiculopathy, lumbar region 12/13/2021   Other fatigue 11/23/2021   Hyperlipidemia 11/23/2021   Hyperglycemia 11/23/2021   Vitamin D  deficiency 11/23/2021   Vitamin B 12 deficiency 11/23/2021   COPD (chronic obstructive pulmonary disease) (HCC) 08/22/2019   Spinal stenosis of lumbar region with neurogenic claudication 11/22/2018   Coronary artery disease due to lipid rich plaque 10/03/2018   Chronic back pain 03/15/2017   PCP NOTES >>>>>>>>>>>>>>>>>>>> 05/24/2015   Insomnia 09/01/2014   GERD (gastroesophageal reflux disease) 03/27/2013   Annual physical exam 06/12/2011   Sinusitis, chronic 02/02/2011   Osteoporosis 06/08/2010   Migraine without aura 03/04/2007   Hypothyroidism 05/15/2006   History of colonic polyps 05/15/2006    PCP: Amon Aloysius BRAVO, MD   REFERRING PROVIDER: Burnetta Brunet, DO   REFERRING DIAG: F83.88 (ICD-10-CM) - Primary osteoarthritis of right hip   THERAPY DIAG:  No diagnosis found.  RATIONALE FOR EVALUATION AND TREATMENT: Rehabilitation  ONSET DATE: ***  NEXT MD VISIT: ***05/07/24 - surgical consult with Lonni Poli, MD   SUBJECTIVE:  SUBJECTIVE STATEMENT: ***  PAIN: Are you having pain? {OPRCPAIN:27236}  PERTINENT HISTORY:  ***Anxiety, arthritis, osteoporosis, migraine, hypothyroidism, glaucoma, fibromyalgia, R RTC repair, anterior lateral lumbar fusion L2-12 Nov 2019, R wrist fx, back pain, pleuritic chest pain, COPD  PRECAUTIONS: {Therapy precautions:24002}  RED FLAGS: {PT Red Flags:29287}  WEIGHT BEARING RESTRICTIONS: {Yes ***/No:24003}  FALLS:  Has patient fallen in last 6 months? {fallsyesno:27318}  LIVING  ENVIRONMENT: Lives with: {OPRC lives with:25569::lives with their family} Lives in: House - 2 story Stairs: Yes: {Stairs:24000} Has following equipment at home: {Assistive devices:23999}  OCCUPATION: Retired  PLOF: {PLOF:24004}  PATIENT GOALS: ***   OBJECTIVE: (objective measures completed at initial evaluation unless otherwise dated)  DIAGNOSTIC FINDINGS:  03/07/23 - XR Right Hip An AP pelvis and lateral right hip shows moderate to significant arthritic findings of the right hip.  There is superior lateral joint space narrowing.  There is osteophytes of the superior lateral acetabulum and femoral head and inferior osteophytes of the femoral head.  There is mild arthritic changes in the left hip on the AP view.   PATIENT SURVEYS:  LEFS  Extreme difficulty/unable (0), Quite a bit of difficulty (1), Moderate difficulty (2), Little difficulty (3), No difficulty (4) Survey date:  04/15/2024 ***  Any of your usual work, housework or school activities   2. Usual hobbies, recreational or sporting activities   3. Getting into/out of the bath   4. Walking between rooms   5. Putting on socks/shoes   6. Squatting    7. Lifting an object, like a bag of groceries from the floor   8. Performing light activities around your home   9. Performing heavy activities around your home   10. Getting into/out of a car   11. Walking 2 blocks   12. Walking 1 mile   13. Going up/down 10 stairs (1 flight)   14. Standing for 1 hour   15.  sitting for 1 hour   16. Running on even ground   17. Running on uneven ground   18. Making sharp turns while running fast   19. Hopping    20. Rolling over in bed   Score total:  ***  Functional limitation:      COGNITION: Overall cognitive status: {cognition:24006}    SENSATION: {sensation:27233}  EDEMA:  {edema:24020}  POSTURE:  {posture:25561}  PALPATION: ***  MUSCLE LENGTH: Hamstrings: Right *** deg; Left *** deg Debby test: Right *** deg;  Left *** deg Hamstrings: *** ITB: *** Piriformis: *** Hip flexors: *** Quads: *** Heelcord: ***  LOWER EXTREMITY ROM:  {AROM/PROM:27142} ROM Right eval Left eval  Hip flexion    Hip extension    Hip abduction    Hip adduction    Hip internal rotation    Hip external rotation    Knee flexion    Knee extension    Ankle dorsiflexion    Ankle plantarflexion    Ankle inversion    Ankle eversion     {AROM/PROM:27142} ROM Right eval Left eval  Hip flexion    Hip extension    Hip abduction    Hip adduction    Hip internal rotation    Hip external rotation    Knee flexion    Knee extension    Ankle dorsiflexion    Ankle plantarflexion    Ankle inversion    Ankle eversion    (Blank rows = not tested)  LOWER EXTREMITY MMT:  MMT Right eval Left eval  Hip flexion  Hip extension    Hip abduction    Hip adduction    Hip internal rotation    Hip external rotation    Knee flexion    Knee extension    Ankle dorsiflexion    Ankle plantarflexion    Ankle inversion    Ankle eversion     (Blank rows = not tested)  LOWER EXTREMITY SPECIAL TESTS:  {LEspecialtests:26242}  FUNCTIONAL TESTS:  {Functional tests:24029}  GAIT: Distance walked: *** Assistive device utilized: {Assistive devices:23999} Level of assistance: {Levels of assistance:24026} Gait pattern: {gait characteristics:25376} Comments: ***   TODAY'S TREATMENT:   *** 04/15/2024 - Eval SELF CARE:  Reviewed eval findings and role of PT in addressing identified deficits as well as instruction in initial HEP (see below).    PATIENT EDUCATION:  Education details: {Education details:27468}  Person educated: {Person educated:25204} Education method: {Education Method EU:67125} Education comprehension: {Education Comprehension:25206}  HOME EXERCISE PROGRAM: ***   ASSESSMENT:  CLINICAL IMPRESSION: CYARA DEVOTO is a 79 y.o. female who was referred to physical therapy for evaluation and treatment for R  hip pain 2 osteoarthritis.  She is scheduled to meet with Dr. Vernetta on 05/07/24 regarding surgical consultation for possible THA.  Patient reports onset of *** pain beginning ***. Pain is worse with ***.  Patient has deficits in *** ROM, *** LE flexibility, *** strength, ***abnormal posture, and TTP with abnormal muscle tension *** which are interfering with ADLs and are impacting quality of life.  On LEFS patient scored ***/80 demonstrating *** functional limitation.  Keonia Pasko will benefit from skilled PT to address above deficits to improve mobility and activity tolerance with decreased pain interference.  OBJECTIVE IMPAIRMENTS: {opptimpairments:25111}.   ACTIVITY LIMITATIONS: {activitylimitations:27494}  PARTICIPATION LIMITATIONS: {participationrestrictions:25113}  PERSONAL FACTORS: {Personal factors:25162} are also affecting patient's functional outcome.   REHAB POTENTIAL: {rehabpotential:25112}  CLINICAL DECISION MAKING: {clinical decision making:25114}  EVALUATION COMPLEXITY: {Evaluation complexity:25115}   GOALS: Goals reviewed with patient? {yes/no:20286}  SHORT TERM GOALS: Target date: ***  Patient will be independent with initial HEP. Baseline: *** Goal status: {GOALSTATUS:25110}  2.  Patient will report at least 25% improvement in R hip pain to improve QOL. Baseline: *** Goal status: {GOALSTATUS:25110}  3.  *** Baseline: *** Goal status: {GOALSTATUS:25110}  LONG TERM GOALS: Target date: ***  Patient will be independent with advanced/ongoing HEP to improve outcomes and carryover.  Baseline: *** Goal status: {GOALSTATUS:25110}  2.  Patient will report at least 50-75% improvement in R hip pain to improve QOL. Baseline: *** Goal status: {GOALSTATUS:25110}  3.  Patient will demonstrate improved *** strength to >/= ***/5 for improved stability and ease of mobility. Baseline: Refer to above LE MMT table Goal status: {GOALSTATUS:25110}  4.  Patient will be  able to ambulate 600' with LRAD and normal gait pattern without increased pain to access community.  Baseline: *** Goal status: {GOALSTATUS:25110}  5.  Patient will report >/= ***/80 on LEFS (MCID = 9 pts) to demonstrate improved functional ability. Baseline: *** Goal status: {GOALSTATUS:25110}  6.  Patient will demonstrate at least 19/24 on DGI to decrease risk of falls. Baseline: *** Goal status: {GOALSTATUS:25110}    PLAN:  PT FREQUENCY: {rehab frequency:25116}  PT DURATION: {rehab duration:25117}  PLANNED INTERVENTIONS: {rehab planned interventions:25118::97110-Therapeutic exercises,97530- Therapeutic [id number]- Neuromuscular re-education,97535- Self Rjmz,02859- Manual therapy,Patient/Family education}  PLAN FOR NEXT SESSION: ***   Elijah CHRISTELLA Hidden, PT 04/15/2024, 8:09 AM  "

## 2024-04-16 ENCOUNTER — Ambulatory Visit: Admitting: Physical Therapy

## 2024-04-16 LAB — DRUG MONITORING PANEL 375977 , URINE
Alcohol Metabolites: NEGATIVE ng/mL
Amphetamines: NEGATIVE ng/mL
Barbiturates: NEGATIVE ng/mL
Benzodiazepines: NEGATIVE ng/mL
Cocaine Metabolite: NEGATIVE ng/mL
Codeine: NEGATIVE ng/mL
Desmethyltramadol: NEGATIVE ng/mL
Hydrocodone: 732 ng/mL — ABNORMAL HIGH
Hydromorphone: 268 ng/mL — ABNORMAL HIGH
Marijuana Metabolite: NEGATIVE ng/mL
Morphine: NEGATIVE ng/mL
Norhydrocodone: 957 ng/mL — ABNORMAL HIGH
Opiates: POSITIVE ng/mL — AB
Oxycodone: NEGATIVE ng/mL
Tramadol: NEGATIVE ng/mL

## 2024-04-16 LAB — DM TEMPLATE

## 2024-04-17 ENCOUNTER — Ambulatory Visit: Payer: Self-pay | Admitting: Internal Medicine

## 2024-04-17 ENCOUNTER — Other Ambulatory Visit (HOSPITAL_BASED_OUTPATIENT_CLINIC_OR_DEPARTMENT_OTHER): Payer: Self-pay

## 2024-04-17 ENCOUNTER — Other Ambulatory Visit (HOSPITAL_COMMUNITY): Payer: Self-pay

## 2024-04-17 MED ORDER — POTASSIUM CHLORIDE CRYS ER 10 MEQ PO TBCR
10.0000 meq | EXTENDED_RELEASE_TABLET | Freq: Every day | ORAL | 0 refills | Status: AC
  Filled 2024-04-17: qty 10, 10d supply, fill #0

## 2024-04-19 NOTE — Telephone Encounter (Signed)
 Insurance updated from [employer] to [employer]. VOB resubmitted.

## 2024-04-21 ENCOUNTER — Telehealth: Payer: Self-pay | Admitting: Family

## 2024-04-21 ENCOUNTER — Other Ambulatory Visit (HOSPITAL_BASED_OUTPATIENT_CLINIC_OR_DEPARTMENT_OTHER): Payer: Self-pay

## 2024-04-21 ENCOUNTER — Other Ambulatory Visit: Payer: Self-pay | Admitting: Internal Medicine

## 2024-04-21 ENCOUNTER — Other Ambulatory Visit: Payer: Self-pay

## 2024-04-21 MED ORDER — HYDROCODONE-ACETAMINOPHEN 7.5-325 MG PO TABS
1.0000 | ORAL_TABLET | Freq: Three times a day (TID) | ORAL | 0 refills | Status: AC | PRN
  Filled 2024-04-21: qty 90, 30d supply, fill #0

## 2024-04-21 MED ORDER — PRAVASTATIN SODIUM 20 MG PO TABS
20.0000 mg | ORAL_TABLET | Freq: Every day | ORAL | 1 refills | Status: AC
  Filled 2024-04-21: qty 90, 90d supply, fill #0

## 2024-04-21 NOTE — Telephone Encounter (Signed)
 PDMP okay, Rx sent

## 2024-04-21 NOTE — Telephone Encounter (Signed)
 Requesting: hydrocodone   Contract:04/14/24 UDS:04/14/24 Last Visit: 04/14/24 Next Visit:08/12/24 Last Refill: 03/19/24 #90 and 0RF   Please Advise

## 2024-04-24 NOTE — Telephone Encounter (Signed)
 [employer] - Prior Authorization REQUIRED for [employer]

## 2024-04-29 NOTE — Telephone Encounter (Signed)
 [facility] Prior [employer] form printed for completion.

## 2024-05-06 NOTE — Telephone Encounter (Signed)
 [employer]  Prior Authorization form completed to fax to [facility]   Will print clinical documentation.

## 2024-05-06 NOTE — Telephone Encounter (Signed)
Prior auth form has been faxed 

## 2024-05-07 ENCOUNTER — Ambulatory Visit: Admitting: Orthopaedic Surgery

## 2024-05-11 ENCOUNTER — Other Ambulatory Visit: Payer: Self-pay | Admitting: Internal Medicine

## 2024-05-12 ENCOUNTER — Other Ambulatory Visit (HOSPITAL_BASED_OUTPATIENT_CLINIC_OR_DEPARTMENT_OTHER): Payer: Self-pay

## 2024-05-12 ENCOUNTER — Other Ambulatory Visit: Payer: Self-pay

## 2024-05-12 MED ORDER — FLUTICASONE-SALMETEROL 100-50 MCG/ACT IN AEPB
1.0000 | INHALATION_SPRAY | Freq: Two times a day (BID) | RESPIRATORY_TRACT | 5 refills | Status: AC
  Filled 2024-05-12: qty 60, 30d supply, fill #0

## 2024-05-13 ENCOUNTER — Other Ambulatory Visit (HOSPITAL_BASED_OUTPATIENT_CLINIC_OR_DEPARTMENT_OTHER): Payer: Self-pay

## 2024-05-13 NOTE — Telephone Encounter (Signed)
 [employer] Bill  Prior Authorization for [employer]  APPROVED PA# [id number] Valid: 05/06/24-04/09/25 2 visits

## 2024-05-15 NOTE — Telephone Encounter (Signed)
 [employer]  Patient is ready for scheduling on or after 05/10/24  Out-of-pocket cost due at time of visit: $705  Primary: [facility] Medicare Advantage  [employer]  co-insurance: 40% (approximately $705.12) Admin fee co-insurance: 0%  Deductible: does not apply  Prior Auth: APPROVED PA# [id number] Valid: 05/06/24-04/09/25 2 visits  Secondary: N/A [employer]  co-insurance:  Admin fee co-insurance:  Deductible:  Prior Auth:  PA# Valid:   ** This summary of benefits is an estimation of the patient's out-of-pocket cost. Exact cost may vary based on individual plan coverage.

## 2024-05-16 ENCOUNTER — Ambulatory Visit

## 2024-05-16 NOTE — Telephone Encounter (Signed)
 VOB re-submitted per pt request.

## 2024-05-23 ENCOUNTER — Ambulatory Visit

## 2024-06-02 ENCOUNTER — Ambulatory Visit: Admitting: Bariatrics

## 2024-08-12 ENCOUNTER — Ambulatory Visit: Admitting: Internal Medicine

## 2024-09-16 ENCOUNTER — Ambulatory Visit

## 2024-11-06 ENCOUNTER — Ambulatory Visit: Admitting: Internal Medicine

## 2024-12-03 ENCOUNTER — Encounter: Admitting: Obstetrics and Gynecology
